# Patient Record
Sex: Male | Born: 1935 | Race: White | Hispanic: No | Marital: Married | Smoking: Former smoker
Health system: Southern US, Community
[De-identification: ages and names within clinical notes are randomized; demographics above are authoritative.]

## PROBLEM LIST (undated history)

## (undated) DIAGNOSIS — I639 Cerebral infarction, unspecified: Secondary | ICD-10-CM

## (undated) DIAGNOSIS — Z7901 Long term (current) use of anticoagulants: Secondary | ICD-10-CM

## (undated) DIAGNOSIS — I4891 Unspecified atrial fibrillation: Secondary | ICD-10-CM

## (undated) DIAGNOSIS — C801 Malignant (primary) neoplasm, unspecified: Secondary | ICD-10-CM

## (undated) DIAGNOSIS — I10 Essential (primary) hypertension: Secondary | ICD-10-CM

## (undated) HISTORY — DX: Long term (current) use of anticoagulants: Z79.01

## (undated) HISTORY — DX: Unspecified atrial fibrillation: I48.91

## (undated) HISTORY — PX: APPENDECTOMY: SHX54

## (undated) HISTORY — PX: CARDIAC CATHETERIZATION: SHX172

## (undated) HISTORY — DX: Cerebral infarction, unspecified: I63.9

## (undated) HISTORY — DX: Essential (primary) hypertension: I10

---

## 2004-03-05 ENCOUNTER — Encounter: Admission: RE | Admit: 2004-03-05 | Discharge: 2004-03-05 | Payer: Self-pay | Admitting: Family Medicine

## 2004-11-10 ENCOUNTER — Encounter: Admission: RE | Admit: 2004-11-10 | Discharge: 2004-11-10 | Payer: Self-pay | Admitting: Family Medicine

## 2005-01-25 ENCOUNTER — Ambulatory Visit: Payer: Self-pay | Admitting: Physical Medicine & Rehabilitation

## 2005-01-25 ENCOUNTER — Inpatient Hospital Stay (HOSPITAL_COMMUNITY): Admission: RE | Admit: 2005-01-25 | Discharge: 2005-01-28 | Payer: Self-pay | Admitting: Orthopaedic Surgery

## 2005-03-21 ENCOUNTER — Encounter: Admission: RE | Admit: 2005-03-21 | Discharge: 2005-03-21 | Payer: Self-pay | Admitting: Family Medicine

## 2005-03-21 ENCOUNTER — Encounter (INDEPENDENT_AMBULATORY_CARE_PROVIDER_SITE_OTHER): Payer: Self-pay | Admitting: *Deleted

## 2005-03-21 ENCOUNTER — Ambulatory Visit (HOSPITAL_COMMUNITY): Admission: EM | Admit: 2005-03-21 | Discharge: 2005-03-22 | Payer: Self-pay | Admitting: General Surgery

## 2006-05-02 ENCOUNTER — Inpatient Hospital Stay (HOSPITAL_COMMUNITY): Admission: EM | Admit: 2006-05-02 | Discharge: 2006-05-09 | Payer: Self-pay | Admitting: Emergency Medicine

## 2006-05-03 ENCOUNTER — Encounter (INDEPENDENT_AMBULATORY_CARE_PROVIDER_SITE_OTHER): Payer: Self-pay | Admitting: Interventional Cardiology

## 2006-05-22 ENCOUNTER — Encounter: Admission: RE | Admit: 2006-05-22 | Discharge: 2006-08-20 | Payer: Self-pay | Admitting: Neurology

## 2007-07-11 ENCOUNTER — Encounter: Admission: RE | Admit: 2007-07-11 | Discharge: 2007-07-11 | Payer: Self-pay | Admitting: Family Medicine

## 2009-10-12 ENCOUNTER — Emergency Department (HOSPITAL_COMMUNITY): Admission: EM | Admit: 2009-10-12 | Discharge: 2009-10-12 | Payer: Self-pay | Admitting: Emergency Medicine

## 2010-03-25 LAB — PROTIME-INR
INR: 2.63 — ABNORMAL HIGH (ref 0.00–1.49)
Prothrombin Time: 28.2 seconds — ABNORMAL HIGH (ref 11.6–15.2)

## 2010-03-25 LAB — COMPREHENSIVE METABOLIC PANEL
ALT: 31 U/L (ref 0–53)
AST: 33 U/L (ref 0–37)
Albumin: 3.8 g/dL (ref 3.5–5.2)
Alkaline Phosphatase: 48 U/L (ref 39–117)
BUN: 10 mg/dL (ref 6–23)
CO2: 30 mEq/L (ref 19–32)
Calcium: 9 mg/dL (ref 8.4–10.5)
Chloride: 103 mEq/L (ref 96–112)
Creatinine, Ser: 0.86 mg/dL (ref 0.4–1.5)
GFR calc Af Amer: >60 mL/min (ref >60)
GFR calc non Af Amer: >60 mL/min (ref >60)
Glucose, Bld: 89 mg/dL (ref 70–99)
Potassium: 3.8 mEq/L (ref 3.5–5.1)
Sodium: 138 mEq/L (ref 135–145)
Total Bilirubin: 0.5 mg/dL (ref 0.3–1.2)
Total Protein: 7.6 g/dL (ref 6.0–8.3)

## 2010-03-25 LAB — POCT I-STAT, CHEM 8
BUN: 12 mg/dL (ref 6–23)
Calcium, Ion: 1.17 mmol/L (ref 1.12–1.32)
Chloride: 101 mEq/L (ref 96–112)
Creatinine, Ser: 0.9 mg/dL (ref 0.4–1.5)
Glucose, Bld: 91 mg/dL (ref 70–99)
HCT: 45 % (ref 39.0–52.0)
Hemoglobin: 15.3 g/dL (ref 13.0–17.0)
Potassium: 3.8 mEq/L (ref 3.5–5.1)
Sodium: 140 mEq/L (ref 135–145)
TCO2: 31 mmol/L (ref 0–100)

## 2010-03-25 LAB — POCT CARDIAC MARKERS
CKMB, poc: 3.1 ng/mL (ref 1.0–8.0)
CKMB, poc: 3.1 ng/mL (ref 1.0–8.0)
CKMB, poc: 4.1 ng/mL (ref 1.0–8.0)
Myoglobin, poc: 88.3 ng/mL (ref 12–200)
Myoglobin, poc: 98.1 ng/mL (ref 12–200)
Myoglobin, poc: 99.6 ng/mL (ref 12–200)
Troponin i, poc: <0.05 ng/mL (ref 0.00–0.09)
Troponin i, poc: <0.05 ng/mL (ref 0.00–0.09)
Troponin i, poc: <0.05 ng/mL (ref 0.00–0.09)

## 2010-03-25 LAB — CBC
HCT: 43.3 % (ref 39.0–52.0)
Hemoglobin: 14.6 g/dL (ref 13.0–17.0)
MCH: 30.5 pg (ref 26.0–34.0)
MCHC: 33.7 g/dL (ref 30.0–36.0)
MCV: 90.4 fL (ref 78.0–100.0)
Platelets: 145 10*3/uL — ABNORMAL LOW (ref 150–400)
RBC: 4.79 MIL/uL (ref 4.22–5.81)
RDW: 13.5 % (ref 11.5–15.5)
WBC: 4.9 10*3/uL (ref 4.0–10.5)

## 2010-05-01 ENCOUNTER — Emergency Department (HOSPITAL_COMMUNITY): Payer: Medicare Other

## 2010-05-01 ENCOUNTER — Emergency Department (HOSPITAL_COMMUNITY)
Admission: EM | Admit: 2010-05-01 | Discharge: 2010-05-01 | Disposition: A | Discharge status: Home / Self Care | Payer: Medicare Other | Attending: Emergency Medicine | Admitting: Emergency Medicine

## 2010-05-01 DIAGNOSIS — IMO0002 Reserved for concepts with insufficient information to code with codable children: Secondary | ICD-10-CM | POA: Insufficient documentation

## 2010-05-01 DIAGNOSIS — Z7982 Long term (current) use of aspirin: Secondary | ICD-10-CM | POA: Insufficient documentation

## 2010-05-01 DIAGNOSIS — H5710 Ocular pain, unspecified eye: Secondary | ICD-10-CM | POA: Insufficient documentation

## 2010-05-01 DIAGNOSIS — Y93H2 Activity, gardening and landscaping: Secondary | ICD-10-CM | POA: Insufficient documentation

## 2010-05-01 DIAGNOSIS — Y9229 Other specified public building as the place of occurrence of the external cause: Secondary | ICD-10-CM | POA: Insufficient documentation

## 2010-05-01 DIAGNOSIS — Z79899 Other long term (current) drug therapy: Secondary | ICD-10-CM | POA: Insufficient documentation

## 2010-05-01 DIAGNOSIS — S0003XA Contusion of scalp, initial encounter: Secondary | ICD-10-CM | POA: Insufficient documentation

## 2010-05-01 DIAGNOSIS — S02109A Fracture of base of skull, unspecified side, initial encounter for closed fracture: Secondary | ICD-10-CM | POA: Insufficient documentation

## 2010-05-01 DIAGNOSIS — I10 Essential (primary) hypertension: Secondary | ICD-10-CM | POA: Insufficient documentation

## 2010-05-01 DIAGNOSIS — R1110 Vomiting, unspecified: Secondary | ICD-10-CM | POA: Insufficient documentation

## 2010-05-01 DIAGNOSIS — S0010XA Contusion of unspecified eyelid and periocular area, initial encounter: Secondary | ICD-10-CM | POA: Insufficient documentation

## 2010-05-01 DIAGNOSIS — W010XXA Fall on same level from slipping, tripping and stumbling without subsequent striking against object, initial encounter: Secondary | ICD-10-CM | POA: Insufficient documentation

## 2010-05-01 DIAGNOSIS — Z7901 Long term (current) use of anticoagulants: Secondary | ICD-10-CM | POA: Insufficient documentation

## 2010-05-01 DIAGNOSIS — R220 Localized swelling, mass and lump, head: Secondary | ICD-10-CM | POA: Insufficient documentation

## 2010-05-01 LAB — CBC
Hemoglobin: 14.9 g/dL (ref 13.0–17.0)
MCH: 30.5 pg (ref 26.0–34.0)
MCHC: 34.4 g/dL (ref 30.0–36.0)
Platelets: 141 10*3/uL — ABNORMAL LOW (ref 150–400)
RDW: 13.7 % (ref 11.5–15.5)

## 2010-05-01 LAB — POCT I-STAT, CHEM 8
BUN: 19 mg/dL (ref 6–23)
Calcium, Ion: 1.16 mmol/L (ref 1.12–1.32)
Chloride: 104 mEq/L (ref 96–112)
Glucose, Bld: 178 mg/dL — ABNORMAL HIGH (ref 70–99)
HCT: 46 % (ref 39.0–52.0)
Hemoglobin: 15.6 g/dL (ref 13.0–17.0)
Potassium: 4 mEq/L (ref 3.5–5.1)
TCO2: 27 mmol/L (ref 0–100)

## 2010-05-01 LAB — DIFFERENTIAL
Basophils Absolute: 0 10*3/uL (ref 0.0–0.1)
Basophils Relative: 0 % (ref 0–1)
Eosinophils Absolute: 0 10*3/uL (ref 0.0–0.7)
Eosinophils Relative: 0 % (ref 0–5)
Monocytes Absolute: 0.3 10*3/uL (ref 0.1–1.0)
Monocytes Relative: 4 % (ref 3–12)
Neutro Abs: 6.7 10*3/uL (ref 1.7–7.7)

## 2010-05-01 LAB — PROTIME-INR
INR: 2.31 — ABNORMAL HIGH (ref 0.00–1.49)
Prothrombin Time: 25.5 seconds — ABNORMAL HIGH (ref 11.6–15.2)

## 2010-05-28 NOTE — Consult Note (Signed)
[name] NO.:  [id number]   MEDICAL RECORD NO.:  [id number]          PATIENT TYPE:  INP   LOCATION:  3708                         FACILITY:  [facility]   PHYSICIAN:  Lyn Records, M.D.   DATE OF BIRTH:  1930-08-14   DATE OF CONSULTATION:  05/03/2006  DATE OF DISCHARGE:                                 CONSULTATION   REASON FOR CONSULTATION:  New-onset atrial fibrillation, elevated  cardiac markers.   CONCLUSION:  1. Paroxysmal atrial fibrillation with spontaneous reversion after      initiation of calcium channel and beta blocker therapy May 02, 2006.      a.     Atrial fibrillation associated with severe dyspnea, chest       pain and elevated cardiac markers with peak troponin of 0.1 and       mildly elevated CK-MBs.  2. Elevated cardiac markers as described above in the setting of      atrial fibrillation.  3. Probable new-onset angina.  The patient gives a 61-month history of      exertional midsternal tightness that is relieved by rest.  4. History of hypertension, degree of control was uncertain.  5. Diverticulosis.  6. Prior history of hip surgery.   RECOMMENDATIONS:  1. Agree with serial markers  2. Echocardiogram to rule out pericardial disease, hypertrophic      cardiomyopathy or severe LVH, other potential structural      abnormalities that could lead to atrial fibrillation.  This test      will also be helpful to exclude regional wall motion abnormality.  3. Given the scenario, ie., antecedent history of exertional angina,      angina with atrial fibrillation and rapid rate, and abnormal      cardiac markers, I believe coronary angiography is most direct way      to evaluate the patient's coronary system and to help guide      therapy.  4. Agree with beta blocker therapy.  5. Agree with antithrombotic therapy until further data is available.      At the least the patient will be discharged home on aspirin and we      may decide  to use Coumadin therapy after we evaluate his coronaries      depending upon findings.  6. Blood pressure control with optimization of beta blocker and      diuretic therapy.  7. Statin therapy for management of hyperlipidemia.   COMMENT:  The patient is 14 and was admitted to the hospital on May 02, 2006 because of the sudden onset of chest discomfort that was  severe.  This occurred while he was at [facility] right after he had  eaten.  He has had several types of chest discomfort over the past 3  months.  This discomfort was a new type discomfort and did not have  relief with antacid therapy.  This therefore caused him to go to the  office of Dr. Manus Gunning where an EKG documented that he was in atrial  fibrillation  with a rapid rate.  He states that while in the process of  getting to Dr. Randel Books office he became extremely short of breath and  was having pain in excess of 10 of 10.  He did not feel better until  sometime last evening after intravenous therapies were started to slow  his heart rate.  He is painfree now and had no recurrence of discomfort  in the chest after the therapies were started.   Other types of discomfort that the patient has had includes a burning  discomfort that usually is relieved by TUMS.  TUMS did not work  yesterday.  These episodes are not usually exertional related.   He has also been recently having a discomfort in chest that he describes  as a tightness and is brought on by heavy exertion.  This has been  occurring only for 3 months.  Two specific episodes have occurred with  moderate to heavy exertion including one episode occurring while  changing a tire.  If he rests the discomfort resolves after several  minutes.  He has never had this discomfort to occur at rest.   MEDICATIONS ON ADMISSION:  1. Aspirin 81 mg per day.  2. Calcium 600 mg twice day.  3. Maxzide 37.5/25 mg daily.  4. Multivitamin one per day.   ALLERGIES:  None  known.   SIGNIFICANT MEDICAL PROBLEMS:  1. Hypertension.  2. Diverticulitis.  3. Indigestion.  4. Degenerative joint disease.  5. No previous surgeries.   FAMILY HISTORY:  No significant history of coronary atherosclerosis in  family history.  The patient's mother died of cerebral aneurysm.  He  does not know any of the history on his father.   REVIEW OF SYSTEMS:  The patient has not had orthopnea, PND, but he has  been having exertional chest tightness.  He sleeps on two pillows.  He  has never fainted, he has never had tachy palpitations, did not have  tachy palpitations with the presenting complaints.   PHYSICAL EXAMINATION:  GENERAL:  The patient in no acute distress.  He  is lying comfortably in bed. There is no tachypnea.  VITAL SIGNS:  Blood pressure is 115/63, heart rate 57.  NECK:  Neck veins not distended.  No carotid bruits.  LUNGS:  Clear to auscultation and percussion.  HEART:  No gallop, rub, click, murmur.  ABDOMEN:  Soft.  Bowel sounds normal.  EXTREMITIES:  No edema.  Femoral pulses 2+, posterior tibial pulses 2+,  radial pulses 2+.  NEUROLOGICAL:  Exam reveals no motor or sensory deficits.   EKG during atrial fibrillation reveals an extremely rapid rate of 160-  170 beats per minute with marked ST abnormality.  EKG post conversion  reveals normal sinus rhythm.  No ischemic ST-T wave changes.   LABORATORY DATA:  Include BUN and creatinine of 15 and 1.15, potassium  3.6.  BNP 44.  Initial troponin I 0.0, second 0.10, third 0.06.  Initial  CK-MB 6.4, second 4.6. Hemoglobin 13.6, platelet count 129. Lipid  profile has not been performed.   DISCUSSION:  The patient now feels great.  He was admitted with atrial  fib with rapid rate.  The atrial fibrillation resolved spontaneously  after initiation of beta blocker and calcium channel blocker therapy. The positive enzymes could be related to increase demand in a patient  with a history of hypertension, possible LVH  and demand/supply mismatch.  However, given the patient's history of exertional angina for 3 months  prior to  this.  I am concerned he may have underlying significant  coronary disease.  I believe the most direct way to evaluate the  patient's coronary anatomy and help establish treatment plan is to  perform coronary angiography.   At this point the patient's echo is structurally normal and we do not  find significant coronary disease.  I do not think that Coumadin therapy  will be indicated.  If he redevelops atrial fibrillation on beta blocker  therapy with good blood pressure control then long-term Coumadin therapy  would be necessary.      Lyn Records, M.D.  Electronically Signed     HWS/MEDQ  D:  05/03/2006  T:  05/03/2006  Job:  [id number]   cc:   Bryan Lemma. Manus Gunning, M.D.

## 2010-05-28 NOTE — Cardiovascular Report (Signed)
NAMEPAXTON, BINNS NO.:  [id number]   MEDICAL RECORD NO.:  [id number]          PATIENT TYPE:  INP   LOCATION:  3708                         FACILITY:  [facility]   PHYSICIAN:  Lyn Records, M.D.   DATE OF BIRTH:  02/29/1930   DATE OF PROCEDURE:  05/04/2006  DATE OF DISCHARGE:                            CARDIAC CATHETERIZATION   INDICATIONS FOR PROCEDURE:  The patient presented with severe chest  discomfort and atrial fibrillation with rapid ventricular response; and  ultimately had mild elevation in cardiac markers.  For the 3 months  preceding this particular episode, he had at least two episodes of  exertional chest tightness that was relieved with rest.  His underlying  risk factors include age, hypertension, and hyperlipidemia.   PROCEDURE PERFORMED:  1. Left heart cath.  2. Selective coronary angiogram.  3. Left ventriculography.   DESCRIPTION:  After informed consent a 6-French sheath was placed in the  right femoral artery using the modified Seldinger technique. A 6-French  A2 multipurpose catheter was then used for hemodynamic recordings, left  ventriculography by hand injection, and selective left-and-right  coronary angiography.  We were unable to cannulate the right coronary  primarily because of tortuosity in the iliac that prevented catheter  torquing.  We tried a no-torque right catheter through the stand-alone  sheath and this would not engage in the right coronary.  We ultimately  exchanged the stand-alone sheath for a 20-cm long sheath to get above  the tortuosity in the iliac. This allowed catheter torquing; and the  right coronary was easily cannulated with a Judkins right 6-French  catheter.  The patient tolerated the procedure without complications; 2  mg of IV Versed was administered for sedation before beginning the  procedure.  No complications occurred.   RESULTS   I. HEMODYNAMIC DATA:  A.  Aortic pressure 116/51 mmHg  B.  Left  ventricular pressure 116/10 mmHg.   II. LEFT VENTRICULOGRAPHY:  Left ventricular cavity size and systolic  function are normal.  Ejection fraction is 65%.  No mitral regurgitation  is noted.   III. CORONARY ANGIOGRAPHY:  A.  LEFT MAIN CORONARY:  The left main is a  large vessel widely patent; no evidence of plaquing and no calcification  noted.  B.  LEFT ANTERIOR DESCENDING CORONARY:  The LAD is ectatic appearing in  the proximal segment.  Just prior to the origin of the first septal  perforator and diagonal, there is an eccentric less than 20% lesion.  The LAD is large, wraps around left ventricular apex.  The large first  diagonal is without evidence of plaquing or obstruction.  C.  CIRCUMFLEX ARTERY:  The circumflex coronary artery is normal.  It  gives origin to a large first obtuse marginal that trifurcates on the  left lateral wall.  The vessel contains moderate tortuosity.  The  continuation of circumflex was large giving origin to a small distal  circumflex marginal, and a large left atrial recurrent arises from the  distal circumflex as well.  The entire circumflex system is widely  patent.  D.  RIGHT CORONARY:  The right coronary artery was initially difficult  to cannulate because of difficulty with torquing.  The mid vessel  contains less than 30% plaquing that is eccentric.  The PDA is large.  Two left ventricular branches arise from the right coronary as above.  The right coronary is essentially normal.   CONCLUSIONS:  1. Widely patent coronaries without any evidence of significant      obstructive disease.  Minimal plaquing is noted.  2. Normal left ventricular function.  3. Mild ectasia in the proximal left anterior descending.   PLAN:  1. The patient's medication regimen should be changed to include beta      blocker therapy to slow rate if he develops recurrent atrial      fibrillation.  2. His blood pressure regimen should contain ACE inhibitor, ARB       therapy, which may have some protective effect against      redevelopment of atrial fibrillation.  3. The patient's risk for systemic embolization is relatively low; and      after this one brief episode of atrial fibrillation, I believe,      aspirin therapy is reasonable.  If he has recurrent atrial      fibrillation, Coumadin should be started.      Lyn Records, M.D.     HWS/MEDQ  D:  05/04/2006  T:  05/04/2006  Job:  [id number]   cc:   Bryan Lemma. Manus Gunning, M.D.

## 2010-05-28 NOTE — H&P (Signed)
[name], [name]                 ACCOUNT NO.:  [id number]   MEDICAL RECORD NO.:  [id number]          PATIENT TYPE:  OIB   LOCATION:  6735                         FACILITY:  [facility]   PHYSICIAN:  Adolph Pollack, M.D.DATE OF BIRTH:  1930-05-15   DATE OF ADMISSION:  03/21/2005  DATE OF DISCHARGE:                                HISTORY & PHYSICAL   CHIEF COMPLAINT:  Abdominal pain.   HISTORY OF PRESENT ILLNESS:  Mr. Bedel is a 77 year old male who awoke  yesterday with some upper abdominal/lower chest wall pain, pressure type,  eventually radiated down to the lower abdomen and became worse. He forced  himself to have a BM but did not get relief. He continued to have the pain  and presented to Dr. Manus Gunning, who evaluated him and noted he had a white  cell count of 22,000. He was sent for a CT scan of an abdomen as an  outpatient and noted to have findings consistent with appendicitis. I was  subsequently asked to see him. He denies nausea, vomiting, fever, or chills.   PAST MEDICAL HISTORY:  1.  Degenerative joint disease.  2.  He states he is taking an antihypertensive but not sure if he is      hypertensive or not.   PREVIOUS OPERATIONS:  1.  Right hip replacement.  2.  Left thumb surgery.   ALLERGIES:  None.   MEDICATIONS:  Triamterene/hydrochlorothiazide, baby aspirin a day,  glucosamine and chondroitin, calcium, vitamins.   SOCIAL HISTORY:  He is a former smoker. Denies alcohol use. Married and with  his wife. Still tries to do some work at times.   REVIEW OF SYSTEMS:  CONSTITUTIONAL:  No weight loss. CARDIOVASCULAR:  Denies  any heart disease or chest pain. PULMONARY:  Denies shortness of breath,  pneumonia, COPD, or asthma. GI:  Denies peptic ulcer disease, hepatitis,  diverticulitis, hematochezia. GU:  He denies any kidney stones. Occasionally  he has to strain to void at times. ENDOCRINE:  Denies diabetes, thyroid  disease, or hypercholesterolemia. NEUROLOGIC:   Denies stroke or seizures.  HEMATOLOGIC:  Denies any bleeding disorders or blood clots. He is not sure  if he has had a transfusion in the past or not.   PHYSICAL EXAMINATION:  GENERAL:  Well-developed, well-nourished male.  Appears to be slightly uncomfortable.  VITAL SIGNS:  He is afebrile, normal blood pressure and heart rate at  present.  EYES:  Extraocular muscles intact, no icterus. He does wear glasses.  NECK:  Supple without masses or obvious thyroid enlargement.  RESPIRATORY:  Breath sounds equal and clear, respirations unlabored.  CARDIOVASCULAR:  Demonstrates a regular rate and rhythm. I do not hear a  murmur.  ABDOMEN:  Soft. There is right lower quadrant tenderness and guarding to  palpation and percussion. No obvious mass. Hypoactive bowel sounds noted.  EXTREMITIES:  He has got good range of motion. No clubbing, cyanosis, or  edema.  SKIN:  No jaundice.   DATA:  Remarkable for white cell count of 22,000. CT scan was reviewed.   IMPRESSION:  Acute appendicitis.  PLAN:  Laparoscopic, possible open appendectomy. I did discuss the  procedure, rationale, and risks with him. The risks include but not limited  to bleeding, infection, would healing problems, anesthesia, accidental  damage to intraabdominal organs. He seemed to understand this and agrees to  proceed.      Adolph Pollack, M.D.  Electronically Signed     TJR/MEDQ  D:  03/21/2005  T:  03/22/2005  Job:  [id number]   cc:   Bryan Lemma. Manus Gunning, M.D.  Fax: [phone]

## 2010-05-28 NOTE — Op Note (Signed)
NAMEEINAR, NOLASCO NO.:  [id number]   MEDICAL RECORD NO.:  [id number]          PATIENT TYPE:  INP   LOCATION:  2854                         FACILITY:  [facility]   PHYSICIAN:  Vanita Panda. Magnus Ivan, M.D.DATE OF BIRTH:  08-01-1930   DATE OF PROCEDURE:  01/25/2005  DATE OF DISCHARGE:                                 OPERATIVE REPORT   PREOPERATIVE DIAGNOSIS:  Right hip severe osteoarthritis/degenerative joint  disease.   POSTOPERATIVE DIAGNOSIS:  Right hip severe osteoarthritis/degenerative joint  disease.   PROCEDURE:  Right total hip arthroplasty.   COMPONENTS:  DePuy Summit tapered hip stem with hydroxyapatite coating size  7 high offset, Pinnacle acetabular cup size 56, polyethylene Pinnacle  Marathon acetabular liner with 10 degree hood, size 36 metal femoral head (-  2).   SURGEON:  Vanita Panda. Magnus Ivan, MD   ANESTHESIA:  General.   ANTIBIOTICS:  1 gram IV Ancef.   BLOOD LOSS:  400 mL.   COMPLICATIONS:  None.   INDICATIONS:  Briefly Mr. Toney is a very pleasant 77 year old with known  severe osteoarthritis involving his right hip.  He had no discernible leg  length discrepancy but with the pain in his hip, it has certainly affected  his activities of daily living.  He has failed conservative treatment with  anti-inflammatories and x-rays showed severe acetabular and femoral head  wear and it was recommended that he undergo total hip replacement.  He did  want to proceed with surgery because he is quite an active individual and  this is certainly hurting him on a daily basis at this point.  The risks,  benefits have been explained him and well understood and he was cleared from  a medical standpoint to proceed with surgery. I did explain the risks of  blood clots, pulmonary embolus, blood loss, nerve injury and even death and  he agreed to proceed with surgery.   PROCEDURE DESCRIPTION:  After informed consent was obtained and appropriate  right leg was marked, Mr. Chiquito was brought to the operating room and  placed supine on the operating table. General anesthesia was obtained. The  knee was turned to a lateral decubitus position with a right hip up and  appropriate padding was placed with an axillary roll as well as padding  underneath his down left leg at the knee and ankle.  His operative leg was  then prepped from above the hip down to the ankle with DuraPrep and then  sterile stockinette and sterile drapes were utilized.  Of note hip  positioners were used in the front and back with appropriate padding as  well.  A lateral incision was made just over the level of the greater  trochanter and __________ anteriorly for an anterior lateral approach to the  hip.  After the skin and soft tissue were divided, the iliotibial band and  tensor fascia lata were identified. These were then divided sharply directly  over the greater trochanter to reveal the vastus lateralis and the vastus  ridge.  The gluteus medius was likewise identified.  The gluteus medius was  then  taken off the vastus ridge of the most distal one third of the gluteus  medius and minimus.  Those were taken and reflected anteriorly as a sleeve  of tissue with separate tendons.  The hip capsule was then encountered and  was divided in a T-type fashion revealing abundant osteophytes and there is  certainly an effusion of the joint. There was certainly difficult to  dislocate his joint secondary to soft tissue contractures, so I made a very  high neck cut using Homan retractors for protection to expose the head.  A  corkscrew was then utilized to remove the head in its entirety.  The  acetabulum was then cleaned of debris and then I was able to make separate  and neck approximately a fingerbreadth proximal to the lesser trochanter.  The acetabulum was then again cleaned of debris and a sharp knife was used  to remove the labrum.  The acetabulum was then reamed in  5-mm increments up  to 55 mm.  I felt that I got concentric reaming and a good rim fit, so I  chose a Pinnacle marathon acetabular liner with porous coating and was able  to hammer this into place.  I used guides to gain my anteversion as well as  to allow for the cup placement to be in slightly less than a 45 degrees  angle.  I trial liner was then placed in the acetabular cup and then I was  able to bring the leg off the table in a flexed and externally rotated  position and put this in a leg bag to allow for exposure of the femur and  canal finder was then used and an initiating reamer to open up the femoral  canal.  A box-cutter was likewise used and then the femoral canal was reamed  using first the size 01 reamer, then 1-2 followed by the 2-3 reamer all the  way up to the size 7-8 reamer. I then lateralized this with a lateralizing  reamer and then started with a #1 broach and broached up to a size 7. Once I  got a good fit with the size 7 broach and this was a solid fit, I used a  calcar planer to plane at the level of the neck cut.  I then used a high  offset neck and a standard 36 size ball and reduced the trial components and  I placed the hip through range of motion and this was found to be stable in  flexion, extension,  internal and external rotation and not tight.  It had  good shuck.  These were the components that I chose and all the trial  components were removed. I then copiously irrigated the hip socket and joint  and wound with pulsatile lavage using 3 liters of pulsatile lavage solution.  The leg was then brought off the table and the final polyethylene acetabular  liner was inserted. I next inserted the real size 7 Summit tapered stem it  was fully porous coated with hydroxyapatite coating.  I pounded this into  place with a hammer and got a good fit.  This stem was slightly proud so I used the next size down ball and a reduced the hip and put it through a  range of  motion and thus I was able to finally select the final components,  a 36 mm -2 size ball.  Once the real cobalt chrome ball was placed. the hip  was reduced again put through  range of motion found to be stable. I then  irrigated the wound again, the hip capsule was closed with interrupted #1  Ethibond suture followed by reapproximating the gluteus medius and minimus  tendons to the vastus ridge and the greater trochanter using drill holes and  #1 Ethibond suture as well. The IT band was then closed with interrupted #1  Vicryl suture followed by 2-0 Vicryl in interrupted form in the subcutaneous  tissue and staples on the skin.  A well-padded sterile dressing was applied.  Prior to final closure, a medium Hemovac drain was placed. The patient was  then rolled into supine position. The leg lengths were felt to be near equal  as well. The patient was awakened, extubated and taken to recovery room in  stable condition. There were no complications and final counts were correct.           ______________________________  Vanita Panda. Magnus Ivan, M.D.     CYB/MEDQ  D:  01/25/2005  T:  01/26/2005  Job:  [id number]

## 2010-05-28 NOTE — H&P (Signed)
[name], [name]                 ACCOUNT NO.:  [id number]   MEDICAL RECORD NO.:  [id number]          PATIENT TYPE:  INP   LOCATION:  3708                         FACILITY:  [facility]   PHYSICIAN:  Michelene Gardener, MD    DATE OF BIRTH:  09/06/1930   DATE OF ADMISSION:  05/02/2006  DATE OF DISCHARGE:                              HISTORY & PHYSICAL   PRIMARY CARE PHYSICIAN:  Dr. Blair Heys.   CHIEF COMPLAINT:  Chest pain.   HISTORY OF PRESENT ILLNESS:  This is a 77 year old Caucasian male with  past medical history of hypertension presented with the above admission  complaint.  This patient has been in his regular state of health until  today when he started developing chest pain.  The chest pain was  nonspecific, described as pressure like pain which sometimes becomes  sharp.  It started in the left side of his chest and was moving across  his chest to the right side.  He had some radiation to his right  shoulder and right part of his forehead.  When it started it was very  severe and it was around 10/10.  It was associated with mild shortness  of breath and sweating.  He denied nausea, there were no palpitations,  and there is no vomiting.  He went to his primary physicians office  where an EKG was done on him and it showed atrial fibrillation with  rapid ventricular response which is new onset so the patient was sent to  the ER for further evaluation.  In the ER, he was given 20 mg IV of  Cardizem and then was started on a Cardizem drip.  Currently his rate is  better controlled and it is around 80s on 5 mcg of Cardizem drip.   PAST MEDICAL HISTORY:  1. Hypertension.  2. History of diverticulosis.   PAST SURGICAL HISTORY:  Denied.   ALLERGIES:  No known drug allergies.   CURRENT MEDICATIONS:  1. Aspirin 81 mg p.o. daily.  2. Calcium 600 mg p.o. twice daily.  3. Maxzide 37.5/25 mg 1 tablet p.o. daily.  4. Multivitamin 1 tablet p.o. daily.   SOCIAL HISTORY:  The patient  denies smoking; he quit smoking more than  50 years ago.  He denies alcohol and he denies recreational drugs.   FAMILY HISTORY:  His mother died of brain aneurysm.  There is no history  of coronary artery disease.   REVIEW OF SYSTEMS:  CONSTITUTIONAL:  Notable for fatigability.  EYES:  No blurred vision.  ENT:  No tinnitus is noted.  RESPIRATORY:  No cough,  no wheezes.  CARDIOVASCULAR:  Positive for chest pain and shortness of  breath.  GI:  No nausea, no vomiting, no diarrhea.  GU:  No dysuria and  no hematuria.  ENDOCRINE:  No polyuria and no nocturia.  HEMATOLOGY:  No  bruises, no bleeding.  ID:  No rash, no lesions.  NEURO:  No numbness or  tingling.  The rest of the symptoms are reviewed and they were negative.   PHYSICAL EXAMINATION:  VITAL SIGNS:  Temperature  is 98.6, blood pressure  is 108/56, pulse 56, respiratory rate 20.  GENERAL APPEARANCE:  This is an elderly Caucasian male who is not in  acute distress.  HEENT:  Conjunctivae showed no pallor and no erythema.  Pupils equal,  round and reactive to light and accommodation.  There is no ptosis.  Hearing is intact.  There is no ear discharge or infection.  There is no  nose discharge, infection or bleeding.  Oral mucosa is dry, no  pharyngeal erythema.  NECK:  Supple, no JVD, no carotid bruits, no lymphadenopathy, no  thyromegaly enlargement, no tenderness.  CARDIOVASCULAR:  S1 and S2 are irregular.  There are no murmurs, no  gallops, and no thrills.  RESPIRATORY:  The patient is breathing 16-18.  There is no use of  accessory muscles, no intercostal retractions, no dullness, no rales, no  rhonchi, and no wheezes.  ABDOMEN:  The abdomen is soft, nondistended, no tenderness, no  hepatosplenomegaly, positive bowel sounds, umbilicus is central.  LOWER EXTREMITIES:  No edema, no rash, and no varicose veins.  SKIN:  No rash and no erythema.  NEURO:  Cranial nerves are intact from II-XII.  There are no motor or  sensory  deficits.   LABORATORY DATA:  Sodium 132, potassium 3.6, chloride 103, BUN 18,  creatinine 1.3.  CK-MB is 4.2 and troponin less than 0.5.  WBC 7.8,  hemoglobin 14.3, hematocrit 41.2, MCV 87.6, platelet count is 175.  EKG  is a-fib with RVR.  No evidence of acute ischemia.  Chest x-ray showed  no acute disease.   IMPRESSION AND ASSESSMENT:  1. Chest pain.  Most likely  from the patient because of the atrial      fibrillation.  I will admit this patient to the telemetry floor to      rule out myocardial infarction.  I will get three sets of troponin      and cardiac enzymes.  I will start him on aspirin and beta-blocker.      I will also get an echocardiogram to assess any possible wall      motion abnormalities.  Will get Cardiology consultation in the      morning for possible stress test before discharge.  2. History of atrial fibrillation with rapid ventricular response.      The patient was started on a Cardizem drip.  Currently his heart      rate is stable.  I will try to go without his Cardizem drip.      Meanwhile, will start him on metoprolol b.i.d.  I will also      anticoagulate this patient; we will start him on heparin IV.  Will      also start him on Coumadin p.o. and dose to be regulated by      pharmacy for dosing.  The target INR will be 2 to 3.  Will get      three sets of troponin and cardiac enzymes to rule out any      underlying ischemic cause for his a-fib.  We will also get an      echocardiogram for further evaluation.  3. Hypertension.  Will continue Maxzide and will continue him on      metoprolol and follow blood pressures.  4. Degenerative joint disease.  Will continue pain medication.   TOTAL ASSESSMENT TIME:  One hour.      Michelene Gardener, MD  Electronically Signed     NAE/MEDQ  D:  05/02/2006  T:  05/03/2006  Job:  [id number]   cc:   Bryan Lemma. Manus Gunning, M.D.

## 2010-05-28 NOTE — Consult Note (Signed)
[name], [name] NO.:  [id number]   MEDICAL RECORD NO.:  [id number]          PATIENT TYPE:  INP   LOCATION:  3708                         FACILITY:  [facility]   PHYSICIAN:  Lyn Records, M.D.   DATE OF BIRTH:  04-Aug-1930   DATE OF CONSULTATION:  DATE OF DISCHARGE:                                 CONSULTATION   Audio too short to transcribe (less than 5 seconds)      Lyn Records, M.D.     HWS/MEDQ  D:  05/03/2006  T:  05/03/2006  Job:  [id number]

## 2010-05-28 NOTE — Discharge Summary (Signed)
[name]                 ACCOUNT NO.:  [id number]   MEDICAL RECORD NO.:  [id number]          PATIENT TYPE:  INP   LOCATION:  3708                         FACILITY:  [facility]   PHYSICIAN:  Kela Millin, M.D.DATE OF BIRTH:  September 14, 1930   DATE OF ADMISSION:  05/02/2006  DATE OF DISCHARGE:                               DISCHARGE SUMMARY   DISCHARGE DIAGNOSES:  1. Atrial fibrillation with rapid ventricular response, spontaneously      converted to normal sinus rhythm.  2. Left posterior cerebral artery embolic infarct, secondary to #1.  3. Hypertension.  4. History of degenerative joint disease.   PROCEDURES AND STUDIES:  1. Cardiac catheterization per Lyn Records, M.D., on May 04, 2006.  Widely patent coronaries without any evidence of significant      obstructive disease.  Minimal plaquing noted.  Normal left      ventricular function.  Mild ectasia in the proximal left anterior      descending.  2. CT scan of the head.  No acute abnormality.  3. MRI of the brain.  Acute ischemic area involving the left posterior      cerebral artery distribution.  Probably small vessel-type disease      changes supratentorially, and also in the occipital regions.  Old      lacune involving the right caudate head and also possibly the left      putamen.  Mild to moderate thickening of the mucosa in the ethmoid      and the frontal sinuses.  MRA shows truncation of the distal left      posterior cerebral artery at the P2-P3 region, probably the      etiology of the patient's ischemic infarct.  No aneurysm seen.   CONSULTATIONS:  Cardiology, [facility], Dr. Verdis Prime.   HISTORY:  The patient is a 77 year old white male with past medical  history significant for hypertension, who presented with complaints of  chest pain.  The chest pain was described as a pressure-like pain which  was sometimes sharp, left precordial initially but also moving across  his chest to the right side at  times.  He had some radiation to his  right shoulder and the right part of his forehead.  He stated that it  was associated with shortness of breath and sweating and that it was  about a 10/10 in intensity at its worst.  The patient denied nausea,  palpitations, and no vomiting.  The patient went to his primary care  physician's office and an EKG there showed atrial fibrillation with a  rapid ventricular response, new-onset, and the patient was sent to the  ER.  In the ER the patient received a 20 mg IV bolus of Cardizem and  then was started on the drip.   PHYSICAL EXAMINATION:  VITAL SIGNS:  His physical exam upon admission as  per Dr. Arthor Captain revealed a temperature of 98.6 with a blood pressure of  108/56, pulse of 56, respiratory rate of 20.   The pertinent findings on exam:  NECK:  No  carotid bruits.  No JVD.  Supple.  No adenopathy and no  thyromegaly.  CARDIOVASCULAR:  Irregular and normal S1, S2.   The rest of his physical exam was reported to be within normal limits.   LABORATORY DATA:  Sodium 132 with a potassium of 3.6, chloride 103, BUN  18, creatinine 1.3.  His point of care markers were negative, white cell  count of 7.8, hemoglobin of 14.3, hematocrit of 41.2, MCV 87.6, platelet  count of 176.  An EKG showed atrial fibrillation with RVR and no  evidence of acute ischemia.  Chest x-ray showed no acute disease.   HOSPITAL COURSE:  Problem 1.  ATRIAL FIBRILLATION WITH RAPID VENTRICULAR RESPONSE:  As  discussed above, upon admission the patient was placed on a Cardizem  drip after he received a bolus.  Serial cardiac enzymes were done and  his troponins were noted to be elevated at 0.06 initially and then 0.1  and third set 0.08.  The patient had a 2 D echo done and it showed an  ejection fraction of 65%, overall left ventricular systolic function  normal, and no left ventricular regional wall motion abnormalities  noted.  While on the Cardizem drip, the patient  spontaneously converted  to normal sinus rhythm.  The patient was placed on oral Cardizem as well  as metoprolol.  The patient was also placed on anticoagulation for  atrial fibrillation upon admission.  Cardiology was consulted for  further recommendations and Dr. Katrinka Blazing saw the patient and a cardiac  catheterization was done on May 04, 2006, and the results are as  stated above:  Normal coronaries.  Following the procedure, the patient  developed numbness in his right leg and arm and also some slurred speech  as well as some clumsiness of his right.  A CT scan of his head was  ordered and it was negative.  An MRI was done, which revealed the  posterior circulation infarct as above.  The patient was switched from  Lovenox therapeutic dose to IV heparin once he developed the symptoms  and neurology was consulted and saw the patient and followed him while  in the hospital. PT/OT were also consulted and followed the patient  during his hospital stay.  Cardiology continued to follow the patient  and recommended discontinuing of the Cardizem and the patient was  maintained on metoprolol, and he remained in sinus rhythm for the rest  of his hospital stay.  He has been on anticoagulation, Coumadin was  added, and the goal range 2-3.  Per cardiology and neurology, okay to  discharge the patient on a bridge of Lovenox until his INR gets  therapeutic.  The patient is to follow up with the Coumadin clinic with  Dr. Verdis Prime as scheduled.   Problem 2.  LEFT POSTERIOR CEREBRAL ARTERY INFARCT:  As discussed above.  The patient had visual field cuts as a result of the stroke and was seen  by occupational therapy for assistance with visual training, and he is  to follow up without outpatient OT as well as physical therapy upon  discharge.  The patient will continue the Coumadin, with the bridge  Lovenox as discussed above until his INR becomes therapeutic, at which time the Lovenox will be  discontinued.  He is to follow up with Dr.  Pearlean Brownie with [facility] in 2-3 months.   Problem 3.  HYPERTENSION:  The patient's blood pressures were optimally  controlled on lisinopril, Toprol and hydrochlorothiazide during his  hospital stay.  Dr. Katrinka Blazing recommended that the patient stay on an ACE  inhibitor for his blood pressure control, which may have some protective  effect against re-development of atrial fibrillation.   DISCHARGE MEDICATIONS:  1. Coumadin 5 mg p.o. daily.  2. Hydrochlorothiazide 12.5 mg p.o. daily.  3. Toprol XL 25 mg p.o. daily.  4. Lisinopril 10 mg p.o. daily.  5. Aspirin 325 mg p.o. daily.  6. Multivitamin one p.o. daily.  7. Patient to discontinue Maxzide as instructed.   FOLLOW-UP CARE:  1. Dr. Michaelle Copas Coumadin clinic on May 12, 2006.  2. Dr. Michaelle Copas office visit on May 15, 2004, at 1:15 p.m.  3. Dr. Pearlean Brownie in 2-3 months.  Call [phone] for appointment.  4. Outpatient physical therapy and occupational therapy.  5. Dr. Manus Gunning in one week, patient to call for appointment.   DISCHARGE CONDITION:  Stable.      Kela Millin, M.D.  Electronically Signed     ACV/MEDQ  D:  05/09/2006  T:  05/09/2006  Job:  [id number]   cc:   Bryan Lemma. Manus Gunning, M.D.  Lyn Records, M.D.  Pramod P. Pearlean Brownie, MD

## 2010-05-28 NOTE — Op Note (Signed)
[name], [name]                 ACCOUNT NO.:  [id number]   MEDICAL RECORD NO.:  [id number]          PATIENT TYPE:  OIB   LOCATION:  6735                         FACILITY:  [facility]   PHYSICIAN:  Adolph Pollack, M.D.DATE OF BIRTH:  1930-06-12   DATE OF PROCEDURE:  03/21/2005  DATE OF DISCHARGE:  03/22/2005                                 OPERATIVE REPORT   PREOPERATIVE DIAGNOSIS:  Acute appendicitis.   POSTOPERATIVE DIAGNOSIS:  Acute appendicitis.   PROCEDURE:  Laparoscopic appendectomy.   SURGEON:  Adolph Pollack, M.D.   ANESTHESIA:  General.   INDICATIONS:  A 66-year male with a 24-hour history of abdominal pain that  has progressively worsened and migrated in the upper to lower abdomen.  CT  scan confirms appendicitis.  He is now brought to the operating room.   TECHNIQUE:  He was brought to the operating room, placed on the operating  table, and a general anesthetic was administered.  A Foley catheter was  placed in his bladder.  The abdominal wall was sterilely prepped and draped.  Dilute Marcaine solution was infiltrated in the subumbilical region.  A  subumbilical incision made through skin, subcutaneous tissue and fascia  until the peritoneal cavity was entered.  A pursestring suture of 0 Vicryl  was placed around the fascial edges.  A 12 mm trocar was placed into the  abdominal cavity and then the pneumoperitoneum was obtained with  insufflating CO2 gas.   Next the laparoscope was introduced.  He was placed in the Trendelenburg  position with the right side tilted slightly up.  A 5 mm trocar was placed  up just to the left of the lower midline and also one was placed in right  upper quadrant.  I then identified the appendix and noted it was acutely  inflamed with some fibrinous material around it and two adhesions between  the small bowel and the appendix, which I was able to separate bluntly.  I  then was able to grasp the appendix and retract it anteriorly.   The  mesoappendix was divided with a Harmonic scalpel down to the base of the  cecum.  Using the Endo-GIA stapler, I then amputated the appendix off the  cecum and placed it into an Endopouch bag.  It was subsequently removed  through the subumbilical port and the subumbilical trocar was replaced.   I inspected the area.  There was no evidence of perforation.  I copiously  irrigated out the right lower quadrant and pelvic regions and evacuated as  much fluid as possible.  I inspected the staple line and it was solid  without evidence of leakage or bleeding.  Following this, then I removed the  left lower quadrant trocar and no bleeding was noted.  The subumbilical  trocar was removed and the fascial defect closed under laparoscopic vision  by tightening up and tying down the pursestring suture.  The remaining CO2  gas was released as much as possible and the 5 mm trocar in the right lower  quadrant was removed.   The skin incisions were closed  with 4-0 Monocryl subcuticular stitches  followed by Steri-Strips and sterile dressings.  He tolerated the procedure  without any apparent complications and was taken to the recovery room in  satisfactory condition.      Adolph Pollack, M.D.  Electronically Signed     TJR/MEDQ  D:  03/21/2005  T:  03/23/2005  Job:  [id number]

## 2010-05-28 NOTE — Discharge Summary (Signed)
NAMEJOSEPHMICHAEL, LISENBEE NO.:  [id number]   MEDICAL RECORD NO.:  [id number]          PATIENT TYPE:  INP   LOCATION:  5016                         FACILITY:  [facility]   PHYSICIAN:  Vanita Panda. Magnus Ivan, M.D.DATE OF BIRTH:  1930-11-21   DATE OF ADMISSION:  01/25/2005  DATE OF DISCHARGE:  01/28/2005                                 DISCHARGE SUMMARY   ADMISSION DIAGNOSIS:  Right hip severe osteoarthritis and degenerative joint  disease.   DISCHARGE DIAGNOSIS:  Right hip severe osteoarthritis and degenerative joint  disease status post total hip replacement.   PROCEDURE:  Right total hip arthroplasty on January 25, 2005.   HOSPITAL COURSE:  Mr. Madole is a 77 year old with known, severe  degenerative joint disease of his right hip.  He had failed conservative  treatment with anti-inflammatories and NSAIDs, he is a [employer] information [employer] manager  and wished to proceed with total hip replacement.  Literature had been given  to him as well as understanding of the risks and benefits of surgery and he  wished to proceed with surgery.  On the day of admission, he was brought to  the operating room and a right total hip was performed without  complications.  For detailed description of the operation, please refer to  the dictated operative note in the patient's medical record.  Postoperatively, he was started on a protocol in the hospital's joint  replacement unit.  He began weight-bearing as tolerated and working with  physical therapy and occupational therapy.  By postoperative day three, he  was comfortable and was cleared for discharge to home from a therapy  standpoint.  He was tolerating oral pain medications as well as oral diet.  His incisions were found to be clean, dry, and intact.  It was felt he could  be discharged safely to home.   DISPOSITION:  To home.   DISCHARGE INSTRUCTIONS:  He will continue on Coumadin and have his INR drawn  bi-weekly and monitored from the  pharmacy.  He will also have home health  physical therapy and occupational therapy to work on his gait training,  balance, coordination, with ambulating as tolerated.  Follow up will be  established in the office in two weeks.           ______________________________  Vanita Panda. Magnus Ivan, M.D.     CYB/MEDQ  D:  03/04/2005  T:  03/04/2005  Job:  [id number]

## 2010-05-28 NOTE — Consult Note (Signed)
NAMERYLYN, RANGANATHAN                 ACCOUNT NO.:  [id number]   MEDICAL RECORD NO.:  [id number]          PATIENT TYPE:  INP   LOCATION:  3708                         FACILITY:  [facility]   PHYSICIAN:  Gustavus Messing. Orlin Hilding, M.D.DATE OF BIRTH:  06-Nov-1930   DATE OF CONSULTATION:  05/04/2006  DATE OF DISCHARGE:                                 CONSULTATION   STROKE CONSULTATION:   CHIEF COMPLAINT:  Right-sided numbness and clumsiness after cardiac  catheterization.   HISTORY OF PRESENT ILLNESS:  Mr. Pask is a 77 year old right-handed  white man who was admitted with chest pain two days ago.  He had a  cardiac catheterization this morning.  He was also noted to have AFib  with rapid ventricular response, was treated in the emergency room with  IV Cardizem and started on a Cardizem drip.  Rate was controlled.  He  then underwent a cardiac catheterization which showed all coronary  arteries normal.  He was treated medically, however he was given Versed  before the procedure and remained quite lethargic throughout the whole  procedure.  He later complained that the right arm was tingling and  weak.  Neurology was called.  It was unclear whether or not this was a  medication effect or an infarct.  The rapid response team came.  He had  a CT of the head ordered and this was negative.  He was to start back on  heparin if there was no bleed on the CT and this was done.  His symptoms  have been gradually resolving.  They did give him some Romazicon with  partial resolution, but according to his family, he is still a little  bit clumsier in the right hand than usual, cannot write his name, cannot  feed himself well, he dropped a fork a couple of times and still  complains of numbness of that right arm.   REVIEW OF SYSTEMS:  Out of a comprehensive 12-system review including  cardiovascular, pulmonary, neurologic, hematologic, endocrine, GI/GU,  musculoskeletal, ENT, reproductive, skin, mucosa,  pain, sleep and  nutrition, he complains of some shortness of breath, some arthritis  pains with a right hip replacement, some dentures and that is about it.   PAST MEDICAL HISTORY:  Significant for:  1. Hypertension.  2. Diverticulosis.  3. Remote appendectomy.  4. Right hip replacement.   PREOPERATIVE MEDICATIONS:  Were aspirin, calcium, Maxzide and a  multivitamin.  Since he has been in the hospital, his medications  include:   1. Metoprolol 25 mg daily.  2. Aspirin 81 mg a day.  3. He is on IV heparin currently.  4. Multivitamin once a day.  5. Triamterene/hydrochlorothiazide 37.5/25 once a day.  6. Potassium 20 mEq, this may have been a one time only.  7. Valium 5 mg on call for the study.  8. Benadryl 25 on call for the study.  9. Diltiazem 60 mg q.6 hours.  10.Morphine and Lovenox which is a p.r.n. order for standing orders.  11.Tylenol.  12.He did get Romazicon as noted.  13.I was given a history that he got  Versed, but it looks as though he      got Diazepam and Benadryl per the study.   ALLERGIES:  No known drug allergies.   SOCIAL HISTORY:  No current smoking, he quit more than 50 years ago, no  alcohol or recreational drugs.  He is married.   FAMILY HISTORY:  Positive for brain aneurysm.   OBJECTIVE ON EXAM:  VITAL SIGNS:  Temperature is 97.7, pulse 48, blood  pressure 129/68, 98% sat on 1.5 liters.  HEENT:  Head is normocephalic, atraumatic.  NECK:  Supple without bruits.  HEART:  Regular rate and rhythm.  LUNGS:  Clear to auscultation.  NEUROLOGICAL EXAM:  He is awake.  He is oriented to age and month, but  he has some difficulty getting that out.  He seems to have some trouble  with language.  He is somewhat illiterate, he went through the fourth  grade without learning how to read well.  He has to spell words out so  it is difficult when I try to get him to read a sentence, he had  difficulty with that.  However, he also had trouble naming pictures.   He  could not think of the word for cactus, but called it a flower, also had  some problems with neologisms or paraphasic errors on naming other  objects.  He also seemed a little bit hesitant to me in describing  things that he could see in a picture.  I would give him 1 point for  mild aphasia.  In terms of cranial nerves, pupils are equal and  reactive, visual fields are full, extraocular movements are intact.  Facial sensation has normal facial motor activity.  Normal hearing is  intact.  Palate is symmetric and tongue is midline.  On motor exam,  there is no drift of upper or lower extremities.  He has 5/5 strength,  however he has decreased rapid fine movement on both sides, poor  handwriting and general clumsiness although I do not see any asymmetry.  His handwriting is poor when he shows it to me.  His family showed me  something he wrote earlier which is even worse.  Deep tendon reflexes  are absent.  He has downgoing toes.  Coordination:  Finger-to-nose and  heel-to-shin are intact.  Sensory definitely shows some decrease to  pinprick on the right upper extremity without extinction.  He scores a 3  on the NIH scale for me, 1 point for sensory loss, 1 point for mild  aphasia and 1 point for mild dysarthria.   CT scan of the head shows no acute abnormalities.   IMPRESSION:  1. Right arm numbness and clumsiness and possible language disturbance      in the setting of new-onset atrial fibrillation and the recent      cardiac catheterization.   RECOMMENDATION:  Under the circumstances, would still consider this a  possible stroke and check an MRI scan of the brain to rule out stroke.  If that is positive, stroke team will follow.      Catherine A. Orlin Hilding, M.D.  Electronically Signed     CAW/MEDQ  D:  05/04/2006  T:  05/04/2006  Job:  [id number]   cc:   Michelene Gardener, MD  Bryan Lemma. Manus Gunning, M.D.

## 2011-01-11 HISTORY — PX: EYE SURGERY: SHX253

## 2012-10-15 ENCOUNTER — Ambulatory Visit (INDEPENDENT_AMBULATORY_CARE_PROVIDER_SITE_OTHER): Payer: Medicare Other | Admitting: Pharmacist

## 2012-10-15 DIAGNOSIS — I4891 Unspecified atrial fibrillation: Secondary | ICD-10-CM

## 2012-10-15 DIAGNOSIS — I6789 Other cerebrovascular disease: Secondary | ICD-10-CM | POA: Insufficient documentation

## 2012-10-15 HISTORY — DX: Unspecified atrial fibrillation: I48.91

## 2012-11-26 ENCOUNTER — Ambulatory Visit (INDEPENDENT_AMBULATORY_CARE_PROVIDER_SITE_OTHER): Payer: Medicare Other | Admitting: *Deleted

## 2012-11-26 DIAGNOSIS — I6789 Other cerebrovascular disease: Secondary | ICD-10-CM

## 2012-11-26 DIAGNOSIS — I4891 Unspecified atrial fibrillation: Secondary | ICD-10-CM

## 2012-12-25 ENCOUNTER — Encounter: Payer: Self-pay | Admitting: Interventional Cardiology

## 2012-12-25 ENCOUNTER — Ambulatory Visit (INDEPENDENT_AMBULATORY_CARE_PROVIDER_SITE_OTHER): Payer: Medicare Other | Admitting: General Practice

## 2012-12-25 ENCOUNTER — Ambulatory Visit (INDEPENDENT_AMBULATORY_CARE_PROVIDER_SITE_OTHER): Payer: Medicare Other | Admitting: Interventional Cardiology

## 2012-12-25 VITALS — BP 134/80 | HR 91 | Ht 68.0 in | Wt 208.8 lb

## 2012-12-25 DIAGNOSIS — Z7901 Long term (current) use of anticoagulants: Secondary | ICD-10-CM | POA: Insufficient documentation

## 2012-12-25 DIAGNOSIS — I4891 Unspecified atrial fibrillation: Secondary | ICD-10-CM

## 2012-12-25 DIAGNOSIS — I6340 Cerebral infarction due to embolism of unspecified cerebral artery: Secondary | ICD-10-CM

## 2012-12-25 DIAGNOSIS — I6789 Other cerebrovascular disease: Secondary | ICD-10-CM

## 2012-12-25 DIAGNOSIS — I639 Cerebral infarction, unspecified: Secondary | ICD-10-CM

## 2012-12-25 HISTORY — DX: Cerebral infarction, unspecified: I63.9

## 2012-12-25 HISTORY — DX: Long term (current) use of anticoagulants: Z79.01

## 2012-12-25 NOTE — Progress Notes (Signed)
Patient ID: [name], male   DOB: 12/03/1930, 79 y.o.   MRN: [id number]    [address] [place], [place]  [address] Phone: [phone] Fax:  [phone]  Date:  12/25/2012   ID:  [name], DOB 06/28/1930, MRN [id number]  PCP:  No primary provider on file.   ASSESSMENT:  1. Atrial fibrillation, with controlled rate 2. Chronic anticoagulation without complications 3. Embolic CVA  PLAN:  1. Current medical regimen 2. Clinical followup in one year 3. Coumadin clinic   SUBJECTIVE: [name] is a 79 y.o. male who voices no cardiac complaints. His mobility is decreasing as he ages. He denies syncope and chest pain. No new neurological complaints. No bleeding is associated with Coumadin therapy to   Wt Readings from Last 3 Encounters:  12/25/12 208 lb 12.8 oz (94.711 kg)     History reviewed. No pertinent past medical history.  Current Outpatient Prescriptions  Medication Sig Dispense Refill  . aspirin 81 MG tablet Take 81 mg by mouth daily.      Marland Kitchen atenolol (TENORMIN) 50 MG tablet Take 50 mg by mouth daily.      . calcium carbonate (OS-CAL) 600 MG TABS tablet Take 600 mg by mouth 2 (two) times daily with a meal.      . Glucosamine HCl (GLUCOSAMINE PO) Take 1,500 mg by mouth.      . hydrochlorothiazide (HYDRODIURIL) 12.5 MG tablet Take 12.5 mg by mouth daily.      Marland Kitchen lisinopril (PRINIVIL,ZESTRIL) 10 MG tablet Take 10 mg by mouth daily.      . Multiple Vitamin (MULTIVITAMIN) tablet Take 1 tablet by mouth daily.      . tamsulosin (FLOMAX) 0.4 MG CAPS capsule Take 0.4 mg by mouth.      . warfarin (COUMADIN) 5 MG tablet Take 5 mg by mouth daily.       No current facility-administered medications for this visit.    Allergies:   No Known Allergies  Social History:  The patient  reports that he has quit smoking. He does not have any smokeless tobacco history on file. He reports that he does not drink alcohol or use illicit drugs.   ROS:  Please see the  history of present illness.   All other systems reviewed and negative.   OBJECTIVE: VS:  BP 134/80  Pulse 91  Ht 5\' 8"  (1.727 m)  Wt 208 lb 12.8 oz (94.711 kg)  BMI 31.76 kg/m2 Well nourished, well developed, in no acute distress, elderly bearded HEENT: normal Neck: JVD flat. Carotid bruit absent  Cardiac:  normal S1, S2; RRR; no murmur Lungs:  clear to auscultation bilaterally, no wheezing, rhonchi or rales Abd: soft, nontender, no hepatomegaly Ext: Edema absent. Pulses 2+ Skin: warm and dry Neuro:  CNs 2-12 intact, no focal abnormalities noted  EKG:  Atrial fibrillation with controlled rate       Signed, Darci Needle III, MD 12/25/2012 10:01 AM  Medical History: Paroxysmal atrial fibrillation, Hypertension, Embolic CVA with coumadin therapy, Diverticulosis, Left distal thumb amputation, BPH.

## 2012-12-25 NOTE — Patient Instructions (Signed)
Your physician recommends that you continue on your current medications as directed. Please refer to the Current Medication list given to you today.  Your physician wants you to follow-up in: 1 year. You will receive a reminder letter in the mail two months in advance. If you don't receive a letter, please call our office to schedule the follow-up appointment.  

## 2013-01-16 ENCOUNTER — Other Ambulatory Visit: Payer: Self-pay

## 2013-01-16 MED ORDER — ATENOLOL 50 MG PO TABS: 50.0000 mg | ORAL_TABLET | Freq: Every day | ORAL | Status: DC

## 2013-02-05 ENCOUNTER — Ambulatory Visit (INDEPENDENT_AMBULATORY_CARE_PROVIDER_SITE_OTHER): Payer: Medicare Other | Admitting: *Deleted

## 2013-02-05 DIAGNOSIS — I6789 Other cerebrovascular disease: Secondary | ICD-10-CM

## 2013-02-05 DIAGNOSIS — Z5181 Encounter for therapeutic drug level monitoring: Secondary | ICD-10-CM

## 2013-02-05 DIAGNOSIS — I4891 Unspecified atrial fibrillation: Secondary | ICD-10-CM

## 2013-02-05 LAB — POCT INR: INR: 2.1

## 2013-03-19 ENCOUNTER — Ambulatory Visit (INDEPENDENT_AMBULATORY_CARE_PROVIDER_SITE_OTHER): Payer: Medicare Other | Admitting: Pharmacist

## 2013-03-19 DIAGNOSIS — Z5181 Encounter for therapeutic drug level monitoring: Secondary | ICD-10-CM

## 2013-03-19 DIAGNOSIS — I6789 Other cerebrovascular disease: Secondary | ICD-10-CM

## 2013-03-19 DIAGNOSIS — I4891 Unspecified atrial fibrillation: Secondary | ICD-10-CM

## 2013-03-19 LAB — POCT INR: INR: 1.7

## 2013-03-28 ENCOUNTER — Other Ambulatory Visit: Payer: Self-pay | Admitting: *Deleted

## 2013-03-28 MED ORDER — WARFARIN SODIUM 5 MG PO TABS: ORAL_TABLET | ORAL | Status: DC

## 2013-04-10 ENCOUNTER — Ambulatory Visit (INDEPENDENT_AMBULATORY_CARE_PROVIDER_SITE_OTHER): Payer: Medicare Other | Admitting: *Deleted

## 2013-04-10 DIAGNOSIS — Z5181 Encounter for therapeutic drug level monitoring: Secondary | ICD-10-CM

## 2013-04-10 DIAGNOSIS — I6789 Other cerebrovascular disease: Secondary | ICD-10-CM

## 2013-04-10 DIAGNOSIS — I4891 Unspecified atrial fibrillation: Secondary | ICD-10-CM

## 2013-04-10 LAB — POCT INR: INR: 1.9

## 2013-04-24 ENCOUNTER — Ambulatory Visit (INDEPENDENT_AMBULATORY_CARE_PROVIDER_SITE_OTHER): Payer: Medicare Other | Admitting: *Deleted

## 2013-04-24 DIAGNOSIS — I4891 Unspecified atrial fibrillation: Secondary | ICD-10-CM

## 2013-04-24 DIAGNOSIS — Z5181 Encounter for therapeutic drug level monitoring: Secondary | ICD-10-CM

## 2013-04-24 DIAGNOSIS — I6789 Other cerebrovascular disease: Secondary | ICD-10-CM

## 2013-04-24 LAB — POCT INR: INR: 2.7

## 2013-05-15 ENCOUNTER — Ambulatory Visit (INDEPENDENT_AMBULATORY_CARE_PROVIDER_SITE_OTHER): Payer: Medicare Other | Admitting: Pharmacist

## 2013-05-15 DIAGNOSIS — Z5181 Encounter for therapeutic drug level monitoring: Secondary | ICD-10-CM

## 2013-05-15 DIAGNOSIS — I6789 Other cerebrovascular disease: Secondary | ICD-10-CM

## 2013-05-15 DIAGNOSIS — I4891 Unspecified atrial fibrillation: Secondary | ICD-10-CM

## 2013-05-15 LAB — POCT INR: INR: 1.7

## 2013-05-30 ENCOUNTER — Ambulatory Visit (INDEPENDENT_AMBULATORY_CARE_PROVIDER_SITE_OTHER): Payer: Medicare Other | Admitting: *Deleted

## 2013-05-30 DIAGNOSIS — I4891 Unspecified atrial fibrillation: Secondary | ICD-10-CM

## 2013-05-30 DIAGNOSIS — Z5181 Encounter for therapeutic drug level monitoring: Secondary | ICD-10-CM

## 2013-05-30 DIAGNOSIS — I6789 Other cerebrovascular disease: Secondary | ICD-10-CM

## 2013-05-30 LAB — POCT INR: INR: 3.2

## 2013-06-13 ENCOUNTER — Ambulatory Visit (INDEPENDENT_AMBULATORY_CARE_PROVIDER_SITE_OTHER): Payer: Medicare Other | Admitting: *Deleted

## 2013-06-13 DIAGNOSIS — I4891 Unspecified atrial fibrillation: Secondary | ICD-10-CM

## 2013-06-13 DIAGNOSIS — Z5181 Encounter for therapeutic drug level monitoring: Secondary | ICD-10-CM

## 2013-06-13 DIAGNOSIS — I6789 Other cerebrovascular disease: Secondary | ICD-10-CM

## 2013-06-13 LAB — POCT INR: INR: 3.4

## 2013-06-27 ENCOUNTER — Ambulatory Visit (INDEPENDENT_AMBULATORY_CARE_PROVIDER_SITE_OTHER): Payer: Medicare Other

## 2013-06-27 DIAGNOSIS — I4891 Unspecified atrial fibrillation: Secondary | ICD-10-CM

## 2013-06-27 DIAGNOSIS — Z5181 Encounter for therapeutic drug level monitoring: Secondary | ICD-10-CM

## 2013-06-27 DIAGNOSIS — I6789 Other cerebrovascular disease: Secondary | ICD-10-CM

## 2013-06-27 LAB — POCT INR: INR: 2.2

## 2013-07-25 ENCOUNTER — Ambulatory Visit (INDEPENDENT_AMBULATORY_CARE_PROVIDER_SITE_OTHER): Payer: Medicare Other | Admitting: *Deleted

## 2013-07-25 DIAGNOSIS — Z5181 Encounter for therapeutic drug level monitoring: Secondary | ICD-10-CM

## 2013-07-25 DIAGNOSIS — I6789 Other cerebrovascular disease: Secondary | ICD-10-CM

## 2013-07-25 DIAGNOSIS — I4891 Unspecified atrial fibrillation: Secondary | ICD-10-CM

## 2013-07-25 LAB — POCT INR: INR: 2.3

## 2013-08-06 ENCOUNTER — Other Ambulatory Visit: Payer: Self-pay | Admitting: Interventional Cardiology

## 2013-08-22 ENCOUNTER — Ambulatory Visit (INDEPENDENT_AMBULATORY_CARE_PROVIDER_SITE_OTHER): Payer: Medicare Other | Admitting: *Deleted

## 2013-08-22 DIAGNOSIS — Z5181 Encounter for therapeutic drug level monitoring: Secondary | ICD-10-CM

## 2013-08-22 DIAGNOSIS — I4891 Unspecified atrial fibrillation: Secondary | ICD-10-CM

## 2013-08-22 DIAGNOSIS — I6789 Other cerebrovascular disease: Secondary | ICD-10-CM

## 2013-08-22 LAB — POCT INR: INR: 2.1

## 2013-09-05 ENCOUNTER — Other Ambulatory Visit: Payer: Self-pay

## 2013-09-05 MED ORDER — HYDROCHLOROTHIAZIDE 12.5 MG PO TABS: 12.5000 mg | ORAL_TABLET | Freq: Every day | ORAL | Status: DC

## 2013-09-26 ENCOUNTER — Ambulatory Visit (INDEPENDENT_AMBULATORY_CARE_PROVIDER_SITE_OTHER): Payer: Medicare Other | Admitting: Pharmacist

## 2013-09-26 DIAGNOSIS — I6789 Other cerebrovascular disease: Secondary | ICD-10-CM

## 2013-09-26 DIAGNOSIS — I4891 Unspecified atrial fibrillation: Secondary | ICD-10-CM

## 2013-09-26 DIAGNOSIS — Z5181 Encounter for therapeutic drug level monitoring: Secondary | ICD-10-CM

## 2013-09-26 LAB — POCT INR: INR: 2.4

## 2013-10-09 ENCOUNTER — Other Ambulatory Visit: Payer: Self-pay | Admitting: Interventional Cardiology

## 2013-11-07 ENCOUNTER — Ambulatory Visit (INDEPENDENT_AMBULATORY_CARE_PROVIDER_SITE_OTHER): Payer: Medicare Other

## 2013-11-07 DIAGNOSIS — I6789 Other cerebrovascular disease: Secondary | ICD-10-CM

## 2013-11-07 DIAGNOSIS — I4891 Unspecified atrial fibrillation: Secondary | ICD-10-CM

## 2013-11-07 DIAGNOSIS — Z5181 Encounter for therapeutic drug level monitoring: Secondary | ICD-10-CM

## 2013-11-07 LAB — POCT INR: INR: 2.3

## 2013-12-16 ENCOUNTER — Other Ambulatory Visit: Payer: Self-pay

## 2013-12-16 MED ORDER — WARFARIN SODIUM 5 MG PO TABS: ORAL_TABLET | ORAL | Status: DC

## 2013-12-18 ENCOUNTER — Ambulatory Visit (INDEPENDENT_AMBULATORY_CARE_PROVIDER_SITE_OTHER): Payer: Medicare Other | Admitting: *Deleted

## 2013-12-18 ENCOUNTER — Ambulatory Visit: Payer: Medicare Other | Admitting: Interventional Cardiology

## 2013-12-18 DIAGNOSIS — I4891 Unspecified atrial fibrillation: Secondary | ICD-10-CM

## 2013-12-18 DIAGNOSIS — I6789 Other cerebrovascular disease: Secondary | ICD-10-CM

## 2013-12-18 DIAGNOSIS — Z5181 Encounter for therapeutic drug level monitoring: Secondary | ICD-10-CM

## 2013-12-18 LAB — POCT INR: INR: 2.2

## 2014-01-06 ENCOUNTER — Other Ambulatory Visit: Payer: Self-pay | Admitting: Interventional Cardiology

## 2014-01-16 ENCOUNTER — Other Ambulatory Visit: Payer: Self-pay | Admitting: *Deleted

## 2014-01-16 MED ORDER — HYDROCHLOROTHIAZIDE 12.5 MG PO TABS: 12.5000 mg | ORAL_TABLET | Freq: Every day | ORAL | Status: DC

## 2014-01-21 ENCOUNTER — Encounter: Payer: Self-pay | Admitting: Interventional Cardiology

## 2014-01-21 ENCOUNTER — Ambulatory Visit (INDEPENDENT_AMBULATORY_CARE_PROVIDER_SITE_OTHER): Payer: Medicare Other | Admitting: Interventional Cardiology

## 2014-01-21 VITALS — BP 148/76 | HR 91 | Ht 70.0 in | Wt 207.0 lb

## 2014-01-21 DIAGNOSIS — Z7901 Long term (current) use of anticoagulants: Secondary | ICD-10-CM

## 2014-01-21 DIAGNOSIS — I639 Cerebral infarction, unspecified: Secondary | ICD-10-CM

## 2014-01-21 DIAGNOSIS — I4891 Unspecified atrial fibrillation: Secondary | ICD-10-CM

## 2014-01-21 DIAGNOSIS — I6340 Cerebral infarction due to embolism of unspecified cerebral artery: Secondary | ICD-10-CM

## 2014-01-21 DIAGNOSIS — I10 Essential (primary) hypertension: Secondary | ICD-10-CM

## 2014-01-21 HISTORY — DX: Essential (primary) hypertension: I10

## 2014-01-21 MED ORDER — HYDROCHLOROTHIAZIDE 12.5 MG PO TABS: 12.5000 mg | ORAL_TABLET | Freq: Every day | ORAL | Status: DC

## 2014-01-21 NOTE — Patient Instructions (Signed)
Your physician recommends that you continue on your current medications as directed. Please refer to the Current Medication list given to you today.  Your physician wants you to follow-up in: 1 year with Dr.Smith You will receive a reminder letter in the mail two months in advance. If you don't receive a letter, please call our office to schedule the follow-up appointment.  

## 2014-01-21 NOTE — Progress Notes (Signed)
Patient ID: BATES COLLINGTON, male   DOB: [birth year], 81 y.o.   MRN: [id number]    [address] [place], [place]  [address] Phone: [phone] Fax:  [phone]  Date:  01/21/2014   ID:  KAYLON HITZ, DOB June 25, 1930, MRN [id number]  PCP:  Simona Huh, MD   ASSESSMENT:  1. Paroxysmal atrial fibrillation 2. Long-term anticoagulation 3. Embolic CVA, cath related 4. Essential hypertension  PLAN:  1. Refill hydrochlorothiazide 2. Notify if any falls or head trauma 3. Continue anticoagulation therapy in the form of aspirin and Coumadin 4. One-year follow-up   SUBJECTIVE: [name] is a 81 y.o. male who is doing well. He denies dyspnea, angina, and edema. No episodes of syncope. He denies palpitations. Overall energy level is controlled.   Wt Readings from Last 3 Encounters:  01/21/14 207 lb (93.895 kg)  12/25/12 208 lb 12.8 oz (94.711 kg)     Past Medical History  Diagnosis Date  . Atrial fibrillation 10/15/2012  . Long term current use of anticoagulant therapy 12/25/2012  . Embolic stroke 35/36/1443  . Essential hypertension 01/21/2014    Current Outpatient Prescriptions  Medication Sig Dispense Refill  . aspirin 81 MG tablet Take 81 mg by mouth daily.    Marland Kitchen atenolol (TENORMIN) 50 MG tablet TAKE ONE TABLET BY MOUTH ONCE DAILY 30 tablet 0  . calcium carbonate (OS-CAL) 600 MG TABS tablet Take 600 mg by mouth 2 (two) times daily with a meal.    . Glucosamine HCl (GLUCOSAMINE PO) Take 1,500 mg by mouth.    . hydrochlorothiazide (HYDRODIURIL) 12.5 MG tablet Take 1 tablet (12.5 mg total) by mouth daily. 30 tablet 0  . lisinopril (PRINIVIL,ZESTRIL) 10 MG tablet Take 10 mg by mouth daily.    . Multiple Vitamin (MULTIVITAMIN) tablet Take 1 tablet by mouth daily.    . tamsulosin (FLOMAX) 0.4 MG CAPS capsule Take 0.4 mg by mouth.    . warfarin (COUMADIN) 5 MG tablet Take as directed by anticoagulation clinic 35 tablet 3   No current facility-administered  medications for this visit.    Allergies:   No Known Allergies  Social History:  The patient  reports that he has quit smoking. He does not have any smokeless tobacco history on file. He reports that he does not drink alcohol or use illicit drugs.   ROS:  Please see the history of present illness.   No blood in the urine or stool. Appetite is been stable. Denies transient neurological symptoms.   All other systems reviewed and negative.   OBJECTIVE: VS:  BP 148/76 mmHg  Pulse 91  Ht 5\' 10"  (1.778 m)  Wt 207 lb (93.895 kg)  BMI 29.70 kg/m2 Well nourished, well developed, in no acute distress, bearded. Gaining weight. HEENT: normal Neck: JVD flat. Carotid bruit absent  Cardiac:  normal S1, S2; irregularly irregular RR; no murmur Lungs:  clear to auscultation bilaterally, no wheezing, rhonchi or rales Abd: soft, nontender, no hepatomegaly Ext: Edema absent . Pulses 2+ Skin: warm and dry Neuro:  CNs 2-12 intact, no focal abnormalities noted  EKG:   Atrial fibrillation with controlled ventricular response       Signed, Illene Labrador III, MD 01/21/2014 8:50 AM

## 2014-01-29 ENCOUNTER — Ambulatory Visit (INDEPENDENT_AMBULATORY_CARE_PROVIDER_SITE_OTHER): Payer: Medicare Other | Admitting: *Deleted

## 2014-01-29 DIAGNOSIS — I6789 Other cerebrovascular disease: Secondary | ICD-10-CM

## 2014-01-29 DIAGNOSIS — Z5181 Encounter for therapeutic drug level monitoring: Secondary | ICD-10-CM

## 2014-01-29 DIAGNOSIS — I4891 Unspecified atrial fibrillation: Secondary | ICD-10-CM

## 2014-01-29 LAB — POCT INR: INR: 2.3

## 2014-02-03 ENCOUNTER — Other Ambulatory Visit: Payer: Self-pay | Admitting: Interventional Cardiology

## 2014-03-12 ENCOUNTER — Ambulatory Visit (INDEPENDENT_AMBULATORY_CARE_PROVIDER_SITE_OTHER): Payer: Medicare Other | Admitting: *Deleted

## 2014-03-12 DIAGNOSIS — I6789 Other cerebrovascular disease: Secondary | ICD-10-CM

## 2014-03-12 DIAGNOSIS — Z5181 Encounter for therapeutic drug level monitoring: Secondary | ICD-10-CM

## 2014-03-12 DIAGNOSIS — I4891 Unspecified atrial fibrillation: Secondary | ICD-10-CM

## 2014-03-12 LAB — POCT INR: INR: 1.8

## 2014-04-02 ENCOUNTER — Ambulatory Visit (INDEPENDENT_AMBULATORY_CARE_PROVIDER_SITE_OTHER): Payer: Medicare Other | Admitting: *Deleted

## 2014-04-02 DIAGNOSIS — Z5181 Encounter for therapeutic drug level monitoring: Secondary | ICD-10-CM

## 2014-04-02 DIAGNOSIS — I4891 Unspecified atrial fibrillation: Secondary | ICD-10-CM

## 2014-04-02 DIAGNOSIS — I6789 Other cerebrovascular disease: Secondary | ICD-10-CM | POA: Diagnosis not present

## 2014-04-02 LAB — POCT INR: INR: 1.8

## 2014-04-16 ENCOUNTER — Ambulatory Visit (INDEPENDENT_AMBULATORY_CARE_PROVIDER_SITE_OTHER): Payer: Medicare Other | Admitting: Surgery

## 2014-04-16 DIAGNOSIS — I4891 Unspecified atrial fibrillation: Secondary | ICD-10-CM | POA: Diagnosis not present

## 2014-04-16 DIAGNOSIS — I6789 Other cerebrovascular disease: Secondary | ICD-10-CM

## 2014-04-16 DIAGNOSIS — Z5181 Encounter for therapeutic drug level monitoring: Secondary | ICD-10-CM

## 2014-04-16 LAB — POCT INR: INR: 2

## 2014-04-21 ENCOUNTER — Other Ambulatory Visit: Payer: Self-pay | Admitting: Interventional Cardiology

## 2014-05-07 ENCOUNTER — Ambulatory Visit (INDEPENDENT_AMBULATORY_CARE_PROVIDER_SITE_OTHER): Payer: Medicare Other | Admitting: *Deleted

## 2014-05-07 DIAGNOSIS — Z5181 Encounter for therapeutic drug level monitoring: Secondary | ICD-10-CM | POA: Diagnosis not present

## 2014-05-07 DIAGNOSIS — I4891 Unspecified atrial fibrillation: Secondary | ICD-10-CM

## 2014-05-07 DIAGNOSIS — I6789 Other cerebrovascular disease: Secondary | ICD-10-CM | POA: Diagnosis not present

## 2014-05-07 LAB — POCT INR: INR: 2.3

## 2014-06-04 ENCOUNTER — Ambulatory Visit (INDEPENDENT_AMBULATORY_CARE_PROVIDER_SITE_OTHER): Payer: Medicare Other

## 2014-06-04 DIAGNOSIS — I4891 Unspecified atrial fibrillation: Secondary | ICD-10-CM | POA: Diagnosis not present

## 2014-06-04 DIAGNOSIS — I6789 Other cerebrovascular disease: Secondary | ICD-10-CM | POA: Diagnosis not present

## 2014-06-04 DIAGNOSIS — Z5181 Encounter for therapeutic drug level monitoring: Secondary | ICD-10-CM | POA: Diagnosis not present

## 2014-06-04 LAB — POCT INR: INR: 2

## 2014-07-02 ENCOUNTER — Ambulatory Visit (INDEPENDENT_AMBULATORY_CARE_PROVIDER_SITE_OTHER): Payer: Medicare Other | Admitting: *Deleted

## 2014-07-02 DIAGNOSIS — Z5181 Encounter for therapeutic drug level monitoring: Secondary | ICD-10-CM

## 2014-07-02 DIAGNOSIS — I6789 Other cerebrovascular disease: Secondary | ICD-10-CM | POA: Diagnosis not present

## 2014-07-02 DIAGNOSIS — I4891 Unspecified atrial fibrillation: Secondary | ICD-10-CM

## 2014-07-02 LAB — POCT INR: INR: 2.5

## 2014-07-07 ENCOUNTER — Other Ambulatory Visit: Payer: Self-pay

## 2014-08-13 ENCOUNTER — Ambulatory Visit (INDEPENDENT_AMBULATORY_CARE_PROVIDER_SITE_OTHER): Payer: Medicare Other | Admitting: *Deleted

## 2014-08-13 DIAGNOSIS — I4891 Unspecified atrial fibrillation: Secondary | ICD-10-CM

## 2014-08-13 DIAGNOSIS — Z5181 Encounter for therapeutic drug level monitoring: Secondary | ICD-10-CM | POA: Diagnosis not present

## 2014-08-13 DIAGNOSIS — I6789 Other cerebrovascular disease: Secondary | ICD-10-CM | POA: Diagnosis not present

## 2014-08-13 LAB — POCT INR: INR: 2.2

## 2014-08-19 ENCOUNTER — Other Ambulatory Visit: Payer: Self-pay | Admitting: Interventional Cardiology

## 2014-09-09 ENCOUNTER — Encounter: Payer: Self-pay | Admitting: Interventional Cardiology

## 2014-09-24 ENCOUNTER — Ambulatory Visit (INDEPENDENT_AMBULATORY_CARE_PROVIDER_SITE_OTHER): Payer: Medicare Other | Admitting: *Deleted

## 2014-09-24 DIAGNOSIS — I6789 Other cerebrovascular disease: Secondary | ICD-10-CM | POA: Diagnosis not present

## 2014-09-24 DIAGNOSIS — Z5181 Encounter for therapeutic drug level monitoring: Secondary | ICD-10-CM | POA: Diagnosis not present

## 2014-09-24 DIAGNOSIS — I4891 Unspecified atrial fibrillation: Secondary | ICD-10-CM

## 2014-09-24 LAB — POCT INR: INR: 2.7

## 2014-11-03 ENCOUNTER — Ambulatory Visit (INDEPENDENT_AMBULATORY_CARE_PROVIDER_SITE_OTHER): Payer: Medicare Other | Admitting: *Deleted

## 2014-11-03 DIAGNOSIS — I4891 Unspecified atrial fibrillation: Secondary | ICD-10-CM

## 2014-11-03 DIAGNOSIS — Z5181 Encounter for therapeutic drug level monitoring: Secondary | ICD-10-CM

## 2014-11-03 DIAGNOSIS — I6789 Other cerebrovascular disease: Secondary | ICD-10-CM | POA: Diagnosis not present

## 2014-11-03 LAB — POCT INR: INR: 2.9

## 2014-12-15 ENCOUNTER — Ambulatory Visit (INDEPENDENT_AMBULATORY_CARE_PROVIDER_SITE_OTHER): Payer: Medicare Other | Admitting: *Deleted

## 2014-12-15 DIAGNOSIS — I4891 Unspecified atrial fibrillation: Secondary | ICD-10-CM | POA: Diagnosis not present

## 2014-12-15 DIAGNOSIS — I6789 Other cerebrovascular disease: Secondary | ICD-10-CM | POA: Diagnosis not present

## 2014-12-15 DIAGNOSIS — Z5181 Encounter for therapeutic drug level monitoring: Secondary | ICD-10-CM

## 2014-12-15 LAB — POCT INR: INR: 2.7

## 2014-12-16 ENCOUNTER — Other Ambulatory Visit: Payer: Self-pay | Admitting: Interventional Cardiology

## 2015-01-26 ENCOUNTER — Ambulatory Visit (INDEPENDENT_AMBULATORY_CARE_PROVIDER_SITE_OTHER): Payer: Medicare Other | Admitting: Pharmacist

## 2015-01-26 ENCOUNTER — Other Ambulatory Visit: Payer: Self-pay | Admitting: Interventional Cardiology

## 2015-01-26 DIAGNOSIS — Z5181 Encounter for therapeutic drug level monitoring: Secondary | ICD-10-CM | POA: Diagnosis not present

## 2015-01-26 DIAGNOSIS — I4891 Unspecified atrial fibrillation: Secondary | ICD-10-CM

## 2015-01-26 DIAGNOSIS — I6789 Other cerebrovascular disease: Secondary | ICD-10-CM

## 2015-01-26 LAB — POCT INR: INR: 2.6

## 2015-02-10 ENCOUNTER — Other Ambulatory Visit: Payer: Self-pay | Admitting: Interventional Cardiology

## 2015-03-03 ENCOUNTER — Encounter: Payer: Self-pay | Admitting: Interventional Cardiology

## 2015-03-03 ENCOUNTER — Ambulatory Visit (INDEPENDENT_AMBULATORY_CARE_PROVIDER_SITE_OTHER): Payer: Medicare Other | Admitting: *Deleted

## 2015-03-03 ENCOUNTER — Ambulatory Visit (INDEPENDENT_AMBULATORY_CARE_PROVIDER_SITE_OTHER): Payer: Medicare Other | Admitting: Interventional Cardiology

## 2015-03-03 VITALS — BP 132/76 | HR 79 | Ht 70.0 in | Wt 212.4 lb

## 2015-03-03 DIAGNOSIS — I6340 Cerebral infarction due to embolism of unspecified cerebral artery: Secondary | ICD-10-CM

## 2015-03-03 DIAGNOSIS — I6789 Other cerebrovascular disease: Secondary | ICD-10-CM

## 2015-03-03 DIAGNOSIS — I482 Chronic atrial fibrillation, unspecified: Secondary | ICD-10-CM

## 2015-03-03 DIAGNOSIS — Z7901 Long term (current) use of anticoagulants: Secondary | ICD-10-CM | POA: Diagnosis not present

## 2015-03-03 DIAGNOSIS — I4891 Unspecified atrial fibrillation: Secondary | ICD-10-CM | POA: Diagnosis not present

## 2015-03-03 DIAGNOSIS — I63422 Cerebral infarction due to embolism of left anterior cerebral artery: Secondary | ICD-10-CM | POA: Diagnosis not present

## 2015-03-03 DIAGNOSIS — Z5181 Encounter for therapeutic drug level monitoring: Secondary | ICD-10-CM

## 2015-03-03 DIAGNOSIS — I10 Essential (primary) hypertension: Secondary | ICD-10-CM | POA: Diagnosis not present

## 2015-03-03 LAB — POCT INR: INR: 2.5

## 2015-03-03 NOTE — Progress Notes (Signed)
Cardiology Office Note   Date:  03/03/2015   ID:  Ibraham, Nedd 04-Oct-1930, MRN [id number]  PCP:  Simona Huh, MD  Cardiologist:  Sinclair Grooms, MD   No chief complaint on file.     History of Present Illness: [name] is a 82 y.o. male who presents for  Follow-up with chronic atrial fibrillation, long-term anticoagulation therapy, and history of embolic CVA. He also has essential hypertension.   He is doing well. He denies angina. He has had some neck pain. He has not had syncope, palpitations, orthopnea, PND. He's had some chest discomfort but he has not used nitroglycerin for it. Last episode was greater than 2 months ago.    Past Medical History  Diagnosis Date  . Atrial fibrillation ([place]) 10/15/2012  . Long term current use of anticoagulant therapy 12/25/2012  . Embolic stroke ([place]) 123XX123  . Essential hypertension 01/21/2014    No past surgical history on file.   Current Outpatient Prescriptions  Medication Sig Dispense Refill  . aspirin 81 MG tablet Take 81 mg by mouth daily.    Marland Kitchen atenolol (TENORMIN) 50 MG tablet TAKE ONE TABLET BY MOUTH ONCE DAILY 30 tablet 1  . calcium carbonate (OS-CAL) 600 MG TABS tablet Take 600 mg by mouth daily.     . Glucosamine HCl (GLUCOSAMINE PO) Take 1,500 mg by mouth daily.     . hydrochlorothiazide (HYDRODIURIL) 12.5 MG tablet TAKE ONE CAPSULE BY MOUTH ONCE DAILY 90 tablet 0  . lisinopril (PRINIVIL,ZESTRIL) 10 MG tablet Take 10 mg by mouth daily.    . Multiple Vitamin (MULTIVITAMIN) tablet Take 1 tablet by mouth daily.    . tamsulosin (FLOMAX) 0.4 MG CAPS capsule Take 0.4 mg by mouth daily.     Marland Kitchen warfarin (COUMADIN) 5 MG tablet TAKE AS DIRECTED BY ANTICOAGULATION CLINIC 35 tablet 3   No current facility-administered medications for this visit.    Allergies:   Review of patient's allergies indicates no known allergies.    Social History:  The patient  reports that he has quit smoking. He has never used  smokeless tobacco. He reports that he does not drink alcohol or use illicit drugs.   Family History:  The patient's family history includes Other in his father and mother.    ROS:  Please see the history of present illness.   Otherwise, review of systems are positive for  Lower extremity swelling, easy bruising, left neck pain..   All other systems are reviewed and negative.    PHYSICAL EXAM: VS:  BP 132/76 mmHg  Pulse 79  Ht 5\' 10"  (1.778 m)  Wt 212 lb 6.4 oz (96.344 kg)  BMI 30.48 kg/m2 , BMI Body mass index is 30.48 kg/(m^2). GEN: Well nourished, well developed, in no acute distress HEENT: normal Neck: no JVD, carotid bruits, or masses Cardiac: RRR.  There is no murmur, rub, or gallop. There is no edema. Respiratory:  clear to auscultation bilaterally, normal work of breathing. GI: soft, nontender, nondistended, + BS MS: no deformity or atrophy Skin: warm and dry, no rash Neuro:  Strength and sensation are intact Psych: euthymic mood, full affect   EKG:  EKG is ordered today. The ekg reveals  Atrial fibrillation with controlled rate at 79 bpm. Otherwise unremarkable.   Recent Labs: No results found for requested labs within last 365 days.    Lipid Panel No results found for: CHOL, TRIG, HDL, CHOLHDL, VLDL, LDLCALC, LDLDIRECT    Wt Readings  from Last 3 Encounters:  03/03/15 212 lb 6.4 oz (96.344 kg)  01/21/14 207 lb (93.895 kg)  12/25/12 208 lb 12.8 oz (94.711 kg)      Other studies Reviewed: Additional studies/ records that were reviewed today include: None. The findings include none.    ASSESSMENT AND PLAN:  1. Chronic atrial fibrillation (HCC)  trolled rate  2. Long term current use of anticoagulant therapy  no bleeding  3. 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 Essential hypertension  controlled    Current medicines are reviewed at length with the patient today.  The patient has  the following concerns regarding medicines: .  The following changes/actions have been instituted:     notification if any bleeding or head trauma   Active lifestyle  Labs/ tests ordered today include:  No orders of the defined types were placed in this encounter.     Disposition:   FU with HS in 1 year  Signed, Sinclair Grooms, MD  03/03/2015 9:33 AM    [address], [place], [place]  [address] Phone: [phone]; Fax: [phone]

## 2015-03-03 NOTE — Patient Instructions (Signed)
Medication Instructions:  Your physician recommends that you continue on your current medications as directed. Please refer to the Current Medication list given to you today.   Labwork: None ordered  Testing/Procedures: None ordered  Follow-Up: Your physician wants you to follow-up in: 1 year You will receive a reminder letter in the mail two months in advance. If you don't receive a letter, please call our office to schedule the follow-up appointment.   Any Other Special Instructions Will Be Listed Below (If Applicable).     If you need a refill on your cardiac medications before your next appointment, please call your pharmacy.   

## 2015-03-31 ENCOUNTER — Other Ambulatory Visit: Payer: Self-pay | Admitting: Interventional Cardiology

## 2015-04-14 ENCOUNTER — Ambulatory Visit (INDEPENDENT_AMBULATORY_CARE_PROVIDER_SITE_OTHER): Payer: Medicare Other | Admitting: *Deleted

## 2015-04-14 DIAGNOSIS — Z5181 Encounter for therapeutic drug level monitoring: Secondary | ICD-10-CM

## 2015-04-14 DIAGNOSIS — I482 Chronic atrial fibrillation, unspecified: Secondary | ICD-10-CM

## 2015-04-14 DIAGNOSIS — I6789 Other cerebrovascular disease: Secondary | ICD-10-CM

## 2015-04-14 DIAGNOSIS — I4891 Unspecified atrial fibrillation: Secondary | ICD-10-CM | POA: Diagnosis not present

## 2015-04-14 LAB — POCT INR: INR: 2.4

## 2015-04-21 ENCOUNTER — Other Ambulatory Visit: Payer: Self-pay | Admitting: Interventional Cardiology

## 2015-05-11 ENCOUNTER — Other Ambulatory Visit: Payer: Self-pay | Admitting: Interventional Cardiology

## 2015-05-26 ENCOUNTER — Ambulatory Visit (INDEPENDENT_AMBULATORY_CARE_PROVIDER_SITE_OTHER): Payer: Medicare Other | Admitting: *Deleted

## 2015-05-26 DIAGNOSIS — I482 Chronic atrial fibrillation, unspecified: Secondary | ICD-10-CM

## 2015-05-26 DIAGNOSIS — I4891 Unspecified atrial fibrillation: Secondary | ICD-10-CM | POA: Diagnosis not present

## 2015-05-26 DIAGNOSIS — Z5181 Encounter for therapeutic drug level monitoring: Secondary | ICD-10-CM

## 2015-05-26 DIAGNOSIS — I6789 Other cerebrovascular disease: Secondary | ICD-10-CM

## 2015-05-26 LAB — POCT INR: INR: 2.5

## 2015-07-07 ENCOUNTER — Ambulatory Visit (INDEPENDENT_AMBULATORY_CARE_PROVIDER_SITE_OTHER): Payer: Medicare Other

## 2015-07-07 DIAGNOSIS — I6789 Other cerebrovascular disease: Secondary | ICD-10-CM | POA: Diagnosis not present

## 2015-07-07 DIAGNOSIS — Z5181 Encounter for therapeutic drug level monitoring: Secondary | ICD-10-CM

## 2015-07-07 DIAGNOSIS — I482 Chronic atrial fibrillation, unspecified: Secondary | ICD-10-CM

## 2015-07-07 DIAGNOSIS — I4891 Unspecified atrial fibrillation: Secondary | ICD-10-CM | POA: Diagnosis not present

## 2015-07-07 LAB — POCT INR: INR: 2.6

## 2015-08-18 ENCOUNTER — Ambulatory Visit (INDEPENDENT_AMBULATORY_CARE_PROVIDER_SITE_OTHER): Payer: Medicare Other | Admitting: *Deleted

## 2015-08-18 DIAGNOSIS — I6789 Other cerebrovascular disease: Secondary | ICD-10-CM

## 2015-08-18 DIAGNOSIS — Z5181 Encounter for therapeutic drug level monitoring: Secondary | ICD-10-CM

## 2015-08-18 DIAGNOSIS — I4891 Unspecified atrial fibrillation: Secondary | ICD-10-CM

## 2015-08-18 LAB — POCT INR: INR: 2.7

## 2015-09-03 ENCOUNTER — Telehealth: Payer: Self-pay | Admitting: *Deleted

## 2015-09-04 MED ORDER — METOPROLOL SUCCINATE ER 50 MG PO TB24: 50.0000 mg | ORAL_TABLET | Freq: Every day | ORAL | 3 refills | Status: DC

## 2015-09-04 NOTE — Telephone Encounter (Signed)
Returned pt call. Pt rqst I speak with his wife Pt wife sts that his Atenolol is in back order with the pharmacy and not available. Adv pt that per Sandria Senter we can switch him to Metoprolol succinate 50mg  daily the Rx will be sent tio the pt pharmacy. Pt wife verbalized understanding.

## 2015-09-07 ENCOUNTER — Other Ambulatory Visit: Payer: Self-pay | Admitting: Interventional Cardiology

## 2015-09-11 ENCOUNTER — Encounter: Payer: Self-pay | Admitting: Interventional Cardiology

## 2015-09-29 ENCOUNTER — Ambulatory Visit (INDEPENDENT_AMBULATORY_CARE_PROVIDER_SITE_OTHER): Payer: Medicare Other | Admitting: *Deleted

## 2015-09-29 DIAGNOSIS — I6789 Other cerebrovascular disease: Secondary | ICD-10-CM | POA: Diagnosis not present

## 2015-09-29 DIAGNOSIS — Z5181 Encounter for therapeutic drug level monitoring: Secondary | ICD-10-CM

## 2015-09-29 DIAGNOSIS — I4891 Unspecified atrial fibrillation: Secondary | ICD-10-CM

## 2015-09-29 LAB — POCT INR: INR: 2.5

## 2015-10-19 ENCOUNTER — Other Ambulatory Visit: Payer: Self-pay

## 2015-10-19 ENCOUNTER — Ambulatory Visit (HOSPITAL_BASED_OUTPATIENT_CLINIC_OR_DEPARTMENT_OTHER)
Admission: RE | Admit: 2015-10-19 | Discharge: 2015-10-19 | Disposition: A | Discharge status: Home / Self Care | Payer: Medicare Other | Source: Ambulatory Visit | Attending: Family Medicine | Admitting: Family Medicine

## 2015-10-19 ENCOUNTER — Ambulatory Visit (INDEPENDENT_AMBULATORY_CARE_PROVIDER_SITE_OTHER): Payer: Medicare Other | Admitting: Family Medicine

## 2015-10-19 ENCOUNTER — Encounter: Payer: Self-pay | Admitting: Family Medicine

## 2015-10-19 VITALS — BP 130/66 | HR 94 | Temp 97.7°F | Resp 20 | Ht 68.0 in | Wt 204.2 lb

## 2015-10-19 DIAGNOSIS — I872 Venous insufficiency (chronic) (peripheral): Secondary | ICD-10-CM

## 2015-10-19 DIAGNOSIS — M7122 Synovial cyst of popliteal space [Baker], left knee: Secondary | ICD-10-CM | POA: Diagnosis not present

## 2015-10-19 DIAGNOSIS — M7121 Synovial cyst of popliteal space [Baker], right knee: Secondary | ICD-10-CM | POA: Diagnosis not present

## 2015-10-19 DIAGNOSIS — I4891 Unspecified atrial fibrillation: Secondary | ICD-10-CM | POA: Diagnosis not present

## 2015-10-19 DIAGNOSIS — M79605 Pain in left leg: Secondary | ICD-10-CM

## 2015-10-19 MED ORDER — DOXYCYCLINE HYCLATE 100 MG PO CAPS: 100.0000 mg | ORAL_CAPSULE | Freq: Two times a day (BID) | ORAL | 0 refills | Status: DC

## 2015-10-19 NOTE — Progress Notes (Signed)
Patient ID: [name], male    DOB: Jul 29, 1930, 82 y.o.   MRN: [id number]  PCP: Simona Huh, MD  Chief Complaint  Patient presents with  . redness on lower legs    x 1 week    Subjective:   HPI 82 year old male, presents for evaluation of leg swelling and bilateral ley erythema times 1 week. Patient medical problems include atrial fibrillation-rate controlled with metoprolol, and anticoagulated with Coumadin. Coumadin level are monitored every 6 weeks and have remained at goal (2.0-3.0) per patient. His next coumadin level is scheduled to drawn 11/10/15.   Patient reports 1 week ago legs started turning really red. His wife brought to his attention that his legs were progressively darkening.  He reports 3-4 days ago, his left leg began to increase in swelling and he developed left lower calf pain.  With leg elevation, the swelling hasn't subsided nor has the leg pain resolved. He denies chest pain., shortness of breath, or headache.  Social History   Social History  . Marital status: Married    Spouse name: N/A  . Number of children: N/A  . Years of education: N/A   Occupational History  . Not on file.   Social History Main Topics  . Smoking status: Former Research scientist (life sciences)  . Smokeless tobacco: Never Used  . Alcohol use No  . Drug use: No  . Sexual activity: Not on file   Other Topics Concern  . Not on file   Social History Narrative  . No narrative on file   Family History  Problem Relation Age of Onset  . Other Mother     Brain hemmorrhage  . Other Father     unknown    Review of Systems See HPI   Patient Active Problem List   Diagnosis Date Noted  . Essential hypertension 01/21/2014  . Encounter for therapeutic drug monitoring 02/05/2013  . Embolic stroke ([place]) 123XX123  . Long term current use of anticoagulant therapy 12/25/2012  . Atrial fibrillation ([place]) 10/15/2012  . Acute, but ill-defined, cerebrovascular disease 10/15/2012     Prior to  Admission medications   Medication Sig Start Date End Date Taking? Authorizing Provider  aspirin 81 MG tablet Take 81 mg by mouth daily.   Yes Historical Provider, MD  calcium carbonate (OS-CAL) 600 MG TABS tablet Take 600 mg by mouth daily.    Yes Historical Provider, MD  finasteride (PROSCAR) 5 MG tablet Take 5 mg by mouth daily.   Yes Historical Provider, MD  Glucosamine HCl (GLUCOSAMINE PO) Take 1,500 mg by mouth daily.    Yes Historical Provider, MD  hydrochlorothiazide (HYDRODIURIL) 12.5 MG tablet TAKE ONE TABLET BY MOUTH ONCE DAILY 05/12/15  Yes Belva Crome, MD  lisinopril (PRINIVIL,ZESTRIL) 10 MG tablet Take 10 mg by mouth daily.   Yes Historical Provider, MD  Multiple Vitamin (MULTIVITAMIN) tablet Take 1 tablet by mouth daily.   Yes Historical Provider, MD  Multiple Vitamins-Minerals (PRESERVISION AREDS 2) CAPS Take 1 capsule by mouth 2 (two) times daily.   Yes Historical Provider, MD  tamsulosin (FLOMAX) 0.4 MG CAPS capsule Take 0.4 mg by mouth daily.    Yes Historical Provider, MD  warfarin (COUMADIN) 5 MG tablet TAKE AS DIRECTED BY  ANTICOAGULATION  CLINIC 09/07/15  Yes Belva Crome, MD  metoprolol succinate (TOPROL-XL) 50 MG 24 hr tablet Take 1 tablet (50 mg total) by mouth daily. Take with or immediately following a meal. Patient not taking: Reported on 10/19/2015  09/04/15 12/03/15  Belva Crome, MD  No Known Allergies    Objective:  Physical Exam  Constitutional: He is oriented to person, place, and time. He appears well-developed and well-nourished.  HENT:  Head: Normocephalic and atraumatic.  Right Ear: External ear normal.  Left Ear: External ear normal.  Nose: Nose normal.  Eyes: Conjunctivae and EOM are normal. Pupils are equal, round, and reactive to light.  Neck: Normal range of motion. Neck supple.  Cardiovascular: Intact distal pulses.   12 Lead EKG-Atrial Fibrillation with rate variable 96-103 bpm. Negative for acute ischemia. Radial pulse +2 Dorsalis pedal pulses  +1  Pulmonary/Chest: Effort normal and breath sounds normal.  Musculoskeletal: He exhibits edema.  Bilateral leg edema  Right leg +2 non-pitting edema with erythema beginning immediately distal of tibial tuberosity cover the complete diameter of lower leg, and descending superiorly immediately above the ankle. Left leg +3 non-pitting edema with erythema beginning immediately distal of tibial tuberosity cover the complete diameter of lower leg, and descending superiorly immediately above the ankle. Unilateral left ankle non-pitting edema.  Neurological: He is alert and oriented to person, place, and time.  Skin: Skin is warm. Rash noted. There is erythema.  Psychiatric: He has a normal mood and affect. His behavior is normal. Judgment and thought content normal.     Vitals:   10/19/15 1743  BP: 130/66  Pulse: 94  Resp: 20  Temp: 97.7 F (36.5 C)  US Venous Img Lower Bilateral  Result Date: 10/19/2015 CLINICAL DATA:  Subacute onset of bilateral lower leg discoloration. Patient on Coumadin. Initial encounter. EXAM: BILATERAL LOWER EXTREMITY VENOUS DOPPLER ULTRASOUND TECHNIQUE: Gray-scale sonography with graded compression, as well as color Doppler and duplex ultrasound were performed to evaluate the lower extremity deep venous systems from the level of the common femoral vein and including the common femoral, femoral, profunda femoral, popliteal and calf veins including the posterior tibial, peroneal and gastrocnemius veins when visible. The superficial great saphenous vein was also interrogated. Spectral Doppler was utilized to evaluate flow at rest and with distal augmentation maneuvers in the common femoral, femoral and popliteal veins. COMPARISON:  None. FINDINGS: RIGHT LOWER EXTREMITY Common Femoral Vein: No evidence of thrombus. Normal compressibility, respiratory phasicity and response to augmentation. Saphenofemoral Junction: No evidence of thrombus. Normal compressibility and flow on  color Doppler imaging. Profunda Femoral Vein: No evidence of thrombus. Normal compressibility and flow on color Doppler imaging. Femoral Vein: No evidence of thrombus. Normal compressibility, respiratory phasicity and response to augmentation. Popliteal Vein: No evidence of thrombus. Normal compressibility, respiratory phasicity and response to augmentation. Calf Veins: No evidence of thrombus. Normal compressibility and flow on color Doppler imaging. Superficial Great Saphenous Vein: No evidence of thrombus. Normal compressibility and flow on color Doppler imaging. Venous Reflux:  None. Other Findings: A Baker's cyst is noted at the right popliteal fossa, measuring 3.0 x 1.3 x 1.7 cm. LEFT LOWER EXTREMITY Common Femoral Vein: No evidence of thrombus. Normal compressibility, respiratory phasicity and response to augmentation. Saphenofemoral Junction: No evidence of thrombus. Normal compressibility and flow on color Doppler imaging. Profunda Femoral Vein: No evidence of thrombus. Normal compressibility and flow on color Doppler imaging. Femoral Vein: No evidence of thrombus. Normal compressibility, respiratory phasicity and response to augmentation. Popliteal Vein: No evidence of thrombus. Normal compressibility, respiratory phasicity and response to augmentation. Calf Veins: No evidence of thrombus. Normal compressibility and flow on color Doppler imaging. Superficial Great Saphenous Vein: No evidence of thrombus. Normal compressibility and flow on color  Doppler imaging. Venous Reflux:  None. Other Findings: A Baker's cyst is noted at the left popliteal fossa, measuring 2.2 x 0.4 x 1.8 cm. IMPRESSION: 1. No evidence for deep venous thrombosis. 2. Small bilateral Baker's cysts noted. Electronically Signed   By: Garald Balding M.D.   On: 10/19/2015 20:29   Assessment & Plan:  Precepted patient with Dr. Merri Ray  1. Atrial fibrillation, unspecified type ([place]) - EKG 12-Lead 2. Pain of left lower  extremity - US Venous Img Lower Bilateral 3. Venous stasis dermatitis of both lower extremities  82 year old, male presents with a one week history of increasing erythema and edema of his bilateral lower legs. He developed left leg localized calf pain 3 days ago. He take Coumadin chronically for anti-coagulation due to Atrial Fibrillation. He is complaint with therapy and last INR 2.5 was at goal 09/29/15. EKG reveal active Atrial Fibrillation with rate between 96-103. Although, the patient has remained  therapeutic on Coumadin and would be less likely to develop a DVT, given the unilateral left leg calf pain and the left leg is visibly more edematous compared to right, I have a lower threshold and opted to obtain venous ultrasound bilateral lower extremities to rule out presence of DVT. Patient has visible venous stasis dermatitis. Will  cover patient with doxycycline to prevent development of cellulitis.  The venous ultrasound bilateral lower extremities was negative for DVT and revealed a Baker's cyst which is likely the source of left leg calf pain.   Plan: Patient advise to return for care to have both lower legs placed in unna boots for treatment of venous stasis. Start Doxycycline 100 mg twice daily for 10 days.  Carroll Sage. Kenton Kingfisher, MSN, FNP-C [facility]

## 2015-10-19 NOTE — Patient Instructions (Addendum)
Obtain venous doppler to rule out DVT at [facility].  If ultrasound is negative for DVT, we will place legs in unna boots to relieve venous status.  Return for wound care.   Start doxycyline 100 mg for 10 days to prevent cellulitis of lower legs.  IF you received an x-ray today, you will receive an invoice from [facility]. Please contact [facility] at [phone] with questions or concerns regarding your invoice.   IF you received labwork today, you will receive an invoice from [employer]. Please contact [facility] at [phone] with questions or concerns regarding your invoice.   Our billing staff will not be able to assist you with questions regarding bills from these companies.  You will be contacted with the lab results as soon as they are available. The fastest way to get your results is to activate your My Chart account. Instructions are located on the last page of this paperwork. If you have not heard from [place] regarding the results in 2 weeks, please contact this office.     YOU ARE SCHEDULED FOR A BILATERAL VENOUS DOPPLER STUDY. IT IS SCHEDULED TONIGHT AT [facility]. ADDRESS IS [address] IF YOU ARRIVE BEFORE 7 PM YOU CAN GO TO THE MAIN ENTRANCE AND FOLLOW SIGNS FOR IMAGING. IF AFTER 7 PM YOU WILL GO THROUGH THE EMERGENCY ROOM.

## 2015-10-20 ENCOUNTER — Ambulatory Visit (INDEPENDENT_AMBULATORY_CARE_PROVIDER_SITE_OTHER): Payer: Medicare Other | Admitting: Physician Assistant

## 2015-10-20 DIAGNOSIS — M7989 Other specified soft tissue disorders: Secondary | ICD-10-CM

## 2015-10-20 NOTE — Progress Notes (Signed)
[name]  MRN: [id number] DOB: Dec 20, 1930  PCP: Simona Huh, MD  Subjective:  Pt is an 82 year old male, history of atrial fibrillation, HTN and CVD, who presents to clinic for wound care. He is accompanied by his wife.   He was here yesterday for several days of  bilateral leg swelling and unilateral calf pain. Venous doppler ultrasound yesterday ruled out DVT, positive for Baker's cyst. EKG revealed atrial fibrillation, negative for acute ischemia.  Doxycycline 100 mg was initiated to prevent cellulitis.  He was asked to return today for unna boot placement b/l legs. Feeling well today, no complaints other than leg swelling.   PCP is Dr. Sara Chu.  His next coumadin level lab draw is scheduled for 11/10/15.  Review of Systems  Constitutional: Negative for chills, fatigue and fever.  Respiratory: Negative for cough, chest tightness and shortness of breath.   Cardiovascular: Positive for leg swelling. Negative for chest pain and palpitations.  Skin: Positive for color change and rash. Negative for pallor and wound.    Patient Active Problem List   Diagnosis Date Noted  . Essential hypertension 01/21/2014  . Encounter for therapeutic drug monitoring 02/05/2013  . Embolic stroke ([place]) 123XX123  . Long term current use of anticoagulant therapy 12/25/2012  . Atrial fibrillation ([place]) 10/15/2012  . Acute, but ill-defined, cerebrovascular disease 10/15/2012    Current Outpatient Prescriptions on File Prior to Visit  Medication Sig Dispense Refill  . aspirin 81 MG tablet Take 81 mg by mouth daily.    . calcium carbonate (OS-CAL) 600 MG TABS tablet Take 600 mg by mouth daily.     Marland Kitchen doxycycline (VIBRAMYCIN) 100 MG capsule Take 1 capsule (100 mg total) by mouth 2 (two) times daily. 20 capsule 0  . finasteride (PROSCAR) 5 MG tablet Take 5 mg by mouth daily.    . Glucosamine HCl (GLUCOSAMINE PO) Take 1,500 mg by mouth daily.     . hydrochlorothiazide (HYDRODIURIL) 12.5  MG tablet TAKE ONE TABLET BY MOUTH ONCE DAILY 90 tablet 2  . lisinopril (PRINIVIL,ZESTRIL) 10 MG tablet Take 10 mg by mouth daily.    . metoprolol succinate (TOPROL-XL) 50 MG 24 hr tablet Take 1 tablet (50 mg total) by mouth daily. Take with or immediately following a meal. 30 tablet 3  . Multiple Vitamin (MULTIVITAMIN) tablet Take 1 tablet by mouth daily.    . Multiple Vitamins-Minerals (PRESERVISION AREDS 2) CAPS Take 1 capsule by mouth 2 (two) times daily.    . tamsulosin (FLOMAX) 0.4 MG CAPS capsule Take 0.4 mg by mouth daily.     Marland Kitchen warfarin (COUMADIN) 5 MG tablet TAKE AS DIRECTED BY  ANTICOAGULATION  CLINIC 40 tablet 3   No current facility-administered medications on file prior to visit.     No Known Allergies  Objective:  There were no vitals taken for this visit.  Physical Exam  Constitutional: He is oriented to person, place, and time and well-developed, well-nourished, and in no distress. No distress.  Cardiovascular: Normal rate, regular rhythm and normal heart sounds.   Neurological: He is alert and oriented to person, place, and time. GCS score is 15.  Skin: Skin is warm, dry and intact. Petechiae noted. No abrasion and no lesion noted. There is erythema.  Cap refill <3 sec b/l toes. No ulcerations or draining appreciated. Minor desquamation of skin noted anterior left tibia. Petechia present b/l legs from tibial tuberosity to ankle. 2+ non-pitting edema from b/l lateral malleolus to lower 1/3  tibia.   Psychiatric: Mood, memory, affect and judgment normal.  Vitals reviewed.   Assessment and Plan :  1. Leg swelling - Applied unna boot to b/l legs, wrapped with ace wrap. Patient advised to keep dressings in place and dry. Elevate legs daily.  - Return to clinic in one week for wound care and dressing change. He understands and agrees. Patient will try to make an appointment to follow-up with his PCP, Dr. Dalbert Batman, instead of returning to [name]. Encouraged patient to make  appointment with PCP for follow-up no matter what.    Mercer Pod, PA-C  [facility] 10/20/2015 10:31 AM

## 2015-10-20 NOTE — Patient Instructions (Addendum)
Please keep your dressings dry, keep them wrapped in a plastic trash bag while you are bathing. Elevate your legs daily. Return to clinic in one week for wound care and dressing change. Please call or come back if your symptoms get worse.

## 2015-10-21 ENCOUNTER — Encounter (HOSPITAL_COMMUNITY): Payer: Self-pay | Admitting: Emergency Medicine

## 2015-10-21 ENCOUNTER — Emergency Department (HOSPITAL_COMMUNITY): Payer: Medicare Other

## 2015-10-21 ENCOUNTER — Observation Stay (HOSPITAL_COMMUNITY)
Admission: EM | Admit: 2015-10-21 | Discharge: 2015-10-22 | Disposition: A | Discharge status: Home / Self Care | Payer: Medicare Other | Attending: Internal Medicine | Admitting: Internal Medicine

## 2015-10-21 DIAGNOSIS — Z7982 Long term (current) use of aspirin: Secondary | ICD-10-CM | POA: Diagnosis not present

## 2015-10-21 DIAGNOSIS — I872 Venous insufficiency (chronic) (peripheral): Secondary | ICD-10-CM | POA: Diagnosis not present

## 2015-10-21 DIAGNOSIS — I480 Paroxysmal atrial fibrillation: Secondary | ICD-10-CM | POA: Diagnosis not present

## 2015-10-21 DIAGNOSIS — D696 Thrombocytopenia, unspecified: Secondary | ICD-10-CM | POA: Diagnosis not present

## 2015-10-21 DIAGNOSIS — I10 Essential (primary) hypertension: Secondary | ICD-10-CM | POA: Insufficient documentation

## 2015-10-21 DIAGNOSIS — Z87891 Personal history of nicotine dependence: Secondary | ICD-10-CM | POA: Insufficient documentation

## 2015-10-21 DIAGNOSIS — W19XXXA Unspecified fall, initial encounter: Secondary | ICD-10-CM | POA: Diagnosis not present

## 2015-10-21 DIAGNOSIS — R55 Syncope and collapse: Secondary | ICD-10-CM

## 2015-10-21 DIAGNOSIS — S065X9A Traumatic subdural hemorrhage with loss of consciousness of unspecified duration, initial encounter: Principal | ICD-10-CM | POA: Insufficient documentation

## 2015-10-21 DIAGNOSIS — R531 Weakness: Secondary | ICD-10-CM | POA: Insufficient documentation

## 2015-10-21 DIAGNOSIS — Z7901 Long term (current) use of anticoagulants: Secondary | ICD-10-CM | POA: Diagnosis not present

## 2015-10-21 DIAGNOSIS — Z8673 Personal history of transient ischemic attack (TIA), and cerebral infarction without residual deficits: Secondary | ICD-10-CM | POA: Insufficient documentation

## 2015-10-21 DIAGNOSIS — S065XAA Traumatic subdural hemorrhage with loss of consciousness status unknown, initial encounter: Secondary | ICD-10-CM

## 2015-10-21 DIAGNOSIS — I878 Other specified disorders of veins: Secondary | ICD-10-CM | POA: Diagnosis not present

## 2015-10-21 DIAGNOSIS — I4891 Unspecified atrial fibrillation: Secondary | ICD-10-CM | POA: Diagnosis present

## 2015-10-21 DIAGNOSIS — Z79899 Other long term (current) drug therapy: Secondary | ICD-10-CM | POA: Insufficient documentation

## 2015-10-21 DIAGNOSIS — S0990XA Unspecified injury of head, initial encounter: Secondary | ICD-10-CM

## 2015-10-21 DIAGNOSIS — I6200 Nontraumatic subdural hemorrhage, unspecified: Secondary | ICD-10-CM | POA: Diagnosis present

## 2015-10-21 LAB — CBC
HCT: 37.9 % — ABNORMAL LOW (ref 39.0–52.0)
Hemoglobin: 12.7 g/dL — ABNORMAL LOW (ref 13.0–17.0)
MCH: 29 pg (ref 26.0–34.0)
MCHC: 33.5 g/dL (ref 30.0–36.0)
MCV: 86.5 fL (ref 78.0–100.0)
PLATELETS: 109 10*3/uL — AB (ref 150–400)
RBC: 4.38 MIL/uL (ref 4.22–5.81)
RDW: 13.1 % (ref 11.5–15.5)
WBC: 5.9 10*3/uL (ref 4.0–10.5)

## 2015-10-21 LAB — BASIC METABOLIC PANEL
ANION GAP: 7 (ref 5–15)
BUN: 12 mg/dL (ref 6–20)
CALCIUM: 8.9 mg/dL (ref 8.9–10.3)
CO2: 28 mmol/L (ref 22–32)
Chloride: 100 mmol/L — ABNORMAL LOW (ref 101–111)
Creatinine, Ser: 1.02 mg/dL (ref 0.61–1.24)
Glucose, Bld: 134 mg/dL — ABNORMAL HIGH (ref 65–99)
POTASSIUM: 3.9 mmol/L (ref 3.5–5.1)
SODIUM: 135 mmol/L (ref 135–145)

## 2015-10-21 LAB — PROTIME-INR
INR: 2.14
PROTHROMBIN TIME: 24.3 s — AB (ref 11.4–15.2)

## 2015-10-21 MED ORDER — TETANUS-DIPHTH-ACELL PERTUSSIS 5-2.5-18.5 LF-MCG/0.5 IM SUSP
0.5000 mL | Freq: Once | INTRAMUSCULAR | Status: AC
  Administered 2015-10-22: 0.5 mL via INTRAMUSCULAR
  Filled 2015-10-21: qty 0.5

## 2015-10-21 NOTE — ED Triage Notes (Signed)
Patient had syncopal episode and [place]. Abrasion to left forehead and swelling. Denies pain. Doesn't remember anything before waking up in parking lot. Patient alert and oriented x 4. 136/78, 72 AFIB, 95% room air, CBG 120.

## 2015-10-21 NOTE — ED Provider Notes (Signed)
[facility] DEPT Provider Note   CSN: [id number] Arrival date & time: 10/21/15  2045     History   Chief Complaint Chief Complaint  Patient presents with  . Fall    HPI [name] is a 82 y.o. male.  HPI  Pt presenting after fall.  Per report of family he misstepped in the parking lot of church and then fell.  He hit his head and then had some LOC.  Very unclear history as to whether this was a syncopal episode or LOC after fall.  No chest pain, no palpitations, no severe headache prior to fall.  Currently has no complaints.  He has a hematoma/abrasion in left sided of forehead.  No vomiting, no seizure activity,  GCS 15.  No other areas of pain.  He was brought in by EMS.  No other treatment prior to arrival.  There are no other associated systemic symptoms, there are no other alleviating or modifying factors.   Past Medical History:  Diagnosis Date  . Atrial fibrillation ([place]) 10/15/2012  . Embolic stroke ([place]) 123XX123  . Essential hypertension 01/21/2014  . Long term current use of anticoagulant therapy 12/25/2012    Patient Active Problem List   Diagnosis Date Noted  . Subdural hematoma ([place]) 10/22/2015  . Thrombocytopenia ([place]) 10/22/2015  . Chronic venous stasis dermatitis 10/22/2015  . Fall   . Head injury   . Essential hypertension 01/21/2014  . Embolic stroke ([place]) 123XX123  . Long term current use of anticoagulant therapy 12/25/2012  . Atrial fibrillation ([place]) 10/15/2012  . Acute, but ill-defined, cerebrovascular disease 10/15/2012    History reviewed. No pertinent surgical history.  OB History    No data available       Home Medications    Prior to Admission medications   Medication Sig Start Date End Date Taking? Authorizing Provider  calcium carbonate (OS-CAL) 600 MG TABS tablet Take 600 mg by mouth daily.    Yes Historical Provider, MD  doxycycline (VIBRAMYCIN) 100 MG capsule Take 1 capsule (100 mg total) by mouth 2 (two) times daily.  10/19/15  Yes Sedalia Muta, FNP  finasteride (PROSCAR) 5 MG tablet Take 5 mg by mouth daily.   Yes Historical Provider, MD  Glucosamine HCl (GLUCOSAMINE PO) Take 1,500 mg by mouth daily.    Yes Historical Provider, MD  hydrochlorothiazide (HYDRODIURIL) 12.5 MG tablet TAKE ONE TABLET BY MOUTH ONCE DAILY 05/12/15  Yes Belva Crome, MD  lisinopril (PRINIVIL,ZESTRIL) 10 MG tablet Take 10 mg by mouth daily.   Yes Historical Provider, MD  metoprolol succinate (TOPROL-XL) 50 MG 24 hr tablet Take 1 tablet (50 mg total) by mouth daily. Take with or immediately following a meal. 09/04/15 12/03/15 Yes Belva Crome, MD  Multiple Vitamin (MULTIVITAMIN) tablet Take 1 tablet by mouth daily.   Yes Historical Provider, MD  Multiple Vitamins-Minerals (PRESERVISION AREDS 2) CAPS Take 1 capsule by mouth 2 (two) times daily.   Yes Historical Provider, MD  tamsulosin (FLOMAX) 0.4 MG CAPS capsule Take 0.4 mg by mouth daily.    Yes Historical Provider, MD  ondansetron (ZOFRAN) 4 MG tablet Take 1 tablet (4 mg total) by mouth every 6 (six) hours as needed for nausea. 10/22/15   Geradine Girt, DO    Family History Family History  Problem Relation Age of Onset  . Other Mother     Brain hemmorrhage  . Other Father     unknown    Social History Social History  Substance Use Topics  . Smoking status: Former Research scientist (life sciences)  . Smokeless tobacco: Never Used  . Alcohol use No     Allergies   Review of patient's allergies indicates no known allergies.   Review of Systems Review of Systems  ROS reviewed and all otherwise negative except for mentioned in HPI   Physical Exam Updated Vital Signs BP 122/63 (BP Location: Left Arm)   Pulse 100   Temp 98.5 F (36.9 C) (Oral)   Resp 18   Ht 5\' 10"  (1.778 m)   Wt 91.6 kg   SpO2 93%   BMI 28.98 kg/m  Vitals reviewed Physical Exam  Physical Examination: General appearance - alert, well appearing, and in no distress Mental status - alert, oriented to  person, place, and time Head- hematoma with overlying abrasion on left forehead Eyes - pupils equal and reactive, extraocular eye movements intact Mouth - mucous membranes moist, pharynx normal without lesions Neck - no midline tenderness to palpation, c-collar in place Chest - clear to auscultation, no wheezes, rales or rhonchi, symmetric air entry Heart - normal rate, regular rhythm, normal S1, S2, no murmurs, rubs, clicks or gallops Abdomen - soft, nontender, nondistended, no masses or organomegaly Back exam - no midline tenderness to palpation Neurological - alert, oriented x 3, GCS 15, strength 5/5 in extremities x 4, sensation intact Musculoskeletal - no joint tenderness, deformity or swelling Extremities - peripheral pulses normal, no pedal edema, no clubbing or cyanosis Skin - normal coloration and turgor, no rashes, abrasion over left forehead   ED Treatments / Results  Labs (all labs ordered are listed, but only abnormal results are displayed) Labs Reviewed  CBC - Abnormal; Notable for the following:       Result Value   Hemoglobin 12.7 (*)    HCT 37.9 (*)    Platelets 109 (*)    All other components within normal limits  BASIC METABOLIC PANEL - Abnormal; Notable for the following:    Chloride 100 (*)    Glucose, Bld 134 (*)    All other components within normal limits  PROTIME-INR - Abnormal; Notable for the following:    Prothrombin Time 24.3 (*)    All other components within normal limits  CBC WITH DIFFERENTIAL/PLATELET - Abnormal; Notable for the following:    HCT 38.9 (*)    Platelets 88 (*)    All other components within normal limits  PROTIME-INR - Abnormal; Notable for the following:    Prothrombin Time 23.7 (*)    All other components within normal limits  URINALYSIS, ROUTINE W REFLEX MICROSCOPIC (NOT AT [facility]) - Abnormal; Notable for the following:    Hgb urine dipstick TRACE (*)    All other components within normal limits  BASIC METABOLIC PANEL -  Abnormal; Notable for the following:    Calcium 8.7 (*)    All other components within normal limits  VITAMIN B12 - Abnormal; Notable for the following:    Vitamin B-12 69 (*)    All other components within normal limits  FOLATE RBC - Abnormal; Notable for the following:    Hematocrit 37.1 (*)    All other components within normal limits  HEPATIC FUNCTION PANEL - Abnormal; Notable for the following:    Albumin 3.1 (*)    All other components within normal limits  URINE MICROSCOPIC-ADD ON - Abnormal; Notable for the following:    Squamous Epithelial / LPF 0-5 (*)    Bacteria, UA RARE (*)    All  other components within normal limits  GLUCOSE, CAPILLARY  I-STAT TROPOININ, ED    EKG  EKG Interpretation  Date/Time:  Wednesday October 21 2015 22:05:49 EDT Ventricular Rate:  83 PR Interval:    QRS Duration: 92 QT Interval:  367 QTC Calculation: 432 R Axis:   79 Text Interpretation:  Atrial fibrillation Ventricular premature complex Baseline wander in lead(s) V2 afib is new compared to previous tracing Confirmed by Canary Brim  MD, MARTHA [phone]) on 10/21/2015 10:35:16 PM       Radiology No results found.  Procedures Procedures (including critical care time)  Medications Ordered in ED Medications  Tdap (BOOSTRIX) injection 0.5 mL (0.5 mLs Intramuscular Given 10/22/15 0003)     Initial Impression / Assessment and Plan / ED Course  I have reviewed the triage vital signs and the nursing notes.  Pertinent labs & imaging results that were available during my care of the patient were reviewed by me and considered in my medical decision making (see chart for details).  Clinical Course  12:04 AM d/w neurosurgery on call Dr. Ronnald Ramp, he will consult on patient tomorrow.  No acute intervention at this time.  Will admit to hospitalist service.   12:14 AM d/w Dr. Myna Hidalgo, triad, patient to be admitted to telemetry bed, observation.  Pt is agreeable with plan for admission and updated about  findings.     Final Clinical Impressions(s) / ED Diagnoses   Final diagnoses:  Subdural hematoma ([place])  Fall, initial encounter  Injury of head, initial encounter  Syncope, unspecified syncope type    New Prescriptions Discharge Medication List as of 10/22/2015  3:10 PM    START taking these medications   Details  ondansetron (ZOFRAN) 4 MG tablet Take 1 tablet (4 mg total) by mouth every 6 (six) hours as needed for nausea., Starting Thu 10/22/2015, Print         Alfonzo Beers, MD 10/24/15 [phone]

## 2015-10-22 ENCOUNTER — Encounter (HOSPITAL_COMMUNITY): Payer: Self-pay | Admitting: Family Medicine

## 2015-10-22 ENCOUNTER — Observation Stay (HOSPITAL_COMMUNITY): Payer: Medicare Other

## 2015-10-22 DIAGNOSIS — I6200 Nontraumatic subdural hemorrhage, unspecified: Secondary | ICD-10-CM | POA: Diagnosis not present

## 2015-10-22 DIAGNOSIS — Y92009 Unspecified place in unspecified non-institutional (private) residence as the place of occurrence of the external cause: Secondary | ICD-10-CM | POA: Insufficient documentation

## 2015-10-22 DIAGNOSIS — I10 Essential (primary) hypertension: Secondary | ICD-10-CM

## 2015-10-22 DIAGNOSIS — W19XXXA Unspecified fall, initial encounter: Secondary | ICD-10-CM | POA: Insufficient documentation

## 2015-10-22 DIAGNOSIS — I4891 Unspecified atrial fibrillation: Secondary | ICD-10-CM

## 2015-10-22 DIAGNOSIS — I872 Venous insufficiency (chronic) (peripheral): Secondary | ICD-10-CM | POA: Diagnosis not present

## 2015-10-22 DIAGNOSIS — Z7901 Long term (current) use of anticoagulants: Secondary | ICD-10-CM

## 2015-10-22 DIAGNOSIS — D696 Thrombocytopenia, unspecified: Secondary | ICD-10-CM | POA: Diagnosis present

## 2015-10-22 DIAGNOSIS — S0990XA Unspecified injury of head, initial encounter: Secondary | ICD-10-CM

## 2015-10-22 DIAGNOSIS — S065X9A Traumatic subdural hemorrhage with loss of consciousness of unspecified duration, initial encounter: Secondary | ICD-10-CM | POA: Diagnosis present

## 2015-10-22 DIAGNOSIS — S065XAA Traumatic subdural hemorrhage with loss of consciousness status unknown, initial encounter: Secondary | ICD-10-CM | POA: Diagnosis present

## 2015-10-22 DIAGNOSIS — R55 Syncope and collapse: Secondary | ICD-10-CM | POA: Diagnosis present

## 2015-10-22 LAB — CBC WITH DIFFERENTIAL/PLATELET
BASOS PCT: 0 %
Basophils Absolute: 0 10*3/uL (ref 0.0–0.1)
Eosinophils Absolute: 0.2 10*3/uL (ref 0.0–0.7)
Eosinophils Relative: 2 %
HEMATOCRIT: 38.9 % — AB (ref 39.0–52.0)
HEMOGLOBIN: 13.1 g/dL (ref 13.0–17.0)
LYMPHS ABS: 0.8 10*3/uL (ref 0.7–4.0)
LYMPHS PCT: 11 %
MCH: 29.2 pg (ref 26.0–34.0)
MCHC: 33.7 g/dL (ref 30.0–36.0)
MCV: 86.6 fL (ref 78.0–100.0)
MONOS PCT: 8 %
Monocytes Absolute: 0.6 10*3/uL (ref 0.1–1.0)
NEUTROS ABS: 5.8 10*3/uL (ref 1.7–7.7)
NEUTROS PCT: 78 %
Platelets: 88 10*3/uL — ABNORMAL LOW (ref 150–400)
RBC: 4.49 MIL/uL (ref 4.22–5.81)
RDW: 13.3 % (ref 11.5–15.5)
WBC: 7.4 10*3/uL (ref 4.0–10.5)

## 2015-10-22 LAB — BASIC METABOLIC PANEL
ANION GAP: 7 (ref 5–15)
BUN: 11 mg/dL (ref 6–20)
CO2: 28 mmol/L (ref 22–32)
Calcium: 8.7 mg/dL — ABNORMAL LOW (ref 8.9–10.3)
Chloride: 103 mmol/L (ref 101–111)
Creatinine, Ser: 0.95 mg/dL (ref 0.61–1.24)
GFR calc Af Amer: >60 mL/min (ref >60)
Glucose, Bld: 99 mg/dL (ref 65–99)
POTASSIUM: 4.3 mmol/L (ref 3.5–5.1)
SODIUM: 138 mmol/L (ref 135–145)

## 2015-10-22 LAB — URINE MICROSCOPIC-ADD ON: WBC UA: NONE SEEN WBC/hpf (ref 0–5)

## 2015-10-22 LAB — HEPATIC FUNCTION PANEL
ALBUMIN: 3.1 g/dL — AB (ref 3.5–5.0)
ALT: 35 U/L (ref 17–63)
AST: 36 U/L (ref 15–41)
Alkaline Phosphatase: 46 U/L (ref 38–126)
Bilirubin, Direct: 0.2 mg/dL (ref 0.1–0.5)
Indirect Bilirubin: 0.3 mg/dL (ref 0.3–0.9)
Total Bilirubin: 0.5 mg/dL (ref 0.3–1.2)
Total Protein: 7.2 g/dL (ref 6.5–8.1)

## 2015-10-22 LAB — URINALYSIS, ROUTINE W REFLEX MICROSCOPIC
Bilirubin Urine: NEGATIVE
GLUCOSE, UA: NEGATIVE mg/dL
Ketones, ur: NEGATIVE mg/dL
LEUKOCYTES UA: NEGATIVE
Nitrite: NEGATIVE
PROTEIN: NEGATIVE mg/dL
Specific Gravity, Urine: 1.01 (ref 1.005–1.030)
pH: 6 (ref 5.0–8.0)

## 2015-10-22 LAB — GLUCOSE, CAPILLARY: GLUCOSE-CAPILLARY: 94 mg/dL (ref 65–99)

## 2015-10-22 LAB — PROTIME-INR
INR: 2.08
Prothrombin Time: 23.7 seconds — ABNORMAL HIGH (ref 11.4–15.2)

## 2015-10-22 LAB — I-STAT TROPONIN, ED: Troponin i, poc: 0.01 ng/mL (ref 0.00–0.08)

## 2015-10-22 LAB — VITAMIN B12: VITAMIN B 12: 69 pg/mL — AB (ref 180–914)

## 2015-10-22 MED ORDER — SODIUM CHLORIDE 0.9 % IV SOLN
INTRAVENOUS | Status: DC
  Administered 2015-10-22: 02:00:00 via INTRAVENOUS

## 2015-10-22 MED ORDER — DOXYCYCLINE HYCLATE 100 MG PO TABS
100.0000 mg | ORAL_TABLET | Freq: Two times a day (BID) | ORAL | Status: DC
  Administered 2015-10-22: 100 mg via ORAL
  Filled 2015-10-22: qty 1

## 2015-10-22 MED ORDER — ONDANSETRON HCL 4 MG PO TABS: 4.0000 mg | ORAL_TABLET | Freq: Four times a day (QID) | ORAL | 0 refills | Status: DC | PRN

## 2015-10-22 MED ORDER — POLYETHYLENE GLYCOL 3350 17 G PO PACK: 17.0000 g | PACK | Freq: Every day | ORAL | Status: DC | PRN

## 2015-10-22 MED ORDER — PROSIGHT PO TABS
1.0000 | ORAL_TABLET | Freq: Every day | ORAL | Status: DC
  Administered 2015-10-22: 1 via ORAL
  Filled 2015-10-22: qty 1

## 2015-10-22 MED ORDER — FINASTERIDE 5 MG PO TABS
5.0000 mg | ORAL_TABLET | Freq: Every day | ORAL | Status: DC
  Administered 2015-10-22: 5 mg via ORAL
  Filled 2015-10-22: qty 1

## 2015-10-22 MED ORDER — TAMSULOSIN HCL 0.4 MG PO CAPS
0.4000 mg | ORAL_CAPSULE | Freq: Every day | ORAL | Status: DC
  Administered 2015-10-22: 0.4 mg via ORAL
  Filled 2015-10-22: qty 1

## 2015-10-22 MED ORDER — ACETAMINOPHEN 650 MG RE SUPP: 650.0000 mg | Freq: Four times a day (QID) | RECTAL | Status: DC | PRN

## 2015-10-22 MED ORDER — ACETAMINOPHEN 325 MG PO TABS: 650.0000 mg | ORAL_TABLET | Freq: Four times a day (QID) | ORAL | Status: DC | PRN

## 2015-10-22 MED ORDER — HYDROCODONE-ACETAMINOPHEN 5-325 MG PO TABS: 1.0000 | ORAL_TABLET | ORAL | Status: DC | PRN

## 2015-10-22 MED ORDER — BISACODYL 5 MG PO TBEC: 5.0000 mg | DELAYED_RELEASE_TABLET | Freq: Every day | ORAL | Status: DC | PRN

## 2015-10-22 MED ORDER — HYDROMORPHONE HCL 1 MG/ML IJ SOLN: 0.5000 mg | INTRAMUSCULAR | Status: DC | PRN

## 2015-10-22 MED ORDER — SODIUM CHLORIDE 0.9% FLUSH: 3.0000 mL | Freq: Two times a day (BID) | INTRAVENOUS | Status: DC

## 2015-10-22 MED ORDER — DOCUSATE SODIUM 100 MG PO CAPS
100.0000 mg | ORAL_CAPSULE | Freq: Two times a day (BID) | ORAL | Status: DC
  Administered 2015-10-22: 100 mg via ORAL
  Filled 2015-10-22 (×2): qty 1

## 2015-10-22 MED ORDER — HYDROCHLOROTHIAZIDE 25 MG PO TABS
12.5000 mg | ORAL_TABLET | Freq: Every day | ORAL | Status: DC
  Administered 2015-10-22: 12.5 mg via ORAL
  Filled 2015-10-22: qty 1

## 2015-10-22 MED ORDER — CALCIUM CARBONATE 1250 (500 CA) MG PO TABS
1250.0000 mg | ORAL_TABLET | Freq: Every day | ORAL | Status: DC
  Administered 2015-10-22: 1250 mg via ORAL
  Filled 2015-10-22: qty 1

## 2015-10-22 MED ORDER — LISINOPRIL 10 MG PO TABS
10.0000 mg | ORAL_TABLET | Freq: Every day | ORAL | Status: DC
  Administered 2015-10-22: 10 mg via ORAL
  Filled 2015-10-22: qty 1

## 2015-10-22 MED ORDER — METOPROLOL SUCCINATE ER 25 MG PO TB24
50.0000 mg | ORAL_TABLET | Freq: Every day | ORAL | Status: DC
  Administered 2015-10-22: 50 mg via ORAL
  Filled 2015-10-22: qty 2

## 2015-10-22 MED ORDER — ONDANSETRON HCL 4 MG PO TABS: 4.0000 mg | ORAL_TABLET | Freq: Four times a day (QID) | ORAL | Status: DC | PRN

## 2015-10-22 MED ORDER — ONDANSETRON HCL 4 MG/2ML IJ SOLN: 4.0000 mg | Freq: Four times a day (QID) | INTRAMUSCULAR | Status: DC | PRN

## 2015-10-22 NOTE — Progress Notes (Signed)
   10/22/15 0121  Vitals  Temp 98.2 F (36.8 C)  Temp Source Oral  BP (!) 143/72  BP Location Left Arm  BP Method Automatic  Patient Position (if appropriate) Lying  Pulse Rate 87  Pulse Rate Source Dinamap  Resp 20  Oxygen Therapy  SpO2 96 %  O2 Device Room Air  Height and Weight  Height 5\' 10"  (1.778 m)  Weight 91.6 kg (202 lb)  Type of Scale Used Standing  Type of Weight Actual  BSA (Calculated - sq m) 2.13 sq meters  BMI (Calculated) 29  Weight in (lb) to have BMI = 25 173.9  Admitted pt to rm 3E16 from ED, pt alert and oriented, denied pain at this time, oriented to room, call bell placed within reach, placed on cardiac monitor, CCMD made aware.

## 2015-10-22 NOTE — ED Notes (Signed)
Pt denies any pain or discomfort at this time.  Pt remains alert and oriented x's 3.  Skin warm and dry color appropriate

## 2015-10-22 NOTE — Care Management Note (Signed)
Case Management Note  Patient Details  Name: [name] MRN: [id number] Date of Birth: 08-Nov-1930  Subjective/Objective:      Admitted to Observation for Subdural Hematoma              Action/Plan: Patient lives at home with spouse. Physical Therapist recommends HHPT at discharge for strength building and balance training. HHC choice offered, pt/ spouse chose [facility]. [name] with [facility] called for arrangements. He has a cane and walker at home, lots of encouragements given to him to use his DME at discharge.  Expected Discharge Date:     10/22/2015             Expected Discharge Plan:  [place]   Discharge planning Services  CM Consult    Choice offered to:  Patient, Spouse, Adult Children  HH Arranged:    [place] Agency:  [place]  Status of Service:  In process, will continue to follow  [name] [phone] 10/22/2015, 2:29 PM

## 2015-10-22 NOTE — Evaluation (Signed)
Physical Therapy Evaluation Patient Details Name: [name] MRN: [id number] DOB: [birth year] Today's Date: 10/22/2015   History of Present Illness  82 y.o. male with medical history significant for hypertension, chronic thrombocytopenia, and atrial fibrillation on warfarin who presents to the emergency department after a mechanical fall with head strike and loss of consciousness. Patient reports that he was in his usual state of fairly good health throughout the day today and was loading items into his car after an event at his church. While out in the parking lot, patient describes attempting to step to his right side, but his foot got caught and he fell, striking his forehead on the cement.    Clinical Impression  Pt admitted with above diagnosis. Patient is a high fall risk with at least 4 falls recalled by family over the past year. Son reports his gait has declined in recent months with more "shuffling" of his feet. Has borrowed a RW, however has not ever had instruction in proper use or Physical Therapy to address balance/gait deficits. Patient very motivated and able to follow instructions for altering gait pattern. Pt currently with functional limitations due to the deficits listed below (see PT Problem List).  Gait also limited by fatigue and elevated HR (up to 138 bpm) with limited ambulation. Pt will benefit from skilled PT to increase their independence and safety with mobility     Follow Up Recommendations Home health PT;Supervision/Assistance - 24 hour    Equipment Recommendations  None recommended by PT    Recommendations for Other Services       Precautions / Restrictions Precautions Precautions: Fall Precaution Comments: wife and son recalled 4 different falls in the past 12 months      Mobility  Bed Mobility               General bed mobility comments: up in recliner  Transfers Overall transfer level: Needs assistance Equipment used: Rolling walker (2  wheeled) Transfers: Sit to/from Stand Sit to Stand: Min guard         General transfer comment: vc for safe use of RW; guarding assist for safety due to decr balance  Ambulation/Gait Ambulation/Gait assistance: Min assist;Min guard Ambulation Distance (Feet): 100 Feet Assistive device: Rolling walker (2 wheeled) Gait Pattern/deviations: Step-through pattern;Decreased stride length;Shuffle;Trunk flexed Gait velocity: slow Gait velocity interpretation: Below normal speed for age/gender General Gait Details: foot flat and shuffling; able to improve foot clearance and heelstrike with vc; max cues for safe use of RW with turns and in tight spaces  Stairs            Wheelchair Mobility    Modified Rankin (Stroke Patients Only)       Balance Overall balance assessment: Needs assistance;History of Falls         Standing balance support: During functional activity;No upper extremity supported Standing balance-Leahy Scale: Poor Standing balance comment: appears weak and unsteady with static stance requiring min guard assist                             Pertinent Vitals/Pain Pain Assessment: No/denies pain    Home Living Family/patient expects to be discharged to:: Private residence Living Arrangements: Spouse/significant other Available Help at Discharge: Family;Available 24 hours/day (son nearby and involved) Type of Home: House Home Access: Stairs to enter Entrance Stairs-Rails: Right Entrance Stairs-Number of Steps: 2 Home Layout: One level Home Equipment: Walker - 2 wheels;Cane - single point;Hand held  shower head;Electric scooter Additional Comments: RW is borrowed; never educated on the use per son    Prior Function Level of Independence: Needs assistance   Gait / Transfers Assistance Needed: "been shuffling more lately" "doesnt always use the RW but he should" uses electric scooter vs golf cart on property           Hand Dominance         Extremity/Trunk Assessment   Upper Extremity Assessment: Generalized weakness           Lower Extremity Assessment: Generalized weakness;RLE deficits/detail;LLE deficits/detail RLE Deficits / Details: had unna boot wraps applied Wed for the first time. Is not accustomed to the "feel" and decr sensation of foot contact with the floor or ankle ROM being limited LLE Deficits / Details: had unna boot wraps applied Wed for the first time. Is not accustomed to the "feel" and decr sensation of foot contact with the floor or ankle ROM being limited  Cervical / Trunk Assessment: Kyphotic  Communication   Communication: HOH  Cognition Arousal/Alertness: Awake/alert Behavior During Therapy: WFL for tasks assessed/performed (masked-face appearance ) Overall Cognitive Status: History of cognitive impairments - at baseline (educated family on possible cognitive changes after TBI)       Memory: Decreased short-term memory              General Comments General comments (skin integrity, edema, etc.): Wife and son present. Educated throughout the session on use of DME, need for close guarding (another fall could be life-threatening), and possible side effects of mild TBI    Exercises     Assessment/Plan    PT Assessment All further PT needs can be met in the next venue of care  PT Problem List Decreased strength;Decreased range of motion;Decreased activity tolerance;Decreased balance;Decreased mobility;Decreased cognition;Decreased knowledge of use of DME;Decreased safety awareness;Cardiopulmonary status limiting activity;Impaired sensation          PT Treatment Interventions      PT Goals (Current goals can be found in the Care Plan section)  Acute Rehab PT Goals Patient Stated Goal: be able to walk without RW PT Goal Formulation: All assessment and education complete, DC therapy    Frequency     Barriers to discharge        Co-evaluation               End of Session  Equipment Utilized During Treatment: Gait belt Activity Tolerance: Patient limited by fatigue Patient left: in chair;with call bell/phone within reach;with family/visitor present Nurse Communication: Mobility status ([place] for d/c with HHPT; 24 hr care)         Time: 1340-1410 PT Time Calculation (min) (ACUTE ONLY): 30 min   Charges:   PT Evaluation $PT Eval Moderate Complexity: 1 Procedure PT Treatments $Gait Training: 8-22 mins   PT G Codes:        Kanden Carey 11-19-2015, 2:32 PM Pager [phone]

## 2015-10-22 NOTE — H&P (Signed)
History and Physical    [name] [id number] DOB: 11/19/1930 DOA: 10/21/2015  PCP: Simona Huh, MD   Patient coming from: Home  Chief Complaint: Fall with head strike and LOC   HPI: [name] is a 82 y.o. male with medical history significant for hypertension, chronic thrombocytopenia, and atrial fibrillation on warfarin who presents to the emergency department after a mechanical fall with head strike and loss of consciousness. Patient reports that he was in his usual state of fairly good health throughout the day today and was loading items into his car after an event at his church. While out in the parking lot, patient describes attempting to step to his right side, but his foot got caught and he fell, striking his forehead on the cement. Patient lost consciousness and woke to find other church members around him in the parking lot. Patient denies any preceding chest pain or palpitations, denies headache, change in vision or hearing, loss of coordination, or focal numbness or weakness. Patient denies nausea or vomiting.  ED Course: Upon arrival to the ED, patient is found to be afebrile, saturating well on room air, and with vital signs stable. EKG features atrial fibrillation with a PVC. Chemistry panel is unremarkable and CBC is notable for a mild normocytic anemia and a chronic thrombocytopenia. INR is in the therapeutic range at 2.14. Noncontrast head CT features a small acute left frontal subdural hematoma with maximal thickness of 7 mm. There is no skull fracture or midline shift on the head CT and there is no acute pathology on CT of cervical spine. Neurosurgery was consulted by the ED physician and advised a medical admission and recommended holding the patient's Coumadin. Tdap was updated in the ED, patient remained hemodynamically stable, and no focal neurologic deficits were elicited. He will be observed on the telemetry unit for ongoing evaluation and management of small  acute traumatic subdural hematoma in a patient on Coumadin.  Review of Systems:  All other systems reviewed and apart from HPI, are negative.  Past Medical History:  Diagnosis Date  . Atrial fibrillation ([place]) 10/15/2012  . Embolic stroke ([place]) 123XX123  . Essential hypertension 01/21/2014  . Long term current use of anticoagulant therapy 12/25/2012    History reviewed. No pertinent surgical history.   reports that he has quit smoking. He has never used smokeless tobacco. He reports that he does not drink alcohol or use drugs.  No Known Allergies  Family History  Problem Relation Age of Onset  . Other Mother     Brain hemmorrhage  . Other Father     unknown     Prior to Admission medications   Medication Sig Start Date End Date Taking? Authorizing Provider  aspirin 81 MG tablet Take 81 mg by mouth daily.   Yes Historical Provider, MD  calcium carbonate (OS-CAL) 600 MG TABS tablet Take 600 mg by mouth daily.    Yes Historical Provider, MD  doxycycline (VIBRAMYCIN) 100 MG capsule Take 1 capsule (100 mg total) by mouth 2 (two) times daily. 10/19/15  Yes Sedalia Muta, FNP  finasteride (PROSCAR) 5 MG tablet Take 5 mg by mouth daily.   Yes Historical Provider, MD  Glucosamine HCl (GLUCOSAMINE PO) Take 1,500 mg by mouth daily.    Yes Historical Provider, MD  hydrochlorothiazide (HYDRODIURIL) 12.5 MG tablet TAKE ONE TABLET BY MOUTH ONCE DAILY 05/12/15  Yes Belva Crome, MD  lisinopril (PRINIVIL,ZESTRIL) 10 MG tablet Take 10 mg by mouth daily.  Yes Historical Provider, MD  metoprolol succinate (TOPROL-XL) 50 MG 24 hr tablet Take 1 tablet (50 mg total) by mouth daily. Take with or immediately following a meal. 09/04/15 12/03/15 Yes Belva Crome, MD  Multiple Vitamin (MULTIVITAMIN) tablet Take 1 tablet by mouth daily.   Yes Historical Provider, MD  Multiple Vitamins-Minerals (PRESERVISION AREDS 2) CAPS Take 1 capsule by mouth 2 (two) times daily.   Yes Historical Provider, MD    tamsulosin (FLOMAX) 0.4 MG CAPS capsule Take 0.4 mg by mouth daily.    Yes Historical Provider, MD  warfarin (COUMADIN) 5 MG tablet Take 5-7.5 mg by mouth daily. Take 5mg  every day except on Wed and Saturday take 7.5mg    Yes Historical Provider, MD  warfarin (COUMADIN) 5 MG tablet TAKE AS DIRECTED BY  ANTICOAGULATION  CLINIC Patient not taking: Reported on 10/21/2015 09/07/15   Belva Crome, MD    Physical Exam: Vitals:   10/21/15 2103 10/21/15 2115 10/21/15 2336 10/21/15 2345  BP: 146/80 132/73 146/78 143/88  Pulse: 102 82 76 90  Resp: 18 23 20 22   Temp: 98.1 F (36.7 C)     TempSrc: Oral     SpO2: 95% 93% 96% 94%  Weight:          Constitutional: NAD, calm, comfortable, swelling and abrasion over left eye Eyes: PERTLA, lids and conjunctivae normal ENMT: Mucous membranes are dry. Posterior pharynx clear of any exudate or lesions.   Neck: normal, supple, no masses, no thyromegaly Respiratory: clear to auscultation bilaterally, no wheezing, no crackles. Normal respiratory effort.   Cardiovascular: Rate ~80 and irregular. 1+ pretibial edema b/l. No significant JVD. Abdomen: No distension, no tenderness, no masses palpated. Bowel sounds normal.  Musculoskeletal: no clubbing / cyanosis. No joint deformity upper and lower extremities. Normal muscle tone.  Skin: Superficial abrasion at left forehead. Warm, dry, well-perfused. Neurologic: CN 2-12 grossly intact. Sensation intact, DTR normal. Strength 5/5 in all 4 limbs.  Psychiatric: Normal judgment and insight. Alert and oriented x 3. Normal mood and affect.     Labs on Admission: I have personally reviewed following labs and imaging studies  CBC:  Recent Labs Lab 10/21/15 2202  WBC 5.9  HGB 12.7*  HCT 37.9*  MCV 86.5  PLT 0000000*   Basic Metabolic Panel:  Recent Labs Lab 10/21/15 2202  NA 135  K 3.9  CL 100*  CO2 28  GLUCOSE 134*  BUN 12  CREATININE 1.02  CALCIUM 8.9   GFR: Estimated Creatinine Clearance (by  C-G formula based on SCr of 1.02 mg/dL) Male: 49.7 mL/min Male: 60.5 mL/min Liver Function Tests: No results for input(s): AST, ALT, ALKPHOS, BILITOT, PROT, ALBUMIN in the last 168 hours. No results for input(s): LIPASE, AMYLASE in the last 168 hours. No results for input(s): AMMONIA in the last 168 hours. Coagulation Profile:  Recent Labs Lab 10/21/15 2202  INR 2.14   Cardiac Enzymes: No results for input(s): CKTOTAL, CKMB, CKMBINDEX, TROPONINI in the last 168 hours. BNP (last 3 results) No results for input(s): PROBNP in the last 8760 hours. HbA1C: No results for input(s): HGBA1C in the last 72 hours. CBG: No results for input(s): GLUCAP in the last 168 hours. Lipid Profile: No results for input(s): CHOL, HDL, LDLCALC, TRIG, CHOLHDL, LDLDIRECT in the last 72 hours. Thyroid Function Tests: No results for input(s): TSH, T4TOTAL, FREET4, T3FREE, THYROIDAB in the last 72 hours. Anemia Panel: No results for input(s): VITAMINB12, FOLATE, FERRITIN, TIBC, IRON, RETICCTPCT in the last 72 hours.  Urine analysis: No results found for: COLORURINE, APPEARANCEUR, LABSPEC, PHURINE, GLUCOSEU, HGBUR, BILIRUBINUR, KETONESUR, PROTEINUR, UROBILINOGEN, NITRITE, LEUKOCYTESUR Sepsis Labs: @LABRCNTIP (procalcitonin:4,lacticidven:4) )No results found for this or any previous visit (from the past 240 hour(s)).   Radiological Exams on Admission: Ct Head Wo Contrast  Result Date: 10/21/2015 CLINICAL DATA:  Syncopal episode, fall in church parking lot. Abrasions. EXAM: CT HEAD WITHOUT CONTRAST CT CERVICAL SPINE WITHOUT CONTRAST TECHNIQUE: Multidetector CT imaging of the head and cervical spine was performed following the standard protocol without intravenous contrast. Multiplanar CT image reconstructions of the cervical spine were also generated. COMPARISON:  CT 05/01/2010 FINDINGS: CT HEAD FINDINGS Brain: Acute left frontal subdural hematoma measures 7 mm in thickness. No associated mass effect. There is  no significant midline shift. No subarachnoid or intraventricular hemorrhage. Stable atrophy. Focal encephalomalacia left occipital lobe again seen. Cavum septum pellucidum again seen. Remote lacunar infarcts in the right caudate unchanged. Vascular: Atherosclerosis of skullbase vasculature without hyperdense vessel. Skull: No calvarial fracture. Sinuses/Orbits: Remote right face fractures. Minimal mucosal thickening of the left maxillary sinus with unchanged mucous retention cyst. Mastoid air cells are clear. Other: Small left scalp hematoma. CT CERVICAL SPINE FINDINGS Alignment: Mild anterolisthesis of C3 on C4, mildly increased from prior. No acute traumatic subluxation. Skull base and vertebrae: No acute fracture. No primary bone lesion or focal pathologic process. Soft tissues and spinal canal: No prevertebral fluid or swelling. No visible canal hematoma. Disc levels: Diffuse disc space narrowing, advanced at C4-C5 through C6-C7. Associated endplate spurs. Multilevel facet arthropathy. Upper chest: Emphysema. Other: Carotid atherosclerosis. IMPRESSION: 1. Small acute left frontal subdural hematoma, 7 mm maximal thickness. No skull fracture. No midline shift. Stable atrophy and chronic ischemic change. 2. Multilevel degenerative change in the cervical spine, mildly progressed from 2012. No acute fracture or subluxation. Critical Value/emergent results were called by telephone at the time of interpretation on 10/21/2015 at 11:17 pm to Dr. Alfonzo Beers , who verbally acknowledged these results. Electronically Signed   By: Jeb Levering M.D.   On: 10/21/2015 23:18   Ct Cervical Spine Wo Contrast  Result Date: 10/21/2015 CLINICAL DATA:  Syncopal episode, fall in church parking lot. Abrasions. EXAM: CT HEAD WITHOUT CONTRAST CT CERVICAL SPINE WITHOUT CONTRAST TECHNIQUE: Multidetector CT imaging of the head and cervical spine was performed following the standard protocol without intravenous contrast.  Multiplanar CT image reconstructions of the cervical spine were also generated. COMPARISON:  CT 05/01/2010 FINDINGS: CT HEAD FINDINGS Brain: Acute left frontal subdural hematoma measures 7 mm in thickness. No associated mass effect. There is no significant midline shift. No subarachnoid or intraventricular hemorrhage. Stable atrophy. Focal encephalomalacia left occipital lobe again seen. Cavum septum pellucidum again seen. Remote lacunar infarcts in the right caudate unchanged. Vascular: Atherosclerosis of skullbase vasculature without hyperdense vessel. Skull: No calvarial fracture. Sinuses/Orbits: Remote right face fractures. Minimal mucosal thickening of the left maxillary sinus with unchanged mucous retention cyst. Mastoid air cells are clear. Other: Small left scalp hematoma. CT CERVICAL SPINE FINDINGS Alignment: Mild anterolisthesis of C3 on C4, mildly increased from prior. No acute traumatic subluxation. Skull base and vertebrae: No acute fracture. No primary bone lesion or focal pathologic process. Soft tissues and spinal canal: No prevertebral fluid or swelling. No visible canal hematoma. Disc levels: Diffuse disc space narrowing, advanced at C4-C5 through C6-C7. Associated endplate spurs. Multilevel facet arthropathy. Upper chest: Emphysema. Other: Carotid atherosclerosis. IMPRESSION: 1. Small acute left frontal subdural hematoma, 7 mm maximal thickness. No skull fracture. No midline shift. Stable atrophy  and chronic ischemic change. 2. Multilevel degenerative change in the cervical spine, mildly progressed from 2012. No acute fracture or subluxation. Critical Value/emergent results were called by telephone at the time of interpretation on 10/21/2015 at 11:17 pm to Dr. Alfonzo Beers , who verbally acknowledged these results. Electronically Signed   By: Jeb Levering M.D.   On: 10/21/2015 23:18    EKG: Independently reviewed. Atrial fibrillation, VPC  Assessment/Plan  1. Subdural hematoma  -  Acute left frontal SDH with maximal thickness 7 mm, no midline shift - Neurosurgery is consulting and much appreciated, advised holding warfarin  - No focal neurologic deficits elicited on admission and no N/V  - Hold coumadin, frequent neuro checks  - May need repeat CT head, will follow-up on NSG recommendations    2. Mechanical fall  - Pt reports catching his foot while trying to load items into his car, causing him to fall  - Pt remembers tripping, despite a nursing note that reports this as a syncopal episode   3. Atrial fibrillation  - CHADS-VASc is at least 58 (age x2, CVA x2, HTN)  - He is anticoagulated with warfarin and INR is 2.14 on admission; warfarin held on admission in setting of acute SDH - Rate is well-controlled on metoprolol, will continue  - Monitoring on telemetry    4. Hypertension  - At goal currently  - Continue Toprol, lisinopril, and HCTZ    5. Thrombocytopenia  - Platelets 109,000 on admission - Uncertain etiology; has been chronically low - Check B12, folate, and LFT's   6. Chronic venous stasis - Managed with compression wraps - Currently on doxycycline since 10/9 for a suspected secondary cellulitis, will continue  - No systemic infectious features    DVT prophylaxis: On warfarin, held at admission Code Status: Full  Family Communication: Wife and son updated at bedside Disposition Plan: Observe on telemetry Consults called: Neurosurgery Admission status: Observation    Vianne Bulls, MD [facility] Pager [phone]  If 7PM-7AM, please contact night-coverage [email or web address] Password [facility]  10/22/2015, 12:34 AM

## 2015-10-22 NOTE — Discharge Summary (Addendum)
Physician Discharge Summary  [name] [id number] DOB: [birth year] DOA: 10/21/2015  PCP: Danny Huh, MD  Admit date: 10/21/2015 Discharge date: 10/22/2015   Recommendations for Outpatient Follow-Up:   1. Can resume ASA in 5 days-- do not resume coumadin until seen by Dr. Ronnald Mcdonald in 2 weeks 2. Cbc 1 week re plts-- outpatient replacement of vitamin B12-- low here (69)   Discharge Diagnosis:   Principal Problem:   Subdural hematoma (HCC) Active Problems:   Atrial fibrillation (HCC)   Long term current use of anticoagulant therapy   Essential hypertension   Thrombocytopenia (HCC)   Chronic venous stasis dermatitis   Discharge disposition:  Home.  Discharge Condition: Improved.  Diet recommendation: Low sodium, heart healthy.  Wound care: None.   History of Present Illness:   [name] is a 82 y.o. male with medical history significant for hypertension, chronic thrombocytopenia, and atrial fibrillation on warfarin who presents to the emergency department after a mechanical fall with head strike and loss of consciousness. Patient reports that he was in his usual state of fairly good health throughout the day today and was loading items into his car after an event at his church. While out in the parking lot, patient describes attempting to step to his right side, but his foot got caught and he fell, striking his forehead on the cement. Patient lost consciousness and woke to find other church members around him in the parking lot. Patient denies any preceding chest pain or palpitations, denies headache, change in vision or hearing, loss of coordination, or focal numbness or weakness. Patient denies nausea or vomiting.   Hospital Course by Problem:    Subdural hematoma  - Acute left frontal SDH with maximal thickness 7 mm, no midline shift- replete CT stable - Neurosurgery is consulting and much appreciated- resume ASA 5 days and hold coumadin at least 2 weeks -  No focal neurologic deficits elicited on admission and no N/V   Low B12 -defer IM injections to PCP  Mechanical fall  Await PT Eval    Atrial fibrillation  - CHADS-VASc is at least 50 (age x2, CVA x2, HTN)  -see above for anti-coagulation    Hypertension  - At goal currently  - Continue Toprol, lisinopril, and HCTZ    Thrombocytopenia  - Platelets 109,000 on admission - Uncertain etiology; has been chronically low - Cbc 1 week with PCP  Chronic venous stasis - Managed with compression wraps - Currently on doxycycline since 10/9 for a suspected secondary cellulitis, will continue  - No systemic infectious features   Few beats of v tac- asymptomatic -on BB -K replaced > 4 -patient wants no further work up -outpatient follow up   Medical Consultants:    NS   Discharge Exam:   Vitals:   10/22/15 0806 10/22/15 1151  BP: 129/75 122/63  Pulse: 94 100  Resp: 18 18  Temp: 98.9 F (37.2 C) 98.5 F (36.9 C)   Vitals:   10/22/15 0121 10/22/15 0532 10/22/15 0806 10/22/15 1151  BP: (!) 143/72 134/78 129/75 122/63  Pulse: 87 77 94 100  Resp: 20 18 18 18   Temp: 98.2 F (36.8 C) 98.4 F (36.9 C) 98.9 F (37.2 C) 98.5 F (36.9 C)  TempSrc: Oral Oral Oral Oral  SpO2: 96% 97% 95% 93%  Weight: 91.6 kg (202 lb)     Height: 5\' 10"  (1.778 m)       Gen:  NAD    The results of  significant diagnostics from this hospitalization (including imaging, microbiology, ancillary and laboratory) are listed below for reference.     Procedures and Diagnostic Studies:   Ct Head Wo Contrast  Result Date: 10/22/2015 CLINICAL DATA:  Subdural hematoma EXAM: CT HEAD WITHOUT CONTRAST TECHNIQUE: Contiguous axial images were obtained from the base of the skull through the vertex without intravenous contrast. COMPARISON:  10/21/2015 FINDINGS: Brain: Left frontal subdural hematoma unchanged measuring 6 mm in thickness. No other areas of intracranial hemorrhage. No shift of the midline  structures. Generalized atrophy. Chronic infarct left medial occipital lobe unchanged. No acute infarct or mass Vascular: No hyperdense vessel or unexpected calcification. Skull: Negative Sinuses/Orbits: Mild mucosal edema paranasal sinuses. Normal orbit bilaterally. Other: None IMPRESSION: 6 mm left frontal subdural hematoma unchanged. No shift of the midline structures. Electronically Signed   By: [name] M.D.   On: 10/22/2015 11:05   Ct Head Wo Contrast  Result Date: 10/21/2015 CLINICAL DATA:  Syncopal episode, fall in church parking lot. Abrasions. EXAM: CT HEAD WITHOUT CONTRAST CT CERVICAL SPINE WITHOUT CONTRAST TECHNIQUE: Multidetector CT imaging of the head and cervical spine was performed following the standard protocol without intravenous contrast. Multiplanar CT image reconstructions of the cervical spine were also generated. COMPARISON:  CT 05/01/2010 FINDINGS: CT HEAD FINDINGS Brain: Acute left frontal subdural hematoma measures 7 mm in thickness. No associated mass effect. There is no significant midline shift. No subarachnoid or intraventricular hemorrhage. Stable atrophy. Focal encephalomalacia left occipital lobe again seen. Cavum septum pellucidum again seen. Remote lacunar infarcts in the right caudate unchanged. Vascular: Atherosclerosis of skullbase vasculature without hyperdense vessel. Skull: No calvarial fracture. Sinuses/Orbits: Remote right face fractures. Minimal mucosal thickening of the left maxillary sinus with unchanged mucous retention cyst. Mastoid air cells are clear. Other: Small left scalp hematoma. CT CERVICAL SPINE FINDINGS Alignment: Mild anterolisthesis of C3 on C4, mildly increased from prior. No acute traumatic subluxation. Skull base and vertebrae: No acute fracture. No primary bone lesion or focal pathologic process. Soft tissues and spinal canal: No prevertebral fluid or swelling. No visible canal hematoma. Disc levels: Diffuse disc space narrowing, advanced at  C4-C5 through C6-C7. Associated endplate spurs. Multilevel facet arthropathy. Upper chest: Emphysema. Other: Carotid atherosclerosis. IMPRESSION: 1. Small acute left frontal subdural hematoma, 7 mm maximal thickness. No skull fracture. No midline shift. Stable atrophy and chronic ischemic change. 2. Multilevel degenerative change in the cervical spine, mildly progressed from 2012. No acute fracture or subluxation. Critical Value/emergent results were called by telephone at the time of interpretation on 10/21/2015 at 11:17 pm to Dr. Alfonzo Beers , who verbally acknowledged these results. Electronically Signed   By: Jeb Levering M.D.   On: 10/21/2015 23:18   Ct Cervical Spine Wo Contrast  Result Date: 10/21/2015 CLINICAL DATA:  Syncopal episode, fall in church parking lot. Abrasions. EXAM: CT HEAD WITHOUT CONTRAST CT CERVICAL SPINE WITHOUT CONTRAST TECHNIQUE: Multidetector CT imaging of the head and cervical spine was performed following the standard protocol without intravenous contrast. Multiplanar CT image reconstructions of the cervical spine were also generated. COMPARISON:  CT 05/01/2010 FINDINGS: CT HEAD FINDINGS Brain: Acute left frontal subdural hematoma measures 7 mm in thickness. No associated mass effect. There is no significant midline shift. No subarachnoid or intraventricular hemorrhage. Stable atrophy. Focal encephalomalacia left occipital lobe again seen. Cavum septum pellucidum again seen. Remote lacunar infarcts in the right caudate unchanged. Vascular: Atherosclerosis of skullbase vasculature without hyperdense vessel. Skull: No calvarial fracture. Sinuses/Orbits: Remote right face fractures. Minimal mucosal  thickening of the left maxillary sinus with unchanged mucous retention cyst. Mastoid air cells are clear. Other: Small left scalp hematoma. CT CERVICAL SPINE FINDINGS Alignment: Mild anterolisthesis of C3 on C4, mildly increased from prior. No acute traumatic subluxation. Skull base  and vertebrae: No acute fracture. No primary bone lesion or focal pathologic process. Soft tissues and spinal canal: No prevertebral fluid or swelling. No visible canal hematoma. Disc levels: Diffuse disc space narrowing, advanced at C4-C5 through C6-C7. Associated endplate spurs. Multilevel facet arthropathy. Upper chest: Emphysema. Other: Carotid atherosclerosis. IMPRESSION: 1. Small acute left frontal subdural hematoma, 7 mm maximal thickness. No skull fracture. No midline shift. Stable atrophy and chronic ischemic change. 2. Multilevel degenerative change in the cervical spine, mildly progressed from 2012. No acute fracture or subluxation. Critical Value/emergent results were called by telephone at the time of interpretation on 10/21/2015 at 11:17 pm to Dr. Alfonzo Beers , who verbally acknowledged these results. Electronically Signed   By: Jeb Levering M.D.   On: 10/21/2015 23:18     Labs:   Basic Metabolic Panel:  Recent Labs Lab 10/21/15 2202 10/22/15 0635  NA 135 138  K 3.9 4.3  CL 100* 103  CO2 28 28  GLUCOSE 134* 99  BUN 12 11  CREATININE 1.02 0.95  CALCIUM 8.9 8.7*   GFR Estimated Creatinine Clearance (by C-G formula based on SCr of 0.95 mg/dL) Male: 55 mL/min Male: 67 mL/min Liver Function Tests:  Recent Labs Lab 10/22/15 0635  AST 36  ALT 35  ALKPHOS 46  BILITOT 0.5  PROT 7.2  ALBUMIN 3.1*   No results for input(s): LIPASE, AMYLASE in the last 168 hours. No results for input(s): AMMONIA in the last 168 hours. Coagulation profile  Recent Labs Lab 10/21/15 2202 10/22/15 0635  INR 2.14 2.08    CBC:  Recent Labs Lab 10/21/15 2202 10/22/15 0635  WBC 5.9 7.4  NEUTROABS  --  5.8  HGB 12.7* 13.1  HCT 37.9* 38.9*  MCV 86.5 86.6  PLT 109* 88*   Cardiac Enzymes: No results for input(s): CKTOTAL, CKMB, CKMBINDEX, TROPONINI in the last 168 hours. BNP: Invalid input(s): POCBNP CBG:  Recent Labs Lab 10/22/15 0802  GLUCAP 94   D-Dimer No  results for input(s): DDIMER in the last 72 hours. Hgb A1c No results for input(s): HGBA1C in the last 72 hours. Lipid Profile No results for input(s): CHOL, HDL, LDLCALC, TRIG, CHOLHDL, LDLDIRECT in the last 72 hours. Thyroid function studies No results for input(s): TSH, T4TOTAL, T3FREE, THYROIDAB in the last 72 hours.  Invalid input(s): FREET3 Anemia work up  Recent Labs  10/22/15 [place] 69*   Microbiology No results found for this or any previous visit (from the past 240 hour(s)).   Discharge Instructions:   Discharge Instructions    Diet - low sodium heart healthy    Complete by:  As directed    Discharge instructions    Complete by:  As directed    Can resume ASA in 5 days-- DO NOT RESUME coumadin/warfarin until seen by Dr. Ronnald Mcdonald in 2 weeks   Increase activity slowly    Complete by:  As directed        Medication List    STOP taking these medications   aspirin 81 MG tablet   warfarin 5 MG tablet Commonly known as:  COUMADIN     TAKE these medications   calcium carbonate 600 MG Tabs tablet Commonly known as:  OS-CAL Take 600 mg by  mouth daily.   doxycycline 100 MG capsule Commonly known as:  VIBRAMYCIN Take 1 capsule (100 mg total) by mouth 2 (two) times daily.   finasteride 5 MG tablet Commonly known as:  PROSCAR Take 5 mg by mouth daily.   GLUCOSAMINE PO Take 1,500 mg by mouth daily.   hydrochlorothiazide 12.5 MG tablet Commonly known as:  HYDRODIURIL TAKE ONE TABLET BY MOUTH ONCE DAILY   lisinopril 10 MG tablet Commonly known as:  PRINIVIL,ZESTRIL Take 10 mg by mouth daily.   metoprolol succinate 50 MG 24 hr tablet Commonly known as:  TOPROL-XL Take 1 tablet (50 mg total) by mouth daily. Take with or immediately following a meal.   multivitamin tablet Take 1 tablet by mouth daily.   ondansetron 4 MG tablet Commonly known as:  ZOFRAN Take 1 tablet (4 mg total) by mouth every 6 (six) hours as needed for nausea.     PRESERVISION AREDS 2 Caps Take 1 capsule by mouth 2 (two) times daily.   tamsulosin 0.4 MG Caps capsule Commonly known as:  FLOMAX Take 0.4 mg by mouth daily.      Follow-up Information    Danny Huh, MD In 1 week.   Specialty:  Family Medicine Why:  Office will call patient  Contact information: [address] [place] [address] [phone]        Eustace Moore, MD In 2 weeks.   Specialty:  Neurosurgery Why:  Office will xcall patient Contact information: [address] [place] [address] [phone]            Time coordinating discharge: 35 min  Signed:  Mercia Dowe Alison Stalling   [facility] 10/22/2015, 1:05 PM

## 2015-10-22 NOTE — Progress Notes (Signed)
Pt had 7 Beats of VT, stated he is asymptomatic, NAD noted, sitting up in chair talking w/ family, Dr. Eliseo Squires notified, no new orders given at this time.

## 2015-10-22 NOTE — Progress Notes (Signed)
Discharge paperwork given to wife, son and pt. Wife and patient understood instructions and all questions answered. Pt d/c by w/c by Janett Billow, NT.

## 2015-10-22 NOTE — Consult Note (Signed)
Reason for Consult:SDH Referring Physician: EDP  Pinchas AARIAN FLEWELLEN is an 82 y.o. male.   HPI:  This patient suffered a fall yesterday. He is on Coumadin for atrial fibrillation. INR was 2.1. Presented to the emergency department with abrasion to left forehead. He denies headache at this time. Denies numbness tingling or weakness. Denies visual changes. No nausea and vomiting.  Past Medical History:  Diagnosis Date  . Atrial fibrillation ([place]) 10/15/2012  . Embolic stroke ([place]) [id number]  . Essential hypertension 01/21/2014  . Long term current use of anticoagulant therapy 12/25/2012    History reviewed. No pertinent surgical history.  No Known Allergies  Social History  Substance Use Topics  . Smoking status: Former Research scientist (life sciences)  . Smokeless tobacco: Never Used  . Alcohol use No    Family History  Problem Relation Age of Onset  . Other Mother     Brain hemmorrhage  . Other Father     unknown     Review of Systems  Positive ROS: Negative  All other systems have been reviewed and were otherwise negative with the exception of those mentioned in the HPI and as above.  Objective: Vital signs in last 24 hours: Temp:  [98.1 F (36.7 C)-98.4 F (36.9 C)] 98.4 F (36.9 C) (10/12 0532) Pulse Rate:  [76-102] 77 (10/12 0532) Resp:  [18-23] 18 (10/12 0532) BP: (132-146)/(72-88) 134/78 (10/12 0532) SpO2:  [93 %-97 %] 97 % (10/12 0532) Weight:  [91.6 kg (202 lb)-92.5 kg (204 lb)] 91.6 kg (202 lb) (10/12 0121)  General Appearance: Alert, cooperative, no distress, appears stated age Head: Abrasion to left frontal region Eyes: PERRL     Throat: benign Neck: Supple Lungs: respirations unlabored Abdomen: Soft  NEUROLOGIC:   Mental status: A&O x4, no aphasia, good attention span, Memory and fund of knowledge appear to be appropriate Motor Exam - grossly normal, normal tone and bulk, no pronator drift Sensory Exam - grossly normal Coordination - grossly normal Gait -not tested Balance  - not tested Cranial Nerves: I: smell Not tested  II: visual acuity  OS: na    OD: na  II: visual fields Full to confrontation  II: pupils Equal, round, reactive to light  III,VII: ptosis None  III,IV,VI: extraocular muscles  Full ROM  V: mastication Normal  V: facial light touch sensation  Normal  V,VII: corneal reflex  Present  VII: facial muscle function - upper  Normal  VII: facial muscle function - lower Normal  VIII: hearing Not tested  IX: soft palate elevation  Normal  IX,X: gag reflex Present  XI: trapezius strength  5/5  XI: sternocleidomastoid strength 5/5  XI: neck flexion strength  5/5  XII: tongue strength  Normal    Data Review Lab Results  Component Value Date   WBC 7.4 10/22/2015   HGB 13.1 10/22/2015   HCT 38.9 (L) 10/22/2015   MCV 86.6 10/22/2015   PLT 88 (L) 10/22/2015   Lab Results  Component Value Date   NA 138 10/22/2015   K 4.3 10/22/2015   CL 103 10/22/2015   CO2 28 10/22/2015   BUN 11 10/22/2015   CREATININE 0.95 10/22/2015   GLUCOSE 99 10/22/2015   Lab Results  Component Value Date   INR 2.08 10/22/2015    Radiology: Ct Head Wo Contrast  Result Date: 10/21/2015 CLINICAL DATA:  Syncopal episode, [place] in [place]. Abrasions. EXAM: CT HEAD WITHOUT CONTRAST CT CERVICAL SPINE WITHOUT CONTRAST TECHNIQUE: Multidetector CT imaging of the head and  cervical spine was performed following the standard protocol without intravenous contrast. Multiplanar CT image reconstructions of the cervical spine were also generated. COMPARISON:  CT 05/01/2010 FINDINGS: CT HEAD FINDINGS Brain: Acute left frontal subdural hematoma measures 7 mm in thickness. No associated mass effect. There is no significant midline shift. No subarachnoid or intraventricular hemorrhage. Stable atrophy. Focal encephalomalacia left occipital lobe again seen. Cavum septum pellucidum again seen. Remote lacunar infarcts in the right caudate unchanged. Vascular: Atherosclerosis of  skullbase vasculature without hyperdense vessel. Skull: No calvarial fracture. Sinuses/Orbits: Remote right face fractures. Minimal mucosal thickening of the left maxillary sinus with unchanged mucous retention cyst. Mastoid air cells are clear. Other: Small left scalp hematoma. CT CERVICAL SPINE FINDINGS Alignment: Mild anterolisthesis of C3 on C4, mildly increased from prior. No acute traumatic subluxation. Skull base and vertebrae: No acute fracture. No primary bone lesion or focal pathologic process. Soft tissues and spinal canal: No prevertebral fluid or swelling. No visible canal hematoma. Disc levels: Diffuse disc space narrowing, advanced at C4-C5 through C6-C7. Associated endplate spurs. Multilevel facet arthropathy. Upper chest: Emphysema. Other: Carotid atherosclerosis. IMPRESSION: 1. Small acute left frontal subdural hematoma, 7 mm maximal thickness. No skull fracture. No midline shift. Stable atrophy and chronic ischemic change. 2. Multilevel degenerative change in the cervical spine, mildly progressed from 2012. No acute fracture or subluxation. Critical Value/emergent results were called by telephone at the time of interpretation on 10/21/2015 at 11:17 pm to Dr. Alfonzo Beers , who verbally acknowledged these results. Electronically Signed   By: Jeb Levering M.D.   On: 10/21/2015 23:18   Ct Cervical Spine Wo Contrast  Result Date: 10/21/2015 CLINICAL DATA:  Syncopal episode, fall in [place] parking lot. Abrasions. EXAM: CT HEAD WITHOUT CONTRAST CT CERVICAL SPINE WITHOUT CONTRAST TECHNIQUE: Multidetector CT imaging of the head and cervical spine was performed following the standard protocol without intravenous contrast. Multiplanar CT image reconstructions of the cervical spine were also generated. COMPARISON:  CT 05/01/2010 FINDINGS: CT HEAD FINDINGS Brain: Acute left frontal subdural hematoma measures 7 mm in thickness. No associated mass effect. There is no significant midline shift. No  subarachnoid or intraventricular hemorrhage. Stable atrophy. Focal encephalomalacia left occipital lobe again seen. Cavum septum pellucidum again seen. Remote lacunar infarcts in the right caudate unchanged. Vascular: Atherosclerosis of skullbase vasculature without hyperdense vessel. Skull: No calvarial fracture. Sinuses/Orbits: Remote right face fractures. Minimal mucosal thickening of the left maxillary sinus with unchanged mucous retention cyst. Mastoid air cells are clear. Other: Small left scalp hematoma. CT CERVICAL SPINE FINDINGS Alignment: Mild anterolisthesis of C3 on C4, mildly increased from prior. No acute traumatic subluxation. Skull base and vertebrae: No acute fracture. No primary bone lesion or focal pathologic process. Soft tissues and spinal canal: No prevertebral fluid or swelling. No visible canal hematoma. Disc levels: Diffuse disc space narrowing, advanced at C4-C5 through C6-C7. Associated endplate spurs. Multilevel facet arthropathy. Upper chest: Emphysema. Other: Carotid atherosclerosis. IMPRESSION: 1. Small acute left frontal subdural hematoma, 7 mm maximal thickness. No skull fracture. No midline shift. Stable atrophy and chronic ischemic change. 2. Multilevel degenerative change in the cervical spine, mildly progressed from 2012. No acute fracture or subluxation. Critical Value/emergent results were called by telephone at the time of interpretation on 10/21/2015 at 11:17 pm to Dr. Alfonzo Beers , who verbally acknowledged these results. Electronically Signed   By: Jeb Levering M.D.   On: 10/21/2015 23:18     Assessment/Plan: 82 year old gentleman on Coumadin for atrial fibrillation who suffered a left  subdural hematoma. Repeat CT scan of head today. Looks good clinically. Dioselina Brumbaugh S 10/22/2015 8:01 AM

## 2015-10-22 NOTE — Progress Notes (Signed)
Pt ambulating w/ PT w/ RW, HR is elevated up to 130's sustaining, no symptoms voiced, Dr. Eliseo Squires notified, no new orders given at this time, will continue to monitor.

## 2015-10-23 LAB — FOLATE RBC
FOLATE, RBC: 1423 ng/mL (ref >498)
Folate, Hemolysate: 528.1 ng/mL
HEMATOCRIT: 37.1 % — AB (ref 37.5–51.0)

## 2015-10-26 NOTE — Progress Notes (Signed)
Physical Therapy Addendum to Evaluation for G-codes    2015/10/25 1417  PT G-Codes **NOT FOR INPATIENT CLASS**  Functional Assessment Tool Used clinical judgement  Functional Limitation Mobility: Walking and moving around  Mobility: Walking and Moving Around Current Status [id number]) CI  Mobility: Walking and Moving Around Goal Status [phone]) CI  Mobility: Walking and Moving Around Discharge Status [phone]) CI    10/26/2015 Barry Brunner, PT Pager: [phone]

## 2015-11-10 ENCOUNTER — Ambulatory Visit (INDEPENDENT_AMBULATORY_CARE_PROVIDER_SITE_OTHER): Payer: Medicare Other | Admitting: *Deleted

## 2015-11-10 DIAGNOSIS — I6789 Other cerebrovascular disease: Secondary | ICD-10-CM

## 2015-11-10 DIAGNOSIS — I4891 Unspecified atrial fibrillation: Secondary | ICD-10-CM

## 2015-11-10 LAB — POCT INR: INR: 1.1

## 2015-11-13 ENCOUNTER — Ambulatory Visit
Admission: RE | Admit: 2015-11-13 | Discharge: 2015-11-13 | Disposition: A | Discharge status: Home / Self Care | Payer: Medicare Other | Source: Ambulatory Visit | Attending: Neurological Surgery | Admitting: Neurological Surgery

## 2015-11-13 ENCOUNTER — Other Ambulatory Visit: Payer: Self-pay | Admitting: Neurological Surgery

## 2015-11-13 DIAGNOSIS — S065X9A Traumatic subdural hemorrhage with loss of consciousness of unspecified duration, initial encounter: Secondary | ICD-10-CM

## 2015-11-13 DIAGNOSIS — S065XAA Traumatic subdural hemorrhage with loss of consciousness status unknown, initial encounter: Secondary | ICD-10-CM

## 2015-11-16 ENCOUNTER — Telehealth: Payer: Self-pay | Admitting: *Deleted

## 2015-11-16 ENCOUNTER — Other Ambulatory Visit: Payer: Self-pay | Admitting: Neurological Surgery

## 2015-11-16 DIAGNOSIS — S065XAA Traumatic subdural hemorrhage with loss of consciousness status unknown, initial encounter: Secondary | ICD-10-CM

## 2015-11-16 DIAGNOSIS — S065X9A Traumatic subdural hemorrhage with loss of consciousness of unspecified duration, initial encounter: Secondary | ICD-10-CM

## 2015-11-16 NOTE — Telephone Encounter (Signed)
Pt's wife called to inform us that Dr. Ronnald Ramp office just called them to inform them that he can restart his Coumadin today (pt had [place] & was hospitlalized & has been off Coumadin).  Advised wife to restart Coumadin at previous dose & confirmed dose with wife.  Made appt 1 week from starting.

## 2015-11-24 ENCOUNTER — Ambulatory Visit (INDEPENDENT_AMBULATORY_CARE_PROVIDER_SITE_OTHER): Payer: Medicare Other | Admitting: *Deleted

## 2015-11-24 DIAGNOSIS — I6789 Other cerebrovascular disease: Secondary | ICD-10-CM | POA: Diagnosis not present

## 2015-11-24 DIAGNOSIS — I4891 Unspecified atrial fibrillation: Secondary | ICD-10-CM | POA: Diagnosis not present

## 2015-11-24 LAB — POCT INR: INR: 1.7

## 2015-12-08 ENCOUNTER — Ambulatory Visit (INDEPENDENT_AMBULATORY_CARE_PROVIDER_SITE_OTHER): Payer: Medicare Other | Admitting: *Deleted

## 2015-12-08 DIAGNOSIS — I4891 Unspecified atrial fibrillation: Secondary | ICD-10-CM | POA: Diagnosis not present

## 2015-12-08 DIAGNOSIS — I6789 Other cerebrovascular disease: Secondary | ICD-10-CM

## 2015-12-08 LAB — POCT INR: INR: 2.1

## 2015-12-28 ENCOUNTER — Ambulatory Visit (INDEPENDENT_AMBULATORY_CARE_PROVIDER_SITE_OTHER): Payer: Medicare Other

## 2015-12-28 DIAGNOSIS — I4891 Unspecified atrial fibrillation: Secondary | ICD-10-CM

## 2015-12-28 DIAGNOSIS — I6789 Other cerebrovascular disease: Secondary | ICD-10-CM | POA: Diagnosis not present

## 2015-12-28 LAB — POCT INR: INR: 2.2

## 2015-12-29 ENCOUNTER — Other Ambulatory Visit: Payer: Self-pay | Admitting: Interventional Cardiology

## 2016-01-25 ENCOUNTER — Ambulatory Visit (INDEPENDENT_AMBULATORY_CARE_PROVIDER_SITE_OTHER): Payer: Medicare Other

## 2016-01-25 DIAGNOSIS — I6789 Other cerebrovascular disease: Secondary | ICD-10-CM | POA: Diagnosis not present

## 2016-01-25 DIAGNOSIS — I4891 Unspecified atrial fibrillation: Secondary | ICD-10-CM | POA: Diagnosis not present

## 2016-01-25 LAB — POCT INR: INR: 2.3

## 2016-02-02 ENCOUNTER — Other Ambulatory Visit: Payer: Self-pay | Admitting: Interventional Cardiology

## 2016-02-15 ENCOUNTER — Other Ambulatory Visit: Payer: Self-pay | Admitting: Interventional Cardiology

## 2016-03-04 ENCOUNTER — Ambulatory Visit: Payer: Medicare Other | Admitting: Interventional Cardiology

## 2016-03-07 ENCOUNTER — Ambulatory Visit (INDEPENDENT_AMBULATORY_CARE_PROVIDER_SITE_OTHER): Payer: Medicare Other | Admitting: *Deleted

## 2016-03-07 DIAGNOSIS — I4891 Unspecified atrial fibrillation: Secondary | ICD-10-CM | POA: Diagnosis not present

## 2016-03-07 DIAGNOSIS — I6789 Other cerebrovascular disease: Secondary | ICD-10-CM | POA: Diagnosis not present

## 2016-03-07 LAB — POCT INR: INR: 2.8

## 2016-03-29 ENCOUNTER — Other Ambulatory Visit: Payer: Self-pay | Admitting: Interventional Cardiology

## 2016-04-18 ENCOUNTER — Ambulatory Visit (INDEPENDENT_AMBULATORY_CARE_PROVIDER_SITE_OTHER): Payer: Medicare Other | Admitting: *Deleted

## 2016-04-18 DIAGNOSIS — I6789 Other cerebrovascular disease: Secondary | ICD-10-CM

## 2016-04-18 DIAGNOSIS — I4891 Unspecified atrial fibrillation: Secondary | ICD-10-CM | POA: Diagnosis not present

## 2016-04-18 LAB — POCT INR: INR: 2.3

## 2016-05-01 NOTE — Progress Notes (Signed)
Cardiology Office Note    Date:  05/02/2016   ID:  Donovan, Persley 1930-06-05, MRN [id number]  PCP:  Simona Huh, MD  Cardiologist: Sinclair Grooms, MD   Chief Complaint  Patient presents with  . Atrial Fibrillation    History of Present Illness:  [name] is a 83 y.o. male who presents for  Follow-up with chronic atrial fibrillation, long-term anticoagulation therapy, and history of embolic CVA. He also has essential hypertension.  Mr. Delfavero is doing well. He denies orthopnea, PND, chest pain, palpitations, and edema. No episodes of syncope. He did have a fall in the parking lot of his church related to a misstep. He had subdural hematoma and had to stay off of Coumadin for 2 weeks but has resumed therapy.  Past Medical History:  Diagnosis Date  . Atrial fibrillation ([place]) 10/15/2012  . Embolic stroke ([place]) 94/49/6759  . Essential hypertension 01/21/2014  . Long term current use of anticoagulant therapy 12/25/2012    No past surgical history on file.  Current Medications: Outpatient Medications Prior to Visit  Medication Sig Dispense Refill  . calcium carbonate (OS-CAL) 600 MG TABS tablet Take 600 mg by mouth daily.     Marland Kitchen doxycycline (VIBRAMYCIN) 100 MG capsule Take 1 capsule (100 mg total) by mouth 2 (two) times daily. 20 capsule 0  . finasteride (PROSCAR) 5 MG tablet Take 5 mg by mouth daily.    . Glucosamine HCl (GLUCOSAMINE PO) Take 1,500 mg by mouth daily.     . hydrochlorothiazide (HYDRODIURIL) 12.5 MG tablet TAKE ONE TABLET BY MOUTH ONCE DAILY 90 tablet 0  . lisinopril (PRINIVIL,ZESTRIL) 10 MG tablet Take 10 mg by mouth daily.    . metoprolol succinate (TOPROL-XL) 50 MG 24 hr tablet TAKE ONE TABLET BY MOUTH ONCE DAILY 90 tablet 0  . Multiple Vitamin (MULTIVITAMIN) tablet Take 1 tablet by mouth daily.    . Multiple Vitamins-Minerals (PRESERVISION AREDS 2) CAPS Take 1 capsule by mouth 2 (two) times daily.    . ondansetron (ZOFRAN) 4 MG tablet Take 1  tablet (4 mg total) by mouth every 6 (six) hours as needed for nausea. 20 tablet 0  . tamsulosin (FLOMAX) 0.4 MG CAPS capsule Take 0.4 mg by mouth daily.     Marland Kitchen warfarin (COUMADIN) 5 MG tablet TAKE AS DIRECTED BY  ANTICOAGULATION  CLINIC 40 tablet 3   No facility-administered medications prior to visit.      Allergies:   Patient has no known allergies.   Social History   Social History  . Marital status: Married    Spouse name: N/A  . Number of children: N/A  . Years of education: N/A   Social History Main Topics  . Smoking status: Former Research scientist (life sciences)  . Smokeless tobacco: Never Used  . Alcohol use No  . Drug use: No  . Sexual activity: Not Asked   Other Topics Concern  . None   Social History Narrative  . None     Family History:  The patient's family history includes Other in his father and mother.   ROS:   Please see the history of present illness.    Drank since his leg on the right side somewhat related to residual from prior stroke and hip surgery.  All other systems reviewed and are negative.   PHYSICAL EXAM:   VS:  BP 134/72 (BP Location: Left Arm)   Pulse (!) 58   Ht 6' (1.829 m)   Wt 216  lb 9.6 oz (98.2 kg)   BMI 29.38 kg/m    GEN: Well nourished, well developed, in no acute distress  HEENT: normal  Neck: no JVD, carotid bruits, or masses Cardiac: IIRR; no murmurs, rubs, or gallops,no edema  Respiratory:  clear to auscultation bilaterally, normal work of breathing GI: soft, nontender, nondistended, + BS MS: no deformity or atrophy  Skin: warm and dry, no rash Neuro:  Alert and Oriented x 3, Strength and sensation are intact Psych: euthymic mood, full affect  Wt Readings from Last 3 Encounters:  05/02/16 216 lb 9.6 oz (98.2 kg)  10/22/15 202 lb (91.6 kg)  10/19/15 204 lb 3.2 oz (92.6 kg)      Studies/Labs Reviewed:   EKG:  EKG  none  Recent Labs: 10/22/2015: ALT 35; BUN 11; Creatinine, Ser 0.95; Hemoglobin 13.1; Platelets 88; Potassium 4.3;  Sodium 138   Lipid Panel No results found for: CHOL, TRIG, HDL, CHOLHDL, VLDL, LDLCALC, LDLDIRECT  Additional studies/ records that were reviewed today include:  None    ASSESSMENT:    1. Chronic atrial fibrillation ([place])   2. Essential hypertension   3. Long term current use of anticoagulant therapy   4. 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 Stable on current metoprolol dose. No change in therapy needed. 2. Excellent blood pressure control. 2 g sodium diet. 3. Coumadin therapy followed as OP by family physician. 4. No new neurological complaints.  One-year clinical follow-up. No change in therapy.    Medication Adjustments/Labs and Tests Ordered: Current medicines are reviewed at length with the patient today.  Concerns regarding medicines are outlined above.  Medication changes, Labs and Tests ordered today are listed in the Patient Instructions below. There are no Patient Instructions on file for this visit.   Signed, Sinclair Grooms, MD  05/02/2016 10:10 AM    [facility] [address], [place], [place]  [address] Phone: [phone]; Fax: [phone]

## 2016-05-02 ENCOUNTER — Encounter: Payer: Self-pay | Admitting: Interventional Cardiology

## 2016-05-02 ENCOUNTER — Ambulatory Visit (INDEPENDENT_AMBULATORY_CARE_PROVIDER_SITE_OTHER): Payer: Medicare Other | Admitting: Interventional Cardiology

## 2016-05-02 VITALS — BP 134/72 | HR 58 | Ht 72.0 in | Wt 216.6 lb

## 2016-05-02 DIAGNOSIS — I482 Chronic atrial fibrillation, unspecified: Secondary | ICD-10-CM

## 2016-05-02 DIAGNOSIS — I10 Essential (primary) hypertension: Secondary | ICD-10-CM | POA: Diagnosis not present

## 2016-05-02 DIAGNOSIS — I63133 Cerebral infarction due to embolism of bilateral carotid arteries: Secondary | ICD-10-CM | POA: Diagnosis not present

## 2016-05-02 DIAGNOSIS — Z7901 Long term (current) use of anticoagulants: Secondary | ICD-10-CM | POA: Diagnosis not present

## 2016-05-02 NOTE — Patient Instructions (Signed)

## 2016-05-03 ENCOUNTER — Other Ambulatory Visit: Payer: Self-pay | Admitting: Interventional Cardiology

## 2016-05-30 ENCOUNTER — Ambulatory Visit (INDEPENDENT_AMBULATORY_CARE_PROVIDER_SITE_OTHER): Payer: Medicare Other

## 2016-05-30 DIAGNOSIS — I4891 Unspecified atrial fibrillation: Secondary | ICD-10-CM | POA: Diagnosis not present

## 2016-05-30 DIAGNOSIS — I6789 Other cerebrovascular disease: Secondary | ICD-10-CM | POA: Diagnosis not present

## 2016-05-30 LAB — POCT INR: INR: 2.2

## 2016-06-18 ENCOUNTER — Other Ambulatory Visit: Payer: Self-pay | Admitting: Interventional Cardiology

## 2016-06-20 ENCOUNTER — Other Ambulatory Visit: Payer: Self-pay | Admitting: *Deleted

## 2016-07-11 ENCOUNTER — Ambulatory Visit (INDEPENDENT_AMBULATORY_CARE_PROVIDER_SITE_OTHER): Payer: Medicare Other | Admitting: *Deleted

## 2016-07-11 DIAGNOSIS — I4891 Unspecified atrial fibrillation: Secondary | ICD-10-CM | POA: Diagnosis not present

## 2016-07-11 DIAGNOSIS — I6789 Other cerebrovascular disease: Secondary | ICD-10-CM | POA: Diagnosis not present

## 2016-07-11 LAB — POCT INR: INR: 2.4

## 2016-07-12 ENCOUNTER — Other Ambulatory Visit (HOSPITAL_COMMUNITY): Payer: Self-pay | Admitting: Family Medicine

## 2016-07-12 DIAGNOSIS — R1011 Right upper quadrant pain: Secondary | ICD-10-CM

## 2016-07-14 ENCOUNTER — Ambulatory Visit (HOSPITAL_COMMUNITY)
Admission: RE | Admit: 2016-07-14 | Discharge: 2016-07-14 | Disposition: A | Discharge status: Home / Self Care | Payer: Medicare Other | Source: Ambulatory Visit | Attending: Family Medicine | Admitting: Family Medicine

## 2016-07-14 DIAGNOSIS — R1011 Right upper quadrant pain: Secondary | ICD-10-CM | POA: Insufficient documentation

## 2016-08-22 ENCOUNTER — Ambulatory Visit (INDEPENDENT_AMBULATORY_CARE_PROVIDER_SITE_OTHER): Payer: Medicare Other

## 2016-08-22 DIAGNOSIS — I6789 Other cerebrovascular disease: Secondary | ICD-10-CM | POA: Diagnosis not present

## 2016-08-22 DIAGNOSIS — I4891 Unspecified atrial fibrillation: Secondary | ICD-10-CM | POA: Diagnosis not present

## 2016-08-22 LAB — POCT INR: INR: 3.1

## 2016-09-27 ENCOUNTER — Ambulatory Visit (INDEPENDENT_AMBULATORY_CARE_PROVIDER_SITE_OTHER): Payer: Medicare Other

## 2016-09-27 DIAGNOSIS — I6789 Other cerebrovascular disease: Secondary | ICD-10-CM | POA: Diagnosis not present

## 2016-09-27 DIAGNOSIS — Z5181 Encounter for therapeutic drug level monitoring: Secondary | ICD-10-CM | POA: Diagnosis not present

## 2016-09-27 DIAGNOSIS — I4891 Unspecified atrial fibrillation: Secondary | ICD-10-CM | POA: Diagnosis not present

## 2016-09-27 LAB — POCT INR: INR: 3.4

## 2016-10-18 ENCOUNTER — Ambulatory Visit (INDEPENDENT_AMBULATORY_CARE_PROVIDER_SITE_OTHER): Payer: Medicare Other | Admitting: *Deleted

## 2016-10-18 DIAGNOSIS — I6789 Other cerebrovascular disease: Secondary | ICD-10-CM | POA: Diagnosis not present

## 2016-10-18 DIAGNOSIS — I4891 Unspecified atrial fibrillation: Secondary | ICD-10-CM | POA: Diagnosis not present

## 2016-10-18 DIAGNOSIS — Z5181 Encounter for therapeutic drug level monitoring: Secondary | ICD-10-CM

## 2016-10-18 LAB — POCT INR: INR: 3.3

## 2016-11-09 ENCOUNTER — Ambulatory Visit (INDEPENDENT_AMBULATORY_CARE_PROVIDER_SITE_OTHER): Payer: Medicare Other | Admitting: *Deleted

## 2016-11-09 DIAGNOSIS — I6789 Other cerebrovascular disease: Secondary | ICD-10-CM

## 2016-11-09 DIAGNOSIS — I4891 Unspecified atrial fibrillation: Secondary | ICD-10-CM | POA: Diagnosis not present

## 2016-11-09 LAB — POCT INR: INR: 2.5

## 2016-11-30 ENCOUNTER — Ambulatory Visit (INDEPENDENT_AMBULATORY_CARE_PROVIDER_SITE_OTHER): Payer: Medicare Other | Admitting: *Deleted

## 2016-11-30 DIAGNOSIS — I4891 Unspecified atrial fibrillation: Secondary | ICD-10-CM

## 2016-11-30 DIAGNOSIS — Z5181 Encounter for therapeutic drug level monitoring: Secondary | ICD-10-CM

## 2016-11-30 DIAGNOSIS — I6789 Other cerebrovascular disease: Secondary | ICD-10-CM | POA: Diagnosis not present

## 2016-11-30 NOTE — Patient Instructions (Signed)
Continue  taking 1 tablet daily except 1.5 tablets on Wednesdays.  Recheck in 4 weeks.

## 2016-12-22 ENCOUNTER — Encounter: Payer: Medicare Other | Attending: Surgery | Admitting: Surgery

## 2016-12-22 DIAGNOSIS — L97221 Non-pressure chronic ulcer of left calf limited to breakdown of skin: Secondary | ICD-10-CM | POA: Insufficient documentation

## 2016-12-22 DIAGNOSIS — Z87891 Personal history of nicotine dependence: Secondary | ICD-10-CM | POA: Insufficient documentation

## 2016-12-22 DIAGNOSIS — S81812A Laceration without foreign body, left lower leg, initial encounter: Secondary | ICD-10-CM | POA: Insufficient documentation

## 2016-12-22 DIAGNOSIS — I10 Essential (primary) hypertension: Secondary | ICD-10-CM | POA: Diagnosis not present

## 2016-12-22 DIAGNOSIS — Z7982 Long term (current) use of aspirin: Secondary | ICD-10-CM | POA: Diagnosis not present

## 2016-12-22 DIAGNOSIS — Z79899 Other long term (current) drug therapy: Secondary | ICD-10-CM | POA: Diagnosis not present

## 2016-12-22 DIAGNOSIS — I480 Paroxysmal atrial fibrillation: Secondary | ICD-10-CM | POA: Insufficient documentation

## 2016-12-22 DIAGNOSIS — Z7901 Long term (current) use of anticoagulants: Secondary | ICD-10-CM | POA: Diagnosis not present

## 2016-12-22 DIAGNOSIS — X58XXXA Exposure to other specified factors, initial encounter: Secondary | ICD-10-CM | POA: Diagnosis not present

## 2016-12-22 DIAGNOSIS — L97222 Non-pressure chronic ulcer of left calf with fat layer exposed: Secondary | ICD-10-CM | POA: Insufficient documentation

## 2016-12-22 DIAGNOSIS — I890 Lymphedema, not elsewhere classified: Secondary | ICD-10-CM | POA: Insufficient documentation

## 2016-12-24 NOTE — Progress Notes (Addendum)
[name] ([id number]) Visit Report for 12/22/2016 Chief Complaint Document Details Patient Name: [name]. Date of Service: 12/22/2016 12:30 PM Medical Record Number: [id number] Patient Account Number: [id number] Date of Birth/Sex: [birth year] (83 y.o. Male) Treating RN: Roger Shelter Primary Care Provider: Gaynelle Arabian Other Clinician: Referring Provider: Gaynelle Arabian Treating Provider/Extender: Frann Rider in Treatment: 0 Information Obtained from: Patient Chief Complaint Patient seen for complaints of Non-Healing Wound to the left posterior calf and also the left anterior shin which has been there for about 5 weeks. He also has bilateral lower extremity swelling for many years Electronic Signature(s) Signed: 12/22/2016 2:09:43 PM By: Christin Fudge MD, FACS Entered By: Christin Fudge on 12/22/2016 14:09:42 [name] ([id number]) -------------------------------------------------------------------------------- Debridement Details Patient Name: [name] Date of Service: 12/22/2016 12:30 PM Medical Record Number: [id number] Patient Account Number: [id number] Date of Birth/Sex: 01/31/1930 (83 y.o. Male) Treating RN: Roger Shelter Primary Care Provider: Gaynelle Arabian Other Clinician: Referring Provider: Gaynelle Arabian Treating Provider/Extender: Frann Rider in Treatment: 0 Debridement Performed for Wound #1 Left,Midline,Posterior Lower Leg Assessment: Performed By: Physician Christin Fudge, MD Debridement: Debridement Severity of Tissue Pre Fat layer exposed Debridement: Pre-procedure Verification/Time Yes - 01:35 Out Taken: Start Time: 01:35 Pain Control: Other : lidocaine 4% Level: Skin/Subcutaneous Tissue Total Area Debrided (L x W): 1 (cm) x 1.5 (cm) = 1.5 (cm) Tissue and other material Viable, Eschar, Fibrin/Slough, Skin, Subcutaneous debrided: Instrument: Curette Bleeding: Minimum Hemostasis Achieved:  Pressure End Time: 01:36 Procedural Pain: 0 Post Procedural Pain: 0 Response to Treatment: Procedure was tolerated well Post Debridement Measurements of Total Wound Length: (cm) 1 Width: (cm) 1.5 Depth: (cm) 0.1 Volume: (cm) 0.118 Character of Wound/Ulcer Post Debridement: Stable Severity of Tissue Post Debridement: Fat layer exposed Post Procedure Diagnosis Same as Pre-procedure Electronic Signature(s) Signed: 12/22/2016 2:09:07 PM By: Christin Fudge MD, FACS Signed: 12/22/2016 5:14:07 PM By: Roger Shelter Entered By: Christin Fudge on 12/22/2016 14:09:07 [name] ([id number]) -------------------------------------------------------------------------------- HPI Details Patient Name: [name] Date of Service: 12/22/2016 12:30 PM Medical Record Number: [id number] Patient Account Number: [id number] Date of Birth/Sex: 03-03-1930 (83 y.o. Male) Treating RN: Roger Shelter Primary Care Provider: Gaynelle Arabian Other Clinician: Referring Provider: Gaynelle Arabian Treating Provider/Extender: Frann Rider in Treatment: 0 History of Present Illness Location: left lower extremity swelling with ulceration on the posterior calf and anterior shin Quality: Patient reports experiencing an aching pain to affected area(s). Severity: Patient states wound are getting worse. Duration: Patient has had the wound for < 6 weeks prior to presenting for treatment Timing: Pain in wound is Intermittent (comes and goes Context: The wound appeared gradually over time Modifying Factors: Other treatment(s) tried include:and seen his PCP who thought there was cellulitis and infection and put him on doxycycline for 2 weeks Associated Signs and Symptoms: Patient reports having increase swelling. HPI Description: 83 year old gentleman has been referred to [place] by his PCP Dr. Gaynelle Arabian, for a ulcer on the left posterior lower leg which has been there for about 6 weeks. The patient was  thought to have an infection and was put on doxycycline for 14 days and referred to the wound center. He has a past medical history of paroxysmal atrial fibrillation, hypertension, diverticulosis, status post right hip replacement, appendectomy. He has not been a smoker since 72. the anterior shin wound has been more recent about 2 weeks and this was caused probably due to a blunt abrasion. Electronic Signature(s) Signed: 12/22/2016 2:11:48 PM By: Christin Fudge MD,  FACS Previous Signature: 12/22/2016 1:50:39 PM Version By: Christin Fudge MD, FACS Previous Signature: 12/22/2016 12:56:43 PM Version By: Christin Fudge MD, FACS Entered By: Christin Fudge on 12/22/2016 14:11:47 [name] ([id number]) -------------------------------------------------------------------------------- Physical Exam Details Patient Name: [name] Date of Service: 12/22/2016 12:30 PM Medical Record Number: [id number] Patient Account Number: [id number] Date of Birth/Sex: 10/26/1930 (83 y.o. Male) Treating RN: Roger Shelter Primary Care Provider: Gaynelle Arabian Other Clinician: Referring Provider: Gaynelle Arabian Treating Provider/Extender: Frann Rider in Treatment: 0 Constitutional . Pulse regular. Respirations normal and unlabored. Afebrile. . Eyes Nonicteric. Reactive to light. Ears, Nose, Mouth, and Throat Lips, teeth, and gums WNL.Marland Kitchen Moist mucosa without lesions. Neck supple and nontender. No palpable supraclavicular or cervical adenopathy. Normal sized without goiter. Respiratory WNL. No retractions.. Cardiovascular Pedal Pulses WNL. ABI on the left was 1.15. he has stage II lymphedema bilaterally. Gastrointestinal (GI) Abdomen without masses or tenderness.. No liver or spleen enlargement or tenderness.. Lymphatic No adneopathy. No adenopathy. No adenopathy. Musculoskeletal Adexa without tenderness or enlargement.. Digits and nails w/o clubbing, cyanosis, infection, petechiae,  ischemia, or inflammatory conditions.. Integumentary (Hair, Skin) No suspicious lesions. No crepitus or fluctuance. No peri-wound warmth or erythema. No masses.Marland Kitchen Psychiatric Judgement and insight Intact.. No evidence of depression, anxiety, or agitation.. Notes the patient has bilateral stage II lymphedema and the left posterior calf he has got a necrotic wound which need sharp debridement with a #3 curet and minimal bleeding controlled with pressure the anterior shin wound is more superficial and is just breakdown of skin and this was washed out with moist saline gauze Electronic Signature(s) Signed: 12/22/2016 2:12:51 PM By: Christin Fudge MD, FACS Entered By: Christin Fudge on 12/22/2016 14:12:50 [name] ([id number]) -------------------------------------------------------------------------------- Physician Orders Details Patient Name: [name] Date of Service: 12/22/2016 12:30 PM Medical Record Number: [id number] Patient Account Number: [id number] Date of Birth/Sex: [birth year] (83 y.o. Male) Treating RN: Roger Shelter Primary Care Provider: Gaynelle Arabian Other Clinician: Referring Provider: Gaynelle Arabian Treating Provider/Extender: Frann Rider in Treatment: 0 Verbal / Phone Orders: No Diagnosis Coding ICD-10 Coding Code Description I89.0 Lymphedema, not elsewhere classified S81.812A Laceration without foreign body, left lower leg, initial encounter L97.222 Non-pressure chronic ulcer of left calf with fat layer exposed L97.221 Non-pressure chronic ulcer of left calf limited to breakdown of skin Wound Cleansing Wound #1 Left,Midline,Posterior Lower Leg o Clean wound with Normal Saline. Wound #2 Left,Medial,Anterior Lower Leg o Clean wound with Normal Saline. Anesthetic (add to Medication List) Wound #1 Left,Midline,Posterior Lower Leg o Topical Lidocaine 4% cream applied to wound bed prior to debridement (In Clinic Only). Wound #2  Left,Medial,Anterior Lower Leg o Topical Lidocaine 4% cream applied to wound bed prior to debridement (In Clinic Only). Primary Wound Dressing Wound #1 Left,Midline,Posterior Lower Leg o Santyl Ointment Wound #2 Left,Medial,Anterior Lower Leg o Prisma Ag - apply saline Secondary Dressing Wound #1 Left,Midline,Posterior Lower Leg o Boardered Foam Dressing Wound #2 Left,Medial,Anterior Lower Leg o Boardered Foam Dressing Dressing Change Frequency Wound #1 Left,Midline,Posterior Lower Leg o Change dressing every other day. Wound #2 Left,Medial,Anterior Lower Leg o Change dressing every other day. [name], [name] ([id number]) Follow-up Appointments o Return Appointment in 1 week. Edema Control Wound #1 Left,Midline,Posterior Lower Leg o Patient to wear own compression stockings Wound #2 Left,Medial,Anterior Lower Leg o Patient to wear own compression stockings Patient Medications Allergies: No Known Drug Allergies Notifications Medication Indication Start End Santyl 12/22/2016 DOSE topical 250 unit/gram ointment - ointment topical as directed Electronic Signature(s) Signed:  12/22/2016 4:37:34 PM By: Roger Shelter Signed: 12/23/2016 12:55:29 PM By: Christin Fudge MD, FACS Previous Signature: 12/22/2016 2:13:49 PM Version By: Christin Fudge MD, FACS Entered By: Roger Shelter on 12/22/2016 16:37:33 [name] ([id number]) -------------------------------------------------------------------------------- Problem List Details Patient Name: RODNEY, YERA. Date of Service: 12/22/2016 12:30 PM Medical Record Number: [id number] Patient Account Number: [id number] Date of Birth/Sex: 02-23-1930 (83 y.o. Male) Treating RN: Roger Shelter Primary Care Provider: Gaynelle Arabian Other Clinician: Referring Provider: Gaynelle Arabian Treating Provider/Extender: Frann Rider in Treatment: 0 Active Problems ICD-10 Encounter Code Description Active  Date Diagnosis I89.0 Lymphedema, not elsewhere classified 12/22/2016 Yes S81.812A Laceration without foreign body, left lower leg, initial encounter 12/22/2016 Yes L97.222 Non-pressure chronic ulcer of left calf with fat layer exposed 12/22/2016 Yes L97.221 Non-pressure chronic ulcer of left calf limited to breakdown of skin 12/22/2016 Yes Inactive Problems Resolved Problems Electronic Signature(s) Signed: 12/22/2016 2:08:50 PM By: Christin Fudge MD, FACS Entered By: Christin Fudge on 12/22/2016 14:08:50 [name] ([id number]) -------------------------------------------------------------------------------- Progress Note Details Patient Name: [name] Date of Service: 12/22/2016 12:30 PM Medical Record Number: [id number] Patient Account Number: [id number] Date of Birth/Sex: [birth year] (83 y.o. Male) Treating RN: Roger Shelter Primary Care Provider: Gaynelle Arabian Other Clinician: Referring Provider: Gaynelle Arabian Treating Provider/Extender: Frann Rider in Treatment: 0 Subjective Chief Complaint Information obtained from Patient Patient seen for complaints of Non-Healing Wound to the left posterior calf and also the left anterior shin which has been there for about 5 weeks. He also has bilateral lower extremity swelling for many years History of Present Illness (HPI) The following HPI elements were documented for the patient's wound: Location: left lower extremity swelling with ulceration on the posterior calf and anterior shin Quality: Patient reports experiencing an aching pain to affected area(s). Severity: Patient states wound are getting worse. Duration: Patient has had the wound for < 6 weeks prior to presenting for treatment Timing: Pain in wound is Intermittent (comes and goes Context: The wound appeared gradually over time Modifying Factors: Other treatment(s) tried include:and seen his PCP who thought there was cellulitis and infection and put him on  doxycycline for 2 weeks Associated Signs and Symptoms: Patient reports having increase swelling. 83 year old gentleman has been referred to [place] by his PCP Dr. Gaynelle Arabian, for a ulcer on the left posterior lower leg which has been there for about 6 weeks. The patient was thought to have an infection and was put on doxycycline for 14 days and referred to the wound center. He has a past medical history of paroxysmal atrial fibrillation, hypertension, diverticulosis, status post right hip replacement, appendectomy. He has not been a smoker since 89. the anterior shin wound has been more recent about 2 weeks and this was caused probably due to a blunt abrasion. Wound History Patient presents with 2 open wounds that have been present for approximately 4 weeks. Patient has been treating wounds in the following manner: neosporin. Laboratory tests have not been performed in the last month. Patient reportedly has not tested positive for an antibiotic resistant organism. Patient reportedly has not tested positive for osteomyelitis. Patient reportedly has not had testing performed to evaluate circulation in the legs. Patient History Information obtained from Patient. Allergies No Known Drug Allergies Family History Diabetes - Child, Hypertension - Child, No family history of Cancer, Heart Disease, Kidney Disease, Lung Disease, Seizures, Stroke, Thyroid Problems, Tuberculosis. Social History Former smoker - 24 years +, Marital Status - Married, Alcohol Use - Never, Drug Use -  No History, Caffeine Use - Rarely. Medical History [name], [name] ([id number]) Eyes Denies history of Cataracts, Glaucoma, Optic Neuritis Ear/Nose/Mouth/Throat Denies history of Chronic sinus problems/congestion, Middle ear problems Hematologic/Lymphatic Denies history of Anemia, Hemophilia, Human Immunodeficiency Virus, Lymphedema, Sickle Cell Disease Respiratory Denies history of Aspiration, Asthma, Chronic Obstructive  Pulmonary Disease (COPD), Pneumothorax, Sleep Apnea, Tuberculosis Cardiovascular Patient has history of Arrhythmia - A- Fib Denies history of Angina, Congestive Heart Failure, Coronary Artery Disease, Deep Vein Thrombosis, Hypertension, Hypotension, Myocardial Infarction, Peripheral Arterial Disease, Peripheral Venous Disease, Phlebitis, Vasculitis Gastrointestinal Denies history of Cirrhosis , Colitis, Crohn s, Hepatitis A, Hepatitis B, Hepatitis C Endocrine Denies history of Type I Diabetes, Type II Diabetes Genitourinary Denies history of End Stage Renal Disease Immunological Denies history of Lupus Erythematosus, Raynaud s Integumentary (Skin) Denies history of History of Burn, History of pressure wounds Musculoskeletal Denies history of Gout, Rheumatoid Arthritis, Osteoarthritis, Osteomyelitis Neurologic Denies history of Dementia, Neuropathy, Quadriplegia, Paraplegia, Seizure Disorder Psychiatric Denies history of Anorexia/bulimia, Confinement Anxiety Review of Systems (ROS) Constitutional Symptoms (General Health) Denies complaints or symptoms of Fatigue, Fever, Chills, Marked Weight Change. Eyes Complains or has symptoms of Vision Changes - glasses. Denies complaints or symptoms of Dry Eyes. Ear/Nose/Mouth/Throat Denies complaints or symptoms of Difficult clearing ears, Sinusitis. Hematologic/Lymphatic Complains or has symptoms of Bleeding / Clotting Disorders. Denies complaints or symptoms of Human Immunodeficiency Virus. Respiratory Denies complaints or symptoms of Chronic or frequent coughs, Shortness of Breath. Cardiovascular Complains or has symptoms of LE edema. Denies complaints or symptoms of Chest pain. Gastrointestinal Denies complaints or symptoms of Frequent diarrhea, Nausea, Vomiting. Endocrine Denies complaints or symptoms of Hepatitis, Thyroid disease, Polydypsia (Excessive Thirst). Genitourinary Denies complaints or symptoms of Kidney failure/  Dialysis, Incontinence/dribbling. Immunological Denies complaints or symptoms of Hives, Itching. Integumentary (Skin) Complains or has symptoms of Wounds, Bleeding or bruising tendency, Breakdown, Swelling. Musculoskeletal Denies complaints or symptoms of Muscle Pain, Muscle Weakness. Neurologic Complains or has symptoms of Numbness/parasthesias. [name], [name] ([id number]) Denies complaints or symptoms of Focal/Weakness. Oncologic The patient has no complaints or symptoms. Psychiatric Denies complaints or symptoms of Anxiety, Claustrophobia. Medications warfarin 5 mg tablet oral 1 1 tablet oral daily except for Wednesday and Saturday - take one and one half tablets lisinopril 10 mg tablet oral 1 1 tablet oral daily glucosamine HCl 1,500 mg tablet oral 1 1 tablet oral daily finasteride 5 mg tablet oral 1 1 tablet oral daily tamsulosin 0.4 mg capsule oral 2 2 capsule,extended release 24hr oral daily metoprolol succinate ER 50 mg tablet,extended release 24 hr oral 1 1 tablet extended release 24 hr oral daily multivitamin tablet oral 1 1 tablet oral daily aspirin 81 mg tablet,delayed release oral 1 1 tablet,delayed release (DR/EC) oral daily doxycycline hyclate 100 mg tablet oral 1 1 tablet oral two times daily for 14 days hydrochlorothiazide 12.5 mg capsule oral 1 1 capsule oral daily triamcinolone acetonide 0.1 % topical cream topical cream topical apply to affected area one or two times daily Santyl 250 unit/gram topical ointment topical ointment topical as directed Objective Constitutional Pulse regular. Respirations normal and unlabored. Afebrile. Vitals Time Taken: 12:52 PM, Height: 72 in, Source: Stated, Weight: 217 lbs, Source: Measured, BMI: 29.4, Temperature: 98.0 F, Pulse: 80 bpm, Respiratory Rate: 18 breaths/min, Blood Pressure: 143/65 mmHg. Eyes Nonicteric. Reactive to light. Ears, Nose, Mouth, and Throat Lips, teeth, and gums WNL.Marland Kitchen Moist mucosa without  lesions. Neck supple and nontender. No palpable supraclavicular or cervical adenopathy. Normal sized without goiter. Respiratory WNL. No retractions.Marland Kitchen  Cardiovascular Pedal Pulses WNL. ABI on the left was 1.15. he has stage II lymphedema bilaterally. Gastrointestinal (GI) Abdomen without masses or tenderness.. No liver or spleen enlargement or tenderness.. Lymphatic No adneopathy. No adenopathy. No adenopathy. [name], [name] ([id number]) Musculoskeletal Adexa without tenderness or enlargement.. Digits and nails w/o clubbing, cyanosis, infection, petechiae, ischemia, or inflammatory conditions.Marland Kitchen Psychiatric Judgement and insight Intact.. No evidence of depression, anxiety, or agitation.. General Notes: the patient has bilateral stage II lymphedema and the left posterior calf he has got a necrotic wound which need sharp debridement with a #3 curet and minimal bleeding controlled with pressure the anterior shin wound is more superficial and is just breakdown of skin and this was washed out with moist saline gauze Integumentary (Hair, Skin) No suspicious lesions. No crepitus or fluctuance. No peri-wound warmth or erythema. No masses.. Wound #1 status is Open. Original cause of wound was Gradually Appeared. The wound is located on the Left,Midline,Posterior Lower Leg. The wound measures 1cm length x 1.5cm width x 0.1cm depth; 1.178cm^2 area and 0.118cm^3 volume. There is Fat Layer (Subcutaneous Tissue) Exposed exposed. There is no tunneling or undermining noted. There is a medium amount of serous drainage noted. The wound margin is flat and intact. There is no granulation within the wound bed. There is a large (67-100%) amount of necrotic tissue within the wound bed including Eschar. The periwound skin appearance exhibited: Induration, Erythema. The periwound skin appearance did not exhibit: Callus, Crepitus, Excoriation, Rash, Scarring, Dry/Scaly, Maceration, Atrophie Blanche, Cyanosis,  Ecchymosis, Hemosiderin Staining, Mottled, Pallor, Rubor. The surrounding wound skin color is noted with erythema which is circumferential. Periwound temperature was noted as No Abnormality. The periwound has tenderness on palpation. Wound #2 status is Open. Original cause of wound was Gradually Appeared. The wound is located on the Left,Medial,Anterior Lower Leg. The wound measures 1.5cm length x 2.2cm width x 0.1cm depth; 2.592cm^2 area and 0.259cm^3 volume. There is no tunneling or undermining noted. There is a small amount of serosanguineous drainage noted. The wound margin is flat and intact. There is no granulation within the wound bed. There is a large (67-100%) amount of necrotic tissue within the wound bed including Eschar. The periwound skin appearance exhibited: Excoriation, Dry/Scaly, Erythema. The periwound skin appearance did not exhibit: Callus, Crepitus, Induration, Rash, Scarring, Maceration, Atrophie Blanche, Cyanosis, Ecchymosis, Hemosiderin Staining, Mottled, Pallor, Rubor. The surrounding wound skin color is noted with erythema which is circumferential. Assessment Active Problems ICD-10 I89.0 - Lymphedema, not elsewhere classified S81.812A - Laceration without foreign body, left lower leg, initial encounter I09.735 - Non-pressure chronic ulcer of left calf with fat layer exposed L97.221 - Non-pressure chronic ulcer of left calf limited to breakdown of skin 83 year old gentleman with bilateral lower extremity edema had probably a laceration to his left lower extremity, which has been very slow to heal over time. After review have recommended: 1. Santyl ointment locally to be applied daily to the posterior calf wound and covered with a bordered foam 2. Prisma AG to the anterior shin wound covered with a bordered foam to be changed daily 3. Elevation and exercise discussed with him in great detail [name], [name]. ([id number]) 4. 20-30 mm compression stockings to be applied  daily except for bedtime 5. Adequate protein, vitamin A, vitamin C and zinc 6. 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 There was a  Skin/Subcutaneous Tissue Debridement ([id number]) debridement with total area of 1.5 sq cm performed by Christin Fudge, MD. with the following instrument(s): Curette to remove Viable tissue/material including Fibrin/Slough, Eschar, Skin, and Subcutaneous after achieving pain control using Other (lidocaine 4%). A time out was conducted at 01:35, prior to the start of the procedure. A Minimum amount of bleeding was controlled with Pressure. The procedure was tolerated well with a pain level of 0 throughout and a pain level of 0 following the procedure. Post Debridement Measurements: 1cm length x 1.5cm width x 0.1cm depth; 0.118cm^3 volume. Character of Wound/Ulcer Post Debridement is stable. Severity of Tissue Post Debridement is: Fat layer exposed. Post procedure Diagnosis Wound #1: Same as Pre-Procedure Plan Wound Cleansing: Wound #1 Left,Midline,Posterior Lower Leg: Clean wound with Normal Saline. Wound #2 Left,Medial,Anterior Lower Leg: Clean wound with Normal Saline. Anesthetic (add to Medication List): Wound #1 Left,Midline,Posterior Lower Leg: Topical Lidocaine 4% cream applied to wound bed prior to debridement (In Clinic Only). Wound #2 Left,Medial,Anterior Lower Leg: Topical Lidocaine 4% cream applied to wound bed prior to debridement (In Clinic Only). Primary Wound Dressing: Wound #1 Left,Midline,Posterior Lower Leg: Santyl Ointment Wound #2 Left,Medial,Anterior Lower Leg: Prisma Ag - apply saline Secondary Dressing: Wound #1 Left,Midline,Posterior Lower Leg: Boardered Foam Dressing Wound #2 Left,Medial,Anterior Lower Leg: Boardered Foam Dressing Dressing Change  Frequency: Wound #1 Left,Midline,Posterior Lower Leg: Change dressing every other day. Wound #2 Left,Medial,Anterior Lower Leg: Change dressing every other day. Follow-up Appointments: Return Appointment in 1 week. Edema Control: Wound #1 Left,Midline,Posterior Lower Leg: Patient to wear own compression stockings Wound #2 Left,Medial,Anterior Lower Leg: Patient to wear own compression stockings The following medication(s) was prescribed: Santyl topical 250 unit/gram ointment ointment topical as directed starting [name], [name] ([id number]) 12/22/2016 83 year old gentleman with bilateral lower extremity edema had probably a laceration to his left lower extremity, which has been very slow to heal over time. After review have recommended: 1. Santyl ointment locally to be applied daily to the posterior calf wound and covered with a bordered foam 2. Prisma AG to the anterior shin wound covered with a bordered foam to be changed daily 3. Elevation and exercise discussed with him in great detail 4. 20-30 mm compression stockings to be applied daily except for bedtime 5. Adequate protein, vitamin A, vitamin C and zinc 6. Regular visits the wound center Electronic Signature(s) Signed: 12/23/2016 12:52:50 PM By: Christin Fudge MD, FACS Previous Signature: 12/22/2016 2:15:41 PM Version By: Christin Fudge MD, FACS Entered By: Christin Fudge on 12/23/2016 12:52:50 [name] ([id number]) -------------------------------------------------------------------------------- ROS/PFSH Details Patient Name: [name] Date of Service: 12/22/2016 12:30 PM Medical Record Number: [id number] Patient Account Number: [id number] Date of Birth/Sex: [birth year] (83 y.o. Male) Treating RN: Roger Shelter Primary Care Provider: Gaynelle Arabian Other Clinician: Referring Provider: Gaynelle Arabian Treating Provider/Extender: Frann Rider in Treatment: 0 Information Obtained From Patient Wound  History Do you currently have one or more open woundso Yes How many open wounds do you currently haveo 2 Approximately how long have you had your woundso 4 weeks How have you been treating your wound(s) until nowo neosporin Has your wound(s) ever healed and then re-openedo No Have you had any lab work done in the past montho No Have you tested positive for an antibiotic resistant organism (MRSA, VRE)o No Have you tested positive for osteomyelitis (bone infection)o No Have you had any tests for circulation on your legso No Constitutional Symptoms (General Health) Complaints and Symptoms: Negative for: Fatigue; Fever; Chills; Marked Weight  Change Eyes Complaints and Symptoms: Positive for: Vision Changes - glasses Negative for: Dry Eyes Medical History: Negative for: Cataracts; Glaucoma; Optic Neuritis Ear/Nose/Mouth/Throat Complaints and Symptoms: Negative for: Difficult clearing ears; Sinusitis Medical History: Negative for: Chronic sinus problems/congestion; Middle ear problems Hematologic/Lymphatic Complaints and Symptoms: Positive for: Bleeding / Clotting Disorders Negative for: Human Immunodeficiency Virus Medical History: Negative for: Anemia; Hemophilia; Human Immunodeficiency Virus; Lymphedema; Sickle Cell Disease Respiratory Complaints and Symptoms: Negative for: Chronic or frequent coughs; Shortness of Breath Medical History: KAYLEM, GIDNEY ([id number]) Negative for: Aspiration; Asthma; Chronic Obstructive Pulmonary Disease (COPD); Pneumothorax; Sleep Apnea; Tuberculosis Cardiovascular Complaints and Symptoms: Positive for: LE edema Negative for: Chest pain Medical History: Positive for: Arrhythmia - A- Fib Negative for: Angina; Congestive Heart Failure; Coronary Artery Disease; Deep Vein Thrombosis; Hypertension; Hypotension; Myocardial Infarction; Peripheral Arterial Disease; Peripheral Venous Disease; Phlebitis; Vasculitis Gastrointestinal Complaints and  Symptoms: Negative for: Frequent diarrhea; Nausea; Vomiting Medical History: Negative for: Cirrhosis ; Colitis; Crohnos; Hepatitis A; Hepatitis B; Hepatitis C Endocrine Complaints and Symptoms: Negative for: Hepatitis; Thyroid disease; Polydypsia (Excessive Thirst) Medical History: Negative for: Type I Diabetes; Type II Diabetes Genitourinary Complaints and Symptoms: Negative for: Kidney failure/ Dialysis; Incontinence/dribbling Medical History: Negative for: End Stage Renal Disease Immunological Complaints and Symptoms: Negative for: Hives; Itching Medical History: Negative for: Lupus Erythematosus; Raynaudos Integumentary (Skin) Complaints and Symptoms: Positive for: Wounds; Bleeding or bruising tendency; Breakdown; Swelling Medical History: Negative for: History of Burn; History of pressure wounds Musculoskeletal Complaints and Symptoms: Negative for: Muscle Pain; Muscle Weakness Medical History: [name], [name] ([id number]) Negative for: Gout; Rheumatoid Arthritis; Osteoarthritis; Osteomyelitis Neurologic Complaints and Symptoms: Positive for: Numbness/parasthesias Negative for: Focal/Weakness Medical History: Negative for: Dementia; Neuropathy; Quadriplegia; Paraplegia; Seizure Disorder Psychiatric Complaints and Symptoms: Negative for: Anxiety; Claustrophobia Medical History: Negative for: Anorexia/bulimia; Confinement Anxiety Oncologic Complaints and Symptoms: No Complaints or Symptoms Immunizations Pneumococcal Vaccine: Received Pneumococcal Vaccination: No Implantable Devices Family and Social History Cancer: No; Diabetes: Yes - Child; Heart Disease: No; Hypertension: Yes - Child; Kidney Disease: No; Lung Disease: No; Seizures: No; Stroke: No; Thyroid Problems: No; Tuberculosis: No; Former smoker - 23 years +; Marital Status - Married; Alcohol Use: Never; Drug Use: No History; Caffeine Use: Rarely; Financial Concerns: No; Food, Clothing or Shelter  Needs: No; Support System Lacking: No; Transportation Concerns: No; Advanced Directives: No; Patient does not want information on Advanced Directives; Do not resuscitate: No; Living Will: No; Medical Power of Attorney: No Physician Affirmation I have reviewed and agree with the above information. Electronic Signature(s) Signed: 12/22/2016 3:15:43 PM By: Christin Fudge MD, FACS Signed: 12/22/2016 5:14:07 PM By: Roger Shelter Entered By: Christin Fudge on 12/22/2016 13:37:32 [name] ([id number]) -------------------------------------------------------------------------------- SuperBill Details Patient Name: [name] Date of Service: 12/22/2016 Medical Record Number: [id number] Patient Account Number: [id number] Date of Birth/Sex: [birth year] (83 y.o. Male) Treating RN: Roger Shelter Primary Care Provider: Gaynelle Arabian Other Clinician: Referring Provider: Gaynelle Arabian Treating Provider/Extender: Frann Rider in Treatment: 0 Diagnosis Coding ICD-10 Codes Code Description I89.0 Lymphedema, not elsewhere classified S81.812A Laceration without foreign body, left lower leg, initial encounter L97.222 Non-pressure chronic ulcer of left calf with fat layer exposed L97.221 Non-pressure chronic ulcer of left calf limited to breakdown of skin Facility Procedures CPT4 Code: 62952841 Description: 11042 - DEB SUBQ TISSUE 20 SQ CM/< ICD-10 Diagnosis Description I89.0 Lymphedema, not elsewhere classified S81.812A Laceration without foreign body, left lower leg, initial enc L97.222 Non-pressure chronic ulcer of left calf with fat layer  expos L97.221 Non-pressure chronic  ulcer of left calf limited to breakdown Modifier: ounter ed of skin Quantity: 1 Physician Procedures CPT4 Code: 4982641 Description: 58309 - WC PHYS LEVEL 4 - NEW PT ICD-10 Diagnosis Description I89.0 Lymphedema, not elsewhere classified S81.812A Laceration without foreign body, left lower leg, initial enc  L97.222 Non-pressure chronic ulcer of left calf with fat layer  expos L97.221 Non-pressure chronic ulcer of left calf limited to breakdown Modifier: 25 ounter ed of skin Quantity: 1 CPT4 Code: [id number] Description: 81103 - WC PHYS SUBQ TISS 20 SQ CM ICD-10 Diagnosis Description I89.0 Lymphedema, not elsewhere classified S81.812A Laceration without foreign body, left lower leg, initial enc L97.222 Non-pressure chronic ulcer of left calf with fat layer  expos L97.221 Non-pressure chronic ulcer of left calf limited to breakdown Modifier: ounter ed of skin Quantity: 1 Electronic Signature(s) Signed: 12/22/2016 4:38:22 PM By: Roger Shelter Signed: 12/23/2016 12:55:29 PM By: Christin Fudge MD, FACS Previous Signature: 12/22/2016 2:16:04 PM Version By: Christin Fudge MD, FACS Entered By: Roger Shelter on 12/22/2016 16:38:22

## 2016-12-24 NOTE — Progress Notes (Signed)
Danny, Mcdonald ([id number]) Visit Report for 12/22/2016 Allergy List Details Patient Name: [name], [name]. Date of Service: 12/22/2016 12:30 PM Medical Record Number: [id number] Patient Account Number: [id number] Date of Birth/Sex: [birth year] (83 y.o. Male) Treating RN: Roger Shelter Primary Care Merland Holness: Gaynelle Arabian Other Clinician: Referring Jendaya Gossett: Gaynelle Arabian Treating Ladeidra Borys/Extender: Frann Rider in Treatment: 0 Allergies Active Allergies No Known Drug Allergies Allergy Notes Electronic Signature(s) Signed: 12/22/2016 5:14:07 PM By: Roger Shelter Entered By: Roger Shelter on 12/22/2016 13:04:24 [name] ([id number]) -------------------------------------------------------------------------------- Arrival Information Details Patient Name: [name] Date of Service: 12/22/2016 12:30 PM Medical Record Number: [id number] Patient Account Number: [id number] Date of Birth/Sex: 1930/07/16 (83 y.o. Male) Treating RN: Roger Shelter Primary Care Finlee Concepcion: Gaynelle Arabian Other Clinician: Referring Mckaylie Vasey: Gaynelle Arabian Treating Nakia Koble/Extender: Frann Rider in Treatment: 0 Visit Information Patient Arrived: Ambulatory Arrival Time: 12:48 Accompanied By: son Transfer Assistance: None Patient Identification Verified: Yes Secondary Verification Process Completed: Yes Patient Requires Transmission-Based No Precautions: Patient Has Alerts: No Electronic Signature(s) Signed: 12/22/2016 4:41:28 PM By: Roger Shelter Entered By: Roger Shelter on 12/22/2016 16:41:28 [name] ([id number]) -------------------------------------------------------------------------------- Clinic Level of Care Assessment Details Patient Name: [name] Date of Service: 12/22/2016 12:30 PM Medical Record Number: [id number] Patient Account Number: [id number] Date of Birth/Sex: 25-Dec-1930 (83 y.o. Male) Treating RN: Roger Shelter Primary Care Kandice Schmelter: Gaynelle Arabian Other Clinician: Referring Cynitha Berte: Gaynelle Arabian Treating Kimarie Coor/Extender: Frann Rider in Treatment: 0 Clinic Level of Care Assessment Items TOOL 1 Quantity Score X - Use when EandM and Procedure is performed on INITIAL visit 1 0 ASSESSMENTS - Nursing Assessment / Reassessment X - General Physical Exam (combine w/ comprehensive assessment (listed just below) when 1 20 performed on new pt. evals) X- 1 25 Comprehensive Assessment (HX, ROS, Risk Assessments, Wounds Hx, etc.) ASSESSMENTS - Wound and Skin Assessment / Reassessment []  - Dermatologic / Skin Assessment (not related to wound area) 0 ASSESSMENTS - Ostomy and/or Continence Assessment and Care []  - Incontinence Assessment and Management 0 []  - 0 Ostomy Care Assessment and Management (repouching, etc.) PROCESS - Coordination of Care []  - Simple Patient / Family Education for ongoing care 0 X- 1 20 Complex (extensive) Patient / Family Education for ongoing care X- 1 10 Staff obtains Programmer, systems, Records, Test Results / Process Orders []  - 0 Staff telephones HHA, Nursing Homes / Clarify orders / etc []  - 0 Routine Transfer to another Facility (non-emergent condition) []  - 0 Routine Hospital Admission (non-emergent condition) []  - 0 New Admissions / Biomedical engineer / Ordering NPWT, Apligraf, etc. []  - 0 Emergency Hospital Admission (emergent condition) PROCESS - Special Needs []  - Pediatric / Minor Patient Management 0 []  - 0 Isolation Patient Management []  - 0 Hearing / Language / Visual special needs []  - 0 Assessment of Community assistance (transportation, D/C planning, etc.) []  - 0 Additional assistance / Altered mentation []  - 0 Support Surface(s) Assessment (bed, cushion, seat, etc.) SAMI, FROH. ([id number]) INTERVENTIONS - Miscellaneous []  - External ear exam 0 []  - 0 Patient Transfer (multiple staff / Civil Service fast streamer / Similar devices) []  -  0 Simple Staple / Suture removal (25 or less) []  - 0 Complex Staple / Suture removal (26 or more) []  - 0 Hypo/Hyperglycemic Management (do not check if billed separately) []  - 0 Ankle / Brachial Index (ABI) - do not check if billed separately Has the patient been seen at the hospital within the last three years: Yes Total Score: 75 Level  Of Care: New/Established - Level 2 Electronic Signature(s) Signed: 12/22/2016 5:14:07 PM By: Roger Shelter Entered By: Roger Shelter on 12/22/2016 16:38:10 [name] ([id number]) -------------------------------------------------------------------------------- Encounter Discharge Information Details Patient Name: [name] Date of Service: 12/22/2016 12:30 PM Medical Record Number: [id number] Patient Account Number: [id number] Date of Birth/Sex: [birth year] (83 y.o. Male) Treating RN: Roger Shelter Primary Care Kaleeyah Cuffie: Gaynelle Arabian Other Clinician: Referring Doron Shake: Gaynelle Arabian Treating Ashtin Melichar/Extender: Frann Rider in Treatment: 0 Encounter Discharge Information Items Discharge Pain Level: 0 Discharge Condition: Stable Ambulatory Status: Ambulatory Discharge Destination: Home Transportation: Private Auto Accompanied By: son Schedule Follow-up Appointment: No Medication Reconciliation completed and No provided to Patient/Care Alixander Rallis: Patient Clinical Summary of Care: Declined Electronic Signature(s) Signed: 12/22/2016 4:47:50 PM By: Roger Shelter Entered By: Roger Shelter on 12/22/2016 16:47:50 [name] ([id number]) -------------------------------------------------------------------------------- Lower Extremity Assessment Details Patient Name: [name] Date of Service: 12/22/2016 12:30 PM Medical Record Number: [id number] Patient Account Number: [id number] Date of Birth/Sex: 02/05/1930 (83 y.o. Male) Treating RN: Roger Shelter Primary Care Rama Sorci: Gaynelle Arabian Other  Clinician: Referring Sheliah Fiorillo: Gaynelle Arabian Treating Flonnie Wierman/Extender: Frann Rider in Treatment: 0 Edema Assessment Assessed: [Left: No] [Right: No] Edema: [Left: Ye] [Right: s] Calf Left: Right: Point of Measurement: 34.5 cm From Medial Instep 37 cm cm Ankle Left: Right: Point of Measurement: 13 cm From Medial Instep 26.2 cm cm Vascular Assessment Claudication: Claudication Assessment [Left:None] Pulses: Dorsalis Pedis Palpable: [Left:No] Doppler Audible: [Left:Yes] Posterior Tibial Extremity colors, hair growth, and conditions: Extremity Color: [Left:Red] Hair Growth on Extremity: [Left:Yes] Temperature of Extremity: [Left:Cool] Capillary Refill: [Left:> 3 seconds] Blood Pressure: Brachial: [Left:130] Dorsalis Pedis: 148 [Left:Dorsalis Pedis:] Ankle: Posterior Tibial: 150 [Left:Posterior Tibial: 1.15] Toe Nail Assessment Left: Right: Thick: Yes Discolored: Yes Deformed: Yes Improper Length and Hygiene: Yes Electronic Signature(s) Signed: 12/22/2016 5:14:07 PM By: Roger Shelter Entered By: Roger Shelter on 12/22/2016 13:31:38 [name] ([id number] Mcdonald ([id number]) -------------------------------------------------------------------------------- Multi Wound Chart Details Patient Name: [name] Date of Service: 12/22/2016 12:30 PM Medical Record Number: [id number] Patient Account Number: [id number] Date of Birth/Sex: 05/05/1930 (83 y.o. Male) Treating RN: Roger Shelter Primary Care Dalan Cowger: Gaynelle Arabian Other Clinician: Referring Susen Haskew: Gaynelle Arabian Treating May Ozment/Extender: Frann Rider in Treatment: 0 Vital Signs Height(in): 72 Pulse(bpm): 80 Weight(lbs): 217 Blood Pressure(mmHg): 143/65 Body Mass Index(BMI): 29 Temperature(F): 98.0 Respiratory Rate 18 (breaths/min): Photos: [1:No Photos] [2:No Photos] [N/A:N/A] Wound Location: [1:Left Lower Leg - Midline, Anterior] [2:Left Lower Leg -  Medial, Anterior] [N/A:N/A] Wounding Event: [1:Gradually Appeared] [2:Gradually Appeared] [N/A:N/A] Primary Etiology: [1:Venous Leg Ulcer] [2:Venous Leg Ulcer] [N/A:N/A] Date Acquired: [1:12/01/2016] [2:12/08/2016] [N/A:N/A] Weeks of Treatment: [1:0] [2:0] [N/A:N/A] Wound Status: [1:Open] [2:Open] [N/A:N/A] Measurements L x W x D [1:1x1.5x0.1] [2:1.5x2.2x0.1] [N/A:N/A] (cm) Area (cm) : [1:1.178] [2:2.592] [N/A:N/A] Volume (cm) : [1:0.118] [2:0.259] [N/A:N/A] Classification: [1:Partial Thickness] [2:Partial Thickness] [N/A:N/A] Exudate Amount: [1:Medium] [2:Small] [N/A:N/A] Exudate Type: [1:Serous] [2:Serosanguineous] [N/A:N/A] Exudate Color: [1:amber] [2:red, brown] [N/A:N/A] Wound Margin: [1:Flat and Intact] [2:Flat and Intact] [N/A:N/A] Granulation Amount: [1:None Present (0%)] [2:None Present (0%)] [N/A:N/A] Necrotic Amount: [1:Large (67-100%)] [2:Large (67-100%)] [N/A:N/A] Necrotic Tissue: [1:Eschar] [2:Eschar] [N/A:N/A] Exposed Structures: [1:Fat Layer (Subcutaneous Tissue) Exposed: Yes Fascia: No Tendon: No Muscle: No Joint: No Bone: No] [2:Fascia: No Fat Layer (Subcutaneous Tissue) Exposed: No Tendon: No Muscle: No Joint: No Bone: No] [N/A:N/A] Epithelialization: [1:Large (67-100%)] [2:Large (67-100%)] [N/A:N/A] Debridement: [1:Debridement ([id number])] [2:N/A] [N/A:N/A] Pre-procedure [1:01:35] [2:N/A] [N/A:N/A] Verification/Time Out Taken: Pain Control: [1:Other] [2:N/A] [N/A:N/A] Tissue Debrided: [1:Necrotic/Eschar, Fibrin/Slough, Skin, Subcutaneous] [2:N/A] [  N/A:N/A] Level: [1:Skin/Subcutaneous Tissue] [2:N/A] [N/A:N/A] Debridement Area (sq cm): 1.5 [2:N/A] [N/A:N/A] Instrument: Curette N/A N/A Bleeding: Minimum N/A N/A Hemostasis Achieved: Pressure N/A N/A Procedural Pain: 0 N/A N/A Post Procedural Pain: 0 N/A N/A Debridement Treatment Procedure was tolerated well N/A N/A Response: Post Debridement 1x1.5x0.1 N/A N/A Measurements L x W x D (cm) Post Debridement  Volume: 0.118 N/A N/A (cm) Periwound Skin Texture: Induration: Yes Excoriation: Yes N/A Excoriation: No Induration: No Callus: No Callus: No Crepitus: No Crepitus: No Rash: No Rash: No Scarring: No Scarring: No Periwound Skin Moisture: Maceration: No Dry/Scaly: Yes N/A Dry/Scaly: No Maceration: No Periwound Skin Color: Erythema: Yes Erythema: Yes N/A Atrophie Blanche: No Atrophie Blanche: No Cyanosis: No Cyanosis: No Ecchymosis: No Ecchymosis: No Hemosiderin Staining: No Hemosiderin Staining: No Mottled: No Mottled: No Pallor: No Pallor: No Rubor: No Rubor: No Erythema Location: Circumferential Circumferential N/A Temperature: No Abnormality N/A N/A Tenderness on Palpation: Yes No N/A Wound Preparation: Ulcer Cleansing: Ulcer Cleansing: N/A Rinsed/Irrigated with Saline Rinsed/Irrigated with Saline Topical Anesthetic Applied: Topical Anesthetic Applied: Other: lidodcaine 4% Other: lidocaine 4% Procedures Performed: Debridement N/A N/A Treatment Notes Electronic Signature(s) Signed: 12/22/2016 2:08:55 PM By: Christin Fudge MD, FACS Entered By: Christin Fudge on 12/22/2016 14:08:55 [name] ([id number]) -------------------------------------------------------------------------------- [place] Details Patient Name: [name] Date of Service: 12/22/2016 12:30 PM Medical Record Number: [id number] Patient Account Number: [id number] Date of Birth/Sex: 04/12/1930 (83 y.o. Male) Treating RN: Roger Shelter Primary Care Kenedee Molesky: Gaynelle Arabian Other Clinician: Referring Hero Mccathern: Gaynelle Arabian Treating Marri Mcneff/Extender: Frann Rider in Treatment: 0 Active Inactive ` Orientation to the Wound Care Program Nursing Diagnoses: Knowledge deficit related to the wound healing center program Goals: Patient/caregiver will verbalize understanding of the [facility] Program Date Initiated: 12/22/2016 Target  Resolution Date: 01/12/2017 Goal Status: Active Interventions: Provide education on orientation to the wound center Notes: ` Wound/Skin Impairment Nursing Diagnoses: Impaired tissue integrity Knowledge deficit related to ulceration/compromised skin integrity Goals: Patient/caregiver will verbalize understanding of skin care regimen Date Initiated: 12/22/2016 Target Resolution Date: 01/12/2017 Goal Status: Active Ulcer/skin breakdown will have a volume reduction of 30% by week 4 Date Initiated: 12/22/2016 Target Resolution Date: 01/12/2017 Goal Status: Active Interventions: Assess patient/caregiver ability to obtain necessary supplies Assess ulceration(s) every visit Treatment Activities: Skin care regimen initiated : 12/22/2016 Notes: Electronic Signature(s) Signed: 12/22/2016 5:14:07 PM By: Darin Engels ([id number]) Entered By: Roger Shelter on 12/22/2016 14:00:17 [name] ([id number]) -------------------------------------------------------------------------------- Pain Assessment Details Patient Name: [name] Date of Service: 12/22/2016 12:30 PM Medical Record Number: [id number] Patient Account Number: [id number] Date of Birth/Sex: 1930-02-04 (83 y.o. Male) Treating RN: Roger Shelter Primary Care Rimsha Trembley: Gaynelle Arabian Other Clinician: Referring Retaj Hilbun: Gaynelle Arabian Treating Jailene Cupit/Extender: Frann Rider in Treatment: 0 Active Problems Location of Pain Severity and Description of Pain Patient Has Paino No Site Locations Rate the pain. Current Pain Level: 2 Character of Pain Describe the Pain: Aching Pain Management and Medication Current Pain Management: Electronic Signature(s) Signed: 12/22/2016 4:41:33 PM By: Roger Shelter Entered By: Roger Shelter on 12/22/2016 16:41:33 [name] ([id number]) -------------------------------------------------------------------------------- Patient/Caregiver  Education Details Patient Name: [name] Date of Service: 12/22/2016 12:30 PM Medical Record Number: [id number] Patient Account Number: [id number] Date of Birth/Gender: 07-07-1930 (83 y.o. Male) Treating RN: Roger Shelter Primary Care Physician: Gaynelle Arabian Other Clinician: Referring Physician: Gaynelle Arabian Treating Physician/Extender: Frann Rider in Treatment: 0 Education Assessment Education Provided To: Patient Education Topics Provided Wound Debridement: Handouts: Wound  Debridement Methods: Explain/Verbal Responses: State content correctly Wound/Skin Impairment: Handouts: Caring for Your Ulcer Methods: Explain/Verbal Responses: State content correctly Electronic Signature(s) Signed: 12/22/2016 5:14:07 PM By: Roger Shelter Entered By: Roger Shelter on 12/22/2016 16:48:21 Valent, Jeoffrey Massed ([id number]) -------------------------------------------------------------------------------- Wound Assessment Details Patient Name: [name]. Date of Service: 12/22/2016 12:30 PM Medical Record Number: [id number] Patient Account Number: [id number] Date of Birth/Sex: Jul 18, 1930 (83 y.o. Male) Treating RN: Roger Shelter Primary Care Chinwe Lope: Gaynelle Arabian Other Clinician: Referring Kiven Vangilder: Gaynelle Arabian Treating Fairy Ashlock/Extender: Frann Rider in Treatment: 0 Wound Status Wound Number: 1 Primary Etiology: Venous Leg Ulcer Wound Location: Left Lower Leg - Midline, Posterior Wound Status: Open Wounding Event: Gradually Appeared Comorbid History: Arrhythmia Date Acquired: 12/01/2016 Weeks Of Treatment: 0 Clustered Wound: No Photos Wound Measurements Length: (cm) 1 Width: (cm) 1.5 Depth: (cm) 0.1 Area: (cm) 1.178 Volume: (cm) 0.118 % Reduction in Area: 0% % Reduction in Volume: 0% Epithelialization: Large (67-100%) Tunneling: No Undermining: No Wound Description Classification: Partial Thickness Foul O Wound Margin: Flat  and Intact Slough Exudate Amount: Medium Exudate Type: Serous Exudate Color: amber dor After Cleansing: No /Fibrino Yes Wound Bed Granulation Amount: None Present (0%) Exposed Structure Necrotic Amount: Large (67-100%) Fascia Exposed: No Necrotic Quality: Eschar Fat Layer (Subcutaneous Tissue) Exposed: Yes Tendon Exposed: No Muscle Exposed: No Joint Exposed: No Bone Exposed: No Periwound Skin Texture Texture Color SEVERUS, BRODZINSKI. ([id number]) No Abnormalities Noted: No No Abnormalities Noted: No Callus: No Atrophie Blanche: No Crepitus: No Cyanosis: No Excoriation: No Ecchymosis: No Induration: Yes Erythema: Yes Rash: No Erythema Location: Circumferential Scarring: No Hemosiderin Staining: No Mottled: No Moisture Pallor: No No Abnormalities Noted: No Rubor: No Dry / Scaly: No Maceration: No Temperature / Pain Temperature: No Abnormality Tenderness on Palpation: Yes Wound Preparation Ulcer Cleansing: Rinsed/Irrigated with Saline Topical Anesthetic Applied: Other: lidodcaine 4%, Treatment Notes Wound #1 (Left, Midline, Posterior Lower Leg) 1. Cleansed with: Clean wound with Normal Saline 2. Anesthetic Topical Lidocaine 4% cream to wound bed prior to debridement 4. Dressing Applied: Santyl Ointment 5. Secondary Dressing Applied Bordered Foam Dressing Electronic Signature(s) Signed: 12/22/2016 4:43:20 PM By: Roger Shelter Entered By: Roger Shelter on 12/22/2016 16:43:20 Kassab, Jeoffrey Massed ([id number]) -------------------------------------------------------------------------------- Wound Assessment Details Patient Name: [name] Date of Service: 12/22/2016 12:30 PM Medical Record Number: [id number] Patient Account Number: [id number] Date of Birth/Sex: May 29, 1930 (83 y.o. Male) Treating RN: Roger Shelter Primary Care Thorin Starner: Gaynelle Arabian Other Clinician: Referring Satonya Lux: Gaynelle Arabian Treating Netasha Wehrli/Extender: Frann Rider in  Treatment: 0 Wound Status Wound Number: 2 Primary Etiology: Venous Leg Ulcer Wound Location: Left Lower Leg - Medial, Anterior Wound Status: Open Wounding Event: Gradually Appeared Comorbid History: Arrhythmia Date Acquired: 12/08/2016 Weeks Of Treatment: 0 Clustered Wound: No Photos Wound Measurements Length: (cm) 1.5 Width: (cm) 2.2 Depth: (cm) 0.1 Area: (cm) 2.592 Volume: (cm) 0.259 % Reduction in Area: 0% % Reduction in Volume: 0% Epithelialization: Large (67-100%) Tunneling: No Undermining: No Wound Description Classification: Partial Thickness Foul Od Wound Margin: Flat and Intact Slough/ Exudate Amount: Small Exudate Type: Serosanguineous Exudate Color: red, brown or After Cleansing: No Fibrino Yes Wound Bed Granulation Amount: None Present (0%) Exposed Structure Necrotic Amount: Large (67-100%) Fascia Exposed: No Necrotic Quality: Eschar Fat Layer (Subcutaneous Tissue) Exposed: No Tendon Exposed: No Muscle Exposed: No Joint Exposed: No Bone Exposed: No Periwound Skin Texture Texture Color Peter, Graylon J. ([id number]) No Abnormalities Noted: No No Abnormalities Noted: No Callus: No Atrophie Blanche: No Crepitus: No Cyanosis: No Excoriation: Yes Ecchymosis: No  Induration: No Erythema: Yes Rash: No Erythema Location: Circumferential Scarring: No Hemosiderin Staining: No Mottled: No Moisture Pallor: No No Abnormalities Noted: No Rubor: No Dry / Scaly: Yes Maceration: No Wound Preparation Ulcer Cleansing: Rinsed/Irrigated with Saline Topical Anesthetic Applied: Other: lidocaine 4%, Treatment Notes Wound #2 (Left, Medial, Anterior Lower Leg) 1. Cleansed with: Clean wound with Normal Saline 2. Anesthetic Topical Lidocaine 4% cream to wound bed prior to debridement 4. Dressing Applied: Prisma Ag 5. Secondary Dressing Applied Bordered Foam Dressing Electronic Signature(s) Signed: 12/22/2016 4:43:43 PM By: Roger Shelter Entered By:  Roger Shelter on 12/22/2016 16:43:43 Mesenbrink, Jeoffrey Massed ([id number]) -------------------------------------------------------------------------------- Vitals Details Patient Name: [name] Date of Service: 12/22/2016 12:30 PM Medical Record Number: [id number] Patient Account Number: [id number] Date of Birth/Sex: 1930-06-17 (83 y.o. Male) Treating RN: Roger Shelter Primary Care Corianne Buccellato: Gaynelle Arabian Other Clinician: Referring Stephane Junkins: Gaynelle Arabian Treating Dejai Schubach/Extender: Frann Rider in Treatment: 0 Vital Signs Time Taken: 12:52 Temperature (F): 98.0 Height (in): 72 Pulse (bpm): 80 Source: Stated Respiratory Rate (breaths/min): 18 Weight (lbs): 217 Blood Pressure (mmHg): 143/65 Source: Measured Reference Range: 80 - 120 mg / dl Body Mass Index (BMI): 29.4 Electronic Signature(s) Signed: 12/22/2016 5:14:07 PM By: Roger Shelter Entered By: Roger Shelter on 12/22/2016 12:53:06

## 2016-12-24 NOTE — Progress Notes (Signed)
[name] ([id number]) Visit Report for 12/22/2016 Abuse/Suicide Risk Screen Details Patient Name: [name], [name]. Date of Service: 12/22/2016 12:30 PM Medical Record Number: [id number] Patient Account Number: [id number] Date of Birth/Sex: 11-30-1930 (83 y.o. Male) Treating RN: Roger Shelter Primary Care Machele Deihl: Gaynelle Arabian Other Clinician: Referring Nivan Melendrez: Gaynelle Arabian Treating Delores Thelen/Extender: Frann Rider in Treatment: 0 Abuse/Suicide Risk Screen Items Answer ABUSE/SUICIDE RISK SCREEN: Has anyone close to you tried to hurt or harm you recentlyo No Do you feel uncomfortable with anyone in your familyo No Has anyone forced you do things that you didnot want to doo No Do you have any thoughts of harming yourselfo No Patient displays signs or symptoms of abuse and/or neglect. No Electronic Signature(s) Signed: 12/22/2016 5:14:07 PM By: Roger Shelter Entered By: Roger Shelter on 12/22/2016 13:14:01 [name] ([id number]) -------------------------------------------------------------------------------- Activities of Daily Living Details Patient Name: [name]. Date of Service: 12/22/2016 12:30 PM Medical Record Number: [id number] Patient Account Number: [id number] Date of Birth/Sex: [birth year] (83 y.o. Male) Treating RN: Roger Shelter Primary Care Shruti Arrey: Gaynelle Arabian Other Clinician: Referring Olesya Wike: Gaynelle Arabian Treating Cruz Bong/Extender: Frann Rider in Treatment: 0 Activities of Daily Living Items Answer Activities of Daily Living (Please select one for each item) Drive Automobile Completely Able Take Medications Completely Able Use Telephone Completely Able Care for Appearance Completely Able Use Toilet Completely Able Bath / Shower Completely Able Dress Self Completely Able Feed Self Completely Able Walk Completely Able Get In / Out Bed Completely Able Housework Completely Able Prepare Meals Completely  Able Handle Money Completely Able Shop for Self Completely Able Electronic Signature(s) Signed: 12/22/2016 5:14:07 PM By: Roger Shelter Entered By: Roger Shelter on 12/22/2016 13:14:33 [name] ([id number]) -------------------------------------------------------------------------------- Education Assessment Details Patient Name: [name] Date of Service: 12/22/2016 12:30 PM Medical Record Number: [id number] Patient Account Number: [id number] Date of Birth/Sex: 05-11-1930 (83 y.o. Male) Treating RN: Roger Shelter Primary Care Isamu Trammel: Gaynelle Arabian Other Clinician: Referring Atianna Haidar: Gaynelle Arabian Treating Dae Highley/Extender: Frann Rider in Treatment: 0 Primary Learner Assessed: Patient Learning Preferences/Education Level/Primary Language Learning Preference: Explanation Highest Education Level: Grade School Preferred Language: English Cognitive Barrier Assessment/Beliefs Language Barrier: No Translator Needed: No Memory Deficit: No Emotional Barrier: No Cultural/Religious Beliefs Affecting Medical Care: No Physical Barrier Assessment Impaired Vision: Yes Glasses Impaired Hearing: No Decreased Hand dexterity: No Knowledge/Comprehension Assessment Knowledge Level: High Comprehension Level: High Ability to understand written High instructions: Ability to understand verbal High instructions: Motivation Assessment Anxiety Level: Calm Cooperation: Cooperative Education Importance: Acknowledges Need Interest in Health Problems: Asks Questions Perception: Coherent Willingness to Engage in Self- High Management Activities: Readiness to Engage in Self- High Management Activities: Electronic Signature(s) Signed: 12/22/2016 5:14:07 PM By: Roger Shelter Entered By: Roger Shelter on 12/22/2016 13:15:19 [name] ([id number]) -------------------------------------------------------------------------------- Fall Risk Assessment  Details Patient Name: [name] Date of Service: 12/22/2016 12:30 PM Medical Record Number: [id number] Patient Account Number: [id number] Date of Birth/Sex: 12-Jun-1930 (83 y.o. Male) Treating RN: Roger Shelter Primary Care Bellatrix [place]: Gaynelle Arabian Other Clinician: Referring Shaquille Janes: Gaynelle Arabian Treating Faizan Geraci/Extender: Frann Rider in Treatment: 0 Fall Risk Assessment Items Have you had 2 or more falls in the last 12 monthso 0 No Have you had any fall that resulted in injury in the last 12 monthso 0 No FALL RISK ASSESSMENT: History of falling - immediate or within 3 months 0 No Secondary diagnosis 0 No Ambulatory aid None/bed rest/wheelchair/nurse 0 No Crutches/cane/walker 0 No Furniture 0 No IV Access/Saline Lock  0 No Gait/Training Normal/bed rest/immobile 0 No Weak 0 No Impaired 0 No Mental Status Oriented to own ability 0 No Electronic Signature(s) Signed: 12/22/2016 5:14:07 PM By: Roger Shelter Entered By: Roger Shelter on 12/22/2016 13:16:24 [name] ([id number]) -------------------------------------------------------------------------------- Foot Assessment Details Patient Name: [name] Date of Service: 12/22/2016 12:30 PM Medical Record Number: [id number] Patient Account Number: [id number] Date of Birth/Sex: 08/06/1930 (83 y.o. Male) Treating RN: Roger Shelter Primary Care [place] Dost: Gaynelle Arabian Other Clinician: Referring Armany Mano: Gaynelle Arabian Treating Patricia Perales/Extender: Frann Rider in Treatment: 0 Foot Assessment Items Site Locations + = Sensation present, - = Sensation absent, C = Callus, U = Ulcer R = Redness, W = Warmth, M = Maceration, PU = Pre-ulcerative lesion F = Fissure, S = Swelling, D = Dryness Assessment Right: Left: Other Deformity: No No Prior Foot Ulcer: No No Prior Amputation: No No Charcot Joint: No No Ambulatory Status: Ambulatory Without Help Gait: Steady Electronic  Signature(s) Signed: 12/22/2016 5:14:07 PM By: Roger Shelter Entered By: Roger Shelter on 12/22/2016 13:18:06 [name] ([id number]) -------------------------------------------------------------------------------- Nutrition Risk Assessment Details Patient Name: [name] Date of Service: 12/22/2016 12:30 PM Medical Record Number: [id number] Patient Account Number: [id number] Date of Birth/Sex: Jul 12, 1930 (83 y.o. Male) Treating RN: Roger Shelter Primary Care Juanna Pudlo: Gaynelle Arabian Other Clinician: Referring Jeanmarie Mccowen: Gaynelle Arabian Treating Cendy Oconnor/Extender: Frann Rider in Treatment: 0 Height (in): 72 Weight (lbs): 217 Body Mass Index (BMI): 29.4 Nutrition Risk Assessment Items NUTRITION RISK SCREEN: I have an illness or condition that made me change the kind and/or amount of 0 No food I eat I eat fewer than two meals per day 0 No I eat few fruits and vegetables, or milk products 0 No I have three or more drinks of beer, liquor or wine almost every day 0 No I have tooth or mouth problems that make it hard for me to eat 0 No I don't always have enough money to buy the food I need 0 No I eat alone most of the time 0 No I take three or more different prescribed or over-the-counter drugs a day 0 No Without wanting to, I have lost or gained 10 pounds in the last six months 0 No I am not always physically able to shop, cook and/or feed myself 0 No Nutrition Protocols Good Risk Protocol 0 No interventions needed Moderate Risk Protocol Electronic Signature(s) Signed: 12/22/2016 5:14:07 PM By: Roger Shelter Entered By: Roger Shelter on 12/22/2016 13:16:38

## 2016-12-29 ENCOUNTER — Ambulatory Visit (INDEPENDENT_AMBULATORY_CARE_PROVIDER_SITE_OTHER): Payer: Medicare Other | Admitting: *Deleted

## 2016-12-29 ENCOUNTER — Encounter: Payer: Medicare Other | Admitting: Nurse Practitioner

## 2016-12-29 DIAGNOSIS — Z5181 Encounter for therapeutic drug level monitoring: Secondary | ICD-10-CM

## 2016-12-29 DIAGNOSIS — I4891 Unspecified atrial fibrillation: Secondary | ICD-10-CM

## 2016-12-29 DIAGNOSIS — I6789 Other cerebrovascular disease: Secondary | ICD-10-CM | POA: Diagnosis not present

## 2016-12-29 DIAGNOSIS — I890 Lymphedema, not elsewhere classified: Secondary | ICD-10-CM | POA: Diagnosis not present

## 2016-12-29 LAB — POCT INR: INR: 1.8

## 2016-12-29 NOTE — Patient Instructions (Signed)
Description   Today take 1.5 tablets, then Continue  taking 1 tablet daily except 1.5 tablets on Wednesdays.  Recheck in 3 weeks. Call us with any medication changes [phone]

## 2016-12-31 NOTE — Progress Notes (Signed)
Danny, Mcdonald ([id number]) Visit Report for 12/29/2016 Chief Complaint Document Details Patient Name: [name], [name]. Date of Service: 12/29/2016 2:45 PM Medical Record Number: [id number] Patient Account Number: [id number] Date of Birth/Sex: November 07, 1930 (83 y.o. Male) Treating RN: Roger Shelter Primary Care Provider: Gaynelle Arabian Other Clinician: Referring Provider: Gaynelle Arabian Treating Provider/Extender: Cathie Olden in Treatment: 1 Information Obtained from: Patient Chief Complaint He is seen for follow up evaluation of lle ulcers Electronic Signature(s) Signed: 12/29/2016 4:55:16 PM By: Lawanda Cousins Entered By: Lawanda Cousins on 12/29/2016 16:27:22 [name] ([id number]) -------------------------------------------------------------------------------- Debridement Details Patient Name: [name] Date of Service: 12/29/2016 2:45 PM Medical Record Number: [id number] Patient Account Number: [id number] Date of Birth/Sex: 23-Oct-1930 (83 y.o. Male) Treating RN: Roger Shelter Primary Care Provider: Gaynelle Arabian Other Clinician: Referring Provider: Gaynelle Arabian Treating Provider/Extender: Cathie Olden in Treatment: 1 Debridement Performed for Wound #1 Left,Midline,Posterior Lower Leg Assessment: Performed By: Physician Lawanda Cousins, NP Debridement: Debridement Severity of Tissue Pre Fat layer exposed Debridement: Pre-procedure Verification/Time Yes - 03:10 Out Taken: Start Time: 03:10 Pain Control: Other : lidocaine 4% Level: Skin/Subcutaneous Tissue Total Area Debrided (L x W): 1.2 (cm) x 2 (cm) = 2.4 (cm) Tissue and other material Viable, Non-Viable, Eschar, Fibrin/Slough, Skin, Subcutaneous debrided: Instrument: Curette Bleeding: Moderate Hemostasis Achieved: Pressure End Time: 03:12 Procedural Pain: 0 Post Procedural Pain: 0 Response to Treatment: Procedure was tolerated well Post Debridement Measurements of Total  Wound Length: (cm) 1.2 Width: (cm) 2 Depth: (cm) 0.3 Volume: (cm) 0.565 Character of Wound/Ulcer Post Debridement: Stable Severity of Tissue Post Debridement: Fat layer exposed Post Procedure Diagnosis Same as Pre-procedure Electronic Signature(s) Signed: 12/29/2016 4:37:13 PM By: Roger Shelter Signed: 12/29/2016 4:55:16 PM By: Lawanda Cousins Entered By: Lawanda Cousins on 12/29/2016 16:26:57 [name] ([id number]) -------------------------------------------------------------------------------- HPI Details Patient Name: [name] Date of Service: 12/29/2016 2:45 PM Medical Record Number: [id number] Patient Account Number: [id number] Date of Birth/Sex: 1930/04/22 (83 y.o. Male) Treating RN: Roger Shelter Primary Care Provider: Gaynelle Arabian Other Clinician: Referring Provider: Gaynelle Arabian Treating Provider/Extender: Cathie Olden in Treatment: 1 History of Present Illness Location: left lower extremity swelling with ulceration on the posterior calf and anterior shin Quality: Patient reports experiencing an aching pain to affected area(s). Severity: Patient states wound are getting worse. Duration: Patient has had the wound for < 6 weeks prior to presenting for treatment Timing: Pain in wound is Intermittent (comes and goes Context: The wound appeared gradually over time Modifying Factors: Other treatment(s) tried include:and seen his PCP who thought there was cellulitis and infection and put him on doxycycline for 2 weeks Associated Signs and Symptoms: Patient reports having increase swelling. HPI Description: 83 year old gentleman has been referred to [place] by his PCP Dr. Gaynelle Arabian, for a ulcer on the left posterior lower leg which has been there for about 6 weeks. The patient was thought to have an infection and was put on doxycycline for 14 days and referred to the wound center. He has a past medical history of paroxysmal atrial  fibrillation, hypertension, diverticulosis, status post right hip replacement, appendectomy. He has not been a smoker since 43. the anterior shin wound has been more recent about 2 weeks and this was caused probably due to a blunt abrasion. 12-29-16 He is here in follow up evaluation of lle ulcers. He was unable to afford santyl, purchased medihoney per recommendations; drainage is variable. He voices no complaints or concerns. will continue with medihoney Electronic Signature(s) Signed: 12/29/2016 4:55:16  PM By: Lawanda Cousins Entered By: Lawanda Cousins on 12/29/2016 16:28:47 [name] ([id number]) -------------------------------------------------------------------------------- Physician Orders Details Patient Name: [name] Date of Service: 12/29/2016 2:45 PM Medical Record Number: [id number] Patient Account Number: [id number] Date of Birth/Sex: April 02, 1930 (82 y.o. Male) Treating RN: Roger Shelter Primary Care Provider: Gaynelle Arabian Other Clinician: Referring Provider: Gaynelle Arabian Treating Provider/Extender: Cathie Olden in Treatment: 1 Verbal / Phone Orders: No Diagnosis Coding Wound Cleansing Wound #1 Left,Midline,Posterior Lower Leg o Clean wound with Normal Saline. Wound #2 Left,Medial,Anterior Lower Leg o Clean wound with Normal Saline. Anesthetic (add to Medication List) Wound #1 Left,Midline,Posterior Lower Leg o Topical Lidocaine 4% cream applied to wound bed prior to debridement (In Clinic Only). Wound #2 Left,Medial,Anterior Lower Leg o Topical Lidocaine 4% cream applied to wound bed prior to debridement (In Clinic Only). Primary Wound Dressing Wound #1 Left,Midline,Posterior Lower Leg o Santyl Ointment Wound #2 Left,Medial,Anterior Lower Leg o Prisma Ag - apply saline Secondary Dressing Wound #1 Left,Midline,Posterior Lower Leg o Boardered Foam Dressing Wound #2 Left,Medial,Anterior Lower Leg o Boardered Foam  Dressing Dressing Change Frequency Wound #1 Left,Midline,Posterior Lower Leg o Change dressing every other day. Wound #2 Left,Medial,Anterior Lower Leg o Change dressing every other day. Follow-up Appointments o Return Appointment in 1 week. Edema Control Wound #1 Left,Midline,Posterior Lower Leg o Patient to wear own compression stockings Wound #2 Left,Medial,Anterior Lower Leg MENDELL, BONTEMPO ([id number]) o Patient to wear own compression stockings Electronic Signature(s) Signed: 12/29/2016 4:55:16 PM By: Lawanda Cousins Entered By: Lawanda Cousins on 12/29/2016 16:29:17 [name] ([id number]) -------------------------------------------------------------------------------- Problem List Details Patient Name: [name] Date of Service: 12/29/2016 2:45 PM Medical Record Number: [id number] Patient Account Number: [id number] Date of Birth/Sex: 1930-04-13 (83 y.o. Male) Treating RN: Roger Shelter Primary Care Provider: Gaynelle Arabian Other Clinician: Referring Provider: Gaynelle Arabian Treating Provider/Extender: Cathie Olden in Treatment: 1 Active Problems ICD-10 Encounter Code Description Active Date Diagnosis I89.0 Lymphedema, not elsewhere classified 12/22/2016 Yes S81.812A Laceration without foreign body, left lower leg, initial encounter 12/22/2016 Yes L97.222 Non-pressure chronic ulcer of left calf with fat layer exposed 12/22/2016 Yes L97.221 Non-pressure chronic ulcer of left calf limited to breakdown of skin 12/22/2016 Yes Inactive Problems Resolved Problems Electronic Signature(s) Signed: 12/29/2016 4:55:16 PM By: Lawanda Cousins Entered By: Lawanda Cousins on 12/29/2016 16:26:20 [name] ([id number]) -------------------------------------------------------------------------------- Progress Note Details Patient Name: [name] Date of Service: 12/29/2016 2:45 PM Medical Record Number: [id number] Patient Account Number:  [id number] Date of Birth/Sex: [birth year] (83 y.o. Male) Treating RN: Roger Shelter Primary Care Provider: Gaynelle Arabian Other Clinician: Referring Provider: Gaynelle Arabian Treating Provider/Extender: Cathie Olden in Treatment: 1 Subjective Chief Complaint Information obtained from Patient He is seen for follow up evaluation of lle ulcers History of Present Illness (HPI) The following HPI elements were documented for the patient's wound: Location: left lower extremity swelling with ulceration on the posterior calf and anterior shin Quality: Patient reports experiencing an aching pain to affected area(s). Severity: Patient states wound are getting worse. Duration: Patient has had the wound for < 6 weeks prior to presenting for treatment Timing: Pain in wound is Intermittent (comes and goes Context: The wound appeared gradually over time Modifying Factors: Other treatment(s) tried include:and seen his PCP who thought there was cellulitis and infection and put him on doxycycline for 2 weeks Associated Signs and Symptoms: Patient reports having increase swelling. 83 year old gentleman has been referred to [place] by his PCP Dr. Gaynelle Arabian, for a ulcer on  the left posterior lower leg which has been there for about 6 weeks. The patient was thought to have an infection and was put on doxycycline for 14 days and referred to the wound center. He has a past medical history of paroxysmal atrial fibrillation, hypertension, diverticulosis, status post right hip replacement, appendectomy. He has not been a smoker since 5. the anterior shin wound has been more recent about 2 weeks and this was caused probably due to a blunt abrasion. 12-29-16 He is here in follow up evaluation of lle ulcers. He was unable to afford santyl, purchased medihoney per recommendations; drainage is variable. He voices no complaints or concerns. will continue with medihoney Patient History Information obtained  from Patient. Family History Diabetes - Child, Hypertension - Child, No family history of Cancer, Heart Disease, Kidney Disease, Lung Disease, Seizures, Stroke, Thyroid Problems, Tuberculosis. Social History Former smoker - 54 years +, Marital Status - Married, Alcohol Use - Never, Drug Use - No History, Caffeine Use - Rarely. URBAN, [employer] ([id number]) Objective Constitutional Vitals Time Taken: 2:45 AM, Height: 72 in, Weight: 217 lbs, BMI: 29.4, Temperature: 97.7 F, Pulse: 82 bpm, Respiratory Rate: 18 breaths/min, Blood Pressure: 135/76 mmHg. Integumentary (Hair, Skin) Wound #1 status is Open. Original cause of wound was Gradually Appeared. The wound is located on the Left,Midline,Posterior Lower Leg. The wound measures 1.2cm length x 2cm width x 0.3cm depth; 1.885cm^2 area and 0.565cm^3 volume. There is Fat Layer (Subcutaneous Tissue) Exposed exposed. There is a medium amount of serous drainage noted. The wound margin is flat and intact. There is no granulation within the wound bed. There is a large (67-100%) amount of necrotic tissue within the wound bed including Eschar. The periwound skin appearance exhibited: Induration, Erythema. The periwound skin appearance did not exhibit: Callus, Crepitus, Excoriation, Rash, Scarring, Dry/Scaly, Maceration, Atrophie Blanche, Cyanosis, Ecchymosis, Hemosiderin Staining, Mottled, Pallor, Rubor. The surrounding wound skin color is noted with erythema which is circumferential. Periwound temperature was noted as No Abnormality. The periwound has tenderness on palpation. Wound #2 status is Open. Original cause of wound was Gradually Appeared. The wound is located on the Left,Medial,Anterior Lower Leg. The wound measures 1.1cm length x 1.9cm width x 0.1cm depth; 1.641cm^2 area and 0.164cm^3 volume. There is a small amount of serosanguineous drainage noted. The wound margin is flat and intact. There is no granulation within the wound bed. There is a  large (67-100%) amount of necrotic tissue within the wound bed including Eschar. The periwound skin appearance exhibited: Excoriation, Dry/Scaly, Erythema. The periwound skin appearance did not exhibit: Callus, Crepitus, Induration, Rash, Scarring, Maceration, Atrophie Blanche, Cyanosis, Ecchymosis, Hemosiderin Staining, Mottled, Pallor, Rubor. The surrounding wound skin color is noted with erythema which is circumferential. Wound #3 status is Open. Original cause of wound was Gradually Appeared. The wound is located on the Distal,Medial Lower Leg. The wound measures 0.4cm length x 0.6cm width x 0.1cm depth; 0.188cm^2 area and 0.019cm^3 volume. There is Fat Layer (Subcutaneous Tissue) Exposed exposed. There is no tunneling or undermining noted. There is a small amount of serous drainage noted. The wound margin is flat and intact. There is large (67-100%) red granulation within the wound bed. There is a small (1-33%) amount of necrotic tissue within the wound bed including Adherent Slough. The periwound skin appearance exhibited: Excoriation. The periwound skin appearance did not exhibit: Callus, Crepitus, Induration, Rash, Scarring, Dry/Scaly, Maceration, Atrophie Blanche, Cyanosis, Ecchymosis, Hemosiderin Staining, Mottled, Pallor, Rubor, Erythema. 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 There was a Skin/Subcutaneous Tissue Debridement ([id number]) debridement with total area of 2.4 sq cm performed by Lawanda Cousins,  NP. with the following instrument(s): Curette to remove Viable and Non-Viable tissue/material including Fibrin/Slough, Eschar, Skin, and Subcutaneous after achieving pain control using Other (lidocaine 4%). A time out was conducted at 03:10, prior to the start of the procedure. A Moderate amount of bleeding was controlled with Pressure. The procedure was tolerated well with a pain level of 0 throughout and a pain level of 0 following the procedure. Post Debridement Measurements: 1.2cm length x 2cm width x 0.3cm depth; 0.565cm^3 volume. Character of Wound/Ulcer Post Debridement is stable. Severity of Tissue Post Debridement is: Fat layer exposed. Post procedure Diagnosis Wound #1: Same as Pre-Procedure Plan Wound Cleansing: Wound #1 Left,Midline,Posterior Lower Leg: Clean wound with Normal Saline. Wound #2 Left,Medial,Anterior Lower Leg: Clean wound with Normal Saline. Anesthetic (add to Medication List): Wound #1 Left,Midline,Posterior Lower Leg: Topical Lidocaine 4% cream applied to wound bed prior to debridement (In Clinic Only). Wound #2 Left,Medial,Anterior Lower Leg: Topical Lidocaine 4% cream applied to wound bed prior to debridement (In Clinic Only). Primary Wound Dressing: Wound #1 Left,Midline,Posterior Lower Leg: Santyl Ointment Wound #2 Left,Medial,Anterior Lower Leg: Prisma Ag - apply saline Secondary Dressing: Wound #1 Left,Midline,Posterior Lower Leg: Boardered Foam Dressing Wound #2 Left,Medial,Anterior Lower Leg: Boardered Foam Dressing Dressing Change Frequency: Wound #1 Left,Midline,Posterior Lower Leg: Change dressing every other day. Wound #2 Left,Medial,Anterior Lower Leg: Change dressing every other day. Follow-up Appointments: Return Appointment in 1 week. Edema Control: Wound #1 Left,Midline,Posterior Lower Leg: Patient to wear own compression stockings Wound #2 Left,Medial,Anterior Lower Leg: Patient to wear own compression stockings 1. continue with  medihoney to posterior ulcer, prisma to anterior ulcer 2. follow up next week AIMEE, TIMMONS ([id number]) Electronic Signature(s) Signed: 12/29/2016 4:55:16 PM By: Lawanda Cousins Entered By: Lawanda Cousins on 12/29/2016 16:54:58 [name] ([id number]) -------------------------------------------------------------------------------- ROS/PFSH Details Patient Name: [name] Date of Service: 12/29/2016 2:45 PM Medical Record Number: [id number] Patient Account Number: [id number] Date of Birth/Sex: 1930/01/24 (83 y.o. Male) Treating RN: Roger Shelter Primary Care Provider: Gaynelle Arabian Other Clinician: Referring Provider: Gaynelle Arabian Treating Provider/Extender: Cathie Olden in Treatment: 1 Information Obtained From Patient Wound History Do you currently have one or more open woundso Yes How many open wounds do you currently haveo 2 Approximately how long have you had your woundso 4 weeks How have you been treating your wound(s) until nowo neosporin Has your wound(s) ever healed and then re-openedo No Have you had any lab work done in the past montho No Have you tested positive for an antibiotic resistant organism (MRSA, VRE)o No Have you tested positive for osteomyelitis (bone infection)o No Have you had any tests for circulation on your legso No Eyes Medical History: Negative for: Cataracts; Glaucoma; Optic Neuritis Ear/Nose/Mouth/Throat Medical History: Negative for: Chronic sinus problems/congestion; Middle ear problems Hematologic/Lymphatic Medical History: Negative for: Anemia; Hemophilia; Human Immunodeficiency Virus; Lymphedema; Sickle Cell Disease Respiratory Medical History: Negative for: Aspiration; Asthma; Chronic Obstructive Pulmonary Disease (COPD); Pneumothorax; Sleep Apnea; Tuberculosis Cardiovascular Medical History: Positive for: Arrhythmia -  A- Fib Negative for: Angina; Congestive Heart Failure; Coronary Artery Disease; Deep Vein  Thrombosis; Hypertension; Hypotension; Myocardial Infarction; Peripheral Arterial Disease; Peripheral Venous Disease; Phlebitis; Vasculitis Gastrointestinal Medical History: Negative for: Cirrhosis ; Colitis; Crohnos; Hepatitis A; Hepatitis B; Hepatitis C Endocrine WYLEE, DORANTES ([id number]) Medical History: Negative for: Type I Diabetes; Type II Diabetes Genitourinary Medical History: Negative for: End Stage Renal Disease Immunological Medical History: Negative for: Lupus Erythematosus; Raynaudos Integumentary (Skin) Medical History: Negative for: History of Burn; History of pressure wounds Musculoskeletal Medical History: Negative for: Gout; Rheumatoid Arthritis; Osteoarthritis; Osteomyelitis Neurologic Medical History: Negative for: Dementia; Neuropathy; Quadriplegia; Paraplegia; Seizure Disorder Psychiatric Medical History: Negative for: Anorexia/bulimia; Confinement Anxiety Immunizations Pneumococcal Vaccine: Received Pneumococcal Vaccination: No Implantable Devices Family and Social History Cancer: No; Diabetes: Yes - Child; Heart Disease: No; Hypertension: Yes - Child; Kidney Disease: No; Lung Disease: No; Seizures: No; Stroke: No; Thyroid Problems: No; Tuberculosis: No; Former smoker - 29 years +; Marital Status - Married; Alcohol Use: Never; Drug Use: No History; Caffeine Use: Rarely; Financial Concerns: No; Food, Clothing or Shelter Needs: No; Support System Lacking: No; Transportation Concerns: No; Advanced Directives: No; Patient does not want information on Advanced Directives; Do not resuscitate: No; Living Will: No; Medical Power of Attorney: No Physician Affirmation I have reviewed and agree with the above information. Electronic Signature(s) Signed: 12/29/2016 4:37:13 PM By: Roger Shelter Signed: 12/29/2016 4:55:16 PM By: Lawanda Cousins Entered By: Lawanda Cousins on 12/29/2016 16:29:01 [name]  ([id number]) -------------------------------------------------------------------------------- SuperBill Details Patient Name: [name] Date of Service: 12/29/2016 Medical Record Number: [id number] Patient Account Number: [id number] Date of Birth/Sex: 1930-10-22 (83 y.o. Male) Treating RN: Roger Shelter Primary Care Provider: Gaynelle Arabian Other Clinician: Referring Provider: Gaynelle Arabian Treating Provider/Extender: Cathie Olden in Treatment: 1 Diagnosis Coding ICD-10 Codes Code Description I89.0 Lymphedema, not elsewhere classified S81.812A Laceration without foreign body, left lower leg, initial encounter L97.222 Non-pressure chronic ulcer of left calf with fat layer exposed L97.221 Non-pressure chronic ulcer of left calf limited to breakdown of skin Facility Procedures CPT4 Code: [id number] Description: 11042 - DEB SUBQ TISSUE 20 SQ CM/< ICD-10 Diagnosis Description L97.222 Non-pressure chronic ulcer of left calf with fat layer expo Modifier: sed Quantity: 1 Physician Procedures CPT4 Code: [id number] Description: 53664 - WC PHYS SUBQ TISS 20 SQ CM ICD-10 Diagnosis Description L97.222 Non-pressure chronic ulcer of left calf with fat layer expo Modifier: sed Quantity: 1 Electronic Signature(s) Signed: 12/29/2016 4:55:16 PM By: Lawanda Cousins Entered By: Lawanda Cousins on 12/29/2016 16:33:01

## 2017-01-04 NOTE — Progress Notes (Signed)
ULAS, ZUERCHER ([id number]) Visit Report for 12/29/2016 Arrival Information Details Patient Name: [name], [name]. Date of Service: 12/29/2016 2:45 PM Medical Record Number: [id number] Patient Account Number: [id number] Date of Birth/Sex: 1930/06/02 (83 y.o. Male) Treating RN: Roger Shelter Primary Care Jamiere Gulas: Gaynelle Arabian Other Clinician: Referring Rushie Brazel: Gaynelle Arabian Treating Anna Livers/Extender: Cathie Olden in Treatment: 1 Visit Information History Since Last Visit All ordered tests and consults were completed: No Patient Arrived: Ambulatory Added or deleted any medications: No Arrival Time: 14:42 Any new allergies or adverse reactions: No Accompanied By: WIFE Had a fall or experienced change in No Transfer Assistance: None activities of daily living that may affect Patient Requires Transmission-Based No risk of falls: Precautions: Signs or symptoms of abuse/neglect since last visito No Patient Has Alerts: No Pain Present Now: Yes Electronic Signature(s) Signed: 12/29/2016 4:37:13 PM By: Roger Shelter Entered By: Roger Shelter on 12/29/2016 14:43:47 [name] ([id number]) -------------------------------------------------------------------------------- Encounter Discharge Information Details Patient Name: [name] Date of Service: 12/29/2016 2:45 PM Medical Record Number: [id number] Patient Account Number: [id number] Date of Birth/Sex: 1931/01/03 (83 y.o. Male) Treating RN: Roger Shelter Primary Care Anguel Delapena: Gaynelle Arabian Other Clinician: Referring Jalesia Loudenslager: Gaynelle Arabian Treating Damaris Geers/Extender: Cathie Olden in Treatment: 1 Encounter Discharge Information Items Schedule Follow-up Appointment: No Medication Reconciliation completed and No provided to Patient/Care Layal Javid: Provided on Clinical Summary of Care: 12/29/2016 Form Type Recipient Paper Patient AN Electronic Signature(s) Signed: 01/04/2017 10:27:05  AM By: Ruthine Dose Entered By: Ruthine Dose on 12/29/2016 15:33:36 [name] ([id number]) -------------------------------------------------------------------------------- Lower Extremity Assessment Details Patient Name: [name] Date of Service: 12/29/2016 2:45 PM Medical Record Number: [id number] Patient Account Number: [id number] Date of Birth/Sex: 04-26-1930 (83 y.o. Male) Treating RN: Roger Shelter Primary Care Ilda Laskin: Gaynelle Arabian Other Clinician: Referring Estelle Greenleaf: Gaynelle Arabian Treating Xavien Dauphinais/Extender: Cathie Olden in Treatment: 1 Edema Assessment Assessed: [Left: No] [Right: No] Edema: [Left: Ye] [Right: s] Calf Left: Right: Point of Measurement: 34.5 cm From Medial Instep 36.6 cm cm Ankle Left: Right: Point of Measurement: 13 cm From Medial Instep 25.6 cm cm Vascular Assessment Claudication: Claudication Assessment [Left:None] Pulses: Dorsalis Pedis Palpable: [Left:Yes] Posterior Tibial Extremity colors, hair growth, and conditions: Extremity Color: [Left:Hyperpigmented] Hair Growth on Extremity: [Left:Yes] Temperature of Extremity: [Left:Warm] Capillary Refill: [Left:< 3 seconds] Toe Nail Assessment Left: Right: Thick: Yes Discolored: Yes Deformed: Yes Improper Length and Hygiene: Yes Electronic Signature(s) Signed: 12/29/2016 4:37:13 PM By: Roger Shelter Entered By: Roger Shelter on 12/29/2016 15:00:42 [name] ([id number]) -------------------------------------------------------------------------------- Multi Wound Chart Details Patient Name: [name] Date of Service: 12/29/2016 2:45 PM Medical Record Number: [id number] Patient Account Number: [id number] Date of Birth/Sex: August 03, 1930 (83 y.o. Male) Treating RN: Roger Shelter Primary Care Braedon Sjogren: Gaynelle Arabian Other Clinician: Referring Rodnesha Elie: Gaynelle Arabian Treating Leven Hoel/Extender: Cathie Olden in Treatment: 1 Vital  Signs Height(in): 72 Pulse(bpm): 82 Weight(lbs): 217 Blood Pressure(mmHg): 135/76 Body Mass Index(BMI): 29 Temperature(F): 97.7 Respiratory Rate 18 (breaths/min): Photos: [1:No Photos] [2:No Photos] [3:No Photos] Wound Location: [1:Left, Midline, Posterior Lower Leg] [2:Left Lower Leg - Medial, Anterior] [3:Lower Leg - Medial, Distal] Wounding Event: [1:Gradually Appeared] [2:Gradually Appeared] [3:Gradually Appeared] Primary Etiology: [1:Venous Leg Ulcer] [2:Venous Leg Ulcer] [3:Pressure Ulcer] Comorbid History: [1:N/A] [2:Arrhythmia] [3:Arrhythmia] Date Acquired: [1:12/01/2016] [2:12/08/2016] [3:12/27/2016] Weeks of Treatment: [1:1] [2:1] [3:0] Wound Status: [1:Open] [2:Open] [3:Open] Measurements L x W x D [1:1.2x2x0.3] [2:1.1x1.9x0.1] [3:0.4x0.6x0.1] (cm) Area (cm) : [1:1.885] [2:1.641] [3:0.188] Volume (cm) : [1:0.565] [2:0.164] [3:0.019] % Reduction in Area: [1:-60.00%] [2:36.70%] [3:N/A] %  Reduction in Volume: [1:-378.80%] [2:36.70%] [3:N/A] Classification: [1:Partial Thickness] [2:Partial Thickness] [3:Category/Stage II] Exudate Amount: [1:N/A] [2:Small] [3:Small] Exudate Type: [1:N/A] [2:Serosanguineous] [3:Serous] Exudate Color: [1:N/A] [2:red, brown] [3:amber] Wound Margin: [1:N/A] [2:Flat and Intact] [3:Flat and Intact] Granulation Amount: [1:N/A] [2:None Present (0%)] [3:Large (67-100%)] Granulation Quality: [1:N/A] [2:N/A] [3:Red] Necrotic Amount: [1:N/A] [2:Large (67-100%)] [3:Small (1-33%)] Necrotic Tissue: [1:N/A] [2:Eschar] [3:Adherent Slough] Epithelialization: [1:N/A] [2:Large (67-100%)] [3:None] Debridement: [1:Debridement ([id number])] [2:N/A] [3:N/A] Pre-procedure [1:03:10] [2:N/A] [3:N/A] Verification/Time Out Taken: Pain Control: [1:Other] [2:N/A] [3:N/A] Tissue Debrided: [1:Necrotic/Eschar, Fibrin/Slough, Skin, Subcutaneous] [2:N/A] [3:N/A] Level: [1:Skin/Subcutaneous Tissue] [2:N/A] [3:N/A] Debridement Area (sq cm): [1:2.4] [2:N/A]  [3:N/A] Instrument: [1:Curette] [2:N/A] [3:N/A] Bleeding: [1:Moderate] [2:N/A] [3:N/A] Hemostasis Achieved: [1:Pressure] [2:N/A] [3:N/A] Procedural Pain: 0 N/A N/A Post Procedural Pain: 0 N/A N/A Debridement Treatment Procedure was tolerated well N/A N/A Response: Post Debridement 1.2x2x0.3 N/A N/A Measurements L x W x D (cm) Post Debridement Volume: 0.565 N/A N/A (cm) Periwound Skin Texture: No Abnormalities Noted Excoriation: Yes Excoriation: Yes Induration: No Induration: No Callus: No Callus: No Crepitus: No Crepitus: No Rash: No Rash: No Scarring: No Scarring: No Periwound Skin Moisture: No Abnormalities Noted Dry/Scaly: Yes Maceration: No Maceration: No Dry/Scaly: No Periwound Skin Color: No Abnormalities Noted Erythema: Yes Atrophie Blanche: No Atrophie Blanche: No Cyanosis: No Cyanosis: No Ecchymosis: No Ecchymosis: No Erythema: No Hemosiderin Staining: No Hemosiderin Staining: No Mottled: No Mottled: No Pallor: No Pallor: No Rubor: No Rubor: No Erythema Location: N/A Circumferential N/A Tenderness on Palpation: No No No Wound Preparation: N/A Ulcer Cleansing: Ulcer Cleansing: Rinsed/Irrigated with Saline Rinsed/Irrigated with Saline Topical Anesthetic Applied: Topical Anesthetic Applied: Other: lidocaine 4% Other: lidocaine 4% Procedures Performed: Debridement N/A N/A Treatment Notes Electronic Signature(s) Signed: 12/29/2016 4:55:16 PM By: Lawanda Cousins Entered By: Lawanda Cousins on 12/29/2016 16:26:29 [name] ([id number]) -------------------------------------------------------------------------------- [place] Details Patient Name: [name] Date of Service: 12/29/2016 2:45 PM Medical Record Number: [id number] Patient Account Number: [id number] Date of Birth/Sex: 16-Mar-1930 (83 y.o. Male) Treating RN: Roger Shelter Primary Care Wayland Baik: Gaynelle Arabian Other Clinician: Referring Layani Foronda: Gaynelle Arabian Treating Jadis Pitter/Extender: Cathie Olden in Treatment: 1 Active Inactive ` Orientation to the Wound Care Program Nursing Diagnoses: Knowledge deficit related to the wound healing center program Goals: Patient/caregiver will verbalize understanding of the [place] Program Date Initiated: 12/22/2016 Target Resolution Date: 01/12/2017 Goal Status: Active Interventions: Provide education on orientation to the wound center Notes: ` Wound/Skin Impairment Nursing Diagnoses: Impaired tissue integrity Knowledge deficit related to ulceration/compromised skin integrity Goals: Patient/caregiver will verbalize understanding of skin care regimen Date Initiated: 12/22/2016 Target Resolution Date: 01/12/2017 Goal Status: Active Ulcer/skin breakdown will have a volume reduction of 30% by week 4 Date Initiated: 12/22/2016 Target Resolution Date: 01/12/2017 Goal Status: Active Interventions: Assess patient/caregiver ability to obtain necessary supplies Assess ulceration(s) every visit Treatment Activities: Skin care regimen initiated : 12/22/2016 Notes: Electronic Signature(s) Signed: 12/29/2016 4:37:13 PM By: Darin Engels ([id number]) Entered By: Roger Shelter on 12/29/2016 15:04:10 [name] ([id number]) -------------------------------------------------------------------------------- Pain Assessment Details Patient Name: [name] Date of Service: 12/29/2016 2:45 PM Medical Record Number: [id number] Patient Account Number: [id number] Date of Birth/Sex: November 26, 1930 (83 y.o. Male) Treating RN: Roger Shelter Primary Care Brannon Decaire: Gaynelle Arabian Other Clinician: Referring Vivien Barretto: Gaynelle Arabian Treating Jadelynn Boylan/Extender: Cathie Olden in Treatment: 1 Active Problems Location of Pain Severity and Description of Pain Patient Has Paino Yes Site Locations Duration of the Pain. Constant / Intermittento Intermittent Rate  the pain. Current Pain Level: 4  Character of Pain Describe the Pain: Aching Pain Management and Medication Current Pain Management: Electronic Signature(s) Signed: 12/29/2016 4:37:13 PM By: Roger Shelter Entered By: Roger Shelter on 12/29/2016 14:44:24 [name] ([id number]) -------------------------------------------------------------------------------- Wound Assessment Details Patient Name: [name]. Date of Service: 12/29/2016 2:45 PM Medical Record Number: [id number] Patient Account Number: [id number] Date of Birth/Sex: 01-12-1930 (83 y.o. Male) Treating RN: Roger Shelter Primary Care Suzan Manon: Gaynelle Arabian Other Clinician: Referring Jaeleen Inzunza: Gaynelle Arabian Treating Pruitt Taboada/Extender: Cathie Olden in Treatment: 1 Wound Status Wound Number: 1 Primary Etiology: Venous Leg Ulcer Wound Location: Left Lower Leg - Midline, Posterior Wound Status: Open Wounding Event: Gradually Appeared Comorbid History: Arrhythmia Date Acquired: 12/01/2016 Weeks Of Treatment: 1 Clustered Wound: No Photos Wound Measurements Length: (cm) 1.2 Width: (cm) 2 Depth: (cm) 0.3 Area: (cm) 1.885 Volume: (cm) 0.565 % Reduction in Area: -60% % Reduction in Volume: -378.8% Epithelialization: Large (67-100%) Wound Description Classification: Partial Thickness Foul O Wound Margin: Flat and Intact Slough Exudate Amount: Medium Exudate Type: Serous Exudate Color: amber dor After Cleansing: No /Fibrino Yes Wound Bed Granulation Amount: None Present (0%) Exposed Structure Necrotic Amount: Large (67-100%) Fascia Exposed: No Necrotic Quality: Eschar Fat Layer (Subcutaneous Tissue) Exposed: Yes Tendon Exposed: No Muscle Exposed: No Joint Exposed: No Bone Exposed: No Periwound Skin Texture Texture Color KILAN, BANFILL. ([id number]) No Abnormalities Noted: No No Abnormalities Noted: No Callus: No Atrophie Blanche: No Crepitus: No Cyanosis: No Excoriation:  No Ecchymosis: No Induration: Yes Erythema: Yes Rash: No Erythema Location: Circumferential Scarring: No Hemosiderin Staining: No Mottled: No Moisture Pallor: No No Abnormalities Noted: No Rubor: No Dry / Scaly: No Maceration: No Temperature / Pain Temperature: No Abnormality Tenderness on Palpation: Yes Wound Preparation Ulcer Cleansing: Rinsed/Irrigated with Saline Topical Anesthetic Applied: Other: lidodcaine 4%, Electronic Signature(s) Signed: 12/29/2016 4:32:36 PM By: Roger Shelter Entered By: Roger Shelter on 12/29/2016 16:32:36 [name] ([id number]) -------------------------------------------------------------------------------- Wound Assessment Details Patient Name: [name] Date of Service: 12/29/2016 2:45 PM Medical Record Number: [id number] Patient Account Number: [id number] Date of Birth/Sex: 12-23-1930 (83 y.o. Male) Treating RN: Roger Shelter Primary Care Idania Desouza: Gaynelle Arabian Other Clinician: Referring Phylisha Dix: Gaynelle Arabian Treating Ameir Faria/Extender: Cathie Olden in Treatment: 1 Wound Status Wound Number: 2 Primary Etiology: Venous Leg Ulcer Wound Location: Left Lower Leg - Medial, Anterior Wound Status: Open Wounding Event: Gradually Appeared Comorbid History: Arrhythmia Date Acquired: 12/08/2016 Weeks Of Treatment: 1 Clustered Wound: No Photos Wound Measurements Length: (cm) 1.1 Width: (cm) 1.9 Depth: (cm) 0.1 Area: (cm) 1.641 Volume: (cm) 0.164 % Reduction in Area: 36.7% % Reduction in Volume: 36.7% Epithelialization: Large (67-100%) Wound Description Classification: Partial Thickness Foul Odo Wound Margin: Flat and Intact Slough/F Exudate Amount: Small Exudate Type: Serosanguineous Exudate Color: red, brown r After Cleansing: No ibrino Yes Wound Bed Granulation Amount: None Present (0%) Exposed Structure Necrotic Amount: Large (67-100%) Fascia Exposed: No Necrotic Quality: Eschar Fat Layer  (Subcutaneous Tissue) Exposed: No Tendon Exposed: No Muscle Exposed: No Joint Exposed: No Bone Exposed: No Periwound Skin Texture Texture Color MAICOL, BOWLAND. ([id number]) No Abnormalities Noted: No No Abnormalities Noted: No Callus: No Atrophie Blanche: No Crepitus: No Cyanosis: No Excoriation: Yes Ecchymosis: No Induration: No Erythema: Yes Rash: No Erythema Location: Circumferential Scarring: No Hemosiderin Staining: No Mottled: No Moisture Pallor: No No Abnormalities Noted: No Rubor: No Dry / Scaly: Yes Maceration: No Wound Preparation Ulcer Cleansing: Rinsed/Irrigated with Saline Topical Anesthetic Applied: Other: lidocaine 4%, Electronic Signature(s) Signed: 12/29/2016 4:35:19 PM By: Roger Shelter Previous Signature:  12/29/2016 4:33:33 PM Version By: Roger Shelter Entered By: Roger Shelter on 12/29/2016 16:35:18 [name] ([id number]) -------------------------------------------------------------------------------- Wound Assessment Details Patient Name: [name] Date of Service: 12/29/2016 2:45 PM Medical Record Number: [id number] Patient Account Number: [id number] Date of Birth/Sex: 10/11/1930 (83 y.o. Male) Treating RN: Roger Shelter Primary Care Aynslee Mulhall: Gaynelle Arabian Other Clinician: Referring Mayci Haning: Gaynelle Arabian Treating Tereza Gilham/Extender: Cathie Olden in Treatment: 1 Wound Status Wound Number: 3 Primary Etiology: Pressure Ulcer Wound Location: Lower Leg - Medial, Distal Wound Status: Open Wounding Event: Gradually Appeared Comorbid History: Arrhythmia Date Acquired: 12/27/2016 Weeks Of Treatment: 0 Clustered Wound: No Photos Wound Measurements Length: (cm) 0.4 % Reducti Width: (cm) 0.6 % Reducti Depth: (cm) 0.1 Epithelia Area: (cm) 0.188 Tunnelin Volume: (cm) 0.019 Undermin on in Area: 0% on in Volume: 0% lization: None g: No ing: No Wound Description Classification: Category/Stage II Foul  Odor Wound Margin: Flat and Intact Slough/Fi Exudate Amount: Small Exudate Type: Serous Exudate Color: amber After Cleansing: No brino No Wound Bed Granulation Amount: Large (67-100%) Exposed Structure Granulation Quality: Red Fat Layer (Subcutaneous Tissue) Exposed: Yes Necrotic Amount: Small (1-33%) Necrotic Quality: Adherent Slough Periwound Skin Texture Texture Color No Abnormalities Noted: No No Abnormalities Noted: No Callus: No Atrophie Blanche: No Crepitus: No Cyanosis: No VERDIS, KOVAL ([id number]) Excoriation: Yes Ecchymosis: No Induration: No Erythema: No Rash: No Hemosiderin Staining: No Scarring: No Mottled: No Pallor: No Moisture Rubor: No No Abnormalities Noted: No Dry / Scaly: No Maceration: No Wound Preparation Ulcer Cleansing: Rinsed/Irrigated with Saline Topical Anesthetic Applied: Other: lidocaine 4%, Electronic Signature(s) Signed: 12/29/2016 4:35:03 PM By: Roger Shelter Entered By: Roger Shelter on 12/29/2016 16:35:03 [name] ([id number]) -------------------------------------------------------------------------------- Vitals Details Patient Name: [name] Date of Service: 12/29/2016 2:45 PM Medical Record Number: [id number] Patient Account Number: [id number] Date of Birth/Sex: [birth year] (83 y.o. Male) Treating RN: Roger Shelter Primary Care Ahonesty Woodfin: Gaynelle Arabian Other Clinician: Referring Delmon Andrada: Gaynelle Arabian Treating Naquita Nappier/Extender: Cathie Olden in Treatment: 1 Vital Signs Time Taken: 02:45 Temperature (F): 97.7 Height (in): 72 Pulse (bpm): 82 Weight (lbs): 217 Respiratory Rate (breaths/min): 18 Body Mass Index (BMI): 29.4 Blood Pressure (mmHg): 135/76 Reference Range: 80 - 120 mg / dl Electronic Signature(s) Signed: 12/29/2016 4:37:13 PM By: Roger Shelter Entered By: Roger Shelter on 12/29/2016 14:46:02

## 2017-01-06 ENCOUNTER — Encounter: Payer: Medicare Other | Admitting: Surgery

## 2017-01-06 DIAGNOSIS — I890 Lymphedema, not elsewhere classified: Secondary | ICD-10-CM | POA: Diagnosis not present

## 2017-01-06 NOTE — Progress Notes (Addendum)
TRAE, BOVENZI ([id number]) Visit Report for 01/06/2017 Chief Complaint Document Details Patient Name: [name]. Date of Service: 01/06/2017 10:15 AM Medical Record Number: [id number] Patient Account Number: [id number] Date of Birth/Sex: 05-01-1930 (83 y.o. Male) Treating RN: Roger Shelter Primary Care Provider: Gaynelle Arabian Other Clinician: Referring Provider: Gaynelle Arabian Treating Provider/Extender: Frann Rider in Treatment: 2 Information Obtained from: Patient Chief Complaint He is seen for follow up evaluation of lle ulcers Electronic Signature(s) Signed: 01/06/2017 10:47:22 AM By: Christin Fudge MD, FACS Entered By: Christin Fudge on 01/06/2017 10:47:22 [name] ([id number]) -------------------------------------------------------------------------------- Debridement Details Patient Name: [name] Date of Service: 01/06/2017 10:15 AM Medical Record Number: [id number] Patient Account Number: [id number] Date of Birth/Sex: 10/14/1930 (83 y.o. Male) Treating RN: Roger Shelter Primary Care Provider: Gaynelle Arabian Other Clinician: Referring Provider: Gaynelle Arabian Treating Provider/Extender: Frann Rider in Treatment: 2 Debridement Performed for Wound #1 Left,Midline,Posterior Lower Leg Assessment: Performed By: Physician Christin Fudge, MD Debridement: Debridement Severity of Tissue Pre Fat layer exposed Debridement: Pre-procedure Verification/Time Yes - 10:22 Out Taken: Start Time: 10:22 Pain Control: Other : lidocaine 4% Level: Skin/Subcutaneous Tissue Total Area Debrided (L x W): 1.3 (cm) x 2 (cm) = 2.6 (cm) Tissue and other material Viable, Non-Viable, Fibrin/Slough, Skin, Subcutaneous debrided: Instrument: Curette Bleeding: Moderate Hemostasis Achieved: Pressure End Time: 10:23 Procedural Pain: 0 Post Procedural Pain: 0 Response to Treatment: Procedure was tolerated well Post Debridement Measurements of Total  Wound Length: (cm) 1.3 Width: (cm) 2 Depth: (cm) 0.2 Volume: (cm) 0.408 Character of Wound/Ulcer Post Debridement: Stable Severity of Tissue Post Debridement: Fat layer exposed Post Procedure Diagnosis Same as Pre-procedure Electronic Signature(s) Signed: 01/06/2017 1:42:42 PM By: Christin Fudge MD, FACS Signed: 01/06/2017 2:36:38 PM By: Roger Shelter Previous Signature: 01/06/2017 10:47:14 AM Version By: Christin Fudge MD, FACS Entered By: Roger Shelter on 01/06/2017 10:50:55 [name] ([id number]) -------------------------------------------------------------------------------- HPI Details Patient Name: [name] Date of Service: 01/06/2017 10:15 AM Medical Record Number: [id number] Patient Account Number: [id number] Date of Birth/Sex: 10-May-1930 (83 y.o. Male) Treating RN: Roger Shelter Primary Care Provider: Gaynelle Arabian Other Clinician: Referring Provider: Gaynelle Arabian Treating Provider/Extender: Frann Rider in Treatment: 2 History of Present Illness Location: left lower extremity swelling with ulceration on the posterior calf and anterior shin Quality: Patient reports experiencing an aching pain to affected area(s). Severity: Patient states wound are getting worse. Duration: Patient has had the wound for < 6 weeks prior to presenting for treatment Timing: Pain in wound is Intermittent (comes and goes Context: The wound appeared gradually over time Modifying Factors: Other treatment(s) tried include:and seen his PCP who thought there was cellulitis and infection and put him on doxycycline for 2 weeks Associated Signs and Symptoms: Patient reports having increase swelling. HPI Description: 83 year old gentleman has been referred to [place] by his PCP Dr. Gaynelle Arabian, for a ulcer on the left posterior lower leg which has been there for about 6 weeks. The patient was thought to have an infection and was put on doxycycline for 14 days and referred  to the wound center. He has a past medical history of paroxysmal atrial fibrillation, hypertension, diverticulosis, status post right hip replacement, appendectomy. He has not been a smoker since 5. the anterior shin wound has been more recent about 2 weeks and this was caused probably due to a blunt abrasion. 12-29-16 He is here in follow up evaluation of lle ulcers. He was unable to afford santyl, purchased medihoney per recommendations; drainage is variable. He  voices no complaints or concerns. will continue with medihoney. 01/06/2017 -- the patient did not get Santyl ointment because they went to [facility] and they would not accept his coupons. I have recommended [facility] and have given him a prescription today. Electronic Signature(s) Signed: 01/06/2017 10:47:56 AM By: Christin Fudge MD, FACS Entered By: Christin Fudge on 01/06/2017 10:47:55 [name] ([id number]) -------------------------------------------------------------------------------- Physical Exam Details Patient Name: [name] Date of Service: 01/06/2017 10:15 AM Medical Record Number: [id number] Patient Account Number: [id number] Date of Birth/Sex: [birth year] (83 y.o. Male) Treating RN: Roger Shelter Primary Care Provider: Gaynelle Arabian Other Clinician: Referring Provider: Gaynelle Arabian Treating Provider/Extender: Frann Rider in Treatment: 2 Constitutional . Pulse regular. Respirations normal and unlabored. Afebrile. . Eyes Nonicteric. Reactive to light. Ears, Nose, Mouth, and Throat Lips, teeth, and gums WNL.Marland Kitchen Moist mucosa without lesions. Neck supple and nontender. No palpable supraclavicular or cervical adenopathy. Normal sized without goiter. Respiratory WNL. No retractions.. Cardiovascular Pedal Pulses WNL. No clubbing, cyanosis or edema. Lymphatic No adneopathy. No adenopathy. No adenopathy. Musculoskeletal Adexa without tenderness or enlargement.. Digits and  nails w/o clubbing, cyanosis, infection, petechiae, ischemia, or inflammatory conditions.. Integumentary (Hair, Skin) No suspicious lesions. No crepitus or fluctuance. No peri-wound warmth or erythema. No masses.Marland Kitchen Psychiatric Judgement and insight Intact.. No evidence of depression, anxiety, or agitation.. Notes the lymphedema is better controlled and the necrotic wound on the left posterior calf needed sharp debridement with #3 curet and bleeding was controlled with pressure. The anterior shin wound is looking good and some of it is already healed Electronic Signature(s) Signed: 01/06/2017 10:48:49 AM By: Christin Fudge MD, FACS Entered By: Christin Fudge on 01/06/2017 10:48:48 [name] ([id number]) -------------------------------------------------------------------------------- Physician Orders Details Patient Name: [name] Date of Service: 01/06/2017 10:15 AM Medical Record Number: [id number] Patient Account Number: [id number] Date of Birth/Sex: 07-Oct-1930 (83 y.o. Male) Treating RN: Roger Shelter Primary Care Provider: Gaynelle Arabian Other Clinician: Referring Provider: Gaynelle Arabian Treating Provider/Extender: Frann Rider in Treatment: 2 Verbal / Phone Orders: No Diagnosis Coding ICD-10 Coding Code Description I89.0 Lymphedema, not elsewhere classified S81.812A Laceration without foreign body, left lower leg, initial encounter L97.222 Non-pressure chronic ulcer of left calf with fat layer exposed L97.221 Non-pressure chronic ulcer of left calf limited to breakdown of skin Wound Cleansing Wound #1 Left,Midline,Posterior Lower Leg o Clean wound with Normal Saline. Wound #2 Left,Medial,Anterior Lower Leg o Clean wound with Normal Saline. o Clean wound with Normal Saline. Wound #3 Distal,Medial Lower Leg o Clean wound with Normal Saline. Anesthetic (add to Medication List) Wound #1 Left,Midline,Posterior Lower Leg o Topical Lidocaine 4%  cream applied to wound bed prior to debridement (In Clinic Only). Wound #2 Left,Medial,Anterior Lower Leg o Topical Lidocaine 4% cream applied to wound bed prior to debridement (In Clinic Only). Primary Wound Dressing Wound #1 Left,Midline,Posterior Lower Leg o Santyl Ointment Wound #2 Left,Medial,Anterior Lower Leg o Prisma Ag - apply saline Wound #3 Distal,Medial Lower Leg o Prisma Ag - apply saline Secondary Dressing Wound #1 Left,Midline,Posterior Lower Leg o Boardered Foam Dressing Wound #2 Left,Medial,Anterior Lower Leg o Boardered Foam Dressing [name], [name] ([id number]) Dressing Change Frequency Wound #1 Left,Midline,Posterior Lower Leg o Change dressing every day. Wound #2 Left,Medial,Anterior Lower Leg o Change dressing every other day. Follow-up Appointments o Return Appointment in 1 week. Edema Control Wound #1 Left,Midline,Posterior Lower Leg o Patient to wear own compression stockings Wound #2 Left,Medial,Anterior Lower Leg o Patient to wear own compression  stockings Electronic Signature(s) Signed: 01/06/2017 1:42:42 PM By: Christin Fudge MD, FACS Signed: 01/06/2017 2:36:38 PM By: Roger Shelter Entered By: Roger Shelter on 01/06/2017 10:51:59 [name] ([id number]) -------------------------------------------------------------------------------- Problem List Details Patient Name: TALOR, CHEEMA. Date of Service: 01/06/2017 10:15 AM Medical Record Number: [id number] Patient Account Number: [id number] Date of Birth/Sex: [birth year] (83 y.o. Male) Treating RN: Roger Shelter Primary Care Provider: Gaynelle Arabian Other Clinician: Referring Provider: Gaynelle Arabian Treating Provider/Extender: Frann Rider in Treatment: 2 Active Problems ICD-10 Encounter Code Description Active Date Diagnosis I89.0 Lymphedema, not elsewhere classified 12/22/2016 Yes S81.812A Laceration without foreign body, left lower leg, initial  encounter 12/22/2016 Yes L97.222 Non-pressure chronic ulcer of left calf with fat layer exposed 12/22/2016 Yes L97.221 Non-pressure chronic ulcer of left calf limited to breakdown of skin 12/22/2016 Yes Inactive Problems Resolved Problems Electronic Signature(s) Signed: 01/06/2017 10:45:33 AM By: Christin Fudge MD, FACS Entered By: Christin Fudge on 01/06/2017 10:45:32 [name] ([id number]) -------------------------------------------------------------------------------- Progress Note Details Patient Name: [name] Date of Service: 01/06/2017 10:15 AM Medical Record Number: [id number] Patient Account Number: [id number] Date of Birth/Sex: 28-Jun-1930 (82 y.o. Male) Treating RN: Roger Shelter Primary Care Provider: Gaynelle Arabian Other Clinician: Referring Provider: Gaynelle Arabian Treating Provider/Extender: Frann Rider in Treatment: 2 Subjective Chief Complaint Information obtained from Patient He is seen for follow up evaluation of lle ulcers History of Present Illness (HPI) The following HPI elements were documented for the patient's wound: Location: left lower extremity swelling with ulceration on the posterior calf and anterior shin Quality: Patient reports experiencing an aching pain to affected area(s). Severity: Patient states wound are getting worse. Duration: Patient has had the wound for < 6 weeks prior to presenting for treatment Timing: Pain in wound is Intermittent (comes and goes Context: The wound appeared gradually over time Modifying Factors: Other treatment(s) tried include:and seen his PCP who thought there was cellulitis and infection and put him on doxycycline for 2 weeks Associated Signs and Symptoms: Patient reports having increase swelling. 83 year old gentleman has been referred to [place] by his PCP Dr. Gaynelle Arabian, for a ulcer on the left posterior lower leg which has been there for about 6 weeks. The patient was thought to have an  infection and was put on doxycycline for 14 days and referred to the wound center. He has a past medical history of paroxysmal atrial fibrillation, hypertension, diverticulosis, status post right hip replacement, appendectomy. He has not been a smoker since 25. the anterior shin wound has been more recent about 2 weeks and this was caused probably due to a blunt abrasion. 12-29-16 He is here in follow up evaluation of lle ulcers. He was unable to afford santyl, purchased medihoney per recommendations; drainage is variable. He voices no complaints or concerns. will continue with medihoney. 01/06/2017 -- the patient did not get Santyl ointment because they went to [facility] and they would not accept his coupons. I have recommended [facility] and have given him a prescription today. Patient History Information obtained from Patient. Family History Diabetes - Child, Hypertension - Child, No family history of Cancer, Heart Disease, Kidney Disease, Lung Disease, Seizures, Stroke, Thyroid Problems, Tuberculosis. Social History Former smoker - 57 years +, Marital Status - Married, Alcohol Use - Never, Drug Use - No History, Caffeine Use - Rarely. [name], [name] ([id number]) Objective Constitutional Pulse regular. Respirations normal and unlabored. Afebrile. Vitals Time Taken: 10:09 AM, Height: 72 in, Weight: 217 lbs, BMI: 29.4, Temperature:  97.9 F, Pulse: 84 bpm, Blood Pressure: 160/74 mmHg. Eyes Nonicteric. Reactive to light. Ears, Nose, Mouth, and Throat Lips, teeth, and gums WNL.Marland Kitchen Moist mucosa without lesions. Neck supple and nontender. No palpable supraclavicular or cervical adenopathy. Normal sized without goiter. Respiratory WNL. No retractions.. Cardiovascular Pedal Pulses WNL. No clubbing, cyanosis or edema. Lymphatic No adneopathy. No adenopathy. No adenopathy. Musculoskeletal Adexa without tenderness or enlargement.. Digits and nails w/o clubbing,  cyanosis, infection, petechiae, ischemia, or inflammatory conditions.Marland Kitchen Psychiatric Judgement and insight Intact.. No evidence of depression, anxiety, or agitation.. General Notes: the lymphedema is better controlled and the necrotic wound on the left posterior calf needed sharp debridement with #3 curet and bleeding was controlled with pressure. The anterior shin wound is looking good and some of it is already healed Integumentary (Hair, Skin) No suspicious lesions. No crepitus or fluctuance. No peri-wound warmth or erythema. No masses.. Wound #1 status is Open. Original cause of wound was Gradually Appeared. The wound is located on the Left,Midline,Posterior Lower Leg. The wound measures 1.3cm length x 2cm width x 0.2cm depth; 2.042cm^2 area and 0.408cm^3 volume. There is Fat Layer (Subcutaneous Tissue) Exposed exposed. There is no tunneling or undermining noted. There is a large amount of serous drainage noted. The wound margin is flat and intact. There is no granulation within the wound bed. There is a large (67-100%) amount of necrotic tissue within the wound bed including Adherent Slough. The periwound skin appearance exhibited: Induration, Erythema. The periwound skin appearance did not exhibit: Callus, Crepitus, Excoriation, Rash, Scarring, Dry/Scaly, Maceration, Atrophie Blanche, Cyanosis, Ecchymosis, Hemosiderin Staining, Mottled, Pallor, Rubor. The surrounding wound skin color is noted with erythema which is circumferential. Periwound temperature was noted as No Abnormality. The periwound has tenderness on palpation. Wound #2 status is Open. Original cause of wound was Gradually Appeared. The wound is located on the Left,Medial,Anterior Lower Leg. The wound measures 0.2cm length x 0.2cm width x 0.1cm depth; 0.031cm^2 area and 0.003cm^3 volume. There is no tunneling or undermining noted. There is a none present amount of drainage noted. The wound margin is flat and intact. There is no  granulation within the wound bed. There is a large (67-100%) amount of necrotic [name], [name]. ([id number]) tissue within the wound bed including Eschar. The periwound skin appearance exhibited: Dry/Scaly, Erythema. The periwound skin appearance did not exhibit: Callus, Crepitus, Excoriation, Induration, Rash, Scarring, Maceration, Atrophie Blanche, Cyanosis, Ecchymosis, Hemosiderin Staining, Mottled, Pallor, Rubor. The surrounding wound skin color is noted with erythema which is circumferential. Wound #3 status is Open. Original cause of wound was Gradually Appeared. The wound is located on the Distal,Medial Lower Leg. The wound measures 0.1cm length x 0.1cm width x 0.1cm depth; 0.008cm^2 area and 0.001cm^3 volume. There is no tunneling or undermining noted. There is a none present amount of drainage noted. The wound margin is flat and intact. There is no granulation within the wound bed. There is a large (67-100%) amount of necrotic tissue within the wound bed including Eschar. The periwound skin appearance did not exhibit: Callus, Crepitus, Excoriation, Induration, Rash, Scarring, Dry/Scaly, Maceration, Atrophie Blanche, Cyanosis, Ecchymosis, Hemosiderin Staining, Mottled, Pallor, Rubor, Erythema. Periwound temperature was noted as No Abnormality. Assessment Active Problems ICD-10 I89.0 - Lymphedema, not elsewhere classified S81.812A - Laceration without foreign body, left lower leg, initial encounter L97.222 - Non-pressure chronic ulcer of left calf with fat layer exposed L97.221 - Non-pressure chronic ulcer of left calf limited to breakdown of skin Procedures Wound #1 Pre-procedure diagnosis of Wound #1 is a  Venous Leg Ulcer located on the Left,Midline,Posterior Lower Leg .Severity of Tissue Pre Debridement is: Fat layer exposed. There was a Skin/Subcutaneous Tissue Debridement ([id number]) debridement with total area of 2.6 sq cm performed by Christin Fudge, MD. with the following  instrument(s): Curette to remove Viable and Non-Viable tissue/material including Fibrin/Slough, Skin, and Subcutaneous after achieving pain control using Other (lidocaine 4%). A time out was conducted at 10:22, prior to the start of the procedure. A Moderate amount of bleeding was controlled with Pressure. The procedure was tolerated well with a pain level of 0 throughout and a pain level of 0 following the procedure. Post Debridement Measurements: 1.3cm length x 2cm width x 0.2cm depth; 0.408cm^3 volume. Character of Wound/Ulcer Post Debridement is stable. Severity of Tissue Post Debridement is: Fat layer exposed. Post procedure Diagnosis Wound #1: Same as Pre-Procedure Plan Wound Cleansing: Wound #1 Left,Midline,Posterior Lower Leg: Clean wound with Normal Saline. Wound #2 Left,Medial,Anterior Lower Leg: Clean wound with Normal Saline. Clean wound with Normal Saline. Wound #3 Distal,Medial Lower Leg: [name], [name] ([id number]) Clean wound with Normal Saline. Anesthetic (add to Medication List): Wound #1 Left,Midline,Posterior Lower Leg: Topical Lidocaine 4% cream applied to wound bed prior to debridement (In Clinic Only). Wound #2 Left,Medial,Anterior Lower Leg: Topical Lidocaine 4% cream applied to wound bed prior to debridement (In Clinic Only). Primary Wound Dressing: Wound #1 Left,Midline,Posterior Lower Leg: Santyl Ointment Wound #2 Left,Medial,Anterior Lower Leg: Prisma Ag - apply saline Wound #3 Distal,Medial Lower Leg: Prisma Ag - apply saline Secondary Dressing: Wound #1 Left,Midline,Posterior Lower Leg: Boardered Foam Dressing Wound #2 Left,Medial,Anterior Lower Leg: Boardered Foam Dressing Dressing Change Frequency: Wound #1 Left,Midline,Posterior Lower Leg: Change dressing every day. Wound #2 Left,Medial,Anterior Lower Leg: Change dressing every other day. Follow-up Appointments: Return Appointment in 1 week. Edema Control: Wound #1 Left,Midline,Posterior  Lower Leg: Patient to wear own compression stockings Wound #2 Left,Medial,Anterior Lower Leg: Patient to wear own compression stockings the patient has been instructed to take a written prescription and [facility] with Santyl [facility], so that he can get his medication if it is better affordable. After Sharp debridement and review today, I have recommended: 1. Santyl ointment locally to be applied daily to the posterior calf wound and covered with a bordered foam 2. Prisma AG to the anterior shin wound covered with a bordered foam to be changed daily 3. Elevation and exercise discussed with him in great detail 4. 20-30 mm compression stockings to be applied daily except for bedtime 5. Adequate protein, vitamin A, vitamin C and zinc 6. Regular visits the wound center Electronic Signature(s) Signed: 01/06/2017 1:45:13 PM By: Christin Fudge MD, FACS Previous Signature: 01/06/2017 10:50:19 AM Version By: Christin Fudge MD, FACS Entered By: Christin Fudge on 01/06/2017 13:45:12 [name] ([id number]) -------------------------------------------------------------------------------- ROS/PFSH Details Patient Name: [name] Date of Service: 01/06/2017 10:15 AM Medical Record Number: [id number] Patient Account Number: [id number] Date of Birth/Sex: [birth year] (83 y.o. Male) Treating RN: Roger Shelter Primary Care Provider: Gaynelle Arabian Other Clinician: Referring Provider: Gaynelle Arabian Treating Provider/Extender: Frann Rider in Treatment: 2 Information Obtained From Patient Wound History Do you currently have one or more open woundso Yes How many open wounds do you currently haveo 2 Approximately how long have you had your woundso 4 weeks How have you been treating your wound(s) until nowo neosporin Has your wound(s) ever healed and then re-openedo No Have you had any lab work done in the past montho No Have you tested positive  for an antibiotic resistant  organism (MRSA, VRE)o No Have you tested positive for osteomyelitis (bone infection)o No Have you had any tests for circulation on your legso No Eyes Medical History: Negative for: Cataracts; Glaucoma; Optic Neuritis Ear/Nose/Mouth/Throat Medical History: Negative for: Chronic sinus problems/congestion; Middle ear problems Hematologic/Lymphatic Medical History: Negative for: Anemia; Hemophilia; Human Immunodeficiency Virus; Lymphedema; Sickle Cell Disease Respiratory Medical History: Negative for: Aspiration; Asthma; Chronic Obstructive Pulmonary Disease (COPD); Pneumothorax; Sleep Apnea; Tuberculosis Cardiovascular Medical History: Positive for: Arrhythmia - A- Fib Negative for: Angina; Congestive Heart Failure; Coronary Artery Disease; Deep Vein Thrombosis; Hypertension; Hypotension; Myocardial Infarction; Peripheral Arterial Disease; Peripheral Venous Disease; Phlebitis; Vasculitis Gastrointestinal Medical History: Negative for: Cirrhosis ; Colitis; Crohnos; Hepatitis A; Hepatitis B; Hepatitis C Endocrine [name], [name] ([id number]) Medical History: Negative for: Type I Diabetes; Type II Diabetes Genitourinary Medical History: Negative for: End Stage Renal Disease Immunological Medical History: Negative for: Lupus Erythematosus; Raynaudos Integumentary (Skin) Medical History: Negative for: History of Burn; History of pressure wounds Musculoskeletal Medical History: Negative for: Gout; Rheumatoid Arthritis; Osteoarthritis; Osteomyelitis Neurologic Medical History: Negative for: Dementia; Neuropathy; Quadriplegia; Paraplegia; Seizure Disorder Psychiatric Medical History: Negative for: Anorexia/bulimia; Confinement Anxiety Immunizations Pneumococcal Vaccine: Received Pneumococcal Vaccination: No Implantable Devices Family and Social History Cancer: No; Diabetes: Yes - Child; Heart Disease: No; Hypertension: Yes - Child; Kidney Disease: No; Lung Disease:  No; Seizures: No; Stroke: No; Thyroid Problems: No; Tuberculosis: No; Former smoker - 19 years +; Marital Status - Married; Alcohol Use: Never; Drug Use: No History; Caffeine Use: Rarely; Financial Concerns: No; Food, Clothing or Shelter Needs: No; Support System Lacking: No; Transportation Concerns: No; Advanced Directives: No; Patient does not want information on Advanced Directives; Do not resuscitate: No; Living Will: No; Medical Power of Attorney: No Physician Affirmation I have reviewed and agree with the above information. Electronic Signature(s) Signed: 01/06/2017 1:42:42 PM By: Christin Fudge MD, FACS Signed: 01/06/2017 2:36:38 PM By: Roger Shelter Entered By: Christin Fudge on 01/06/2017 10:48:05 [name] ([id number]) -------------------------------------------------------------------------------- SuperBill Details Patient Name: [name] Date of Service: 01/06/2017 Medical Record Number: [id number] Patient Account Number: [id number] Date of Birth/Sex: 1930/12/23 (83 y.o. Male) Treating RN: Roger Shelter Primary Care Provider: Gaynelle Arabian Other Clinician: Referring Provider: Gaynelle Arabian Treating Provider/Extender: Frann Rider in Treatment: 2 Diagnosis Coding ICD-10 Codes Code Description I89.0 Lymphedema, not elsewhere classified S81.812A Laceration without foreign body, left lower leg, initial encounter L97.222 Non-pressure chronic ulcer of left calf with fat layer exposed L97.221 Non-pressure chronic ulcer of left calf limited to breakdown of skin Facility Procedures CPT4 Code: 41638453 Description: 11042 - DEB SUBQ TISSUE 20 SQ CM/< ICD-10 Diagnosis Description I89.0 Lymphedema, not elsewhere classified S81.812A Laceration without foreign body, left lower leg, initial enc L97.222 Non-pressure chronic ulcer of left calf with fat layer  expos L97.221 Non-pressure chronic ulcer of left calf limited to breakdown Modifier: ounter ed of  skin Quantity: 1 Physician Procedures CPT4 Code: 6468032 Description: 12248 - WC PHYS SUBQ TISS 20 SQ CM ICD-10 Diagnosis Description I89.0 Lymphedema, not elsewhere classified S81.812A Laceration without foreign body, left lower leg, initial enc L97.222 Non-pressure chronic ulcer of left calf with fat layer  expos L97.221 Non-pressure chronic ulcer of left calf limited to breakdown Modifier: ounter ed of skin Quantity: 1 Electronic Signature(s) Signed: 01/06/2017 10:50:34 AM By: Christin Fudge MD, FACS Entered By: Christin Fudge on 01/06/2017 10:50:34

## 2017-01-07 NOTE — Progress Notes (Signed)
NIKE, SOUTHWELL ([id number]) Visit Report for 01/06/2017 Arrival Information Details Patient Name: [name], [name]. Date of Service: 01/06/2017 10:15 AM Medical Record Number: [id number] Patient Account Number: [id number] Date of Birth/Sex: Sep 27, 1930 (83 y.o. Male) Treating RN: Roger Shelter Primary Care Dona Walby: Gaynelle Arabian Other Clinician: Referring Cardell Rachel: Gaynelle Arabian Treating Noreta Kue/Extender: Frann Rider in Treatment: 2 Visit Information History Since Last Visit All ordered tests and consults were completed: No Patient Arrived: Ambulatory Added or deleted any medications: No Arrival Time: 10:08 Any new allergies or adverse reactions: No Accompanied By: wife Had a fall or experienced change in No Transfer Assistance: None activities of daily living that may affect Patient Requires Transmission-Based No risk of falls: Precautions: Signs or symptoms of abuse/neglect since last visito No Patient Has Alerts: No Hospitalized since last visit: No Pain Present Now: Yes Electronic Signature(s) Signed: 01/06/2017 2:36:38 PM By: Roger Shelter Entered By: Roger Shelter on 01/06/2017 10:08:56 [name] ([id number]) -------------------------------------------------------------------------------- Encounter Discharge Information Details Patient Name: [name] Date of Service: 01/06/2017 10:15 AM Medical Record Number: [id number] Patient Account Number: [id number] Date of Birth/Sex: 1930/06/15 (83 y.o. Male) Treating RN: Roger Shelter Primary Care Ivy Puryear: Gaynelle Arabian Other Clinician: Referring Luisfernando Brightwell: Gaynelle Arabian Treating Austine Kelsay/Extender: Frann Rider in Treatment: 2 Encounter Discharge Information Items Discharge Pain Level: 0 Discharge Condition: Stable Ambulatory Status: Ambulatory Discharge Destination: Home Transportation: Private Auto Accompanied By: wife Schedule Follow-up Appointment: Yes Medication  Reconciliation completed and No provided to Patient/Care Logyn Dedominicis: Patient Clinical Summary of Care: Declined Electronic Signature(s) Signed: 01/06/2017 2:36:38 PM By: Roger Shelter Entered By: Roger Shelter on 01/06/2017 11:38:57 [name] ([id number]) -------------------------------------------------------------------------------- Lower Extremity Assessment Details Patient Name: [name] Date of Service: 01/06/2017 10:15 AM Medical Record Number: [id number] Patient Account Number: [id number] Date of Birth/Sex: [birth year] (83 y.o. Male) Treating RN: Roger Shelter Primary Care Herold Salguero: Gaynelle Arabian Other Clinician: Referring Retia Cordle: Gaynelle Arabian Treating Tynesha Free/Extender: Frann Rider in Treatment: 2 Edema Assessment Assessed: [Left: No] [Right: No] Edema: [Left: Ye] [Right: s] Calf Left: Right: Point of Measurement: 34.5 cm From Medial Instep 37 cm cm Ankle Left: Right: Point of Measurement: 13 cm From Medial Instep 25.5 cm cm Vascular Assessment Claudication: Claudication Assessment [Left:None] Pulses: Dorsalis Pedis Palpable: [Left:Yes] Posterior Tibial Extremity colors, hair growth, and conditions: Extremity Color: [Left:Hyperpigmented] Hair Growth on Extremity: [Left:No] Temperature of Extremity: [Left:Cool] Capillary Refill: [Left:> 3 seconds] Toe Nail Assessment Left: Right: Thick: Yes Discolored: Yes Deformed: Yes Improper Length and Hygiene: Yes Electronic Signature(s) Signed: 01/06/2017 2:36:38 PM By: Roger Shelter Entered By: Roger Shelter on 01/06/2017 10:22:03 [name] ([id number]) -------------------------------------------------------------------------------- Multi Wound Chart Details Patient Name: [name] Date of Service: 01/06/2017 10:15 AM Medical Record Number: [id number] Patient Account Number: [id number] Date of Birth/Sex: 1930-09-12 (83 y.o. Male) Treating RN: Roger Shelter Primary Care Gerasimos Plotts: Gaynelle Arabian Other Clinician: Referring Krithik Mapel: Gaynelle Arabian Treating Latesia Norrington/Extender: Frann Rider in Treatment: 2 Vital Signs Height(in): 72 Pulse(bpm): 84 Weight(lbs): 217 Blood Pressure(mmHg): 160/74 Body Mass Index(BMI): 29 Temperature(F): 97.9 Respiratory Rate (breaths/min): Photos: [1:No Photos] [2:No Photos] [3:No Photos] Wound Location: [1:Left Lower Leg - Midline, Posterior] [2:Left Lower Leg - Medial, Anterior] [3:Lower Leg - Medial, Distal] Wounding Event: [1:Gradually Appeared] [2:Gradually Appeared] [3:Gradually Appeared] Primary Etiology: [1:Venous Leg Ulcer] [2:Venous Leg Ulcer] [3:Pressure Ulcer] Comorbid History: [1:Arrhythmia] [2:Arrhythmia] [3:Arrhythmia] Date Acquired: [1:12/01/2016] [2:12/08/2016] [3:12/27/2016] Weeks of Treatment: [1:2] [2:2] [3:1] Wound Status: [1:Open] [2:Open] [3:Open] Measurements L x W x D [1:1.3x2x0.2] [2:0.2x0.2x0.1] [3:0.1x0.1x0.1] (cm) Area (cm) : [  1:2.042] [2:0.031] [3:0.008] Volume (cm) : [1:0.408] [2:0.003] [3:0.001] % Reduction in Area: [1:-73.30%] [2:98.80%] [3:95.70%] % Reduction in Volume: [1:-245.80%] [2:98.80%] [3:94.70%] Classification: [1:Partial Thickness] [2:Partial Thickness] [3:Category/Stage II] Exudate Amount: [1:Large] [2:None Present] [3:None Present] Exudate Type: [1:Serous] [2:N/A] [3:N/A] Exudate Color: [1:amber] [2:N/A] [3:N/A] Wound Margin: [1:Flat and Intact] [2:Flat and Intact] [3:Flat and Intact] Granulation Amount: [1:None Present (0%)] [2:None Present (0%)] [3:None Present (0%)] Necrotic Amount: [1:Large (67-100%)] [2:Large (67-100%)] [3:Large (67-100%)] Necrotic Tissue: [1:Adherent Slough] [2:Eschar] [3:Eschar] Exposed Structures: [1:Fat Layer (Subcutaneous Tissue) Exposed: Yes Fascia: No Tendon: No Muscle: No Joint: No Bone: No] [2:Fascia: No Fat Layer (Subcutaneous Tissue) Exposed: No Tendon: No Muscle: No Joint: No Bone: No] [3:Fascia: No Fat  Layer (Subcutaneous Tissue)  Exposed: No Tendon: No Muscle: No Joint: No Bone: No] Epithelialization: [1:Small (1-33%)] [2:Large (67-100%)] [3:Large (67-100%)] Periwound Skin Texture: [1:Induration: Yes Excoriation: No Callus: No Crepitus: No Rash: No Scarring: No] [2:Excoriation: No Induration: No Callus: No Crepitus: No Rash: No Scarring: No] [3:Excoriation: No Induration: No Callus: No Crepitus: No Rash: No Scarring: No] Periwound Skin Moisture: Maceration: No Dry/Scaly: Yes Maceration: No Dry/Scaly: No Maceration: No Dry/Scaly: No Periwound Skin Color: Erythema: Yes Erythema: Yes Atrophie Blanche: No Atrophie Blanche: No Atrophie Blanche: No Cyanosis: No Cyanosis: No Cyanosis: No Ecchymosis: No Ecchymosis: No Ecchymosis: No Erythema: No Hemosiderin Staining: No Hemosiderin Staining: No Hemosiderin Staining: No Mottled: No Mottled: No Mottled: No Pallor: No Pallor: No Pallor: No Rubor: No Rubor: No Rubor: No Erythema Location: Circumferential Circumferential N/A Temperature: No Abnormality N/A No Abnormality Tenderness on Palpation: Yes No No Wound Preparation: Ulcer Cleansing: Ulcer Cleansing: Ulcer Cleansing: Rinsed/Irrigated with Saline Rinsed/Irrigated with Saline Rinsed/Irrigated with Saline Topical Anesthetic Applied: Topical Anesthetic Applied: Topical Anesthetic Applied: Other: lidodcaine 4% Other: lidocaine 4% Other: lidocaine 4% Treatment Notes Electronic Signature(s) Signed: 01/06/2017 10:45:38 AM By: Christin Fudge MD, FACS Entered By: Christin Fudge on 01/06/2017 10:45:38 [name] ([id number]) -------------------------------------------------------------------------------- Pain Assessment Details Patient Name: [name] Date of Service: 01/06/2017 10:15 AM Medical Record Number: [id number] Patient Account Number: [id number] Date of Birth/Sex: 20-Dec-1930 (83 y.o. Male) Treating RN: Roger Shelter Primary Care Nirvi Boehler: Gaynelle Arabian Other Clinician: Referring Christia Coaxum: Gaynelle Arabian Treating Lashawne Dura/Extender: Frann Rider in Treatment: 2 Active Problems Location of Pain Severity and Description of Pain Patient Has Paino Yes Site Locations Pain Location: Pain in Ulcers Duration of the Pain. Constant / Intermittento Intermittent Rate the pain. Current Pain Level: 2 Pain Management and Medication Current Pain Management: Electronic Signature(s) Signed: 01/06/2017 2:36:38 PM By: Roger Shelter Entered By: Roger Shelter on 01/06/2017 10:09:19 [name] ([id number]) -------------------------------------------------------------------------------- Patient/Caregiver Education Details Patient Name: [name] Date of Service: 01/06/2017 10:15 AM Medical Record Number: [id number] Patient Account Number: [id number] Date of Birth/Gender: 1930-03-27 (83 y.o. Male) Treating RN: Roger Shelter Primary Care Physician: Gaynelle Arabian Other Clinician: Referring Physician: Gaynelle Arabian Treating Physician/Extender: Frann Rider in Treatment: 2 Education Assessment Education Provided To: Patient and Caregiver Education Topics Provided Wound Debridement: Handouts: Wound Debridement Methods: Explain/Verbal Responses: State content correctly Wound/Skin Impairment: Handouts: Caring for Your Ulcer Methods: Explain/Verbal Responses: State content correctly Electronic Signature(s) Signed: 01/06/2017 2:36:38 PM By: Roger Shelter Entered By: Roger Shelter on 01/06/2017 11:39:15 [name] ([id number]) -------------------------------------------------------------------------------- Wound Assessment Details Patient Name: [name] Date of Service: 01/06/2017 10:15 AM Medical Record Number: [id number] Patient Account Number: [id number] Date of Birth/Sex: [birth year] (83 y.o. Male) Treating RN: Roger Shelter Primary Care Amairani Shuey: Gaynelle Arabian Other  Clinician: Referring Giovanie Lefebre: Gaynelle Arabian  Treating Mikiya Nebergall/Extender: Frann Rider in Treatment: 2 Wound Status Wound Number: 1 Primary Etiology: Venous Leg Ulcer Wound Location: Left Lower Leg - Midline, Posterior Wound Status: Open Wounding Event: Gradually Appeared Comorbid History: Arrhythmia Date Acquired: 12/01/2016 Weeks Of Treatment: 2 Clustered Wound: No Photos Photo Uploaded By: Roger Shelter on 01/06/2017 15:30:34 Wound Measurements Length: (cm) 1.3 Width: (cm) 2 Depth: (cm) 0.2 Area: (cm) 2.042 Volume: (cm) 0.408 % Reduction in Area: -73.3% % Reduction in Volume: -245.8% Epithelialization: Small (1-33%) Tunneling: No Undermining: No Wound Description Classification: Partial Thickness Wound Margin: Flat and Intact Exudate Amount: Large Exudate Type: Serous Exudate Color: amber Foul Odor After Cleansing: No Slough/Fibrino Yes Wound Bed Granulation Amount: None Present (0%) Exposed Structure Necrotic Amount: Large (67-100%) Fascia Exposed: No Necrotic Quality: Adherent Slough Fat Layer (Subcutaneous Tissue) Exposed: Yes Tendon Exposed: No Muscle Exposed: No Joint Exposed: No Bone Exposed: No Periwound Skin Texture Davies, Dequarius J. ([id number]) Texture Color No Abnormalities Noted: No No Abnormalities Noted: No Callus: No Atrophie Blanche: No Crepitus: No Cyanosis: No Excoriation: No Ecchymosis: No Induration: Yes Erythema: Yes Rash: No Erythema Location: Circumferential Scarring: No Hemosiderin Staining: No Mottled: No Moisture Pallor: No No Abnormalities Noted: No Rubor: No Dry / Scaly: No Maceration: No Temperature / Pain Temperature: No Abnormality Tenderness on Palpation: Yes Wound Preparation Ulcer Cleansing: Rinsed/Irrigated with Saline Topical Anesthetic Applied: Other: lidodcaine 4%, Treatment Notes Wound #1 (Left, Midline, Posterior Lower Leg) 1. Cleansed with: Clean wound with Normal Saline 2.  Anesthetic Topical Lidocaine 4% cream to wound bed prior to debridement 4. Dressing Applied: Santyl Ointment 5. Secondary Dressing Applied Bordered Foam Dressing Electronic Signature(s) Signed: 01/06/2017 2:36:38 PM By: Roger Shelter Entered By: Roger Shelter on 01/06/2017 10:18:04 [name] ([id number]) -------------------------------------------------------------------------------- Wound Assessment Details Patient Name: [name] Date of Service: 01/06/2017 10:15 AM Medical Record Number: [id number] Patient Account Number: [id number] Date of Birth/Sex: [birth year] (83 y.o. Male) Treating RN: Roger Shelter Primary Care Javaun Dimperio: Gaynelle Arabian Other Clinician: Referring Daveon Arpino: Gaynelle Arabian Treating Rodgers Likes/Extender: Frann Rider in Treatment: 2 Wound Status Wound Number: 2 Primary Etiology: Venous Leg Ulcer Wound Location: Left Lower Leg - Medial, Anterior Wound Status: Open Wounding Event: Gradually Appeared Comorbid History: Arrhythmia Date Acquired: 12/08/2016 Weeks Of Treatment: 2 Clustered Wound: No Photos Photo Uploaded By: Roger Shelter on 01/06/2017 15:30:34 Wound Measurements Length: (cm) 0.2 Width: (cm) 0.2 Depth: (cm) 0.1 Area: (cm) 0.031 Volume: (cm) 0.003 % Reduction in Area: 98.8% % Reduction in Volume: 98.8% Epithelialization: Large (67-100%) Tunneling: No Undermining: No Wound Description Classification: Partial Thickness Wound Margin: Flat and Intact Exudate Amount: None Present Foul Odor After Cleansing: No Slough/Fibrino No Wound Bed Granulation Amount: None Present (0%) Exposed Structure Necrotic Amount: Large (67-100%) Fascia Exposed: No Necrotic Quality: Eschar Fat Layer (Subcutaneous Tissue) Exposed: No Tendon Exposed: No Muscle Exposed: No Joint Exposed: No Bone Exposed: No Periwound Skin Texture Texture Color No Abnormalities Noted: No No Abnormalities Noted: No ZIAD, MAYE.  ([id number]) Callus: No Atrophie Blanche: No Crepitus: No Cyanosis: No Excoriation: No Ecchymosis: No Induration: No Erythema: Yes Rash: No Erythema Location: Circumferential Scarring: No Hemosiderin Staining: No Mottled: No Moisture Pallor: No No Abnormalities Noted: No Rubor: No Dry / Scaly: Yes Maceration: No Wound Preparation Ulcer Cleansing: Rinsed/Irrigated with Saline Topical Anesthetic Applied: Other: lidocaine 4%, Treatment Notes Wound #2 (Left, Medial, Anterior Lower Leg) 1. Cleansed with: Clean wound with Normal Saline 2. Anesthetic Topical Lidocaine 4% cream to wound bed prior to debridement 4. Dressing Applied: Prisma Ag  5. Secondary Dressing Applied Bordered Foam Dressing Electronic Signature(s) Signed: 01/06/2017 2:36:38 PM By: Roger Shelter Entered By: Roger Shelter on 01/06/2017 10:19:14 [name] ([id number]) -------------------------------------------------------------------------------- Wound Assessment Details Patient Name: [name] Date of Service: 01/06/2017 10:15 AM Medical Record Number: [id number] Patient Account Number: [id number] Date of Birth/Sex: Jun 25, 1930 (83 y.o. Male) Treating RN: Roger Shelter Primary Care Sallye Lunz: Gaynelle Arabian Other Clinician: Referring Delois Tolbert: Gaynelle Arabian Treating Avyon Herendeen/Extender: Frann Rider in Treatment: 2 Wound Status Wound Number: 3 Primary Etiology: Pressure Ulcer Wound Location: Lower Leg - Medial, Distal Wound Status: Open Wounding Event: Gradually Appeared Comorbid History: Arrhythmia Date Acquired: 12/27/2016 Weeks Of Treatment: 1 Clustered Wound: No Photos Photo Uploaded By: Roger Shelter on 01/06/2017 15:30:58 Wound Measurements Length: (cm) 0.1 Width: (cm) 0.1 Depth: (cm) 0.1 Area: (cm) 0.008 Volume: (cm) 0.001 % Reduction in Area: 95.7% % Reduction in Volume: 94.7% Epithelialization: Large (67-100%) Tunneling: No Undermining: No Wound  Description Classification: Category/Stage II Wound Margin: Flat and Intact Exudate Amount: None Present Foul Odor After Cleansing: No Slough/Fibrino No Wound Bed Granulation Amount: None Present (0%) Exposed Structure Necrotic Amount: Large (67-100%) Fascia Exposed: No Necrotic Quality: Eschar Fat Layer (Subcutaneous Tissue) Exposed: No Tendon Exposed: No Muscle Exposed: No Joint Exposed: No Bone Exposed: No Periwound Skin Texture Texture Color No Abnormalities Noted: No No Abnormalities Noted: No THEORDORE, CISNERO. ([id number]) Callus: No Atrophie Blanche: No Crepitus: No Cyanosis: No Excoriation: No Ecchymosis: No Induration: No Erythema: No Rash: No Hemosiderin Staining: No Scarring: No Mottled: No Pallor: No Moisture Rubor: No No Abnormalities Noted: No Dry / Scaly: No Temperature / Pain Maceration: No Temperature: No Abnormality Wound Preparation Ulcer Cleansing: Rinsed/Irrigated with Saline Topical Anesthetic Applied: Other: lidocaine 4%, Treatment Notes Wound #3 (Distal, Medial Lower Leg) 1. Cleansed with: Clean wound with Normal Saline 2. Anesthetic Topical Lidocaine 4% cream to wound bed prior to debridement 4. Dressing Applied: Prisma Ag 5. Secondary Dressing Applied Bordered Foam Dressing Electronic Signature(s) Signed: 01/06/2017 2:36:38 PM By: Roger Shelter Entered By: Roger Shelter on 01/06/2017 10:20:12 [name] ([id number]) -------------------------------------------------------------------------------- [place] Details Patient Name: [name] Date of Service: 01/06/2017 10:15 AM Medical Record Number: [id number] Patient Account Number: [id number] Date of Birth/Sex: 10-14-1930 (83 y.o. Male) Treating RN: Roger Shelter Primary Care Idolina Mantell: Gaynelle Arabian Other Clinician: Referring Jaira Canady: Gaynelle Arabian Treating Javionna Leder/Extender: Frann Rider in Treatment: 2 Vital Signs Time Taken: 10:09 Temperature  (F): 97.9 Height (in): 72 Pulse (bpm): 84 Weight (lbs): 217 Blood Pressure (mmHg): 160/74 Body Mass Index (BMI): 29.4 Reference Range: 80 - 120 mg / dl Electronic Signature(s) Signed: 01/06/2017 2:36:38 PM By: Roger Shelter Entered By: Roger Shelter on 01/06/2017 10:10:05

## 2017-01-13 ENCOUNTER — Encounter: Payer: Medicare Other | Attending: Physician Assistant | Admitting: Physician Assistant

## 2017-01-13 DIAGNOSIS — Z87891 Personal history of nicotine dependence: Secondary | ICD-10-CM | POA: Diagnosis not present

## 2017-01-13 DIAGNOSIS — L97221 Non-pressure chronic ulcer of left calf limited to breakdown of skin: Secondary | ICD-10-CM | POA: Insufficient documentation

## 2017-01-13 DIAGNOSIS — S81812A Laceration without foreign body, left lower leg, initial encounter: Secondary | ICD-10-CM | POA: Diagnosis not present

## 2017-01-13 DIAGNOSIS — I480 Paroxysmal atrial fibrillation: Secondary | ICD-10-CM | POA: Insufficient documentation

## 2017-01-13 DIAGNOSIS — L97222 Non-pressure chronic ulcer of left calf with fat layer exposed: Secondary | ICD-10-CM | POA: Diagnosis not present

## 2017-01-13 DIAGNOSIS — X58XXXA Exposure to other specified factors, initial encounter: Secondary | ICD-10-CM | POA: Insufficient documentation

## 2017-01-13 DIAGNOSIS — Z96641 Presence of right artificial hip joint: Secondary | ICD-10-CM | POA: Diagnosis not present

## 2017-01-13 DIAGNOSIS — I10 Essential (primary) hypertension: Secondary | ICD-10-CM | POA: Insufficient documentation

## 2017-01-13 DIAGNOSIS — Z9049 Acquired absence of other specified parts of digestive tract: Secondary | ICD-10-CM | POA: Insufficient documentation

## 2017-01-13 DIAGNOSIS — I890 Lymphedema, not elsewhere classified: Secondary | ICD-10-CM | POA: Diagnosis not present

## 2017-01-14 NOTE — Progress Notes (Signed)
DETRIC, SCALISI ([id number]) Visit Report for 01/13/2017 Arrival Information Details Patient Name: [name], [name]. Date of Service: 01/13/2017 11:00 AM Medical Record Number: [id number] Patient Account Number: [id number] Date of Birth/Sex: 03-27-1930 (84 y.o. Male) Treating RN: Cornell Barman Primary Care Clancy Mullarkey: Gaynelle Arabian Other Clinician: Referring Franko Hilliker: Gaynelle Arabian Treating Alanny Rivers/Extender: Melburn Hake, HOYT Weeks in Treatment: 3 Visit Information History Since Last Visit Added or deleted any medications: No Patient Arrived: Ambulatory Any new allergies or adverse reactions: No Arrival Time: 10:47 Had a fall or experienced change in No Accompanied By: wife, Rod Holler activities of daily living that may affect Ann risk of falls: Transfer Assistance: None Signs or symptoms of abuse/neglect since last visito No Patient Identification Verified: Yes Hospitalized since last visit: No Secondary Verification Process Completed: Yes Has Dressing in Place as Prescribed: Yes Patient Requires Transmission-Based No Pain Present Now: No Precautions: Patient Has Alerts: No Electronic Signature(s) Signed: 01/13/2017 5:00:55 PM By: Gretta Cool, BSN, RN, CWS, Kim RN, BSN Entered By: Gretta Cool, BSN, RN, CWS, Kim on 01/13/2017 10:50:47 [name] ([id number]) -------------------------------------------------------------------------------- Encounter Discharge Information Details Patient Name: [name], [name]. Date of Service: 01/13/2017 11:00 AM Medical Record Number: [id number] Patient Account Number: [id number] Date of Birth/Sex: 09-05-1930 (84 y.o. Male) Treating RN: Roger Shelter Primary Care Sahan Pen: Gaynelle Arabian Other Clinician: Referring Rudell Marlowe: Gaynelle Arabian Treating Xan Ingraham/Extender: Melburn Hake, HOYT Weeks in Treatment: 3 Encounter Discharge Information Items Discharge Pain Level: 0 Discharge Condition: Stable Ambulatory Status: Ambulatory Discharge Destination:  Home Transportation: Private Auto Accompanied By: wife Schedule Follow-up Appointment: Yes Medication Reconciliation completed and Yes provided to Patient/Care Aleah Ahlgrim: Patient Clinical Summary of Care: Declined Electronic Signature(s) Signed: 01/13/2017 1:14:48 PM By: Gretta Cool, BSN, RN, CWS, Kim RN, BSN Entered By: Gretta Cool, BSN, RN, CWS, Kim on 01/13/2017 13:14:47 [name] ([id number]) -------------------------------------------------------------------------------- Lower Extremity Assessment Details Patient Name: [name], [name]. Date of Service: 01/13/2017 11:00 AM Medical Record Number: [id number] Patient Account Number: [id number] Date of Birth/Sex: 1930-10-29 (84 y.o. Male) Treating RN: Cornell Barman Primary Care Arianny Pun: Gaynelle Arabian Other Clinician: Referring Loni Abdon: Gaynelle Arabian Treating Marlenne Ridge/Extender: Melburn Hake, HOYT Weeks in Treatment: 3 Edema Assessment Assessed: [Left: No] [Right: No] [Left: Edema] [Right: :] Calf Left: Right: Point of Measurement: 34.5 cm From Medial Instep 37.8 cm cm Ankle Left: Right: Point of Measurement: 13 cm From Medial Instep 24 cm cm Vascular Assessment Pulses: Dorsalis Pedis Palpable: [Left:Yes] Posterior Tibial Extremity colors, hair growth, and conditions: Extremity Color: [Left:Hyperpigmented] Hair Growth on Extremity: [Left:No] Temperature of Extremity: [Left:Warm] Capillary Refill: [Left:> 3 seconds] Toe Nail Assessment Left: Right: Thick: Yes Discolored: Yes Deformed: Yes Improper Length and Hygiene: Yes Electronic Signature(s) Signed: 01/13/2017 5:00:55 PM By: Gretta Cool, BSN, RN, CWS, Kim RN, BSN Entered By: Gretta Cool, BSN, RN, CWS, Kim on 01/13/2017 11:01:59 [name] ([id number]) -------------------------------------------------------------------------------- Multi Wound Chart Details Patient Name: [name] Date of Service: 01/13/2017 11:00 AM Medical Record Number: [id number] Patient Account Number:  [id number] Date of Birth/Sex: 1930-11-11 (84 y.o. Male) Treating RN: Cornell Barman Primary Care Saif Peter: Gaynelle Arabian Other Clinician: Referring Gavan Nordby: Gaynelle Arabian Treating [place]/Extender: Melburn Hake, HOYT Weeks in Treatment: 3 Vital Signs Height(in): 72 Pulse(bpm): 87 Weight(lbs): 217 Blood Pressure(mmHg): 131/72 Body Mass Index(BMI): 29 Temperature(F): 97.7 Respiratory Rate 16 (breaths/min): Photos: Wound Location: Left, Midline, Posterior Lower Left, Medial, Anterior Lower Distal, Medial Lower Leg Leg Leg Wounding Event: Gradually Appeared Gradually Appeared Gradually Appeared Primary Etiology: Venous Leg Ulcer Venous Leg Ulcer Pressure Ulcer Date Acquired: 12/01/2016 12/08/2016 12/27/2016 Weeks of Treatment: 3  3 2 Wound Status: Open Healed - Epithelialized Healed - Epithelialized Measurements L x W x D 1.8x2.1x0.2 0x0x0 0x0x0 (cm) Area (cm) : 2.969 0 0 Volume (cm) : 0.594 0 0 % Reduction in Area: -152.00% 100.00% 100.00% % Reduction in Volume: -403.40% 100.00% 100.00% Classification: Partial Thickness Partial Thickness Category/Stage II Debridement: Debridement ([id number]) N/A N/A Pre-procedure 11:12 N/A N/A Verification/Time Out Taken: Pain Control: Lidocaine 4% Topical Solution N/A N/A Tissue Debrided: Fibrin/Slough, Subcutaneous N/A N/A Level: Skin/Subcutaneous Tissue N/A N/A Debridement Area (sq cm): 3.78 N/A N/A Instrument: Curette N/A N/A Bleeding: Minimum N/A N/A Hemostasis Achieved: Pressure N/A N/A Procedural Pain: 2 N/A N/A Post Procedural Pain: 0 N/A N/A Debridement Treatment Procedure was tolerated well N/A N/A Response: Post Debridement 1.8x2.1x0.4 N/A N/A Measurements L x W x D (cm) [name], [name] ([id number]) Post Debridement Volume: 1.188 N/A N/A (cm) Periwound Skin Texture: No Abnormalities Noted No Abnormalities Noted No Abnormalities Noted Periwound Skin Moisture: No Abnormalities Noted No Abnormalities Noted No  Abnormalities Noted Periwound Skin Color: No Abnormalities Noted No Abnormalities Noted No Abnormalities Noted Tenderness on Palpation: No No No Procedures Performed: Debridement N/A N/A Treatment Notes Electronic Signature(s) Signed: 01/13/2017 1:13:55 PM By: Gretta Cool, BSN, RN, CWS, Kim RN, BSN Entered By: Gretta Cool, BSN, RN, CWS, Kim on 01/13/2017 13:13:54 [name] ([id number]) -------------------------------------------------------------------------------- [place] Details Patient Name: [name], [name]. Date of Service: 01/13/2017 11:00 AM Medical Record Number: [id number] Patient Account Number: [id number] Date of Birth/Sex: Nov 23, 1930 (84 y.o. Male) Treating RN: Cornell Barman Primary Care Jaydynn Wolford: Gaynelle Arabian Other Clinician: Referring Shalona Harbour: Gaynelle Arabian Treating Pawel Soules/Extender: Melburn Hake, HOYT Weeks in Treatment: 3 Active Inactive ` Orientation to the Wound Care Program Nursing Diagnoses: Knowledge deficit related to the wound healing center program Goals: Patient/caregiver will verbalize understanding of the [place] Program Date Initiated: 12/22/2016 Target Resolution Date: 01/12/2017 Goal Status: Active Interventions: Provide education on orientation to the wound center Notes: ` Wound/Skin Impairment Nursing Diagnoses: Impaired tissue integrity Knowledge deficit related to ulceration/compromised skin integrity Goals: Patient/caregiver will verbalize understanding of skin care regimen Date Initiated: 12/22/2016 Target Resolution Date: 01/12/2017 Goal Status: Active Ulcer/skin breakdown will have a volume reduction of 30% by week 4 Date Initiated: 12/22/2016 Target Resolution Date: 01/12/2017 Goal Status: Active Interventions: Assess patient/caregiver ability to obtain necessary supplies Assess ulceration(s) every visit Treatment Activities: Skin care regimen initiated : 12/22/2016 Notes: Electronic Signature(s) Signed:  01/13/2017 5:00:55 PM By: Gretta Cool, BSN, RN, CWS, Kim RN, BSN [place], Jeoffrey Massed ([id number]) Entered By: Gretta Cool, BSN, RN, CWS, Kim on 01/13/2017 11:03:45 [name] ([id number]) -------------------------------------------------------------------------------- Pain Assessment Details Patient Name: [name], [name]. Date of Service: 01/13/2017 11:00 AM Medical Record Number: [id number] Patient Account Number: [id number] Date of Birth/Sex: [birth year] (84 y.o. Male) Treating RN: Cornell Barman Primary Care Anaysha Andre: Gaynelle Arabian Other Clinician: Referring Ja Pistole: Gaynelle Arabian Treating Ariah Mower/Extender: Melburn Hake, HOYT Weeks in Treatment: 3 Active Problems Location of Pain Severity and Description of Pain Patient Has Paino No Site Locations With Dressing Change: No Character of Pain Describe the Pain: Tender Pain Management and Medication Current Pain Management: Goals for Pain Management Topical or injectable lidocaine is offered to patient for acute pain when surgical debridement is performed. If needed, Patient is instructed to use over the counter pain medication for the following 24-48 hours after debridement. Wound care MDs do not prescribed pain medications. Patient has chronic pain or uncontrolled pain. Patient has been instructed to make an appointment with their Primary Care Physician for pain management.  Electronic Signature(s) Signed: 01/13/2017 5:00:55 PM By: Gretta Cool, BSN, RN, CWS, Kim RN, BSN Entered By: Gretta Cool, BSN, RN, CWS, Kim on 01/13/2017 10:51:05 [name] ([id number]) -------------------------------------------------------------------------------- Patient/Caregiver Education Details Patient Name: [name], [name] Date of Service: 01/13/2017 11:00 AM Medical Record Number: [id number] Patient Account Number: [id number] Date of Birth/Gender: 12-Oct-1930 (84 y.o. Male) Treating RN: Cornell Barman Primary Care Physician: Gaynelle Arabian Other Clinician: Referring Physician:  Gaynelle Arabian Treating Physician/Extender: Sharalyn Ink in Treatment: 3 Education Assessment Education Provided To: Patient Education Topics Provided Wound/Skin Impairment: Handouts: Caring for Your Ulcer, Other: wound care as prescribed Methods: Demonstration Responses: State content correctly Electronic Signature(s) Signed: 01/13/2017 5:00:55 PM By: Gretta Cool, BSN, RN, CWS, Kim RN, BSN Entered By: Gretta Cool, BSN, RN, CWS, Kim on 01/13/2017 13:15:15 [name] ([id number]) -------------------------------------------------------------------------------- Wound Assessment Details Patient Name: [name], [name]. Date of Service: 01/13/2017 11:00 AM Medical Record Number: [id number] Patient Account Number: [id number] Date of Birth/Sex: 11/05/1930 (84 y.o. Male) Treating RN: Cornell Barman Primary Care Ritesh Opara: Gaynelle Arabian Other Clinician: Referring Gerell Fortson: Gaynelle Arabian Treating Jair Lindblad/Extender: Melburn Hake, HOYT Weeks in Treatment: 3 Wound Status Wound Number: 1 Primary Etiology: Venous Leg Ulcer Wound Location: Left, Midline, Posterior Lower Leg Wound Status: Open Wounding Event: Gradually Appeared Date Acquired: 12/01/2016 Weeks Of Treatment: 3 Clustered Wound: No Photos Photo Uploaded By: Gretta Cool, BSN, RN, CWS, Kim on 01/13/2017 11:16:27 Wound Measurements Length: (cm) 1.8 Width: (cm) 2.1 Depth: (cm) 0.2 Area: (cm) 2.969 Volume: (cm) 0.594 % Reduction in Area: -152% % Reduction in Volume: -403.4% Wound Description Classification: Partial Thickness Periwound Skin Texture Texture Color No Abnormalities Noted: No No Abnormalities Noted: No Moisture No Abnormalities Noted: No Treatment Notes Wound #1 (Left, Midline, Posterior Lower Leg) 1. Cleansed with: Clean wound with Normal Saline 2. Anesthetic Topical Lidocaine 4% cream to wound bed prior to debridement 4. Dressing Applied: Santyl Ointment 5. Secondary Dressing Applied Bordered Foam  Dressing EL, PILE ([id number]) Electronic Signature(s) Signed: 01/13/2017 5:00:55 PM By: Gretta Cool, BSN, RN, CWS, Kim RN, BSN Entered By: Gretta Cool, BSN, RN, CWS, Kim on 01/13/2017 10:59:05 [name] ([id number]) -------------------------------------------------------------------------------- Wound Assessment Details Patient Name: [name], [name]. Date of Service: 01/13/2017 11:00 AM Medical Record Number: [id number] Patient Account Number: [id number] Date of Birth/Sex: 05/16/1930 (84 y.o. Male) Treating RN: Cornell Barman Primary Care Giovannie Scerbo: Gaynelle Arabian Other Clinician: Referring Promise Weldin: Gaynelle Arabian Treating Earleen Aoun/Extender: Melburn Hake, HOYT Weeks in Treatment: 3 Wound Status Wound Number: 2 Primary Etiology: Venous Leg Ulcer Wound Location: Left, Medial, Anterior Lower Leg Wound Status: Healed - Epithelialized Wounding Event: Gradually Appeared Date Acquired: 12/08/2016 Weeks Of Treatment: 3 Clustered Wound: No Photos Photo Uploaded By: Gretta Cool, BSN, RN, CWS, Kim on 01/13/2017 11:16:27 Wound Measurements Length: (cm) 0 % Re Width: (cm) 0 % Re Depth: (cm) 0 Area: (cm) 0 Volume: (cm) 0 duction in Area: 100% duction in Volume: 100% Wound Description Classification: Partial Thickness Periwound Skin Texture Texture Color No Abnormalities Noted: No No Abnormalities Noted: No Moisture No Abnormalities Noted: No Electronic Signature(s) Signed: 01/13/2017 5:00:55 PM By: Gretta Cool, BSN, RN, CWS, Kim RN, BSN Entered By: Gretta Cool, BSN, RN, CWS, Kim on 01/13/2017 10:59:06 [name] ([id number]) -------------------------------------------------------------------------------- Wound Assessment Details Patient Name: [name] Date of Service: 01/13/2017 11:00 AM Medical Record Number: [id number] Patient Account Number: [id number] Date of Birth/Sex: Dec 18, 1930 (84 y.o. Male) Treating RN: Cornell Barman Primary Care Zala Degrasse: Gaynelle Arabian Other Clinician: Referring  Renne Platts: Gaynelle Arabian Treating Lessly Stigler/Extender: STONE III, HOYT Weeks in Treatment: 3 Wound  Status Wound Number: 3 Primary Etiology: Pressure Ulcer Wound Location: Distal, Medial Lower Leg Wound Status: Healed - Epithelialized Wounding Event: Gradually Appeared Date Acquired: 12/27/2016 Weeks Of Treatment: 2 Clustered Wound: No Photos Photo Uploaded By: Gretta Cool, BSN, RN, CWS, Kim on 01/13/2017 11:16:54 Wound Measurements Length: (cm) 0 Width: (cm) 0 Depth: (cm) 0 Area: (cm) 0 Volume: (cm) 0 % Reduction in Area: 100% % Reduction in Volume: 100% Wound Description Classification: Category/Stage II Periwound Skin Texture Texture Color No Abnormalities Noted: No No Abnormalities Noted: No Moisture No Abnormalities Noted: No Electronic Signature(s) Signed: 01/13/2017 5:00:55 PM By: Gretta Cool, BSN, RN, CWS, Kim RN, BSN Entered By: Gretta Cool, BSN, RN, CWS, Kim on 01/13/2017 10:59:06 [name] ([id number]) -------------------------------------------------------------------------------- [place] Details Patient Name: [name] Date of Service: 01/13/2017 11:00 AM Medical Record Number: [id number] Patient Account Number: [id number] Date of Birth/Sex: [birth year] (84 y.o. Male) Treating RN: Cornell Barman Primary Care Selwyn Reason: Gaynelle Arabian Other Clinician: Referring Missi Mcmackin: Gaynelle Arabian Treating Samanthia Howland/Extender: Melburn Hake, HOYT Weeks in Treatment: 3 Vital Signs Time Taken: 10:51 Temperature (F): 97.7 Height (in): 72 Pulse (bpm): 87 Weight (lbs): 217 Respiratory Rate (breaths/min): 16 Body Mass Index (BMI): 29.4 Blood Pressure (mmHg): 131/72 Reference Range: 80 - 120 mg / dl Electronic Signature(s) Signed: 01/13/2017 5:00:55 PM By: Gretta Cool, BSN, RN, CWS, Kim RN, BSN Entered By: Gretta Cool, BSN, RN, CWS, Kim on 01/13/2017 10:54:06

## 2017-01-14 NOTE — Progress Notes (Signed)
[name], [name] ([id number]) Visit Report for 01/13/2017 Chief Complaint Document Details Patient Name: NAYAN, PROCH. Date of Service: 01/13/2017 11:00 AM Medical Record Number: [id number] Patient Account Number: [id number] Date of Birth/Sex: 1931-01-02 (84 y.o. Male) Treating RN: Roger Shelter Primary Care Provider: Gaynelle Arabian Other Clinician: Referring Provider: Gaynelle Arabian Treating Provider/Extender: Melburn Hake, Sheyenne Konz Weeks in Treatment: 3 Information Obtained from: Patient Chief Complaint He is seen for follow up evaluation of lle ulcers Electronic Signature(s) Signed: 01/13/2017 7:56:36 PM By: Worthy Keeler PA-C Entered By: Worthy Keeler on 01/13/2017 11:06:54 [name] ([id number]) -------------------------------------------------------------------------------- Debridement Details Patient Name: [name] Date of Service: 01/13/2017 11:00 AM Medical Record Number: [id number] Patient Account Number: [id number] Date of Birth/Sex: August 08, 1930 (84 y.o. Male) Treating RN: Cornell Barman Primary Care Provider: Gaynelle Arabian Other Clinician: Referring Provider: Gaynelle Arabian Treating Provider/Extender: Melburn Hake, Nickolaus Bordelon Weeks in Treatment: 3 Debridement Performed for Wound #1 Left,Midline,Posterior Lower Leg Assessment: Performed By: Physician STONE III, Jalacia Mattila E., PA-C Debridement: Debridement Severity of Tissue Pre Fat layer exposed Debridement: Pre-procedure Verification/Time Yes - 11:12 Out Taken: Start Time: 11:12 Pain Control: Lidocaine 4% Topical Solution Level: Skin/Subcutaneous Tissue Total Area Debrided (L x W): 1.8 (cm) x 2.1 (cm) = 3.78 (cm) Tissue and other material Viable, Non-Viable, Fibrin/Slough, Subcutaneous debrided: Instrument: Curette Bleeding: Minimum Hemostasis Achieved: Pressure End Time: 11:15 Procedural Pain: 2 Post Procedural Pain: 0 Response to Treatment: Procedure was tolerated well Post Debridement Measurements of Total  Wound Length: (cm) 1.8 Width: (cm) 2.1 Depth: (cm) 0.4 Volume: (cm) 1.188 Character of Wound/Ulcer Post Debridement: Requires Further Debridement Severity of Tissue Post Debridement: Fat layer exposed Post Procedure Diagnosis Same as Pre-procedure Electronic Signature(s) Signed: 01/13/2017 5:00:55 PM By: Gretta Cool, BSN, RN, CWS, Kim RN, BSN Signed: 01/13/2017 7:56:36 PM By: Worthy Keeler PA-C Entered By: Gretta Cool, BSN, RN, CWS, Kim on 01/13/2017 11:15:37 [name] ([id number]) -------------------------------------------------------------------------------- HPI Details Patient Name: [name] Date of Service: 01/13/2017 11:00 AM Medical Record Number: [id number] Patient Account Number: [id number] Date of Birth/Sex: [birth year] (84 y.o. Male) Treating RN: Cornell Barman Primary Care Provider: Gaynelle Arabian Other Clinician: Referring Provider: Gaynelle Arabian Treating Provider/Extender: Melburn Hake, Avilyn Virtue Weeks in Treatment: 3 History of Present Illness Location: left lower extremity swelling with ulceration on the posterior calf and anterior shin Modifying Factors: Other treatment(s) tried include:and seen his PCP who thought there was cellulitis and infection and put him on doxycycline for 2 weeks HPI Description: 84 year old gentleman has been referred to [place] by his PCP Dr. Gaynelle Arabian, for a ulcer on the left posterior lower leg which has been there for about 6 weeks. The patient was thought to have an infection and was put on doxycycline for 14 days and referred to the wound center. He has a past medical history of paroxysmal atrial fibrillation, hypertension, diverticulosis, status post right hip replacement, appendectomy. He has not been a smoker since 7. the anterior shin wound has been more recent about 2 weeks and this was caused probably due to a blunt abrasion. 01/14/16 on evaluation today patient continues to show signs of slough covering the wound bed. He does have the  Santyl and this seems to be aiding in debridement as a lot of this has loosened up. With that being said there is still a quite significant amount of necrotic debris noted in the wound bed. He does have some discomfort but states that this is mainly with cleansing of the wound he has just a dull ache otherwise.  Electronic Signature(s) Signed: 01/13/2017 7:56:36 PM By: Worthy Keeler PA-C Entered By: Worthy Keeler on 01/13/2017 11:46:26 [name] ([id number]) -------------------------------------------------------------------------------- Physical Exam Details Patient Name: [name] Date of Service: 01/13/2017 11:00 AM Medical Record Number: [id number] Patient Account Number: [id number] Date of Birth/Sex: 09/16/1930 (84 y.o. Male) Treating RN: Cornell Barman Primary Care Provider: Gaynelle Arabian Other Clinician: Referring Provider: Gaynelle Arabian Treating Provider/Extender: STONE III, Jandi Swiger Weeks in Treatment: 3 Constitutional Well-nourished and well-hydrated in no acute distress. Respiratory normal breathing without difficulty. Psychiatric this patient is able to make decisions and demonstrates good insight into disease process. Alert and Oriented x 3. pleasant and cooperative. Notes At this point patient does have necrotic slough covering the wound bed currently. This was sharply debride it today with good result I did remove a significant amount of the necrotic debris although there was still a lot left that the Santyl will work on. I explained to patient however the Santyl will be able to work better now that I have removed what's on the surface. He was appreciative. Electronic Signature(s) Signed: 01/13/2017 7:56:36 PM By: Worthy Keeler PA-C Entered By: Worthy Keeler on 01/13/2017 11:47:15 [name] ([id number]) -------------------------------------------------------------------------------- Physician Orders Details Patient Name: [name] Date of Service:  01/13/2017 11:00 AM Medical Record Number: [id number] Patient Account Number: [id number] Date of Birth/Sex: July 27, 1930 (84 y.o. Male) Treating RN: Cornell Barman Primary Care Provider: Gaynelle Arabian Other Clinician: Referring Provider: Gaynelle Arabian Treating Provider/Extender: Melburn Hake, Ruvi Fullenwider Weeks in Treatment: 3 Verbal / Phone Orders: No Diagnosis Coding ICD-10 Coding Code Description I89.0 Lymphedema, not elsewhere classified S81.812A Laceration without foreign body, left lower leg, initial encounter L97.222 Non-pressure chronic ulcer of left calf with fat layer exposed L97.221 Non-pressure chronic ulcer of left calf limited to breakdown of skin Wound Cleansing Wound #1 Left,Midline,Posterior Lower Leg o Clean wound with Normal Saline. Anesthetic (add to Medication List) Wound #1 Left,Midline,Posterior Lower Leg o Topical Lidocaine 4% cream applied to wound bed prior to debridement (In Clinic Only). Primary Wound Dressing Wound #1 Left,Midline,Posterior Lower Leg o Santyl Ointment Secondary Dressing Wound #1 Left,Midline,Posterior Lower Leg o Boardered Foam Dressing Dressing Change Frequency Wound #1 Left,Midline,Posterior Lower Leg o Change dressing every day. Follow-up Appointments o Return Appointment in 1 week. Edema Control Wound #1 Left,Midline,Posterior Lower Leg o Patient to wear own compression stockings Electronic Signature(s) Signed: 01/13/2017 1:14:13 PM By: Gretta Cool, BSN, RN, CWS, Kim RN, BSN Signed: 01/13/2017 7:56:36 PM By: Worthy Keeler PA-C Entered By: Gretta Cool, BSN, RN, CWS, Kim on 01/13/2017 13:14:13 [name] ([id number]) -------------------------------------------------------------------------------- Problem List Details Patient Name: [name], [name] Date of Service: 01/13/2017 11:00 AM Medical Record Number: [id number] Patient Account Number: [id number] Date of Birth/Sex: 1930/02/12 (84 y.o. Male) Treating RN: Roger Shelter Primary Care  Provider: Gaynelle Arabian Other Clinician: Referring Provider: Gaynelle Arabian Treating Provider/Extender: Melburn Hake, Tierria Watson Weeks in Treatment: 3 Active Problems ICD-10 Encounter Code Description Active Date Diagnosis I89.0 Lymphedema, not elsewhere classified 12/22/2016 Yes Y17.494W Laceration without foreign body, left lower leg, initial encounter 12/22/2016 Yes L97.222 Non-pressure chronic ulcer of left calf with fat layer exposed 12/22/2016 Yes L97.221 Non-pressure chronic ulcer of left calf limited to breakdown of skin 12/22/2016 Yes Inactive Problems Resolved Problems Electronic Signature(s) Signed: 01/13/2017 7:56:36 PM By: Worthy Keeler PA-C Entered By: Worthy Keeler on 01/13/2017 11:06:35 [name] ([id number]) -------------------------------------------------------------------------------- Progress Note Details Patient Name: [name] Date of Service: 01/13/2017 11:00 AM Medical Record Number: [id number] Patient  Account Number: [id number] Date of Birth/Sex: 12/22/1930 (84 y.o. Male) Treating RN: Cornell Barman Primary Care Provider: Gaynelle Arabian Other Clinician: Referring Provider: Gaynelle Arabian Treating Provider/Extender: Melburn Hake, Arrow Emmerich Weeks in Treatment: 3 Subjective Chief Complaint Information obtained from Patient He is seen for follow up evaluation of lle ulcers History of Present Illness (HPI) The following HPI elements were documented for the patient's wound: Location: left lower extremity swelling with ulceration on the posterior calf and anterior shin Modifying Factors: Other treatment(s) tried include:and seen his PCP who thought there was cellulitis and infection and put him on doxycycline for 31 weeks 84 year old gentleman has been referred to [place] by his PCP Dr. Gaynelle Arabian, for a ulcer on the left posterior lower leg which has been there for about 6 weeks. The patient was thought to have an infection and was put on doxycycline for 14 days  and referred to the wound center. He has a past medical history of paroxysmal atrial fibrillation, hypertension, diverticulosis, status post right hip replacement, appendectomy. He has not been a smoker since 13. the anterior shin wound has been more recent about 2 weeks and this was caused probably due to a blunt abrasion. 01/14/16 on evaluation today patient continues to show signs of slough covering the wound bed. He does have the Santyl and this seems to be aiding in debridement as a lot of this has loosened up. With that being said there is still a quite significant amount of necrotic debris noted in the wound bed. He does have some discomfort but states that this is mainly with cleansing of the wound he has just a dull ache otherwise. Patient History Information obtained from Patient. Family History Diabetes - Child, Hypertension - Child, No family history of Cancer, Heart Disease, Kidney Disease, Lung Disease, Seizures, Stroke, Thyroid Problems, Tuberculosis. Social History Former smoker - 54 years +, Marital Status - Married, Alcohol Use - Never, Drug Use - No History, Caffeine Use - Rarely. Review of Systems (ROS) Constitutional Symptoms (General Health) Denies complaints or symptoms of Fever, Chills. Respiratory The patient has no complaints or symptoms. Cardiovascular The patient has no complaints or symptoms. Psychiatric The patient has no complaints or symptoms. [name], [name] ([id number]) Objective Constitutional Well-nourished and well-hydrated in no acute distress. Vitals Time Taken: 10:51 AM, Height: 72 in, Weight: 217 lbs, BMI: 29.4, Temperature: 97.7 F, Pulse: 87 bpm, Respiratory Rate: 16 breaths/min, Blood Pressure: 131/72 mmHg. Respiratory normal breathing without difficulty. Psychiatric this patient is able to make decisions and demonstrates good insight into disease process. Alert and Oriented x 3. pleasant and cooperative. General Notes: At this point  patient does have necrotic slough covering the wound bed currently. This was sharply debride it today with good result I did remove a significant amount of the necrotic debris although there was still a lot left that the Santyl will work on. I explained to patient however the Santyl will be able to work better now that I have removed what's on the surface. He was appreciative. Integumentary (Hair, Skin) Wound #1 status is Open. Original cause of wound was Gradually Appeared. The wound is located on the Left,Midline,Posterior Lower Leg. The wound measures 1.8cm length x 2.1cm width x 0.2cm depth; 2.969cm^2 area and 0.594cm^3 volume. Wound #2 status is Healed - Epithelialized. Original cause of wound was Gradually Appeared. The wound is located on the Left,Medial,Anterior Lower Leg. The wound measures 0cm length x 0cm width x 0cm depth; 0cm^2 area and 0cm^3 volume. Wound #3 status is  Healed - Epithelialized. Original cause of wound was Gradually Appeared. The wound is located on the Distal,Medial Lower Leg. The wound measures 0cm length x 0cm width x 0cm depth; 0cm^2 area and 0cm^3 volume. 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 There was a Skin/Subcutaneous Tissue Debridement ([id number]) debridement with total area of 3.78 sq cm performed by STONE III, Tyquavious Gamel E., PA-C. with the following instrument(s): Curette to remove Viable and Non-Viable tissue/material including Fibrin/Slough and Subcutaneous after achieving pain  control using Lidocaine 4% Topical Solution. A time out was conducted at 11:12, prior to the start of the procedure. A Minimum amount of bleeding was controlled with Pressure. The procedure was tolerated well with a pain level of 2 throughout and a pain level of 0 following the procedure. Post Debridement Measurements: 1.8cm length x 2.1cm width x 0.4cm depth; 1.188cm^3 volume. Character of Wound/Ulcer Post Debridement requires further debridement. Severity of Tissue Post Debridement is: Fat layer exposed. Post procedure Diagnosis Wound #1: Same as Pre-Procedure Plan Wound Cleansing: Wound #1 Left,Midline,Posterior Lower Leg: Clean wound with Normal Saline. Anesthetic (add to Medication List): Wound #1 Left,Midline,Posterior Lower Leg: Topical Lidocaine 4% cream applied to wound bed prior to debridement (In Clinic Only). Primary Wound Dressing: Wound #1 Left,Midline,Posterior Lower Leg: Santyl Ointment Secondary Dressing: Wound #1 Left,Midline,Posterior Lower Leg: Boardered Foam Dressing Dressing Change Frequency: Wound #1 Left,Midline,Posterior Lower Leg: Change dressing every day. Follow-up Appointments: Return Appointment in 1 week. Edema Control: Wound #1 Left,Midline,Posterior Lower Leg: Patient to wear own compression stockings We will see patient for reevaluation in one week to see were things stand. In the interim he will continue with the [facility] as I feel that is helpful. He is in agreement with this plan. Please see above for specific wound care orders. We will see patient for re-evaluation in 1 week(s) here in the clinic. If anything worsens or changes patient will contact our office for additional recommendations. Electronic Signature(s) Signed: 01/13/2017 7:56:36 PM By: Worthy Keeler PA-C Entered By: Worthy Keeler on 01/13/2017 16:46:40 [name] ([id number]) -------------------------------------------------------------------------------- ROS/PFSH  Details Patient Name: [name] Date of Service: 01/13/2017 11:00 AM Medical Record Number: [id number] Patient Account Number: [id number] Date of Birth/Sex: [birth year] (84 y.o. Male) Treating RN: Cornell Barman Primary Care Provider: Gaynelle Arabian Other Clinician: Referring Provider: Gaynelle Arabian Treating Provider/Extender: Melburn Hake, Paeton Studer Weeks in Treatment: 3 Information Obtained From Patient Wound History Do you currently have one or more open woundso Yes How many open wounds do you currently haveo 2 Approximately how long have you had your woundso 4 weeks How have you been treating your wound(s) until nowo neosporin Has your wound(s) ever healed and then re-openedo No Have you had any lab work done in the past montho No Have you tested positive for an antibiotic resistant organism (MRSA, VRE)o No Have you tested positive for osteomyelitis (bone infection)o No Have you had any tests for circulation on your legso No Constitutional Symptoms (General Health) Complaints and Symptoms: Negative for: Fever; Chills Eyes Medical History: Negative for: Cataracts; Glaucoma; Optic Neuritis Ear/Nose/Mouth/Throat Medical History: Negative  for: Chronic sinus problems/congestion; Middle ear problems Hematologic/Lymphatic Medical History: Negative for: Anemia; Hemophilia; Human Immunodeficiency Virus; Lymphedema; Sickle Cell Disease Respiratory Complaints and Symptoms: No Complaints or Symptoms Medical History: Negative for: Aspiration; Asthma; Chronic Obstructive Pulmonary Disease (COPD); Pneumothorax; Sleep Apnea; Tuberculosis Cardiovascular Complaints and Symptoms: No Complaints or Symptoms Medical History: Positive for: Arrhythmia - A- Fib Negative for: Angina; Congestive Heart Failure; Coronary Artery Disease; Deep Vein Thrombosis; Hypertension; [name], [name] ([id number]) Hypotension; Myocardial Infarction; Peripheral Arterial Disease; Peripheral Venous Disease; Phlebitis;  Vasculitis Gastrointestinal Medical History: Negative for: Cirrhosis ; Colitis; Crohnos; Hepatitis A; Hepatitis B; Hepatitis C Endocrine Medical History: Negative for: Type I Diabetes; Type II Diabetes Genitourinary Medical History: Negative for: End Stage Renal Disease Immunological Medical History: Negative for: Lupus Erythematosus; Raynaudos Integumentary (Skin) Medical History: Negative for: History of Burn; History of pressure wounds Musculoskeletal Medical History: Negative for: Gout; Rheumatoid Arthritis; Osteoarthritis; Osteomyelitis Neurologic Medical History: Negative for: Dementia; Neuropathy; Quadriplegia; Paraplegia; Seizure Disorder Psychiatric Complaints and Symptoms: No Complaints or Symptoms Medical History: Negative for: Anorexia/bulimia; Confinement Anxiety Immunizations Pneumococcal Vaccine: Received Pneumococcal Vaccination: No Implantable Devices Family and Social History Cancer: No; Diabetes: Yes - Child; Heart Disease: No; Hypertension: Yes - Child; Kidney Disease: No; Lung Disease: No; Seizures: No; Stroke: No; Thyroid Problems: No; Tuberculosis: No; Former smoker - 55 years +; Marital Status - Married; Alcohol Use: Never; Drug Use: No History; Caffeine Use: Rarely; Financial Concerns: No; Food, Clothing or Shelter Needs: No; Support System Lacking: No; Transportation Concerns: No; Advanced Directives: No; Patient does not want information on Advanced Directives; Do not resuscitate: No; Living Will: No; Medical Power of Attorney: No Physician Affirmation [name], [name] ([id number]) I have reviewed and agree with the above information. Electronic Signature(s) Signed: 01/13/2017 5:00:55 PM By: Gretta Cool, BSN, RN, CWS, Kim RN, BSN Signed: 01/13/2017 7:56:36 PM By: Worthy Keeler PA-C Entered By: Worthy Keeler on 01/13/2017 11:46:51 [name] ([id number]) -------------------------------------------------------------------------------- SuperBill  Details Patient Name: [name] Date of Service: 01/13/2017 Medical Record Number: [id number] Patient Account Number: [id number] Date of Birth/Sex: 24-Mar-1930 (84 y.o. Male) Treating RN: Cornell Barman Primary Care Provider: Gaynelle Arabian Other Clinician: Referring Provider: Gaynelle Arabian Treating Provider/Extender: Melburn Hake, Sam Overbeck Weeks in Treatment: 3 Diagnosis Coding ICD-10 Codes Code Description I89.0 Lymphedema, not elsewhere classified S81.812A Laceration without foreign body, left lower leg, initial encounter L97.222 Non-pressure chronic ulcer of left calf with fat layer exposed L97.221 Non-pressure chronic ulcer of left calf limited to breakdown of skin Facility Procedures CPT4 Code: [id number] Description: [place] - DEB SUBQ TISSUE 20 SQ CM/< ICD-10 Diagnosis Description L97.222 Non-pressure chronic ulcer of left calf with fat layer expo Modifier: sed Quantity: 1 Physician Procedures CPT4 Code: 1937902 Description: 40973 - WC PHYS SUBQ TISS 20 SQ CM ICD-10 Diagnosis Description L97.222 Non-pressure chronic ulcer of left calf with fat layer expo Modifier: sed Quantity: 1 Electronic Signature(s) Signed: 01/13/2017 7:56:36 PM By: Worthy Keeler PA-C Entered By: Worthy Keeler on 01/13/2017 11:48:28

## 2017-01-19 ENCOUNTER — Ambulatory Visit (INDEPENDENT_AMBULATORY_CARE_PROVIDER_SITE_OTHER): Payer: Medicare Other | Admitting: *Deleted

## 2017-01-19 DIAGNOSIS — Z5181 Encounter for therapeutic drug level monitoring: Secondary | ICD-10-CM

## 2017-01-19 DIAGNOSIS — I63133 Cerebral infarction due to embolism of bilateral carotid arteries: Secondary | ICD-10-CM

## 2017-01-19 DIAGNOSIS — I4891 Unspecified atrial fibrillation: Secondary | ICD-10-CM | POA: Diagnosis not present

## 2017-01-19 DIAGNOSIS — I6789 Other cerebrovascular disease: Secondary | ICD-10-CM | POA: Diagnosis not present

## 2017-01-19 LAB — POCT INR: INR: 1.8

## 2017-01-19 NOTE — Patient Instructions (Signed)
Description   Today Jan 10th  take 1 and 1/2 tablets, then change coumadin dose to  1 tablet daily except 1 and 1/2 tablets on Sundays and  Wednesdays.  Recheck in 2 weeks. Call us with any medication changes [phone]

## 2017-01-20 ENCOUNTER — Encounter: Payer: Medicare Other | Admitting: Physician Assistant

## 2017-01-20 DIAGNOSIS — I890 Lymphedema, not elsewhere classified: Secondary | ICD-10-CM | POA: Diagnosis not present

## 2017-01-21 ENCOUNTER — Other Ambulatory Visit: Payer: Self-pay | Admitting: Interventional Cardiology

## 2017-01-22 NOTE — Progress Notes (Signed)
[name], [name] ([id number]) Visit Report for 01/20/2017 Chief Complaint Document Details Patient Name: [name], [name]. Date of Service: 01/20/2017 9:15 AM Medical Record Number: [id number] Patient Account Number: [id number] Date of Birth/Sex: [birth year] (84 y.o. Male) Treating RN: Roger Shelter Primary Care Provider: Gaynelle Arabian Other Clinician: Referring Provider: Gaynelle Arabian Treating Provider/Extender: Melburn Hake, HOYT Weeks in Treatment: 4 Information Obtained from: Patient Chief Complaint He is seen for follow up evaluation of lle ulcers Electronic Signature(s) Signed: 01/22/2017 5:30:41 PM By: Worthy Keeler PA-C Entered By: Worthy Keeler on 01/20/2017 09:37:03 [name] ([id number]) -------------------------------------------------------------------------------- Debridement Details Patient Name: [name] Date of Service: 01/20/2017 9:15 AM Medical Record Number: [id number] Patient Account Number: [id number] Date of Birth/Sex: 02-Dec-1930 (84 y.o. Male) Treating RN: Roger Shelter Primary Care Provider: Gaynelle Arabian Other Clinician: Referring Provider: Gaynelle Arabian Treating Provider/Extender: Melburn Hake, HOYT Weeks in Treatment: 4 Debridement Performed for Wound #1 Left,Midline,Posterior Lower Leg Assessment: Performed By: Physician STONE III, HOYT E., PA-C Debridement: Debridement Severity of Tissue Pre Fat layer exposed Debridement: Pre-procedure Verification/Time Yes - 09:41 Out Taken: Start Time: 09:41 Pain Control: Other : lidocaine 4% Level: Skin/Subcutaneous Tissue Total Area Debrided (L x W): 1.6 (cm) x 2.5 (cm) = 4 (cm) Tissue and other material Non-Viable, Fibrin/Slough, Skin, Subcutaneous debrided: Instrument: Curette Bleeding: Moderate Hemostasis Achieved: Pressure End Time: 09:43 Procedural Pain: 0 Post Procedural Pain: 0 Response to Treatment: Procedure was tolerated well Post Debridement Measurements of Total  Wound Length: (cm) 1.6 Width: (cm) 2.5 Depth: (cm) 0.3 Volume: (cm) 0.942 Character of Wound/Ulcer Post Debridement: Stable Severity of Tissue Post Debridement: Fat layer exposed Post Procedure Diagnosis Same as Pre-procedure Electronic Signature(s) Signed: 01/20/2017 11:15:20 AM By: Roger Shelter Signed: 01/22/2017 5:30:41 PM By: Worthy Keeler PA-C Entered By: Roger Shelter on 01/20/2017 09:42:39 [name] ([id number]) -------------------------------------------------------------------------------- HPI Details Patient Name: [name] Date of Service: 01/20/2017 9:15 AM Medical Record Number: [id number] Patient Account Number: [id number] Date of Birth/Sex: [birth year] (84 y.o. Male) Treating RN: Roger Shelter Primary Care Provider: Gaynelle Arabian Other Clinician: Referring Provider: Gaynelle Arabian Treating Provider/Extender: Melburn Hake, HOYT Weeks in Treatment: 4 History of Present Illness Location: left lower extremity swelling with ulceration on the posterior calf Modifying Factors: Other treatment(s) tried include:and seen his PCP who thought there was cellulitis and infection and put him on doxycycline for 2 weeks HPI Description: 84 year old gentleman has been referred to [place] by his PCP Dr. Gaynelle Arabian, for a ulcer on the left posterior lower leg which has been there for about 6 weeks. The patient was thought to have an infection and was put on doxycycline for 14 days and referred to the wound center. He has a past medical history of paroxysmal atrial fibrillation, hypertension, diverticulosis, status post right hip replacement, appendectomy. He has not been a smoker since 22. the anterior shin wound has been more recent about 2 weeks and this was caused probably due to a blunt abrasion. 01/14/16 on evaluation today patient continues to show signs of slough covering the wound bed. He does have the Santyl and this seems to be aiding in debridement as a lot  of this has loosened up. With that being said there is still a quite significant amount of necrotic debris noted in the wound bed. He does have some discomfort but states that this is mainly with cleansing of the wound he has just a dull ache otherwise. 01/21/16 on evaluation today patient's ulcer appears to show evidence of some improvement  compared to last week's evaluation this is excellent news. With that being said he is still continuing to have some Slough building up on the surface of the wound. He continues to utilize the [place] and is having some discomfort but nothing significant. Electronic Signature(s) Signed: 01/22/2017 5:30:41 PM By: Worthy Keeler PA-C Entered By: Worthy Keeler on 01/20/2017 09:50:14 [name] ([id number]) -------------------------------------------------------------------------------- Physical Exam Details Patient Name: [name], [name] Date of Service: 01/20/2017 9:15 AM Medical Record Number: [id number] Patient Account Number: [id number] Date of Birth/Sex: 1930/05/15 (84 y.o. Male) Treating RN: Roger Shelter Primary Care Provider: Gaynelle Arabian Other Clinician: Referring Provider: Gaynelle Arabian Treating Provider/Extender: STONE III, HOYT Weeks in Treatment: 4 Constitutional Well-nourished and well-hydrated in no acute distress. Respiratory normal breathing without difficulty. Psychiatric this patient is able to make decisions and demonstrates good insight into disease process. Alert and Oriented x 3. pleasant and cooperative. Notes Patient's wound bed of slough covered with some granulation beginning to form but definitely this is not a good wound surface as of yet. He continues to wear his home compression at this point. This did require sharp debridement today which was performed with good result. Patient tolerated this with only minimal discomfort. There's no evidence of infection. Electronic Signature(s) Signed: 01/22/2017 5:30:41 PM By:  Worthy Keeler PA-C Entered By: Worthy Keeler on 01/20/2017 09:50:57 [name] ([id number]) -------------------------------------------------------------------------------- Physician Orders Details Patient Name: [name] Date of Service: 01/20/2017 9:15 AM Medical Record Number: [id number] Patient Account Number: [id number] Date of Birth/Sex: 04/11/1930 (84 y.o. Male) Treating RN: Roger Shelter Primary Care Provider: Gaynelle Arabian Other Clinician: Referring Provider: Gaynelle Arabian Treating Provider/Extender: Melburn Hake, HOYT Weeks in Treatment: 4 Verbal / Phone Orders: No Diagnosis Coding ICD-10 Coding Code Description I89.0 Lymphedema, not elsewhere classified S81.812A Laceration without foreign body, left lower leg, initial encounter L97.222 Non-pressure chronic ulcer of left calf with fat layer exposed L97.221 Non-pressure chronic ulcer of left calf limited to breakdown of skin Wound Cleansing Wound #1 Left,Midline,Posterior Lower Leg o Clean wound with Normal Saline. Anesthetic (add to Medication List) Wound #1 Left,Midline,Posterior Lower Leg o Topical Lidocaine 4% cream applied to wound bed prior to debridement (In Clinic Only). Skin Barriers/Peri-Wound Care Wound #1 Left,Midline,Posterior Lower Leg o Moisturizing lotion Primary Wound Dressing Wound #1 Left,Midline,Posterior Lower Leg o Santyl Ointment Secondary Dressing Wound #1 Left,Midline,Posterior Lower Leg o Boardered Foam Dressing Dressing Change Frequency Wound #1 Left,Midline,Posterior Lower Leg o Change dressing every day. Follow-up Appointments o Return Appointment in 1 week. Edema Control Wound #1 Left,Midline,Posterior Lower Leg o Patient to wear own compression stockings Electronic Signature(s) Signed: 01/20/2017 11:15:20 AM By: Darin Engels ([id number]) Signed: 01/22/2017 5:30:41 PM By: Worthy Keeler PA-C Entered By: Roger Shelter on  01/20/2017 09:43:14 Radovich, Jeoffrey Massed ([id number]) -------------------------------------------------------------------------------- Problem List Details Patient Name: [name], [name]. Date of Service: 01/20/2017 9:15 AM Medical Record Number: [id number] Patient Account Number: [id number] Date of Birth/Sex: August 27, 1930 (84 y.o. Male) Treating RN: Roger Shelter Primary Care Provider: Gaynelle Arabian Other Clinician: Referring Provider: Gaynelle Arabian Treating Provider/Extender: Melburn Hake, HOYT Weeks in Treatment: 4 Active Problems ICD-10 Encounter Code Description Active Date Diagnosis I89.0 Lymphedema, not elsewhere classified 12/22/2016 Yes N86.767M Laceration without foreign body, left lower leg, initial encounter 12/22/2016 Yes L97.222 Non-pressure chronic ulcer of left calf with fat layer exposed 12/22/2016 Yes L97.221 Non-pressure chronic ulcer of left calf limited to breakdown of skin 12/22/2016 Yes Inactive Problems Resolved Problems Electronic Signature(s) Signed: 01/22/2017 5:30:41  PM By: Worthy Keeler PA-C Entered By: Worthy Keeler on 01/20/2017 09:36:56 Lynam, Jeoffrey Massed ([id number]) -------------------------------------------------------------------------------- Progress Note Details Patient Name: [name], [name]. Date of Service: 01/20/2017 9:15 AM Medical Record Number: [id number] Patient Account Number: [id number] Date of Birth/Sex: 08/22/1930 (84 y.o. Male) Treating RN: Roger Shelter Primary Care Provider: Gaynelle Arabian Other Clinician: Referring Provider: Gaynelle Arabian Treating Provider/Extender: Melburn Hake, HOYT Weeks in Treatment: 4 Subjective Chief Complaint Information obtained from Patient He is seen for follow up evaluation of lle ulcers History of Present Illness (HPI) The following HPI elements were documented for the patient's wound: Location: left lower extremity swelling with ulceration on the posterior calf Modifying Factors: Other treatment(s)  tried include:and seen his PCP who thought there was cellulitis and infection and put him on doxycycline for 30 weeks 84 year old gentleman has been referred to [place] by his PCP Dr. Gaynelle Arabian, for a ulcer on the left posterior lower leg which has been there for about 6 weeks. The patient was thought to have an infection and was put on doxycycline for 14 days and referred to the wound center. He has a past medical history of paroxysmal atrial fibrillation, hypertension, diverticulosis, status post right hip replacement, appendectomy. He has not been a smoker since 26. the anterior shin wound has been more recent about 2 weeks and this was caused probably due to a blunt abrasion. 01/14/16 on evaluation today patient continues to show signs of slough covering the wound bed. He does have the Santyl and this seems to be aiding in debridement as a lot of this has loosened up. With that being said there is still a quite significant amount of necrotic debris noted in the wound bed. He does have some discomfort but states that this is mainly with cleansing of the wound he has just a dull ache otherwise. 01/21/16 on evaluation today patient's ulcer appears to show evidence of some improvement compared to last week's evaluation this is excellent news. With that being said he is still continuing to have some Slough building up on the surface of the wound. He continues to utilize the [place] and is having some discomfort but nothing significant. Patient History Information obtained from Patient. Family History Diabetes - Child, Hypertension - Child, No family history of Cancer, Heart Disease, Kidney Disease, Lung Disease, Seizures, Stroke, Thyroid Problems, Tuberculosis. Social History Former smoker - 32 years +, Marital Status - Married, Alcohol Use - Never, Drug Use - No History, Caffeine Use - Rarely. Review of Systems (ROS) Constitutional Symptoms (General Health) Denies complaints or symptoms of  Fever, Chills. Respiratory The patient has no complaints or symptoms. Cardiovascular Complains or has symptoms of LE edema. Psychiatric The patient has no complaints or symptoms. [name], [name] ([id number]) Objective Constitutional Well-nourished and well-hydrated in no acute distress. Vitals Time Taken: 9:25 AM, Height: 72 in, Weight: 217 lbs, BMI: 29.4, Temperature: 97.9 F, Pulse: 95 bpm, Respiratory Rate: 16 breaths/min, Blood Pressure: 134/64 mmHg. Respiratory normal breathing without difficulty. Psychiatric this patient is able to make decisions and demonstrates good insight into disease process. Alert and Oriented x 3. pleasant and cooperative. General Notes: Patient's wound bed of slough covered with some granulation beginning to form but definitely this is not a good wound surface as of yet. He continues to wear his home compression at this point. This did require sharp debridement today which was performed with good result. Patient tolerated this with only minimal discomfort. There's no evidence of infection. Integumentary (Hair, Skin) Wound #  1 status is Open. Original cause of wound was Gradually Appeared. The wound is located on the Left,Midline,Posterior Lower Leg. The wound measures 1.6cm length x 2.5cm width x 0.2cm depth; 3.142cm^2 area and 0.628cm^3 volume. There is Fat Layer (Subcutaneous Tissue) Exposed exposed. There is no tunneling or undermining noted. There is a medium amount of serosanguineous drainage noted. The wound margin is distinct with the outline attached to the wound base. There is small (1-33%) pink granulation within the wound bed. There is a large (67-100%) amount of necrotic tissue within the wound bed including Adherent Slough. The periwound skin appearance exhibited: Induration. The periwound skin appearance did not exhibit: Callus, Crepitus, Excoriation, Rash, Scarring, Dry/Scaly, Maceration, Atrophie Blanche, Cyanosis, Ecchymosis, Hemosiderin  Staining, Mottled, Pallor, Rubor, Erythema. Periwound temperature was noted as No Abnormality. Assessment Active Problems ICD-10 I89.0 - Lymphedema, not elsewhere classified S81.812A - Laceration without foreign body, left lower leg, initial encounter L97.222 - Non-pressure chronic ulcer of left calf with fat layer exposed L97.221 - Non-pressure chronic ulcer of left calf limited to breakdown of skin [name], [name]. 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 There was a Skin/Subcutaneous Tissue Debridement ([id number]) debridement with total area of 4 sq cm performed by STONE III, HOYT E., PA-C. with the following instrument(s): Curette to remove Non-Viable tissue/material including Fibrin/Slough, Skin, and Subcutaneous after achieving pain control using Other (lidocaine 4%). A time out was conducted at 09:41, prior to the start of the procedure. A Moderate amount of bleeding was controlled with Pressure. The procedure was tolerated well with a pain level of 0 throughout and a pain level of 0 following the procedure. Post Debridement Measurements: 1.6cm length x 2.5cm width x 0.3cm depth; 0.942cm^3 volume. Character of Wound/Ulcer Post Debridement is stable. Severity of Tissue Post Debridement is: Fat layer exposed. Post procedure Diagnosis Wound #1: Same as Pre-Procedure Plan Wound Cleansing: Wound #1 Left,Midline,Posterior Lower Leg: Clean wound with Normal Saline. Anesthetic (add to Medication List): Wound #1 Left,Midline,Posterior Lower Leg: Topical Lidocaine 4% cream applied to wound bed prior to debridement (In Clinic Only). Skin Barriers/Peri-Wound Care: Wound #1 Left,Midline,Posterior Lower Leg: Moisturizing lotion Primary Wound Dressing: Wound #1 Left,Midline,Posterior Lower Leg: Santyl Ointment Secondary Dressing: Wound  #1 Left,Midline,Posterior Lower Leg: Boardered Foam Dressing Dressing Change Frequency: Wound #1 Left,Midline,Posterior Lower Leg: Change dressing every day. Follow-up Appointments: Return Appointment in 1 week. Edema Control: Wound #1 Left,Midline,Posterior Lower Leg: Patient to wear own compression stockings I am going to recommend that we continue with the Current wound care measures including the Santyl for the time being. Hopefully this will continue to do very well for the patient. He's in agreement with this plan. Otherwise we will see him for reevaluation in one week to see were things stand. I'm hopeful we can get to a good wound surface shortly so that we can switch to a different dressing and help heal this along. [name], [name] ([id number]) Please see above for specific wound care orders. We will see patient for re-evaluation in 1 week(s) here in the clinic. If anything worsens or changes patient will contact our office for additional recommendations. Electronic Signature(s) Signed: 01/22/2017 5:30:41 PM By: Worthy Keeler PA-C Entered By: Worthy Keeler on 01/20/2017 09:51:43 [name] ([id number]) -------------------------------------------------------------------------------- ROS/PFSH Details Patient Name: [name] Date of Service: 01/20/2017 9:15 AM Medical Record Number: [id number] Patient Account Number:  [id number] Date of Birth/Sex: [birth year] (84 y.o. Male) Treating RN: Roger Shelter Primary Care Provider: Gaynelle Arabian Other Clinician: Referring Provider: Gaynelle Arabian Treating Provider/Extender: Melburn Hake, HOYT Weeks in Treatment: 4 Information Obtained From Patient Wound History Do you currently have one or more open woundso Yes How many open wounds do you currently haveo 2 Approximately how long have you had your woundso 4 weeks How have you been treating your wound(s) until nowo neosporin Has your wound(s) ever healed and then  re-openedo No Have you had any lab work done in the past montho No Have you tested positive for an antibiotic resistant organism (MRSA, VRE)o No Have you tested positive for osteomyelitis (bone infection)o No Have you had any tests for circulation on your legso No Constitutional Symptoms (General Health) Complaints and Symptoms: Negative for: Fever; Chills Cardiovascular Complaints and Symptoms: Positive for: LE edema Medical History: Positive for: Arrhythmia - A- Fib Negative for: Angina; Congestive Heart Failure; Coronary Artery Disease; Deep Vein Thrombosis; Hypertension; Hypotension; Myocardial Infarction; Peripheral Arterial Disease; Peripheral Venous Disease; Phlebitis; Vasculitis Eyes Medical History: Negative for: Cataracts; Glaucoma; Optic Neuritis Ear/Nose/Mouth/Throat Medical History: Negative for: Chronic sinus problems/congestion; Middle ear problems Hematologic/Lymphatic Medical History: Negative for: Anemia; Hemophilia; Human Immunodeficiency Virus; Lymphedema; Sickle Cell Disease Respiratory Complaints and Symptoms: No Complaints or Symptoms Medical History: Negative for: Aspiration; Asthma; Chronic Obstructive Pulmonary Disease (COPD); Pneumothorax; Sleep Apnea; [name], [name] ([id number]) Tuberculosis Gastrointestinal Medical History: Negative for: Cirrhosis ; Colitis; Crohnos; Hepatitis A; Hepatitis B; Hepatitis C Endocrine Medical History: Negative for: Type I Diabetes; Type II Diabetes Genitourinary Medical History: Negative for: End Stage Renal Disease Immunological Medical History: Negative for: Lupus Erythematosus; Raynaudos Integumentary (Skin) Medical History: Negative for: History of Burn; History of pressure wounds Musculoskeletal Medical History: Negative for: Gout; Rheumatoid Arthritis; Osteoarthritis; Osteomyelitis Neurologic Medical History: Negative for: Dementia; Neuropathy; Quadriplegia; Paraplegia; Seizure  Disorder Psychiatric Complaints and Symptoms: No Complaints or Symptoms Medical History: Negative for: Anorexia/bulimia; Confinement Anxiety Immunizations Pneumococcal Vaccine: Received Pneumococcal Vaccination: No Implantable Devices Family and Social History Cancer: No; Diabetes: Yes - Child; Heart Disease: No; Hypertension: Yes - Child; Kidney Disease: No; Lung Disease: No; Seizures: No; Stroke: No; Thyroid Problems: No; Tuberculosis: No; Former smoker - 62 years +; Marital Status - Married; Alcohol Use: Never; Drug Use: No History; Caffeine Use: Rarely; Financial Concerns: No; Food, Clothing or Shelter Needs: No; Support System Lacking: No; Transportation Concerns: No; Advanced Directives: No; Patient does not want information on Advanced Directives; Do not resuscitate: No; Living Will: No; Medical Power of Attorney: No Physician Affirmation [name], [name] ([id number]) I have reviewed and agree with the above information. Electronic Signature(s) Signed: 01/20/2017 11:15:20 AM By: Roger Shelter Signed: 01/22/2017 5:30:41 PM By: Worthy Keeler PA-C Entered By: Worthy Keeler on 01/20/2017 09:50:36 Strehle, Jeoffrey Massed ([id number]) -------------------------------------------------------------------------------- SuperBill Details Patient Name: [name] Date of Service: 01/20/2017 Medical Record Number: [id number] Patient Account Number: [id number] Date of Birth/Sex: 07-06-1930 (84 y.o. Male) Treating RN: Roger Shelter Primary Care Provider: Gaynelle Arabian Other Clinician: Referring Provider: Gaynelle Arabian Treating Provider/Extender: Melburn Hake, HOYT Weeks in Treatment: 4 Diagnosis Coding ICD-10 Codes Code Description I89.0 Lymphedema, not elsewhere classified S81.812A Laceration without foreign body, left lower leg, initial encounter L97.222 Non-pressure chronic ulcer of left calf with fat layer exposed L97.221 Non-pressure chronic ulcer of left calf limited to  breakdown of skin Facility Procedures CPT4 Code: 41937902 Description: 11042 - DEB SUBQ TISSUE 20 SQ CM/< ICD-10 Diagnosis Description L97.222 Non-pressure chronic ulcer of left  calf with fat layer expo Modifier: sed Quantity: 1 Physician Procedures CPT4 Code: 5400867 Description: 61950 - WC PHYS SUBQ TISS 20 SQ CM ICD-10 Diagnosis Description L97.222 Non-pressure chronic ulcer of left calf with fat layer expo Modifier: sed Quantity: 1 Electronic Signature(s) Signed: 01/22/2017 5:30:41 PM By: Worthy Keeler PA-C Entered By: Worthy Keeler on 01/20/2017 09:51:58

## 2017-01-23 NOTE — Progress Notes (Signed)
[name], [name] ([id number]) Visit Report for 01/20/2017 Arrival Information Details Patient Name: [name], [name]. Date of Service: 01/20/2017 9:15 AM Medical Record Number: [id number] Patient Account Number: [id number] Date of Birth/Sex: 1930/09/24 (84 y.o. Male) Treating RN: Roger Shelter Primary Care Kobie Whidby: Gaynelle Arabian Other Clinician: Referring Redell Bhandari: Gaynelle Arabian Treating Aubreana Cornacchia/Extender: Melburn Hake, HOYT Weeks in Treatment: 4 Visit Information History Since Last Visit All ordered tests and consults were completed: No Patient Arrived: Ambulatory Added or deleted any medications: No Arrival Time: 09:23 Any new allergies or adverse reactions: No Accompanied By: wife Had a fall or experienced change in No Transfer Assistance: None activities of daily living that may affect Patient Requires Transmission-Based No risk of falls: Precautions: Signs or symptoms of abuse/neglect since last visito No Patient Has Alerts: No Hospitalized since last visit: No Pain Present Now: No Electronic Signature(s) Signed: 01/20/2017 11:15:20 AM By: Roger Shelter Entered By: Roger Shelter on 01/20/2017 09:23:33 [name] ([id number]) -------------------------------------------------------------------------------- Encounter Discharge Information Details Patient Name: [name] Date of Service: 01/20/2017 9:15 AM Medical Record Number: [id number] Patient Account Number: [id number] Date of Birth/Sex: October 29, 1930 (84 y.o. Male) Treating RN: Roger Shelter Primary Care Gurdeep Keesey: Gaynelle Arabian Other Clinician: Referring Donal Lynam: Gaynelle Arabian Treating Alby Schwabe/Extender: Melburn Hake, HOYT Weeks in Treatment: 4 Encounter Discharge Information Items Discharge Pain Level: 0 Discharge Condition: Stable Ambulatory Status: Ambulatory Discharge Destination: Home Transportation: Private Auto Accompanied By: wife Schedule Follow-up Appointment: Yes Medication  Reconciliation completed and No provided to Patient/Care Docia Klar: Provided on Clinical Summary of Care: 01/20/2017 Form Type Recipient Paper Patient AN Electronic Signature(s) Signed: 01/23/2017 12:46:48 PM By: Ruthine Dose Entered By: Ruthine Dose on 01/20/2017 09:52:38 [name] ([id number]) -------------------------------------------------------------------------------- Lower Extremity Assessment Details Patient Name: [name] Date of Service: 01/20/2017 9:15 AM Medical Record Number: [id number] Patient Account Number: [id number] Date of Birth/Sex: [birth year] (84 y.o. Male) Treating RN: Roger Shelter Primary Care Lenka Zhao: Gaynelle Arabian Other Clinician: Referring Derisha Funderburke: Gaynelle Arabian Treating [place]/Extender: Melburn Hake, HOYT Weeks in Treatment: 4 Edema Assessment Assessed: [Left: No] [Right: No] Edema: [Left: N] [Right: o] Vascular Assessment Claudication: Claudication Assessment [Left:None] Pulses: Dorsalis Pedis Palpable: [Left:Yes] Posterior Tibial Extremity colors, hair growth, and conditions: Extremity Color: [Left:Hyperpigmented] Hair Growth on Extremity: [Left:No] Temperature of Extremity: [Left:Warm] Capillary Refill: [Left:> 3 seconds] Toe Nail Assessment Left: Right: Thick: Yes Discolored: Yes Deformed: Yes Improper Length and Hygiene: Yes Electronic Signature(s) Signed: 01/20/2017 11:15:20 AM By: Roger Shelter Entered By: Roger Shelter on 01/20/2017 09:40:08 [name] ([id number]) -------------------------------------------------------------------------------- Multi-Disciplinary Care Plan Details Patient Name: [name] Date of Service: 01/20/2017 9:15 AM Medical Record Number: [id number] Patient Account Number: [id number] Date of Birth/Sex: 1930-04-01 (84 y.o. Male) Treating RN: Roger Shelter Primary Care Glendell Fouse: Gaynelle Arabian Other Clinician: Referring Seneca Gadbois: Gaynelle Arabian Treating  Silvino Selman/Extender: Melburn Hake, HOYT Weeks in Treatment: 4 Active Inactive ` Orientation to the Wound Care Program Nursing Diagnoses: Knowledge deficit related to the wound healing center program Goals: Patient/caregiver will verbalize understanding of the [facility] Program Date Initiated: 12/22/2016 Target Resolution Date: 01/12/2017 Goal Status: Active Interventions: Provide education on orientation to the wound center Notes: ` Wound/Skin Impairment Nursing Diagnoses: Impaired tissue integrity Knowledge deficit related to ulceration/compromised skin integrity Goals: Patient/caregiver will verbalize understanding of skin care regimen Date Initiated: 12/22/2016 Target Resolution Date: 01/12/2017 Goal Status: Active Ulcer/skin breakdown will have a volume reduction of 30% by week 4 Date Initiated: 12/22/2016 Target Resolution Date: 01/12/2017 Goal Status: Active Interventions: Assess patient/caregiver ability to obtain necessary supplies  Assess ulceration(s) every visit Treatment Activities: Skin care regimen initiated : 12/22/2016 Notes: Electronic Signature(s) Signed: 01/20/2017 11:15:20 AM By: Darin Engels ([id number]) Entered By: Roger Shelter on 01/20/2017 09:40:47 [name] ([id number]) -------------------------------------------------------------------------------- Pain Assessment Details Patient Name: [name] Date of Service: 01/20/2017 9:15 AM Medical Record Number: [id number] Patient Account Number: [id number] Date of Birth/Sex: Jun 19, 1930 (84 y.o. Male) Treating RN: Roger Shelter Primary Care Hien Perreira: Gaynelle Arabian Other Clinician: Referring Joci Dress: Gaynelle Arabian Treating Dimples Probus/Extender: Melburn Hake, HOYT Weeks in Treatment: 4 Active Problems Location of Pain Severity and Description of Pain Patient Has Paino No Site Locations Pain Management and Medication Current Pain Management: Electronic  Signature(s) Signed: 01/20/2017 11:15:20 AM By: Roger Shelter Entered By: Roger Shelter on 01/20/2017 09:23:39 [name] ([id number]) -------------------------------------------------------------------------------- Patient/Caregiver Education Details Patient Name: [name] Date of Service: 01/20/2017 9:15 AM Medical Record Number: [id number] Patient Account Number: [id number] Date of Birth/Gender: [birth year] (84 y.o. Male) Treating RN: Roger Shelter Primary Care Physician: Gaynelle Arabian Other Clinician: Referring Physician: Gaynelle Arabian Treating Physician/Extender: Sharalyn Ink in Treatment: 4 Education Assessment Education Provided To: Patient Education Topics Provided Wound Debridement: Handouts: Wound Debridement Methods: Explain/Verbal Responses: State content correctly Wound/Skin Impairment: Handouts: Caring for Your Ulcer Methods: Explain/Verbal Responses: State content correctly Electronic Signature(s) Signed: 01/20/2017 11:15:20 AM By: Roger Shelter Entered By: Roger Shelter on 01/20/2017 09:46:59 [name], [name] ([id number]) -------------------------------------------------------------------------------- Wound Assessment Details Patient Name: [name] Date of Service: 01/20/2017 9:15 AM Medical Record Number: [id number] Patient Account Number: [id number] Date of Birth/Sex: 11/30/1930 (84 y.o. Male) Treating RN: Roger Shelter Primary Care Jerusha Reising: Gaynelle Arabian Other Clinician: Referring Taneah Masri: Gaynelle Arabian Treating Mikiala Fugett/Extender: Melburn Hake, HOYT Weeks in Treatment: 4 Wound Status Wound Number: 1 Primary Etiology: Venous Leg Ulcer Wound Location: Left Lower Leg - Midline, Posterior Wound Status: Open Wounding Event: Gradually Appeared Comorbid History: Arrhythmia Date Acquired: 12/01/2016 Weeks Of Treatment: 4 Clustered Wound: No Photos Photo Uploaded By: Roger Shelter on 01/20/2017 11:42:43 Wound  Measurements Length: (cm) 1.6 Width: (cm) 2.5 Depth: (cm) 0.2 Area: (cm) 3.142 Volume: (cm) 0.628 % Reduction in Area: -166.7% % Reduction in Volume: -432.2% Epithelialization: None Tunneling: No Undermining: No Wound Description Classification: Partial Thickness Wound Margin: Distinct, outline attached Exudate Amount: Medium Exudate Type: Serosanguineous Exudate Color: red, brown Foul Odor After Cleansing: No Slough/Fibrino Yes Wound Bed Granulation Amount: Small (1-33%) Exposed Structure Granulation Quality: Pink Fat Layer (Subcutaneous Tissue) Exposed: Yes Necrotic Amount: Large (67-100%) Necrotic Quality: Adherent Slough Periwound Skin Texture Texture Color No Abnormalities Noted: No No Abnormalities Noted: No Callus: No Atrophie Blanche: No [name], [name] ([id number]) Crepitus: No Cyanosis: No Excoriation: No Ecchymosis: No Induration: Yes Erythema: No Rash: No Hemosiderin Staining: No Scarring: No Mottled: No Pallor: No Moisture Rubor: No No Abnormalities Noted: No Dry / Scaly: No Temperature / Pain Maceration: No Temperature: No Abnormality Wound Preparation Ulcer Cleansing: Rinsed/Irrigated with Saline Topical Anesthetic Applied: Other: lidocaine 4%, Treatment Notes Wound #1 (Left, Midline, Posterior Lower Leg) 1. Cleansed with: Clean wound with Normal Saline 2. Anesthetic Topical Lidocaine 4% cream to wound bed prior to debridement 3. Peri-wound Care: Moisturizing lotion 4. Dressing Applied: Santyl Ointment 5. Secondary Dressing Applied Bordered Foam Dressing Electronic Signature(s) Signed: 01/20/2017 11:15:20 AM By: Roger Shelter Entered By: Roger Shelter on 01/20/2017 09:39:20 [name] ([id number]) -------------------------------------------------------------------------------- Vitals Details Patient Name: [name] Date of Service: 01/20/2017 9:15 AM Medical Record Number: [id number] Patient Account Number:  [id number] Date of Birth/Sex: 1930-02-28 (84  y.o. Male) Treating RN: Roger Shelter Primary Care Markell Sciascia: Gaynelle Arabian Other Clinician: Referring Tanayia Wahlquist: Gaynelle Arabian Treating Murdock Jellison/Extender: Melburn Hake, HOYT Weeks in Treatment: 4 Vital Signs Time Taken: 09:25 Temperature (F): 97.9 Height (in): 72 Pulse (bpm): 95 Weight (lbs): 217 Respiratory Rate (breaths/min): 16 Body Mass Index (BMI): 29.4 Blood Pressure (mmHg): 134/64 Reference Range: 80 - 120 mg / dl Electronic Signature(s) Signed: 01/20/2017 11:15:20 AM By: Roger Shelter Entered By: Roger Shelter on 01/20/2017 09:25:50

## 2017-01-27 ENCOUNTER — Encounter: Payer: Medicare Other | Admitting: Physician Assistant

## 2017-01-27 DIAGNOSIS — I890 Lymphedema, not elsewhere classified: Secondary | ICD-10-CM | POA: Diagnosis not present

## 2017-01-30 NOTE — Progress Notes (Signed)
[name] ([id number]) Visit Report for 01/27/2017 Arrival Information Details Patient Name: [name], [name]. Date of Service: 01/27/2017 1:45 PM Medical Record Number: [id number] Patient Account Number: [id number] Date of Birth/Sex: 03/31/1930 (84 y.o. Male) Treating RN: Roger Shelter Primary Care Breshae Belcher: Gaynelle Arabian Other Clinician: Referring Esco Joslyn: Gaynelle Arabian Treating Dmitry Macomber/Extender: Melburn Hake, HOYT Weeks in Treatment: 5 Visit Information History Since Last Visit All ordered tests and consults were completed: No Patient Arrived: Ambulatory Added or deleted any medications: No Arrival Time: 14:07 Any new allergies or adverse reactions: No Accompanied By: wife Had a fall or experienced change in No Transfer Assistance: None activities of daily living that may affect Patient Requires Transmission-Based No risk of falls: Precautions: Signs or symptoms of abuse/neglect since last visito No Patient Has Alerts: No Hospitalized since last visit: No Pain Present Now: No Electronic Signature(s) Signed: 01/27/2017 5:14:03 PM By: Roger Shelter Entered By: Roger Shelter on 01/27/2017 14:08:48 [name] ([id number]) -------------------------------------------------------------------------------- Encounter Discharge Information Details Patient Name: [name] Date of Service: 01/27/2017 1:45 PM Medical Record Number: [id number] Patient Account Number: [id number] Date of Birth/Sex: 11-12-1930 (84 y.o. Male) Treating RN: Roger Shelter Primary Care Rasha Ibe: Gaynelle Arabian Other Clinician: Referring Heidee Audi: Gaynelle Arabian Treating Dwane Andres/Extender: Melburn Hake, HOYT Weeks in Treatment: 5 Encounter Discharge Information Items Discharge Pain Level: 0 Discharge Condition: Stable Ambulatory Status: Ambulatory Discharge Destination: Home Transportation: Private Auto Accompanied By: wife Schedule Follow-up Appointment: Yes Medication  Reconciliation completed and No provided to Patient/Care Erandi Lemma: Provided on Clinical Summary of Care: 01/27/2017 Form Type Recipient Paper Patient AN Electronic Signature(s) Signed: 01/27/2017 5:14:03 PM By: Roger Shelter Entered By: Roger Shelter on 01/27/2017 15:04:21 [name] ([id number]) -------------------------------------------------------------------------------- Lower Extremity Assessment Details Patient Name: [name] Date of Service: 01/27/2017 1:45 PM Medical Record Number: [id number] Patient Account Number: [id number] Date of Birth/Sex: 06-Jan-1931 (84 y.o. Male) Treating RN: Roger Shelter Primary Care Gari Trovato: Gaynelle Arabian Other Clinician: Referring Arnett Duddy: Gaynelle Arabian Treating Bronsyn Shappell/Extender: Melburn Hake, HOYT Weeks in Treatment: 5 Edema Assessment Assessed: [Left: No] [Right: No] Edema: [Left: N] [Right: o] Vascular Assessment Claudication: Claudication Assessment [Left:None] Pulses: Dorsalis Pedis Palpable: [Left:Yes] Posterior Tibial Extremity colors, hair growth, and conditions: Extremity Color: [Left:Hyperpigmented] Hair Growth on Extremity: [Left:No] Temperature of Extremity: [Left:Warm] Capillary Refill: [Left:< 3 seconds] Toe Nail Assessment Left: Right: Thick: Yes Discolored: Yes Deformed: Yes Improper Length and Hygiene: Yes Electronic Signature(s) Signed: 01/27/2017 5:14:03 PM By: Roger Shelter Entered By: Roger Shelter on 01/27/2017 14:14:32 [name] ([id number]) -------------------------------------------------------------------------------- Multi Wound Chart Details Patient Name: [name] Date of Service: 01/27/2017 1:45 PM Medical Record Number: [id number] Patient Account Number: [id number] Date of Birth/Sex: [birth year] (84 y.o. Male) Treating RN: Roger Shelter Primary Care Jalei Shibley: Gaynelle Arabian Other Clinician: Referring Kalicia Dufresne: Gaynelle Arabian Treating Emmilynn Marut/Extender:  Melburn Hake, HOYT Weeks in Treatment: 5 Vital Signs Height(in): 72 Pulse(bpm): 89 Weight(lbs): 217 Blood Pressure(mmHg): 148/80 Body Mass Index(BMI): 29 Temperature(F): 98.1 Respiratory Rate 16 (breaths/min): Photos: [1:No Photos] [N/A:N/A] Wound Location: [1:Left Lower Leg - Midline, Posterior] [N/A:N/A] Wounding Event: [1:Gradually Appeared] [N/A:N/A] Primary Etiology: [1:Venous Leg Ulcer] [N/A:N/A] Comorbid History: [1:Arrhythmia] [N/A:N/A] Date Acquired: [1:12/01/2016] [N/A:N/A] Weeks of Treatment: [1:5] [N/A:N/A] Wound Status: [1:Open] [N/A:N/A] Measurements L x W x D [1:1.7x2.5x0.2] [N/A:N/A] (cm) Area (cm) : [1:3.338] [N/A:N/A] Volume (cm) : [1:0.668] [N/A:N/A] % Reduction in Area: [1:-183.40%] [N/A:N/A] % Reduction in Volume: [1:-466.10%] [N/A:N/A] Classification: [1:Partial Thickness] [N/A:N/A] Exudate Amount: [1:Medium] [N/A:N/A] Exudate Type: [1:Serosanguineous] [N/A:N/A] Exudate Color: [1:red, brown] [N/A:N/A] Wound Margin: [1:Distinct, outline attached] [  N/A:N/A] Granulation Amount: [1:Medium (34-66%)] [N/A:N/A] Granulation Quality: [1:Red] [N/A:N/A] Necrotic Amount: [1:Medium (34-66%)] [N/A:N/A] Exposed Structures: [1:Fat Layer (Subcutaneous Tissue) Exposed: Yes] [N/A:N/A] Epithelialization: [1:None] [N/A:N/A] Periwound Skin Texture: [1:Scarring: Yes Excoriation: No Induration: No Callus: No Crepitus: No Rash: No] [N/A:N/A] Periwound Skin Moisture: [1:Maceration: No Dry/Scaly: No] [N/A:N/A] Periwound Skin Color: [1:Atrophie Blanche: No Cyanosis: No Ecchymosis: No] [N/A:N/A] Erythema: No Hemosiderin Staining: No Mottled: No Pallor: No Rubor: No Temperature: No Abnormality N/A N/A Tenderness on Palpation: Yes N/A N/A Wound Preparation: Ulcer Cleansing: N/A N/A Rinsed/Irrigated with Saline Topical Anesthetic Applied: Other: lidocaine 4% Treatment Notes Electronic Signature(s) Signed: 01/27/2017 5:14:03 PM By: Roger Shelter Entered By:  Roger Shelter on 01/27/2017 14:14:49 [name] ([id number]) -------------------------------------------------------------------------------- Multi-Disciplinary Care Plan Details Patient Name: [name] Date of Service: 01/27/2017 1:45 PM Medical Record Number: [id number] Patient Account Number: [id number] Date of Birth/Sex: 25-May-1930 (84 y.o. Male) Treating RN: Roger Shelter Primary Care Aishi Courts: Gaynelle Arabian Other Clinician: Referring Emalee Knies: Gaynelle Arabian Treating Arlon Bleier/Extender: Melburn Hake, HOYT Weeks in Treatment: 5 Active Inactive ` Orientation to the Wound Care Program Nursing Diagnoses: Knowledge deficit related to the wound healing center program Goals: Patient/caregiver will verbalize understanding of the [facility] Program Date Initiated: 12/22/2016 Target Resolution Date: 01/12/2017 Goal Status: Active Interventions: Provide education on orientation to the wound center Notes: ` Wound/Skin Impairment Nursing Diagnoses: Impaired tissue integrity Knowledge deficit related to ulceration/compromised skin integrity Goals: Patient/caregiver will verbalize understanding of skin care regimen Date Initiated: 12/22/2016 Target Resolution Date: 01/12/2017 Goal Status: Active Ulcer/skin breakdown will have a volume reduction of 30% by week 4 Date Initiated: 12/22/2016 Target Resolution Date: 01/12/2017 Goal Status: Active Interventions: Assess patient/caregiver ability to obtain necessary supplies Assess ulceration(s) every visit Treatment Activities: Skin care regimen initiated : 12/22/2016 Notes: Electronic Signature(s) Signed: 01/27/2017 5:14:03 PM By: Darin Engels ([id number]) Entered By: Roger Shelter on 01/27/2017 14:14:37 [name] ([id number]) -------------------------------------------------------------------------------- Pain Assessment Details Patient Name: [name] Date of Service: 01/27/2017  1:45 PM Medical Record Number: [id number] Patient Account Number: [id number] Date of Birth/Sex: January 19, 1930 (84 y.o. Male) Treating RN: Roger Shelter Primary Care Israa Caban: Gaynelle Arabian Other Clinician: Referring Sheral Pfahler: Gaynelle Arabian Treating Myliah Medel/Extender: Melburn Hake, HOYT Weeks in Treatment: 5 Active Problems Location of Pain Severity and Description of Pain Patient Has Paino No Site Locations Pain Management and Medication Current Pain Management: Electronic Signature(s) Signed: 01/27/2017 5:14:03 PM By: Roger Shelter Entered By: Roger Shelter on 01/27/2017 14:08:54 [name] ([id number]) -------------------------------------------------------------------------------- Patient/Caregiver Education Details Patient Name: [name] Date of Service: 01/27/2017 1:45 PM Medical Record Number: [id number] Patient Account Number: [id number] Date of Birth/Gender: September 22, 1930 (84 y.o. Male) Treating RN: Roger Shelter Primary Care Physician: Gaynelle Arabian Other Clinician: Referring Physician: Gaynelle Arabian Treating Physician/Extender: Sharalyn Ink in Treatment: 5 Education Assessment Education Provided To: Patient and Caregiver Education Topics Provided Wound Debridement: Handouts: Wound Debridement Methods: Explain/Verbal Responses: State content correctly Wound/Skin Impairment: Handouts: Caring for Your Ulcer Methods: Explain/Verbal Responses: State content correctly Electronic Signature(s) Signed: 01/27/2017 5:14:03 PM By: Roger Shelter Entered By: Roger Shelter on 01/27/2017 15:04:38 [name] ([id number]) -------------------------------------------------------------------------------- Wound Assessment Details Patient Name: [name] Date of Service: 01/27/2017 1:45 PM Medical Record Number: [id number] Patient Account Number: [id number] Date of Birth/Sex: [birth year] (84 y.o. Male) Treating RN: Roger Shelter Primary  Care Tekelia Kareem: Gaynelle Arabian Other Clinician: Referring Xaivier Malay: Gaynelle Arabian Treating Loys Hoselton/Extender: STONE III, HOYT Weeks in Treatment: 5 Wound Status Wound Number: 1 Primary Etiology: Venous Leg  Ulcer Wound Location: Left Lower Leg - Midline, Posterior Wound Status: Open Wounding Event: Gradually Appeared Comorbid History: Arrhythmia Date Acquired: 12/01/2016 Weeks Of Treatment: 5 Clustered Wound: No Photos Photo Uploaded By: Roger Shelter on 01/27/2017 17:13:08 Wound Measurements Length: (cm) 1.7 Width: (cm) 2.5 Depth: (cm) 0.2 Area: (cm) 3.338 Volume: (cm) 0.668 % Reduction in Area: -183.4% % Reduction in Volume: -466.1% Epithelialization: None Tunneling: No Undermining: No Wound Description Classification: Partial Thickness Wound Margin: Distinct, outline attached Exudate Amount: Medium Exudate Type: Serosanguineous Exudate Color: red, brown Foul Odor After Cleansing: No Slough/Fibrino Yes Wound Bed Granulation Amount: Medium (34-66%) Exposed Structure Granulation Quality: Red Fat Layer (Subcutaneous Tissue) Exposed: Yes Necrotic Amount: Medium (34-66%) Necrotic Quality: Adherent Slough Periwound Skin Texture Texture Color No Abnormalities Noted: No No Abnormalities Noted: No Callus: No Atrophie Blanche: No AUBURN, HERT ([id number]) Crepitus: No Cyanosis: No Excoriation: No Ecchymosis: No Induration: No Erythema: No Rash: No Hemosiderin Staining: No Scarring: Yes Mottled: No Pallor: No Moisture Rubor: No No Abnormalities Noted: No Dry / Scaly: No Temperature / Pain Maceration: No Temperature: No Abnormality Tenderness on Palpation: Yes Wound Preparation Ulcer Cleansing: Rinsed/Irrigated with Saline Topical Anesthetic Applied: Other: lidocaine 4%, Treatment Notes Wound #1 (Left, Midline, Posterior Lower Leg) 1. Cleansed with: Clean wound with Normal Saline 2. Anesthetic Topical Lidocaine 4% cream to wound bed prior to  debridement Hurricaine Topical Anesthetic Spray 4. Dressing Applied: Santyl Ointment 5. Secondary Dressing Applied Bordered Foam Dressing 7. Secured with Patient to wear own compression stockings Electronic Signature(s) Signed: 01/27/2017 5:14:03 PM By: Roger Shelter Entered By: Roger Shelter on 01/27/2017 14:14:04 [name] ([id number]) -------------------------------------------------------------------------------- Vitals Details Patient Name: [name] Date of Service: 01/27/2017 1:45 PM Medical Record Number: [id number] Patient Account Number: [id number] Date of Birth/Sex: 21-Dec-1930 (84 y.o. Male) Treating RN: Roger Shelter Primary Care Abygayle Deltoro: Gaynelle Arabian Other Clinician: Referring Bryne Lindon: Gaynelle Arabian Treating Abanoub Hanken/Extender: Melburn Hake, HOYT Weeks in Treatment: 5 Vital Signs Time Taken: 02:08 Temperature (F): 98.1 Height (in): 72 Pulse (bpm): 89 Weight (lbs): 217 Respiratory Rate (breaths/min): 16 Body Mass Index (BMI): 29.4 Blood Pressure (mmHg): 148/80 Reference Range: 80 - 120 mg / dl Electronic Signature(s) Signed: 01/27/2017 5:14:03 PM By: Roger Shelter Entered By: Roger Shelter on 01/27/2017 14:09:16

## 2017-01-30 NOTE — Progress Notes (Signed)
[name], [name] ([id number]) Visit Report for 01/27/2017 Chief Complaint Document Details Patient Name: [name], [name]. Date of Service: 01/27/2017 1:45 PM Medical Record Number: [id number] Patient Account Number: [id number] Date of Birth/Sex: 05/29/1930 (84 y.o. Male) Treating RN: Roger Shelter Primary Care Provider: Gaynelle Arabian Other Clinician: Referring Provider: Gaynelle Arabian Treating Provider/Extender: Melburn Hake, Bev Drennen Weeks in Treatment: 5 Information Obtained from: Patient Chief Complaint He is seen for follow up evaluation of lle ulcers Electronic Signature(s) Signed: 01/30/2017 11:19:49 AM By: Worthy Keeler PA-C Entered By: Worthy Keeler on 01/27/2017 13:54:39 [name] ([id number]) -------------------------------------------------------------------------------- Debridement Details Patient Name: [name] Date of Service: 01/27/2017 1:45 PM Medical Record Number: [id number] Patient Account Number: [id number] Date of Birth/Sex: 05/08/1930 (84 y.o. Male) Treating RN: Roger Shelter Primary Care Provider: Gaynelle Arabian Other Clinician: Referring Provider: Gaynelle Arabian Treating Provider/Extender: Melburn Hake, Hal Norrington Weeks in Treatment: 5 Debridement Performed for Wound #1 Left,Midline,Posterior Lower Leg Assessment: Performed By: Physician STONE III, Moranda Billiot E., PA-C Debridement: Debridement Severity of Tissue Pre Fat layer exposed Debridement: Pre-procedure Verification/Time Yes - 02:55 Out Taken: Start Time: 02:55 Pain Control: Other : lidocaine 4% Level: Skin/Subcutaneous Tissue Total Area Debrided (L x W): 1.7 (cm) x 2.5 (cm) = 4.25 (cm) Tissue and other material Viable, Non-Viable, Fibrin/Slough, Skin, Subcutaneous debrided: Instrument: Curette Bleeding: Moderate Hemostasis Achieved: Pressure End Time: 02:56 Procedural Pain: 0 Post Procedural Pain: 0 Response to Treatment: Procedure was tolerated well Post Debridement Measurements of  Total Wound Length: (cm) 1.7 Width: (cm) 2.5 Depth: (cm) 0.3 Volume: (cm) 1.001 Character of Wound/Ulcer Post Debridement: Stable Severity of Tissue Post Debridement: Fat layer exposed Post Procedure Diagnosis Same as Pre-procedure Electronic Signature(s) Signed: 01/27/2017 5:14:03 PM By: Roger Shelter Signed: 01/30/2017 11:19:49 AM By: Worthy Keeler PA-C Entered By: Roger Shelter on 01/27/2017 14:57:02 [name] ([id number]) -------------------------------------------------------------------------------- HPI Details Patient Name: [name] Date of Service: 01/27/2017 1:45 PM Medical Record Number: [id number] Patient Account Number: [id number] Date of Birth/Sex: [birth year] (84 y.o. Male) Treating RN: Roger Shelter Primary Care Provider: Gaynelle Arabian Other Clinician: Referring Provider: Gaynelle Arabian Treating Provider/Extender: Melburn Hake, Keasia Dubose Weeks in Treatment: 5 History of Present Illness Location: left lower extremity swelling with ulceration on the posterior calf Modifying Factors: Other treatment(s) tried include:and seen his PCP who thought there was cellulitis and infection and put him on doxycycline for 2 weeks HPI Description: 84 year old gentleman has been referred to [place] by his PCP Dr. Gaynelle Arabian, for a ulcer on the left posterior lower leg which has been there for about 6 weeks. The patient was thought to have an infection and was put on doxycycline for 14 days and referred to the wound center. He has a past medical history of paroxysmal atrial fibrillation, hypertension, diverticulosis, status post right hip replacement, appendectomy. He has not been a smoker since 37. the anterior shin wound has been more recent about 2 weeks and this was caused probably due to a blunt abrasion. 01/27/17 on evaluation today patient appears to be doing somewhat better in regard to his wound. He still has slough covering all the week that week this seems to  be getting better and better. Overall I'm pleased with the progress that he has made. There does not appear to be any evidence of infection which is good news. Electronic Signature(s) Signed: 01/30/2017 11:19:49 AM By: Worthy Keeler PA-C Entered By: Worthy Keeler on 01/27/2017 17:25:34 [name] ([id number]) -------------------------------------------------------------------------------- Physical Exam Details Patient Name: [name]  Date of Service: 01/27/2017 1:45 PM Medical Record Number: [id number] Patient Account Number: [id number] Date of Birth/Sex: [birth year] (84 y.o. Male) Treating RN: Roger Shelter Primary Care Provider: Gaynelle Arabian Other Clinician: Referring Provider: Gaynelle Arabian Treating Provider/Extender: STONE III, Mardell Cragg Weeks in Treatment: 5 Constitutional Well-nourished and well-hydrated in no acute distress. Respiratory normal breathing without difficulty. Psychiatric this patient is able to make decisions and demonstrates good insight into disease process. Alert and Oriented x 3. pleasant and cooperative. Notes Patient's wound bed does show slough along with some okay granulation tissue as well at the surface. Nonetheless I was able to debride away some of the poor granulation and Slough today and he did have a better surface although we're still not at the point of an excellent granulation bed yet. We are working toward that end. He tolerated debridement today with only minimal discomfort. There's no evidence of infection. Electronic Signature(s) Signed: 01/30/2017 11:19:49 AM By: Worthy Keeler PA-C Entered By: Worthy Keeler on 01/27/2017 17:26:11 [name] ([id number]) -------------------------------------------------------------------------------- Physician Orders Details Patient Name: [name] Date of Service: 01/27/2017 1:45 PM Medical Record Number: [id number] Patient Account Number: [id number] Date of Birth/Sex: 1930/05/30  (84 y.o. Male) Treating RN: Roger Shelter Primary Care Provider: Gaynelle Arabian Other Clinician: Referring Provider: Gaynelle Arabian Treating Provider/Extender: Melburn Hake, Khylan Sawyer Weeks in Treatment: 5 Verbal / Phone Orders: No Diagnosis Coding ICD-10 Coding Code Description I89.0 Lymphedema, not elsewhere classified S81.812A Laceration without foreign body, left lower leg, initial encounter L97.222 Non-pressure chronic ulcer of left calf with fat layer exposed L97.221 Non-pressure chronic ulcer of left calf limited to breakdown of skin Wound Cleansing Wound #1 Left,Midline,Posterior Lower Leg o Clean wound with Normal Saline. Anesthetic (add to Medication List) Wound #1 Left,Midline,Posterior Lower Leg o Topical Lidocaine 4% cream applied to wound bed prior to debridement (In Clinic Only). Skin Barriers/Peri-Wound Care Wound #1 Left,Midline,Posterior Lower Leg o Moisturizing lotion Primary Wound Dressing Wound #1 Left,Midline,Posterior Lower Leg o Santyl Ointment Secondary Dressing Wound #1 Left,Midline,Posterior Lower Leg o Boardered Foam Dressing Dressing Change Frequency Wound #1 Left,Midline,Posterior Lower Leg o Change dressing every day. Follow-up Appointments o Return Appointment in 1 week. Edema Control Wound #1 Left,Midline,Posterior Lower Leg o Patient to wear own compression stockings Electronic Signature(s) Signed: 01/27/2017 5:14:03 PM By: Darin Engels ([id number]) Signed: 01/30/2017 11:19:49 AM By: Worthy Keeler PA-C Entered By: Roger Shelter on 01/27/2017 14:57:42 Denning, Jeoffrey Massed ([id number]) -------------------------------------------------------------------------------- Problem List Details Patient Name: HEMI, CHACKO. Date of Service: 01/27/2017 1:45 PM Medical Record Number: [id number] Patient Account Number: [id number] Date of Birth/Sex: 1930-06-01 (84 y.o. Male) Treating RN: Roger Shelter Primary Care  Provider: Gaynelle Arabian Other Clinician: Referring Provider: Gaynelle Arabian Treating Provider/Extender: Melburn Hake, Dhruvi Crenshaw Weeks in Treatment: 5 Active Problems ICD-10 Encounter Code Description Active Date Diagnosis I89.0 Lymphedema, not elsewhere classified 12/22/2016 Yes U20.254Y Laceration without foreign body, left lower leg, initial encounter 12/22/2016 Yes L97.222 Non-pressure chronic ulcer of left calf with fat layer exposed 12/22/2016 Yes L97.221 Non-pressure chronic ulcer of left calf limited to breakdown of skin 12/22/2016 Yes Inactive Problems Resolved Problems Electronic Signature(s) Signed: 01/30/2017 11:19:49 AM By: Worthy Keeler PA-C Entered By: Worthy Keeler on 01/27/2017 13:54:33 Fiser, Jeoffrey Massed ([id number]) -------------------------------------------------------------------------------- Progress Note Details Patient Name: [name] Date of Service: 01/27/2017 1:45 PM Medical Record Number: [id number] Patient Account Number: [id number] Date of Birth/Sex: 1930-06-12 (84 y.o. Male) Treating RN: Roger Shelter Primary Care Provider: Gaynelle Arabian Other Clinician: Referring Provider: Marisue Humble,  ROBERT Treating Provider/Extender: STONE III, Roark Rufo Weeks in Treatment: 5 Subjective Chief Complaint Information obtained from Patient He is seen for follow up evaluation of lle ulcers History of Present Illness (HPI) The following HPI elements were documented for the patient's wound: Location: left lower extremity swelling with ulceration on the posterior calf Modifying Factors: Other treatment(s) tried include:and seen his PCP who thought there was cellulitis and infection and put him on doxycycline for 107 weeks 84 year old gentleman has been referred to [place] by his PCP Dr. Gaynelle Arabian, for a ulcer on the left posterior lower leg which has been there for about 6 weeks. The patient was thought to have an infection and was put on doxycycline for 14 days and referred  to the wound center. He has a past medical history of paroxysmal atrial fibrillation, hypertension, diverticulosis, status post right hip replacement, appendectomy. He has not been a smoker since 43. the anterior shin wound has been more recent about 2 weeks and this was caused probably due to a blunt abrasion. 01/27/17 on evaluation today patient appears to be doing somewhat better in regard to his wound. He still has slough covering all the week that week this seems to be getting better and better. Overall I'm pleased with the progress that he has made. There does not appear to be any evidence of infection which is good news. Patient History Information obtained from Patient. Family History Diabetes - Child, Hypertension - Child, No family history of Cancer, Heart Disease, Kidney Disease, Lung Disease, Seizures, Stroke, Thyroid Problems, Tuberculosis. Social History Former smoker - 10 years +, Marital Status - Married, Alcohol Use - Never, Drug Use - No History, Caffeine Use - Rarely. Review of Systems (ROS) Constitutional Symptoms (General Health) Denies complaints or symptoms of Fever, Chills. Respiratory The patient has no complaints or symptoms. Cardiovascular Complains or has symptoms of LE edema. Psychiatric The patient has no complaints or symptoms. LONNIE, RETH ([id number]) Objective Constitutional Well-nourished and well-hydrated in no acute distress. Vitals Time Taken: 2:08 AM, Height: 72 in, Weight: 217 lbs, BMI: 29.4, Temperature: 98.1 F, Pulse: 89 bpm, Respiratory Rate: 16 breaths/min, Blood Pressure: 148/80 mmHg. Respiratory normal breathing without difficulty. Psychiatric this patient is able to make decisions and demonstrates good insight into disease process. Alert and Oriented x 3. pleasant and cooperative. General Notes: Patient's wound bed does show slough along with some okay granulation tissue as well at the surface. Nonetheless I was able to debride  away some of the poor granulation and Slough today and he did have a better surface although we're still not at the point of an excellent granulation bed yet. We are working toward that end. He tolerated debridement today with only minimal discomfort. There's no evidence of infection. Integumentary (Hair, Skin) Wound #1 status is Open. Original cause of wound was Gradually Appeared. The wound is located on the Left,Midline,Posterior Lower Leg. The wound measures 1.7cm length x 2.5cm width x 0.2cm depth; 3.338cm^2 area and 0.668cm^3 volume. There is Fat Layer (Subcutaneous Tissue) Exposed exposed. There is no tunneling or undermining noted. There is a medium amount of serosanguineous drainage noted. The wound margin is distinct with the outline attached to the wound base. There is medium (34-66%) red granulation within the wound bed. There is a medium (34-66%) amount of necrotic tissue within the wound bed including Adherent Slough. The periwound skin appearance exhibited: Scarring. The periwound skin appearance did not exhibit: Callus, Crepitus, Excoriation, Induration, Rash, Dry/Scaly, Maceration, Atrophie Blanche, Cyanosis, Ecchymosis, Hemosiderin Staining,  Mottled, Pallor, Rubor, Erythema. Periwound temperature was noted as No Abnormality. The periwound has tenderness on palpation. 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 There was a Skin/Subcutaneous Tissue Debridement ([id number]) debridement with total area of  4.25 sq cm performed by STONE III, Sonam Huelsmann E., PA-C. with the following instrument(s): Curette to remove Viable and Non-Viable tissue/material including Fibrin/Slough, Skin, and Subcutaneous after achieving pain control using Other (lidocaine 4%). A time out was conducted at 02:55, prior to the start of the procedure. A Moderate amount of bleeding was controlled with Pressure. The procedure was tolerated well with a pain level of 0 throughout and a pain level of 0 following the procedure. Post Debridement Measurements: 1.7cm length x 2.5cm width x 0.3cm depth; 1.001cm^3 volume. Character of Wound/Ulcer Post Debridement is stable. Severity of Tissue Post Debridement is: Fat layer exposed. Post procedure Diagnosis Wound #1: Same as Pre-Procedure Plan Wound Cleansing: Wound #1 Left,Midline,Posterior Lower Leg: Clean wound with Normal Saline. Anesthetic (add to Medication List): Wound #1 Left,Midline,Posterior Lower Leg: Topical Lidocaine 4% cream applied to wound bed prior to debridement (In Clinic Only). Skin Barriers/Peri-Wound Care: Wound #1 Left,Midline,Posterior Lower Leg: Moisturizing lotion Primary Wound Dressing: Wound #1 Left,Midline,Posterior Lower Leg: Santyl Ointment Secondary Dressing: Wound #1 Left,Midline,Posterior Lower Leg: Boardered Foam Dressing Dressing Change Frequency: Wound #1 Left,Midline,Posterior Lower Leg: Change dressing every day. Follow-up Appointments: Return Appointment in 1 week. Edema Control: Wound #1 Left,Midline,Posterior Lower Leg: Patient to wear own compression stockings I'm gonna recommend that we continue with the Santyl for the time being as I do feel like this is helpful for him. Patient's in agreement with this plan. We will see were things stand in one weeks time. Please see above for specific wound care orders. We will see patient for re-evaluation in 1 week(s) here in the clinic. If anything worsens or changes patient will contact our office  for additional recommendations. Electronic Signature(s) Signed: 01/30/2017 11:19:49 AM By: Worthy Keeler PA-C Entered By: Worthy Keeler on 01/27/2017 17:26:36 TRAMPAS, STETTNER ([id number]) [name] ([id number]) -------------------------------------------------------------------------------- ROS/PFSH Details Patient Name: [name] Date of Service: 01/27/2017 1:45 PM Medical Record Number: [id number] Patient Account Number: [id number] Date of Birth/Sex: Jan 19, 1930 (84 y.o. Male) Treating RN: Roger Shelter Primary Care Provider: Gaynelle Arabian Other Clinician: Referring Provider: Gaynelle Arabian Treating Provider/Extender: Melburn Hake, Emmanuella Mirante Weeks in Treatment: 5 Information Obtained From Patient Wound History Do you currently have one or more open woundso Yes How many open wounds do you currently haveo 2 Approximately how long have you had your woundso 4 weeks How have you been treating your wound(s) until nowo neosporin Has your wound(s) ever healed and then re-openedo No Have you had any lab work done in the past montho No Have you tested positive for an antibiotic resistant organism (MRSA, VRE)o No Have you tested positive for osteomyelitis (bone infection)o No Have you had any tests for circulation on your legso No Constitutional Symptoms (General Health) Complaints and Symptoms: Negative for: Fever; Chills Cardiovascular Complaints and Symptoms: Positive for: LE edema Medical History: Positive for: Arrhythmia - A-  Fib Negative for: Angina; Congestive Heart Failure; Coronary Artery Disease; Deep Vein Thrombosis; Hypertension; Hypotension; Myocardial Infarction; Peripheral Arterial Disease; Peripheral Venous Disease; Phlebitis; Vasculitis Eyes Medical History: Negative for: Cataracts; Glaucoma; Optic Neuritis Ear/Nose/Mouth/Throat Medical History: Negative for: Chronic sinus problems/congestion; Middle ear problems Hematologic/Lymphatic Medical  History: Negative for: Anemia; Hemophilia; Human Immunodeficiency Virus; Lymphedema; Sickle Cell Disease Respiratory Complaints and Symptoms: No Complaints or Symptoms Medical History: Negative for: Aspiration; Asthma; Chronic Obstructive Pulmonary Disease (COPD); Pneumothorax; Sleep Apnea; BASEL, DEFALCO ([id number]) Tuberculosis Gastrointestinal Medical History: Negative for: Cirrhosis ; Colitis; Crohnos; Hepatitis A; Hepatitis B; Hepatitis C Endocrine Medical History: Negative for: Type I Diabetes; Type II Diabetes Genitourinary Medical History: Negative for: End Stage Renal Disease Immunological Medical History: Negative for: Lupus Erythematosus; Raynaudos Integumentary (Skin) Medical History: Negative for: History of Burn; History of pressure wounds Musculoskeletal Medical History: Negative for: Gout; Rheumatoid Arthritis; Osteoarthritis; Osteomyelitis Neurologic Medical History: Negative for: Dementia; Neuropathy; Quadriplegia; Paraplegia; Seizure Disorder Psychiatric Complaints and Symptoms: No Complaints or Symptoms Medical History: Negative for: Anorexia/bulimia; Confinement Anxiety Immunizations Pneumococcal Vaccine: Received Pneumococcal Vaccination: No Implantable Devices Family and Social History Cancer: No; Diabetes: Yes - Child; Heart Disease: No; Hypertension: Yes - Child; Kidney Disease: No; Lung Disease: No; Seizures: No; Stroke: No; Thyroid Problems: No; Tuberculosis: No; Former smoker - 87 years +; Marital Status - Married; Alcohol Use: Never; Drug Use: No History; Caffeine Use: Rarely; Financial Concerns: No; Food, Clothing or Shelter Needs: No; Support System Lacking: No; Transportation Concerns: No; Advanced Directives: No; Patient does not want information on Advanced Directives; Do not resuscitate: No; Living Will: No; Medical Power of Attorney: No Physician Affirmation MATAEO, INGWERSEN ([id number]) I have reviewed and agree with the above  information. Electronic Signature(s) Signed: 01/27/2017 5:26:41 PM By: Roger Shelter Signed: 01/30/2017 11:19:49 AM By: Worthy Keeler PA-C Entered By: Worthy Keeler on 01/27/2017 17:26:00 [name] ([id number]) -------------------------------------------------------------------------------- SuperBill Details Patient Name: [name] Date of Service: 01/27/2017 Medical Record Number: [id number] Patient Account Number: [id number] Date of Birth/Sex: November 27, 1930 (84 y.o. Male) Treating RN: Roger Shelter Primary Care Provider: Gaynelle Arabian Other Clinician: Referring Provider: Gaynelle Arabian Treating Provider/Extender: Melburn Hake, Mecca Guitron Weeks in Treatment: 5 Diagnosis Coding ICD-10 Codes Code Description I89.0 Lymphedema, not elsewhere classified S81.812A Laceration without foreign body, left lower leg, initial encounter L97.222 Non-pressure chronic ulcer of left calf with fat layer exposed L97.221 Non-pressure chronic ulcer of left calf limited to breakdown of skin Facility Procedures CPT4 Code: 37902409 Description: [place] - DEB SUBQ TISSUE 20 SQ CM/< ICD-10 Diagnosis Description L97.222 Non-pressure chronic ulcer of left calf with fat layer expo Modifier: sed Quantity: 1 Physician Procedures CPT4 Code: [id number] Description: 24268 - WC PHYS SUBQ TISS 20 SQ CM ICD-10 Diagnosis Description L97.222 Non-pressure chronic ulcer of left calf with fat layer expo Modifier: sed Quantity: 1 Electronic Signature(s) Signed: 01/30/2017 11:19:49 AM By: Worthy Keeler PA-C Entered By: Worthy Keeler on 01/27/2017 17:26:48

## 2017-02-02 ENCOUNTER — Ambulatory Visit (INDEPENDENT_AMBULATORY_CARE_PROVIDER_SITE_OTHER): Payer: Medicare Other | Admitting: *Deleted

## 2017-02-02 DIAGNOSIS — Z5181 Encounter for therapeutic drug level monitoring: Secondary | ICD-10-CM

## 2017-02-02 DIAGNOSIS — I4891 Unspecified atrial fibrillation: Secondary | ICD-10-CM | POA: Diagnosis not present

## 2017-02-02 DIAGNOSIS — I6789 Other cerebrovascular disease: Secondary | ICD-10-CM

## 2017-02-02 LAB — POCT INR: INR: 2.1

## 2017-02-02 NOTE — Patient Instructions (Signed)
Description   Continue taking 1 tablet daily except 1 and 1/2 tablets on Sundays and  Wednesdays.  Recheck in 3 weeks. Call us with any medication changes [phone]

## 2017-02-03 ENCOUNTER — Encounter: Payer: Medicare Other | Admitting: Physician Assistant

## 2017-02-03 DIAGNOSIS — I890 Lymphedema, not elsewhere classified: Secondary | ICD-10-CM | POA: Diagnosis not present

## 2017-02-05 NOTE — Progress Notes (Signed)
JOB, HOLTSCLAW ([id number]) Visit Report for 02/03/2017 Chief Complaint Document Details Patient Name: [name], [name]. Date of Service: 02/03/2017 1:30 PM Medical Record Number: [id number] Patient Account Number: [id number] Date of Birth/Sex: 10/20/1930 (84 y.o. Male) Treating RN: Primary Care Provider: Gaynelle Arabian Other Clinician: Referring Provider: Gaynelle Arabian Treating Provider/Extender: Melburn Hake, HOYT Weeks in Treatment: 6 Information Obtained from: Patient Chief Complaint He is seen for follow up evaluation of lle ulcers Electronic Signature(s) Signed: 02/04/2017 12:23:43 AM By: Worthy Keeler PA-C Entered By: Worthy Keeler on 02/03/2017 14:05:34 [name] ([id number]) -------------------------------------------------------------------------------- Debridement Details Patient Name: [name] Date of Service: 02/03/2017 1:30 PM Medical Record Number: [id number] Patient Account Number: [id number] Date of Birth/Sex: [birth year] (84 y.o. Male) Treating RN: Cornell Barman Primary Care Provider: Gaynelle Arabian Other Clinician: Referring Provider: Gaynelle Arabian Treating Provider/Extender: Melburn Hake, HOYT Weeks in Treatment: 6 Debridement Performed for Wound #1 Left,Midline,Posterior Lower Leg Assessment: Performed By: Physician STONE III, HOYT E., PA-C Debridement: Debridement Severity of Tissue Pre Fat layer exposed Debridement: Pre-procedure Verification/Time Yes - 14:27 Out Taken: Start Time: 14:27 Pain Control: Other : lidocaine 4% Level: Skin/Subcutaneous Tissue Total Area Debrided (L x W): 2 (cm) x 3 (cm) = 6 (cm) Tissue and other material Viable, Non-Viable, Fibrin/Slough, Subcutaneous debrided: Instrument: Curette Bleeding: Moderate Hemostasis Achieved: Pressure End Time: 14:29 Procedural Pain: 0 Post Procedural Pain: 0 Response to Treatment: Procedure was tolerated well Post Debridement Measurements of Total Wound Length: (cm) 2 Width:  (cm) 3 Depth: (cm) 0.3 Volume: (cm) 1.414 Character of Wound/Ulcer Post Debridement: Stable Severity of Tissue Post Debridement: Limited to breakdown of skin Post Procedure Diagnosis Same as Pre-procedure Electronic Signature(s) Signed: 02/03/2017 5:31:47 PM By: Gretta Cool, BSN, RN, CWS, Kim RN, BSN Signed: 02/04/2017 12:23:43 AM By: Worthy Keeler PA-C Entered By: Gretta Cool, BSN, RN, CWS, Kim on 02/03/2017 14:28:39 [name] ([id number]) -------------------------------------------------------------------------------- HPI Details Patient Name: [name] Date of Service: 02/03/2017 1:30 PM Medical Record Number: [id number] Patient Account Number: [id number] Date of Birth/Sex: Jun 05, 1930 (84 y.o. Male) Treating RN: Primary Care Provider: Gaynelle Arabian Other Clinician: Referring Provider: Gaynelle Arabian Treating Provider/Extender: Melburn Hake, HOYT Weeks in Treatment: 6 History of Present Illness Location: left lower extremity swelling with ulceration on the posterior calf Modifying Factors: Other treatment(s) tried include:and seen his PCP who thought there was cellulitis and infection and put him on doxycycline for 2 weeks HPI Description: 84 year old gentleman has been referred to [place] by his PCP Dr. Gaynelle Arabian, for a ulcer on the left posterior lower leg which has been there for about 6 weeks. The patient was thought to have an infection and was put on doxycycline for 14 days and referred to the wound center. He has a past medical history of paroxysmal atrial fibrillation, hypertension, diverticulosis, status post right hip replacement, appendectomy. He has not been a smoker since 85. the anterior shin wound has been more recent about 2 weeks and this was caused probably due to a blunt abrasion. 02/03/17 on evaluation today patient appears to be doing better in regard to the overall appearance of this wound although it does actually measure a little bit larger today  unfortunately. Nonetheless I'm still pleased with the progress that has been made as compared to previous weeks this is the best that I've seen the wound at this time. There's no evidence of infection at this time which is good news. He still does have discomfort and tells me that it may be "hurts just a  little bit more". Still this is not terribly significant. Electronic Signature(s) Signed: 02/04/2017 12:23:43 AM By: Worthy Keeler PA-C Entered By: Worthy Keeler on 02/03/2017 18:11:14 [name] ([id number]) -------------------------------------------------------------------------------- Physical Exam Details Patient Name: [name] Date of Service: 02/03/2017 1:30 PM Medical Record Number: [id number] Patient Account Number: [id number] Date of Birth/Sex: Mar 03, 1930 (84 y.o. Male) Treating RN: Primary Care Provider: Gaynelle Arabian Other Clinician: Referring Provider: Gaynelle Arabian Treating Provider/Extender: STONE III, HOYT Weeks in Treatment: 6 Constitutional Well-nourished and well-hydrated in no acute distress. Respiratory normal breathing without difficulty. Psychiatric this patient is able to make decisions and demonstrates good insight into disease process. Alert and Oriented x 3. pleasant and cooperative. Notes Patient's wound did require sharp debridement today which he tolerated without complication he did have some discomfort but fortunately not significant. Overall post debridement the wound bed did appear to be improved I do think we can switch from the [place] today. Electronic Signature(s) Signed: 02/04/2017 12:23:43 AM By: Worthy Keeler PA-C Entered By: Worthy Keeler on 02/03/2017 18:11:48 [name] ([id number]) -------------------------------------------------------------------------------- Physician Orders Details Patient Name: [name] Date of Service: 02/03/2017 1:30 PM Medical Record Number: [id number] Patient Account Number:  [id number] Date of Birth/Sex: Jul 10, 1930 (84 y.o. Male) Treating RN: Cornell Barman Primary Care Provider: Gaynelle Arabian Other Clinician: Referring Provider: Gaynelle Arabian Treating Provider/Extender: Melburn Hake, HOYT Weeks in Treatment: 6 Verbal / Phone Orders: No Diagnosis Coding ICD-10 Coding Code Description I89.0 Lymphedema, not elsewhere classified S81.812A Laceration without foreign body, left lower leg, initial encounter L97.222 Non-pressure chronic ulcer of left calf with fat layer exposed L97.221 Non-pressure chronic ulcer of left calf limited to breakdown of skin Wound Cleansing Wound #1 Left,Midline,Posterior Lower Leg o Clean wound with Normal Saline. Anesthetic (add to Medication List) Wound #1 Left,Midline,Posterior Lower Leg o Topical Lidocaine 4% cream applied to wound bed prior to debridement (In Clinic Only). Skin Barriers/Peri-Wound Care Wound #1 Left,Midline,Posterior Lower Leg o Moisturizing lotion Primary Wound Dressing Wound #1 Left,Midline,Posterior Lower Leg o Aquacel Ag Secondary Dressing Wound #1 Left,Midline,Posterior Lower Leg o Boardered Foam Dressing Dressing Change Frequency Wound #1 Left,Midline,Posterior Lower Leg o Change dressing every other day. Follow-up Appointments o Return Appointment in 1 week. Edema Control Wound #1 Left,Midline,Posterior Lower Leg o Patient to wear own compression stockings Patient Medications Allergies: No Known Drug Allergies [name], [name] ([id number]) Notifications Medication Indication Start End lidocaine DOSE topical 4 % cream - cream topical Electronic Signature(s) Signed: 02/03/2017 5:31:47 PM By: Gretta Cool, BSN, RN, CWS, Kim RN, BSN Signed: 02/04/2017 12:23:43 AM By: Worthy Keeler PA-C Entered By: Gretta Cool, BSN, RN, CWS, Kim on 02/03/2017 14:35:38 [name] ([id number]) -------------------------------------------------------------------------------- Problem List Details Patient Name:  DAMAREE, SARGENT. Date of Service: 02/03/2017 1:30 PM Medical Record Number: [id number] Patient Account Number: [id number] Date of Birth/Sex: November 24, 1930 (84 y.o. Male) Treating RN: Primary Care Provider: Gaynelle Arabian Other Clinician: Referring Provider: Gaynelle Arabian Treating Provider/Extender: Melburn Hake, HOYT Weeks in Treatment: 6 Active Problems ICD-10 Encounter Code Description Active Date Diagnosis I89.0 Lymphedema, not elsewhere classified 12/22/2016 Yes F75.102H Laceration without foreign body, left lower leg, initial encounter 12/22/2016 Yes L97.222 Non-pressure chronic ulcer of left calf with fat layer exposed 12/22/2016 Yes L97.221 Non-pressure chronic ulcer of left calf limited to breakdown of skin 12/22/2016 Yes Inactive Problems Resolved Problems Electronic Signature(s) Signed: 02/04/2017 12:23:43 AM By: Worthy Keeler PA-C Entered By: Worthy Keeler on 02/03/2017 14:05:22 [name] ([id number]) -------------------------------------------------------------------------------- Progress Note Details Patient Name:  [name], Danny J. Date of Service: 02/03/2017 1:30 PM Medical Record Number: [id number] Patient Account Number: [id number] Date of Birth/Sex: [birth year] (84 y.o. Male) Treating RN: Primary Care Provider: Gaynelle Arabian Other Clinician: Referring Provider: Gaynelle Arabian Treating Provider/Extender: Melburn Hake, HOYT Weeks in Treatment: 6 Subjective Chief Complaint Information obtained from Patient He is seen for follow up evaluation of lle ulcers History of Present Illness (HPI) The following HPI elements were documented for the patient's wound: Location: left lower extremity swelling with ulceration on the posterior calf Modifying Factors: Other treatment(s) tried include:and seen his PCP who thought there was cellulitis and infection and put him on doxycycline for 74 weeks 84 year old gentleman has been referred to [place] by his PCP Dr. Gaynelle Arabian, for a  ulcer on the left posterior lower leg which has been there for about 6 weeks. The patient was thought to have an infection and was put on doxycycline for 14 days and referred to the wound center. He has a past medical history of paroxysmal atrial fibrillation, hypertension, diverticulosis, status post right hip replacement, appendectomy. He has not been a smoker since 51. the anterior shin wound has been more recent about 2 weeks and this was caused probably due to a blunt abrasion. 02/03/17 on evaluation today patient appears to be doing better in regard to the overall appearance of this wound although it does actually measure a little bit larger today unfortunately. Nonetheless I'm still pleased with the progress that has been made as compared to previous weeks this is the best that I've seen the wound at this time. There's no evidence of infection at this time which is good news. He still does have discomfort and tells me that it may be "hurts just a little bit more". Still this is not terribly significant. Patient History Information obtained from Patient. Family History Diabetes - Child, Hypertension - Child, No family history of Cancer, Heart Disease, Kidney Disease, Lung Disease, Seizures, Stroke, Thyroid Problems, Tuberculosis. Social History Former smoker - 34 years +, Marital Status - Married, Alcohol Use - Never, Drug Use - No History, Caffeine Use - Rarely. Review of Systems (ROS) Constitutional Symptoms (General Health) Denies complaints or symptoms of Fever, Chills. Respiratory The patient has no complaints or symptoms. Cardiovascular Complains or has symptoms of LE edema. Psychiatric The patient has no complaints or symptoms. [name], [name] ([id number]) Objective Constitutional Well-nourished and well-hydrated in no acute distress. Vitals Time Taken: 1:55 PM, Height: 72 in, Weight: 217 lbs, BMI: 29.4, Temperature: 98.2 F, Pulse: 75 bpm, Respiratory Rate: 16  breaths/min, Blood Pressure: 136/65 mmHg. Respiratory normal breathing without difficulty. Psychiatric this patient is able to make decisions and demonstrates good insight into disease process. Alert and Oriented x 3. pleasant and cooperative. General Notes: Patient's wound did require sharp debridement today which he tolerated without complication he did have some discomfort but fortunately not significant. Overall post debridement the wound bed did appear to be improved I do think we can switch from the [place] today. Integumentary (Hair, Skin) Wound #1 status is Open. Original cause of wound was Gradually Appeared. The wound is located on the Left,Midline,Posterior Lower Leg. The wound measures 2cm length x 3cm width x 0.2cm depth; 4.712cm^2 area and 0.942cm^3 volume. There is Fat Layer (Subcutaneous Tissue) Exposed exposed. There is no tunneling or undermining noted. There is a large amount of serous drainage noted. The wound margin is distinct with the outline attached to the wound base. There is medium (34-66%) red granulation within the wound  bed. There is a medium (34-66%) amount of necrotic tissue within the wound bed including Adherent Slough. The periwound skin appearance exhibited: Induration, Scarring, Ecchymosis. The periwound skin appearance did not exhibit: Callus, Crepitus, Excoriation, Rash, Dry/Scaly, Maceration, Atrophie Blanche, Cyanosis, Hemosiderin Staining, Mottled, Pallor, Rubor, Erythema. Periwound temperature was noted as No Abnormality. The periwound has tenderness on palpation. 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 There was a Skin/Subcutaneous Tissue Debridement (53614-43154) debridement with total area of 6 sq cm performed by STONE III, HOYT E., PA-C. with the following instrument(s): Curette to remove Viable and Non-Viable tissue/material including Fibrin/Slough and Subcutaneous after achieving pain control using Other (lidocaine 4%). A time out was conducted at 14:27, prior to the start of the procedure. A Moderate amount of bleeding was controlled with Pressure. The procedure was tolerated well with a pain level of 0 throughout and a pain level of 0 following the procedure. Post Debridement Measurements: 2cm length x 3cm width x 0.3cm depth; 1.414cm^3 volume. Character of Wound/Ulcer Post Debridement is stable. Severity of Tissue Post Debridement is: Limited to breakdown of skin. Post procedure Diagnosis Wound #1: Same as Pre-Procedure Plan Wound Cleansing: Wound #1 Left,Midline,Posterior Lower Leg: Clean wound with Normal Saline. Anesthetic (add to Medication List): Wound #1 Left,Midline,Posterior Lower Leg: Topical Lidocaine 4% cream applied to wound bed prior to debridement (In Clinic Only). Skin Barriers/Peri-Wound Care: Wound #1 Left,Midline,Posterior Lower Leg: Moisturizing lotion Primary Wound Dressing: Wound #1 Left,Midline,Posterior Lower Leg: Aquacel Ag Secondary Dressing: Wound #1 Left,Midline,Posterior Lower Leg: Boardered Foam Dressing Dressing Change Frequency: Wound #1 Left,Midline,Posterior Lower Leg: Change dressing every other day. Follow-up Appointments: Return Appointment in 1 week. Edema Control: Wound #1 Left,Midline,Posterior Lower Leg: Patient to wear own compression stockings The following medication(s) was prescribed: lidocaine topical 4 % cream cream topical was prescribed at facility We are gonna actually initiate treatment with a  silver alginate dressing at this time which I think will be very appropriate for him. This will also help prevent any infection if anything is attempting to start. He is in agreement with this plan. We'll see were things stand in one weeks time. Please see above for specific wound care orders. We will see patient for re-evaluation in 1 week(s) here in the clinic. If anything worsens or changes patient will contact our office for additional recommendations. [name], [name] ([id number]) Electronic Signature(s) Signed: 02/04/2017 12:23:43 AM By: Worthy Keeler PA-C Entered By: Worthy Keeler on 02/03/2017 18:12:54 [name] ([id number]) -------------------------------------------------------------------------------- ROS/PFSH Details Patient Name: [name] Date of Service: 02/03/2017 1:30 PM Medical Record Number: [id number] Patient Account Number: [id number] Date of Birth/Sex: 1930-03-12 (84 y.o. Male) Treating RN: Primary Care Provider: Gaynelle Arabian Other Clinician: Referring Provider: Gaynelle Arabian Treating Provider/Extender: Melburn Hake, HOYT Weeks in Treatment: 6 Information Obtained From Patient Wound History Do you currently have one or more open woundso Yes How many open wounds do you currently haveo 2 Approximately how long have you had your woundso 4 weeks How have you been treating your wound(s) until nowo neosporin Has your wound(s) ever healed and then  re-openedo No Have you had any lab work done in the past montho No Have you tested positive for an antibiotic resistant organism (MRSA, VRE)o No Have you tested positive for osteomyelitis (bone infection)o No Have you had any tests for circulation on your legso No Constitutional Symptoms (General Health) Complaints and Symptoms: Negative for: Fever; Chills Cardiovascular Complaints and Symptoms: Positive for: LE edema Medical History: Positive for: Arrhythmia - A- Fib Negative for: Angina; Congestive Heart  Failure; Coronary Artery Disease; Deep Vein Thrombosis; Hypertension; Hypotension; Myocardial Infarction; Peripheral Arterial Disease; Peripheral Venous Disease; Phlebitis; Vasculitis Eyes Medical History: Negative for: Cataracts; Glaucoma; Optic Neuritis Ear/Nose/Mouth/Throat Medical History: Negative for: Chronic sinus problems/congestion; Middle ear problems Hematologic/Lymphatic Medical History: Negative for: Anemia; Hemophilia; Human Immunodeficiency Virus; Lymphedema; Sickle Cell Disease Respiratory Complaints and Symptoms: No Complaints or Symptoms Medical History: Negative for: Aspiration; Asthma; Chronic Obstructive Pulmonary Disease (COPD); Pneumothorax; Sleep Apnea; CODEN, FRANCHI ([id number]) Tuberculosis Gastrointestinal Medical History: Negative for: Cirrhosis ; Colitis; Crohnos; Hepatitis A; Hepatitis B; Hepatitis C Endocrine Medical History: Negative for: Type I Diabetes; Type II Diabetes Genitourinary Medical History: Negative for: End Stage Renal Disease Immunological Medical History: Negative for: Lupus Erythematosus; Raynaudos Integumentary (Skin) Medical History: Negative for: History of Burn; History of pressure wounds Musculoskeletal Medical History: Negative for: Gout; Rheumatoid Arthritis; Osteoarthritis; Osteomyelitis Neurologic Medical History: Negative for: Dementia; Neuropathy; Quadriplegia; Paraplegia; Seizure Disorder Psychiatric Complaints and Symptoms: No Complaints or Symptoms Medical History: Negative for: Anorexia/bulimia; Confinement Anxiety Immunizations Pneumococcal Vaccine: Received Pneumococcal Vaccination: No Implantable Devices Family and Social History Cancer: No; Diabetes: Yes - Child; Heart Disease: No; Hypertension: Yes - Child; Kidney Disease: No; Lung Disease: No; Seizures: No; Stroke: No; Thyroid Problems: No; Tuberculosis: No; Former smoker - 65 years +; Marital Status - Married; Alcohol Use: Never; Drug Use: No  History; Caffeine Use: Rarely; Financial Concerns: No; Food, Clothing or Shelter Needs: No; Support System Lacking: No; Transportation Concerns: No; Advanced Directives: No; Patient does not want information on Advanced Directives; Do not resuscitate: No; Living Will: No; Medical Power of Attorney: No Physician Affirmation [name], [name] ([id number]) I have reviewed and agree with the above information. Electronic Signature(s) Signed: 02/04/2017 12:23:43 AM By: Worthy Keeler PA-C Entered By: Worthy Keeler on 02/03/2017 18:11:35 [name] ([id number]) -------------------------------------------------------------------------------- SuperBill Details Patient Name: [name] Date of Service: 02/03/2017 Medical Record Number: [id number] Patient Account Number: [id number] Date of Birth/Sex: 30-May-1930 (84 y.o. Male) Treating RN: Primary Care Provider: Gaynelle Arabian Other Clinician: Referring Provider: Gaynelle Arabian Treating Provider/Extender: Melburn Hake, HOYT Weeks in Treatment: 6 Diagnosis Coding ICD-10 Codes Code Description I89.0 Lymphedema, not elsewhere classified S81.812A Laceration without foreign body, left lower leg, initial encounter L97.222 Non-pressure chronic ulcer of left calf with fat layer exposed L97.221 Non-pressure chronic ulcer of left calf limited to breakdown of skin Facility Procedures CPT4 Code: [id number] Description: 05397 - DEB SUBQ TISSUE 20 SQ CM/< ICD-10 Diagnosis Description L97.222 Non-pressure chronic ulcer of left calf with fat layer expo Modifier: sed Quantity: 1 Physician Procedures CPT4 Code: [id number] Description: 79024 - WC PHYS SUBQ TISS 20 SQ CM ICD-10 Diagnosis Description L97.222 Non-pressure chronic ulcer of left calf with fat layer expo Modifier: sed Quantity: 1 Electronic Signature(s) Signed: 02/04/2017 12:23:43 AM By: Worthy Keeler PA-C Entered By: Worthy Keeler on 02/03/2017 18:13:25

## 2017-02-07 NOTE — Progress Notes (Signed)
[name] ([id number]) Visit Report for 02/03/2017 Arrival Information Details Patient Name: [name], [name]. Date of Service: 02/03/2017 1:30 PM Medical Record Number: [id number] Patient Account Number: [id number] Date of Birth/Sex: November 01, 1930 (84 y.o. Male) Treating RN: Cornell Barman Primary Care Albana Saperstein: Gaynelle Arabian Other Clinician: Referring Doshia Dalia: Gaynelle Arabian Treating Khiem Gargis/Extender: Melburn Hake, HOYT Weeks in Treatment: 6 Visit Information History Since Last Visit Added or deleted any medications: No Patient Arrived: Ambulatory Any new allergies or adverse reactions: No Arrival Time: 13:53 Had a fall or experienced change in No Accompanied By: wife activities of daily living that may affect Transfer Assistance: None risk of falls: Patient Identification Verified: Yes Signs or symptoms of abuse/neglect since last visito No Secondary Verification Process Completed: Yes Hospitalized since last visit: No Patient Requires Transmission-Based No Has Dressing in Place as Prescribed: Yes Precautions: Pain Present Now: No Patient Has Alerts: No Electronic Signature(s) Signed: 02/03/2017 5:31:47 PM By: Gretta Cool, BSN, RN, CWS, Kim RN, BSN Entered By: Gretta Cool, BSN, RN, CWS, Kim on 02/03/2017 13:54:54 [name] ([id number]) -------------------------------------------------------------------------------- Encounter Discharge Information Details Patient Name: [name] Date of Service: 02/03/2017 1:30 PM Medical Record Number: [id number] Patient Account Number: [id number] Date of Birth/Sex: 1930/06/16 (84 y.o. Male) Treating RN: Cornell Barman Primary Care Fedor Kazmierski: Gaynelle Arabian Other Clinician: Referring Storey Stangeland: Gaynelle Arabian Treating Rohan Juenger/Extender: Melburn Hake, HOYT Weeks in Treatment: 6 Encounter Discharge Information Items Discharge Pain Level: 0 Discharge Condition: Stable Ambulatory Status: Ambulatory Discharge Destination: Home Transportation: Private  Auto Accompanied By: wife Schedule Follow-up Appointment: Yes Medication Reconciliation completed and Yes provided to Patient/Care Ryker Pherigo: Provided on Clinical Summary of Care: 02/03/2017 Form Type Recipient Paper Patient AN Electronic Signature(s) Signed: 02/07/2017 10:47:22 AM By: Ruthine Dose Entered By: Ruthine Dose on 02/03/2017 14:37:20 [name] ([id number]) -------------------------------------------------------------------------------- Lower Extremity Assessment Details Patient Name: [name] Date of Service: 02/03/2017 1:30 PM Medical Record Number: [id number] Patient Account Number: [id number] Date of Birth/Sex: 1930/02/22 (84 y.o. Male) Treating RN: Cornell Barman Primary Care Knight Oelkers: Gaynelle Arabian Other Clinician: Referring Jabir Dahlem: Gaynelle Arabian Treating Rosalio Catterton/Extender: Melburn Hake, HOYT Weeks in Treatment: 6 Vascular Assessment Pulses: Posterior Tibial Extremity colors, hair growth, and conditions: Extremity Color: [Left:Normal] Hair Growth on Extremity: [Left:No] Temperature of Extremity: [Left:Warm] Capillary Refill: [Left:< 3 seconds] Toe Nail Assessment Left: Right: Thick: Yes Discolored: Yes Deformed: Yes Improper Length and Hygiene: Yes Electronic Signature(s) Signed: 02/03/2017 5:31:47 PM By: Gretta Cool, BSN, RN, CWS, Kim RN, BSN Entered By: Gretta Cool, BSN, RN, CWS, Kim on 02/03/2017 14:01:31 [name] ([id number]) -------------------------------------------------------------------------------- Multi Wound Chart Details Patient Name: [name] Date of Service: 02/03/2017 1:30 PM Medical Record Number: [id number] Patient Account Number: [id number] Date of Birth/Sex: 10-May-1930 (84 y.o. Male) Treating RN: Cornell Barman Primary Care Sherion Dooly: Gaynelle Arabian Other Clinician: Referring Unique Searfoss: Gaynelle Arabian Treating Sinai Illingworth/Extender: Melburn Hake, HOYT Weeks in Treatment: 6 Vital Signs Height(in): 72 Pulse(bpm): 75 Weight(lbs):  217 Blood Pressure(mmHg): 136/65 Body Mass Index(BMI): 29 Temperature(F): 98.2 Respiratory Rate 16 (breaths/min): Photos: [N/A:N/A] Wound Location: Left Lower Leg - Midline, N/A N/A Posterior Wounding Event: Gradually Appeared N/A N/A Primary Etiology: Venous Leg Ulcer N/A N/A Comorbid History: Arrhythmia N/A N/A Date Acquired: 12/01/2016 N/A N/A Weeks of Treatment: 6 N/A N/A Wound Status: Open N/A N/A Measurements L x W x D 2x3x0.2 N/A N/A (cm) Area (cm) : 4.712 N/A N/A Volume (cm) : 0.942 N/A N/A % Reduction in Area: -300.00% N/A N/A % Reduction in Volume: -698.30% N/A N/A Classification: Full Thickness Without N/A N/A Exposed Support  Structures Exudate Amount: Large N/A N/A Exudate Type: Serous N/A N/A Exudate Color: amber N/A N/A Wound Margin: Distinct, outline attached N/A N/A Granulation Amount: Medium (34-66%) N/A N/A Granulation Quality: Red N/A N/A Necrotic Amount: Medium (34-66%) N/A N/A Exposed Structures: Fat Layer (Subcutaneous N/A N/A Tissue) Exposed: Yes Epithelialization: None N/A N/A Periwound Skin Texture: Induration: Yes N/A N/A Scarring: Yes Excoriation: No Callus: No DION, SIBAL. ([id number]) Crepitus: No Rash: No Periwound Skin Moisture: Maceration: No N/A N/A Dry/Scaly: No Periwound Skin Color: Ecchymosis: Yes N/A N/A Atrophie Blanche: No Cyanosis: No Erythema: No Hemosiderin Staining: No Mottled: No Pallor: No Rubor: No Temperature: No Abnormality N/A N/A Tenderness on Palpation: Yes N/A N/A Wound Preparation: Ulcer Cleansing: N/A N/A Rinsed/Irrigated with Saline Topical Anesthetic Applied: Other: lidocaine 4% Treatment Notes Electronic Signature(s) Signed: 02/03/2017 5:31:47 PM By: Gretta Cool, BSN, RN, CWS, Kim RN, BSN Entered By: Gretta Cool, BSN, RN, CWS, Kim on 02/03/2017 14:02:19 [name] ([id number]) -------------------------------------------------------------------------------- Multi-Disciplinary Care Plan  Details Patient Name: [name]. Date of Service: 02/03/2017 1:30 PM Medical Record Number: [id number] Patient Account Number: [id number] Date of Birth/Sex: 06/12/1930 (84 y.o. Male) Treating RN: Cornell Barman Primary Care Ian Cavey: Gaynelle Arabian Other Clinician: Referring Lasha Echeverria: Gaynelle Arabian Treating Odus Clasby/Extender: Melburn Hake, HOYT Weeks in Treatment: 6 Active Inactive ` Orientation to the Wound Care Program Nursing Diagnoses: Knowledge deficit related to the wound healing center program Goals: Patient/caregiver will verbalize understanding of the [facility] Date Initiated: 12/22/2016 Target Resolution Date: 01/12/2017 Goal Status: Active Interventions: Provide education on orientation to the wound center Notes: ` Wound/Skin Impairment Nursing Diagnoses: Impaired tissue integrity Knowledge deficit related to ulceration/compromised skin integrity Goals: Patient/caregiver will verbalize understanding of skin care regimen Date Initiated: 12/22/2016 Target Resolution Date: 01/12/2017 Goal Status: Active Ulcer/skin breakdown will have a volume reduction of 30% by week 4 Date Initiated: 12/22/2016 Target Resolution Date: 01/12/2017 Goal Status: Active Interventions: Assess patient/caregiver ability to obtain necessary supplies Assess ulceration(s) every visit Treatment Activities: Skin care regimen initiated : 12/22/2016 Notes: Electronic Signature(s) Signed: 02/03/2017 5:31:47 PM By: Gretta Cool, BSN, RN, CWS, Kim RN, BSN Hanna, Jeoffrey Massed ([id number]) Entered By: Gretta Cool, BSN, RN, CWS, Kim on 02/03/2017 14:02:12 [name] ([id number]) -------------------------------------------------------------------------------- Pain Assessment Details Patient Name: EDGEL, DEGNAN. Date of Service: 02/03/2017 1:30 PM Medical Record Number: [id number] Patient Account Number: [id number] Date of Birth/Sex: 03-May-1930 (84 y.o. Male) Treating RN: Cornell Barman Primary Care  Graysyn Bache: Gaynelle Arabian Other Clinician: Referring Tyree Fluharty: Gaynelle Arabian Treating Karey Stucki/Extender: Melburn Hake, HOYT Weeks in Treatment: 6 Active Problems Location of Pain Severity and Description of Pain Patient Has Paino No Site Locations With Dressing Change: No Pain Management and Medication Current Pain Management: Goals for Pain Management Topical or injectable lidocaine is offered to patient for acute pain when surgical debridement is performed. If needed, Patient is instructed to use over the counter pain medication for the following 24-48 hours after debridement. Wound care MDs do not prescribed pain medications. Patient has chronic pain or uncontrolled pain. Patient has been instructed to make an appointment with their Primary Care Physician for pain management. Electronic Signature(s) Signed: 02/03/2017 5:31:47 PM By: Gretta Cool, BSN, RN, CWS, Kim RN, BSN Entered By: Gretta Cool, BSN, RN, CWS, Kim on 02/03/2017 13:55:03 [name] ([id number]) -------------------------------------------------------------------------------- Patient/Caregiver Education Details Patient Name: PERCELL, LAMBOY Date of Service: 02/03/2017 1:30 PM Medical Record Number: [id number] Patient Account Number: [id number] Date of Birth/Gender: July 30, 1930 (84 y.o. Male) Treating RN: Cornell Barman Primary Care Physician: Gaynelle Arabian Other Clinician:  Referring Physician: Gaynelle Arabian Treating Physician/Extender: Sharalyn Ink in Treatment: 6 Education Assessment Education Provided To: Patient Education Topics Provided Wound/Skin Impairment: Handouts: Caring for Your Ulcer Methods: Demonstration, Explain/Verbal Responses: State content correctly Electronic Signature(s) Signed: 02/03/2017 5:31:47 PM By: Gretta Cool, BSN, RN, CWS, Kim RN, BSN Entered By: Gretta Cool, BSN, RN, CWS, Kim on 02/03/2017 14:38:56 [name]  ([id number]) -------------------------------------------------------------------------------- Wound Assessment Details Patient Name: TATSUO, MUSIAL. Date of Service: 02/03/2017 1:30 PM Medical Record Number: [id number] Patient Account Number: [id number] Date of Birth/Sex: 08-07-1930 (84 y.o. Male) Treating RN: Cornell Barman Primary Care Serayah Yazdani: Gaynelle Arabian Other Clinician: Referring Jacilyn Sanpedro: Gaynelle Arabian Treating Khaylee Mcevoy/Extender: Melburn Hake, HOYT Weeks in Treatment: 6 Wound Status Wound Number: 1 Primary Etiology: Venous Leg Ulcer Wound Location: Left Lower Leg - Midline, Posterior Wound Status: Open Wounding Event: Gradually Appeared Comorbid History: Arrhythmia Date Acquired: 12/01/2016 Weeks Of Treatment: 6 Clustered Wound: No Photos Wound Measurements Length: (cm) 2 Width: (cm) 3 Depth: (cm) 0.2 Area: (cm) 4.712 Volume: (cm) 0.942 % Reduction in Area: -300% % Reduction in Volume: -698.3% Epithelialization: None Tunneling: No Undermining: No Wound Description Full Thickness Without Exposed Support Classification: Structures Wound Margin: Distinct, outline attached Exudate Large Amount: Exudate Type: Serous Exudate Color: amber Foul Odor After Cleansing: No Slough/Fibrino Yes Wound Bed Granulation Amount: Medium (34-66%) Exposed Structure Granulation Quality: Red Fat Layer (Subcutaneous Tissue) Exposed: Yes Necrotic Amount: Medium (34-66%) Necrotic Quality: Adherent Slough Periwound Skin Texture Texture Color No Abnormalities Noted: No No Abnormalities Noted: No Callus: No Atrophie Blanche: No Crepitus: No Cyanosis: No Excoriation: No Ecchymosis: Yes NIAL, HAWE ([id number]) Induration: Yes Erythema: No Rash: No Hemosiderin Staining: No Scarring: Yes Mottled: No Pallor: No Moisture Rubor: No No Abnormalities Noted: No Dry / Scaly: No Temperature / Pain Maceration: No Temperature: No Abnormality Tenderness on Palpation:  Yes Wound Preparation Ulcer Cleansing: Rinsed/Irrigated with Saline Topical Anesthetic Applied: Other: lidocaine 4%, Treatment Notes Wound #1 (Left, Midline, Posterior Lower Leg) 1. Cleansed with: Clean wound with Normal Saline 2. Anesthetic Topical Lidocaine 4% cream to wound bed prior to debridement 4. Dressing Applied: Other dressing (specify in notes) 5. Secondary Dressing Applied Bordered Foam Dressing 7. Secured with Patient to wear own compression stockings Notes [employer] Electronic Signature(s) Signed: 02/03/2017 5:31:47 PM By: Gretta Cool, BSN, RN, CWS, Kim RN, BSN Entered By: Gretta Cool, BSN, RN, CWS, Kim on 02/03/2017 14:01:05 [name] ([id number]) -------------------------------------------------------------------------------- Vitals Details Patient Name: [name] Date of Service: 02/03/2017 1:30 PM Medical Record Number: [id number] Patient Account Number: [id number] Date of Birth/Sex: 1930-02-19 (84 y.o. Male) Treating RN: Cornell Barman Primary Care Arrion Burruel: Gaynelle Arabian Other Clinician: Referring Demi Trieu: Gaynelle Arabian Treating Juliany Daughety/Extender: Melburn Hake, HOYT Weeks in Treatment: 6 Vital Signs Time Taken: 13:55 Temperature (F): 98.2 Height (in): 72 Pulse (bpm): 75 Weight (lbs): 217 Respiratory Rate (breaths/min): 16 Body Mass Index (BMI): 29.4 Blood Pressure (mmHg): 136/65 Reference Range: 80 - 120 mg / dl Electronic Signature(s) Signed: 02/03/2017 5:31:47 PM By: Gretta Cool, BSN, RN, CWS, Kim RN, BSN Entered By: Gretta Cool, BSN, RN, CWS, Kim on 02/03/2017 13:56:02

## 2017-02-13 ENCOUNTER — Encounter: Payer: Medicare Other | Attending: Physician Assistant | Admitting: Physician Assistant

## 2017-02-13 DIAGNOSIS — L97222 Non-pressure chronic ulcer of left calf with fat layer exposed: Secondary | ICD-10-CM | POA: Insufficient documentation

## 2017-02-13 DIAGNOSIS — I890 Lymphedema, not elsewhere classified: Secondary | ICD-10-CM | POA: Insufficient documentation

## 2017-02-13 DIAGNOSIS — Z87891 Personal history of nicotine dependence: Secondary | ICD-10-CM | POA: Diagnosis not present

## 2017-02-13 DIAGNOSIS — Z96641 Presence of right artificial hip joint: Secondary | ICD-10-CM | POA: Diagnosis not present

## 2017-02-13 DIAGNOSIS — I10 Essential (primary) hypertension: Secondary | ICD-10-CM | POA: Insufficient documentation

## 2017-02-14 NOTE — Progress Notes (Signed)
TIEGAN, TERPSTRA ([id number]) Visit Report for 02/13/2017 Chief Complaint Document Details Patient Name: [name], [name]. Date of Service: 02/13/2017 12:30 PM Medical Record Number: [id number] Patient Account Number: [id number] Date of Birth/Sex: [birth year] (84 y.o. Male) Treating RN: Ahmed Prima Primary Care Provider: Gaynelle Arabian Other Clinician: Referring Provider: Gaynelle Arabian Treating Provider/Extender: Melburn Hake, HOYT Weeks in Treatment: 7 Information Obtained from: Patient Chief Complaint He is seen for follow up evaluation of lle ulcers Electronic Signature(s) Signed: 02/13/2017 4:49:26 PM By: Worthy Keeler PA-C Entered By: Worthy Keeler on 02/13/2017 12:54:42 [name] ([id number]) -------------------------------------------------------------------------------- Debridement Details Patient Name: [name] Date of Service: 02/13/2017 12:30 PM Medical Record Number: [id number] Patient Account Number: [id number] Date of Birth/Sex: 03-13-1930 (84 y.o. Male) Treating RN: Ahmed Prima Primary Care Provider: Gaynelle Arabian Other Clinician: Referring Provider: Gaynelle Arabian Treating Provider/Extender: Melburn Hake, HOYT Weeks in Treatment: 7 Debridement Performed for Wound #1 Left,Midline,Posterior Lower Leg Assessment: Performed By: Physician STONE III, HOYT E., PA-C Debridement: Debridement Severity of Tissue Pre Fat layer exposed Debridement: Pre-procedure Verification/Time Yes - 13:02 Out Taken: Start Time: 13:03 Pain Control: Lidocaine 4% Topical Solution Level: Skin/Subcutaneous Tissue Total Area Debrided (L x W): 2.2 (cm) x 3.4 (cm) = 7.48 (cm) Tissue and other material Viable, Non-Viable, Exudate, Fibrin/Slough, Subcutaneous debrided: Instrument: Curette Bleeding: Minimum Hemostasis Achieved: Pressure End Time: 13:06 Procedural Pain: 0 Post Procedural Pain: 0 Response to Treatment: Procedure was tolerated well Post Debridement Measurements  of Total Wound Length: (cm) 2.2 Width: (cm) 3.4 Depth: (cm) 0.3 Volume: (cm) 1.762 Character of Wound/Ulcer Post Debridement: Requires Further Debridement Severity of Tissue Post Debridement: Fat layer exposed Post Procedure Diagnosis Same as Pre-procedure Electronic Signature(s) Signed: 02/13/2017 4:26:47 PM By: Alric Quan Signed: 02/13/2017 4:49:26 PM By: Worthy Keeler PA-C Entered By: Alric Quan on 02/13/2017 13:04:40 [name] ([id number]) -------------------------------------------------------------------------------- HPI Details Patient Name: [name] Date of Service: 02/13/2017 12:30 PM Medical Record Number: [id number] Patient Account Number: [id number] Date of Birth/Sex: September 05, 1930 (84 y.o. Male) Treating RN: Ahmed Prima Primary Care Provider: Gaynelle Arabian Other Clinician: Referring Provider: Gaynelle Arabian Treating Provider/Extender: Melburn Hake, HOYT Weeks in Treatment: 7 History of Present Illness Location: left lower extremity swelling with ulceration on the posterior calf Modifying Factors: Other treatment(s) tried include:and seen his PCP who thought there was cellulitis and infection and put him on doxycycline for 2 weeks HPI Description: 84 year old gentleman has been referred to [place] by his PCP Dr. Gaynelle Arabian, for a ulcer on the left posterior lower leg which has been there for about 6 weeks. The patient was thought to have an infection and was put on doxycycline for 14 days and referred to the wound center. He has a past medical history of paroxysmal atrial fibrillation, hypertension, diverticulosis, status post right hip replacement, appendectomy. He has not been a smoker since 33. the anterior shin wound has been more recent about 2 weeks and this was caused probably due to a blunt abrasion. 02/13/17 on evaluation patient appears to be doing well in regard to the appearance of his wound. He has been tolerating the dressing changes  without complication and I do believe the silver alginate has been of benefit for him. Overall the wound measurements have not greatly improved although he does have granulation tissue in the center's which seems to be starting to really fill in which is great news. Electronic Signature(s) Signed: 02/13/2017 4:49:26 PM By: Worthy Keeler PA-C Entered By: Worthy Keeler on 02/13/2017 13:22:42  SID, GREENER ([id number]) -------------------------------------------------------------------------------- Physical Exam Details Patient Name: [name]. Date of Service: 02/13/2017 12:30 PM Medical Record Number: [id number] Patient Account Number: [id number] Date of Birth/Sex: [birth year] (84 y.o. Male) Treating RN: Ahmed Prima Primary Care Provider: Gaynelle Arabian Other Clinician: Referring Provider: Gaynelle Arabian Treating Provider/Extender: STONE III, HOYT Weeks in Treatment: 7 Constitutional Well-nourished and well-hydrated in no acute distress. Respiratory normal breathing without difficulty. Cardiovascular 1+ pitting edema of the bilateral lower extremities. Psychiatric this patient is able to make decisions and demonstrates good insight into disease process. Alert and Oriented x 3. pleasant and cooperative. Notes Wound bed appear to show evidence of slough covering the surface although there did not appear to be any evidence of infection. This did require sharp debridement and patient tolerated this well today without complication. Post debridement the wound appear to be doing much better. Electronic Signature(s) Signed: 02/13/2017 4:49:26 PM By: Worthy Keeler PA-C Entered By: Worthy Keeler on 02/13/2017 13:23:42 [name] ([id number]) -------------------------------------------------------------------------------- Physician Orders Details Patient Name: [name] Date of Service: 02/13/2017 12:30 PM Medical Record Number: [id number] Patient Account Number:  [id number] Date of Birth/Sex: March 10, 1930 (84 y.o. Male) Treating RN: Ahmed Prima Primary Care Provider: Gaynelle Arabian Other Clinician: Referring Provider: Gaynelle Arabian Treating Provider/Extender: Melburn Hake, HOYT Weeks in Treatment: 7 Verbal / Phone Orders: Yes Clinician: Carolyne Fiscal, Debi Read Back and Verified: Yes Diagnosis Coding ICD-10 Coding Code Description I89.0 Lymphedema, not elsewhere classified S81.812A Laceration without foreign body, left lower leg, initial encounter L97.222 Non-pressure chronic ulcer of left calf with fat layer exposed L97.221 Non-pressure chronic ulcer of left calf limited to breakdown of skin Wound Cleansing Wound #1 Left,Midline,Posterior Lower Leg o Clean wound with Normal Saline. o Cleanse wound with mild soap and water o May Shower, gently pat wound dry prior to applying new dressing. Anesthetic (add to Medication List) Wound #1 Left,Midline,Posterior Lower Leg o Topical Lidocaine 4% cream applied to wound bed prior to debridement (In Clinic Only). Skin Barriers/Peri-Wound Care Wound #1 Left,Midline,Posterior Lower Leg o Skin Prep Primary Wound Dressing Wound #1 Left,Midline,Posterior Lower Leg o Silvercel Non-Adherent Secondary Dressing Wound #1 Left,Midline,Posterior Lower Leg o Boardered Foam Dressing Dressing Change Frequency Wound #1 Left,Midline,Posterior Lower Leg o Change dressing every other day. Follow-up Appointments o Return Appointment in 1 week. Edema Control Wound #1 Left,Midline,Posterior Lower Leg o Patient to wear own compression stockings Additional Orders / Instructions BOBBI, KOZAKIEWICZ ([id number]) Wound #1 Left,Midline,Posterior Lower Leg o Increase protein intake. Patient Medications Allergies: No Known Drug Allergies Notifications Medication Indication Start End lidocaine DOSE 1 - topical 4 % cream - 1 cream topical Electronic Signature(s) Signed: 02/13/2017 4:26:47 PM By:  Alric Quan Signed: 02/13/2017 4:49:26 PM By: Worthy Keeler PA-C Entered By: Alric Quan on 02/13/2017 13:03:06 [name] ([id number]) -------------------------------------------------------------------------------- Prescription 02/13/2017 Patient Name: [name] Provider: Worthy Keeler PA-C Date of Birth: 1930-01-14 NPI#: [id number] Sex: M DEA#: [id number] Phone #: [phone] License #: Patient Address: [facility] [place], [place] [address] [address], [place], [place] [address] [phone] Allergies No Known Drug Allergies Medication Medication: Route: Strength: Form: lidocaine topical 4% cream Class: TOPICAL LOCAL ANESTHETICS Dose: Frequency / Time: Indication: 1 1 cream topical Number of Refills: Number of Units: 0 Generic Substitution: Start Date: End Date: Administered at Substitution Permitted Facility: Yes Time Administered: Time Discontinued: Note to Pharmacy: Signature(s): Date(s): Electronic Signature(s) Signed: 02/13/2017 4:26:47 PM By:  Alric Quan Signed: 02/13/2017 4:49:26 PM By: Worthy Keeler PA-C Entered By: Alric Quan on 02/13/2017 13:03:06 HARON, BEILKE ([id number] Mcdonald ([id number]) --------------------------------------------------------------------------------  Problem List Details Patient Name: ORLIN, KANN Date of Service: 02/13/2017 12:30 PM Medical Record Number: [id number] Patient Account Number: [id number] Date of Birth/Sex: Jan 12, 1930 (84 y.o. Male) Treating RN: Ahmed Prima Primary Care Provider: Gaynelle Arabian Other Clinician: Referring Provider: Gaynelle Arabian Treating Provider/Extender: Melburn Hake, HOYT Weeks in Treatment: 7 Active Problems ICD-10 Encounter Code Description Active Date Diagnosis I89.0 Lymphedema, not elsewhere classified 12/22/2016 Yes Y86.578I Laceration without  foreign body, left lower leg, initial encounter 12/22/2016 Yes L97.222 Non-pressure chronic ulcer of left calf with fat layer exposed 12/22/2016 Yes L97.221 Non-pressure chronic ulcer of left calf limited to breakdown of skin 12/22/2016 Yes Inactive Problems Resolved Problems Electronic Signature(s) Signed: 02/13/2017 4:49:26 PM By: Worthy Keeler PA-C Entered By: Worthy Keeler on 02/13/2017 12:54:31 [name] ([id number]) -------------------------------------------------------------------------------- Progress Note Details Patient Name: [name] Date of Service: 02/13/2017 12:30 PM Medical Record Number: [id number] Patient Account Number: [id number] Date of Birth/Sex: 04-05-1930 (84 y.o. Male) Treating RN: Ahmed Prima Primary Care Provider: Gaynelle Arabian Other Clinician: Referring Provider: Gaynelle Arabian Treating Provider/Extender: Melburn Hake, HOYT Weeks in Treatment: 7 Subjective Chief Complaint Information obtained from Patient He is seen for follow up evaluation of lle ulcers History of Present Illness (HPI) The following HPI elements were documented for the patient's wound: Location: left lower extremity swelling with ulceration on the posterior calf Modifying Factors: Other treatment(s) tried include:and seen his PCP who thought there was cellulitis and infection and put him on doxycycline for 41 weeks 84 year old gentleman has been referred to [place] by his PCP Dr. Gaynelle Arabian, for a ulcer on the left posterior lower leg which has been there for about 6 weeks. The patient was thought to have an infection and was put on doxycycline for 14 days and referred to the wound center. He has a past medical history of paroxysmal atrial fibrillation, hypertension, diverticulosis, status post right hip replacement, appendectomy. He has not been a smoker since 89. the anterior shin wound has been more recent about 2 weeks and this was caused probably due to a blunt  abrasion. 02/13/17 on evaluation patient appears to be doing well in regard to the appearance of his wound. He has been tolerating the dressing changes without complication and I do believe the silver alginate has been of benefit for him. Overall the wound measurements have not greatly improved although he does have granulation tissue in the center's which seems to be starting to really fill in which is great news. Patient History Information obtained from Patient. Family History Diabetes - Child, Hypertension - Child, No family history of Cancer, Heart Disease, Kidney Disease, Lung Disease, Seizures, Stroke, Thyroid Problems, Tuberculosis. Social History Former smoker - 16 years +, Marital Status - Married, Alcohol Use - Never, Drug Use - No History, Caffeine Use - Rarely. Review of Systems (ROS) Constitutional Symptoms (General Health) Denies complaints or symptoms of Fever, Chills. Respiratory The patient has no complaints or symptoms. Cardiovascular Complains or has symptoms of LE edema. Psychiatric The patient has no complaints or symptoms. DIETRICK, BARRIS ([id number]) Objective Constitutional Well-nourished and well-hydrated in no acute distress. Vitals Time Taken: 12:41 PM, Height: 72 in, Weight: 217 lbs, BMI: 29.4, Temperature: 97.8 F, Pulse: 86 bpm, Respiratory Rate: 16 breaths/min, Blood Pressure: 136/61 mmHg. Respiratory normal breathing without difficulty. Cardiovascular 1+ pitting edema of the bilateral  lower extremities. Psychiatric this patient is able to make decisions and demonstrates good insight into disease process. Alert and Oriented x 3. pleasant and cooperative. General Notes: Wound bed appear to show evidence of slough covering the surface although there did not appear to be any evidence of infection. This did require sharp debridement and patient tolerated this well today without complication. Post debridement the wound appear to be doing much  better. Integumentary (Hair, Skin) Wound #1 status is Open. Original cause of wound was Gradually Appeared. The wound is located on the Left,Midline,Posterior Lower Leg. The wound measures 2.2cm length x 3.4cm width x 0.2cm depth; 5.875cm^2 area and 1.175cm^3 volume. There is Fat Layer (Subcutaneous Tissue) Exposed exposed. There is no tunneling or undermining noted. There is a large amount of serosanguineous drainage noted. The wound margin is distinct with the outline attached to the wound base. There is medium (34-66%) red granulation within the wound bed. There is a medium (34-66%) amount of necrotic tissue within the wound bed including Adherent Slough. The periwound skin appearance exhibited: Induration, Scarring, Ecchymosis. The periwound skin appearance did not exhibit: Callus, Crepitus, Excoriation, Rash, Dry/Scaly, Maceration, Atrophie Blanche, Cyanosis, Hemosiderin Staining, Mottled, Pallor, Rubor, Erythema. Periwound temperature was noted as No Abnormality. The periwound has tenderness on palpation. Assessment Active Problems ICD-10 I89.0 - Lymphedema, not elsewhere classified S81.812A - Laceration without foreign body, left lower leg, initial encounter L97.222 - Non-pressure chronic ulcer of left calf with fat layer exposed L97.221 - Non-pressure chronic ulcer of left calf limited to breakdown of skin OLAOLUWA, GRIEDER. 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 There was a Skin/Subcutaneous Tissue Debridement ([id number]) debridement with total area of 7.48 sq cm performed by STONE III, HOYT E., PA-C. with the following instrument(s): Curette to remove Viable and Non-Viable tissue/material including Exudate, Fibrin/Slough, and Subcutaneous after achieving pain control using Lidocaine 4% Topical Solution. A time out was conducted  at 13:02, prior to the start of the procedure. A Minimum amount of bleeding was controlled with Pressure. The procedure was tolerated well with a pain level of 0 throughout and a pain level of 0 following the procedure. Post Debridement Measurements: 2.2cm length x 3.4cm width x 0.3cm depth; 1.762cm^3 volume. Character of Wound/Ulcer Post Debridement requires further debridement. Severity of Tissue Post Debridement is: Fat layer exposed. Post procedure Diagnosis Wound #1: Same as Pre-Procedure Plan Wound Cleansing: Wound #1 Left,Midline,Posterior Lower Leg: Clean wound with Normal Saline. Cleanse wound with mild soap and water May Shower, gently pat wound dry prior to applying new dressing. Anesthetic (add to Medication List): Wound #1 Left,Midline,Posterior Lower Leg: Topical Lidocaine 4% cream applied to wound bed prior to debridement (In Clinic Only). Skin Barriers/Peri-Wound Care: Wound #1 Left,Midline,Posterior Lower Leg: Skin Prep Primary Wound Dressing: Wound #1 Left,Midline,Posterior Lower Leg: Silvercel Non-Adherent Secondary Dressing: Wound #1 Left,Midline,Posterior Lower Leg: Boardered Foam Dressing Dressing Change Frequency: Wound #1 Left,Midline,Posterior Lower Leg: Change dressing every other day. Follow-up Appointments: Return Appointment in 1 week. Edema Control: Wound #1 Left,Midline,Posterior Lower Leg: Patient to wear own compression stockings Additional Orders / Instructions: Wound #1 Left,Midline,Posterior Lower Leg: Increase protein intake. The following medication(s) was prescribed: lidocaine topical 4 % cream 1 1 cream topical was prescribed at facility ZYMIERE, TROSTLE. ([id number]) I'm going to recommend that we continue with the Current wound care measures for the next week. Patient is in agreement  with this plan. Hopefully he will continue to show signs of improvement in regard to the appearance of the wound which really has made progress even though  the measurements don't necessarily speak to this fully. He definitely has a much healthier appearing wound. We will see were things stand in one week. Please see above for specific wound care orders. We will see patient for re-evaluation in 1 week(s) here in the clinic. If anything worsens or changes patient will contact our office for additional recommendations. Electronic Signature(s) Signed: 02/13/2017 4:49:26 PM By: Worthy Keeler PA-C Entered By: Worthy Keeler on 02/13/2017 13:24:33 [name] ([id number]) -------------------------------------------------------------------------------- ROS/PFSH Details Patient Name: [name] Date of Service: 02/13/2017 12:30 PM Medical Record Number: [id number] Patient Account Number: [id number] Date of Birth/Sex: Sep 07, 1930 (83 y.o. Male) Treating RN: Ahmed Prima Primary Care Provider: Gaynelle Arabian Other Clinician: Referring Provider: Gaynelle Arabian Treating Provider/Extender: Melburn Hake, HOYT Weeks in Treatment: 7 Information Obtained From Patient Wound History Do you currently have one or more open woundso Yes How many open wounds do you currently haveo 2 Approximately how long have you had your woundso 4 weeks How have you been treating your wound(s) until nowo neosporin Has your wound(s) ever healed and then re-openedo No Have you had any lab work done in the past montho No Have you tested positive for an antibiotic resistant organism (MRSA, VRE)o No Have you tested positive for osteomyelitis (bone infection)o No Have you had any tests for circulation on your legso No Constitutional Symptoms (General Health) Complaints and Symptoms: Negative for: Fever; Chills Cardiovascular Complaints and Symptoms: Positive for: LE edema Medical History: Positive for: Arrhythmia - A- Fib Negative for: Angina; Congestive Heart Failure; Coronary Artery Disease; Deep Vein Thrombosis; Hypertension; Hypotension; Myocardial Infarction;  Peripheral Arterial Disease; Peripheral Venous Disease; Phlebitis; Vasculitis Eyes Medical History: Negative for: Cataracts; Glaucoma; Optic Neuritis Ear/Nose/Mouth/Throat Medical History: Negative for: Chronic sinus problems/congestion; Middle ear problems Hematologic/Lymphatic Medical History: Negative for: Anemia; Hemophilia; Human Immunodeficiency Virus; Lymphedema; Sickle Cell Disease Respiratory Complaints and Symptoms: No Complaints or Symptoms Medical History: Negative for: Aspiration; Asthma; Chronic Obstructive Pulmonary Disease (COPD); Pneumothorax; Sleep Apnea; DEQUON, SCHNEBLY ([id number]) Tuberculosis Gastrointestinal Medical History: Negative for: Cirrhosis ; Colitis; Crohnos; Hepatitis A; Hepatitis B; Hepatitis C Endocrine Medical History: Negative for: Type I Diabetes; Type II Diabetes Genitourinary Medical History: Negative for: End Stage Renal Disease Immunological Medical History: Negative for: Lupus Erythematosus; Raynaudos Integumentary (Skin) Medical History: Negative for: History of Burn; History of pressure wounds Musculoskeletal Medical History: Negative for: Gout; Rheumatoid Arthritis; Osteoarthritis; Osteomyelitis Neurologic Medical History: Negative for: Dementia; Neuropathy; Quadriplegia; Paraplegia; Seizure Disorder Psychiatric Complaints and Symptoms: No Complaints or Symptoms Medical History: Negative for: Anorexia/bulimia; Confinement Anxiety Immunizations Pneumococcal Vaccine: Received Pneumococcal Vaccination: No Implantable Devices Family and Social History Cancer: No; Diabetes: Yes - Child; Heart Disease: No; Hypertension: Yes - Child; Kidney Disease: No; Lung Disease: No; Seizures: No; Stroke: No; Thyroid Problems: No; Tuberculosis: No; Former smoker - 19 years +; Marital Status - Married; Alcohol Use: Never; Drug Use: No History; Caffeine Use: Rarely; Financial Concerns: No; Food, Clothing or Shelter Needs: No; Support System  Lacking: No; Transportation Concerns: No; Advanced Directives: No; Patient does not want information on Advanced Directives; Do not resuscitate: No; Living Will: No; Medical Power of Attorney: No Physician Affirmation IANN, RODIER ([id number]) I have reviewed and agree with the above information. Electronic Signature(s) Signed: 02/13/2017 4:26:47 PM By: Alric Quan Signed: 02/13/2017 4:49:26 PM By: Worthy Keeler PA-C  Entered By: Worthy Keeler on 02/13/2017 13:23:08 [name] ([id number]) -------------------------------------------------------------------------------- SuperBill Details Patient Name: [name] Date of Service: 02/13/2017 Medical Record Number: [id number] Patient Account Number: [id number] Date of Birth/Sex: 1930/03/01 (84 y.o. Male) Treating RN: Ahmed Prima Primary Care Provider: Gaynelle Arabian Other Clinician: Referring Provider: Gaynelle Arabian Treating Provider/Extender: Melburn Hake, HOYT Weeks in Treatment: 7 Diagnosis Coding ICD-10 Codes Code Description I89.0 Lymphedema, not elsewhere classified S81.812A Laceration without foreign body, left lower leg, initial encounter L97.222 Non-pressure chronic ulcer of left calf with fat layer exposed L97.221 Non-pressure chronic ulcer of left calf limited to breakdown of skin Facility Procedures CPT4 Code: [id number] Description: [place] - DEB SUBQ TISSUE 20 SQ CM/< ICD-10 Diagnosis Description L97.222 Non-pressure chronic ulcer of left calf with fat layer expo Modifier: sed Quantity: 1 Physician Procedures CPT4 Code: [id number] Description: 39688 - WC PHYS SUBQ TISS 20 SQ CM ICD-10 Diagnosis Description L97.222 Non-pressure chronic ulcer of left calf with fat layer expo Modifier: sed Quantity: 1 Electronic Signature(s) Signed: 02/13/2017 4:49:26 PM By: Worthy Keeler PA-C Entered By: Worthy Keeler on 02/13/2017 13:24:47

## 2017-02-16 NOTE — Progress Notes (Signed)
[name], [name] ([id number]) Visit Report for 02/13/2017 Arrival Information Details Patient Name: [name]. Date of Service: 02/13/2017 12:30 PM Medical Record Number: [id number] Patient Account Number: [id number] Date of Birth/Sex: 18-Apr-1930 (84 y.o. Male) Treating RN: Ahmed Prima Primary Care Masai Kidd: Gaynelle Arabian Other Clinician: Referring Naimah Yingst: Gaynelle Arabian Treating Anne-Marie Genson/Extender: Melburn Hake, HOYT Weeks in Treatment: 7 Visit Information History Since Last Visit All ordered tests and consults were completed: No Patient Arrived: Ambulatory Added or deleted any medications: No Arrival Time: 12:38 Any new allergies or adverse reactions: No Accompanied By: wife Had a fall or experienced change in No Transfer Assistance: None activities of daily living that may affect Patient Identification Verified: Yes risk of falls: Secondary Verification Process Completed: Yes Signs or symptoms of abuse/neglect since last visito No Patient Requires Transmission-Based No Hospitalized since last visit: No Precautions: Has Dressing in Place as Prescribed: Yes Patient Has Alerts: No Pain Present Now: Yes Electronic Signature(s) Signed: 02/13/2017 4:26:47 PM By: Alric Quan Entered By: Alric Quan on 02/13/2017 12:39:29 [name] ([id number]) -------------------------------------------------------------------------------- Encounter Discharge Information Details Patient Name: [name] Date of Service: 02/13/2017 12:30 PM Medical Record Number: [id number] Patient Account Number: [id number] Date of Birth/Sex: July 04, 1930 (84 y.o. Male) Treating RN: Ahmed Prima Primary Care Othar Curto: Gaynelle Arabian Other Clinician: Referring Johnthan Axtman: Gaynelle Arabian Treating Branch Pacitti/Extender: Melburn Hake, HOYT Weeks in Treatment: 7 Encounter Discharge Information Items Discharge Pain Level: 0 Discharge Condition: Stable Ambulatory Status: Ambulatory Discharge  Destination: Home Transportation: Private Auto Accompanied By: self Schedule Follow-up Appointment: Yes Medication Reconciliation completed and No provided to Patient/Care Jeriann Sayres: Provided on Clinical Summary of Care: 02/13/2017 Form Type Recipient Paper Patient AN Electronic Signature(s) Signed: 02/15/2017 9:47:07 AM By: Ruthine Dose Entered By: Ruthine Dose on 02/13/2017 13:13:07 [name] ([id number]) -------------------------------------------------------------------------------- Lower Extremity Assessment Details Patient Name: [name] Date of Service: 02/13/2017 12:30 PM Medical Record Number: [id number] Patient Account Number: [id number] Date of Birth/Sex: 1930-04-13 (84 y.o. Male) Treating RN: Ahmed Prima Primary Care Jerrick Farve: Gaynelle Arabian Other Clinician: Referring Hibba Schram: Gaynelle Arabian Treating Ailyne Pawley/Extender: Melburn Hake, HOYT Weeks in Treatment: 7 Vascular Assessment Pulses: Dorsalis Pedis Palpable: [Left:Yes] Posterior Tibial Extremity colors, hair growth, and conditions: Extremity Color: [Left:Hyperpigmented] Temperature of Extremity: [Left:Warm] Capillary Refill: [Left:< 3 seconds] Toe Nail Assessment Left: Right: Thick: Yes Discolored: Yes Deformed: Yes Improper Length and Hygiene: Yes Electronic Signature(s) Signed: 02/13/2017 4:26:47 PM By: Alric Quan Entered By: Alric Quan on 02/13/2017 12:47:24 [name] ([id number]) -------------------------------------------------------------------------------- Multi Wound Chart Details Patient Name: [name] Date of Service: 02/13/2017 12:30 PM Medical Record Number: [id number] Patient Account Number: [id number] Date of Birth/Sex: Feb 12, 1930 (84 y.o. Male) Treating RN: Ahmed Prima Primary Care Alphonsa Brickle: Gaynelle Arabian Other Clinician: Referring Stephone Gum: Gaynelle Arabian Treating Allante Beane/Extender: Melburn Hake, HOYT Weeks in Treatment: 7 Vital Signs Height(in):  72 Pulse(bpm): 86 Weight(lbs): 217 Blood Pressure(mmHg): 136/61 Body Mass Index(BMI): 29 Temperature(F): 97.8 Respiratory Rate 16 (breaths/min): Photos: [1:No Photos] [N/A:N/A] Wound Location: [1:Left Lower Leg - Midline, Posterior] [N/A:N/A] Wounding Event: [1:Gradually Appeared] [N/A:N/A] Primary Etiology: [1:Venous Leg Ulcer] [N/A:N/A] Comorbid History: [1:Arrhythmia] [N/A:N/A] Date Acquired: [1:12/01/2016] [N/A:N/A] Weeks of Treatment: [1:7] [N/A:N/A] Wound Status: [1:Open] [N/A:N/A] Measurements L x W x D [1:2.2x3.4x0.2] [N/A:N/A] (cm) Area (cm) : [1:5.875] [N/A:N/A] Volume (cm) : [1:1.175] [N/A:N/A] % Reduction in Area: [1:-398.70%] [N/A:N/A] % Reduction in Volume: [1:-895.80%] [N/A:N/A] Classification: [1:Full Thickness Without Exposed Support Structures] [N/A:N/A] Exudate Amount: [1:Large] [N/A:N/A] Exudate Type: [1:Serosanguineous] [N/A:N/A] Exudate Color: [1:red, brown] [N/A:N/A] Wound Margin: [1:Distinct, outline attached] [N/A:N/A]  Granulation Amount: [1:Medium (34-66%)] [N/A:N/A] Granulation Quality: [1:Red] [N/A:N/A] Necrotic Amount: [1:Medium (34-66%)] [N/A:N/A] Exposed Structures: [1:Fat Layer (Subcutaneous Tissue) Exposed: Yes] [N/A:N/A] Epithelialization: [1:None] [N/A:N/A] Periwound Skin Texture: [1:Induration: Yes Scarring: Yes Excoriation: No Callus: No Crepitus: No Rash: No] [N/A:N/A] Periwound Skin Moisture: [1:Maceration: No Dry/Scaly: No] [N/A:N/A] Periwound Skin Color: [1:Ecchymosis: Yes Atrophie Blanche: No] [N/A:N/A] Cyanosis: No Erythema: No Hemosiderin Staining: No Mottled: No Pallor: No Rubor: No Temperature: No Abnormality N/A N/A Tenderness on Palpation: Yes N/A N/A Wound Preparation: Ulcer Cleansing: N/A N/A Rinsed/Irrigated with Saline Topical Anesthetic Applied: Other: lidocaine 4% Treatment Notes Electronic Signature(s) Signed: 02/13/2017 4:26:47 PM By: Alric Quan Entered By: Alric Quan on 02/13/2017  12:47:34 [name] ([id number]) -------------------------------------------------------------------------------- Multi-Disciplinary Care Plan Details Patient Name: [name] Date of Service: 02/13/2017 12:30 PM Medical Record Number: [id number] Patient Account Number: [id number] Date of Birth/Sex: 1930/02/23 (84 y.o. Male) Treating RN: Ahmed Prima Primary Care Damarea Merkel: Gaynelle Arabian Other Clinician: Referring Marris Frontera: Gaynelle Arabian Treating Claudeen Leason/Extender: Melburn Hake, HOYT Weeks in Treatment: 7 Active Inactive ` Orientation to the Wound Care Program Nursing Diagnoses: Knowledge deficit related to the wound healing center program Goals: Patient/caregiver will verbalize understanding of the [place] Program Date Initiated: 12/22/2016 Target Resolution Date: 01/12/2017 Goal Status: Active Interventions: Provide education on orientation to the wound center Notes: ` Wound/Skin Impairment Nursing Diagnoses: Impaired tissue integrity Knowledge deficit related to ulceration/compromised skin integrity Goals: Patient/caregiver will verbalize understanding of skin care regimen Date Initiated: 12/22/2016 Target Resolution Date: 01/12/2017 Goal Status: Active Ulcer/skin breakdown will have a volume reduction of 30% by week 4 Date Initiated: 12/22/2016 Target Resolution Date: 01/12/2017 Goal Status: Active Interventions: Assess patient/caregiver ability to obtain necessary supplies Assess ulceration(s) every visit Treatment Activities: Skin care regimen initiated : 12/22/2016 Notes: Electronic Signature(s) Signed: 02/13/2017 4:26:47 PM By: Uvaldo Rising ([id number]) Entered By: Alric Quan on 02/13/2017 12:47:28 [name] ([id number]) -------------------------------------------------------------------------------- Pain Assessment Details Patient Name: [name] Date of Service: 02/13/2017 12:30 PM Medical Record Number:  [id number] Patient Account Number: [id number] Date of Birth/Sex: 1930-08-03 (84 y.o. Male) Treating RN: Ahmed Prima Primary Care Fread Kottke: Gaynelle Arabian Other Clinician: Referring Taralee Marcus: Gaynelle Arabian Treating Tanyia Grabbe/Extender: Melburn Hake, HOYT Weeks in Treatment: 7 Active Problems Location of Pain Severity and Description of Pain Patient Has Paino Yes Site Locations Rate the pain. Current Pain Level: 4 Character of Pain Describe the Pain: Aching Pain Management and Medication Current Pain Management: Notes Topical or injectable lidocaine is offered to patient for acute pain when surgical debridement is performed. If needed, Patient is instructed to use over the counter pain medication for the following 24-48 hours after debridement. Wound care MDs do not prescribed pain medications. Patient has chronic pain or uncontrolled pain. Patient has been instructed to make an appointment with their Primary Care Physician for pain management. Electronic Signature(s) Signed: 02/13/2017 4:26:47 PM By: Alric Quan Entered By: Alric Quan on 02/13/2017 12:40:12 [name] ([id number]) -------------------------------------------------------------------------------- Patient/Caregiver Education Details Patient Name: [name] Date of Service: 02/13/2017 12:30 PM Medical Record Number: [id number] Patient Account Number: [id number] Date of Birth/Gender: 17-Sep-1930 (84 y.o. Male) Treating RN: Ahmed Prima Primary Care Physician: Gaynelle Arabian Other Clinician: Referring Physician: Gaynelle Arabian Treating Physician/Extender: Sharalyn Ink in Treatment: 7 Education Assessment Education Provided To: Patient Education Topics Provided Wound/Skin Impairment: Handouts: Caring for Your Ulcer, Other: change dressing as ordered Methods: Demonstration, Explain/Verbal Responses: State content correctly Electronic Signature(s) Signed: 02/13/2017 4:26:47 PM By:  Alric Quan Entered  By: Alric Quan on 02/13/2017 13:01:23 [name] ([id number]) -------------------------------------------------------------------------------- Wound Assessment Details Patient Name: [name], [name]. Date of Service: 02/13/2017 12:30 PM Medical Record Number: [id number] Patient Account Number: [id number] Date of Birth/Sex: 08-31-1930 (84 y.o. Male) Treating RN: Ahmed Prima Primary Care Celise Bazar: Gaynelle Arabian Other Clinician: Referring Perfecto Purdy: Gaynelle Arabian Treating Alexxa Sabet/Extender: Melburn Hake, HOYT Weeks in Treatment: 7 Wound Status Wound Number: 1 Primary Etiology: Venous Leg Ulcer Wound Location: Left Lower Leg - Midline, Posterior Wound Status: Open Wounding Event: Gradually Appeared Comorbid History: Arrhythmia Date Acquired: 12/01/2016 Weeks Of Treatment: 7 Clustered Wound: No Photos Photo Uploaded By: Alric Quan on 02/13/2017 16:23:51 Wound Measurements Length: (cm) 2.2 Width: (cm) 3.4 Depth: (cm) 0.2 Area: (cm) 5.875 Volume: (cm) 1.175 % Reduction in Area: -398.7% % Reduction in Volume: -895.8% Epithelialization: None Tunneling: No Undermining: No Wound Description Full Thickness Without Exposed Support Classification: Structures Wound Margin: Distinct, outline attached Exudate Large Amount: Exudate Type: Serosanguineous Exudate Color: red, brown Foul Odor After Cleansing: No Slough/Fibrino Yes Wound Bed Granulation Amount: Medium (34-66%) Exposed Structure Granulation Quality: Red Fat Layer (Subcutaneous Tissue) Exposed: Yes Necrotic Amount: Medium (34-66%) Necrotic Quality: Adherent Slough Periwound Skin Texture Texture Color No Abnormalities Noted: No No Abnormalities Noted: No [name], [name]. ([id number]) Callus: No Atrophie Blanche: No Crepitus: No Cyanosis: No Excoriation: No Ecchymosis: Yes Induration: Yes Erythema: No Rash: No Hemosiderin Staining: No Scarring: Yes Mottled:  No Pallor: No Moisture Rubor: No No Abnormalities Noted: No Dry / Scaly: No Temperature / Pain Maceration: No Temperature: No Abnormality Tenderness on Palpation: Yes Wound Preparation Ulcer Cleansing: Rinsed/Irrigated with Saline Topical Anesthetic Applied: Other: lidocaine 4%, Treatment Notes Wound #1 (Left, Midline, Posterior Lower Leg) 1. Cleansed with: Clean wound with Normal Saline 2. Anesthetic Topical Lidocaine 4% cream to wound bed prior to debridement 3. Peri-wound Care: Skin Prep 4. Dressing Applied: Other dressing (specify in notes) 5. Secondary Dressing Applied Bordered Foam Dressing Notes silvercel Electronic Signature(s) Signed: 02/13/2017 4:26:47 PM By: Alric Quan Entered By: Alric Quan on 02/13/2017 12:46:41 [name] ([id number]) -------------------------------------------------------------------------------- Vitals Details Patient Name: [name] Date of Service: 02/13/2017 12:30 PM Medical Record Number: [id number] Patient Account Number: [id number] Date of Birth/Sex: 30-Jan-1930 (84 y.o. Male) Treating RN: Ahmed Prima Primary Care Paislei Dorval: Gaynelle Arabian Other Clinician: Referring Antero Derosia: Gaynelle Arabian Treating Nicodemus Denk/Extender: Melburn Hake, HOYT Weeks in Treatment: 7 Vital Signs Time Taken: 12:41 Temperature (F): 97.8 Height (in): 72 Pulse (bpm): 86 Weight (lbs): 217 Respiratory Rate (breaths/min): 16 Body Mass Index (BMI): 29.4 Blood Pressure (mmHg): 136/61 Reference Range: 80 - 120 mg / dl Electronic Signature(s) Signed: 02/13/2017 4:26:47 PM By: Alric Quan Entered By: Alric Quan on 02/13/2017 12:42:08

## 2017-02-20 ENCOUNTER — Encounter: Payer: Medicare Other | Admitting: Physician Assistant

## 2017-02-20 DIAGNOSIS — L97222 Non-pressure chronic ulcer of left calf with fat layer exposed: Secondary | ICD-10-CM | POA: Diagnosis not present

## 2017-02-22 NOTE — Progress Notes (Addendum)
SON, BARKAN ([id number]) Visit Report for 02/20/2017 Chief Complaint Document Details Patient Name: [name], [name]. Date of Service: 02/20/2017 12:30 PM Medical Record Number: [id number] Patient Account Number: [id number] Date of Birth/Sex: 22-Feb-1930 (84 y.o. Male) Treating RN: Ahmed Prima Primary Care Provider: Gaynelle Arabian Other Clinician: Referring Provider: Gaynelle Arabian Treating Provider/Extender: Melburn Hake, HOYT Weeks in Treatment: 8 Information Obtained from: Patient Chief Complaint He is seen for follow up evaluation of lle ulcers Electronic Signature(s) Signed: 02/20/2017 5:25:11 PM By: Worthy Keeler PA-C Entered By: Worthy Keeler on 02/20/2017 13:07:18 [name] ([id number]) -------------------------------------------------------------------------------- HPI Details Patient Name: [name] Date of Service: 02/20/2017 12:30 PM Medical Record Number: [id number] Patient Account Number: [id number] Date of Birth/Sex: February 26, 1930 (84 y.o. Male) Treating RN: Ahmed Prima Primary Care Provider: Gaynelle Arabian Other Clinician: Referring Provider: Gaynelle Arabian Treating Provider/Extender: Melburn Hake, HOYT Weeks in Treatment: 8 History of Present Illness Location: left lower extremity swelling with ulceration on the posterior calf Modifying Factors: Other treatment(s) tried include:and seen his PCP who thought there was cellulitis and infection and put him on doxycycline for 2 weeks HPI Description: 84 year old gentleman has been referred to [place] by his PCP Dr. Gaynelle Arabian, for a ulcer on the left posterior lower leg which has been there for about 6 weeks. The patient was thought to have an infection and was put on doxycycline for 14 days and referred to the wound center. He has a past medical history of paroxysmal atrial fibrillation, hypertension, diverticulosis, status post right hip replacement, appendectomy. He has not been a smoker since 55. the  anterior shin wound has been more recent about 2 weeks and this was caused probably due to a blunt abrasion. 02/13/17 on evaluation patient appears to be doing well in regard to the appearance of his wound. He has been tolerating the dressing changes without complication and I do believe the silver alginate has been of benefit for him. Overall the wound measurements have not greatly improved although he does have granulation tissue in the center's which seems to be starting to really fill in which is great news. 02/20/17 on evaluation today patient appears to be doing excellent at this point. I'm extremely pleased with were things stand currently. In fact his wound does not even appear to require debridement today and he has great granulation noted in the base of the wound. He is not having as much discomfort which is also a very good sign. There's no evidence of infection. Electronic Signature(s) Signed: 02/20/2017 5:25:11 PM By: Worthy Keeler PA-C Entered By: Worthy Keeler on 02/20/2017 13:14:51 [name] ([id number]) -------------------------------------------------------------------------------- Physical Exam Details Patient Name: [name] Date of Service: 02/20/2017 12:30 PM Medical Record Number: [id number] Patient Account Number: [id number] Date of Birth/Sex: 08-04-1930 (84 y.o. Male) Treating RN: Ahmed Prima Primary Care Provider: Gaynelle Arabian Other Clinician: Referring Provider: Gaynelle Arabian Treating Provider/Extender: STONE III, HOYT Weeks in Treatment: 8 Constitutional Well-nourished and well-hydrated in no acute distress. Respiratory normal breathing without difficulty. clear to auscultation bilaterally. Cardiovascular regular rate and rhythm with normal S1, S2. Psychiatric this patient is able to make decisions and demonstrates good insight into disease process. Alert and Oriented x 3. pleasant and cooperative. Notes After cleaning with saline and  gauze patient's wound bed appears to show no evidence of significant slough buildup at this point. In fact the wound bed appears to be extremely clean there is some fascia showing but fortunately there seems to be a significant  amount of granulation occurring in around this area budding in. Electronic Signature(s) Signed: 02/20/2017 5:25:11 PM By: Worthy Keeler PA-C Entered By: Worthy Keeler on 02/20/2017 13:15:42 [name] ([id number]) -------------------------------------------------------------------------------- Physician Orders Details Patient Name: [name] Date of Service: 02/20/2017 12:30 PM Medical Record Number: [id number] Patient Account Number: [id number] Date of Birth/Sex: 1930-10-15 (84 y.o. Male) Treating RN: Ahmed Prima Primary Care Provider: Gaynelle Arabian Other Clinician: Referring Provider: Gaynelle Arabian Treating Provider/Extender: Melburn Hake, HOYT Weeks in Treatment: 8 Verbal / Phone Orders: Yes ClinicianCarolyne Fiscal, Debi Read Back and Verified: Yes Diagnosis Coding ICD-10 Coding Code Description I89.0 Lymphedema, not elsewhere classified S81.812A Laceration without foreign body, left lower leg, initial encounter L97.222 Non-pressure chronic ulcer of left calf with fat layer exposed L97.221 Non-pressure chronic ulcer of left calf limited to breakdown of skin Wound Cleansing Wound #1 Left,Midline,Posterior Lower Leg o Clean wound with Normal Saline. o Cleanse wound with mild soap and water o May Shower, gently pat wound dry prior to applying new dressing. Anesthetic (add to Medication List) Wound #1 Left,Midline,Posterior Lower Leg o Topical Lidocaine 4% cream applied to wound bed prior to debridement (In Clinic Only). Skin Barriers/Peri-Wound Care Wound #1 Left,Midline,Posterior Lower Leg o Skin Prep Primary Wound Dressing Wound #1 Left,Midline,Posterior Lower Leg o Silvercel Non-Adherent Secondary Dressing Wound #1  Left,Midline,Posterior Lower Leg o Boardered Foam Dressing Dressing Change Frequency Wound #1 Left,Midline,Posterior Lower Leg o Change dressing every other day. Follow-up Appointments o Return Appointment in 2 weeks. Edema Control Wound #1 Left,Midline,Posterior Lower Leg o Patient to wear own compression stockings Additional Orders / Instructions CAROLS, CLEMENCE ([id number]) Wound #1 Left,Midline,Posterior Lower Leg o Increase protein intake. Patient Medications Allergies: No Known Drug Allergies Notifications Medication Indication Start End lidocaine DOSE 1 - topical 4 % cream - 1 cream topical Electronic Signature(s) Signed: 02/20/2017 5:25:11 PM By: Worthy Keeler PA-C Signed: 02/21/2017 4:46:02 PM By: Alric Quan Entered By: Alric Quan on 02/20/2017 13:17:15 CYLE, KENYON ([id number]) -------------------------------------------------------------------------------- Prescription 02/20/2017 Patient Name: [name] Provider: Worthy Keeler PA-C Date of Birth: 1930-11-26 NPI#: [id number] Sex: M DEA#: [id number] Phone #: [phone] License #: Patient Address: [facility] [place], [place] [address] [address] [place], [place] [address] [phone] Allergies No Known Drug Allergies Medication Medication: Route: Strength: Form: lidocaine 4 % topical cream topical 4% cream Class: TOPICAL LOCAL ANESTHETICS Dose: Frequency / Time: Indication: 1 1 cream topical Number of Refills: Number of Units: 0 Generic Substitution: Start Date: End Date: One Time Use: Substitution Permitted No Note to Pharmacy: Signature(s): Date(s): Electronic Signature(s) Signed: 02/20/2017 5:25:11 PM By: Worthy Keeler PA-C Signed: 02/21/2017 4:46:02 PM By: Alric Quan Entered By: Alric Quan on 02/20/2017 13:17:16 [name]  ([id number]) --------------------------------------------------------------------------------  Problem List Details Patient Name: [name] Date of Service: 02/20/2017 12:30 PM Medical Record Number: [id number] Patient Account Number: [id number] Date of Birth/Sex: Jun 07, 1930 (84 y.o. Male) Treating RN: Ahmed Prima Primary Care Provider: Gaynelle Arabian Other Clinician: Referring Provider: Gaynelle Arabian Treating Provider/Extender: Melburn Hake, HOYT Weeks in Treatment: 8 Active Problems ICD-10 Encounter Code Description Active Date Diagnosis I89.0 Lymphedema, not elsewhere classified 12/22/2016 Yes P23.300T Laceration without foreign body, left lower leg, initial encounter 12/22/2016 Yes L97.222 Non-pressure chronic ulcer of left calf with fat layer exposed 12/22/2016 Yes L97.221 Non-pressure chronic ulcer of left calf limited to breakdown of skin 12/22/2016 Yes Inactive Problems Resolved Problems Electronic Signature(s) Signed:  02/20/2017 5:25:11 PM By: Worthy Keeler PA-C Entered By: Worthy Keeler on 02/20/2017 13:07:11 [name] ([id number]) -------------------------------------------------------------------------------- Progress Note Details Patient Name: [name] Date of Service: 02/20/2017 12:30 PM Medical Record Number: [id number] Patient Account Number: [id number] Date of Birth/Sex: [birth year] (84 y.o. Male) Treating RN: Ahmed Prima Primary Care Provider: Gaynelle Arabian Other Clinician: Referring Provider: Gaynelle Arabian Treating Provider/Extender: Melburn Hake, HOYT Weeks in Treatment: 8 Subjective Chief Complaint Information obtained from Patient He is seen for follow up evaluation of lle ulcers History of Present Illness (HPI) The following HPI elements were documented for the patient's wound: Location: left lower extremity swelling with ulceration on the posterior calf Modifying Factors: Other treatment(s) tried include:and seen his PCP who  thought there was cellulitis and infection and put him on doxycycline for 33 weeks 84 year old gentleman has been referred to [place] by his PCP Dr. Gaynelle Arabian, for a ulcer on the left posterior lower leg which has been there for about 6 weeks. The patient was thought to have an infection and was put on doxycycline for 14 days and referred to the wound center. He has a past medical history of paroxysmal atrial fibrillation, hypertension, diverticulosis, status post right hip replacement, appendectomy. He has not been a smoker since 77. the anterior shin wound has been more recent about 2 weeks and this was caused probably due to a blunt abrasion. 02/13/17 on evaluation patient appears to be doing well in regard to the appearance of his wound. He has been tolerating the dressing changes without complication and I do believe the silver alginate has been of benefit for him. Overall the wound measurements have not greatly improved although he does have granulation tissue in the center's which seems to be starting to really fill in which is great news. 02/20/17 on evaluation today patient appears to be doing excellent at this point. I'm extremely pleased with were things stand currently. In fact his wound does not even appear to require debridement today and he has great granulation noted in the base of the wound. He is not having as much discomfort which is also a very good sign. There's no evidence of infection. Patient History Information obtained from Patient. Family History Diabetes - Child, Hypertension - Child, No family history of Cancer, Heart Disease, Kidney Disease, Lung Disease, Seizures, Stroke, Thyroid Problems, Tuberculosis. Social History Former smoker - 44 years +, Marital Status - Married, Alcohol Use - Never, Drug Use - No History, Caffeine Use - Rarely. Review of Systems (ROS) Constitutional Symptoms (General Health) Denies complaints or symptoms of Fever,  Chills. Respiratory The patient has no complaints or symptoms. Cardiovascular The patient has no complaints or symptoms. Psychiatric The patient has no complaints or symptoms. DARE, SANGER ([id number]) Objective Constitutional Well-nourished and well-hydrated in no acute distress. Vitals Time Taken: 12:41 PM, Height: 72 in, Weight: 217 lbs, BMI: 29.4, Temperature: 97.6 F, Pulse: 76 bpm, Respiratory Rate: 16 breaths/min, Blood Pressure: 145/75 mmHg. Respiratory normal breathing without difficulty. clear to auscultation bilaterally. Cardiovascular regular rate and rhythm with normal S1, S2. Psychiatric this patient is able to make decisions and demonstrates good insight into disease process. Alert and Oriented x 3. pleasant and cooperative. General Notes: After cleaning with saline and gauze patient's wound bed appears to show no evidence of significant slough buildup at this point. In fact the wound bed appears to be extremely clean there is some fascia showing but fortunately there seems to be a significant amount of granulation occurring  in around this area budding in. Integumentary (Hair, Skin) Wound #1 status is Open. Original cause of wound was Gradually Appeared. The wound is located on the Left,Midline,Posterior Lower Leg. The wound measures 1.8cm length x 2.7cm width x 0.2cm depth; 3.817cm^2 area and 0.763cm^3 volume. There is Fat Layer (Subcutaneous Tissue) Exposed exposed. There is no tunneling or undermining noted. There is a large amount of serosanguineous drainage noted. The wound margin is distinct with the outline attached to the wound base. There is medium (34-66%) red granulation within the wound bed. There is a medium (34-66%) amount of necrotic tissue within the wound bed including Adherent Slough. The periwound skin appearance exhibited: Induration, Scarring, Maceration, Ecchymosis. The periwound skin appearance did not exhibit: Callus, Crepitus, Excoriation,  Rash, Dry/Scaly, Atrophie Blanche, Cyanosis, Hemosiderin Staining, Mottled, Pallor, Rubor, Erythema. Periwound temperature was noted as No Abnormality. The periwound has tenderness on palpation. Assessment Active Problems ICD-10 I89.0 - Lymphedema, not elsewhere classified S81.812A - Laceration without foreign body, left lower leg, initial encounter L97.222 - Non-pressure chronic ulcer of left calf with fat layer exposed L97.221 - Non-pressure chronic ulcer of left calf limited to breakdown of skin Locker, Carles J. ([id number]) Plan Wound Cleansing: Wound #1 Left,Midline,Posterior Lower Leg: Clean wound with Normal Saline. Cleanse wound with mild soap and water May Shower, gently pat wound dry prior to applying new dressing. Anesthetic (add to Medication List): Wound #1 Left,Midline,Posterior Lower Leg: Topical Lidocaine 4% cream applied to wound bed prior to debridement (In Clinic Only). Skin Barriers/Peri-Wound Care: Wound #1 Left,Midline,Posterior Lower Leg: Skin Prep Primary Wound Dressing: Wound #1 Left,Midline,Posterior Lower Leg: Silvercel Non-Adherent Secondary Dressing: Wound #1 Left,Midline,Posterior Lower Leg: Boardered Foam Dressing Dressing Change Frequency: Wound #1 Left,Midline,Posterior Lower Leg: Change dressing every other day. Follow-up Appointments: Return Appointment in 2 weeks. Edema Control: Wound #1 Left,Midline,Posterior Lower Leg: Patient to wear own compression stockings Additional Orders / Instructions: Wound #1 Left,Midline,Posterior Lower Leg: Increase protein intake. The following medication(s) was prescribed: lidocaine topical 4 % cream 1 1 cream topical was prescribed at facility I'm going to recommend that we continue with the Current wound care measures for the next two weeks. We will see him for reevaluation following. If anything worsens significantly in the interim he will contact our office for additional recommendations. Otherwise I'm  hopeful that this will continue to show signs of good improvement and hopefully will be even more significantly filled in at that point. Patient and his wife are both directed of the discharge instructions today. Electronic Signature(s) Signed: 02/28/2017 10:42:09 AM By: Worthy Keeler PA-C Previous Signature: 02/20/2017 5:25:11 PM Version By: Worthy Keeler PA-C Entered By: Worthy Keeler on 02/28/2017 09:09:17 [name] ([id number]) -------------------------------------------------------------------------------- ROS/PFSH Details Patient Name: [name] Date of Service: 02/20/2017 12:30 PM Medical Record Number: [id number] Patient Account Number: [id number] Date of Birth/Sex: [birth year] (84 y.o. Male) Treating RN: Ahmed Prima Primary Care Provider: Gaynelle Arabian Other Clinician: Referring Provider: Gaynelle Arabian Treating Provider/Extender: Melburn Hake, HOYT Weeks in Treatment: 8 Information Obtained From Patient Wound History Do you currently have one or more open woundso Yes How many open wounds do you currently haveo 2 Approximately how long have you had your woundso 4 weeks How have you been treating your wound(s) until nowo neosporin Has your wound(s) ever healed and then re-openedo No Have you had any lab work done in the past montho No Have you tested positive for an antibiotic resistant organism (MRSA, VRE)o No Have you tested positive for osteomyelitis (  bone infection)o No Have you had any tests for circulation on your legso No Constitutional Symptoms (General Health) Complaints and Symptoms: Negative for: Fever; Chills Eyes Medical History: Negative for: Cataracts; Glaucoma; Optic Neuritis Ear/Nose/Mouth/Throat Medical History: Negative for: Chronic sinus problems/congestion; Middle ear problems Hematologic/Lymphatic Medical History: Negative for: Anemia; Hemophilia; Human Immunodeficiency Virus; Lymphedema; Sickle Cell  Disease Respiratory Complaints and Symptoms: No Complaints or Symptoms Medical History: Negative for: Aspiration; Asthma; Chronic Obstructive Pulmonary Disease (COPD); Pneumothorax; Sleep Apnea; Tuberculosis Cardiovascular Complaints and Symptoms: No Complaints or Symptoms Medical History: Positive for: Arrhythmia - A- Fib Negative for: Angina; Congestive Heart Failure; Coronary Artery Disease; Deep Vein Thrombosis; Hypertension; KELTEN, ENOCHS ([id number]) Hypotension; Myocardial Infarction; Peripheral Arterial Disease; Peripheral Venous Disease; Phlebitis; Vasculitis Gastrointestinal Medical History: Negative for: Cirrhosis ; Colitis; Crohnos; Hepatitis A; Hepatitis B; Hepatitis C Endocrine Medical History: Negative for: Type I Diabetes; Type II Diabetes Genitourinary Medical History: Negative for: End Stage Renal Disease Immunological Medical History: Negative for: Lupus Erythematosus; Raynaudos Integumentary (Skin) Medical History: Negative for: History of Burn; History of pressure wounds Musculoskeletal Medical History: Negative for: Gout; Rheumatoid Arthritis; Osteoarthritis; Osteomyelitis Neurologic Medical History: Negative for: Dementia; Neuropathy; Quadriplegia; Paraplegia; Seizure Disorder Psychiatric Complaints and Symptoms: No Complaints or Symptoms Medical History: Negative for: Anorexia/bulimia; Confinement Anxiety Immunizations Pneumococcal Vaccine: Received Pneumococcal Vaccination: No Implantable Devices Family and Social History Cancer: No; Diabetes: Yes - Child; Heart Disease: No; Hypertension: Yes - Child; Kidney Disease: No; Lung Disease: No; Seizures: No; Stroke: No; Thyroid Problems: No; Tuberculosis: No; Former smoker - 56 years +; Marital Status - Married; Alcohol Use: Never; Drug Use: No History; Caffeine Use: Rarely; Financial Concerns: No; Food, Clothing or Shelter Needs: No; Support System Lacking: No; Transportation Concerns: No;  Advanced Directives: No; Patient does not want information on Advanced Directives; Do not resuscitate: No; Living Will: No; Medical Power of Attorney: No Physician Affirmation TORION, HULGAN ([id number]) I have reviewed and agree with the above information. Electronic Signature(s) Signed: 02/20/2017 5:25:11 PM By: Worthy Keeler PA-C Signed: 02/21/2017 4:46:02 PM By: Alric Quan Entered By: Worthy Keeler on 02/20/2017 13:15:17 [name] ([id number]) -------------------------------------------------------------------------------- SuperBill Details Patient Name: [name] Date of Service: 02/20/2017 Medical Record Number: [id number] Patient Account Number: [id number] Date of Birth/Sex: [birth year] (84 y.o. Male) Treating RN: Ahmed Prima Primary Care Provider: Gaynelle Arabian Other Clinician: Referring Provider: Gaynelle Arabian Treating Provider/Extender: Melburn Hake, HOYT Weeks in Treatment: 8 Diagnosis Coding ICD-10 Codes Code Description I89.0 Lymphedema, not elsewhere classified S81.812A Laceration without foreign body, left lower leg, initial encounter L97.222 Non-pressure chronic ulcer of left calf with fat layer exposed L97.221 Non-pressure chronic ulcer of left calf limited to breakdown of skin Facility Procedures CPT4 Code: [id number] Description: 99213 - WOUND CARE VISIT-LEV 3 EST PT Modifier: Quantity: 1 Physician Procedures CPT4 Code: 3016010 Description: 93235 - WC PHYS LEVEL 3 - EST PT ICD-10 Diagnosis Description I89.0 Lymphedema, not elsewhere classified S81.812A Laceration without foreign body, left lower leg, initial enc L97.222 Non-pressure chronic ulcer of left calf with fat layer  expos L97.221 Non-pressure chronic ulcer of left calf limited to breakdown Modifier: ounter ed of skin Quantity: 1 Electronic Signature(s) Signed: 02/20/2017 4:37:20 PM By: Alric Quan Signed: 02/20/2017 5:25:11 PM By: Worthy Keeler PA-C Entered By: Alric Quan on 02/20/2017 16:37:19

## 2017-02-22 NOTE — Progress Notes (Signed)
NAASIR, CARREIRA ([id number]) Visit Report for 02/20/2017 Arrival Information Details Patient Name: [name], [name]. Date of Service: 02/20/2017 12:30 PM Medical Record Number: [id number] Patient Account Number: [id number] Date of Birth/Sex: 1930-10-15 (84 y.o. Male) Treating RN: Ahmed Prima Primary Care Areli Jowett: Gaynelle Arabian Other Clinician: Referring Ritamarie Arkin: Gaynelle Arabian Treating Nowell Sites/Extender: Melburn Hake, HOYT Weeks in Treatment: 8 Visit Information History Since Last Visit All ordered tests and consults were completed: No Patient Arrived: Ambulatory Added or deleted any medications: No Arrival Time: 12:37 Any new allergies or adverse reactions: No Accompanied By: wife Had a fall or experienced change in No Transfer Assistance: None activities of daily living that may affect Patient Identification Verified: Yes risk of falls: Secondary Verification Process Completed: Yes Signs or symptoms of abuse/neglect since last visito No Patient Requires Transmission-Based No Hospitalized since last visit: No Precautions: Has Dressing in Place as Prescribed: Yes Patient Has Alerts: No Pain Present Now: No Electronic Signature(s) Signed: 02/21/2017 4:46:02 PM By: Alric Quan Entered By: Alric Quan on 02/20/2017 12:37:58 [name] ([id number]) -------------------------------------------------------------------------------- Clinic Level of Care Assessment Details Patient Name: [name] Date of Service: 02/20/2017 12:30 PM Medical Record Number: [id number] Patient Account Number: [id number] Date of Birth/Sex: 11/25/1930 (84 y.o. Male) Treating RN: Ahmed Prima Primary Care Gildo Crisco: Gaynelle Arabian Other Clinician: Referring Latravion Graves: Gaynelle Arabian Treating Zulay Corrie/Extender: Melburn Hake, HOYT Weeks in Treatment: 8 Clinic Level of Care Assessment Items TOOL 4 Quantity Score X - Use when only an EandM is performed on FOLLOW-UP visit 1 0 ASSESSMENTS -  Nursing Assessment / Reassessment X - Reassessment of Co-morbidities (includes updates in patient status) 1 10 X- 1 5 Reassessment of Adherence to Treatment Plan ASSESSMENTS - Wound and Skin Assessment / Reassessment X - Simple Wound Assessment / Reassessment - one wound 1 5 []  - 0 Complex Wound Assessment / Reassessment - multiple wounds []  - 0 Dermatologic / Skin Assessment (not related to wound area) ASSESSMENTS - Focused Assessment []  - Circumferential Edema Measurements - multi extremities 0 []  - 0 Nutritional Assessment / Counseling / Intervention []  - 0 Lower Extremity Assessment (monofilament, tuning fork, pulses) []  - 0 Peripheral Arterial Disease Assessment (using hand held doppler) ASSESSMENTS - Ostomy and/or Continence Assessment and Care []  - Incontinence Assessment and Management 0 []  - 0 Ostomy Care Assessment and Management (repouching, etc.) PROCESS - Coordination of Care X - Simple Patient / Family Education for ongoing care 1 15 []  - 0 Complex (extensive) Patient / Family Education for ongoing care []  - 0 Staff obtains Programmer, systems, Records, Test Results / Process Orders []  - 0 Staff telephones HHA, Nursing Homes / Clarify orders / etc []  - 0 Routine Transfer to another Facility (non-emergent condition) []  - 0 Routine Hospital Admission (non-emergent condition) []  - 0 New Admissions / Biomedical engineer / Ordering NPWT, Apligraf, etc. []  - 0 Emergency Hospital Admission (emergent condition) X- 1 10 Simple Discharge Coordination ENOCH, MOFFA ([id number]) []  - 0 Complex (extensive) Discharge Coordination PROCESS - Special Needs []  - Pediatric / Minor Patient Management 0 []  - 0 Isolation Patient Management []  - 0 Hearing / Language / Visual special needs []  - 0 Assessment of Community assistance (transportation, D/C planning, etc.) []  - 0 Additional assistance / Altered mentation []  - 0 Support Surface(s) Assessment (bed, cushion, seat,  etc.) INTERVENTIONS - Wound Cleansing / Measurement X - Simple Wound Cleansing - one wound 1 5 []  - 0 Complex Wound Cleansing - multiple wounds X- 1 5 Wound Imaging (photographs -  any number of wounds) []  - 0 Wound Tracing (instead of photographs) X- 1 5 Simple Wound Measurement - one wound []  - 0 Complex Wound Measurement - multiple wounds INTERVENTIONS - Wound Dressings X - Small Wound Dressing one or multiple wounds 1 10 []  - 0 Medium Wound Dressing one or multiple wounds []  - 0 Large Wound Dressing one or multiple wounds X- 1 5 Application of Medications - topical []  - 0 Application of Medications - injection INTERVENTIONS - Miscellaneous []  - External ear exam 0 []  - 0 Specimen Collection (cultures, biopsies, blood, body fluids, etc.) []  - 0 Specimen(s) / Culture(s) sent or taken to Lab for analysis []  - 0 Patient Transfer (multiple staff / Civil Service fast streamer / Similar devices) []  - 0 Simple Staple / Suture removal (25 or less) []  - 0 Complex Staple / Suture removal (26 or more) []  - 0 Hypo / Hyperglycemic Management (close monitor of Blood Glucose) []  - 0 Ankle / Brachial Index (ABI) - do not check if billed separately X- 1 5 Vital Signs Basulto, Brannan J. ([id number]) Has the patient been seen at the hospital within the last three years: Yes Total Score: 80 Level Of Care: New/Established - Level 3 Electronic Signature(s) Signed: 02/21/2017 4:46:02 PM By: Alric Quan Entered By: Alric Quan on 02/20/2017 [place], Jered J. ([id number]) -------------------------------------------------------------------------------- Encounter Discharge Information Details Patient Name: [name] Date of Service: 02/20/2017 12:30 PM Medical Record Number: [id number] Patient Account Number: [id number] Date of Birth/Sex: Nov 15, 1930 (84 y.o. Male) Treating RN: Ahmed Prima Primary Care Carrissa Taitano: Gaynelle Arabian Other Clinician: Referring Ronney Honeywell: Gaynelle Arabian Treating Brelynn Wheller/Extender: Melburn Hake, HOYT Weeks in Treatment: 8 Encounter Discharge Information Items Discharge Pain Level: 0 Discharge Condition: Stable Ambulatory Status: Ambulatory Discharge Destination: Home Private Transportation: Auto Accompanied By: wife Schedule Follow-up Appointment: Yes Medication Reconciliation completed and provided No to Patient/Care Peirce Deveney: Clinical Summary of Care: Electronic Signature(s) Signed: 02/21/2017 4:46:02 PM By: Alric Quan Entered By: Alric Quan on 02/20/2017 13:08:59 [name] ([id number]) -------------------------------------------------------------------------------- Lower Extremity Assessment Details Patient Name: [name] Date of Service: 02/20/2017 12:30 PM Medical Record Number: [id number] Patient Account Number: [id number] Date of Birth/Sex: 1930/09/09 (84 y.o. Male) Treating RN: Ahmed Prima Primary Care Raizy Auzenne: Gaynelle Arabian Other Clinician: Referring Terell Kincy: Gaynelle Arabian Treating Devonna Oboyle/Extender: Melburn Hake, HOYT Weeks in Treatment: 8 Vascular Assessment Pulses: Dorsalis Pedis Palpable: [Left:Yes] Posterior Tibial Extremity colors, hair growth, and conditions: Extremity Color: [Left:Hyperpigmented] Temperature of Extremity: [Left:Warm] Capillary Refill: [Left:< 3 seconds] Toe Nail Assessment Left: Right: Thick: Yes Discolored: Yes Deformed: Yes Improper Length and Hygiene: Yes Electronic Signature(s) Signed: 02/21/2017 4:46:02 PM By: Alric Quan Entered By: Alric Quan on 02/20/2017 12:49:39 [name] ([id number]) -------------------------------------------------------------------------------- Multi Wound Chart Details Patient Name: [name] Date of Service: 02/20/2017 12:30 PM Medical Record Number: [id number] Patient Account Number: [id number] Date of Birth/Sex: 21-Oct-1930 (84 y.o. Male) Treating RN: Ahmed Prima Primary Care Nyliah Nierenberg:  Gaynelle Arabian Other Clinician: Referring Latanya Hemmer: Gaynelle Arabian Treating Malikhi Ogan/Extender: Melburn Hake, HOYT Weeks in Treatment: 8 Vital Signs Height(in): 72 Pulse(bpm): 76 Weight(lbs): 217 Blood Pressure(mmHg): 145/75 Body Mass Index(BMI): 29 Temperature(F): 97.6 Respiratory Rate 16 (breaths/min): Photos: [1:No Photos] [N/A:N/A] Wound Location: [1:Left Lower Leg - Midline, Posterior] [N/A:N/A] Wounding Event: [1:Gradually Appeared] [N/A:N/A] Primary Etiology: [1:Venous Leg Ulcer] [N/A:N/A] Comorbid History: [1:Arrhythmia] [N/A:N/A] Date Acquired: [1:12/01/2016] [N/A:N/A] Weeks of Treatment: [1:8] [N/A:N/A] Wound Status: [1:Open] [N/A:N/A] Measurements L x W x D [1:1.8x2.7x0.2] [N/A:N/A] (cm) Area (cm) : [1:3.817] [N/A:N/A] Volume (cm) : [1:0.763] [N/A:N/A] %  Reduction in Area: [1:-224.00%] [N/A:N/A] % Reduction in Volume: [1:-546.60%] [N/A:N/A] Classification: [1:Full Thickness Without Exposed Support Structures] [N/A:N/A] Exudate Amount: [1:Large] [N/A:N/A] Exudate Type: [1:Serosanguineous] [N/A:N/A] Exudate Color: [1:red, brown] [N/A:N/A] Wound Margin: [1:Distinct, outline attached] [N/A:N/A] Granulation Amount: [1:Medium (34-66%)] [N/A:N/A] Granulation Quality: [1:Red] [N/A:N/A] Necrotic Amount: [1:Medium (34-66%)] [N/A:N/A] Exposed Structures: [1:Fat Layer (Subcutaneous Tissue) Exposed: Yes] [N/A:N/A] Epithelialization: [1:None] [N/A:N/A] Periwound Skin Texture: [1:Induration: Yes Scarring: Yes Excoriation: No Callus: No Crepitus: No Rash: No] [N/A:N/A] Periwound Skin Moisture: [1:Maceration: Yes Dry/Scaly: No] [N/A:N/A] Periwound Skin Color: [1:Ecchymosis: Yes Atrophie Blanche: No] [N/A:N/A] Cyanosis: No Erythema: No Hemosiderin Staining: No Mottled: No Pallor: No Rubor: No Temperature: No Abnormality N/A N/A Tenderness on Palpation: Yes N/A N/A Wound Preparation: Ulcer Cleansing: N/A N/A Rinsed/Irrigated with Saline Topical Anesthetic  Applied: Other: lidocaine 4% Treatment Notes Electronic Signature(s) Signed: 02/21/2017 4:46:02 PM By: Alric Quan Entered By: Alric Quan on 02/20/2017 12:50:21 [name] ([id number]) -------------------------------------------------------------------------------- [place] Details Patient Name: [name] Date of Service: 02/20/2017 12:30 PM Medical Record Number: [id number] Patient Account Number: [id number] Date of Birth/Sex: [birth year] (84 y.o. Male) Treating RN: Ahmed Prima Primary Care Ying Blankenhorn: Gaynelle Arabian Other Clinician: Referring Kaydan Wong: Gaynelle Arabian Treating Karmin Kasprzak/Extender: Melburn Hake, HOYT Weeks in Treatment: 8 Active Inactive ` Orientation to the Wound Care Program Nursing Diagnoses: Knowledge deficit related to the wound healing center program Goals: Patient/caregiver will verbalize understanding of the [place] Program Date Initiated: 12/22/2016 Target Resolution Date: 01/12/2017 Goal Status: Active Interventions: Provide education on orientation to the wound center Notes: ` Wound/Skin Impairment Nursing Diagnoses: Impaired tissue integrity Knowledge deficit related to ulceration/compromised skin integrity Goals: Patient/caregiver will verbalize understanding of skin care regimen Date Initiated: 12/22/2016 Target Resolution Date: 01/12/2017 Goal Status: Active Ulcer/skin breakdown will have a volume reduction of 30% by week 4 Date Initiated: 12/22/2016 Target Resolution Date: 01/12/2017 Goal Status: Active Interventions: Assess patient/caregiver ability to obtain necessary supplies Assess ulceration(s) every visit Treatment Activities: Skin care regimen initiated : 12/22/2016 Notes: Electronic Signature(s) Signed: 02/21/2017 4:46:02 PM By: Uvaldo Rising ([id number]) Entered By: Alric Quan on 02/20/2017 12:50:15 [name]  ([id number]) -------------------------------------------------------------------------------- Pain Assessment Details Patient Name: [name] Date of Service: 02/20/2017 12:30 PM Medical Record Number: [id number] Patient Account Number: [id number] Date of Birth/Sex: [birth year] (84 y.o. Male) Treating RN: Ahmed Prima Primary Care Soleia Badolato: Gaynelle Arabian Other Clinician: Referring Aldeen Riga: Gaynelle Arabian Treating Kerensa Nicklas/Extender: Melburn Hake, HOYT Weeks in Treatment: 8 Active Problems Location of Pain Severity and Description of Pain Patient Has Paino No Site Locations Pain Management and Medication Current Pain Management: Electronic Signature(s) Signed: 02/21/2017 4:46:02 PM By: Alric Quan Entered By: Alric Quan on 02/20/2017 12:38:05 [name] ([id number]) -------------------------------------------------------------------------------- Patient/Caregiver Education Details Patient Name: [name] Date of Service: 02/20/2017 12:30 PM Medical Record Number: [id number] Patient Account Number: [id number] Date of Birth/Gender: April 25, 1930 (84 y.o. Male) Treating RN: Ahmed Prima Primary Care Physician: Gaynelle Arabian Other Clinician: Referring Physician: Gaynelle Arabian Treating Physician/Extender: Sharalyn Ink in Treatment: 8 Education Assessment Education Provided To: Patient Education Topics Provided Wound/Skin Impairment: Handouts: Caring for Your Ulcer, Other: change dressing as ordered Methods: Demonstration, Explain/Verbal Responses: State content correctly Electronic Signature(s) Signed: 02/21/2017 4:46:02 PM By: Alric Quan Entered By: Alric Quan on 02/20/2017 13:09:15 [name] ([id number]) -------------------------------------------------------------------------------- Wound Assessment Details Patient Name: [name] Date of Service: 02/20/2017 12:30 PM Medical Record Number: [id number] Patient Account  Number: [id number] Date of Birth/Sex: [birth year] (84 y.o. Male) Treating RN: Carolyne Fiscal,  Debi Primary Care Davine Coba: Gaynelle Arabian Other Clinician: Referring Zyir Gassert: Gaynelle Arabian Treating Diyari Cherne/Extender: Melburn Hake, HOYT Weeks in Treatment: 8 Wound Status Wound Number: 1 Primary Etiology: Venous Leg Ulcer Wound Location: Left Lower Leg - Midline, Posterior Wound Status: Open Wounding Event: Gradually Appeared Comorbid History: Arrhythmia Date Acquired: 12/01/2016 Weeks Of Treatment: 8 Clustered Wound: No Photos Photo Uploaded By: Alric Quan on 02/21/2017 08:04:46 Wound Measurements Length: (cm) 1.8 Width: (cm) 2.7 Depth: (cm) 0.2 Area: (cm) 3.817 Volume: (cm) 0.763 % Reduction in Area: -224% % Reduction in Volume: -546.6% Epithelialization: None Tunneling: No Undermining: No Wound Description Full Thickness Without Exposed Support Classification: Structures Wound Margin: Distinct, outline attached Exudate Large Amount: Exudate Type: Serosanguineous Exudate Color: red, brown Foul Odor After Cleansing: No Slough/Fibrino Yes Wound Bed Granulation Amount: Medium (34-66%) Exposed Structure Granulation Quality: Red Fat Layer (Subcutaneous Tissue) Exposed: Yes Necrotic Amount: Medium (34-66%) Necrotic Quality: Adherent Slough Periwound Skin Texture Texture Color No Abnormalities Noted: No No Abnormalities Noted: No AMELIA, MACKEN. ([id number]) Callus: No Atrophie Blanche: No Crepitus: No Cyanosis: No Excoriation: No Ecchymosis: Yes Induration: Yes Erythema: No Rash: No Hemosiderin Staining: No Scarring: Yes Mottled: No Pallor: No Moisture Rubor: No No Abnormalities Noted: No Dry / Scaly: No Temperature / Pain Maceration: Yes Temperature: No Abnormality Tenderness on Palpation: Yes Wound Preparation Ulcer Cleansing: Rinsed/Irrigated with Saline Topical Anesthetic Applied: Other: lidocaine 4%, Treatment Notes Wound #1 (Left, Midline,  Posterior Lower Leg) 1. Cleansed with: Clean wound with Normal Saline 2. Anesthetic Topical Lidocaine 4% cream to wound bed prior to debridement 3. Peri-wound Care: Skin Prep 4. Dressing Applied: Other dressing (specify in notes) 5. Secondary Dressing Applied Bordered Foam Dressing Notes silvercel Electronic Signature(s) Signed: 02/21/2017 4:46:02 PM By: Alric Quan Entered By: Alric Quan on 02/20/2017 12:49:13 [name] ([id number]) -------------------------------------------------------------------------------- Vitals Details Patient Name: [name] Date of Service: 02/20/2017 12:30 PM Medical Record Number: [id number] Patient Account Number: [id number] Date of Birth/Sex: 05-04-1930 (84 y.o. Male) Treating RN: Ahmed Prima Primary Care Winson Eichorn: Gaynelle Arabian Other Clinician: Referring Marisa Hage: Gaynelle Arabian Treating Donaldo Teegarden/Extender: Melburn Hake, HOYT Weeks in Treatment: 8 Vital Signs Time Taken: 12:41 Temperature (F): 97.6 Height (in): 72 Pulse (bpm): 76 Weight (lbs): 217 Respiratory Rate (breaths/min): 16 Body Mass Index (BMI): 29.4 Blood Pressure (mmHg): 145/75 Reference Range: 80 - 120 mg / dl Electronic Signature(s) Signed: 02/21/2017 4:46:02 PM By: Alric Quan Entered By: Alric Quan on 02/20/2017 12:41:21

## 2017-02-23 ENCOUNTER — Ambulatory Visit (INDEPENDENT_AMBULATORY_CARE_PROVIDER_SITE_OTHER): Payer: Medicare Other | Admitting: *Deleted

## 2017-02-23 DIAGNOSIS — I6789 Other cerebrovascular disease: Secondary | ICD-10-CM

## 2017-02-23 DIAGNOSIS — Z5181 Encounter for therapeutic drug level monitoring: Secondary | ICD-10-CM

## 2017-02-23 DIAGNOSIS — I4891 Unspecified atrial fibrillation: Secondary | ICD-10-CM

## 2017-02-23 DIAGNOSIS — I63133 Cerebral infarction due to embolism of bilateral carotid arteries: Secondary | ICD-10-CM

## 2017-02-23 LAB — POCT INR: INR: 2.2

## 2017-02-23 NOTE — Patient Instructions (Signed)
Description   Continue taking 1 tablet daily except 1 and 1/2 tablets on Sundays and  Wednesdays.  Recheck in 4 weeks. Call us with any medication changes [phone]

## 2017-03-06 ENCOUNTER — Encounter: Payer: Medicare Other | Admitting: Physician Assistant

## 2017-03-06 DIAGNOSIS — L97222 Non-pressure chronic ulcer of left calf with fat layer exposed: Secondary | ICD-10-CM | POA: Diagnosis not present

## 2017-03-07 NOTE — Progress Notes (Signed)
[name] ([id number]) Visit Report for 03/06/2017 Chief Complaint Document Details Patient Name: [name], [name]. Date of Service: 03/06/2017 10:30 AM Medical Record Number: [id number] Patient Account Number: [id number] Date of Birth/Sex: [birth year] (84 y.o. Male) Treating RN: Roger Shelter Primary Care Provider: Gaynelle Arabian Other Clinician: Referring Provider: Gaynelle Arabian Treating Provider/Extender: Melburn Hake, Shaterria Sager Weeks in Treatment: 10 Information Obtained from: Patient Chief Complaint He is seen for follow up evaluation of lle ulcers Electronic Signature(s) Signed: 03/06/2017 4:51:58 PM By: Worthy Keeler PA-C Entered By: Worthy Keeler on 03/06/2017 10:23:24 [name] ([id number]) -------------------------------------------------------------------------------- HPI Details Patient Name: [name] Date of Service: 03/06/2017 10:30 AM Medical Record Number: [id number] Patient Account Number: [id number] Date of Birth/Sex: 1930/03/17 (84 y.o. Male) Treating RN: Roger Shelter Primary Care Provider: Gaynelle Arabian Other Clinician: Referring Provider: Gaynelle Arabian Treating Provider/Extender: Melburn Hake, Markella Dao Weeks in Treatment: 10 History of Present Illness Location: left lower extremity swelling with ulceration on the posterior calf Modifying Factors: Other treatment(s) tried include:and seen his PCP who thought there was cellulitis and infection and put him on doxycycline for 2 weeks HPI Description: 84 year old gentleman has been referred to [place] by his PCP Dr. Gaynelle Arabian, for a ulcer on the left posterior lower leg which has been there for about 6 weeks. The patient was thought to have an infection and was put on doxycycline for 14 days and referred to the wound center. He has a past medical history of paroxysmal atrial fibrillation, hypertension, diverticulosis, status post right hip replacement, appendectomy. He has not been a smoker since  93. the anterior shin wound has been more recent about 2 weeks and this was caused probably due to a blunt abrasion. 03/06/17 on evaluation today patient's ulcer appears to show signs of excellent improvement. He is definitely showing additional evidence of granulation which is great news. Overall I'm extremely pleased with the progress that he has been making from the beginning to where we are right now. With that being said I would like to potentially try different dressing to see if we could stimulate this to heal even faster. Electronic Signature(s) Signed: 03/06/2017 4:51:58 PM By: Worthy Keeler PA-C Entered By: Worthy Keeler on 03/06/2017 11:14:16 [name] ([id number]) -------------------------------------------------------------------------------- Physical Exam Details Patient Name: [name] Date of Service: 03/06/2017 10:30 AM Medical Record Number: [id number] Patient Account Number: [id number] Date of Birth/Sex: 1930/05/09 (84 y.o. Male) Treating RN: Roger Shelter Primary Care Provider: Gaynelle Arabian Other Clinician: Referring Provider: Gaynelle Arabian Treating Provider/Extender: STONE III, Mikhala Kenan Weeks in Treatment: 43 Constitutional Well-nourished and well-hydrated in no acute distress. Respiratory normal breathing without difficulty. clear to auscultation bilaterally. Cardiovascular regular rate and rhythm with normal S1, S2. Psychiatric this patient is able to make decisions and demonstrates good insight into disease process. Alert and Oriented x 3. pleasant and cooperative. Notes Patient's wound shows excellent granulation in the central portion of the wound. This week by week has been getting larger and though it has been slow at least he has been progressing nicely. He continues to wear his own compression stockings at this point. Electronic Signature(s) Signed: 03/06/2017 4:51:58 PM By: Worthy Keeler PA-C Entered By: Worthy Keeler on 03/06/2017  11:15:05 [name] ([id number]) -------------------------------------------------------------------------------- Physician Orders Details Patient Name: [name] Date of Service: 03/06/2017 10:30 AM Medical Record Number: [id number] Patient Account Number: [id number] Date of Birth/Sex: 11-10-1930 (84 y.o. Male) Treating RN: Roger Shelter Primary Care Provider: Gaynelle Arabian Other Clinician:  Referring Provider: Gaynelle Arabian Treating Provider/Extender: Melburn Hake, Tadashi Burkel Weeks in Treatment: 10 Verbal / Phone Orders: No Diagnosis Coding ICD-10 Coding Code Description I89.0 Lymphedema, not elsewhere classified S81.812A Laceration without foreign body, left lower leg, initial encounter L97.222 Non-pressure chronic ulcer of left calf with fat layer exposed L97.221 Non-pressure chronic ulcer of left calf limited to breakdown of skin Wound Cleansing Wound #1 Left,Midline,Posterior Lower Leg o Clean wound with Normal Saline. o Cleanse wound with mild soap and water o May Shower, gently pat wound dry prior to applying new dressing. Anesthetic (add to Medication List) Wound #1 Left,Midline,Posterior Lower Leg o Topical Lidocaine 4% cream applied to wound bed prior to debridement (In Clinic Only). Skin Barriers/Peri-Wound Care Wound #1 Left,Midline,Posterior Lower Leg o Skin Prep Primary Wound Dressing Wound #1 Left,Midline,Posterior Lower Leg o Prisma Ag - moisten with saline Secondary Dressing Wound #1 Left,Midline,Posterior Lower Leg o Boardered Foam Dressing Dressing Change Frequency Wound #1 Left,Midline,Posterior Lower Leg o Change dressing every other day. Follow-up Appointments o Return Appointment in 2 weeks. Edema Control Wound #1 Left,Midline,Posterior Lower Leg o Patient to wear own compression stockings Additional Orders / Instructions [name], [name] ([id number]) Wound #1 Left,Midline,Posterior Lower Leg o Vitamin A; Vitamin C,  Zinc o Increase protein intake. Electronic Signature(s) Signed: 03/06/2017 4:51:58 PM By: Worthy Keeler PA-C Entered By: Worthy Keeler on 03/06/2017 11:15:20 [name] ([id number]) -------------------------------------------------------------------------------- Problem List Details Patient Name: [name] Date of Service: 03/06/2017 10:30 AM Medical Record Number: [id number] Patient Account Number: [id number] Date of Birth/Sex: 1930/09/09 (84 y.o. Male) Treating RN: Roger Shelter Primary Care Provider: Gaynelle Arabian Other Clinician: Referring Provider: Gaynelle Arabian Treating Provider/Extender: Melburn Hake, Milley Vining Weeks in Treatment: 10 Active Problems ICD-10 Encounter Code Description Active Date Diagnosis I89.0 Lymphedema, not elsewhere classified 12/22/2016 Yes E56.314H Laceration without foreign body, left lower leg, initial encounter 12/22/2016 Yes L97.222 Non-pressure chronic ulcer of left calf with fat layer exposed 12/22/2016 Yes L97.221 Non-pressure chronic ulcer of left calf limited to breakdown of skin 12/22/2016 Yes Inactive Problems Resolved Problems Electronic Signature(s) Signed: 03/06/2017 4:51:58 PM By: Worthy Keeler PA-C Entered By: Worthy Keeler on 03/06/2017 10:22:40 [name] ([id number]) -------------------------------------------------------------------------------- Progress Note Details Patient Name: [name] Date of Service: 03/06/2017 10:30 AM Medical Record Number: [id number] Patient Account Number: [id number] Date of Birth/Sex: 03/29/1930 (84 y.o. Male) Treating RN: Roger Shelter Primary Care Provider: Gaynelle Arabian Other Clinician: Referring Provider: Gaynelle Arabian Treating Provider/Extender: Melburn Hake, Imane Burrough Weeks in Treatment: 10 Subjective Chief Complaint Information obtained from Patient He is seen for follow up evaluation of lle ulcers History of Present Illness (HPI) The following HPI elements were  documented for the patient's wound: Location: left lower extremity swelling with ulceration on the posterior calf Modifying Factors: Other treatment(s) tried include:and seen his PCP who thought there was cellulitis and infection and put him on doxycycline for 36 weeks 84 year old gentleman has been referred to [place] by his PCP Dr. Gaynelle Arabian, for a ulcer on the left posterior lower leg which has been there for about 6 weeks. The patient was thought to have an infection and was put on doxycycline for 14 days and referred to the wound center. He has a past medical history of paroxysmal atrial fibrillation, hypertension, diverticulosis, status post right hip replacement, appendectomy. He has not been a smoker since 89. the anterior shin wound has been more recent about 2 weeks and this was caused probably due to a blunt abrasion. 03/06/17 on evaluation  today patient's ulcer appears to show signs of excellent improvement. He is definitely showing additional evidence of granulation which is great news. Overall I'm extremely pleased with the progress that he has been making from the beginning to where we are right now. With that being said I would like to potentially try different dressing to see if we could stimulate this to heal even faster. Patient History Information obtained from Patient. Family History Diabetes - Child, Hypertension - Child, No family history of Cancer, Heart Disease, Kidney Disease, Lung Disease, Seizures, Stroke, Thyroid Problems, Tuberculosis. Social History Former smoker - 21 years +, Marital Status - Married, Alcohol Use - Never, Drug Use - No History, Caffeine Use - Rarely. Review of Systems (ROS) Constitutional Symptoms (General Health) Denies complaints or symptoms of Fever, Chills. Respiratory The patient has no complaints or symptoms. Cardiovascular Complains or has symptoms of LE edema. Psychiatric The patient has no complaints or symptoms. [name], [name]  ([id number]) Objective Constitutional Well-nourished and well-hydrated in no acute distress. Vitals Time Taken: 10:30 AM, Height: 72 in, Weight: 217 lbs, BMI: 29.4, Temperature: 97.7 F, Pulse: 88 bpm, Respiratory Rate: 16 breaths/min, Blood Pressure: 150/58 mmHg. Respiratory normal breathing without difficulty. clear to auscultation bilaterally. Cardiovascular regular rate and rhythm with normal S1, S2. Psychiatric this patient is able to make decisions and demonstrates good insight into disease process. Alert and Oriented x 3. pleasant and cooperative. General Notes: Patient's wound shows excellent granulation in the central portion of the wound. This week by week has been getting larger and though it has been slow at least he has been progressing nicely. He continues to wear his own compression stockings at this point. Integumentary (Hair, Skin) Wound #1 status is Open. Original cause of wound was Gradually Appeared. The wound is located on the Left,Midline,Posterior Lower Leg. The wound measures 1.4cm length x 2.5cm width x 0.1cm depth; 2.749cm^2 area and 0.275cm^3 volume. There is Fat Layer (Subcutaneous Tissue) Exposed exposed. There is no tunneling or undermining noted. There is a large amount of serosanguineous drainage noted. The wound margin is distinct with the outline attached to the wound base. There is medium (34-66%) red granulation within the wound bed. There is a medium (34-66%) amount of necrotic tissue within the wound bed including Adherent Slough. The periwound skin appearance exhibited: Induration, Scarring, Maceration, Ecchymosis. The periwound skin appearance did not exhibit: Callus, Crepitus, Excoriation, Rash, Dry/Scaly, Atrophie Blanche, Cyanosis, Hemosiderin Staining, Mottled, Pallor, Rubor, Erythema. Periwound temperature was noted as No Abnormality. The periwound has tenderness on palpation. Assessment Active Problems ICD-10 I89.0 - Lymphedema, not elsewhere  classified S81.812A - Laceration without foreign body, left lower leg, initial encounter L97.222 - Non-pressure chronic ulcer of left calf with fat layer exposed L97.221 - Non-pressure chronic ulcer of left calf limited to breakdown of skin [name], Danny J. ([id number]) Plan Wound Cleansing: Wound #1 Left,Midline,Posterior Lower Leg: Clean wound with Normal Saline. Cleanse wound with mild soap and water May Shower, gently pat wound dry prior to applying new dressing. Anesthetic (add to Medication List): Wound #1 Left,Midline,Posterior Lower Leg: Topical Lidocaine 4% cream applied to wound bed prior to debridement (In Clinic Only). Skin Barriers/Peri-Wound Care: Wound #1 Left,Midline,Posterior Lower Leg: Skin Prep Primary Wound Dressing: Wound #1 Left,Midline,Posterior Lower Leg: Prisma Ag - moisten with saline Secondary Dressing: Wound #1 Left,Midline,Posterior Lower Leg: Boardered Foam Dressing Dressing Change Frequency: Wound #1 Left,Midline,Posterior Lower Leg: Change dressing every other day. Follow-up Appointments: Return Appointment in 2 weeks. Edema Control: Wound #1 Left,Midline,Posterior Lower  Leg: Patient to wear own compression stockings Additional Orders / Instructions: Wound #1 Left,Midline,Posterior Lower Leg: Vitamin A; Vitamin C, Zinc Increase protein intake. I am going to recommend that he continue to wear the compression stockings on his own at this point. Subsequently I'm gonna suggest as well that we switch the dressing to a silver collagen dressing to hopefully stimulate additional granulation more rapidly. Patient is in agreement with this plan. We will see were things stand in one weeks time we see him for reevaluation. Please see above for specific wound care orders. We will see patient for re-evaluation in 1 week(s) here in the clinic. If anything worsens or changes patient will contact our office for additional recommendations. Electronic  Signature(s) Signed: 03/06/2017 4:51:58 PM By: Worthy Keeler PA-C Entered By: Worthy Keeler on 03/06/2017 11:15:42 [name] ([id number]) -------------------------------------------------------------------------------- ROS/PFSH Details Patient Name: [name] Date of Service: 03/06/2017 10:30 AM Medical Record Number: [id number] Patient Account Number: [id number] Date of Birth/Sex: [birth year] (84 y.o. Male) Treating RN: Roger Shelter Primary Care Provider: Gaynelle Arabian Other Clinician: Referring Provider: Gaynelle Arabian Treating Provider/Extender: Melburn Hake, Azka Steger Weeks in Treatment: 10 Information Obtained From Patient Wound History Do you currently have one or more open woundso Yes How many open wounds do you currently haveo 2 Approximately how long have you had your woundso 4 weeks How have you been treating your wound(s) until nowo neosporin Has your wound(s) ever healed and then re-openedo No Have you had any lab work done in the past montho No Have you tested positive for an antibiotic resistant organism (MRSA, VRE)o No Have you tested positive for osteomyelitis (bone infection)o No Have you had any tests for circulation on your legso No Constitutional Symptoms (General Health) Complaints and Symptoms: Negative for: Fever; Chills Cardiovascular Complaints and Symptoms: Positive for: LE edema Medical History: Positive for: Arrhythmia - A- Fib Negative for: Angina; Congestive Heart Failure; Coronary Artery Disease; Deep Vein Thrombosis; Hypertension; Hypotension; Myocardial Infarction; Peripheral Arterial Disease; Peripheral Venous Disease; Phlebitis; Vasculitis Eyes Medical History: Negative for: Cataracts; Glaucoma; Optic Neuritis Ear/Nose/Mouth/Throat Medical History: Negative for: Chronic sinus problems/congestion; Middle ear problems Hematologic/Lymphatic Medical History: Negative for: Anemia; Hemophilia; Human Immunodeficiency Virus; Lymphedema;  Sickle Cell Disease Respiratory Complaints and Symptoms: No Complaints or Symptoms Medical History: Negative for: Aspiration; Asthma; Chronic Obstructive Pulmonary Disease (COPD); Pneumothorax; Sleep Apnea; [name], [name] ([id number]) Tuberculosis Gastrointestinal Medical History: Negative for: Cirrhosis ; Colitis; Crohnos; Hepatitis A; Hepatitis B; Hepatitis C Endocrine Medical History: Negative for: Type I Diabetes; Type II Diabetes Genitourinary Medical History: Negative for: End Stage Renal Disease Immunological Medical History: Negative for: Lupus Erythematosus; Raynaudos Integumentary (Skin) Medical History: Negative for: History of Burn; History of pressure wounds Musculoskeletal Medical History: Negative for: Gout; Rheumatoid Arthritis; Osteoarthritis; Osteomyelitis Neurologic Medical History: Negative for: Dementia; Neuropathy; Quadriplegia; Paraplegia; Seizure Disorder Psychiatric Complaints and Symptoms: No Complaints or Symptoms Medical History: Negative for: Anorexia/bulimia; Confinement Anxiety Immunizations Pneumococcal Vaccine: Received Pneumococcal Vaccination: No Implantable Devices Family and Social History Cancer: No; Diabetes: Yes - Child; Heart Disease: No; Hypertension: Yes - Child; Kidney Disease: No; Lung Disease: No; Seizures: No; Stroke: No; Thyroid Problems: No; Tuberculosis: No; Former smoker - 47 years +; Marital Status - Married; Alcohol Use: Never; Drug Use: No History; Caffeine Use: Rarely; Financial Concerns: No; Food, Clothing or Shelter Needs: No; Support System Lacking: No; Transportation Concerns: No; Advanced Directives: No; Patient does not want information on Advanced Directives; Do not resuscitate: No; Living Will: No; Medical Power of  Attorney: No Physician Affirmation [name], [name] ([id number]) I have reviewed and agree with the above information. Electronic Signature(s) Signed: 03/06/2017 4:14:37 PM By: Roger Shelter Signed: 03/06/2017 4:51:58 PM By: Worthy Keeler PA-C Entered By: Worthy Keeler on 03/06/2017 11:14:39 [name] ([id number]) -------------------------------------------------------------------------------- SuperBill Details Patient Name: [name] Date of Service: 03/06/2017 Medical Record Number: [id number] Patient Account Number: [id number] Date of Birth/Sex: 1930-11-10 (84 y.o. Male) Treating RN: Roger Shelter Primary Care Provider: Gaynelle Arabian Other Clinician: Referring Provider: Gaynelle Arabian Treating Provider/Extender: Melburn Hake, Rael Yo Weeks in Treatment: 10 Diagnosis Coding ICD-10 Codes Code Description I89.0 Lymphedema, not elsewhere classified S81.812A Laceration without foreign body, left lower leg, initial encounter L97.222 Non-pressure chronic ulcer of left calf with fat layer exposed L97.221 Non-pressure chronic ulcer of left calf limited to breakdown of skin Facility Procedures CPT4 Code: [id number] Description: [phone] - WOUND CARE VISIT-LEV 2 EST PT Modifier: Quantity: 1 Physician Procedures CPT4 Code: [id number] Description: [id number] - WC PHYS LEVEL 3 - EST PT ICD-10 Diagnosis Description I89.0 Lymphedema, not elsewhere classified S81.812A Laceration without foreign body, left lower leg, initial enc L97.222 Non-pressure chronic ulcer of left calf with fat layer  expos L97.221 Non-pressure chronic ulcer of left calf limited to breakdown Modifier: ounter ed of skin Quantity: 1 Electronic Signature(s) Signed: 03/06/2017 4:51:58 PM By: Worthy Keeler PA-C Entered By: Worthy Keeler on 03/06/2017 11:16:02

## 2017-03-09 NOTE — Progress Notes (Signed)
[name], [name] ([id number]) Visit Report for 03/06/2017 Arrival Information Details Patient Name: [name], [name]. Date of Service: 03/06/2017 10:30 AM Medical Record Number: [id number] Patient Account Number: [id number] Date of Birth/Sex: 1930-03-25 (84 y.o. Male) Treating RN: Ahmed Prima Primary Care Terrell Ostrand: Gaynelle Arabian Other Clinician: Referring Demetrie Borge: Gaynelle Arabian Treating Lurie Mullane/Extender: Melburn Hake, HOYT Weeks in Treatment: 10 Visit Information History Since Last Visit All ordered tests and consults were completed: No Patient Arrived: Ambulatory Added or deleted any medications: No Arrival Time: 10:30 Any new allergies or adverse reactions: No Accompanied By: spouse Had a fall or experienced change in No Transfer Assistance: None activities of daily living that may affect Patient Identification Verified: Yes risk of falls: Secondary Verification Process Completed: Yes Signs or symptoms of abuse/neglect since last visito No Patient Requires Transmission-Based No Hospitalized since last visit: No Precautions: Has Dressing in Place as Prescribed: Yes Patient Has Alerts: No Pain Present Now: No Electronic Signature(s) Signed: 03/08/2017 4:30:35 PM By: Alric Quan Entered By: Alric Quan on 03/06/2017 10:30:49 [name] ([id number]) -------------------------------------------------------------------------------- Clinic Level of Care Assessment Details Patient Name: [name] Date of Service: 03/06/2017 10:30 AM Medical Record Number: [id number] Patient Account Number: [id number] Date of Birth/Sex: February 29, 1930 (84 y.o. Male) Treating RN: Roger Shelter Primary Care Emelly Wurtz: Gaynelle Arabian Other Clinician: Referring Neven Fina: Gaynelle Arabian Treating Shelly Spenser/Extender: Melburn Hake, HOYT Weeks in Treatment: 10 Clinic Level of Care Assessment Items TOOL 4 Quantity Score []  - Use when only an EandM is performed on FOLLOW-UP visit  0 ASSESSMENTS - Nursing Assessment / Reassessment []  - Reassessment of Co-morbidities (includes updates in patient status) 0 X- 1 5 Reassessment of Adherence to Treatment Plan ASSESSMENTS - Wound and Skin Assessment / Reassessment X - Simple Wound Assessment / Reassessment - one wound 1 5 []  - 0 Complex Wound Assessment / Reassessment - multiple wounds []  - 0 Dermatologic / Skin Assessment (not related to wound area) ASSESSMENTS - Focused Assessment []  - Circumferential Edema Measurements - multi extremities 0 []  - 0 Nutritional Assessment / Counseling / Intervention []  - 0 Lower Extremity Assessment (monofilament, tuning fork, pulses) []  - 0 Peripheral Arterial Disease Assessment (using hand held doppler) ASSESSMENTS - Ostomy and/or Continence Assessment and Care []  - Incontinence Assessment and Management 0 []  - 0 Ostomy Care Assessment and Management (repouching, etc.) PROCESS - Coordination of Care X - Simple Patient / Family Education for ongoing care 1 15 []  - 0 Complex (extensive) Patient / Family Education for ongoing care []  - 0 Staff obtains Programmer, systems, Records, Test Results / Process Orders []  - 0 Staff telephones HHA, Nursing Homes / Clarify orders / etc []  - 0 Routine Transfer to another Facility (non-emergent condition) []  - 0 Routine Hospital Admission (non-emergent condition) []  - 0 New Admissions / Biomedical engineer / Ordering NPWT, Apligraf, etc. []  - 0 Emergency Hospital Admission (emergent condition) X- 1 10 Simple Discharge Coordination [name], [name] ([id number]) []  - 0 Complex (extensive) Discharge Coordination PROCESS - Special Needs []  - Pediatric / Minor Patient Management 0 []  - 0 Isolation Patient Management []  - 0 Hearing / Language / Visual special needs []  - 0 Assessment of Community assistance (transportation, D/C planning, etc.) []  - 0 Additional assistance / Altered mentation []  - 0 Support Surface(s) Assessment (bed,  cushion, seat, etc.) INTERVENTIONS - Wound Cleansing / Measurement X - Simple Wound Cleansing - one wound 1 5 []  - 0 Complex Wound Cleansing - multiple wounds X- 1 5 Wound Imaging (photographs - any  number of wounds) []  - 0 Wound Tracing (instead of photographs) X- 1 5 Simple Wound Measurement - one wound []  - 0 Complex Wound Measurement - multiple wounds INTERVENTIONS - Wound Dressings X - Small Wound Dressing one or multiple wounds 1 10 []  - 0 Medium Wound Dressing one or multiple wounds []  - 0 Large Wound Dressing one or multiple wounds []  - 0 Application of Medications - topical []  - 0 Application of Medications - injection INTERVENTIONS - Miscellaneous []  - External ear exam 0 []  - 0 Specimen Collection (cultures, biopsies, blood, body fluids, etc.) []  - 0 Specimen(s) / Culture(s) sent or taken to Lab for analysis []  - 0 Patient Transfer (multiple staff / Civil Service fast streamer / Similar devices) []  - 0 Simple Staple / Suture removal (25 or less) []  - 0 Complex Staple / Suture removal (26 or more) []  - 0 Hypo / Hyperglycemic Management (close monitor of Blood Glucose) []  - 0 Ankle / Brachial Index (ABI) - do not check if billed separately X- 1 5 Vital Signs [name], Danny J. ([id number]) Has the patient been seen at the hospital within the last three years: Yes Total Score: 65 Level Of Care: New/Established - Level 2 Electronic Signature(s) Signed: 03/06/2017 4:14:37 PM By: Roger Shelter Entered By: Roger Shelter on 03/06/2017 10:45:28 [name] ([id number]) -------------------------------------------------------------------------------- Encounter Discharge Information Details Patient Name: [name] Date of Service: 03/06/2017 10:30 AM Medical Record Number: [id number] Patient Account Number: [id number] Date of Birth/Sex: [birth year] (84 y.o. Male) Treating RN: Roger Shelter Primary Care Braxen Dobek: Gaynelle Arabian Other Clinician: Referring Kamon Fahr:  Gaynelle Arabian Treating Aulden Calise/Extender: Melburn Hake, HOYT Weeks in Treatment: 10 Encounter Discharge Information Items Discharge Pain Level: 0 Discharge Condition: Stable Ambulatory Status: Cane Discharge Destination: Home Transportation: Private Auto Accompanied By: spouse Schedule Follow-up Appointment: Yes Medication Reconciliation completed and No provided to Patient/Care Davene Jobin: Provided on Clinical Summary of Care: 03/06/2017 Form Type Recipient Paper Patient AN Electronic Signature(s) Signed: 03/06/2017 12:35:26 PM By: Montey Hora Entered By: Montey Hora on 03/06/2017 12:35:26 [name] ([id number]) -------------------------------------------------------------------------------- Lower Extremity Assessment Details Patient Name: [name] Date of Service: 03/06/2017 10:30 AM Medical Record Number: [id number] Patient Account Number: [id number] Date of Birth/Sex: 08-27-1930 (84 y.o. Male) Treating RN: Ahmed Prima Primary Care Goldy Calandra: Gaynelle Arabian Other Clinician: Referring Jadelynn Boylan: Gaynelle Arabian Treating Brynnleigh Mcelwee/Extender: Melburn Hake, HOYT Weeks in Treatment: 10 Vascular Assessment Pulses: Dorsalis Pedis Palpable: [Left:Yes] Posterior Tibial Extremity colors, hair growth, and conditions: Extremity Color: [Left:Hyperpigmented] Temperature of Extremity: [Left:Warm] Capillary Refill: [Left:< 3 seconds] Toe Nail Assessment Left: Right: Thick: Yes Discolored: Yes Deformed: Yes Improper Length and Hygiene: Yes Electronic Signature(s) Signed: 03/08/2017 4:30:35 PM By: Alric Quan Entered By: Alric Quan on 03/06/2017 10:34:14 [name] ([id number]) -------------------------------------------------------------------------------- Multi Wound Chart Details Patient Name: [name] Date of Service: 03/06/2017 10:30 AM Medical Record Number: [id number] Patient Account Number: [id number] Date of Birth/Sex: May 27, 1930 (84 y.o.  Male) Treating RN: Roger Shelter Primary Care Raechal Raben: Gaynelle Arabian Other Clinician: Referring Hattie Aguinaldo: Gaynelle Arabian Treating Ky Rumple/Extender: Melburn Hake, HOYT Weeks in Treatment: 10 Vital Signs Height(in): 72 Pulse(bpm): 88 Weight(lbs): 217 Blood Pressure(mmHg): 150/58 Body Mass Index(BMI): 29 Temperature(F): 97.7 Respiratory Rate 16 (breaths/min): Photos: [1:No Photos] [N/A:N/A] Wound Location: [1:Left Lower Leg - Midline, Posterior] [N/A:N/A] Wounding Event: [1:Gradually Appeared] [N/A:N/A] Primary Etiology: [1:Venous Leg Ulcer] [N/A:N/A] Comorbid History: [1:Arrhythmia] [N/A:N/A] Date Acquired: [1:12/01/2016] [N/A:N/A] Weeks of Treatment: [1:10] [N/A:N/A] Wound Status: [1:Open] [N/A:N/A] Measurements L x W x D [1:1.4x2.5x0.1] [N/A:N/A] (cm) Area (cm) : [  1:2.749] [N/A:N/A] Volume (cm) : [1:0.275] [N/A:N/A] % Reduction in Area: [1:-133.40%] [N/A:N/A] % Reduction in Volume: [1:-133.10%] [N/A:N/A] Classification: [1:Full Thickness Without Exposed Support Structures] [N/A:N/A] Exudate Amount: [1:Large] [N/A:N/A] Exudate Type: [1:Serosanguineous] [N/A:N/A] Exudate Color: [1:red, brown] [N/A:N/A] Wound Margin: [1:Distinct, outline attached] [N/A:N/A] Granulation Amount: [1:Medium (34-66%)] [N/A:N/A] Granulation Quality: [1:Red] [N/A:N/A] Necrotic Amount: [1:Medium (34-66%)] [N/A:N/A] Exposed Structures: [1:Fat Layer (Subcutaneous Tissue) Exposed: Yes] [N/A:N/A] Epithelialization: [1:None] [N/A:N/A] Periwound Skin Texture: [1:Induration: Yes Scarring: Yes Excoriation: No Callus: No Crepitus: No Rash: No] [N/A:N/A] Periwound Skin Moisture: [1:Maceration: Yes Dry/Scaly: No] [N/A:N/A] Periwound Skin Color: [1:Ecchymosis: Yes Atrophie Blanche: No] [N/A:N/A] Cyanosis: No Erythema: No Hemosiderin Staining: No Mottled: No Pallor: No Rubor: No Temperature: No Abnormality N/A N/A Tenderness on Palpation: Yes N/A N/A Wound Preparation: Ulcer Cleansing: N/A  N/A Rinsed/Irrigated with Saline Topical Anesthetic Applied: Other: lidocaine 4% Treatment Notes Electronic Signature(s) Signed: 03/06/2017 4:14:37 PM By: Roger Shelter Entered By: Roger Shelter on 03/06/2017 10:41:31 [name] ([id number]) -------------------------------------------------------------------------------- Multi-Disciplinary Care Plan Details Patient Name: [name] Date of Service: 03/06/2017 10:30 AM Medical Record Number: [id number] Patient Account Number: [id number] Date of Birth/Sex: 1930/06/09 (84 y.o. Male) Treating RN: Roger Shelter Primary Care Perrin Eddleman: Gaynelle Arabian Other Clinician: Referring Diron Haddon: Gaynelle Arabian Treating Shirlee Whitmire/Extender: Melburn Hake, HOYT Weeks in Treatment: 10 Active Inactive ` Orientation to the Wound Care Program Nursing Diagnoses: Knowledge deficit related to the wound healing center program Goals: Patient/caregiver will verbalize understanding of the [facility] Date Initiated: 12/22/2016 Target Resolution Date: 01/12/2017 Goal Status: Active Interventions: Provide education on orientation to the wound center Notes: ` Wound/Skin Impairment Nursing Diagnoses: Impaired tissue integrity Knowledge deficit related to ulceration/compromised skin integrity Goals: Patient/caregiver will verbalize understanding of skin care regimen Date Initiated: 12/22/2016 Target Resolution Date: 01/12/2017 Goal Status: Active Ulcer/skin breakdown will have a volume reduction of 30% by week 4 Date Initiated: 12/22/2016 Target Resolution Date: 01/12/2017 Goal Status: Active Interventions: Assess patient/caregiver ability to obtain necessary supplies Assess ulceration(s) every visit Treatment Activities: Skin care regimen initiated : 12/22/2016 Notes: Electronic Signature(s) Signed: 03/06/2017 4:14:37 PM By: Darin Engels ([id number]) Entered By: Roger Shelter on 03/06/2017  10:41:23 [name] ([id number]) -------------------------------------------------------------------------------- Pain Assessment Details Patient Name: [name] Date of Service: 03/06/2017 10:30 AM Medical Record Number: [id number] Patient Account Number: [id number] Date of Birth/Sex: [birth year] (84 y.o. Male) Treating RN: Ahmed Prima Primary Care Jemimah Cressy: Gaynelle Arabian Other Clinician: Referring Jendayi Berling: Gaynelle Arabian Treating Terrez Ander/Extender: Melburn Hake, HOYT Weeks in Treatment: 10 Active Problems Location of Pain Severity and Description of Pain Patient Has Paino No Site Locations Pain Management and Medication Current Pain Management: Electronic Signature(s) Signed: 03/08/2017 4:30:35 PM By: Alric Quan Entered By: Alric Quan on 03/06/2017 10:30:55 [name] ([id number]) -------------------------------------------------------------------------------- Patient/Caregiver Education Details Patient Name: [name] Date of Service: 03/06/2017 10:30 AM Medical Record Number: [id number] Patient Account Number: [id number] Date of Birth/Gender: 01-31-1930 (84 y.o. Male) Treating RN: Montey Hora Primary Care Physician: Gaynelle Arabian Other Clinician: Referring Physician: Gaynelle Arabian Treating Physician/Extender: Sharalyn Ink in Treatment: 10 Education Assessment Education Provided To: Patient Education Topics Provided Wound/Skin Impairment: Handouts: Other: wound care as ordered Methods: Demonstration, Explain/Verbal Responses: State content correctly Electronic Signature(s) Signed: 03/06/2017 4:35:46 PM By: Montey Hora Entered By: Montey Hora on 03/06/2017 12:35:43 [name] ([id number]) -------------------------------------------------------------------------------- Wound Assessment Details Patient Name: [name] Date of Service: 03/06/2017 10:30 AM Medical Record Number: [id number] Patient Account  Number: [id number] Date of Birth/Sex: 04-03-1930 (84 y.o.  Male) Treating RN: Carolyne Fiscal, Debi Primary Care Azarian Starace: Gaynelle Arabian Other Clinician: Referring Toniesha Zellner: Gaynelle Arabian Treating Azka Steger/Extender: Melburn Hake, HOYT Weeks in Treatment: 10 Wound Status Wound Number: 1 Primary Etiology: Venous Leg Ulcer Wound Location: Left Lower Leg - Midline, Posterior Wound Status: Open Wounding Event: Gradually Appeared Comorbid History: Arrhythmia Date Acquired: 12/01/2016 Weeks Of Treatment: 10 Clustered Wound: No Photos Photo Uploaded By: Roger Shelter on 03/06/2017 16:06:52 Wound Measurements Length: (cm) 1.4 Width: (cm) 2.5 Depth: (cm) 0.1 Area: (cm) 2.749 Volume: (cm) 0.275 % Reduction in Area: -133.4% % Reduction in Volume: -133.1% Epithelialization: None Tunneling: No Undermining: No Wound Description Full Thickness Without Exposed Support Classification: Structures Wound Margin: Distinct, outline attached Exudate Large Amount: Exudate Type: Serosanguineous Exudate Color: red, brown Foul Odor After Cleansing: No Slough/Fibrino Yes Wound Bed Granulation Amount: Medium (34-66%) Exposed Structure Granulation Quality: Red Fat Layer (Subcutaneous Tissue) Exposed: Yes Necrotic Amount: Medium (34-66%) Necrotic Quality: Adherent Slough Periwound Skin Texture Texture Color No Abnormalities Noted: No No Abnormalities Noted: No [name], [name]. ([id number]) Callus: No Atrophie Blanche: No Crepitus: No Cyanosis: No Excoriation: No Ecchymosis: Yes Induration: Yes Erythema: No Rash: No Hemosiderin Staining: No Scarring: Yes Mottled: No Pallor: No Moisture Rubor: No No Abnormalities Noted: No Dry / Scaly: No Temperature / Pain Maceration: Yes Temperature: No Abnormality Tenderness on Palpation: Yes Wound Preparation Ulcer Cleansing: Rinsed/Irrigated with Saline Topical Anesthetic Applied: Other: lidocaine 4%, Treatment Notes Wound #1 (Left,  Midline, Posterior Lower Leg) 1. Cleansed with: Clean wound with Normal Saline 2. Anesthetic Topical Lidocaine 4% cream to wound bed prior to debridement 3. Peri-wound Care: Skin Prep 4. Dressing Applied: Prisma Ag 5. Secondary Dressing Applied Bordered Foam Dressing Dry Gauze Electronic Signature(s) Signed: 03/08/2017 4:30:35 PM By: Alric Quan Entered By: Alric Quan on 03/06/2017 10:36:57 [name] ([id number]) -------------------------------------------------------------------------------- Vitals Details Patient Name: [name] Date of Service: 03/06/2017 10:30 AM Medical Record Number: [id number] Patient Account Number: [id number] Date of Birth/Sex: [birth year] (84 y.o. Male) Treating RN: Ahmed Prima Primary Care Nathaneil Feagans: Gaynelle Arabian Other Clinician: Referring Saim Almanza: Gaynelle Arabian Treating Sharmane Dame/Extender: Melburn Hake, HOYT Weeks in Treatment: 10 Vital Signs Time Taken: 10:30 Temperature (F): 97.7 Height (in): 72 Pulse (bpm): 88 Weight (lbs): 217 Respiratory Rate (breaths/min): 16 Body Mass Index (BMI): 29.4 Blood Pressure (mmHg): 150/58 Reference Range: 80 - 120 mg / dl Electronic Signature(s) Signed: 03/08/2017 4:30:35 PM By: Alric Quan Entered By: Alric Quan on 03/06/2017 10:33:02

## 2017-03-13 ENCOUNTER — Encounter: Payer: Medicare Other | Attending: Physician Assistant | Admitting: Physician Assistant

## 2017-03-13 DIAGNOSIS — Z96641 Presence of right artificial hip joint: Secondary | ICD-10-CM | POA: Insufficient documentation

## 2017-03-13 DIAGNOSIS — I890 Lymphedema, not elsewhere classified: Secondary | ICD-10-CM | POA: Diagnosis not present

## 2017-03-13 DIAGNOSIS — E1151 Type 2 diabetes mellitus with diabetic peripheral angiopathy without gangrene: Secondary | ICD-10-CM | POA: Diagnosis not present

## 2017-03-13 DIAGNOSIS — Z87891 Personal history of nicotine dependence: Secondary | ICD-10-CM | POA: Diagnosis not present

## 2017-03-13 DIAGNOSIS — E11622 Type 2 diabetes mellitus with other skin ulcer: Secondary | ICD-10-CM | POA: Diagnosis not present

## 2017-03-13 DIAGNOSIS — Z9049 Acquired absence of other specified parts of digestive tract: Secondary | ICD-10-CM | POA: Insufficient documentation

## 2017-03-13 DIAGNOSIS — I480 Paroxysmal atrial fibrillation: Secondary | ICD-10-CM | POA: Diagnosis not present

## 2017-03-13 DIAGNOSIS — K5790 Diverticulosis of intestine, part unspecified, without perforation or abscess without bleeding: Secondary | ICD-10-CM | POA: Diagnosis not present

## 2017-03-13 DIAGNOSIS — I10 Essential (primary) hypertension: Secondary | ICD-10-CM | POA: Diagnosis not present

## 2017-03-13 DIAGNOSIS — L97222 Non-pressure chronic ulcer of left calf with fat layer exposed: Secondary | ICD-10-CM | POA: Diagnosis present

## 2017-03-16 NOTE — Progress Notes (Signed)
ARSHAN, JABS ([id number]) Visit Report for 03/13/2017 Arrival Information Details Patient Name: [name], [name]. Date of Service: 03/13/2017 10:30 AM Medical Record Number: [id number] Patient Account Number: [id number] Date of Birth/Sex: 1930-02-19 (84 y.o. Male) Treating RN: Ahmed Prima Primary Care Jerald Villalona: Gaynelle Arabian Other Clinician: Referring Lunden Mcleish: Gaynelle Arabian Treating Martin Smeal/Extender: Melburn Hake, HOYT Weeks in Treatment: 11 Visit Information History Since Last Visit All ordered tests and consults were completed: No Patient Arrived: Ambulatory Added or deleted any medications: No Arrival Time: 10:40 Any new allergies or adverse reactions: No Accompanied By: wife Had a fall or experienced change in No Transfer Assistance: None activities of daily living that may affect Patient Identification Verified: Yes risk of falls: Secondary Verification Process Completed: Yes Signs or symptoms of abuse/neglect since last visito No Patient Requires Transmission-Based No Hospitalized since last visit: No Precautions: Has Dressing in Place as Prescribed: Yes Patient Has Alerts: No Has Compression in Place as Prescribed: Yes Pain Present Now: Yes Electronic Signature(s) Signed: 03/15/2017 4:15:20 PM By: Alric Quan Entered By: Alric Quan on 03/13/2017 10:41:17 [name] ([id number]) -------------------------------------------------------------------------------- Clinic Level of Care Assessment Details Patient Name: [name] Date of Service: 03/13/2017 10:30 AM Medical Record Number: [id number] Patient Account Number: [id number] Date of Birth/Sex: October 04, 1930 (84 y.o. Male) Treating RN: Roger Shelter Primary Care Lamarius Dirr: Gaynelle Arabian Other Clinician: Referring Zariel Capano: Gaynelle Arabian Treating Arnoldo Hildreth/Extender: Melburn Hake, HOYT Weeks in Treatment: 11 Clinic Level of Care Assessment Items TOOL 1 Quantity Score []  - Use when EandM and  Procedure is performed on INITIAL visit 0 ASSESSMENTS - Nursing Assessment / Reassessment []  - General Physical Exam (combine w/ comprehensive assessment (listed just below) when 0 performed on new pt. evals) []  - 0 Comprehensive Assessment (HX, ROS, Risk Assessments, Wounds Hx, etc.) ASSESSMENTS - Wound and Skin Assessment / Reassessment []  - Dermatologic / Skin Assessment (not related to wound area) 0 ASSESSMENTS - Ostomy and/or Continence Assessment and Care []  - Incontinence Assessment and Management 0 []  - 0 Ostomy Care Assessment and Management (repouching, etc.) PROCESS - Coordination of Care []  - Simple Patient / Family Education for ongoing care 0 []  - 0 Complex (extensive) Patient / Family Education for ongoing care []  - 0 Staff obtains Programmer, systems, Records, Test Results / Process Orders []  - 0 Staff telephones HHA, Nursing Homes / Clarify orders / etc []  - 0 Routine Transfer to another Facility (non-emergent condition) []  - 0 Routine Hospital Admission (non-emergent condition) []  - 0 New Admissions / Biomedical engineer / Ordering NPWT, Apligraf, etc. []  - 0 Emergency Hospital Admission (emergent condition) PROCESS - Special Needs []  - Pediatric / Minor Patient Management 0 []  - 0 Isolation Patient Management []  - 0 Hearing / Language / Visual special needs []  - 0 Assessment of Community assistance (transportation, D/C planning, etc.) []  - 0 Additional assistance / Altered mentation []  - 0 Support Surface(s) Assessment (bed, cushion, seat, etc.) [name], [name]. ([id number]) INTERVENTIONS - Miscellaneous []  - External ear exam 0 []  - 0 Patient Transfer (multiple staff / Civil Service fast streamer / Similar devices) []  - 0 Simple Staple / Suture removal (25 or less) []  - 0 Complex Staple / Suture removal (26 or more) []  - 0 Hypo/Hyperglycemic Management (do not check if billed separately) []  - 0 Ankle / Brachial Index (ABI) - do not check if billed separately Has the  patient been seen at the hospital within the last three years: Yes Total Score: 0 Level Of Care: ____ Electronic Signature(s) Signed: 03/15/2017  4:15:18 PM By: Roger Shelter Entered By: Roger Shelter on 03/13/2017 11:06:43 [name] ([id number]) -------------------------------------------------------------------------------- Encounter Discharge Information Details Patient Name: [name] Date of Service: 03/13/2017 10:30 AM Medical Record Number: [id number] Patient Account Number: [id number] Date of Birth/Sex: 08-05-1930 (84 y.o. Male) Treating RN: Roger Shelter Primary Care Sherron Mapp: Gaynelle Arabian Other Clinician: Referring Vidyuth Belsito: Gaynelle Arabian Treating Averee Harb/Extender: Melburn Hake, HOYT Weeks in Treatment: 11 Encounter Discharge Information Items Discharge Pain Level: 0 Discharge Condition: Stable Ambulatory Status: Ambulatory Discharge Destination: Home Transportation: Private Auto Accompanied By: wife Schedule Follow-up Appointment: Yes Medication Reconciliation completed and Yes provided to Patient/Care Arjun Hard: Provided on Clinical Summary of Care: 03/13/2017 Form Type Recipient Paper Patient AN Electronic Signature(s) Signed: 03/14/2017 9:25:53 AM By: Gretta Cool, BSN, RN, CWS, Kim RN, BSN Entered By: Gretta Cool, BSN, RN, CWS, Kim on 03/13/2017 11:13:23 [name] ([id number]) -------------------------------------------------------------------------------- Lower Extremity Assessment Details Patient Name: [name], [name]. Date of Service: 03/13/2017 10:30 AM Medical Record Number: [id number] Patient Account Number: [id number] Date of Birth/Sex: 01-09-1931 (84 y.o. Male) Treating RN: Ahmed Prima Primary Care Tatelyn Vanhecke: Gaynelle Arabian Other Clinician: Referring Joffre Lucks: Gaynelle Arabian Treating Yuvia Plant/Extender: Melburn Hake, HOYT Weeks in Treatment: 11 Vascular Assessment Pulses: Dorsalis Pedis Palpable: [Left:Yes] Posterior Tibial Extremity colors,  hair growth, and conditions: Extremity Color: [Left:Hyperpigmented] Temperature of Extremity: [Left:Warm] Capillary Refill: [Left:< 3 seconds] Toe Nail Assessment Left: Right: Thick: Yes Discolored: Yes Deformed: Yes Improper Length and Hygiene: Yes Electronic Signature(s) Signed: 03/15/2017 4:15:20 PM By: Alric Quan Entered By: Alric Quan on 03/13/2017 10:50:38 [name] ([id number]) -------------------------------------------------------------------------------- Multi Wound Chart Details Patient Name: [name] Date of Service: 03/13/2017 10:30 AM Medical Record Number: [id number] Patient Account Number: [id number] Date of Birth/Sex: Apr 30, 1930 (84 y.o. Male) Treating RN: Roger Shelter Primary Care Adilyn Humes: Gaynelle Arabian Other Clinician: Referring Guadalupe Kerekes: Gaynelle Arabian Treating Miciah Shealy/Extender: Melburn Hake, HOYT Weeks in Treatment: 11 Vital Signs Height(in): 72 Pulse(bpm): 89 Weight(lbs): 217 Blood Pressure(mmHg): 132/79 Body Mass Index(BMI): 29 Temperature(F): 97.7 Respiratory Rate 16 (breaths/min): Photos: [1:No Photos] [N/A:N/A] Wound Location: [1:Left Lower Leg - Midline, Posterior] [N/A:N/A] Wounding Event: [1:Gradually Appeared] [N/A:N/A] Primary Etiology: [1:Venous Leg Ulcer] [N/A:N/A] Comorbid History: [1:Arrhythmia] [N/A:N/A] Date Acquired: [1:12/01/2016] [N/A:N/A] Weeks of Treatment: [1:11] [N/A:N/A] Wound Status: [1:Open] [N/A:N/A] Measurements L x W x D [1:1.7x2.6x0.2] [N/A:N/A] (cm) Area (cm) : [1:3.471] [N/A:N/A] Volume (cm) : [1:0.694] [N/A:N/A] % Reduction in Area: [1:-194.70%] [N/A:N/A] % Reduction in Volume: [1:-488.10%] [N/A:N/A] Classification: [1:Full Thickness Without Exposed Support Structures] [N/A:N/A] Exudate Amount: [1:Large] [N/A:N/A] Exudate Type: [1:Purulent] [N/A:N/A] Exudate Color: [1:yellow, brown, green] [N/A:N/A] Wound Margin: [1:Distinct, outline attached] [N/A:N/A] Granulation Amount:  [1:Medium (34-66%)] [N/A:N/A] Granulation Quality: [1:Red] [N/A:N/A] Necrotic Amount: [1:Medium (34-66%)] [N/A:N/A] Exposed Structures: [1:Fat Layer (Subcutaneous Tissue) Exposed: Yes] [N/A:N/A] Epithelialization: [1:None] [N/A:N/A] Periwound Skin Texture: [1:Induration: Yes Scarring: Yes Excoriation: No Callus: No Crepitus: No Rash: No] [N/A:N/A] Periwound Skin Moisture: [1:Maceration: Yes Dry/Scaly: No] [N/A:N/A] Periwound Skin Color: [1:Ecchymosis: Yes Atrophie Blanche: No] [N/A:N/A] Cyanosis: No Erythema: No Hemosiderin Staining: No Mottled: No Pallor: No Rubor: No Temperature: No Abnormality N/A N/A Tenderness on Palpation: Yes N/A N/A Wound Preparation: Ulcer Cleansing: N/A N/A Rinsed/Irrigated with Saline Topical Anesthetic Applied: Other: lidocaine 4% Treatment Notes Electronic Signature(s) Signed: 03/15/2017 4:15:18 PM By: Roger Shelter Entered By: Roger Shelter on 03/13/2017 11:01:24 [name] ([id number]) -------------------------------------------------------------------------------- Multi-Disciplinary Care Plan Details Patient Name: [name] Date of Service: 03/13/2017 10:30 AM Medical Record Number: [id number] Patient Account Number: [id number] Date of Birth/Sex: 1930-01-21 (84 y.o. Male) Treating RN: Roger Shelter Primary  Care Tylan Briguglio: Gaynelle Arabian Other Clinician: Referring Jacyln Carmer: Gaynelle Arabian Treating Tiphany Fayson/Extender: Melburn Hake, HOYT Weeks in Treatment: 11 Active Inactive ` Orientation to the Wound Care Program Nursing Diagnoses: Knowledge deficit related to the wound healing center program Goals: Patient/caregiver will verbalize understanding of the [facility] Program Date Initiated: 12/22/2016 Target Resolution Date: 01/12/2017 Goal Status: Active Interventions: Provide education on orientation to the wound center Notes: ` Wound/Skin Impairment Nursing Diagnoses: Impaired tissue integrity Knowledge deficit  related to ulceration/compromised skin integrity Goals: Patient/caregiver will verbalize understanding of skin care regimen Date Initiated: 12/22/2016 Target Resolution Date: 01/12/2017 Goal Status: Active Ulcer/skin breakdown will have a volume reduction of 30% by week 4 Date Initiated: 12/22/2016 Target Resolution Date: 01/12/2017 Goal Status: Active Interventions: Assess patient/caregiver ability to obtain necessary supplies Assess ulceration(s) every visit Treatment Activities: Skin care regimen initiated : 12/22/2016 Notes: Electronic Signature(s) Signed: 03/15/2017 4:15:18 PM By: Darin Engels ([id number]) Entered By: Roger Shelter on 03/13/2017 11:01:15 [name] ([id number]) -------------------------------------------------------------------------------- Pain Assessment Details Patient Name: [name] Date of Service: 03/13/2017 10:30 AM Medical Record Number: [id number] Patient Account Number: [id number] Date of Birth/Sex: 12-Feb-1930 (84 y.o. Male) Treating RN: Ahmed Prima Primary Care Accalia Rigdon: Gaynelle Arabian Other Clinician: Referring Bani Gianfrancesco: Gaynelle Arabian Treating Shawanna Zanders/Extender: Melburn Hake, HOYT Weeks in Treatment: 11 Active Problems Location of Pain Severity and Description of Pain Patient Has Paino Yes Site Locations Rate the pain. Current Pain Level: 2 Character of Pain Describe the Pain: Aching Pain Management and Medication Current Pain Management: Notes Pt states he is having calf pain. Topical or injectable lidocaine is offered to patient for acute pain when surgical debridement is performed. If needed, Patient is instructed to use over the counter pain medication for the following 24-48 hours after debridement. Wound care MDs do not prescribed pain medications. Patient has chronic pain or uncontrolled pain. Patient has been instructed to make an appointment with their Primary Care Physician for pain  management. Electronic Signature(s) Signed: 03/15/2017 4:15:20 PM By: Alric Quan Entered By: Alric Quan on 03/13/2017 10:42:12 [name] ([id number]) -------------------------------------------------------------------------------- Patient/Caregiver Education Details Patient Name: [name] Date of Service: 03/13/2017 10:30 AM Medical Record Number: [id number] Patient Account Number: [id number] Date of Birth/Gender: 1930/08/05 (84 y.o. Male) Treating RN: Cornell Barman Primary Care Physician: Gaynelle Arabian Other Clinician: Referring Physician: Gaynelle Arabian Treating Physician/Extender: Sharalyn Ink in Treatment: 11 Education Assessment Education Provided To: Patient Education Topics Provided Wound/Skin Impairment: Handouts: Caring for Your Ulcer Methods: Demonstration, Explain/Verbal Responses: State content correctly Electronic Signature(s) Signed: 03/14/2017 9:25:53 AM By: Gretta Cool, BSN, RN, CWS, Kim RN, BSN Entered By: Gretta Cool, BSN, RN, CWS, Kim on 03/13/2017 11:13:36 [name] ([id number]) -------------------------------------------------------------------------------- Wound Assessment Details Patient Name: [name], [name]. Date of Service: 03/13/2017 10:30 AM Medical Record Number: [id number] Patient Account Number: [id number] Date of Birth/Sex: [birth year] (84 y.o. Male) Treating RN: Ahmed Prima Primary Care Panhia Karl: Gaynelle Arabian Other Clinician: Referring Berlyn Saylor: Gaynelle Arabian Treating Dorothea Yow/Extender: Melburn Hake, HOYT Weeks in Treatment: 11 Wound Status Wound Number: 1 Primary Etiology: Venous Leg Ulcer Wound Location: Left Lower Leg - Midline, Posterior Wound Status: Open Wounding Event: Gradually Appeared Comorbid History: Arrhythmia Date Acquired: 12/01/2016 Weeks Of Treatment: 11 Clustered Wound: No Wound Measurements Length: (cm) 1.7 Width: (cm) 2.6 Depth: (cm) 0.2 Area: (cm) 3.471 Volume: (cm) 0.694 % Reduction in  Area: -194.7% % Reduction in Volume: -488.1% Epithelialization: None Tunneling: No Undermining: No Wound Description Full Thickness Without Exposed Support Classification: Structures Wound Margin:  Distinct, outline attached Exudate Large Amount: Exudate Type: Purulent Exudate Color: yellow, brown, green Foul Odor After Cleansing: No Slough/Fibrino Yes Wound Bed Granulation Amount: Medium (34-66%) Exposed Structure Granulation Quality: Red Fat Layer (Subcutaneous Tissue) Exposed: Yes Necrotic Amount: Medium (34-66%) Necrotic Quality: Adherent Slough Periwound Skin Texture Texture Color No Abnormalities Noted: No No Abnormalities Noted: No Callus: No Atrophie Blanche: No Crepitus: No Cyanosis: No Excoriation: No Ecchymosis: Yes Induration: Yes Erythema: No Rash: No Hemosiderin Staining: No Scarring: Yes Mottled: No Pallor: No Moisture Rubor: No No Abnormalities Noted: No Dry / Scaly: No Temperature / Pain Maceration: Yes Temperature: No Abnormality Tenderness on Palpation: Yes [name], [name] ([id number]) Wound Preparation Ulcer Cleansing: Rinsed/Irrigated with Saline Topical Anesthetic Applied: Other: lidocaine 4%, Treatment Notes Wound #1 (Left, Midline, Posterior Lower Leg) 1. Cleansed with: Clean wound with Normal Saline 2. Anesthetic Topical Lidocaine 4% cream to wound bed prior to debridement 4. Dressing Applied: Prisma Ag Other dressing (specify in notes) 5. Secondary Dressing Applied Bordered Foam Dressing Notes drawtex under BFD Electronic Signature(s) Signed: 03/15/2017 4:15:20 PM By: Alric Quan Entered By: Alric Quan on 03/13/2017 10:49:46 [name] ([id number]) -------------------------------------------------------------------------------- Vitals Details Patient Name: [name] Date of Service: 03/13/2017 10:30 AM Medical Record Number: [id number] Patient Account Number: [id number] Date of Birth/Sex: 1930/02/07 (84  y.o. Male) Treating RN: Ahmed Prima Primary Care Kahmari Koller: Gaynelle Arabian Other Clinician: Referring Mayan Kloepfer: Gaynelle Arabian Treating Evelen Vazguez/Extender: Melburn Hake, HOYT Weeks in Treatment: 11 Vital Signs Time Taken: 10:44 Temperature (F): 97.7 Height (in): 72 Pulse (bpm): 89 Weight (lbs): 217 Respiratory Rate (breaths/min): 16 Body Mass Index (BMI): 29.4 Blood Pressure (mmHg): 132/79 Reference Range: 80 - 120 mg / dl Electronic Signature(s) Signed: 03/15/2017 4:15:20 PM By: Alric Quan Entered By: Alric Quan on 03/13/2017 10:45:46

## 2017-03-16 NOTE — Progress Notes (Signed)
DONIVAN, Mcdonald ([id number]) Visit Report for 03/13/2017 Chief Complaint Document Details Patient Name: [name], [name]. Date of Service: 03/13/2017 10:30 AM Medical Record Number: [id number] Patient Account Number: [id number] Date of Birth/Sex: 02-13-1930 (84 y.o. Male) Treating RN: Roger Shelter Primary Care Provider: Gaynelle Arabian Other Clinician: Referring Provider: Gaynelle Arabian Treating Provider/Extender: Melburn Hake, Stanton Kissoon Weeks in Treatment: 11 Information Obtained from: Patient Chief Complaint He is seen for follow up evaluation of lle ulcers Electronic Signature(s) Signed: 03/13/2017 3:03:07 PM By: Worthy Keeler PA-C Entered By: Worthy Keeler on 03/13/2017 10:43:12 [name] ([id number]) -------------------------------------------------------------------------------- Debridement Details Patient Name: [name] Date of Service: 03/13/2017 10:30 AM Medical Record Number: [id number] Patient Account Number: [id number] Date of Birth/Sex: 1930/05/17 (84 y.o. Male) Treating RN: Roger Shelter Primary Care Provider: Gaynelle Arabian Other Clinician: Referring Provider: Gaynelle Arabian Treating Provider/Extender: Melburn Hake, Wonder Donaway Weeks in Treatment: 11 Debridement Performed for Wound #1 Left,Midline,Posterior Lower Leg Assessment: Performed By: Physician STONE III, Joie Hipps E., PA-C Debridement: Debridement Severity of Tissue Pre Fat layer exposed Debridement: Pre-procedure Verification/Time Yes - 11:02 Out Taken: Start Time: 11:02 Pain Control: Other : lidocaine 4% Level: Skin/Subcutaneous Tissue Total Area Debrided (L x W): 1.7 (cm) x 2.8 (cm) = 4.76 (cm) Tissue and other material Viable, Non-Viable, Fibrin/Slough, Skin, Subcutaneous debrided: Instrument: Curette Bleeding: Minimum Hemostasis Achieved: Pressure End Time: 11:03 Procedural Pain: 0 Post Procedural Pain: 0 Response to Treatment: Procedure was tolerated well Post Debridement Measurements of  Total Wound Length: (cm) 1.7 Width: (cm) 2.6 Depth: (cm) 0.2 Volume: (cm) 0.694 Character of Wound/Ulcer Post Debridement: Stable Severity of Tissue Post Debridement: Fat layer exposed Post Procedure Diagnosis Same as Pre-procedure Electronic Signature(s) Signed: 03/13/2017 3:03:07 PM By: Worthy Keeler PA-C Signed: 03/15/2017 4:15:18 PM By: Roger Shelter Entered By: Roger Shelter on 03/13/2017 11:04:25 [name] ([id number]) -------------------------------------------------------------------------------- HPI Details Patient Name: [name] Date of Service: 03/13/2017 10:30 AM Medical Record Number: [id number] Patient Account Number: [id number] Date of Birth/Sex: Apr 27, 1930 (84 y.o. Male) Treating RN: Roger Shelter Primary Care Provider: Gaynelle Arabian Other Clinician: Referring Provider: Gaynelle Arabian Treating Provider/Extender: Melburn Hake, Rey Fors Weeks in Treatment: 11 History of Present Illness Location: left lower extremity swelling with ulceration on the posterior calf Modifying Factors: Other treatment(s) tried include:and seen his PCP who thought there was cellulitis and infection and put him on doxycycline for 2 weeks HPI Description: 84 year old gentleman has been referred to [place] by his PCP Dr. Gaynelle Arabian, for a ulcer on the left posterior lower leg which has been there for about 6 weeks. The patient was thought to have an infection and was put on doxycycline for 14 days and referred to the wound center. He has a past medical history of paroxysmal atrial fibrillation, hypertension, diverticulosis, status post right hip replacement, appendectomy. He has not been a smoker since 3. the anterior shin wound has been more recent about 2 weeks and this was caused probably due to a blunt abrasion. 03/06/17 on evaluation today patient's ulcer appears to show signs of excellent improvement. He is definitely showing additional evidence of granulation which is  great news. Overall I'm extremely pleased with the progress that he has been making from the beginning to where we are right now. With that being said I would like to potentially try different dressing to see if we could stimulate this to heal even faster. 03/13/17 on evaluation today patient appears to be doing little better in regard to his leg ulcer at this time. He  has been tolerating the dressing changes without complication. There does not appear to be any evidence of infection which is good news. He does have some additional granulation observed at this point and I do believe that the silver collagen dressing has been of benefit in this regard. Overall I'm very pleased with the progress she's made since last visit the only downside is that he does have additional maceration due to drainage compared to previous we may need to do something to address this. Electronic Signature(s) Signed: 03/13/2017 3:03:07 PM By: Worthy Keeler PA-C Entered By: Worthy Keeler on 03/13/2017 11:19:33 [name] ([id number]) -------------------------------------------------------------------------------- Physical Exam Details Patient Name: [name] Date of Service: 03/13/2017 10:30 AM Medical Record Number: [id number] Patient Account Number: [id number] Date of Birth/Sex: 07-Feb-1930 (84 y.o. Male) Treating RN: Roger Shelter Primary Care Provider: Gaynelle Arabian Other Clinician: Referring Provider: Gaynelle Arabian Treating Provider/Extender: STONE III, Princess Karnes Weeks in Treatment: 85 Constitutional Well-nourished and well-hydrated in no acute distress. Respiratory normal breathing without difficulty. Psychiatric this patient is able to make decisions and demonstrates good insight into disease process. Alert and Oriented x 3. pleasant and cooperative. Notes Patient's wound shows no evidence of infection at this point there is no purulent drainage obviously observed at this time. He did have some  Slough covering the wound bed which did required debridement today and post debridement he appeared to be doing much better. In fact there definitely was granulation around the border of the wound which had not previously been there. Electronic Signature(s) Signed: 03/13/2017 3:03:07 PM By: Worthy Keeler PA-C Entered By: Worthy Keeler on 03/13/2017 11:20:33 [name] ([id number]) -------------------------------------------------------------------------------- Physician Orders Details Patient Name: [name] Date of Service: 03/13/2017 10:30 AM Medical Record Number: [id number] Patient Account Number: [id number] Date of Birth/Sex: November 09, 1930 (84 y.o. Male) Treating RN: Roger Shelter Primary Care Provider: Gaynelle Arabian Other Clinician: Referring Provider: Gaynelle Arabian Treating Provider/Extender: Melburn Hake, Sofie Schendel Weeks in Treatment: 11 Verbal / Phone Orders: No Diagnosis Coding ICD-10 Coding Code Description I89.0 Lymphedema, not elsewhere classified S81.812A Laceration without foreign body, left lower leg, initial encounter L97.222 Non-pressure chronic ulcer of left calf with fat layer exposed L97.221 Non-pressure chronic ulcer of left calf limited to breakdown of skin Wound Cleansing Wound #1 Left,Midline,Posterior Lower Leg o Clean wound with Normal Saline. o Cleanse wound with mild soap and water o May Shower, gently pat wound dry prior to applying new dressing. Anesthetic (add to Medication List) Wound #1 Left,Midline,Posterior Lower Leg o Topical Lidocaine 4% cream applied to wound bed prior to debridement (In Clinic Only). Primary Wound Dressing Wound #1 Left,Midline,Posterior Lower Leg o Prisma Ag - moisten with saline Secondary Dressing Wound #1 Left,Midline,Posterior Lower Leg o Boardered Foam Dressing o Drawtex Dressing Change Frequency Wound #1 Left,Midline,Posterior Lower Leg o Change dressing every other day. Follow-up  Appointments o Return Appointment in 2 weeks. Edema Control Wound #1 Left,Midline,Posterior Lower Leg o Patient to wear own compression stockings Additional Orders / Instructions Wound #1 Left,Midline,Posterior Lower Leg o Vitamin A; Vitamin C, Zinc o Increase protein intake. MAXWELL, LEMEN ([id number]) Electronic Signature(s) Signed: 03/13/2017 3:03:07 PM By: Worthy Keeler PA-C Signed: 03/15/2017 4:15:18 PM By: Roger Shelter Entered By: Roger Shelter on 03/13/2017 11:06:33 [name] ([id number]) -------------------------------------------------------------------------------- Problem List Details Patient Name: KYEN, TAITE. Date of Service: 03/13/2017 10:30 AM Medical Record Number: [id number] Patient Account Number: [id number] Date of Birth/Sex: 23-May-1930 (84 y.o. Male) Treating RN: Roger Shelter Primary Care Provider:  Gaynelle Arabian Other Clinician: Referring Provider: Gaynelle Arabian Treating Provider/Extender: Melburn Hake, Hau Sanor Weeks in Treatment: 11 Active Problems ICD-10 Encounter Code Description Active Date Diagnosis I89.0 Lymphedema, not elsewhere classified 12/22/2016 Yes V56.433I Laceration without foreign body, left lower leg, initial encounter 12/22/2016 Yes L97.222 Non-pressure chronic ulcer of left calf with fat layer exposed 12/22/2016 Yes L97.221 Non-pressure chronic ulcer of left calf limited to breakdown of skin 12/22/2016 Yes Inactive Problems Resolved Problems Electronic Signature(s) Signed: 03/13/2017 3:03:07 PM By: Worthy Keeler PA-C Entered By: Worthy Keeler on 03/13/2017 10:43:04 [name] ([id number]) -------------------------------------------------------------------------------- Progress Note Details Patient Name: [name] Date of Service: 03/13/2017 10:30 AM Medical Record Number: [id number] Patient Account Number: [id number] Date of Birth/Sex: May 06, 1930 (84 y.o. Male) Treating RN: Roger Shelter Primary Care  Provider: Gaynelle Arabian Other Clinician: Referring Provider: Gaynelle Arabian Treating Provider/Extender: Melburn Hake, Armonie Mettler Weeks in Treatment: 11 Subjective Chief Complaint Information obtained from Patient He is seen for follow up evaluation of lle ulcers History of Present Illness (HPI) The following HPI elements were documented for the patient's wound: Location: left lower extremity swelling with ulceration on the posterior calf Modifying Factors: Other treatment(s) tried include:and seen his PCP who thought there was cellulitis and infection and put him on doxycycline for 34 weeks 84 year old gentleman has been referred to [place] by his PCP Dr. Gaynelle Arabian, for a ulcer on the left posterior lower leg which has been there for about 6 weeks. The patient was thought to have an infection and was put on doxycycline for 14 days and referred to the wound center. He has a past medical history of paroxysmal atrial fibrillation, hypertension, diverticulosis, status post right hip replacement, appendectomy. He has not been a smoker since 77. the anterior shin wound has been more recent about 2 weeks and this was caused probably due to a blunt abrasion. 03/06/17 on evaluation today patient's ulcer appears to show signs of excellent improvement. He is definitely showing additional evidence of granulation which is great news. Overall I'm extremely pleased with the progress that he has been making from the beginning to where we are right now. With that being said I would like to potentially try different dressing to see if we could stimulate this to heal even faster. 03/13/17 on evaluation today patient appears to be doing little better in regard to his leg ulcer at this time. He has been tolerating the dressing changes without complication. There does not appear to be any evidence of infection which is good news. He does have some additional granulation observed at this point and I do believe that the  silver collagen dressing has been of benefit in this regard. Overall I'm very pleased with the progress she's made since last visit the only downside is that he does have additional maceration due to drainage compared to previous we may need to do something to address this. Patient History Information obtained from Patient. Family History Diabetes - Child, Hypertension - Child, No family history of Cancer, Heart Disease, Kidney Disease, Lung Disease, Seizures, Stroke, Thyroid Problems, Tuberculosis. Social History Former smoker - 60 years +, Marital Status - Married, Alcohol Use - Never, Drug Use - No History, Caffeine Use - Rarely. Review of Systems (ROS) Constitutional Symptoms (General Health) Denies complaints or symptoms of Fever, Chills. Respiratory The patient has no complaints or symptoms. Cardiovascular The patient has no complaints or symptoms. TEOFIL, MANIACI ([id number]) Psychiatric The patient has no complaints or symptoms. Objective Constitutional Well-nourished and well-hydrated in  no acute distress. Vitals Time Taken: 10:44 AM, Height: 72 in, Weight: 217 lbs, BMI: 29.4, Temperature: 97.7 F, Pulse: 89 bpm, Respiratory Rate: 16 breaths/min, Blood Pressure: 132/79 mmHg. Respiratory normal breathing without difficulty. Psychiatric this patient is able to make decisions and demonstrates good insight into disease process. Alert and Oriented x 3. pleasant and cooperative. General Notes: Patient's wound shows no evidence of infection at this point there is no purulent drainage obviously observed at this time. He did have some Slough covering the wound bed which did required debridement today and post debridement he appeared to be doing much better. In fact there definitely was granulation around the border of the wound which had not previously been there. Integumentary (Hair, Skin) Wound #1 status is Open. Original cause of wound was Gradually Appeared. The wound is  located on the Left,Midline,Posterior Lower Leg. The wound measures 1.7cm length x 2.6cm width x 0.2cm depth; 3.471cm^2 area and 0.694cm^3 volume. There is Fat Layer (Subcutaneous Tissue) Exposed exposed. There is no tunneling or undermining noted. There is a large amount of purulent drainage noted. The wound margin is distinct with the outline attached to the wound base. There is medium (34-66%) red granulation within the wound bed. There is a medium (34-66%) amount of necrotic tissue within the wound bed including Adherent Slough. The periwound skin appearance exhibited: Induration, Scarring, Maceration, Ecchymosis. The periwound skin appearance did not exhibit: Callus, Crepitus, Excoriation, Rash, Dry/Scaly, Atrophie Blanche, Cyanosis, Hemosiderin Staining, Mottled, Pallor, Rubor, Erythema. Periwound temperature was noted as No Abnormality. The periwound has tenderness on palpation. Assessment Active Problems ICD-10 I89.0 - Lymphedema, not elsewhere classified S81.812A - Laceration without foreign body, left lower leg, initial encounter L97.222 - Non-pressure chronic ulcer of left calf with fat layer exposed L97.221 - Non-pressure chronic ulcer of left calf limited to breakdown of skin JACEION, ADAY. ([id number]) Procedures Wound #1 Pre-procedure diagnosis of Wound #1 is a Venous Leg Ulcer located on the Left,Midline,Posterior Lower Leg .Severity of Tissue Pre Debridement is: Fat layer exposed. There was a Skin/Subcutaneous Tissue Debridement ([id number]) debridement with total area of 4.76 sq cm performed by STONE III, Juda Toepfer E., PA-C. with the following instrument(s): Curette to remove Viable and Non-Viable tissue/material including Fibrin/Slough, Skin, and Subcutaneous after achieving pain control using Other (lidocaine 4%). A time out was conducted at 11:02, prior to the start of the procedure. A Minimum amount of bleeding was controlled with Pressure. The procedure was tolerated well  with a pain level of 0 throughout and a pain level of 0 following the procedure. Post Debridement Measurements: 1.7cm length x 2.6cm width x 0.2cm depth; 0.694cm^3 volume. Character of Wound/Ulcer Post Debridement is stable. Severity of Tissue Post Debridement is: Fat layer exposed. Post procedure Diagnosis Wound #1: Same as Pre-Procedure Plan Wound Cleansing: Wound #1 Left,Midline,Posterior Lower Leg: Clean wound with Normal Saline. Cleanse wound with mild soap and water May Shower, gently pat wound dry prior to applying new dressing. Anesthetic (add to Medication List): Wound #1 Left,Midline,Posterior Lower Leg: Topical Lidocaine 4% cream applied to wound bed prior to debridement (In Clinic Only). Primary Wound Dressing: Wound #1 Left,Midline,Posterior Lower Leg: Prisma Ag - moisten with saline Secondary Dressing: Wound #1 Left,Midline,Posterior Lower Leg: Boardered Foam Dressing Drawtex Dressing Change Frequency: Wound #1 Left,Midline,Posterior Lower Leg: Change dressing every other day. Follow-up Appointments: Return Appointment in 2 weeks. Edema Control: Wound #1 Left,Midline,Posterior Lower Leg: Patient to wear own compression stockings Additional Orders / Instructions: Wound #1 Left,Midline,Posterior Lower Leg: Vitamin A; Vitamin  C, Zinc Increase protein intake. BECKETT, MADEN ([id number]) I am going to recommend that we continue with the Current wound care measures for the next week as patient seems to be making good progress at this point. They are in agreement with the plan. Please see above for specific wound care orders. We will see patient for re-evaluation in 1 week(s) here in the clinic. If anything worsens or changes patient will contact our office for additional recommendations. Electronic Signature(s) Signed: 03/13/2017 3:03:07 PM By: Worthy Keeler PA-C Entered By: Worthy Keeler on 03/13/2017 11:20:56 [name]  ([id number]) -------------------------------------------------------------------------------- ROS/PFSH Details Patient Name: [name] Date of Service: 03/13/2017 10:30 AM Medical Record Number: [id number] Patient Account Number: [id number] Date of Birth/Sex: 1930/08/28 (84 y.o. Male) Treating RN: Roger Shelter Primary Care Provider: Gaynelle Arabian Other Clinician: Referring Provider: Gaynelle Arabian Treating Provider/Extender: Melburn Hake, Adanna Zuckerman Weeks in Treatment: 11 Information Obtained From Patient Wound History Do you currently have one or more open woundso Yes How many open wounds do you currently haveo 2 Approximately how long have you had your woundso 4 weeks How have you been treating your wound(s) until nowo neosporin Has your wound(s) ever healed and then re-openedo No Have you had any lab work done in the past montho No Have you tested positive for an antibiotic resistant organism (MRSA, VRE)o No Have you tested positive for osteomyelitis (bone infection)o No Have you had any tests for circulation on your legso No Constitutional Symptoms (General Health) Complaints and Symptoms: Negative for: Fever; Chills Eyes Medical History: Negative for: Cataracts; Glaucoma; Optic Neuritis Ear/Nose/Mouth/Throat Medical History: Negative for: Chronic sinus problems/congestion; Middle ear problems Hematologic/Lymphatic Medical History: Negative for: Anemia; Hemophilia; Human Immunodeficiency Virus; Lymphedema; Sickle Cell Disease Respiratory Complaints and Symptoms: No Complaints or Symptoms Medical History: Negative for: Aspiration; Asthma; Chronic Obstructive Pulmonary Disease (COPD); Pneumothorax; Sleep Apnea; Tuberculosis Cardiovascular Complaints and Symptoms: No Complaints or Symptoms Medical History: Positive for: Arrhythmia - A- Fib Negative for: Angina; Congestive Heart Failure; Coronary Artery Disease; Deep Vein Thrombosis; Hypertension; LETICIA, MCDIARMID  ([id number]) Hypotension; Myocardial Infarction; Peripheral Arterial Disease; Peripheral Venous Disease; Phlebitis; Vasculitis Gastrointestinal Medical History: Negative for: Cirrhosis ; Colitis; Crohnos; Hepatitis A; Hepatitis B; Hepatitis C Endocrine Medical History: Negative for: Type I Diabetes; Type II Diabetes Genitourinary Medical History: Negative for: End Stage Renal Disease Immunological Medical History: Negative for: Lupus Erythematosus; Raynaudos Integumentary (Skin) Medical History: Negative for: History of Burn; History of pressure wounds Musculoskeletal Medical History: Negative for: Gout; Rheumatoid Arthritis; Osteoarthritis; Osteomyelitis Neurologic Medical History: Negative for: Dementia; Neuropathy; Quadriplegia; Paraplegia; Seizure Disorder Psychiatric Complaints and Symptoms: No Complaints or Symptoms Medical History: Negative for: Anorexia/bulimia; Confinement Anxiety Immunizations Pneumococcal Vaccine: Received Pneumococcal Vaccination: No Implantable Devices Family and Social History Cancer: No; Diabetes: Yes - Child; Heart Disease: No; Hypertension: Yes - Child; Kidney Disease: No; Lung Disease: No; Seizures: No; Stroke: No; Thyroid Problems: No; Tuberculosis: No; Former smoker - 42 years +; Marital Status - Married; Alcohol Use: Never; Drug Use: No History; Caffeine Use: Rarely; Financial Concerns: No; Food, Clothing or Shelter Needs: No; Support System Lacking: No; Transportation Concerns: No; Advanced Directives: No; Patient does not want information on Advanced Directives; Do not resuscitate: No; Living Will: No; Medical Power of Attorney: No Physician Affirmation VAUN, HYNDMAN ([id number]) I have reviewed and agree with the above information. Electronic Signature(s) Signed: 03/13/2017 3:03:07 PM By: Worthy Keeler PA-C Signed: 03/15/2017 4:15:18 PM By: Roger Shelter Entered By: Worthy Keeler on 03/13/2017 11:19:54  DEMBA, NIGH  ([id number]) -------------------------------------------------------------------------------- SuperBill Details Patient Name: JACUB, WAITERS. Date of Service: 03/13/2017 Medical Record Number: [id number] Patient Account Number: [id number] Date of Birth/Sex: June 16, 1930 (84 y.o. Male) Treating RN: Roger Shelter Primary Care Provider: Gaynelle Arabian Other Clinician: Referring Provider: Gaynelle Arabian Treating Provider/Extender: Melburn Hake, Chi Garlow Weeks in Treatment: 11 Diagnosis Coding ICD-10 Codes Code Description I89.0 Lymphedema, not elsewhere classified S81.812A Laceration without foreign body, left lower leg, initial encounter L97.222 Non-pressure chronic ulcer of left calf with fat layer exposed L97.221 Non-pressure chronic ulcer of left calf limited to breakdown of skin Facility Procedures CPT4 Code: 16967893 Description: 11042 - DEB SUBQ TISSUE 20 SQ CM/< ICD-10 Diagnosis Description L97.222 Non-pressure chronic ulcer of left calf with fat layer expo Modifier: sed Quantity: 1 Physician Procedures CPT4 Code: 8101751 Description: 02585 - WC PHYS SUBQ TISS 20 SQ CM ICD-10 Diagnosis Description L97.222 Non-pressure chronic ulcer of left calf with fat layer expo Modifier: sed Quantity: 1 Electronic Signature(s) Signed: 03/13/2017 3:03:07 PM By: Worthy Keeler PA-C Entered By: Worthy Keeler on 03/13/2017 11:21:11

## 2017-03-24 ENCOUNTER — Ambulatory Visit: Payer: Medicare Other | Admitting: *Deleted

## 2017-03-24 DIAGNOSIS — I4891 Unspecified atrial fibrillation: Secondary | ICD-10-CM | POA: Diagnosis not present

## 2017-03-24 DIAGNOSIS — Z5181 Encounter for therapeutic drug level monitoring: Secondary | ICD-10-CM

## 2017-03-24 DIAGNOSIS — I6789 Other cerebrovascular disease: Secondary | ICD-10-CM | POA: Diagnosis not present

## 2017-03-24 LAB — POCT INR: INR: 2.3

## 2017-03-24 NOTE — Patient Instructions (Signed)
Description   Continue taking 1 tablet daily except 1 and 1/2 tablets on Sundays and  Wednesdays.  Recheck in 5 weeks. Call us with any medication changes [phone]

## 2017-03-27 ENCOUNTER — Encounter: Payer: Medicare Other | Admitting: Physician Assistant

## 2017-03-27 DIAGNOSIS — E11622 Type 2 diabetes mellitus with other skin ulcer: Secondary | ICD-10-CM | POA: Diagnosis not present

## 2017-03-28 NOTE — Progress Notes (Signed)
[name] ([id number]) Visit Report for 03/27/2017 Chief Complaint Document Details Patient Name: [name], [name]. Date of Service: 03/27/2017 9:30 AM Medical Record Number: [id number] Patient Account Number: [id number] Date of Birth/Sex: 06-Jan-1931 (84 y.o. Male) Treating RN: Roger Shelter Primary Care Provider: Gaynelle Arabian Other Clinician: Referring Provider: Gaynelle Arabian Treating Provider/Extender: Melburn Hake, Rohini Jaroszewski Weeks in Treatment: 13 Information Obtained from: Patient Chief Complaint He is seen for follow up evaluation of lle ulcers Electronic Signature(s) Signed: 03/28/2017 12:14:27 AM By: Worthy Keeler PA-C Entered By: Worthy Keeler on 03/27/2017 09:52:47 [name] ([id number]) -------------------------------------------------------------------------------- HPI Details Patient Name: [name] Date of Service: 03/27/2017 9:30 AM Medical Record Number: [id number] Patient Account Number: [id number] Date of Birth/Sex: 1930-07-22 (84 y.o. Male) Treating RN: Roger Shelter Primary Care Provider: Gaynelle Arabian Other Clinician: Referring Provider: Gaynelle Arabian Treating Provider/Extender: Melburn Hake, Zenaya Ulatowski Weeks in Treatment: 13 History of Present Illness Location: left lower extremity swelling with ulceration on the posterior calf Modifying Factors: Other treatment(s) tried include:and seen his PCP who thought there was cellulitis and infection and put him on doxycycline for 2 weeks HPI Description: 84 year old gentleman has been referred to [place] by his PCP Dr. Gaynelle Arabian, for a ulcer on the left posterior lower leg which has been there for about 6 weeks. The patient was thought to have an infection and was put on doxycycline for 14 days and referred to the wound center. He has a past medical history of paroxysmal atrial fibrillation, hypertension, diverticulosis, status post right hip replacement, appendectomy. He has not been a smoker since  89. the anterior shin wound has been more recent about 2 weeks and this was caused probably due to a blunt abrasion. 03/06/17 on evaluation today patient's ulcer appears to show signs of excellent improvement. He is definitely showing additional evidence of granulation which is great news. Overall I'm extremely pleased with the progress that he has been making from the beginning to where we are right now. With that being said I would like to potentially try different dressing to see if we could stimulate this to heal even faster. 03/13/17 on evaluation today patient appears to be doing little better in regard to his leg ulcer at this time. He has been tolerating the dressing changes without complication. There does not appear to be any evidence of infection which is good news. He does have some additional granulation observed at this point and I do believe that the silver collagen dressing has been of benefit in this regard. Overall I'm very pleased with the progress she's made since last visit the only downside is that he does have additional maceration due to drainage compared to previous we may need to do something to address this. 03/27/17 on evaluation today patient appears to be doing well in regard to his left posterior lower surety ulcer. He has a significant area of epithelial growth which seems to be doing excellent at this point in time. He also seems to be well controlled as far as slough is concerned and drainage there is no evidence of maceration and there really was no slough that could not be cleaned off with saline and gauze. Overall patient seems to be making excellent progress. Electronic Signature(s) Signed: 03/28/2017 12:14:27 AM By: Worthy Keeler PA-C Entered By: Worthy Keeler on 03/27/2017 10:17:30 [name] ([id number]) -------------------------------------------------------------------------------- Physical Exam Details Patient Name: [name] Date of  Service: 03/27/2017 9:30 AM Medical Record Number: [id number] Patient Account Number: [id number]  Date of Birth/Sex: Jan 15, 1930 (84 y.o. Male) Treating RN: Roger Shelter Primary Care Provider: Gaynelle Arabian Other Clinician: Referring Provider: Gaynelle Arabian Treating Provider/Extender: STONE III, Shawnia Vizcarrondo Weeks in Treatment: 43 Constitutional Well-nourished and well-hydrated in no acute distress. Respiratory normal breathing without difficulty. clear to auscultation bilaterally. Cardiovascular regular rate and rhythm with normal S1, S2. 1+ pitting edema of the bilateral lower extremities. Psychiatric this patient is able to make decisions and demonstrates good insight into disease process. Alert and Oriented x 3. pleasant and cooperative. Notes Patientos wound on evaluation today just had some Slough on the surface that required mechanical debridement with saline and gauze. Otherwise there is no need for sharp debridement at this point as he seems to be doing very well and he showed excellent signs of epithelialization along the border of the wound. He has good granular tissue the only issue could be some issue with hyper granulation although depending on how things progress we may switch to [facility] Dressing right now I'm pleased with the Prisma and the Drawtex overlying. Electronic Signature(s) Signed: 03/28/2017 12:14:27 AM By: Worthy Keeler PA-C Entered By: Worthy Keeler on 03/27/2017 10:18:47 [name] ([id number]) -------------------------------------------------------------------------------- Physician Orders Details Patient Name: [name] Date of Service: 03/27/2017 9:30 AM Medical Record Number: [id number] Patient Account Number: [id number] Date of Birth/Sex: 07-13-1930 (84 y.o. Male) Treating RN: Roger Shelter Primary Care Provider: Gaynelle Arabian Other Clinician: Referring Provider: Gaynelle Arabian Treating Provider/Extender: Melburn Hake, Kasean Denherder Weeks  in Treatment: 70 Verbal / Phone Orders: No Diagnosis Coding ICD-10 Coding Code Description I89.0 Lymphedema, not elsewhere classified S81.812A Laceration without foreign body, left lower leg, initial encounter L97.222 Non-pressure chronic ulcer of left calf with fat layer exposed L97.221 Non-pressure chronic ulcer of left calf limited to breakdown of skin Wound Cleansing Wound #1 Left,Midline,Posterior Lower Leg o Clean wound with Normal Saline. o Cleanse wound with mild soap and water o May Shower, gently pat wound dry prior to applying new dressing. Anesthetic (add to Medication List) Wound #1 Left,Midline,Posterior Lower Leg o Topical Lidocaine 4% cream applied to wound bed prior to debridement (In Clinic Only). Primary Wound Dressing Wound #1 Left,Midline,Posterior Lower Leg o Prisma Ag - moisten with saline Secondary Dressing Wound #1 Left,Midline,Posterior Lower Leg o Boardered Foam Dressing o Drawtex Dressing Change Frequency Wound #1 Left,Midline,Posterior Lower Leg o Change dressing every other day. Follow-up Appointments o Return Appointment in 2 weeks. Edema Control Wound #1 Left,Midline,Posterior Lower Leg o Patient to wear own compression stockings Additional Orders / Instructions Wound #1 Left,Midline,Posterior Lower Leg o Vitamin A; Vitamin C, Zinc o Increase protein intake. AMARII, BORDAS ([id number]) Electronic Signature(s) Signed: 03/27/2017 4:59:35 PM By: Roger Shelter Signed: 03/28/2017 12:14:27 AM By: Worthy Keeler PA-C Entered By: Roger Shelter on 03/27/2017 10:10:36 [name] ([id number]) -------------------------------------------------------------------------------- Problem List Details Patient Name: JOASH, TONY. Date of Service: 03/27/2017 9:30 AM Medical Record Number: [id number] Patient Account Number: [id number] Date of Birth/Sex: [birth year] (84 y.o. Male) Treating RN: Roger Shelter Primary Care  Provider: Gaynelle Arabian Other Clinician: Referring Provider: Gaynelle Arabian Treating Provider/Extender: Melburn Hake, Nehemyah Foushee Weeks in Treatment: 13 Active Problems ICD-10 Encounter Code Description Active Date Diagnosis I89.0 Lymphedema, not elsewhere classified 12/22/2016 Yes F79.024O Laceration without foreign body, left lower leg, initial encounter 12/22/2016 Yes L97.222 Non-pressure chronic ulcer of left calf with fat layer exposed 12/22/2016 Yes L97.221 Non-pressure chronic ulcer of left calf limited to breakdown of skin 12/22/2016 Yes Inactive Problems Resolved Problems Electronic Signature(s) Signed: 03/28/2017 12:14:27  AM By: Worthy Keeler PA-C Entered By: Worthy Keeler on 03/27/2017 09:52:40 [name] ([id number]) -------------------------------------------------------------------------------- Progress Note Details Patient Name: VIRGLE, ARTH. Date of Service: 03/27/2017 9:30 AM Medical Record Number: [id number] Patient Account Number: [id number] Date of Birth/Sex: 09-09-1930 (84 y.o. Male) Treating RN: Roger Shelter Primary Care Provider: Gaynelle Arabian Other Clinician: Referring Provider: Gaynelle Arabian Treating Provider/Extender: Melburn Hake, Aizlyn Schifano Weeks in Treatment: 13 Subjective Chief Complaint Information obtained from Patient He is seen for follow up evaluation of lle ulcers History of Present Illness (HPI) The following HPI elements were documented for the patient's wound: Location: left lower extremity swelling with ulceration on the posterior calf Modifying Factors: Other treatment(s) tried include:and seen his PCP who thought there was cellulitis and infection and put him on doxycycline for 42 weeks 83 year old gentleman has been referred to [place] by his PCP Dr. Gaynelle Arabian, for a ulcer on the left posterior lower leg which has been there for about 6 weeks. The patient was thought to have an infection and was put on doxycycline for 14 days  and referred to the wound center. He has a past medical history of paroxysmal atrial fibrillation, hypertension, diverticulosis, status post right hip replacement, appendectomy. He has not been a smoker since 51. the anterior shin wound has been more recent about 2 weeks and this was caused probably due to a blunt abrasion. 03/06/17 on evaluation today patient's ulcer appears to show signs of excellent improvement. He is definitely showing additional evidence of granulation which is great news. Overall I'm extremely pleased with the progress that he has been making from the beginning to where we are right now. With that being said I would like to potentially try different dressing to see if we could stimulate this to heal even faster. 03/13/17 on evaluation today patient appears to be doing little better in regard to his leg ulcer at this time. He has been tolerating the dressing changes without complication. There does not appear to be any evidence of infection which is good news. He does have some additional granulation observed at this point and I do believe that the silver collagen dressing has been of benefit in this regard. Overall I'm very pleased with the progress she's made since last visit the only downside is that he does have additional maceration due to drainage compared to previous we may need to do something to address this. 03/27/17 on evaluation today patient appears to be doing well in regard to his left posterior lower surety ulcer. He has a significant area of epithelial growth which seems to be doing excellent at this point in time. He also seems to be well controlled as far as slough is concerned and drainage there is no evidence of maceration and there really was no slough that could not be cleaned off with saline and gauze. Overall patient seems to be making excellent progress. Patient History Information obtained from Patient. Family History Diabetes - Child, Hypertension -  Child, No family history of Cancer, Heart Disease, Kidney Disease, Lung Disease, Seizures, Stroke, Thyroid Problems, Tuberculosis. Social History Former smoker - 90 years +, Marital Status - Married, Alcohol Use - Never, Drug Use - No History, Caffeine Use - Rarely. Review of Systems (ROS) Constitutional Symptoms (General Health) BENETT, SWOYER ([id number]) Denies complaints or symptoms of Fever, Chills. Respiratory The patient has no complaints or symptoms. Cardiovascular Complains or has symptoms of LE edema. Psychiatric The patient has no complaints or symptoms. Objective Constitutional Well-nourished and  well-hydrated in no acute distress. Vitals Time Taken: 9:44 AM, Height: 72 in, Weight: 217 lbs, BMI: 29.4, Temperature: 97.9 F, Pulse: 97 bpm, Respiratory Rate: 18 breaths/min, Blood Pressure: 135/62 mmHg, Pulse Oximetry: 98 %. Respiratory normal breathing without difficulty. clear to auscultation bilaterally. Cardiovascular regular rate and rhythm with normal S1, S2. 1+ pitting edema of the bilateral lower extremities. Psychiatric this patient is able to make decisions and demonstrates good insight into disease process. Alert and Oriented x 3. pleasant and cooperative. General Notes: Patient s wound on evaluation today just had some Slough on the surface that required mechanical debridement with saline and gauze. Otherwise there is no need for sharp debridement at this point as he seems to be doing very well and he showed excellent signs of epithelialization along the border of the wound. He has good granular tissue the only issue could be some issue with hyper granulation although depending on how things progress we may switch to [facility] Dressing right now I'm pleased with the Prisma and the Drawtex overlying. Integumentary (Hair, Skin) Wound #1 status is Open. Original cause of wound was Gradually Appeared. The wound is located on the Left,Midline,Posterior Lower  Leg. The wound measures 1.5cm length x 2.3cm width x 0.2cm depth; 2.71cm^2 area and 0.542cm^3 volume. There is Fat Layer (Subcutaneous Tissue) Exposed exposed. There is no tunneling or undermining noted. There is a large amount of serous drainage noted. The wound margin is distinct with the outline attached to the wound base. There is medium (34-66%) red, hyper - granulation within the wound bed. There is a medium (34-66%) amount of necrotic tissue within the wound bed including Adherent Slough. The periwound skin appearance exhibited: Induration, Scarring, Maceration, Ecchymosis. The periwound skin appearance did not exhibit: Callus, Crepitus, Excoriation, Rash, Dry/Scaly, Atrophie Blanche, Cyanosis, Hemosiderin Staining, Mottled, Pallor, Rubor, Erythema. Periwound temperature was noted as No Abnormality. The periwound has tenderness on palpation. Assessment DIMAS, SCHECK ([id number]) Active Problems ICD-10 I89.0 - Lymphedema, not elsewhere classified S81.812A - Laceration without foreign body, left lower leg, initial encounter L97.222 - Non-pressure chronic ulcer of left calf with fat layer exposed L97.221 - Non-pressure chronic ulcer of left calf limited to breakdown of skin Plan Wound Cleansing: Wound #1 Left,Midline,Posterior Lower Leg: Clean wound with Normal Saline. Cleanse wound with mild soap and water May Shower, gently pat wound dry prior to applying new dressing. Anesthetic (add to Medication List): Wound #1 Left,Midline,Posterior Lower Leg: Topical Lidocaine 4% cream applied to wound bed prior to debridement (In Clinic Only). Primary Wound Dressing: Wound #1 Left,Midline,Posterior Lower Leg: Prisma Ag - moisten with saline Secondary Dressing: Wound #1 Left,Midline,Posterior Lower Leg: Boardered Foam Dressing Drawtex Dressing Change Frequency: Wound #1 Left,Midline,Posterior Lower Leg: Change dressing every other day. Follow-up Appointments: Return Appointment in 2  weeks. Edema Control: Wound #1 Left,Midline,Posterior Lower Leg: Patient to wear own compression stockings Additional Orders / Instructions: Wound #1 Left,Midline,Posterior Lower Leg: Vitamin A; Vitamin C, Zinc Increase protein intake. Currently I'm going to the suggest we continue with the Current wound care measures we will also see the patient for follow- up visit in two weeks time to see were things stand. He's in agreement this plan and overall since he's showing signs of excellent improvement I think this is definitely appropriate. Please see above for specific wound care orders. We will see patient for re-evaluation in 2 week(s) here in the clinic. If anything worsens or changes patient will contact our office for additional recommendations. Electronic Signature(s) [employer]  Lenna Sciara ([id number]) Signed: 03/28/2017 12:14:27 AM By: Worthy Keeler PA-C Entered By: Worthy Keeler on 03/27/2017 10:19:20 [name] ([id number]) -------------------------------------------------------------------------------- ROS/PFSH Details Patient Name: [name] Date of Service: 03/27/2017 9:30 AM Medical Record Number: [id number] Patient Account Number: [id number] Date of Birth/Sex: 06-08-1930 (84 y.o. Male) Treating RN: Roger Shelter Primary Care Provider: Gaynelle Arabian Other Clinician: Referring Provider: Gaynelle Arabian Treating Provider/Extender: Melburn Hake, Meridee Branum Weeks in Treatment: 13 Information Obtained From Patient Wound History Do you currently have one or more open woundso Yes How many open wounds do you currently haveo 2 Approximately how long have you had your woundso 4 weeks How have you been treating your wound(s) until nowo neosporin Has your wound(s) ever healed and then re-openedo No Have you had any lab work done in the past montho No Have you tested positive for an antibiotic resistant organism (MRSA, VRE)o No Have you tested positive for osteomyelitis (bone  infection)o No Have you had any tests for circulation on your legso No Constitutional Symptoms (General Health) Complaints and Symptoms: Negative for: Fever; Chills Cardiovascular Complaints and Symptoms: Positive for: LE edema Medical History: Positive for: Arrhythmia - A- Fib Negative for: Angina; Congestive Heart Failure; Coronary Artery Disease; Deep Vein Thrombosis; Hypertension; Hypotension; Myocardial Infarction; Peripheral Arterial Disease; Peripheral Venous Disease; Phlebitis; Vasculitis Eyes Medical History: Negative for: Cataracts; Glaucoma; Optic Neuritis Ear/Nose/Mouth/Throat Medical History: Negative for: Chronic sinus problems/congestion; Middle ear problems Hematologic/Lymphatic Medical History: Negative for: Anemia; Hemophilia; Human Immunodeficiency Virus; Lymphedema; Sickle Cell Disease Respiratory Complaints and Symptoms: No Complaints or Symptoms Medical History: Negative for: Aspiration; Asthma; Chronic Obstructive Pulmonary Disease (COPD); Pneumothorax; Sleep Apnea; AMORY, SIMONETTI ([id number]) Tuberculosis Gastrointestinal Medical History: Negative for: Cirrhosis ; Colitis; Crohnos; Hepatitis A; Hepatitis B; Hepatitis C Endocrine Medical History: Negative for: Type I Diabetes; Type II Diabetes Genitourinary Medical History: Negative for: End Stage Renal Disease Immunological Medical History: Negative for: Lupus Erythematosus; Raynaudos Integumentary (Skin) Medical History: Negative for: History of Burn; History of pressure wounds Musculoskeletal Medical History: Negative for: Gout; Rheumatoid Arthritis; Osteoarthritis; Osteomyelitis Neurologic Medical History: Negative for: Dementia; Neuropathy; Quadriplegia; Paraplegia; Seizure Disorder Psychiatric Complaints and Symptoms: No Complaints or Symptoms Medical History: Negative for: Anorexia/bulimia; Confinement Anxiety Immunizations Pneumococcal Vaccine: Received Pneumococcal Vaccination:  No Implantable Devices Family and Social History Cancer: No; Diabetes: Yes - Child; Heart Disease: No; Hypertension: Yes - Child; Kidney Disease: No; Lung Disease: No; Seizures: No; Stroke: No; Thyroid Problems: No; Tuberculosis: No; Former smoker - 93 years +; Marital Status - Married; Alcohol Use: Never; Drug Use: No History; Caffeine Use: Rarely; Financial Concerns: No; Food, Clothing or Shelter Needs: No; Support System Lacking: No; Transportation Concerns: No; Advanced Directives: No; Patient does not want information on Advanced Directives; Do not resuscitate: No; Living Will: No; Medical Power of Attorney: No Physician Affirmation MESHULEM, ONORATO ([id number]) I have reviewed and agree with the above information. Electronic Signature(s) Signed: 03/27/2017 4:59:35 PM By: Roger Shelter Signed: 03/28/2017 12:14:27 AM By: Worthy Keeler PA-C Entered By: Worthy Keeler on 03/27/2017 10:17:56 [name] ([id number]) -------------------------------------------------------------------------------- SuperBill Details Patient Name: [name] Date of Service: 03/27/2017 Medical Record Number: [id number] Patient Account Number: [id number] Date of Birth/Sex: [birth year] (84 y.o. Male) Treating RN: Roger Shelter Primary Care Provider: Gaynelle Arabian Other Clinician: Referring Provider: Gaynelle Arabian Treating Provider/Extender: Melburn Hake, Floyde Dingley Weeks in Treatment: 13 Diagnosis Coding ICD-10 Codes Code Description I89.0 Lymphedema, not elsewhere classified S81.812A Laceration without foreign body, left lower leg, initial encounter L97.222 Non-pressure chronic  ulcer of left calf with fat layer exposed L97.221 Non-pressure chronic ulcer of left calf limited to breakdown of skin Facility Procedures CPT4 Code: [id number] Description: 99213 - WOUND CARE VISIT-LEV 3 EST PT Modifier: Quantity: 1 Physician Procedures CPT4 Code: [id number] Description: 51834 - WC PHYS LEVEL 3 - EST PT  ICD-10 Diagnosis Description I89.0 Lymphedema, not elsewhere classified S81.812A Laceration without foreign body, left lower leg, initial enc L97.222 Non-pressure chronic ulcer of left calf with fat layer  expos L97.221 Non-pressure chronic ulcer of left calf limited to breakdown Modifier: ounter ed of skin Quantity: 1 Electronic Signature(s) Signed: 03/28/2017 12:14:27 AM By: Worthy Keeler PA-C Entered By: Worthy Keeler on 03/27/2017 10:19:37

## 2017-03-28 NOTE — Progress Notes (Signed)
RAFFERTY, [name] ([id number]) Visit Report for 03/27/2017 Arrival Information Details Patient Name: [name], [name]. Date of Service: 03/27/2017 9:30 AM Medical Record Number: [id number] Patient Account Number: [id number] Date of Birth/Sex: 09/24/1930 (84 y.o. Male) Treating RN: Cornell Barman Primary Care Elridge Stemm: Gaynelle Arabian Other Clinician: Referring Demetreus Lothamer: Gaynelle Arabian Treating Carina Chaplin/Extender: Melburn Hake, HOYT Weeks in Treatment: 57 Visit Information History Since Last Visit Added or deleted any medications: No Patient Arrived: Ambulatory Any new allergies or adverse reactions: No Arrival Time: 09:43 Had a fall or experienced change in No Accompanied By: wife activities of daily living that may affect Transfer Assistance: None risk of falls: Patient Identification Verified: Yes Signs or symptoms of abuse/neglect since last visito No Secondary Verification Process Completed: Yes Hospitalized since last visit: No Patient Requires Transmission-Based No Has Dressing in Place as Prescribed: No Precautions: Pain Present Now: No Patient Has Alerts: No Electronic Signature(s) Signed: 03/27/2017 11:38:11 AM By: Gretta Cool, BSN, RN, CWS, Kim RN, BSN Entered By: Gretta Cool, BSN, RN, CWS, Kim on 03/27/2017 09:44:18 [name] ([id number]) -------------------------------------------------------------------------------- Clinic Level of Care Assessment Details Patient Name: [name], [name]. Date of Service: 03/27/2017 9:30 AM Medical Record Number: [id number] Patient Account Number: [id number] Date of Birth/Sex: 1930/07/04 (84 y.o. Male) Treating RN: Roger Shelter Primary Care Merlina Marchena: Gaynelle Arabian Other Clinician: Referring Emorie Mcfate: Gaynelle Arabian Treating Robby Bulkley/Extender: Melburn Hake, HOYT Weeks in Treatment: 13 Clinic Level of Care Assessment Items TOOL 4 Quantity Score X - Use when only an EandM is performed on FOLLOW-UP visit 1 0 ASSESSMENTS - Nursing Assessment /  Reassessment X - Reassessment of Co-morbidities (includes updates in patient status) 1 10 X- 1 5 Reassessment of Adherence to Treatment Plan ASSESSMENTS - Wound and Skin Assessment / Reassessment X - Simple Wound Assessment / Reassessment - one wound 1 5 []  - 0 Complex Wound Assessment / Reassessment - multiple wounds []  - 0 Dermatologic / Skin Assessment (not related to wound area) ASSESSMENTS - Focused Assessment []  - Circumferential Edema Measurements - multi extremities 0 []  - 0 Nutritional Assessment / Counseling / Intervention []  - 0 Lower Extremity Assessment (monofilament, tuning fork, pulses) []  - 0 Peripheral Arterial Disease Assessment (using hand held doppler) ASSESSMENTS - Ostomy and/or Continence Assessment and Care []  - Incontinence Assessment and Management 0 []  - 0 Ostomy Care Assessment and Management (repouching, etc.) PROCESS - Coordination of Care X - Simple Patient / Family Education for ongoing care 1 15 []  - 0 Complex (extensive) Patient / Family Education for ongoing care []  - 0 Staff obtains Programmer, systems, Records, Test Results / Process Orders []  - 0 Staff telephones HHA, Nursing Homes / Clarify orders / etc []  - 0 Routine Transfer to another Facility (non-emergent condition) []  - 0 Routine Hospital Admission (non-emergent condition) []  - 0 New Admissions / Biomedical engineer / Ordering NPWT, Apligraf, etc. []  - 0 Emergency Hospital Admission (emergent condition) X- 1 10 Simple Discharge Coordination [name], [name] ([id number]) []  - 0 Complex (extensive) Discharge Coordination PROCESS - Special Needs []  - Pediatric / Minor Patient Management 0 []  - 0 Isolation Patient Management []  - 0 Hearing / Language / Visual special needs []  - 0 Assessment of Community assistance (transportation, D/C planning, etc.) []  - 0 Additional assistance / Altered mentation []  - 0 Support Surface(s) Assessment (bed, cushion, seat, etc.) INTERVENTIONS -  Wound Cleansing / Measurement X - Simple Wound Cleansing - one wound 1 5 []  - 0 Complex Wound Cleansing - multiple wounds X- 1 5 Wound Imaging (photographs -  any number of wounds) []  - 0 Wound Tracing (instead of photographs) X- 1 5 Simple Wound Measurement - one wound []  - 0 Complex Wound Measurement - multiple wounds INTERVENTIONS - Wound Dressings X - Small Wound Dressing one or multiple wounds 1 10 []  - 0 Medium Wound Dressing one or multiple wounds []  - 0 Large Wound Dressing one or multiple wounds []  - 0 Application of Medications - topical []  - 0 Application of Medications - injection INTERVENTIONS - Miscellaneous []  - External ear exam 0 []  - 0 Specimen Collection (cultures, biopsies, blood, body fluids, etc.) []  - 0 Specimen(s) / Culture(s) sent or taken to Lab for analysis []  - 0 Patient Transfer (multiple staff / Civil Service fast streamer / Similar devices) X- 1 5 Simple Staple / Suture removal (25 or less) []  - 0 Complex Staple / Suture removal (26 or more) []  - 0 Hypo / Hyperglycemic Management (close monitor of Blood Glucose) []  - 0 Ankle / Brachial Index (ABI) - do not check if billed separately X- 1 5 Vital Signs [name], Danny J. ([id number]) Has the patient been seen at the hospital within the last three years: Yes Total Score: 80 Level Of Care: New/Established - Level 3 Electronic Signature(s) Signed: 03/27/2017 4:59:35 PM By: Roger Shelter Entered By: Roger Shelter on 03/27/2017 10:11:05 [name] ([id number]) -------------------------------------------------------------------------------- Encounter Discharge Information Details Patient Name: [name] Date of Service: 03/27/2017 9:30 AM Medical Record Number: [id number] Patient Account Number: [id number] Date of Birth/Sex: 05/29/1930 (84 y.o. Male) Treating RN: Roger Shelter Primary Care Shalen Petrak: Gaynelle Arabian Other Clinician: Referring Josefita Weissmann: Gaynelle Arabian Treating  Macguire Holsinger/Extender: Melburn Hake, HOYT Weeks in Treatment: 13 Encounter Discharge Information Items Discharge Pain Level: 0 Discharge Condition: Stable Ambulatory Status: Ambulatory Discharge Destination: Home Transportation: Private Auto Accompanied By: spouse Schedule Follow-up Appointment: Yes Medication Reconciliation completed and No provided to Patient/Care Eugene Isadore: Provided on Clinical Summary of Care: 03/27/2017 Form Type Recipient Paper Patient AN Electronic Signature(s) Signed: 03/27/2017 10:29:04 AM By: Montey Hora Entered By: Montey Hora on 03/27/2017 10:29:04 [name] ([id number]) -------------------------------------------------------------------------------- Lower Extremity Assessment Details Patient Name: [name] Date of Service: 03/27/2017 9:30 AM Medical Record Number: [id number] Patient Account Number: [id number] Date of Birth/Sex: 10/28/1930 (84 y.o. Male) Treating RN: Cornell Barman Primary Care Giavanni Zeitlin: Gaynelle Arabian Other Clinician: Referring Deddrick Saindon: Gaynelle Arabian Treating Keyri Salberg/Extender: Melburn Hake, HOYT Weeks in Treatment: 13 Edema Assessment Assessed: [Left: No] [Right: No] [Left: Edema] [Right: :] Calf Left: Right: Point of Measurement: 34 cm From Medial Instep 37.5 cm cm Ankle Left: Right: Point of Measurement: 13 cm From Medial Instep 24.6 cm cm Vascular Assessment Claudication: Claudication Assessment [Left:None] Pulses: Dorsalis Pedis Palpable: [Left:Yes] Posterior Tibial Extremity colors, hair growth, and conditions: Extremity Color: [Left:Red] Hair Growth on Extremity: [Left:Yes] Temperature of Extremity: [Left:Warm] Capillary Refill: [Left:< 3 seconds] Toe Nail Assessment Left: Right: Thick: Yes Discolored: Yes Deformed: Yes Improper Length and Hygiene: Yes Electronic Signature(s) Signed: 03/27/2017 11:38:11 AM By: Gretta Cool, BSN, RN, CWS, Kim RN, BSN Entered By: Gretta Cool, BSN, RN, CWS, Kim on 03/27/2017  09:50:29 [name] ([id number]) -------------------------------------------------------------------------------- Multi Wound Chart Details Patient Name: [name] Date of Service: 03/27/2017 9:30 AM Medical Record Number: [id number] Patient Account Number: [id number] Date of Birth/Sex: April 21, 1930 (84 y.o. Male) Treating RN: Roger Shelter Primary Care Acy Orsak: Gaynelle Arabian Other Clinician: Referring Celeste Candelas: Gaynelle Arabian Treating Mahalie Kanner/Extender: Melburn Hake, HOYT Weeks in Treatment: 13 Vital Signs Height(in): 72 Pulse(bpm): 97 Weight(lbs): 217 Blood Pressure(mmHg): 135/62 Body Mass Index(BMI): 29 Temperature(F):  97.9 Respiratory Rate 18 (breaths/min): Photos: [1:No Photos] [N/A:N/A] Wound Location: [1:Left Lower Leg - Midline, Posterior] [N/A:N/A] Wounding Event: [1:Gradually Appeared] [N/A:N/A] Primary Etiology: [1:Venous Leg Ulcer] [N/A:N/A] Comorbid History: [1:Arrhythmia] [N/A:N/A] Date Acquired: [1:12/01/2016] [N/A:N/A] Weeks of Treatment: [1:13] [N/A:N/A] Wound Status: [1:Open] [N/A:N/A] Measurements L x W x D [1:1.5x2.3x0.2] [N/A:N/A] (cm) Area (cm) : [1:2.71] [N/A:N/A] Volume (cm) : [1:0.542] [N/A:N/A] % Reduction in Area: [1:-130.10%] [N/A:N/A] % Reduction in Volume: [1:-359.30%] [N/A:N/A] Classification: [1:Full Thickness Without Exposed Support Structures] [N/A:N/A] Exudate Amount: [1:Large] [N/A:N/A] Exudate Type: [1:Serous] [N/A:N/A] Exudate Color: [1:amber] [N/A:N/A] Wound Margin: [1:Distinct, outline attached] [N/A:N/A] Granulation Amount: [1:Medium (34-66%)] [N/A:N/A] Granulation Quality: [1:Red, Hyper-granulation] [N/A:N/A] Necrotic Amount: [1:Medium (34-66%)] [N/A:N/A] Exposed Structures: [1:Fat Layer (Subcutaneous Tissue) Exposed: Yes] [N/A:N/A] Epithelialization: [1:None] [N/A:N/A] Periwound Skin Texture: [1:Induration: Yes Scarring: Yes Excoriation: No Callus: No Crepitus: No Rash: No] [N/A:N/A] Periwound Skin  Moisture: [1:Maceration: Yes Dry/Scaly: No] [N/A:N/A] Periwound Skin Color: [1:Ecchymosis: Yes Atrophie Blanche: No] [N/A:N/A] Cyanosis: No Erythema: No Hemosiderin Staining: No Mottled: No Pallor: No Rubor: No Temperature: No Abnormality N/A N/A Tenderness on Palpation: Yes N/A N/A Wound Preparation: Ulcer Cleansing: N/A N/A Rinsed/Irrigated with Saline Topical Anesthetic Applied: Other: lidocaine 4% Treatment Notes Electronic Signature(s) Signed: 03/27/2017 4:59:35 PM By: Roger Shelter Entered By: Roger Shelter on 03/27/2017 10:08:43 [name] ([id number]) -------------------------------------------------------------------------------- Multi-Disciplinary Care Plan Details Patient Name: [name] Date of Service: 03/27/2017 9:30 AM Medical Record Number: [id number] Patient Account Number: [id number] Date of Birth/Sex: 01/31/1930 (84 y.o. Male) Treating RN: Roger Shelter Primary Care Shaunice Levitan: Gaynelle Arabian Other Clinician: Referring Perrie Ragin: Gaynelle Arabian Treating Sherrilynn Gudgel/Extender: Melburn Hake, HOYT Weeks in Treatment: 13 Active Inactive ` Orientation to the Wound Care Program Nursing Diagnoses: Knowledge deficit related to the wound healing center program Goals: Patient/caregiver will verbalize understanding of the [facility] Date Initiated: 12/22/2016 Target Resolution Date: 01/12/2017 Goal Status: Active Interventions: Provide education on orientation to the wound center Notes: ` Wound/Skin Impairment Nursing Diagnoses: Impaired tissue integrity Knowledge deficit related to ulceration/compromised skin integrity Goals: Patient/caregiver will verbalize understanding of skin care regimen Date Initiated: 12/22/2016 Target Resolution Date: 01/12/2017 Goal Status: Active Ulcer/skin breakdown will have a volume reduction of 30% by week 4 Date Initiated: 12/22/2016 Target Resolution Date: 01/12/2017 Goal Status:  Active Interventions: Assess patient/caregiver ability to obtain necessary supplies Assess ulceration(s) every visit Treatment Activities: Skin care regimen initiated : 12/22/2016 Notes: Electronic Signature(s) Signed: 03/27/2017 4:59:35 PM By: Darin Engels ([id number]) Entered By: Roger Shelter on 03/27/2017 10:08:34 [name] ([id number]) -------------------------------------------------------------------------------- Pain Assessment Details Patient Name: [name] Date of Service: 03/27/2017 9:30 AM Medical Record Number: [id number] Patient Account Number: [id number] Date of Birth/Sex: 1930-09-16 (84 y.o. Male) Treating RN: Cornell Barman Primary Care Jazarah Capili: Gaynelle Arabian Other Clinician: Referring Amelianna Meller: Gaynelle Arabian Treating Kaelani Kendrick/Extender: Melburn Hake, HOYT Weeks in Treatment: 13 Active Problems Location of Pain Severity and Description of Pain Patient Has Paino No Site Locations With Dressing Change: No Pain Management and Medication Current Pain Management: Goals for Pain Management Topical or injectable lidocaine is offered to patient for acute pain when surgical debridement is performed. If needed, Patient is instructed to use over the counter pain medication for the following 24-48 hours after debridement. Wound care MDs do not prescribed pain medications. Patient has chronic pain or uncontrolled pain. Patient has been instructed to make an appointment with their Primary Care Physician for pain management. Electronic Signature(s) Signed: 03/27/2017 11:38:11 AM By: Gretta Cool, BSN, RN, CWS, Kim RN, BSN Entered By:  Gretta Cool, BSN, RN, CWS, Kim on 03/27/2017 09:44:26 [name] ([id number]) -------------------------------------------------------------------------------- Patient/Caregiver Education Details Patient Name: [name], [name] Date of Service: 03/27/2017 9:30 AM Medical Record Number: [id number] Patient Account Number:  [id number] Date of Birth/Gender: 11-23-1930 (84 y.o. Male) Treating RN: Montey Hora Primary Care Physician: Gaynelle Arabian Other Clinician: Referring Physician: Gaynelle Arabian Treating Physician/Extender: Sharalyn Ink in Treatment: 13 Education Assessment Education Provided To: Patient and Caregiver Education Topics Provided Wound/Skin Impairment: Handouts: Other: wound care and compression Methods: Demonstration, Explain/Verbal Responses: State content correctly Electronic Signature(s) Signed: 03/27/2017 4:59:43 PM By: Montey Hora Entered By: Montey Hora on 03/27/2017 10:29:20 [name] ([id number]) -------------------------------------------------------------------------------- Wound Assessment Details Patient Name: [name] Date of Service: 03/27/2017 9:30 AM Medical Record Number: [id number] Patient Account Number: [id number] Date of Birth/Sex: [birth year] (84 y.o. Male) Treating RN: Cornell Barman Primary Care Adeli Frost: Gaynelle Arabian Other Clinician: Referring Jaaron Oleson: Gaynelle Arabian Treating Ronald Londo/Extender: Melburn Hake, HOYT Weeks in Treatment: 13 Wound Status Wound Number: 1 Primary Etiology: Venous Leg Ulcer Wound Location: Left Lower Leg - Midline, Posterior Wound Status: Open Wounding Event: Gradually Appeared Comorbid History: Arrhythmia Date Acquired: 12/01/2016 Weeks Of Treatment: 13 Clustered Wound: No Photos Photo Uploaded By: Gretta Cool, BSN, RN, CWS, Kim on 03/27/2017 12:59:38 Wound Measurements Length: (cm) 1.5 Width: (cm) 2.3 Depth: (cm) 0.2 Area: (cm) 2.71 Volume: (cm) 0.542 % Reduction in Area: -130.1% % Reduction in Volume: -359.3% Epithelialization: None Tunneling: No Undermining: No Wound Description Full Thickness Without Exposed Support Classification: Structures Wound Margin: Distinct, outline attached Exudate Large Amount: Exudate Type: Serous Exudate Color: amber Foul Odor After Cleansing:  No Slough/Fibrino Yes Wound Bed Granulation Amount: Medium (34-66%) Exposed Structure Granulation Quality: Red, Hyper-granulation Fat Layer (Subcutaneous Tissue) Exposed: Yes Necrotic Amount: Medium (34-66%) Necrotic Quality: Adherent Slough Periwound Skin Texture Texture Color No Abnormalities Noted: No No Abnormalities Noted: No Callus: No Atrophie Blanche: No Crepitus: No Cyanosis: No [name], [name] ([id number]) Excoriation: No Ecchymosis: Yes Induration: Yes Erythema: No Rash: No Hemosiderin Staining: No Scarring: Yes Mottled: No Pallor: No Moisture Rubor: No No Abnormalities Noted: No Dry / Scaly: No Temperature / Pain Maceration: Yes Temperature: No Abnormality Tenderness on Palpation: Yes Wound Preparation Ulcer Cleansing: Rinsed/Irrigated with Saline Topical Anesthetic Applied: Other: lidocaine 4%, Treatment Notes Wound #1 (Left, Midline, Posterior Lower Leg) 1. Cleansed with: Clean wound with Normal Saline 2. Anesthetic Topical Lidocaine 4% cream to wound bed prior to debridement 4. Dressing Applied: Prisma Ag Other dressing (specify in notes) 5. Secondary Dressing Applied Bordered Foam Dressing Notes drawtex under BFD Electronic Signature(s) Signed: 03/27/2017 11:38:11 AM By: Gretta Cool, BSN, RN, CWS, Kim RN, BSN Entered By: Gretta Cool, BSN, RN, CWS, Kim on 03/27/2017 09:48:05 [name] ([id number]) -------------------------------------------------------------------------------- Vitals Details Patient Name: [name] Date of Service: 03/27/2017 9:30 AM Medical Record Number: [id number] Patient Account Number: [id number] Date of Birth/Sex: 1930/07/02 (84 y.o. Male) Treating RN: Cornell Barman Primary Care Jordy Verba: Gaynelle Arabian Other Clinician: Referring Euan Wandler: Gaynelle Arabian Treating Annelyse Rey/Extender: Melburn Hake, HOYT Weeks in Treatment: 13 Vital Signs Time Taken: 09:44 Temperature (F): 97.9 Height (in): 72 Pulse (bpm): 97 Weight  (lbs): 217 Respiratory Rate (breaths/min): 18 Body Mass Index (BMI): 29.4 Blood Pressure (mmHg): 135/62 Reference Range: 80 - 120 mg / dl Pulse Oximetry (%): 98 Electronic Signature(s) Signed: 03/27/2017 11:38:11 AM By: Gretta Cool, BSN, RN, CWS, Kim RN, BSN Entered By: Gretta Cool, BSN, RN, CWS, Kim on 03/27/2017 09:45:00

## 2017-04-03 ENCOUNTER — Other Ambulatory Visit: Payer: Self-pay | Admitting: Interventional Cardiology

## 2017-04-10 ENCOUNTER — Encounter: Payer: Medicare Other | Attending: Physician Assistant | Admitting: Physician Assistant

## 2017-04-10 DIAGNOSIS — E11622 Type 2 diabetes mellitus with other skin ulcer: Secondary | ICD-10-CM | POA: Diagnosis present

## 2017-04-10 DIAGNOSIS — I480 Paroxysmal atrial fibrillation: Secondary | ICD-10-CM | POA: Insufficient documentation

## 2017-04-10 DIAGNOSIS — L97222 Non-pressure chronic ulcer of left calf with fat layer exposed: Secondary | ICD-10-CM | POA: Insufficient documentation

## 2017-04-10 DIAGNOSIS — Z87891 Personal history of nicotine dependence: Secondary | ICD-10-CM | POA: Insufficient documentation

## 2017-04-10 DIAGNOSIS — I10 Essential (primary) hypertension: Secondary | ICD-10-CM | POA: Insufficient documentation

## 2017-04-11 NOTE — Progress Notes (Signed)
[name] ([id number]) Visit Report for 04/10/2017 Arrival Information Details Patient Name: [name], [name]. Date of Service: 04/10/2017 9:30 AM Medical Record Number: [id number] Patient Account Number: [id number] Date of Birth/Sex: [birth year] (84 y.o. M) Treating RN: [name] Primary Care [name]: [name] Other Clinician: Referring [name]: [name] Treating [name]/Extender: [name] [name]: 15 Visit Information History Since Last Visit Added or deleted any medications: No Patient Arrived: Ambulatory Any new allergies or adverse reactions: No Arrival Time: 10:00 Had a fall or experienced change in No Accompanied By: wife activities of daily living that may affect Transfer Assistance: None risk of falls: Patient Identification Verified: Yes Signs or symptoms of abuse/neglect since last visito No Secondary Verification Process Completed: Yes Hospitalized since last visit: No Patient Requires Transmission-Based No Implantable device outside of the clinic excluding No Precautions: cellular tissue based products placed in the center Patient Has Alerts: No since last visit: Has Dressing in Place as Prescribed: Yes Pain Present Now: No Electronic Signature(s) Signed: 04/11/2017 9:41:04 AM By: [name], BSN, RN, CWS, Kim RN, BSN Entered By: [name], BSN, RN, CWS, Kim on 04/10/2017 10:00:45 [name] ([id number]) -------------------------------------------------------------------------------- Clinic Level of Care Assessment Details Patient Name: [name]. Date of Service: 04/10/2017 9:30 AM Medical Record Number: [id number] Patient Account Number: [id number] Date of Birth/Sex: [birth year] (84 y.o. M) Treating RN: [name] Primary Care Anarely Nicholls: [name] Other Clinician: Referring Margeaux Swantek: [name] Treating [name]/Extender: [name] [name]: 15 Clinic Level of Care Assessment Items TOOL 4  Quantity Score []  - Use when only an EandM is performed on FOLLOW-UP visit 0 ASSESSMENTS - Nursing Assessment / Reassessment X - Reassessment of Co-morbidities (includes updates in patient status) 1 10 X- 1 5 Reassessment of Adherence to Mcdonald Plan ASSESSMENTS - Wound and Skin Assessment / Reassessment X - Simple Wound Assessment / Reassessment - one wound 1 5 []  - 0 Complex Wound Assessment / Reassessment - multiple wounds []  - 0 Dermatologic / Skin Assessment (not related to wound area) ASSESSMENTS - Focused Assessment []  - Circumferential Edema Measurements - multi extremities 0 []  - 0 Nutritional Assessment / Counseling / Intervention []  - 0 Lower Extremity Assessment (monofilament, tuning fork, pulses) []  - 0 Peripheral Arterial Disease Assessment (using hand held doppler) ASSESSMENTS - Ostomy and/or Continence Assessment and Care []  - Incontinence Assessment and Management 0 []  - 0 Ostomy Care Assessment and Management (repouching, etc.) PROCESS - Coordination of Care X - Simple Patient / Family Education for ongoing care 1 15 []  - 0 Complex (extensive) Patient / Family Education for ongoing care []  - 0 Staff obtains Programmer, systems, Records, Test Results / Process Orders []  - 0 Staff telephones HHA, Nursing Homes / Clarify orders / etc []  - 0 Routine Transfer to another Facility (non-emergent condition) []  - 0 Routine Hospital Admission (non-emergent condition) []  - 0 New Admissions / Biomedical engineer / Ordering NPWT, Apligraf, etc. []  - 0 Emergency Hospital Admission (emergent condition) X- 1 10 Simple Discharge Coordination [name], [name] ([id number]) []  - 0 Complex (extensive) Discharge Coordination PROCESS - Special Needs []  - Pediatric / Minor Patient Management 0 []  - 0 Isolation Patient Management []  - 0 Hearing / Language / Visual special needs []  - 0 Assessment of Community assistance (transportation, D/C planning, etc.) []  - 0 Additional  assistance / Altered mentation []  - 0 Support Surface(s) Assessment (bed, cushion, seat, etc.) INTERVENTIONS - Wound Cleansing / Measurement X - Simple Wound Cleansing - one  wound 1 5 []  - 0 Complex Wound Cleansing - multiple wounds X- 1 5 Wound Imaging (photographs - any number of wounds) []  - 0 Wound Tracing (instead of photographs) X- 1 5 Simple Wound Measurement - one wound []  - 0 Complex Wound Measurement - multiple wounds INTERVENTIONS - Wound Dressings X - Small Wound Dressing one or multiple wounds 1 10 []  - 0 Medium Wound Dressing one or multiple wounds []  - 0 Large Wound Dressing one or multiple wounds []  - 0 Application of Medications - topical []  - 0 Application of Medications - injection INTERVENTIONS - Miscellaneous []  - External ear exam 0 []  - 0 Specimen Collection (cultures, biopsies, blood, body fluids, etc.) []  - 0 Specimen(s) / Culture(s) sent or taken to Lab for analysis []  - 0 Patient Transfer (multiple staff / Civil Service fast streamer / Similar devices) []  - 0 Simple Staple / Suture removal (25 or less) []  - 0 Complex Staple / Suture removal (26 or more) []  - 0 Hypo / Hyperglycemic Management (close monitor of Blood Glucose) []  - 0 Ankle / Brachial Index (ABI) - do not check if billed separately X- 1 5 Vital Signs [name], Danny J. ([id number]) Has the patient been seen at the hospital within the last three years: Yes Total Score: 75 Level Of Care: New/Established - Level 2 Electronic Signature(s) Signed: 04/10/2017 5:00:26 PM By: [name] Entered By: [name] on 04/10/2017 10:25:30 [name] ([id number]) -------------------------------------------------------------------------------- Encounter Discharge Information Details Patient Name: [name] Date of Service: 04/10/2017 9:30 AM Medical Record Number: [id number] Patient Account Number: [id number] Date of Birth/Sex: 09/08/1930 (84 y.o. M) Treating RN: [name] Primary  Care Lawayne Hartig: [name] Other Clinician: Referring Barclay Lennox: [name] Treating Claude Swendsen/Extender: [name] [name]: 15 Encounter Discharge Information Items Discharge Pain Level: 0 Discharge Condition: Stable Ambulatory Status: Ambulatory Discharge Destination: Home Transportation: Private Auto Accompanied By: spouse Schedule Follow-up Appointment: Yes Medication Reconciliation completed and No provided to Patient/Care Ahmaad Neidhardt: Provided on Clinical Summary of Care: 04/10/2017 Form Type Recipient Paper Patient AN Electronic Signature(s) Signed: 04/10/2017 10:50:19 AM By: Montey Hora Entered By: Montey Hora on 04/10/2017 10:50:18 [name] ([id number]) -------------------------------------------------------------------------------- Lower Extremity Assessment Details Patient Name: [name] Date of Service: 04/10/2017 9:30 AM Medical Record Number: [id number] Patient Account Number: [id number] Date of Birth/Sex: 1930/10/30 (84 y.o. M) Treating RN: [name] Primary Care Sahirah Rudell: [name] Other Clinician: Referring Arran Fessel: [name] Treating Malakhai Beitler/Extender: [name] [name]: 15 Edema Assessment Assessed: [Left: No] [Right: No] [Left: Edema] [Right: :] Calf Left: Right: Point of Measurement: 34 cm From Medial Instep 36.4 cm cm Ankle Left: Right: Point of Measurement: 13 cm From Medial Instep 24.6 cm cm Vascular Assessment Claudication: Claudication Assessment [Left:None] Pulses: Dorsalis Pedis Doppler Audible: [Left:Yes] Posterior Tibial Extremity colors, hair growth, and conditions: Extremity Color: [Left:Hyperpigmented] Hair Growth on Extremity: [Left:Yes] Temperature of Extremity: [Left:Warm] Capillary Refill: [Left:< 3 seconds] Toe Nail Assessment Left: Right: Thick: Yes Discolored: Yes Deformed: Yes Improper Length and Hygiene: No Electronic Signature(s) Signed: 04/11/2017  9:41:04 AM By: [name], BSN, RN, CWS, Kim RN, BSN Entered By: [name], BSN, RN, CWS, Kim on 04/10/2017 10:13:46 [name] ([id number]) -------------------------------------------------------------------------------- Multi Wound Chart Details Patient Name: [name] Date of Service: 04/10/2017 9:30 AM Medical Record Number: [id number] Patient Account Number: [id number] Date of Birth/Sex: Mar 02, 1930 (84 y.o. M) Treating RN: [name] Primary Care Jenniferlynn Saad: [name] Other Clinician: Referring Wadell Craddock: [name] Treating Tawfiq Favila/Extender: [name]  [name]: 15 Vital Signs Height(in): 72 Pulse(bpm): 92 Weight(lbs): 217 Blood Pressure(mmHg): 137/79 Body Mass Index(BMI): 29 Temperature(F): 97.7 Respiratory Rate 18 (breaths/min): Photos: [1:No Photos] [4:No Photos] [N/A:N/A] Wound Location: [1:Left Lower Leg - Midline, Posterior] [4:Left Lower Leg - Medial] [N/A:N/A] Wounding Event: [1:Gradually Appeared] [4:Trauma] [N/A:N/A] Primary Etiology: [1:Venous Leg Ulcer] [4:Trauma, Other] [N/A:N/A] Comorbid History: [1:Arrhythmia] [4:Arrhythmia] [N/A:N/A] Date Acquired: [1:12/01/2016] [4:03/09/2017] [N/A:N/A] Danny of Mcdonald: [1:15] [4:0] [N/A:N/A] Wound Status: [1:Open] [4:Open] [N/A:N/A] Measurements L x W x D [1:1x2x0.2] [4:0.7x1.2x0.1] [N/A:N/A] (cm) Area (cm) : [1:1.571] [4:0.66] [N/A:N/A] Volume (cm) : [1:0.314] [4:0.066] [N/A:N/A] % Reduction in Area: [1:-33.40%] [4:0.00%] [N/A:N/A] % Reduction in Volume: [1:-166.10%] [4:0.00%] [N/A:N/A] Classification: [1:Full Thickness Without Exposed Support Structures] [4:Full Thickness Without Exposed Support Structures] [N/A:N/A] Exudate Amount: [1:Medium] [4:Medium] [N/A:N/A] Exudate Type: [1:Serous] [4:Serosanguineous] [N/A:N/A] Exudate Color: [1:amber] [4:red, brown] [N/A:N/A] Wound Margin: [1:Distinct, outline attached] [4:Flat and Intact] [N/A:N/A] Granulation Amount: [1:Large (67-100%)]  [4:None Present (0%)] [N/A:N/A] Granulation Quality: [1:Red, Hyper-granulation] [4:N/A] [N/A:N/A] Necrotic Amount: [1:Small (1-33%)] [4:Large (67-100%)] [N/A:N/A] Necrotic Tissue: [1:Adherent Slough] [4:Eschar] [N/A:N/A] Exposed Structures: [1:Fat Layer (Subcutaneous Tissue) Exposed: Yes] [4:N/A] [N/A:N/A] Epithelialization: [1:None] [4:None] [N/A:N/A] Periwound Skin Texture: [1:Induration: Yes Scarring: Yes Excoriation: No Callus: No Crepitus: No Rash: No] [4:No Abnormalities Noted] [N/A:N/A] Periwound Skin Moisture: [1:Maceration: No Dry/Scaly: No] [4:No Abnormalities Noted] [N/A:N/A] Periwound Skin Color: [4:No Abnormalities Noted] [N/A:N/A] Atrophie Blanche: No Cyanosis: No Ecchymosis: No Erythema: No Hemosiderin Staining: No Mottled: No Pallor: No Rubor: No Temperature: No Abnormality N/A N/A Tenderness on Palpation: Yes No N/A Wound Preparation: Ulcer Cleansing: Ulcer Cleansing: N/A Rinsed/Irrigated with Saline Rinsed/Irrigated with Saline Topical Anesthetic Applied: Topical Anesthetic Applied: Other: lidocaine 4% Other: lidocaine 4% Mcdonald Notes Electronic Signature(s) Signed: 04/10/2017 5:00:26 PM By: [name] Entered By: [name] on 04/10/2017 10:19:27 [name] ([id number]) -------------------------------------------------------------------------------- Multi-Disciplinary Care Plan Details Patient Name: [name] Date of Service: 04/10/2017 9:30 AM Medical Record Number: [id number] Patient Account Number: [id number] Date of Birth/Sex: 06/05/1930 (84 y.o. M) Treating RN: [name] Primary Care Blaise Palladino: [name] Other Clinician: Referring Xoe Hoe: [name] Treating Nylan Nakatani/Extender: [name] [name]: 15 Active Inactive ` Orientation to the Wound Care Program Nursing Diagnoses: Knowledge deficit related to the wound healing center program Goals: Patient/caregiver will verbalize understanding of  the [facility] Date Initiated: 12/22/2016 Target Resolution Date: 01/12/2017 Goal Status: Active Interventions: Provide education on orientation to the wound center Notes: ` Wound/Skin Impairment Nursing Diagnoses: Impaired tissue integrity Knowledge deficit related to ulceration/compromised skin integrity Goals: Patient/caregiver will verbalize understanding of skin care regimen Date Initiated: 12/22/2016 Target Resolution Date: 01/12/2017 Goal Status: Active Ulcer/skin breakdown will have a volume reduction of 30% by week 4 Date Initiated: 12/22/2016 Target Resolution Date: 01/12/2017 Goal Status: Active Interventions: Assess patient/caregiver ability to obtain necessary supplies Assess ulceration(s) every visit Mcdonald Activities: Skin care regimen initiated : 12/22/2016 Notes: Electronic Signature(s) Signed: 04/10/2017 5:00:26 PM By: Darin Engels ([id number]) Entered By: [name] on 04/10/2017 10:19:05 [name] ([id number]) -------------------------------------------------------------------------------- Pain Assessment Details Patient Name: [name] Date of Service: 04/10/2017 9:30 AM Medical Record Number: [id number] Patient Account Number: [id number] Date of Birth/Sex: 21-Dec-1930 (84 y.o. M) Treating RN: [name] Primary Care Dejuana Weist: [name] Other Clinician: Referring Aidynn Krenn: [name] Treating Patrici Minnis/Extender: [name] [name]: 15 Active Problems Location of Pain Severity and Description of Pain Patient Has Paino No Site Locations With Dressing Change: No Pain Management and Medication Current Pain Management: Electronic  Signature(s) Signed: 04/11/2017 9:41:04 AM By: [name], BSN, RN, CWS, Kim RN, BSN Entered By: [name], BSN, RN, CWS, Kim on 04/10/2017 10:00:59 [name]  ([id number]) -------------------------------------------------------------------------------- Patient/Caregiver Education Details Patient Name: [name], [name] Date of Service: 04/10/2017 9:30 AM Medical Record Number: [id number] Patient Account Number: [id number] Date of Birth/Gender: 06-07-1930 (84 y.o. M) Treating RN: Montey Hora Primary Care Physician: [name] Other Clinician: Referring Physician: Gaynelle Mcdonald Treating Physician/Extender: Sharalyn Ink in Mcdonald: 15 Education Assessment Education Provided To: Patient and Caregiver Education Topics Provided Wound/Skin Impairment: Handouts: Other: wound care as ordered Methods: Demonstration, Explain/Verbal Responses: State content correctly Electronic Signature(s) Signed: 04/10/2017 5:08:18 PM By: Montey Hora Entered By: Montey Hora on 04/10/2017 10:50:40 [name] ([id number]) -------------------------------------------------------------------------------- Wound Assessment Details Patient Name: [name] Date of Service: 04/10/2017 9:30 AM Medical Record Number: [id number] Patient Account Number: [id number] Date of Birth/Sex: 1930-06-15 (84 y.o. M) Treating RN: [name] Primary Care Adalae Baysinger: [name] Other Clinician: Referring Dorri Ozturk: [name] Treating Aland Chestnutt/Extender: [name] [name]: 15 Wound Status Wound Number: 1 Primary Etiology: Venous Leg Ulcer Wound Location: Left Lower Leg - Midline, Posterior Wound Status: Open Wounding Event: Gradually Appeared Comorbid History: Arrhythmia Date Acquired: 12/01/2016 Danny Of Mcdonald: 15 Clustered Wound: No Wound Measurements Length: (cm) 1 Width: (cm) 2 Depth: (cm) 0.2 Area: (cm) 1.571 Volume: (cm) 0.314 % Reduction in Area: -33.4% % Reduction in Volume: -166.1% Epithelialization: None Tunneling: No Undermining: No Wound Description Full Thickness Without Exposed  Support Classification: Structures Wound Margin: Distinct, outline attached Exudate Medium Amount: Exudate Type: Serous Exudate Color: amber Foul Odor After Cleansing: No Slough/Fibrino No Wound Bed Granulation Amount: Large (67-100%) Exposed Structure Granulation Quality: Red, Hyper-granulation Fat Layer (Subcutaneous Tissue) Exposed: Yes Necrotic Amount: Small (1-33%) Necrotic Quality: Adherent Slough Periwound Skin Texture Texture Color No Abnormalities Noted: No No Abnormalities Noted: No Callus: No Atrophie Blanche: No Crepitus: No Cyanosis: No Excoriation: No Ecchymosis: No Induration: Yes Erythema: No Rash: No Hemosiderin Staining: No Scarring: Yes Mottled: No Pallor: No Moisture Rubor: No No Abnormalities Noted: No Dry / Scaly: No Temperature / Pain Maceration: No Temperature: No Abnormality Tenderness on Palpation: Yes [name], [name] ([id number]) Wound Preparation Ulcer Cleansing: Rinsed/Irrigated with Saline Topical Anesthetic Applied: Other: lidocaine 4%, Mcdonald Notes Wound #1 (Left, Midline, Posterior Lower Leg) 1. Cleansed with: Clean wound with Normal Saline 2. Anesthetic Topical Lidocaine 4% cream to wound bed prior to debridement 4. Dressing Applied: Prisma Ag Other dressing (specify in notes) 5. Secondary Dressing Applied ABD Pad Kerlix/Conform 7. Secured with Tape Notes ace wrap Electronic Signature(s) Signed: 04/11/2017 9:41:04 AM By: [name], BSN, RN, CWS, Kim RN, BSN Entered By: [name], BSN, RN, CWS, Kim on 04/10/2017 10:07:36 [name] ([id number]) -------------------------------------------------------------------------------- Wound Assessment Details Patient Name: [name], DOWNUM. Date of Service: 04/10/2017 9:30 AM Medical Record Number: [id number] Patient Account Number: [id number] Date of Birth/Sex: [birth year] (84 y.o. M) Treating RN: [name] Primary Care Chimere Klingensmith: [name] Other Clinician: Referring  Ercilia Bettinger: [name] Treating Glynn Freas/Extender: [name] [name]: 15 Wound Status Wound Number: 4 Primary Etiology: Trauma, Other Wound Location: Left Lower Leg - Medial Wound Status: Open Wounding Event: Trauma Comorbid History: Arrhythmia Date Acquired: 03/09/2017 Danny Of Mcdonald: 0 Clustered Wound: No Wound Measurements Length: (cm) 0.7 Width: (cm) 1.2 Depth: (cm) 0.1 Area: (cm) 0.66 Volume: (cm) 0.066 % Reduction in Area: 0% % Reduction in Volume: 0% Epithelialization: None Wound Description Full Thickness Without Exposed Support Classification: Structures  Wound Margin: Flat and Intact Exudate Medium Amount: Exudate Type: Serosanguineous Exudate Color: red, brown Foul Odor After Cleansing: No Slough/Fibrino No Wound Bed Granulation Amount: None Present (0%) Necrotic Amount: Large (67-100%) Necrotic Quality: Eschar Periwound Skin Texture Texture Color No Abnormalities Noted: No No Abnormalities Noted: No Moisture No Abnormalities Noted: No Wound Preparation Ulcer Cleansing: Rinsed/Irrigated with Saline Topical Anesthetic Applied: Other: lidocaine 4%, Mcdonald Notes Wound #4 (Left, Medial Lower Leg) 1. Cleansed with: Clean wound with Normal Saline 2. Anesthetic Topical Lidocaine 4% cream to wound bed prior to debridement [name], FYE. ([id number]) 4. Dressing Applied: Prisma Ag Other dressing (specify in notes) 5. Secondary Dressing Applied ABD Pad Kerlix/Conform 7. Secured with Tape Notes ace wrap Electronic Signature(s) Signed: 04/11/2017 9:41:04 AM By: [name], BSN, RN, CWS, Kim RN, BSN Entered By: [name], BSN, RN, CWS, Kim on 04/10/2017 10:11:27 [name] ([id number]) -------------------------------------------------------------------------------- Vitals Details Patient Name: [name] Date of Service: 04/10/2017 9:30 AM Medical Record Number: [id number] Patient Account Number: [id number] Date of  Birth/Sex: January 07, 1931 (84 y.o. M) Treating RN: [name] Primary Care Charrie Mcconnon: [name] Other Clinician: Referring Jaysten Essner: [name] Treating Momoka Stringfield/Extender: [name] [name]: 15 Vital Signs Time Taken: 10:01 Temperature (F): 97.7 Height (in): 72 Pulse (bpm): 92 Weight (lbs): 217 Respiratory Rate (breaths/min): 18 Body Mass Index (BMI): 29.4 Blood Pressure (mmHg): 137/79 Reference Range: 80 - 120 mg / dl Electronic Signature(s) Signed: 04/11/2017 9:41:04 AM By: [name], BSN, RN, CWS, Kim RN, BSN Entered By: [name], BSN, RN, CWS, Kim on 04/10/2017 10:01:15

## 2017-04-11 NOTE — Progress Notes (Signed)
[name], [name] ([id number]) Visit Report for 04/10/2017 Chief Complaint Document Details Patient Name: [name], [name]. Date of Service: 04/10/2017 9:30 AM Medical Record Number: [id number] Patient Account Number: [id number] Date of Birth/Sex: Dec 18, 1930 (84 y.o. M) Treating RN: Roger Shelter Primary Care Provider: Gaynelle Arabian Other Clinician: Referring Provider: Gaynelle Arabian Treating Provider/Extender: Melburn Hake, HOYT Weeks in Treatment: 15 Information Obtained from: Patient Chief Complaint He is seen for follow up evaluation of lle ulcers Electronic Signature(s) Signed: 04/11/2017 8:40:52 AM By: Worthy Keeler PA-C Entered By: Worthy Keeler on 04/10/2017 09:53:09 [name] ([id number]) -------------------------------------------------------------------------------- HPI Details Patient Name: [name] Date of Service: 04/10/2017 9:30 AM Medical Record Number: [id number] Patient Account Number: [id number] Date of Birth/Sex: 1930-04-15 (84 y.o. M) Treating RN: Roger Shelter Primary Care Provider: Gaynelle Arabian Other Clinician: Referring Provider: Gaynelle Arabian Treating Provider/Extender: Melburn Hake, HOYT Weeks in Treatment: 15 History of Present Illness Location: left lower extremity swelling with ulceration on the posterior calf Modifying Factors: Other treatment(s) tried include:and seen his PCP who thought there was cellulitis and infection and put him on doxycycline for 2 weeks HPI Description: 84 year old gentleman has been referred to [place] by his PCP Dr. Gaynelle Arabian, for a ulcer on the left posterior lower leg which has been there for about 6 weeks. The patient was thought to have an infection and was put on doxycycline for 14 days and referred to the wound center. He has a past medical history of paroxysmal atrial fibrillation, hypertension, diverticulosis, status post right hip replacement, appendectomy. He has not been a smoker since 23. the anterior  shin wound has been more recent about 2 weeks and this was caused probably due to a blunt abrasion. 03/06/17 on evaluation today patient's ulcer appears to show signs of excellent improvement. He is definitely showing additional evidence of granulation which is great news. Overall I'm extremely pleased with the progress that he has been making from the beginning to where we are right now. With that being said I would like to potentially try different dressing to see if we could stimulate this to heal even faster. 03/13/17 on evaluation today patient appears to be doing little better in regard to his leg ulcer at this time. He has been tolerating the dressing changes without complication. There does not appear to be any evidence of infection which is good news. He does have some additional granulation observed at this point and I do believe that the silver collagen dressing has been of benefit in this regard. Overall I'm very pleased with the progress she's made since last visit the only downside is that he does have additional maceration due to drainage compared to previous we may need to do something to address this. 03/27/17 on evaluation today patient appears to be doing well in regard to his left posterior lower surety ulcer. He has a significant area of epithelial growth which seems to be doing excellent at this point in time. He also seems to be well controlled as far as slough is concerned and drainage there is no evidence of maceration and there really was no slough that could not be cleaned off with saline and gauze. Overall patient seems to be making excellent progress. 04/10/17 on evaluation today patient appears to be doing better in regard to the original ulcer for which we have been treating him. Unfortunately he has two areas where he sustained injury to his posterior leg more proximal to where the current ulceration is. This was due  to it appears to be tape and adhesive at this point. When  he was using the Allevyn dressing there was no problems however they ran out of these and since that point there seems to have been more irritation around the wound from the Band-Aids that his wife has been using in their place. Other than this which appear to be very superficial new ulcers everything appears to be progressing nicely. Electronic Signature(s) Signed: 04/11/2017 8:40:52 AM By: Worthy Keeler PA-C Entered By: Worthy Keeler on 04/10/2017 10:33:23 [name] ([id number]) -------------------------------------------------------------------------------- Physical Exam Details Patient Name: [name] Date of Service: 04/10/2017 9:30 AM Medical Record Number: [id number] Patient Account Number: [id number] Date of Birth/Sex: 01-17-1930 (84 y.o. M) Treating RN: Roger Shelter Primary Care Provider: Gaynelle Arabian Other Clinician: Referring Provider: Gaynelle Arabian Treating Provider/Extender: STONE III, HOYT Weeks in Treatment: 49 Constitutional Well-nourished and well-hydrated in no acute distress. Respiratory normal breathing without difficulty. clear to auscultation bilaterally. Cardiovascular regular rate and rhythm with normal S1, S2. 1+ pitting edema of the bilateral lower extremities. Psychiatric this patient is able to make decisions and demonstrates good insight into disease process. Alert and Oriented x 3. pleasant and cooperative. Notes Patient's wound today was cleansed with saline and gauze mechanically there was no evidence of need for sharp debridement today. The two new ulcers also cleaned off very well once the lidocaine had a chance to loosen things up and there appeared to be very superficial wound at two locations noted at this point. I believe the Prisma would work well for this as well. Electronic Signature(s) Signed: 04/11/2017 8:40:52 AM By: Worthy Keeler PA-C Entered By: Worthy Keeler on 04/10/2017 10:34:15 [name]  ([id number]) -------------------------------------------------------------------------------- Physician Orders Details Patient Name: [name] Date of Service: 04/10/2017 9:30 AM Medical Record Number: [id number] Patient Account Number: [id number] Date of Birth/Sex: [birth year] (84 y.o. M) Treating RN: Roger Shelter Primary Care Provider: Gaynelle Arabian Other Clinician: Referring Provider: Gaynelle Arabian Treating Provider/Extender: Melburn Hake, HOYT Weeks in Treatment: 15 Verbal / Phone Orders: No Diagnosis Coding ICD-10 Coding Code Description I89.0 Lymphedema, not elsewhere classified S81.812A Laceration without foreign body, left lower leg, initial encounter L97.222 Non-pressure chronic ulcer of left calf with fat layer exposed L97.221 Non-pressure chronic ulcer of left calf limited to breakdown of skin Wound Cleansing Wound #1 Left,Midline,Posterior Lower Leg o Clean wound with Normal Saline. o Cleanse wound with mild soap and water o May Shower, gently pat wound dry prior to applying new dressing. Anesthetic (add to Medication List) Wound #1 Left,Midline,Posterior Lower Leg o Topical Lidocaine 4% cream applied to wound bed prior to debridement (In Clinic Only). Primary Wound Dressing Wound #1 Left,Midline,Posterior Lower Leg o Silver Collagen Wound #4 Left,Medial Lower Leg o Silver Collagen Secondary Dressing Wound #1 Left,Midline,Posterior Lower Leg o ABD pad - kerlix wrap and ace wrap o Drawtex Wound #4 Left,Medial Lower Leg o ABD pad - kerlix wrap and ace wrap o Drawtex Dressing Change Frequency Wound #1 Left,Midline,Posterior Lower Leg o Change dressing every other day. Follow-up Appointments o Return Appointment in 2 weeks. Edema Control Wound #1 Left,Midline,Posterior Lower Leg YAHYE, SIEBERT ([id number]) o Patient to wear own compression stockings Additional Orders / Instructions Wound #1 Left,Midline,Posterior Lower Leg o  Vitamin A; Vitamin C, Zinc o Increase protein intake. Electronic Signature(s) Signed: 04/10/2017 5:00:26 PM By: Roger Shelter Signed: 04/11/2017 8:40:52 AM By: Worthy Keeler PA-C Entered By: Roger Shelter on 04/10/2017 10:25:00 [name] ([id number]) --------------------------------------------------------------------------------  Problem List Details Patient Name: KRAVEN, CALK. Date of Service: 04/10/2017 9:30 AM Medical Record Number: [id number] Patient Account Number: [id number] Date of Birth/Sex: 1930-09-19 (84 y.o. M) Treating RN: Roger Shelter Primary Care Provider: Gaynelle Arabian Other Clinician: Referring Provider: Gaynelle Arabian Treating Provider/Extender: Melburn Hake, HOYT Weeks in Treatment: 15 Active Problems ICD-10 Impacting Encounter Code Description Active Date Wound Healing Diagnosis I89.0 Lymphedema, not elsewhere classified 12/22/2016 Yes X32.355D Laceration without foreign body, left lower leg, initial 12/22/2016 Yes encounter L97.222 Non-pressure chronic ulcer of left calf with fat layer exposed 12/22/2016 Yes L97.221 Non-pressure chronic ulcer of left calf limited to breakdown of 12/22/2016 Yes skin Inactive Problems Resolved Problems Electronic Signature(s) Signed: 04/11/2017 8:40:52 AM By: Worthy Keeler PA-C Entered By: Worthy Keeler on 04/10/2017 09:53:00 [name] ([id number]) -------------------------------------------------------------------------------- Progress Note Details Patient Name: [name] Date of Service: 04/10/2017 9:30 AM Medical Record Number: [id number] Patient Account Number: [id number] Date of Birth/Sex: 1930/05/25 (84 y.o. M) Treating RN: Roger Shelter Primary Care Provider: Gaynelle Arabian Other Clinician: Referring Provider: Gaynelle Arabian Treating Provider/Extender: Melburn Hake, HOYT Weeks in Treatment: 15 Subjective Chief Complaint Information obtained from Patient He is seen for follow up  evaluation of lle ulcers History of Present Illness (HPI) The following HPI elements were documented for the patient's wound: Location: left lower extremity swelling with ulceration on the posterior calf Modifying Factors: Other treatment(s) tried include:and seen his PCP who thought there was cellulitis and infection and put him on doxycycline for 49 weeks 84 year old gentleman has been referred to [place] by his PCP Dr. Gaynelle Arabian, for a ulcer on the left posterior lower leg which has been there for about 6 weeks. The patient was thought to have an infection and was put on doxycycline for 14 days and referred to the wound center. He has a past medical history of paroxysmal atrial fibrillation, hypertension, diverticulosis, status post right hip replacement, appendectomy. He has not been a smoker since 89. the anterior shin wound has been more recent about 2 weeks and this was caused probably due to a blunt abrasion. 03/06/17 on evaluation today patient's ulcer appears to show signs of excellent improvement. He is definitely showing additional evidence of granulation which is great news. Overall I'm extremely pleased with the progress that he has been making from the beginning to where we are right now. With that being said I would like to potentially try different dressing to see if we could stimulate this to heal even faster. 03/13/17 on evaluation today patient appears to be doing little better in regard to his leg ulcer at this time. He has been tolerating the dressing changes without complication. There does not appear to be any evidence of infection which is good news. He does have some additional granulation observed at this point and I do believe that the silver collagen dressing has been of benefit in this regard. Overall I'm very pleased with the progress she's made since last visit the only downside is that he does have additional maceration due to drainage compared to previous we may  need to do something to address this. 03/27/17 on evaluation today patient appears to be doing well in regard to his left posterior lower surety ulcer. He has a significant area of epithelial growth which seems to be doing excellent at this point in time. He also seems to be well controlled as far as slough is concerned and drainage there is no evidence of maceration and there really was no slough  that could not be cleaned off with saline and gauze. Overall patient seems to be making excellent progress. 04/10/17 on evaluation today patient appears to be doing better in regard to the original ulcer for which we have been treating him. Unfortunately he has two areas where he sustained injury to his posterior leg more proximal to where the current ulceration is. This was due to it appears to be tape and adhesive at this point. When he was using the Allevyn dressing there was no problems however they ran out of these and since that point there seems to have been more irritation around the wound from the Band-Aids that his wife has been using in their place. Other than this which appear to be very superficial new ulcers everything appears to be progressing nicely. Patient History Information obtained from Patient. Family History Diabetes - Child, Hypertension - Child, No family history of Cancer, Heart Disease, Kidney Disease, Lung Disease, Seizures, Stroke, Thyroid Problems, QASIM, DIVELEY. ([id number]) Tuberculosis. Social History Former smoker - 59 years +, Marital Status - Married, Alcohol Use - Never, Drug Use - No History, Caffeine Use - Rarely. Review of Systems (ROS) Constitutional Symptoms (General Health) Denies complaints or symptoms of Fever, Chills. Respiratory The patient has no complaints or symptoms. Cardiovascular Complains or has symptoms of LE edema. Psychiatric The patient has no complaints or symptoms. Objective Constitutional Well-nourished and well-hydrated in no acute  distress. Vitals Time Taken: 10:01 AM, Height: 72 in, Weight: 217 lbs, BMI: 29.4, Temperature: 97.7 F, Pulse: 92 bpm, Respiratory Rate: 18 breaths/min, Blood Pressure: 137/79 mmHg. Respiratory normal breathing without difficulty. clear to auscultation bilaterally. Cardiovascular regular rate and rhythm with normal S1, S2. 1+ pitting edema of the bilateral lower extremities. Psychiatric this patient is able to make decisions and demonstrates good insight into disease process. Alert and Oriented x 3. pleasant and cooperative. General Notes: Patient's wound today was cleansed with saline and gauze mechanically there was no evidence of need for sharp debridement today. The two new ulcers also cleaned off very well once the lidocaine had a chance to loosen things up and there appeared to be very superficial wound at two locations noted at this point. I believe the Prisma would work well for this as well. Integumentary (Hair, Skin) Wound #1 status is Open. Original cause of wound was Gradually Appeared. The wound is located on the Left,Midline,Posterior Lower Leg. The wound measures 1cm length x 2cm width x 0.2cm depth; 1.571cm^2 area and 0.314cm^3 volume. There is Fat Layer (Subcutaneous Tissue) Exposed exposed. There is no tunneling or undermining noted. There is a medium amount of serous drainage noted. The wound margin is distinct with the outline attached to the wound base. There is large (67-100%) red, hyper - granulation within the wound bed. There is a small (1-33%) amount of necrotic tissue within the wound bed including Adherent Slough. The periwound skin appearance exhibited: Induration, Scarring. The periwound skin appearance did not exhibit: Callus, Crepitus, Excoriation, Rash, Dry/Scaly, Maceration, Atrophie Blanche, Cyanosis, Ecchymosis, Hemosiderin Staining, Mottled, Pallor, Rubor, Erythema. Periwound temperature was noted as No Abnormality. The periwound has tenderness on  palpation. MAKENA, MCGRADY ([id number]) Wound #4 status is Open. Original cause of wound was Trauma. The wound is located on the Left,Medial Lower Leg. The wound measures 0.7cm length x 1.2cm width x 0.1cm depth; 0.66cm^2 area and 0.066cm^3 volume. There is a medium amount of serosanguineous drainage noted. The wound margin is flat and intact. There is no granulation within the wound bed.  There is a large (67-100%) amount of necrotic tissue within the wound bed including Eschar. Assessment Active Problems ICD-10 I89.0 - Lymphedema, not elsewhere classified S81.812A - Laceration without foreign body, left lower leg, initial encounter L97.222 - Non-pressure chronic ulcer of left calf with fat layer exposed L97.221 - Non-pressure chronic ulcer of left calf limited to breakdown of skin Plan Wound Cleansing: Wound #1 Left,Midline,Posterior Lower Leg: Clean wound with Normal Saline. Cleanse wound with mild soap and water May Shower, gently pat wound dry prior to applying new dressing. Anesthetic (add to Medication List): Wound #1 Left,Midline,Posterior Lower Leg: Topical Lidocaine 4% cream applied to wound bed prior to debridement (In Clinic Only). Primary Wound Dressing: Wound #1 Left,Midline,Posterior Lower Leg: Silver Collagen Wound #4 Left,Medial Lower Leg: Silver Collagen Secondary Dressing: Wound #1 Left,Midline,Posterior Lower Leg: ABD pad - kerlix wrap and ace wrap Drawtex Wound #4 Left,Medial Lower Leg: ABD pad - kerlix wrap and ace wrap Drawtex Dressing Change Frequency: Wound #1 Left,Midline,Posterior Lower Leg: Change dressing every other day. Follow-up Appointments: Return Appointment in 2 weeks. Edema Control: Wound #1 Left,Midline,Posterior Lower Leg: Patient to wear own compression stockings Additional Orders / Instructions: Wound #1 Left,Midline,Posterior Lower Leg: Vitamin A; Vitamin C, Zinc Increase protein intake. EMARI, DEMMER ([id number]) At this point  I'm gonna suggest that we continue with the current wound care measures we weld the Prisma to the new areas that opened as well. Patient is in agreement the plan. We are going to change to a Kerlex and ace wrap which his wife can change at home as well. Otherwise we will see were things stand in two I did advise the wife to keep an eye on the wound area and specifically the periwound for the possibility of worsening infection. Weeks time. She will do so and let me know if they need to be seen sooner than two weeks. Please see above for specific wound care orders. We will see patient for re-evaluation in 2 week(s) here in the clinic. If anything worsens or changes patient will contact our office for additional recommendations. Electronic Signature(s) Signed: 04/11/2017 8:40:52 AM By: Worthy Keeler PA-C Entered By: Worthy Keeler on 04/10/2017 10:35:18 [name] ([id number]) -------------------------------------------------------------------------------- ROS/PFSH Details Patient Name: [name] Date of Service: 04/10/2017 9:30 AM Medical Record Number: [id number] Patient Account Number: [id number] Date of Birth/Sex: [birth year] (84 y.o. M) Treating RN: Roger Shelter Primary Care Provider: Gaynelle Arabian Other Clinician: Referring Provider: Gaynelle Arabian Treating Provider/Extender: Melburn Hake, HOYT Weeks in Treatment: 15 Information Obtained From Patient Wound History Do you currently have one or more open woundso Yes How many open wounds do you currently haveo 2 Approximately how long have you had your woundso 4 weeks How have you been treating your wound(s) until nowo neosporin Has your wound(s) ever healed and then re-openedo No Have you had any lab work done in the past montho No Have you tested positive for an antibiotic resistant organism (MRSA, VRE)o No Have you tested positive for osteomyelitis (bone infection)o No Have you had any tests for circulation on your legso  No Constitutional Symptoms (General Health) Complaints and Symptoms: Negative for: Fever; Chills Cardiovascular Complaints and Symptoms: Positive for: LE edema Medical History: Positive for: Arrhythmia - A- Fib Negative for: Angina; Congestive Heart Failure; Coronary Artery Disease; Deep Vein Thrombosis; Hypertension; Hypotension; Myocardial Infarction; Peripheral Arterial Disease; Peripheral Venous Disease; Phlebitis; Vasculitis Eyes Medical History: Negative for: Cataracts; Glaucoma; Optic Neuritis Ear/Nose/Mouth/Throat Medical History: Negative for: Chronic sinus problems/congestion;  Middle ear problems Hematologic/Lymphatic Medical History: Negative for: Anemia; Hemophilia; Human Immunodeficiency Virus; Lymphedema; Sickle Cell Disease Respiratory Complaints and Symptoms: No Complaints or Symptoms Medical History: Negative for: Aspiration; Asthma; Chronic Obstructive Pulmonary Disease (COPD); Pneumothorax; Sleep Apnea; NARADA, UZZLE ([id number]) Tuberculosis Gastrointestinal Medical History: Negative for: Cirrhosis ; Colitis; Crohnos; Hepatitis A; Hepatitis B; Hepatitis C Endocrine Medical History: Negative for: Type I Diabetes; Type II Diabetes Genitourinary Medical History: Negative for: End Stage Renal Disease Immunological Medical History: Negative for: Lupus Erythematosus; Raynaudos Integumentary (Skin) Medical History: Negative for: History of Burn; History of pressure wounds Musculoskeletal Medical History: Negative for: Gout; Rheumatoid Arthritis; Osteoarthritis; Osteomyelitis Neurologic Medical History: Negative for: Dementia; Neuropathy; Quadriplegia; Paraplegia; Seizure Disorder Psychiatric Complaints and Symptoms: No Complaints or Symptoms Medical History: Negative for: Anorexia/bulimia; Confinement Anxiety Immunizations Pneumococcal Vaccine: Received Pneumococcal Vaccination: No Implantable Devices Family and Social History Cancer: No;  Diabetes: Yes - Child; Heart Disease: No; Hypertension: Yes - Child; Kidney Disease: No; Lung Disease: No; Seizures: No; Stroke: No; Thyroid Problems: No; Tuberculosis: No; Former smoker - 107 years +; Marital Status - Married; Alcohol Use: Never; Drug Use: No History; Caffeine Use: Rarely; Financial Concerns: No; Food, Clothing or Shelter Needs: No; Support System Lacking: No; Transportation Concerns: No; Advanced Directives: No; Patient does not want information on Advanced Directives; Do not resuscitate: No; Living Will: No; Medical Power of Attorney: No Physician Affirmation RAIHAN, KIMMEL ([id number]) I have reviewed and agree with the above information. Electronic Signature(s) Signed: 04/10/2017 5:00:26 PM By: Roger Shelter Signed: 04/11/2017 8:40:52 AM By: Worthy Keeler PA-C Entered By: Worthy Keeler on 04/10/2017 10:33:49 [name] ([id number]) -------------------------------------------------------------------------------- SuperBill Details Patient Name: [name] Date of Service: 04/10/2017 Medical Record Number: [id number] Patient Account Number: [id number] Date of Birth/Sex: 02-07-1930 (84 y.o. M) Treating RN: Roger Shelter Primary Care Provider: Gaynelle Arabian Other Clinician: Referring Provider: Gaynelle Arabian Treating Provider/Extender: Melburn Hake, HOYT Weeks in Treatment: 15 Diagnosis Coding ICD-10 Codes Code Description I89.0 Lymphedema, not elsewhere classified S81.812A Laceration without foreign body, left lower leg, initial encounter L97.222 Non-pressure chronic ulcer of left calf with fat layer exposed L97.221 Non-pressure chronic ulcer of left calf limited to breakdown of skin Facility Procedures CPT4 Code: [id number] Description: 45364 - WOUND CARE VISIT-LEV 2 EST PT Modifier: Quantity: 1 Physician Procedures CPT4 Code: 6803212 Description: 99213 - WC PHYS LEVEL 3 - EST PT ICD-10 Diagnosis Description I89.0 Lymphedema, not elsewhere classified  S81.812A Laceration without foreign body, left lower leg, initial enc L97.222 Non-pressure chronic ulcer of left calf with fat layer  expos L97.221 Non-pressure chronic ulcer of left calf limited to breakdown Modifier: ounter ed of skin Quantity: 1 Electronic Signature(s) Signed: 04/11/2017 8:40:52 AM By: Worthy Keeler PA-C Entered By: Worthy Keeler on 04/10/2017 10:35:45

## 2017-04-24 ENCOUNTER — Encounter: Payer: Medicare Other | Admitting: Physician Assistant

## 2017-04-24 DIAGNOSIS — E11622 Type 2 diabetes mellitus with other skin ulcer: Secondary | ICD-10-CM | POA: Diagnosis not present

## 2017-04-28 ENCOUNTER — Ambulatory Visit: Payer: Medicare Other

## 2017-04-28 DIAGNOSIS — Z5181 Encounter for therapeutic drug level monitoring: Secondary | ICD-10-CM | POA: Diagnosis not present

## 2017-04-28 DIAGNOSIS — I4891 Unspecified atrial fibrillation: Secondary | ICD-10-CM

## 2017-04-28 DIAGNOSIS — I6789 Other cerebrovascular disease: Secondary | ICD-10-CM

## 2017-04-28 LAB — POCT INR: INR: 2.3

## 2017-04-28 NOTE — Progress Notes (Signed)
[name], [name] ([id number]) Visit Report for 04/24/2017 Arrival Information Details Patient Name: [name], [name]. Date of Service: 04/24/2017 9:30 AM Medical Record Number: [id number] Patient Account Number: [id number] Date of Birth/Sex: 07/30/1930 (84 y.o. M) Treating RN: Montey Hora Primary Care Devon Kingdon: Gaynelle Arabian Other Clinician: Referring Tunya Held: Gaynelle Arabian Treating Rosely Fernandez/Extender: Melburn Hake, HOYT Weeks in Treatment: 47 Visit Information History Since Last Visit Added or deleted any medications: No Patient Arrived: Ambulatory Any new allergies or adverse reactions: No Arrival Time: 09:37 Had a fall or experienced change in No Accompanied By: spouse activities of daily living that may affect Transfer Assistance: None risk of falls: Patient Identification Verified: Yes Signs or symptoms of abuse/neglect since last visito No Secondary Verification Process Completed: Yes Hospitalized since last visit: No Patient Requires Transmission-Based No Implantable device outside of the clinic excluding No Precautions: cellular tissue based products placed in the center Patient Has Alerts: No since last visit: Has Dressing in Place as Prescribed: Yes Pain Present Now: No Electronic Signature(s) Signed: 04/24/2017 4:05:33 PM By: Montey Hora Entered By: Montey Hora on 04/24/2017 09:38:06 [name] ([id number]) -------------------------------------------------------------------------------- Clinic Level of Care Assessment Details Patient Name: [name] Date of Service: 04/24/2017 9:30 AM Medical Record Number: [id number] Patient Account Number: [id number] Date of Birth/Sex: [birth year] (84 y.o. M) Treating RN: Montey Hora Primary Care Shontay Wallner: Gaynelle Arabian Other Clinician: Referring Mason Burleigh: Gaynelle Arabian Treating Aisha Greenberger/Extender: Melburn Hake, HOYT Weeks in Treatment: 17 Clinic Level of Care Assessment Items TOOL 4 Quantity Score []  - Use  when only an EandM is performed on FOLLOW-UP visit 0 ASSESSMENTS - Nursing Assessment / Reassessment X - Reassessment of Co-morbidities (includes updates in patient status) 1 10 X- 1 5 Reassessment of Adherence to Treatment Plan ASSESSMENTS - Wound and Skin Assessment / Reassessment X - Simple Wound Assessment / Reassessment - one wound 1 5 []  - 0 Complex Wound Assessment / Reassessment - multiple wounds []  - 0 Dermatologic / Skin Assessment (not related to wound area) ASSESSMENTS - Focused Assessment []  - Circumferential Edema Measurements - multi extremities 0 []  - 0 Nutritional Assessment / Counseling / Intervention []  - 0 Lower Extremity Assessment (monofilament, tuning fork, pulses) []  - 0 Peripheral Arterial Disease Assessment (using hand held doppler) ASSESSMENTS - Ostomy and/or Continence Assessment and Care []  - Incontinence Assessment and Management 0 []  - 0 Ostomy Care Assessment and Management (repouching, etc.) PROCESS - Coordination of Care X - Simple Patient / Family Education for ongoing care 1 15 []  - 0 Complex (extensive) Patient / Family Education for ongoing care []  - 0 Staff obtains Programmer, systems, Records, Test Results / Process Orders []  - 0 Staff telephones HHA, Nursing Homes / Clarify orders / etc []  - 0 Routine Transfer to another Facility (non-emergent condition) []  - 0 Routine Hospital Admission (non-emergent condition) []  - 0 New Admissions / Biomedical engineer / Ordering NPWT, Apligraf, etc. []  - 0 Emergency Hospital Admission (emergent condition) X- 1 10 Simple Discharge Coordination [name], [name] ([id number]) []  - 0 Complex (extensive) Discharge Coordination PROCESS - Special Needs []  - Pediatric / Minor Patient Management 0 []  - 0 Isolation Patient Management []  - 0 Hearing / Language / Visual special needs []  - 0 Assessment of Community assistance (transportation, D/C planning, etc.) []  - 0 Additional assistance / Altered  mentation []  - 0 Support Surface(s) Assessment (bed, cushion, seat, etc.) INTERVENTIONS - Wound Cleansing / Measurement X - Simple Wound Cleansing - one wound 1 5 []  - 0 Complex Wound  Cleansing - multiple wounds X- 1 5 Wound Imaging (photographs - any number of wounds) []  - 0 Wound Tracing (instead of photographs) X- 1 5 Simple Wound Measurement - one wound []  - 0 Complex Wound Measurement - multiple wounds INTERVENTIONS - Wound Dressings X - Small Wound Dressing one or multiple wounds 1 10 []  - 0 Medium Wound Dressing one or multiple wounds []  - 0 Large Wound Dressing one or multiple wounds []  - 0 Application of Medications - topical []  - 0 Application of Medications - injection INTERVENTIONS - Miscellaneous []  - External ear exam 0 []  - 0 Specimen Collection (cultures, biopsies, blood, body fluids, etc.) []  - 0 Specimen(s) / Culture(s) sent or taken to Lab for analysis []  - 0 Patient Transfer (multiple staff / Civil Service fast streamer / Similar devices) []  - 0 Simple Staple / Suture removal (25 or less) []  - 0 Complex Staple / Suture removal (26 or more) []  - 0 Hypo / Hyperglycemic Management (close monitor of Blood Glucose) []  - 0 Ankle / Brachial Index (ABI) - do not check if billed separately X- 1 5 Vital Signs [name], Danny J. ([id number]) Has the patient been seen at the hospital within the last three years: Yes Total Score: 75 Level Of Care: New/Established - Level 2 Electronic Signature(s) Signed: 04/24/2017 4:05:33 PM By: Montey Hora Entered By: Montey Hora on 04/24/2017 10:15:36 [name] ([id number]) -------------------------------------------------------------------------------- Encounter Discharge Information Details Patient Name: [name] Date of Service: 04/24/2017 9:30 AM Medical Record Number: [id number] Patient Account Number: [id number] Date of Birth/Sex: 1930-02-03 (84 y.o. M) Treating RN: Ahmed Prima Primary Care Makye Radle: Gaynelle Arabian Other Clinician: Referring Breelynn Bankert: Gaynelle Arabian Treating Haruna Rohlfs/Extender: Melburn Hake, HOYT Weeks in Treatment: 20 Encounter Discharge Information Items Discharge Pain Level: 0 Discharge Condition: Stable Ambulatory Status: Ambulatory Discharge Destination: Home Private Transportation: Auto Accompanied By: spouse Schedule Follow-up Appointment: No Medication Reconciliation completed and provided No to Patient/Care Cressida Milford: Clinical Summary of Care: Electronic Signature(s) Signed: 04/24/2017 4:22:27 PM By: Alric Quan Entered By: Alric Quan on 04/24/2017 10:23:35 [name] ([id number]) -------------------------------------------------------------------------------- Lower Extremity Assessment Details Patient Name: [name] Date of Service: 04/24/2017 9:30 AM Medical Record Number: [id number] Patient Account Number: [id number] Date of Birth/Sex: Sep 24, 1930 (84 y.o. M) Treating RN: Montey Hora Primary Care Arland Usery: Gaynelle Arabian Other Clinician: Referring Natania Finigan: Gaynelle Arabian Treating Sourish Allender/Extender: Melburn Hake, HOYT Weeks in Treatment: 17 Edema Assessment Assessed: [Left: No] [Right: No] [Left: Edema] [Right: :] Calf Left: Right: Point of Measurement: 34 cm From Medial Instep 39.1 cm cm Ankle Left: Right: Point of Measurement: 13 cm From Medial Instep 21.5 cm cm Vascular Assessment Pulses: Dorsalis Pedis Palpable: [Left:Yes] Posterior Tibial Extremity colors, hair growth, and conditions: Extremity Color: [Left:Hyperpigmented] Hair Growth on Extremity: [Left:No] Temperature of Extremity: [Left:Warm] Capillary Refill: [Left:< 3 seconds] Toe Nail Assessment Left: Right: Thick: Yes Discolored: Yes Deformed: Yes Improper Length and Hygiene: Yes Electronic Signature(s) Signed: 04/24/2017 4:05:33 PM By: Montey Hora Entered By: Montey Hora on 04/24/2017 09:46:07 [name]  ([id number]) -------------------------------------------------------------------------------- Multi Wound Chart Details Patient Name: [name] Date of Service: 04/24/2017 9:30 AM Medical Record Number: [id number] Patient Account Number: [id number] Date of Birth/Sex: November 17, 1930 (84 y.o. M) Treating RN: Montey Hora Primary Care Rasheka Denard: Gaynelle Arabian Other Clinician: Referring Sam Overbeck: Gaynelle Arabian Treating Criss Bartles/Extender: Melburn Hake, HOYT Weeks in Treatment: 17 Vital Signs Height(in): 72 Pulse(bpm): 94 Weight(lbs): 217 Blood Pressure(mmHg): 138/72 Body Mass Index(BMI): 29 Temperature(F): 98.2 Respiratory Rate 16 (breaths/min): Photos: [1:No Photos] [4:No Photos] [  N/A:N/A] Wound Location: [1:Left Lower Leg - Midline, Posterior] [4:Left, Medial Lower Leg] [N/A:N/A] Wounding Event: [1:Gradually Appeared] [4:Trauma] [N/A:N/A] Primary Etiology: [1:Venous Leg Ulcer] [4:Trauma, Other] [N/A:N/A] Comorbid History: [1:Arrhythmia] [4:N/A] [N/A:N/A] Date Acquired: [1:12/01/2016] [4:03/09/2017] [N/A:N/A] Weeks of Treatment: [1:17] [4:2] [N/A:N/A] Wound Status: [1:Open] [4:Healed - Epithelialized] [N/A:N/A] Measurements L x W x D [1:0.7x0.9x0.1] [4:0x0x0] [N/A:N/A] (cm) Area (cm) : [1:0.495] [4:0] [N/A:N/A] Volume (cm) : [1:0.049] [4:0] [N/A:N/A] % Reduction in Area: [1:58.00%] [4:100.00%] [N/A:N/A] % Reduction in Volume: [1:58.50%] [4:100.00%] [N/A:N/A] Classification: [1:Full Thickness Without Exposed Support Structures] [4:Full Thickness Without Exposed Support Structures] [N/A:N/A] Exudate Amount: [1:Medium] [4:N/A] [N/A:N/A] Exudate Type: [1:Sanguinous] [4:N/A] [N/A:N/A] Exudate Color: [1:red] [4:N/A] [N/A:N/A] Wound Margin: [1:Distinct, outline attached] [4:N/A] [N/A:N/A] Granulation Amount: [1:Large (67-100%)] [4:N/A] [N/A:N/A] Granulation Quality: [1:Red, Hyper-granulation] [4:N/A] [N/A:N/A] Necrotic Amount: [1:Small (1-33%)] [4:N/A] [N/A:N/A] Exposed  Structures: [1:Fat Layer (Subcutaneous Tissue) Exposed: Yes] [4:N/A] [N/A:N/A] Epithelialization: [1:None] [4:N/A] [N/A:N/A] Periwound Skin Texture: [1:Induration: Yes Scarring: Yes Excoriation: No Callus: No Crepitus: No Rash: No] [4:No Abnormalities Noted] [N/A:N/A] Periwound Skin Moisture: [1:Maceration: No Dry/Scaly: No] [4:No Abnormalities Noted] [N/A:N/A] Periwound Skin Color: [1:Atrophie Blanche: No Cyanosis: No] [4:No Abnormalities Noted] [N/A:N/A] Ecchymosis: No Erythema: No Hemosiderin Staining: No Mottled: No Pallor: No Rubor: No Temperature: No Abnormality N/A N/A Tenderness on Palpation: Yes No N/A Wound Preparation: Ulcer Cleansing: N/A N/A Rinsed/Irrigated with Saline Topical Anesthetic Applied: Other: lidocaine 4% Treatment Notes Electronic Signature(s) Signed: 04/24/2017 4:05:33 PM By: Montey Hora Entered By: Montey Hora on 04/24/2017 10:11:48 [name] ([id number]) -------------------------------------------------------------------------------- Multi-Disciplinary Care Plan Details Patient Name: [name] Date of Service: 04/24/2017 9:30 AM Medical Record Number: [id number] Patient Account Number: [id number] Date of Birth/Sex: 10-31-1930 (84 y.o. M) Treating RN: Montey Hora Primary Care Jairon Ripberger: Gaynelle Arabian Other Clinician: Referring Taronda Comacho: Gaynelle Arabian Treating Jakoby Melendrez/Extender: Melburn Hake, HOYT Weeks in Treatment: 17 Active Inactive ` Orientation to the Wound Care Program Nursing Diagnoses: Knowledge deficit related to the wound healing center program Goals: Patient/caregiver will verbalize understanding of the [facility] Program Date Initiated: 12/22/2016 Target Resolution Date: 01/12/2017 Goal Status: Active Interventions: Provide education on orientation to the wound center Notes: ` Wound/Skin Impairment Nursing Diagnoses: Impaired tissue integrity Knowledge deficit related to ulceration/compromised skin  integrity Goals: Patient/caregiver will verbalize understanding of skin care regimen Date Initiated: 12/22/2016 Target Resolution Date: 01/12/2017 Goal Status: Active Ulcer/skin breakdown will have a volume reduction of 30% by week 4 Date Initiated: 12/22/2016 Target Resolution Date: 01/12/2017 Goal Status: Active Interventions: Assess patient/caregiver ability to obtain necessary supplies Assess ulceration(s) every visit Treatment Activities: Skin care regimen initiated : 12/22/2016 Notes: Electronic Signature(s) Signed: 04/24/2017 4:05:33 PM By: Geralyn Corwin ([id number]) Entered By: Montey Hora on 04/24/2017 10:11:37 [name] ([id number]) -------------------------------------------------------------------------------- Pain Assessment Details Patient Name: [name] Date of Service: 04/24/2017 9:30 AM Medical Record Number: [id number] Patient Account Number: [id number] Date of Birth/Sex: 08-23-1930 (84 y.o. M) Treating RN: Montey Hora Primary Care Corie Vavra: Gaynelle Arabian Other Clinician: Referring Reyanne Hussar: Gaynelle Arabian Treating Mili Piltz/Extender: Melburn Hake, HOYT Weeks in Treatment: 17 Active Problems Location of Pain Severity and Description of Pain Patient Has Paino No Site Locations Pain Management and Medication Current Pain Management: Electronic Signature(s) Signed: 04/24/2017 4:05:33 PM By: Montey Hora Entered By: Montey Hora on 04/24/2017 09:40:12 [name] ([id number]) -------------------------------------------------------------------------------- Patient/Caregiver Education Details Patient Name: [name] Date of Service: 04/24/2017 9:30 AM Medical Record Number: [id number] Patient Account Number: [id number] Date of Birth/Gender: [birth year] (84 y.o. M) Treating RN: Ahmed Prima Primary Care  Physician: Gaynelle Arabian Other Clinician: Referring Physician: Gaynelle Arabian Treating Physician/Extender: Sharalyn Ink in Treatment: 49 Education Assessment Education Provided To: Patient Education Topics Provided Wound/Skin Impairment: Handouts: Caring for Your Ulcer, Other: change dressing as ordered Methods: Demonstration, Explain/Verbal Responses: State content correctly Electronic Signature(s) Signed: 04/24/2017 4:22:27 PM By: Alric Quan Entered By: Alric Quan on 04/24/2017 10:23:14 [name] ([id number]) -------------------------------------------------------------------------------- Wound Assessment Details Patient Name: [name] Date of Service: 04/24/2017 9:30 AM Medical Record Number: [id number] Patient Account Number: [id number] Date of Birth/Sex: Mar 12, 1930 (84 y.o. M) Treating RN: Montey Hora Primary Care Felicite Zeimet: Gaynelle Arabian Other Clinician: Referring Tahisha Hakim: Gaynelle Arabian Treating Waylon Hershey/Extender: STONE III, HOYT Weeks in Treatment: 17 Wound Status Wound Number: 1 Primary Etiology: Venous Leg Ulcer Wound Location: Left Lower Leg - Midline, Posterior Wound Status: Open Wounding Event: Gradually Appeared Comorbid History: Arrhythmia Date Acquired: 12/01/2016 Weeks Of Treatment: 17 Clustered Wound: No Photos Photo Uploaded By: Montey Hora on 04/24/2017 10:52:21 Wound Measurements Length: (cm) 0.7 Width: (cm) 0.9 Depth: (cm) 0.1 Area: (cm) 0.495 Volume: (cm) 0.049 % Reduction in Area: 58% % Reduction in Volume: 58.5% Epithelialization: None Tunneling: No Undermining: No Wound Description Full Thickness Without Exposed Support Classification: Structures Wound Margin: Distinct, outline attached Exudate Medium Amount: Exudate Type: Sanguinous Exudate Color: red Foul Odor After Cleansing: No Slough/Fibrino No Wound Bed Granulation Amount: Large (67-100%) Exposed Structure Granulation Quality: Red, Hyper-granulation Fat Layer (Subcutaneous Tissue) Exposed: Yes Necrotic Amount: Small (1-33%) Necrotic  Quality: Adherent Slough Periwound Skin Texture Texture Color No Abnormalities Noted: No No Abnormalities Noted: No CALVYN, [name]. ([id number]) Callus: No Atrophie Blanche: No Crepitus: No Cyanosis: No Excoriation: No Ecchymosis: No Induration: Yes Erythema: No Rash: No Hemosiderin Staining: No Scarring: Yes Mottled: No Pallor: No Moisture Rubor: No No Abnormalities Noted: No Dry / Scaly: No Temperature / Pain Maceration: No Temperature: No Abnormality Tenderness on Palpation: Yes Wound Preparation Ulcer Cleansing: Rinsed/Irrigated with Saline Topical Anesthetic Applied: Other: lidocaine 4%, Treatment Notes Wound #1 (Left, Midline, Posterior Lower Leg) 1. Cleansed with: Clean wound with Normal Saline 2. Anesthetic Topical Lidocaine 4% cream to wound bed prior to debridement 4. Dressing Applied: Prisma Ag 5. Secondary Dressing Applied Kerlix/Conform Non-Adherent pad 7. Secured with Tape Other (specify in notes) Notes ace wrap Electronic Signature(s) Signed: 04/24/2017 4:05:33 PM By: Montey Hora Entered By: Montey Hora on 04/24/2017 09:44:21 [name] ([id number]) -------------------------------------------------------------------------------- Wound Assessment Details Patient Name: [name] Date of Service: 04/24/2017 9:30 AM Medical Record Number: [id number] Patient Account Number: [id number] Date of Birth/Sex: 1930-09-21 (84 y.o. M) Treating RN: Montey Hora Primary Care Jillienne Egner: Gaynelle Arabian Other Clinician: Referring Meila Berke: Gaynelle Arabian Treating Kuba Shepherd/Extender: STONE III, HOYT Weeks in Treatment: 17 Wound Status Wound Number: 4 Primary Etiology: Trauma, Other Wound Location: Left, Medial Lower Leg Wound Status: Healed - Epithelialized Wounding Event: Trauma Date Acquired: 03/09/2017 Weeks Of Treatment: 2 Clustered Wound: No Wound Measurements Length: (cm) 0 Width: (cm) 0 Depth: (cm) 0 Area: (cm) 0 Volume:  (cm) 0 % Reduction in Area: 100% % Reduction in Volume: 100% Wound Description Full Thickness Without Exposed Support Classification: Structures Periwound Skin Texture Texture Color No Abnormalities Noted: No No Abnormalities Noted: No Moisture No Abnormalities Noted: No Electronic Signature(s) Signed: 04/24/2017 4:05:33 PM By: Montey Hora Entered By: Montey Hora on 04/24/2017 09:43:54 Galyean, Jeoffrey Massed ([id number]) -------------------------------------------------------------------------------- Vitals Details Patient Name: [name] Date of Service: 04/24/2017 9:30 AM Medical Record Number: [id number] Patient Account Number: [id number] Date of Birth/Sex: [birth year] (84  y.o. M) Treating RN: Montey Hora Primary Care Maryl Blalock: Gaynelle Arabian Other Clinician: Referring Buffy Ehler: Gaynelle Arabian Treating Tiombe Tomeo/Extender: Melburn Hake, HOYT Weeks in Treatment: 17 Vital Signs Time Taken: 09:40 Temperature (F): 98.2 Height (in): 72 Pulse (bpm): 94 Weight (lbs): 217 Respiratory Rate (breaths/min): 16 Body Mass Index (BMI): 29.4 Blood Pressure (mmHg): 138/72 Reference Range: 80 - 120 mg / dl Electronic Signature(s) Signed: 04/24/2017 4:05:33 PM By: Montey Hora Entered By: Montey Hora on 04/24/2017 09:40:37

## 2017-04-28 NOTE — Patient Instructions (Signed)
Description   Continue taking 1 tablet daily except 1 and 1/2 tablets on Sundays and  Wednesdays.  Recheck in 6 weeks. Call us with any medication changes #[phone]      

## 2017-04-28 NOTE — Progress Notes (Signed)
[name], [name] ([id number]) Visit Report for 04/24/2017 Chief Complaint Document Details Patient Name: [name], CHO. Date of Service: 04/24/2017 9:30 AM Medical Record Number: [id number] Patient Account Number: [id number] Date of Birth/Sex: 1930/10/22 (84 y.o. M) Treating RN: Roger Shelter Primary Care Provider: Gaynelle Arabian Other Clinician: Referring Provider: Gaynelle Arabian Treating Provider/Extender: Melburn Hake, Augustus Zurawski Weeks in Treatment: 17 Information Obtained from: Patient Chief Complaint He is seen for follow up evaluation of lle ulcers Electronic Signature(s) Signed: 04/24/2017 3:59:23 PM By: Worthy Keeler PA-C Entered By: Worthy Keeler on 04/24/2017 09:50:49 [name] ([id number]) -------------------------------------------------------------------------------- HPI Details Patient Name: [name] Date of Service: 04/24/2017 9:30 AM Medical Record Number: [id number] Patient Account Number: [id number] Date of Birth/Sex: 1930/09/24 (84 y.o. M) Treating RN: Roger Shelter Primary Care Provider: Gaynelle Arabian Other Clinician: Referring Provider: Gaynelle Arabian Treating Provider/Extender: Melburn Hake, Ceairra Mccarver Weeks in Treatment: 17 History of Present Illness Location: left lower extremity swelling with ulceration on the posterior calf Modifying Factors: Other treatment(s) tried include:and seen his PCP who thought there was cellulitis and infection and put him on doxycycline for 2 weeks HPI Description: 84 year old gentleman has been referred to [place] by his PCP Dr. Gaynelle Arabian, for a ulcer on the left posterior lower leg which has been there for about 6 weeks. The patient was thought to have an infection and was put on doxycycline for 14 days and referred to the wound center. He has a past medical history of paroxysmal atrial fibrillation, hypertension, diverticulosis, status post right hip replacement, appendectomy. He has not been a smoker since 50. the  anterior shin wound has been more recent about 2 weeks and this was caused probably due to a blunt abrasion. 03/06/17 on evaluation today patient's ulcer appears to show signs of excellent improvement. He is definitely showing additional evidence of granulation which is great news. Overall I'm extremely pleased with the progress that he has been making from the beginning to where we are right now. With that being said I would like to potentially try different dressing to see if we could stimulate this to heal even faster. 03/13/17 on evaluation today patient appears to be doing little better in regard to his leg ulcer at this time. He has been tolerating the dressing changes without complication. There does not appear to be any evidence of infection which is good news. He does have some additional granulation observed at this point and I do believe that the silver collagen dressing has been of benefit in this regard. Overall I'm very pleased with the progress she's made since last visit the only downside is that he does have additional maceration due to drainage compared to previous we may need to do something to address this. 03/27/17 on evaluation today patient appears to be doing well in regard to his left posterior lower surety ulcer. He has a significant area of epithelial growth which seems to be doing excellent at this point in time. He also seems to be well controlled as far as slough is concerned and drainage there is no evidence of maceration and there really was no slough that could not be cleaned off with saline and gauze. Overall patient seems to be making excellent progress. 04/10/17 on evaluation today patient appears to be doing better in regard to the original ulcer for which we have been treating him. Unfortunately he has two areas where he sustained injury to his posterior leg more proximal to where the current ulceration is. This was due  to it appears to be tape and adhesive at this  point. When he was using the Allevyn dressing there was no problems however they ran out of these and since that point there seems to have been more irritation around the wound from the Band-Aids that his wife has been using in their place. Other than this which appear to be very superficial new ulcers everything appears to be progressing nicely. 04/24/17 on evaluation today patient appears to be doing excellent in regard to his left posterior lower extremity ulcer. He is been tolerating the dressing changes without complication fortunately there does not appear to be any evidence of infection at this point in time. Overall he has made significant improvements since I last saw him. Electronic Signature(s) Signed: 04/24/2017 3:59:23 PM By: Worthy Keeler PA-C Entered By: Worthy Keeler on 04/24/2017 10:18:51 [name] ([id number]) -------------------------------------------------------------------------------- Physical Exam Details Patient Name: [name], [name] Date of Service: 04/24/2017 9:30 AM Medical Record Number: [id number] Patient Account Number: [id number] Date of Birth/Sex: 05/29/1930 (84 y.o. M) Treating RN: Roger Shelter Primary Care Provider: Gaynelle Arabian Other Clinician: Referring Provider: Gaynelle Arabian Treating Provider/Extender: STONE III, Maury Bamba Weeks in Treatment: 42 Constitutional Well-nourished and well-hydrated in no acute distress. Respiratory normal breathing without difficulty. clear to auscultation bilaterally. Cardiovascular regular rate and rhythm with normal S1, S2. 1+ pitting edema of the bilateral lower extremities. Psychiatric this patient is able to make decisions and demonstrates good insight into disease process. Alert and Oriented x 3. pleasant and cooperative. Notes Patient's wound at this point shows an excellent granular surface and is very small compared to what it has been in the past. Overall I'm very pleased with the appearance and no  sharp debridement was necessary today. Electronic Signature(s) Signed: 04/24/2017 3:59:23 PM By: Worthy Keeler PA-C Entered By: Worthy Keeler on 04/24/2017 10:19:58 [name] ([id number]) -------------------------------------------------------------------------------- Physician Orders Details Patient Name: [name] Date of Service: 04/24/2017 9:30 AM Medical Record Number: [id number] Patient Account Number: [id number] Date of Birth/Sex: 1930-08-22 (84 y.o. M) Treating RN: Montey Hora Primary Care Provider: Gaynelle Arabian Other Clinician: Referring Provider: Gaynelle Arabian Treating Provider/Extender: Melburn Hake, Aydeen Blume Weeks in Treatment: 53 Verbal / Phone Orders: No Diagnosis Coding ICD-10 Coding Code Description I89.0 Lymphedema, not elsewhere classified S81.812A Laceration without foreign body, left lower leg, initial encounter L97.222 Non-pressure chronic ulcer of left calf with fat layer exposed L97.221 Non-pressure chronic ulcer of left calf limited to breakdown of skin Wound Cleansing Wound #1 Left,Midline,Posterior Lower Leg o Clean wound with Normal Saline. o Cleanse wound with mild soap and water o May Shower, gently pat wound dry prior to applying new dressing. Anesthetic (add to Medication List) Wound #1 Left,Midline,Posterior Lower Leg o Topical Lidocaine 4% cream applied to wound bed prior to debridement (In Clinic Only). Primary Wound Dressing Wound #1 Left,Midline,Posterior Lower Leg o Silver Collagen Secondary Dressing Wound #1 Left,Midline,Posterior Lower Leg o Non-adherent pad Dressing Change Frequency Wound #1 Left,Midline,Posterior Lower Leg o Change dressing every other day. Follow-up Appointments o Return Appointment in 2 weeks. Edema Control Wound #1 Left,Midline,Posterior Lower Leg o Patient to wear own compression stockings Additional Orders / Instructions Wound #1 Left,Midline,Posterior Lower Leg o Vitamin A;  Vitamin C, Zinc o Increase protein intake. [name], [name] ([id number]) Electronic Signature(s) Signed: 04/24/2017 3:59:23 PM By: Worthy Keeler PA-C Signed: 04/24/2017 4:05:33 PM By: Montey Hora Entered By: Montey Hora on 04/24/2017 10:14:20 [name] ([id number]) -------------------------------------------------------------------------------- Problem List Details Patient Name:  Oatis, Brewster J. Date of Service: 04/24/2017 9:30 AM Medical Record Number: [id number] Patient Account Number: [id number] Date of Birth/Sex: 03/13/1930 (84 y.o. M) Treating RN: Roger Shelter Primary Care Provider: Gaynelle Arabian Other Clinician: Referring Provider: Gaynelle Arabian Treating Provider/Extender: Melburn Hake, Debar Plate Weeks in Treatment: 17 Active Problems ICD-10 Impacting Encounter Code Description Active Date Wound Healing Diagnosis I89.0 Lymphedema, not elsewhere classified 12/22/2016 Yes Z56.387F Laceration without foreign body, left lower leg, initial 12/22/2016 Yes encounter L97.222 Non-pressure chronic ulcer of left calf with fat layer exposed 12/22/2016 Yes L97.221 Non-pressure chronic ulcer of left calf limited to breakdown of 12/22/2016 Yes skin Inactive Problems Resolved Problems Electronic Signature(s) Signed: 04/24/2017 3:59:23 PM By: Worthy Keeler PA-C Entered By: Worthy Keeler on 04/24/2017 09:50:35 [name] ([id number]) -------------------------------------------------------------------------------- Progress Note Details Patient Name: [name] Date of Service: 04/24/2017 9:30 AM Medical Record Number: [id number] Patient Account Number: [id number] Date of Birth/Sex: 01-23-1930 (84 y.o. M) Treating RN: Roger Shelter Primary Care Provider: Gaynelle Arabian Other Clinician: Referring Provider: Gaynelle Arabian Treating Provider/Extender: Melburn Hake, Leeanna Slaby Weeks in Treatment: 17 Subjective Chief Complaint Information obtained from Patient He is seen for  follow up evaluation of lle ulcers History of Present Illness (HPI) The following HPI elements were documented for the patient's wound: Location: left lower extremity swelling with ulceration on the posterior calf Modifying Factors: Other treatment(s) tried include:and seen his PCP who thought there was cellulitis and infection and put him on doxycycline for 47 weeks 84 year old gentleman has been referred to [place] by his PCP Dr. Gaynelle Arabian, for a ulcer on the left posterior lower leg which has been there for about 6 weeks. The patient was thought to have an infection and was put on doxycycline for 14 days and referred to the wound center. He has a past medical history of paroxysmal atrial fibrillation, hypertension, diverticulosis, status post right hip replacement, appendectomy. He has not been a smoker since 44. the anterior shin wound has been more recent about 2 weeks and this was caused probably due to a blunt abrasion. 03/06/17 on evaluation today patient's ulcer appears to show signs of excellent improvement. He is definitely showing additional evidence of granulation which is great news. Overall I'm extremely pleased with the progress that he has been making from the beginning to where we are right now. With that being said I would like to potentially try different dressing to see if we could stimulate this to heal even faster. 03/13/17 on evaluation today patient appears to be doing little better in regard to his leg ulcer at this time. He has been tolerating the dressing changes without complication. There does not appear to be any evidence of infection which is good news. He does have some additional granulation observed at this point and I do believe that the silver collagen dressing has been of benefit in this regard. Overall I'm very pleased with the progress she's made since last visit the only downside is that he does have additional maceration due to drainage compared to previous  we may need to do something to address this. 03/27/17 on evaluation today patient appears to be doing well in regard to his left posterior lower surety ulcer. He has a significant area of epithelial growth which seems to be doing excellent at this point in time. He also seems to be well controlled as far as slough is concerned and drainage there is no evidence of maceration and there really was no slough that could not be cleaned  off with saline and gauze. Overall patient seems to be making excellent progress. 04/10/17 on evaluation today patient appears to be doing better in regard to the original ulcer for which we have been treating him. Unfortunately he has two areas where he sustained injury to his posterior leg more proximal to where the current ulceration is. This was due to it appears to be tape and adhesive at this point. When he was using the Allevyn dressing there was no problems however they ran out of these and since that point there seems to have been more irritation around the wound from the Band-Aids that his wife has been using in their place. Other than this which appear to be very superficial new ulcers everything appears to be progressing nicely. 04/24/17 on evaluation today patient appears to be doing excellent in regard to his left posterior lower extremity ulcer. He is been tolerating the dressing changes without complication fortunately there does not appear to be any evidence of infection at this point in time. Overall he has made significant improvements since I last saw him. Patient History Information obtained from Patient. [name], [name] ([id number]) Family History Diabetes - Child, Hypertension - Child, No family history of Cancer, Heart Disease, Kidney Disease, Lung Disease, Seizures, Stroke, Thyroid Problems, Tuberculosis. Social History Former smoker - 9 years +, Marital Status - Married, Alcohol Use - Never, Drug Use - No History, Caffeine Use - Rarely. Review of  Systems (ROS) Constitutional Symptoms (General Health) Denies complaints or symptoms of Fever, Chills. Respiratory The patient has no complaints or symptoms. Cardiovascular Complains or has symptoms of LE edema. Psychiatric The patient has no complaints or symptoms. Objective Constitutional Well-nourished and well-hydrated in no acute distress. Vitals Time Taken: 9:40 AM, Height: 72 in, Weight: 217 lbs, BMI: 29.4, Temperature: 98.2 F, Pulse: 94 bpm, Respiratory Rate: 16 breaths/min, Blood Pressure: 138/72 mmHg. Respiratory normal breathing without difficulty. clear to auscultation bilaterally. Cardiovascular regular rate and rhythm with normal S1, S2. 1+ pitting edema of the bilateral lower extremities. Psychiatric this patient is able to make decisions and demonstrates good insight into disease process. Alert and Oriented x 3. pleasant and cooperative. General Notes: Patient's wound at this point shows an excellent granular surface and is very small compared to what it has been in the past. Overall I'm very pleased with the appearance and no sharp debridement was necessary today. Integumentary (Hair, Skin) Wound #1 status is Open. Original cause of wound was Gradually Appeared. The wound is located on the Left,Midline,Posterior Lower Leg. The wound measures 0.7cm length x 0.9cm width x 0.1cm depth; 0.495cm^2 area and 0.049cm^3 volume. There is Fat Layer (Subcutaneous Tissue) Exposed exposed. There is no tunneling or undermining noted. There is a medium amount of sanguinous drainage noted. The wound margin is distinct with the outline attached to the wound base. There is large (67-100%) red, hyper - granulation within the wound bed. There is a small (1-33%) amount of necrotic tissue within the wound bed including Adherent Slough. The periwound skin appearance exhibited: Induration, Scarring. The periwound skin appearance did not exhibit: Callus, Crepitus, Excoriation, Rash, Dry/Scaly,  Maceration, Atrophie Blanche, Cyanosis, Ecchymosis, Hemosiderin Staining, Mottled, Pallor, Rubor, Erythema. Periwound temperature was noted [name], [name]. ([id number]) as No Abnormality. The periwound has tenderness on palpation. Wound #4 status is Healed - Epithelialized. Original cause of wound was Trauma. The wound is located on the Left,Medial Lower Leg. The wound measures 0cm length x 0cm width x 0cm depth; 0cm^2 area and 0cm^3 volume.  Assessment Active Problems ICD-10 I89.0 - Lymphedema, not elsewhere classified S81.812A - Laceration without foreign body, left lower leg, initial encounter L97.222 - Non-pressure chronic ulcer of left calf with fat layer exposed L97.221 - Non-pressure chronic ulcer of left calf limited to breakdown of skin Plan Wound Cleansing: Wound #1 Left,Midline,Posterior Lower Leg: Clean wound with Normal Saline. Cleanse wound with mild soap and water May Shower, gently pat wound dry prior to applying new dressing. Anesthetic (add to Medication List): Wound #1 Left,Midline,Posterior Lower Leg: Topical Lidocaine 4% cream applied to wound bed prior to debridement (In Clinic Only). Primary Wound Dressing: Wound #1 Left,Midline,Posterior Lower Leg: Silver Collagen Secondary Dressing: Wound #1 Left,Midline,Posterior Lower Leg: Non-adherent pad Dressing Change Frequency: Wound #1 Left,Midline,Posterior Lower Leg: Change dressing every other day. Follow-up Appointments: Return Appointment in 2 weeks. Edema Control: Wound #1 Left,Midline,Posterior Lower Leg: Patient to wear own compression stockings Additional Orders / Instructions: Wound #1 Left,Midline,Posterior Lower Leg: Vitamin A; Vitamin C, Zinc Increase protein intake. I'm going to suggest at this point that we continue with the Current wound care measures for the next week and the patient and his wife are in agreement with plan. Fortunately he is making excellent progress and I think he will continue  to do so my hope is that he will be healed when I see him in two weeks time. [name], [name] ([id number]) Please see above for specific wound care orders. We will see patient for re-evaluation in 2 week(s) here in the clinic. If anything worsens or changes patient will contact our office for additional recommendations. Electronic Signature(s) Signed: 04/24/2017 3:59:23 PM By: Worthy Keeler PA-C Entered By: Worthy Keeler on 04/24/2017 10:20:42 [name] ([id number]) -------------------------------------------------------------------------------- ROS/PFSH Details Patient Name: [name] Date of Service: 04/24/2017 9:30 AM Medical Record Number: [id number] Patient Account Number: [id number] Date of Birth/Sex: 1930-05-12 (84 y.o. M) Treating RN: Roger Shelter Primary Care Provider: Gaynelle Arabian Other Clinician: Referring Provider: Gaynelle Arabian Treating Provider/Extender: Melburn Hake, Jet Traynham Weeks in Treatment: 17 Information Obtained From Patient Wound History Do you currently have one or more open woundso Yes How many open wounds do you currently haveo 2 Approximately how long have you had your woundso 4 weeks How have you been treating your wound(s) until nowo neosporin Has your wound(s) ever healed and then re-openedo No Have you had any lab work done in the past montho No Have you tested positive for an antibiotic resistant organism (MRSA, VRE)o No Have you tested positive for osteomyelitis (bone infection)o No Have you had any tests for circulation on your legso No Constitutional Symptoms (General Health) Complaints and Symptoms: Negative for: Fever; Chills Cardiovascular Complaints and Symptoms: Positive for: LE edema Medical History: Positive for: Arrhythmia - A- Fib Negative for: Angina; Congestive Heart Failure; Coronary Artery Disease; Deep Vein Thrombosis; Hypertension; Hypotension; Myocardial Infarction; Peripheral Arterial Disease; Peripheral Venous  Disease; Phlebitis; Vasculitis Eyes Medical History: Negative for: Cataracts; Glaucoma; Optic Neuritis Ear/Nose/Mouth/Throat Medical History: Negative for: Chronic sinus problems/congestion; Middle ear problems Hematologic/Lymphatic Medical History: Negative for: Anemia; Hemophilia; Human Immunodeficiency Virus; Lymphedema; Sickle Cell Disease Respiratory Complaints and Symptoms: No Complaints or Symptoms Medical History: Negative for: Aspiration; Asthma; Chronic Obstructive Pulmonary Disease (COPD); Pneumothorax; Sleep Apnea; [name], [name] ([id number]) Tuberculosis Gastrointestinal Medical History: Negative for: Cirrhosis ; Colitis; Crohnos; Hepatitis A; Hepatitis B; Hepatitis C Endocrine Medical History: Negative for: Type I Diabetes; Type II Diabetes Genitourinary Medical History: Negative for: End Stage Renal Disease Immunological Medical History: Negative for: Lupus Erythematosus;  Raynaudos Integumentary (Skin) Medical History: Negative for: History of Burn; History of pressure wounds Musculoskeletal Medical History: Negative for: Gout; Rheumatoid Arthritis; Osteoarthritis; Osteomyelitis Neurologic Medical History: Negative for: Dementia; Neuropathy; Quadriplegia; Paraplegia; Seizure Disorder Psychiatric Complaints and Symptoms: No Complaints or Symptoms Medical History: Negative for: Anorexia/bulimia; Confinement Anxiety Immunizations Pneumococcal Vaccine: Received Pneumococcal Vaccination: No Implantable Devices Family and Social History Cancer: No; Diabetes: Yes - Child; Heart Disease: No; Hypertension: Yes - Child; Kidney Disease: No; Lung Disease: No; Seizures: No; Stroke: No; Thyroid Problems: No; Tuberculosis: No; Former smoker - 42 years +; Marital Status - Married; Alcohol Use: Never; Drug Use: No History; Caffeine Use: Rarely; Financial Concerns: No; Food, Clothing or Shelter Needs: No; Support System Lacking: No; Transportation Concerns: No;  Advanced Directives: No; Patient does not want information on Advanced Directives; Do not resuscitate: No; Living Will: No; Medical Power of Attorney: No Physician Affirmation [name], [name] ([id number]) I have reviewed and agree with the above information. Electronic Signature(s) Signed: 04/24/2017 3:59:23 PM By: Worthy Keeler PA-C Signed: 04/24/2017 4:13:06 PM By: Roger Shelter Entered By: Worthy Keeler on 04/24/2017 10:19:33 [name] ([id number]) -------------------------------------------------------------------------------- SuperBill Details Patient Name: [name] Date of Service: 04/24/2017 Medical Record Number: [id number] Patient Account Number: [id number] Date of Birth/Sex: 07/11/1930 (84 y.o. M) Treating RN: Roger Shelter Primary Care Provider: Gaynelle Arabian Other Clinician: Referring Provider: Gaynelle Arabian Treating Provider/Extender: Melburn Hake, Faylynn Stamos Weeks in Treatment: 17 Diagnosis Coding ICD-10 Codes Code Description I89.0 Lymphedema, not elsewhere classified S81.812A Laceration without foreign body, left lower leg, initial encounter L97.222 Non-pressure chronic ulcer of left calf with fat layer exposed L97.221 Non-pressure chronic ulcer of left calf limited to breakdown of skin Facility Procedures CPT4 Code: [id number] Description: 85277 - WOUND CARE VISIT-LEV 2 EST PT Modifier: Quantity: 1 Physician Procedures CPT4 Code: [id number] Description: 99213 - WC PHYS LEVEL 3 - EST PT ICD-10 Diagnosis Description I89.0 Lymphedema, not elsewhere classified S81.812A Laceration without foreign body, left lower leg, initial enc L97.222 Non-pressure chronic ulcer of left calf with fat layer  expos L97.221 Non-pressure chronic ulcer of left calf limited to breakdown Modifier: ounter ed of skin Quantity: 1 Electronic Signature(s) Signed: 04/24/2017 3:59:23 PM By: Worthy Keeler PA-C Entered By: Worthy Keeler on 04/24/2017 10:21:04

## 2017-04-29 ENCOUNTER — Other Ambulatory Visit: Payer: Self-pay | Admitting: Interventional Cardiology

## 2017-05-08 ENCOUNTER — Other Ambulatory Visit: Payer: Self-pay | Admitting: Interventional Cardiology

## 2017-05-08 ENCOUNTER — Encounter: Payer: Medicare Other | Admitting: Physician Assistant

## 2017-05-08 DIAGNOSIS — E11622 Type 2 diabetes mellitus with other skin ulcer: Secondary | ICD-10-CM | POA: Diagnosis not present

## 2017-05-10 NOTE — Telephone Encounter (Signed)
Outpatient Medication Detail    Disp Refills Start End   hydrochlorothiazide (HYDRODIURIL) 12.5 MG tablet 30 tablet 0 05/01/2017    Sig - Route: Take 1 tablet (12.5 mg total) by mouth daily. - Oral   Sent to pharmacy as: hydrochlorothiazide (HYDRODIURIL) 12.5 MG tablet   Notes to Pharmacy: Please keep upcoming appointment for additional refills.   E-Prescribing Status: Receipt confirmed by pharmacy (05/01/2017 8:39 AM EDT)   Pharmacy   [place], [place] - [place]

## 2017-05-11 ENCOUNTER — Other Ambulatory Visit: Payer: Self-pay | Admitting: Interventional Cardiology

## 2017-05-11 NOTE — Progress Notes (Signed)
[name] ([id number]) Visit Report for 05/08/2017 Chief Complaint Document Details Patient Name: [name], [name]. Date of Service: 05/08/2017 9:30 AM Medical Record Number: [id number] Patient Account Number: [id number] Date of Birth/Sex: 02-Jun-1930 (84 y.o. M) Treating RN: Roger Shelter Primary Care Provider: Gaynelle Arabian Other Clinician: Referring Provider: Gaynelle Arabian Treating Provider/Extender: Melburn Hake, Meelah Tallo Weeks in Treatment: 27 Information Obtained from: Patient Chief Complaint He is seen for follow up evaluation of lle ulcers Electronic Signature(s) Signed: 05/09/2017 8:17:44 AM By: Worthy Keeler PA-C Entered By: Worthy Keeler on 05/08/2017 10:04:40 [name] ([id number]) -------------------------------------------------------------------------------- HPI Details Patient Name: [name] Date of Service: 05/08/2017 9:30 AM Medical Record Number: [id number] Patient Account Number: [id number] Date of Birth/Sex: February 10, 1930 (84 y.o. M) Treating RN: Roger Shelter Primary Care Provider: Gaynelle Arabian Other Clinician: Referring Provider: Gaynelle Arabian Treating Provider/Extender: Melburn Hake, Keanu Lesniak Weeks in Treatment: 19 History of Present Illness Location: left lower extremity swelling with ulceration on the posterior calf Modifying Factors: Other treatment(s) tried include:and seen his PCP who thought there was cellulitis and infection and put him on doxycycline for 2 weeks HPI Description: 84 year old gentleman has been referred to [place] by his PCP Dr. Gaynelle Arabian, for a ulcer on the left posterior lower leg which has been there for about 6 weeks. The patient was thought to have an infection and was put on doxycycline for 14 days and referred to the wound center. He has a past medical history of paroxysmal atrial fibrillation, hypertension, diverticulosis, status post right hip replacement, appendectomy. He has not been a smoker since 62. the  anterior shin wound has been more recent about 2 weeks and this was caused probably due to a blunt abrasion. 03/06/17 on evaluation today patient's ulcer appears to show signs of excellent improvement. He is definitely showing additional evidence of granulation which is great news. Overall I'm extremely pleased with the progress that he has been making from the beginning to where we are right now. With that being said I would like to potentially try different dressing to see if we could stimulate this to heal even faster. 03/13/17 on evaluation today patient appears to be doing little better in regard to his leg ulcer at this time. He has been tolerating the dressing changes without complication. There does not appear to be any evidence of infection which is good news. He does have some additional granulation observed at this point and I do believe that the silver collagen dressing has been of benefit in this regard. Overall I'm very pleased with the progress she's made since last visit the only downside is that he does have additional maceration due to drainage compared to previous we may need to do something to address this. 03/27/17 on evaluation today patient appears to be doing well in regard to his left posterior lower surety ulcer. He has a significant area of epithelial growth which seems to be doing excellent at this point in time. He also seems to be well controlled as far as slough is concerned and drainage there is no evidence of maceration and there really was no slough that could not be cleaned off with saline and gauze. Overall patient seems to be making excellent progress. 04/10/17 on evaluation today patient appears to be doing better in regard to the original ulcer for which we have been treating him. Unfortunately he has two areas where he sustained injury to his posterior leg more proximal to where the current ulceration is. This was due  to it appears to be tape and adhesive at this  point. When he was using the Allevyn dressing there was no problems however they ran out of these and since that point there seems to have been more irritation around the wound from the Band-Aids that his wife has been using in their place. Other than this which appear to be very superficial new ulcers everything appears to be progressing nicely. 04/24/17 on evaluation today patient appears to be doing excellent in regard to his left posterior lower extremity ulcer. He is been tolerating the dressing changes without complication fortunately there does not appear to be any evidence of infection at this point in time. Overall he has made significant improvements since I last saw him. 05/08/17 on evaluation today patient's wound appears to be completely healed at this point. Fortunately this is great news and though it is closed over with epithelium there still some healing to be done underneath for this completely toughen up this was explained to the patient and his wife today as well. Electronic Signature(s) Signed: 05/09/2017 8:17:44 AM By: Worthy Keeler PA-C Entered By: Worthy Keeler on 05/08/2017 10:15:14 [name] ([id number]) -------------------------------------------------------------------------------- Physical Exam Details Patient Name: [name] Date of Service: 05/08/2017 9:30 AM Medical Record Number: [id number] Patient Account Number: [id number] Date of Birth/Sex: 02/01/1930 (84 y.o. M) Treating RN: Roger Shelter Primary Care Provider: Gaynelle Arabian Other Clinician: Referring Provider: Gaynelle Arabian Treating Provider/Extender: STONE III, [place] Vanaken Weeks in Treatment: 9 Constitutional Well-nourished and well-hydrated in no acute distress. Psychiatric this patient is able to make decisions and demonstrates good insight into disease process. Alert and Oriented x 3. pleasant and cooperative. Notes Currently I'm very pleased with how things have gone and happy for  him that the wound appears to be completely healed without any complication at this point. The patient likewise is very pleased. Electronic Signature(s) Signed: 05/09/2017 8:17:44 AM By: Worthy Keeler PA-C Entered By: Worthy Keeler on 05/08/2017 10:15:47 [name] ([id number]) -------------------------------------------------------------------------------- Physician Orders Details Patient Name: [name] Date of Service: 05/08/2017 9:30 AM Medical Record Number: [id number] Patient Account Number: [id number] Date of Birth/Sex: 10/01/1930 (84 y.o. M) Treating RN: Roger Shelter Primary Care Provider: Gaynelle Arabian Other Clinician: Referring Provider: Gaynelle Arabian Treating Provider/Extender: Melburn Hake, Milen Lengacher Weeks in Treatment: 57 Verbal / Phone Orders: No Diagnosis Coding ICD-10 Coding Code Description I89.0 Lymphedema, not elsewhere classified S81.812A Laceration without foreign body, left lower leg, initial encounter L97.222 Non-pressure chronic ulcer of left calf with fat layer exposed L97.221 Non-pressure chronic ulcer of left calf limited to breakdown of skin Wound Cleansing o Clean wound with Normal Saline. Anesthetic (add to Medication List) o Topical Lidocaine 4% cream applied to wound bed prior to debridement (In Clinic Only). Skin Barriers/Peri-Wound Care o Skin Prep Discharge From [facility] o Discharge from [name] - treatment completed Notes coverlet for protection Electronic Signature(s) Signed: 05/08/2017 5:23:19 PM By: Roger Shelter Signed: 05/09/2017 8:17:44 AM By: Worthy Keeler PA-C Entered By: Roger Shelter on 05/08/2017 10:10:35 [name] ([id number]) -------------------------------------------------------------------------------- Problem List Details Patient Name: KHOLTON, COATE. Date of Service: 05/08/2017 9:30 AM Medical Record Number: [id number] Patient Account Number: [id number] Date of Birth/Sex:  1930-06-24 (84 y.o. M) Treating RN: Roger Shelter Primary Care Provider: Gaynelle Arabian Other Clinician: Referring Provider: Gaynelle Arabian Treating Provider/Extender: Melburn Hake, Autie Vasudevan Weeks in Treatment: 52 Active Problems ICD-10 Impacting Encounter Code Description Active Date Wound Healing Diagnosis I89.0 Lymphedema, not elsewhere classified  12/22/2016 Yes S81.812A Laceration without foreign body, left lower leg, initial 12/22/2016 Yes encounter L97.222 Non-pressure chronic ulcer of left calf with fat layer exposed 12/22/2016 Yes L97.221 Non-pressure chronic ulcer of left calf limited to breakdown of 12/22/2016 Yes skin Inactive Problems Resolved Problems Electronic Signature(s) Signed: 05/09/2017 8:17:44 AM By: Worthy Keeler PA-C Entered By: Worthy Keeler on 05/08/2017 10:04:33 [name] ([id number]) -------------------------------------------------------------------------------- Progress Note Details Patient Name: [name] Date of Service: 05/08/2017 9:30 AM Medical Record Number: [id number] Patient Account Number: [id number] Date of Birth/Sex: 1930-03-16 (84 y.o. M) Treating RN: Roger Shelter Primary Care Provider: Gaynelle Arabian Other Clinician: Referring Provider: Gaynelle Arabian Treating Provider/Extender: Melburn Hake, Nimah Uphoff Weeks in Treatment: 21 Subjective Chief Complaint Information obtained from Patient He is seen for follow up evaluation of lle ulcers History of Present Illness (HPI) The following HPI elements were documented for the patient's wound: Location: left lower extremity swelling with ulceration on the posterior calf Modifying Factors: Other treatment(s) tried include:and seen his PCP who thought there was cellulitis and infection and put him on doxycycline for 82 weeks 84 year old gentleman has been referred to [place] by his PCP Dr. Gaynelle Arabian, for a ulcer on the left posterior lower leg which has been there for about 6 weeks. The  patient was thought to have an infection and was put on doxycycline for 14 days and referred to the wound center. He has a past medical history of paroxysmal atrial fibrillation, hypertension, diverticulosis, status post right hip replacement, appendectomy. He has not been a smoker since 84. the anterior shin wound has been more recent about 2 weeks and this was caused probably due to a blunt abrasion. 03/06/17 on evaluation today patient's ulcer appears to show signs of excellent improvement. He is definitely showing additional evidence of granulation which is great news. Overall I'm extremely pleased with the progress that he has been making from the beginning to where we are right now. With that being said I would like to potentially try different dressing to see if we could stimulate this to heal even faster. 03/13/17 on evaluation today patient appears to be doing little better in regard to his leg ulcer at this time. He has been tolerating the dressing changes without complication. There does not appear to be any evidence of infection which is good news. He does have some additional granulation observed at this point and I do believe that the silver collagen dressing has been of benefit in this regard. Overall I'm very pleased with the progress she's made since last visit the only downside is that he does have additional maceration due to drainage compared to previous we may need to do something to address this. 03/27/17 on evaluation today patient appears to be doing well in regard to his left posterior lower surety ulcer. He has a significant area of epithelial growth which seems to be doing excellent at this point in time. He also seems to be well controlled as far as slough is concerned and drainage there is no evidence of maceration and there really was no slough that could not be cleaned off with saline and gauze. Overall patient seems to be making excellent progress. 04/10/17 on evaluation  today patient appears to be doing better in regard to the original ulcer for which we have been treating him. Unfortunately he has two areas where he sustained injury to his posterior leg more proximal to where the current ulceration is. This was due to it appears to be  tape and adhesive at this point. When he was using the Allevyn dressing there was no problems however they ran out of these and since that point there seems to have been more irritation around the wound from the Band-Aids that his wife has been using in their place. Other than this which appear to be very superficial new ulcers everything appears to be progressing nicely. 04/24/17 on evaluation today patient appears to be doing excellent in regard to his left posterior lower extremity ulcer. He is been tolerating the dressing changes without complication fortunately there does not appear to be any evidence of infection at this point in time. Overall he has made significant improvements since I last saw him. 05/08/17 on evaluation today patient's wound appears to be completely healed at this point. Fortunately this is great news and though it is closed over with epithelium there still some healing to be done underneath for this completely toughen up this was explained to the patient and his wife today as well. KHAI, ARRONA ([id number]) Patient History Information obtained from Patient. Family History Diabetes - Child, Hypertension - Child, No family history of Cancer, Heart Disease, Kidney Disease, Lung Disease, Seizures, Stroke, Thyroid Problems, Tuberculosis. Social History Former smoker - 22 years +, Marital Status - Married, Alcohol Use - Never, Drug Use - No History, Caffeine Use - Rarely. Review of Systems (ROS) Constitutional Symptoms (General Health) Denies complaints or symptoms of Fever, Chills. Psychiatric The patient has no complaints or symptoms. Objective Constitutional Well-nourished and well-hydrated in no  acute distress. Vitals Time Taken: 9:52 AM, Height: 72 in, Weight: 217 lbs, BMI: 29.4, Temperature: 97.9 F, Pulse: 100 bpm, Respiratory Rate: 18 breaths/min, Blood Pressure: 145/68 mmHg. Psychiatric this patient is able to make decisions and demonstrates good insight into disease process. Alert and Oriented x 3. pleasant and cooperative. General Notes: Currently I'm very pleased with how things have gone and happy for him that the wound appears to be completely healed without any complication at this point. The patient likewise is very pleased. Integumentary (Hair, Skin) Wound #1 status is Healed - Epithelialized. Original cause of wound was Gradually Appeared. The wound is located on the Left,Midline,Posterior Lower Leg. The wound measures 0cm length x 0cm width x 0cm depth; 0cm^2 area and 0cm^3 volume. There is Fat Layer (Subcutaneous Tissue) Exposed exposed. There is no tunneling or undermining noted. There is a none present amount of drainage noted. The wound margin is distinct with the outline attached to the wound base. There is large (67-100%) red, hyper - granulation within the wound bed. There is no necrotic tissue within the wound bed. The periwound skin appearance exhibited: Induration, Scarring. The periwound skin appearance did not exhibit: Callus, Crepitus, Excoriation, Rash, Dry/Scaly, Maceration, Atrophie Blanche, Cyanosis, Ecchymosis, Hemosiderin Staining, Mottled, Pallor, Rubor, Erythema. Periwound temperature was noted as No Abnormality. The periwound has tenderness on palpation. TRUMAINE, WIMER ([id number]) Assessment Active Problems ICD-10 I89.0 - Lymphedema, not elsewhere classified S81.812A - Laceration without foreign body, left lower leg, initial encounter L97.222 - Non-pressure chronic ulcer of left calf with fat layer exposed L97.221 - Non-pressure chronic ulcer of left calf limited to breakdown of skin Plan Wound Cleansing: Clean wound with Normal  Saline. Anesthetic (add to Medication List): Topical Lidocaine 4% cream applied to wound bed prior to debridement (In Clinic Only). Skin Barriers/Peri-Wound Care: Skin Prep Discharge From [facility] Services: Discharge from [place] - treatment completed General Notes: coverlet for protection We will see him for reevaluation in  the future as needed if anything changes or worsens. Hopefully that will not be the case. Nonetheless she does have any concerns he can always contact our office both he and his wife voiced understanding. Otherwise will discontinue wound care services at this point. Electronic Signature(s) Signed: 05/09/2017 8:17:44 AM By: Worthy Keeler PA-C Entered By: Worthy Keeler on 05/08/2017 10:16:10 [name] ([id number]) -------------------------------------------------------------------------------- ROS/PFSH Details Patient Name: [name] Date of Service: 05/08/2017 9:30 AM Medical Record Number: [id number] Patient Account Number: [id number] Date of Birth/Sex: [birth year] (84 y.o. M) Treating RN: Roger Shelter Primary Care Provider: Gaynelle Arabian Other Clinician: Referring Provider: Gaynelle Arabian Treating Provider/Extender: Melburn Hake, Wanell Lorenzi Weeks in Treatment: 45 Information Obtained From Patient Wound History Do you currently have one or more open woundso Yes How many open wounds do you currently haveo 2 Approximately how long have you had your woundso 4 weeks How have you been treating your wound(s) until nowo neosporin Has your wound(s) ever healed and then re-openedo No Have you had any lab work done in the past montho No Have you tested positive for an antibiotic resistant organism (MRSA, VRE)o No Have you tested positive for osteomyelitis (bone infection)o No Have you had any tests for circulation on your legso No Constitutional Symptoms (General Health) Complaints and Symptoms: Negative for: Fever; Chills Eyes Medical  History: Negative for: Cataracts; Glaucoma; Optic Neuritis Ear/Nose/Mouth/Throat Medical History: Negative for: Chronic sinus problems/congestion; Middle ear problems Hematologic/Lymphatic Medical History: Negative for: Anemia; Hemophilia; Human Immunodeficiency Virus; Lymphedema; Sickle Cell Disease Respiratory Medical History: Negative for: Aspiration; Asthma; Chronic Obstructive Pulmonary Disease (COPD); Pneumothorax; Sleep Apnea; Tuberculosis Cardiovascular Medical History: Positive for: Arrhythmia - A- Fib Negative for: Angina; Congestive Heart Failure; Coronary Artery Disease; Deep Vein Thrombosis; Hypertension; Hypotension; Myocardial Infarction; Peripheral Arterial Disease; Peripheral Venous Disease; Phlebitis; Vasculitis Gastrointestinal ACHILLES, NEVILLE ([id number]) Medical History: Negative for: Cirrhosis ; Colitis; Crohnos; Hepatitis A; Hepatitis B; Hepatitis C Endocrine Medical History: Negative for: Type I Diabetes; Type II Diabetes Genitourinary Medical History: Negative for: End Stage Renal Disease Immunological Medical History: Negative for: Lupus Erythematosus; Raynaudos Integumentary (Skin) Medical History: Negative for: History of Burn; History of pressure wounds Musculoskeletal Medical History: Negative for: Gout; Rheumatoid Arthritis; Osteoarthritis; Osteomyelitis Neurologic Medical History: Negative for: Dementia; Neuropathy; Quadriplegia; Paraplegia; Seizure Disorder Psychiatric Complaints and Symptoms: No Complaints or Symptoms Medical History: Negative for: Anorexia/bulimia; Confinement Anxiety Immunizations Pneumococcal Vaccine: Received Pneumococcal Vaccination: No Implantable Devices Family and Social History Cancer: No; Diabetes: Yes - Child; Heart Disease: No; Hypertension: Yes - Child; Kidney Disease: No; Lung Disease: No; Seizures: No; Stroke: No; Thyroid Problems: No; Tuberculosis: No; Former smoker - 76 years +; Marital Status -  Married; Alcohol Use: Never; Drug Use: No History; Caffeine Use: Rarely; Financial Concerns: No; Food, Clothing or Shelter Needs: No; Support System Lacking: No; Transportation Concerns: No; Advanced Directives: No; Patient does not want information on Advanced Directives; Do not resuscitate: No; Living Will: No; Medical Power of Attorney: No Physician Affirmation I have reviewed and agree with the above information. Electronic Signature(s) KENROY, TIMBERMAN ([id number]) Signed: 05/08/2017 5:23:19 PM By: Roger Shelter Signed: 05/09/2017 8:17:44 AM By: Worthy Keeler PA-C Entered By: Worthy Keeler on 05/08/2017 10:15:29 DUKE, WEISENSEL ([id number]) -------------------------------------------------------------------------------- SuperBill Details Patient Name: [name] Date of Service: 05/08/2017 Medical Record Number: [id number] Patient Account Number: [id number] Date of Birth/Sex: [birth year] (84 y.o. M) Treating RN: Roger Shelter Primary Care Provider: Gaynelle Arabian Other Clinician: Referring Provider: Gaynelle Arabian Treating Provider/Extender: Joaquim Lai  III, Aleisa Howk Weeks in Treatment: 19 Diagnosis Coding ICD-10 Codes Code Description I89.0 Lymphedema, not elsewhere classified S81.812A Laceration without foreign body, left lower leg, initial encounter L97.222 Non-pressure chronic ulcer of left calf with fat layer exposed L97.221 Non-pressure chronic ulcer of left calf limited to breakdown of skin Facility Procedures CPT4 Code: [id number] Description: [id number] - WOUND CARE VISIT-LEV 2 EST PT Modifier: Quantity: 1 Physician Procedures CPT4 Code: [id number] Description: [id number] - WC PHYS LEVEL 2 - EST PT ICD-10 Diagnosis Description I89.0 Lymphedema, not elsewhere classified S81.812A Laceration without foreign body, left lower leg, initial enc L97.222 Non-pressure chronic ulcer of left calf with fat layer  expos L97.221 Non-pressure chronic ulcer of left calf limited to  breakdown Modifier: ounter ed of skin Quantity: 1 Electronic Signature(s) Signed: 05/09/2017 8:17:44 AM By: Worthy Keeler PA-C Entered By: Worthy Keeler on 05/08/2017 10:16:59

## 2017-05-11 NOTE — Addendum Note (Signed)
Addended by: Juventino Slovak on: 05/11/2017 12:25 PM   Modules accepted: Orders

## 2017-05-13 NOTE — Progress Notes (Signed)
[name] ([id number]) Visit Report for 05/08/2017 Arrival Information Details Patient Name: [name], [name]. Date of Service: 05/08/2017 9:30 AM Medical Record Number: [id number] Patient Account Number: [id number] Date of Birth/Sex: 11/06/1930 (84 y.o. M) Treating RN: Ahmed Prima Primary Care Anisa Leanos: Gaynelle Arabian Other Clinician: Referring Anaise Sterbenz: Gaynelle Arabian Treating Yarelli Decelles/Extender: Melburn Hake, HOYT Weeks in Treatment: 45 Visit Information History Since Last Visit All ordered tests and consults were completed: No Patient Arrived: Ambulatory Added or deleted any medications: No Arrival Time: 09:52 Any new allergies or adverse reactions: No Accompanied By: self Had a fall or experienced change in No Transfer Assistance: None activities of daily living that may affect Patient Identification Verified: Yes risk of falls: Secondary Verification Process Completed: Yes Signs or symptoms of abuse/neglect since last visito No Patient Requires Transmission-Based No Hospitalized since last visit: No Precautions: Implantable device outside of the clinic excluding No Patient Has Alerts: No cellular tissue based products placed in the center since last visit: Has Dressing in Place as Prescribed: Yes Pain Present Now: No Electronic Signature(s) Signed: 05/10/2017 4:27:21 PM By: Alric Quan Entered By: Alric Quan on 05/08/2017 09:52:20 [name] ([id number]) -------------------------------------------------------------------------------- Clinic Level of Care Assessment Details Patient Name: [name] Date of Service: 05/08/2017 9:30 AM Medical Record Number: [id number] Patient Account Number: [id number] Date of Birth/Sex: Jan 30, 1930 (84 y.o. M) Treating RN: Roger Shelter Primary Care Abayomi Pattison: Gaynelle Arabian Other Clinician: Referring Betzalel Umbarger: Gaynelle Arabian Treating Fue Cervenka/Extender: Melburn Hake, HOYT Weeks in Treatment: 19 Clinic Level of  Care Assessment Items TOOL 4 Quantity Score []  - Use when only an EandM is performed on FOLLOW-UP visit 0 ASSESSMENTS - Nursing Assessment / Reassessment X - Reassessment of Co-morbidities (includes updates in patient status) 1 10 X- 1 5 Reassessment of Adherence to Treatment Plan ASSESSMENTS - Wound and Skin Assessment / Reassessment X - Simple Wound Assessment / Reassessment - one wound 1 5 []  - 0 Complex Wound Assessment / Reassessment - multiple wounds []  - 0 Dermatologic / Skin Assessment (not related to wound area) ASSESSMENTS - Focused Assessment []  - Circumferential Edema Measurements - multi extremities 0 []  - 0 Nutritional Assessment / Counseling / Intervention []  - 0 Lower Extremity Assessment (monofilament, tuning fork, pulses) []  - 0 Peripheral Arterial Disease Assessment (using hand held doppler) ASSESSMENTS - Ostomy and/or Continence Assessment and Care []  - Incontinence Assessment and Management 0 []  - 0 Ostomy Care Assessment and Management (repouching, etc.) PROCESS - Coordination of Care X - Simple Patient / Family Education for ongoing care 1 15 []  - 0 Complex (extensive) Patient / Family Education for ongoing care []  - 0 Staff obtains Programmer, systems, Records, Test Results / Process Orders []  - 0 Staff telephones HHA, Nursing Homes / Clarify orders / etc []  - 0 Routine Transfer to another Facility (non-emergent condition) []  - 0 Routine Hospital Admission (non-emergent condition) []  - 0 New Admissions / Biomedical engineer / Ordering NPWT, Apligraf, etc. []  - 0 Emergency Hospital Admission (emergent condition) X- 1 10 Simple Discharge Coordination [name] ([id number]) []  - 0 Complex (extensive) Discharge Coordination PROCESS - Special Needs []  - Pediatric / Minor Patient Management 0 []  - 0 Isolation Patient Management []  - 0 Hearing / Language / Visual special needs []  - 0 Assessment of Community assistance (transportation, D/C planning,  etc.) []  - 0 Additional assistance / Altered mentation []  - 0 Support Surface(s) Assessment (bed, cushion, seat, etc.) INTERVENTIONS - Wound Cleansing / Measurement X - Simple Wound Cleansing - one  wound 1 5 []  - 0 Complex Wound Cleansing - multiple wounds X- 1 5 Wound Imaging (photographs - any number of wounds) []  - 0 Wound Tracing (instead of photographs) X- 1 5 Simple Wound Measurement - one wound []  - 0 Complex Wound Measurement - multiple wounds INTERVENTIONS - Wound Dressings []  - Small Wound Dressing one or multiple wounds 0 []  - 0 Medium Wound Dressing one or multiple wounds []  - 0 Large Wound Dressing one or multiple wounds []  - 0 Application of Medications - topical []  - 0 Application of Medications - injection INTERVENTIONS - Miscellaneous []  - External ear exam 0 []  - 0 Specimen Collection (cultures, biopsies, blood, body fluids, etc.) []  - 0 Specimen(s) / Culture(s) sent or taken to Lab for analysis []  - 0 Patient Transfer (multiple staff / Civil Service fast streamer / Similar devices) []  - 0 Simple Staple / Suture removal (25 or less) []  - 0 Complex Staple / Suture removal (26 or more) []  - 0 Hypo / Hyperglycemic Management (close monitor of Blood Glucose) []  - 0 Ankle / Brachial Index (ABI) - do not check if billed separately X- 1 5 Vital Signs [name], [name] ([id number]) Has the patient been seen at the hospital within the last three years: Yes Total Score: 65 Level Of Care: New/Established - Level 2 Electronic Signature(s) Signed: 05/08/2017 5:23:19 PM By: Roger Shelter Entered By: Roger Shelter on 05/08/2017 10:13:38 [name] ([id number]) -------------------------------------------------------------------------------- Encounter Discharge Information Details Patient Name: [name] Date of Service: 05/08/2017 9:30 AM Medical Record Number: [id number] Patient Account Number: [id number] Date of Birth/Sex: 08-24-1930 (84 y.o. M) Treating RN:  Roger Shelter Primary Care Andron Marrazzo: Gaynelle Arabian Other Clinician: Referring Kevyn Boquet: Gaynelle Arabian Treating Mickal Meno/Extender: Melburn Hake, HOYT Weeks in Treatment: 65 Encounter Discharge Information Items Discharge Pain Level: 0 Discharge Condition: Stable Ambulatory Status: Ambulatory Discharge Destination: Home Transportation: Private Auto Accompanied By: wife Schedule Follow-up Appointment: Yes Medication Reconciliation completed and No provided to Patient/Care Larenda Reedy: Provided on Clinical Summary of Care: 05/08/2017 Form Type Recipient Paper Patient AN Electronic Signature(s) Signed: 05/10/2017 8:52:06 AM By: Ruthine Dose Entered By: Ruthine Dose on 05/08/2017 10:16:31 [name] ([id number]) -------------------------------------------------------------------------------- Lower Extremity Assessment Details Patient Name: [name] Date of Service: 05/08/2017 9:30 AM Medical Record Number: [id number] Patient Account Number: [id number] Date of Birth/Sex: February 03, 1930 (84 y.o. M) Treating RN: Ahmed Prima Primary Care Ronasia Isola: Gaynelle Arabian Other Clinician: Referring Patirica Longshore: Gaynelle Arabian Treating Taleeyah Bora/Extender: Melburn Hake, HOYT Weeks in Treatment: 36 Vascular Assessment Pulses: Dorsalis Pedis Palpable: [Left:Yes] Posterior Tibial Extremity colors, hair growth, and conditions: Extremity Color: [Left:Hyperpigmented] Temperature of Extremity: [Left:Warm] Capillary Refill: [Left:< 3 seconds] Toe Nail Assessment Left: Right: Thick: Yes Discolored: Yes Deformed: Yes Improper Length and Hygiene: Yes Electronic Signature(s) Signed: 05/10/2017 4:27:21 PM By: Alric Quan Entered By: Alric Quan on 05/08/2017 09:57:52 Vannice, Jeoffrey Massed ([id number]) -------------------------------------------------------------------------------- Multi Wound Chart Details Patient Name: [name] Date of Service: 05/08/2017 9:30 AM Medical Record  Number: [id number] Patient Account Number: [id number] Date of Birth/Sex: 12/31/1930 (84 y.o. M) Treating RN: Roger Shelter Primary Care Kristoffer Bala: Gaynelle Arabian Other Clinician: Referring Breia Ocampo: Gaynelle Arabian Treating Sahaj Bona/Extender: Melburn Hake, HOYT Weeks in Treatment: 19 Vital Signs Height(in): 72 Pulse(bpm): 100 Weight(lbs): 217 Blood Pressure(mmHg): 145/68 Body Mass Index(BMI): 29 Temperature(F): 97.9 Respiratory Rate 18 (breaths/min): Photos: [1:No Photos] [N/A:N/A] Wound Location: [1:Left Lower Leg - Midline, Posterior] [N/A:N/A] Wounding Event: [1:Gradually Appeared] [N/A:N/A] Primary Etiology: [1:Venous Leg Ulcer] [N/A:N/A] Comorbid History: [1:Arrhythmia] [N/A:N/A] Date Acquired: [1:12/01/2016] [N/A:N/A] Weeks  of Treatment: [1:19] [N/A:N/A] Wound Status: [1:Open] [N/A:N/A] Measurements L x W x D [1:0.1x0.1x0.1] [N/A:N/A] (cm) Area (cm) : [2:7.062] [N/A:N/A] Volume (cm) : [1:0.001] [N/A:N/A] % Reduction in Area: [1:99.30%] [N/A:N/A] % Reduction in Volume: [1:99.20%] [N/A:N/A] Classification: [1:Full Thickness Without Exposed Support Structures] [N/A:N/A] Exudate Amount: [1:None Present] [N/A:N/A] Wound Margin: [1:Distinct, outline attached] [N/A:N/A] Granulation Amount: [1:Large (67-100%)] [N/A:N/A] Granulation Quality: [1:Red, Hyper-granulation] [N/A:N/A] Necrotic Amount: [1:None Present (0%)] [N/A:N/A] Exposed Structures: [1:Fat Layer (Subcutaneous Tissue) Exposed: Yes] [N/A:N/A] Epithelialization: [1:Large (67-100%)] [N/A:N/A] Periwound Skin Texture: [1:Induration: Yes Scarring: Yes Excoriation: No Callus: No Crepitus: No Rash: No] [N/A:N/A] Periwound Skin Moisture: [1:Maceration: No Dry/Scaly: No] [N/A:N/A] Periwound Skin Color: [1:Atrophie Blanche: No Cyanosis: No Ecchymosis: No Erythema: No] [N/A:N/A] Hemosiderin Staining: No Mottled: No Pallor: No Rubor: No Temperature: No Abnormality N/A N/A Tenderness on Palpation: Yes N/A N/A Wound  Preparation: Ulcer Cleansing: N/A N/A Rinsed/Irrigated with Saline Topical Anesthetic Applied: None Treatment Notes Electronic Signature(s) Signed: 05/08/2017 5:23:19 PM By: Roger Shelter Entered By: Roger Shelter on 05/08/2017 10:08:43 [name] ([id number]) -------------------------------------------------------------------------------- [place] Details Patient Name: [name] Date of Service: 05/08/2017 9:30 AM Medical Record Number: [id number] Patient Account Number: [id number] Date of Birth/Sex: 1930-11-20 (84 y.o. M) Treating RN: Roger Shelter Primary Care Makaylen Thieme: Gaynelle Arabian Other Clinician: Referring Cord Wilczynski: Gaynelle Arabian Treating Colie Fugitt/Extender: Melburn Hake, HOYT Weeks in Treatment: 1 Active Inactive Electronic Signature(s) Signed: 05/08/2017 5:23:19 PM By: Roger Shelter Entered By: Roger Shelter on 05/08/2017 10:18:05 [name] ([id number]) -------------------------------------------------------------------------------- Pain Assessment Details Patient Name: [name] Date of Service: 05/08/2017 9:30 AM Medical Record Number: [id number] Patient Account Number: [id number] Date of Birth/Sex: 05-25-1930 (84 y.o. M) Treating RN: Ahmed Prima Primary Care Laquita Harlan: Gaynelle Arabian Other Clinician: Referring Joclynn Lumb: Gaynelle Arabian Treating Sheela Mcculley/Extender: Melburn Hake, HOYT Weeks in Treatment: 30 Active Problems Location of Pain Severity and Description of Pain Patient Has Paino No Site Locations Pain Management and Medication Current Pain Management: Electronic Signature(s) Signed: 05/10/2017 4:27:21 PM By: Alric Quan Entered By: Alric Quan on 05/08/2017 09:52:26 [name] ([id number]) -------------------------------------------------------------------------------- Patient/Caregiver Education Details Patient Name: [name] Date of Service: 05/08/2017 9:30 AM Medical Record  Number: [id number] Patient Account Number: [id number] Date of Birth/Gender: 25-Mar-1930 (84 y.o. M) Treating RN: Roger Shelter Primary Care Physician: Gaynelle Arabian Other Clinician: Referring Physician: Gaynelle Arabian Treating Physician/Extender: Sharalyn Ink in Treatment: 43 Education Assessment Education Provided To: Patient Education Topics Provided Wound/Skin Impairment: Handouts: Skin Care Do's and Dont's Methods: Explain/Verbal Responses: State content correctly Electronic Signature(s) Signed: 05/08/2017 5:23:19 PM By: Roger Shelter Entered By: Roger Shelter on 05/08/2017 10:15:37 [name] ([id number]) -------------------------------------------------------------------------------- Wound Assessment Details Patient Name: [name] Date of Service: 05/08/2017 9:30 AM Medical Record Number: [id number] Patient Account Number: [id number] Date of Birth/Sex: 06-06-1930 (84 y.o. M) Treating RN: Roger Shelter Primary Care Mileidy Atkin: Gaynelle Arabian Other Clinician: Referring Kallista Pae: Gaynelle Arabian Treating Caelan Branden/Extender: STONE III, HOYT Weeks in Treatment: 19 Wound Status Wound Number: 1 Primary Etiology: Venous Leg Ulcer Wound Location: Left, Midline, Posterior Lower Leg Wound Status: Healed - Epithelialized Wounding Event: Gradually Appeared Comorbid History: Arrhythmia Date Acquired: 12/01/2016 Weeks Of Treatment: 19 Clustered Wound: No Wound Measurements Length: (cm) 0 % R Width: (cm) 0 % R Depth: (cm) 0 Epi Area: (cm) 0 Tu Volume: (cm) 0 Un eduction in Area: 100% eduction in Volume: 100% thelialization: Large (67-100%) nneling: No dermining: No Wound Description Full Thickness Without Exposed Support Classification: Structures Wound Margin: Distinct, outline attached Exudate None  Present Amount: Foul Odor After Cleansing: No Slough/Fibrino No Wound Bed Granulation Amount: Large (67-100%) Exposed  Structure Granulation Quality: Red, Hyper-granulation Fat Layer (Subcutaneous Tissue) Exposed: Yes Necrotic Amount: None Present (0%) Periwound Skin Texture Texture Color No Abnormalities Noted: No No Abnormalities Noted: No Callus: No Atrophie Blanche: No Crepitus: No Cyanosis: No Excoriation: No Ecchymosis: No Induration: Yes Erythema: No Rash: No Hemosiderin Staining: No Scarring: Yes Mottled: No Pallor: No Moisture Rubor: No No Abnormalities Noted: No Dry / Scaly: No Temperature / Pain Maceration: No Temperature: No Abnormality Tenderness on Palpation: Yes Wound Preparation Ulcer Cleansing: Rinsed/Irrigated with Saline Topical Anesthetic Applied: None Canales, Rami J. ([id number]) Treatment Notes Wound #1 (Left, Midline, Posterior Lower Leg) 1. Cleansed with: Clean wound with Normal Saline 2. Anesthetic Topical Lidocaine 4% cream to wound bed prior to debridement 3. Peri-wound Care: Skin Prep Notes coverlet Electronic Signature(s) Signed: 05/08/2017 5:23:19 PM By: Roger Shelter Entered By: Roger Shelter on 05/08/2017 10:09:23 [name] ([id number]) -------------------------------------------------------------------------------- Vitals Details Patient Name: [name] Date of Service: 05/08/2017 9:30 AM Medical Record Number: [id number] Patient Account Number: [id number] Date of Birth/Sex: February 20, 1930 (84 y.o. M) Treating RN: Ahmed Prima Primary Care Aries Kasa: Gaynelle Arabian Other Clinician: Referring Deby Adger: Gaynelle Arabian Treating Leibish Mcgregor/Extender: Melburn Hake, HOYT Weeks in Treatment: 19 Vital Signs Time Taken: 09:52 Temperature (F): 97.9 Height (in): 72 Pulse (bpm): 100 Weight (lbs): 217 Respiratory Rate (breaths/min): 18 Body Mass Index (BMI): 29.4 Blood Pressure (mmHg): 145/68 Reference Range: 80 - 120 mg / dl Electronic Signature(s) Signed: 05/10/2017 4:27:21 PM By: Alric Quan Entered By: Alric Quan on  05/08/2017 09:54:20

## 2017-05-15 NOTE — Progress Notes (Signed)
Cardiology Office Note   Date:  05/17/2017   ID:  [name], [name] 12-13-1930, MRN [id number]  PCP:  Gaynelle Arabian, MD  Cardiologist:  Dr. Tamala Julian     Chief Complaint  Patient presents with  . Atrial Fibrillation      History of Present Illness: [name] is a 84 y.o. male who presents for atrial fib.    Last seen by Dr. Tamala Julian 05/02/16   She has a hx of chronic atrial fibrillation, long-term anticoagulation therapy, and history of embolic CVA. He also has essential hypertension.  Today no chest pain or SOB.  He has no bleeding in stools or urine.  He mows his grass week eats and helps with housekeeping.  He and his wife have been married 52 years.  He feels well. Did have a cold last week but no health issues since he last saw Dr. Tamala Julian.    Past Medical History:  Diagnosis Date  . Atrial fibrillation ([place]) 10/15/2012  . Embolic stroke ([place]) 34/74/2595  . Essential hypertension 01/21/2014  . Long term current use of anticoagulant therapy 12/25/2012    History reviewed. No pertinent surgical history.   Current Outpatient Medications  Medication Sig Dispense Refill  . atorvastatin (LIPITOR) 20 MG tablet Take 20 mg by mouth daily.    . calcium carbonate (OS-CAL) 600 MG TABS tablet Take 600 mg by mouth daily.     . cephALEXin (KEFLEX) 500 MG capsule Take 500 mg by mouth 3 (three) times daily.    . finasteride (PROSCAR) 5 MG tablet Take 5 mg by mouth daily.    . Glucosamine HCl (GLUCOSAMINE PO) Take 1,500 mg by mouth daily.     . hydrochlorothiazide (HYDRODIURIL) 12.5 MG tablet Take 1 tablet (12.5 mg total) by mouth daily. 30 tablet 0  . lisinopril (PRINIVIL,ZESTRIL) 10 MG tablet Take 10 mg by mouth daily.    . metoprolol succinate (TOPROL-XL) 50 MG 24 hr tablet Take 1 tablet (50 mg total) by mouth daily. Please keep upcoming appointment for additional refills. 30 tablet 0  . Multiple Vitamin (MULTIVITAMIN) tablet Take 1 tablet by mouth daily.    . Multiple  Vitamins-Minerals (PRESERVISION AREDS 2) CAPS Take 1 capsule by mouth 2 (two) times daily.    . tamsulosin (FLOMAX) 0.4 MG CAPS capsule Take 0.4 mg by mouth daily.     Marland Kitchen warfarin (COUMADIN) 5 MG tablet TAKE AS DIRECTED BY ANTICOAGULATION CLINIC 120 tablet 1   No current facility-administered medications for this visit.     Allergies:   Patient has no known allergies.    Social History:  The patient  reports that he has quit smoking. He has never used smokeless tobacco. He reports that he does not drink alcohol or use drugs.   Family History:  The patient's family history includes Other in his father and mother.  Mother with cerebral hemorrhage.   ROS:  General:+ recent cold no fevers, no weight changes Skin:no rashes or ulcers HEENT:no blurred vision, no congestion CV:see HPI PUL:see HPI GI:no diarrhea constipation or melena, no indigestion GU:no hematuria, no dysuria MS:no joint pain, no claudication, + ulcer, has been to wound clinic.now healed Neuro:no syncope, no lightheadedness Endo:no diabetes, no thyroid disease  Wt Readings from Last 3 Encounters:  05/17/17 209 lb 4 oz (94.9 kg)  05/02/16 216 lb 9.6 oz (98.2 kg)  10/22/15 202 lb (91.6 kg)     PHYSICAL EXAM: VS:  BP 116/76   Pulse 76   Ht  6' (1.829 m)   Wt 209 lb 4 oz (94.9 kg)   SpO2 97%   BMI 28.38 kg/m  , BMI Body mass index is 28.38 kg/m. General:Pleasant affect, NAD Skin:Warm and dry, brisk capillary refill HEENT:normocephalic, sclera clear, mucus membranes moist Neck:supple, no JVD, no bruits  Heart:irreg irreg,  without murmur, gallup, rub or click Lungs:clear without rales, rhonchi, or wheezes UQJ:FHLK, non tender, + BS, do not palpate liver spleen or masses Ext:no lower ext edema, wears support stockings. 1+ pedal pulses, 2+ radial pulses Neuro:alert and oriented X 3, MAE, follows commands, + facial symmetry    EKG:  EKG is ordered today. The ekg ordered today demonstrates atrial fib, rate 101. No  acute changes.     Recent Labs: No results found for requested labs within last 8760 hours.    Lipid Panel No results found for: CHOL, TRIG, HDL, CHOLHDL, VLDL, LDLCALC, LDLDIRECT  last lipieds 10/10/16 with LDL 89 and HDL 31, TC 152, TG 159    Other studies Reviewed: Additional studies/ records that were reviewed today include: . Cardiac cath 2008 Widely patent coronary arteries with minimal plaquing, normal LV function.    ASSESSMENT AND PLAN:  1.   Permanent a fib mildly elevated today but recovering from cold.  On coumadin for anticoagulation follow up with Dr. Tamala Julian in 10 months.   2.   Anticoagulation followed in our coumadin clinic. Stable , no bleeding complaints.   3.   Hx of embolic stroke  4.   HLD on lipitor per PCP.    5.   HTN controlled   Current medicines are reviewed with the patient today.  The patient Has no concerns regarding medicines.  The following changes have been made:  See above Labs/ tests ordered today include:see above  Disposition:   FU:  see above  Signed, Cecilie Kicks, NP  05/17/2017 9:41 AM    [address] [place], [place], [place], [place] Phone: [phone]; Fax: [phone]

## 2017-05-17 ENCOUNTER — Encounter: Payer: Self-pay | Admitting: Cardiology

## 2017-05-17 ENCOUNTER — Ambulatory Visit (INDEPENDENT_AMBULATORY_CARE_PROVIDER_SITE_OTHER): Payer: Medicare Other | Admitting: Cardiology

## 2017-05-17 VITALS — BP 116/76 | HR 76 | Ht 72.0 in | Wt 209.2 lb

## 2017-05-17 DIAGNOSIS — I10 Essential (primary) hypertension: Secondary | ICD-10-CM | POA: Diagnosis not present

## 2017-05-17 DIAGNOSIS — E782 Mixed hyperlipidemia: Secondary | ICD-10-CM

## 2017-05-17 DIAGNOSIS — I4891 Unspecified atrial fibrillation: Secondary | ICD-10-CM

## 2017-05-17 DIAGNOSIS — I63133 Cerebral infarction due to embolism of bilateral carotid arteries: Secondary | ICD-10-CM

## 2017-05-17 DIAGNOSIS — Z7901 Long term (current) use of anticoagulants: Secondary | ICD-10-CM

## 2017-05-17 NOTE — Patient Instructions (Addendum)
Medication Instructions:     If you need a refill on your cardiac medications before your next appointment, please call your pharmacy.  Labwork: NONE ORDERED  TODAY    Testing/Procedures:  NONE ORDERED  TODAY    Follow-Up: Your physician wants you to follow-up in: [place] will receive a reminder letter in the mail two months in advance. If you don't receive a letter, please call our office to schedule the follow-up appointment.     Any Other Special Instructions Will Be Listed Below (If Applicable).

## 2017-05-29 ENCOUNTER — Other Ambulatory Visit: Payer: Self-pay | Admitting: Interventional Cardiology

## 2017-06-09 ENCOUNTER — Ambulatory Visit: Payer: Medicare Other | Admitting: *Deleted

## 2017-06-09 DIAGNOSIS — I4891 Unspecified atrial fibrillation: Secondary | ICD-10-CM | POA: Diagnosis not present

## 2017-06-09 DIAGNOSIS — I6789 Other cerebrovascular disease: Secondary | ICD-10-CM | POA: Diagnosis not present

## 2017-06-09 DIAGNOSIS — Z5181 Encounter for therapeutic drug level monitoring: Secondary | ICD-10-CM

## 2017-06-09 LAB — POCT INR: INR: 2.3 (ref 2.0–3.0)

## 2017-06-09 NOTE — Patient Instructions (Signed)
Description   Today May 31st take 1 and 1/2 tablets as he did not take coumadin yesterday then continue taking 1 tablet daily except 1 and 1/2 tablets on Sundays and  Wednesdays.  Recheck in 6 weeks. Call us with any medication changes [phone]

## 2017-07-03 ENCOUNTER — Other Ambulatory Visit: Payer: Self-pay | Admitting: Interventional Cardiology

## 2017-07-21 ENCOUNTER — Ambulatory Visit: Payer: Medicare Other | Admitting: *Deleted

## 2017-07-21 DIAGNOSIS — I6789 Other cerebrovascular disease: Secondary | ICD-10-CM | POA: Diagnosis not present

## 2017-07-21 DIAGNOSIS — I63133 Cerebral infarction due to embolism of bilateral carotid arteries: Secondary | ICD-10-CM | POA: Diagnosis not present

## 2017-07-21 DIAGNOSIS — I4891 Unspecified atrial fibrillation: Secondary | ICD-10-CM

## 2017-07-21 DIAGNOSIS — Z5181 Encounter for therapeutic drug level monitoring: Secondary | ICD-10-CM

## 2017-07-21 LAB — POCT INR: INR: 2.2 (ref 2.0–3.0)

## 2017-07-21 NOTE — Patient Instructions (Signed)
Description   Continue taking 1 tablet daily except 1 and 1/2 tablets on Sundays and  Wednesdays.  Recheck in 6 weeks. Call us with any medication changes [phone]

## 2017-08-28 ENCOUNTER — Other Ambulatory Visit: Payer: Self-pay | Admitting: Interventional Cardiology

## 2017-08-30 ENCOUNTER — Other Ambulatory Visit: Payer: Self-pay | Admitting: Interventional Cardiology

## 2017-09-01 ENCOUNTER — Ambulatory Visit: Payer: Medicare Other | Admitting: *Deleted

## 2017-09-01 ENCOUNTER — Other Ambulatory Visit: Payer: Self-pay | Admitting: Family Medicine

## 2017-09-01 DIAGNOSIS — Z5181 Encounter for therapeutic drug level monitoring: Secondary | ICD-10-CM

## 2017-09-01 DIAGNOSIS — I6789 Other cerebrovascular disease: Secondary | ICD-10-CM | POA: Diagnosis not present

## 2017-09-01 DIAGNOSIS — I4891 Unspecified atrial fibrillation: Secondary | ICD-10-CM

## 2017-09-01 LAB — POCT INR: INR: 2.3 (ref 2.0–3.0)

## 2017-09-01 NOTE — Patient Instructions (Signed)
Description   Continue taking 1 tablet daily except 1 and 1/2 tablets on Sundays and  Wednesdays.  Recheck in 6 weeks. Call us with any medication changes [phone]

## 2017-09-07 ENCOUNTER — Telehealth: Payer: Self-pay | Admitting: Interventional Cardiology

## 2017-09-07 NOTE — Telephone Encounter (Signed)
New Message           [facility] Pre-operative Risk Assessment    Request for surgical clearance:  1. What type of surgery is being performed? Cystoscopy Urolift  2. When is this surgery scheduled? Pending clearance  3. What type of clearance is required (medical clearance vs. Pharmacy clearance to hold med vs. Both)? Both  4. Are there any medications that need to be held prior to surgery and how long? Asprin held for 5 days/Warfarin held for 5 days  5. Practice name and name of physician performing surgery? [facility]  6. What is your office phone number [phone] ext [phone]   7.   What is your office fax number [phone]  8.   Anesthesia type (None, local, MAC, general) ? General   [name] 09/07/2017, 3:55 PM  _________________________________________________________________   (provider comments below)

## 2017-09-08 NOTE — Telephone Encounter (Signed)
I would recommend bridging with Lovenox.

## 2017-09-08 NOTE — Telephone Encounter (Signed)
Patient with diagnosis of Afib with history of embolic stroke on warfarin for anticoagulation.    Procedure: cystoscopy urolift Date of procedure: TBD  CHADS2-VASc score of  5 (CHF, HTN, AGE, DM2, stroke/tia x 2, CAD, AGE, male)  CrCl 78ml/min Platelet count 88 K/ul (2017)  With a history of stroke, we would recommend bridging with lovenox (per our record he has never been bridged before), but with age>80 and low platelets in 2017 will route to Dr. Tamala Julian for confirmation on bridging.

## 2017-09-08 NOTE — Telephone Encounter (Signed)
   Primary Cardiologist: Sinclair Grooms, MD  Chart reviewed as part of pre-operative protocol coverage.   Patient has a hx of permanent atrial fibrillation, prior CVA, hypertension.  Patient was last seen in 05/2017.  I spoke to the patient today.  Since last seen, he has done well without chest pain or shortness of breath.   CHADS2-VASc=5 (age x 2, CVA x 2, HTN).  RCRI:  0.9% risk for MACE (low risk). Therefore, he may proceed at acceptable risk.  Will await recommendations from CVRR and Dr. Tamala Julian regarding +/- Lovenox bridging.   Richardson Dopp, PA-C 09/08/2017, 11:35 AM

## 2017-09-18 ENCOUNTER — Telehealth: Payer: Self-pay | Admitting: Interventional Cardiology

## 2017-09-18 NOTE — Telephone Encounter (Signed)
Attempted to return call to [facility] at [facility], per telephone note in [name] from 09/07/17 pt has been cleared for surgery to hold Warfarin with Lovenox bridging.  Need date of procedure to proceed.  LMOM TCB with surgery date.

## 2017-09-18 NOTE — Telephone Encounter (Signed)
New Message         [name] is calling from [facility] asking Dr. Tamala Julian to handle/bridge (Lovenox). Any questions pls call connie at [facility]

## 2017-09-19 ENCOUNTER — Other Ambulatory Visit: Payer: Self-pay | Admitting: Urology

## 2017-09-20 NOTE — Telephone Encounter (Signed)
[name] from [facility] returned call - surgery is scheduled for October 1st at 1:45pm. Will coordinate Lovenox bridge in clinic. Called pt and r/s upcoming INR appt to 1 week before procedure to coordinate periprocedural anticoagulation.

## 2017-09-28 NOTE — Progress Notes (Signed)
  09-18-17 (Epic) Cardiac Clearance from Dr. Tamala Julian in Telephone Encounter   05-17-17 (Epic) EKG

## 2017-09-28 NOTE — Patient Instructions (Addendum)
[name]  09/28/2017   Your procedure is scheduled on: 10-10-17   Report to [facility] Main  Entrance    Report to admitting at 11:45 AM    Call this number if you have problems the morning of surgery [phone]    Remember: Do not eat food or drink liquids : After Midnight. You may have a Clear Liquid Diet from Midnight until 7:45 AM. After 7:45 AM, nothing until after surgery.     CLEAR LIQUID DIET   Foods Allowed                                                                     Foods Excluded  Coffee and tea, regular and decaf                             liquids that you cannot  Plain Jell-O in any flavor                                             see through such as: Fruit ices (not with fruit pulp)                                     milk, soups, orange juice  Iced Popsicles                                    All solid food Carbonated beverages, regular and diet                                    Cranberry, grape and apple juices Sports drinks like Gatorade Lightly seasoned clear broth or consume(fat free) Sugar, honey syrup  Sample Menu Breakfast                                Lunch                                     Supper Cranberry juice                    Beef broth                            Chicken broth Jell-O                                     Grape juice  Apple juice Coffee or tea                        Jell-O                                      Popsicle                                                Coffee or tea                        Coffee or tea  _____________________________________________________________________     BRUSH YOUR TEETH MORNING OF SURGERY AND RINSE YOUR MOUTH OUT, NO CHEWING GUM CANDY OR MINTS.     Take these medicines the morning of surgery with A SIP OF WATER: Metoprolol Succinate (Toprol)                                You may not have any metal on your body including  hair pins and              piercings  Do not wear jewelry,  lotions, powders, cologne or deodorant             Men may shave their face and neck.                Do not bring valuables to the hospital. [place].  Contacts, dentures or bridgework may not be worn into surgery.       Patients discharged the day of surgery will not be allowed to drive home.  Name and phone number of your driver: Cutler Sunday [phone]                Please read over the following fact sheets you were given: _____________________________________________________________________             [facility] - Preparing for Surgery Before surgery, you can play an important role.  Because skin is not sterile, your skin needs to be as free of germs as possible.  You can reduce the number of germs on your skin by washing with CHG (chlorahexidine gluconate) soap before surgery.  CHG is an antiseptic cleaner which kills germs and bonds with the skin to continue killing germs even after washing. Please DO NOT use if you have an allergy to CHG or antibacterial soaps.  If your skin becomes reddened/irritated stop using the CHG and inform your nurse when you arrive at Short Stay. Do not shave (including legs and underarms) for at least 48 hours prior to the first CHG shower.  You may shave your face/neck. Please follow these instructions carefully:  1.  Shower with CHG Soap the night before surgery and the  morning of Surgery.  2.  If you choose to wash your hair, wash your hair first as usual with your  normal  shampoo.  3.  After you shampoo, rinse your hair and body thoroughly to remove the  shampoo.  4.  Use CHG as you would any other liquid soap.  You can apply chg directly  to the skin and wash                       Gently with a scrungie or clean washcloth.  5.  Apply the CHG Soap to your body ONLY FROM THE NECK DOWN.   Do not use on face/  open                           Wound or open sores. Avoid contact with eyes, ears mouth and genitals (private parts).                       Wash face,  Genitals (private parts) with your normal soap.             6.  Wash thoroughly, paying special attention to the area where your surgery  will be performed.  7.  Thoroughly rinse your body with warm water from the neck down.  8.  DO NOT shower/wash with your normal soap after using and rinsing off  the CHG Soap.                9.  Pat yourself dry with a clean towel.            10.  Wear clean pajamas.            11.  Place clean sheets on your bed the night of your first shower and do not  sleep with pets. Day of Surgery : Do not apply any lotions/deodorants the morning of surgery.  Please wear clean clothes to the hospital/surgery center.  FAILURE TO FOLLOW THESE INSTRUCTIONS MAY RESULT IN THE CANCELLATION OF YOUR SURGERY PATIENT SIGNATURE_________________________________  NURSE SIGNATURE__________________________________  ________________________________________________________________________

## 2017-10-03 ENCOUNTER — Encounter (HOSPITAL_COMMUNITY)
Admission: RE | Admit: 2017-10-03 | Discharge: 2017-10-03 | Disposition: A | Discharge status: Home / Self Care | Payer: Medicare Other | Source: Ambulatory Visit | Attending: Urology | Admitting: Urology

## 2017-10-03 ENCOUNTER — Ambulatory Visit (INDEPENDENT_AMBULATORY_CARE_PROVIDER_SITE_OTHER): Payer: Medicare Other | Admitting: *Deleted

## 2017-10-03 ENCOUNTER — Encounter (HOSPITAL_COMMUNITY): Payer: Self-pay

## 2017-10-03 ENCOUNTER — Other Ambulatory Visit: Payer: Self-pay

## 2017-10-03 DIAGNOSIS — Z5181 Encounter for therapeutic drug level monitoring: Secondary | ICD-10-CM

## 2017-10-03 DIAGNOSIS — N401 Enlarged prostate with lower urinary tract symptoms: Secondary | ICD-10-CM | POA: Diagnosis not present

## 2017-10-03 DIAGNOSIS — N471 Phimosis: Secondary | ICD-10-CM | POA: Insufficient documentation

## 2017-10-03 DIAGNOSIS — I6789 Other cerebrovascular disease: Secondary | ICD-10-CM

## 2017-10-03 DIAGNOSIS — Z01812 Encounter for preprocedural laboratory examination: Secondary | ICD-10-CM | POA: Diagnosis present

## 2017-10-03 DIAGNOSIS — I4891 Unspecified atrial fibrillation: Secondary | ICD-10-CM | POA: Diagnosis not present

## 2017-10-03 HISTORY — DX: Malignant (primary) neoplasm, unspecified: C80.1

## 2017-10-03 LAB — POCT INR: INR: 3.4 — AB (ref 2.0–3.0)

## 2017-10-03 LAB — CBC
HCT: 45.3 % (ref 39.0–52.0)
Hemoglobin: 15.1 g/dL (ref 13.0–17.0)
MCH: 29.6 pg (ref 26.0–34.0)
MCHC: 33.3 g/dL (ref 30.0–36.0)
MCV: 88.8 fL (ref 78.0–100.0)
RBC: 5.1 MIL/uL (ref 4.22–5.81)
RDW: 14.1 % (ref 11.5–15.5)
WBC: 7 10*3/uL (ref 4.0–10.5)

## 2017-10-03 LAB — BASIC METABOLIC PANEL
Anion gap: 8 (ref 5–15)
BUN: 15 mg/dL (ref 8–23)
CO2: 30 mmol/L (ref 22–32)
Calcium: 9.3 mg/dL (ref 8.9–10.3)
Chloride: 102 mmol/L (ref 98–111)
Creatinine, Ser: 1.23 mg/dL (ref 0.61–1.24)
GFR, EST AFRICAN AMERICAN: 60 mL/min — AB (ref >60)
GFR, EST NON AFRICAN AMERICAN: 52 mL/min — AB (ref >60)
Glucose, Bld: 142 mg/dL — ABNORMAL HIGH (ref 70–99)
POTASSIUM: 4.3 mmol/L (ref 3.5–5.1)
Sodium: 140 mmol/L (ref 135–145)

## 2017-10-03 MED ORDER — ENOXAPARIN SODIUM 100 MG/ML ~~LOC~~ SOLN: 100.0000 mg | Freq: Two times a day (BID) | SUBCUTANEOUS | 1 refills | Status: DC

## 2017-10-03 NOTE — Patient Instructions (Addendum)
9/23: Last dose of Coumadin.  9/24: No Coumadin or Lovenox.  9/25: No Coumadin or Lovenox.  9/26: Inject Lovenox 100mg  in the fatty abdominal tissue at least 2 inches from the belly button twice a day about 12 hours apart, 8am and 8pm rotate sites. No Coumadin.  9/27: Inject Lovenox 100mg  in the fatty abdominal tissue at least 2 inches from the belly button twice a day about 12 hours apart, 8am and 8pm rotate sites. No Coumadin.  9/28: Inject Lovenox in the fatty tissue every 12 hours, 8am and 8pm. No Coumadin.  9/29: Inject Lovenox in the fatty tissue every 12 hours, 8am and 8pm. No Coumadin.  9/30: Inject Lovenox in the fatty tissue in the morning at 8 am (No PM dose). No Coumadin.  10/1: Procedure Day - No Lovenox - Resume Coumadin in the evening or as directed by doctor. Restart your normal dose.   10/2: Resume Lovenox inject in the fatty tissue every 12 hours and take Coumadin.  10/3: Inject Lovenox in the fatty tissue every 12 hours and take Coumadin.  10/4: Inject Lovenox in the fatty tissue every 12 hours and take Coumadin.  10/5: Inject Lovenox in the fatty tissue every 12 hours and take Coumadin.  10/6: Inject Lovenox in the fatty tissue every 12 hours and take Coumadin.  10/7: Coumadin appt to check INR.

## 2017-10-10 ENCOUNTER — Encounter (HOSPITAL_COMMUNITY): Payer: Self-pay | Admitting: General Practice

## 2017-10-10 ENCOUNTER — Ambulatory Visit (HOSPITAL_COMMUNITY)
Admission: RE | Admit: 2017-10-10 | Discharge: 2017-10-10 | Disposition: A | Discharge status: Home / Self Care | Payer: Medicare Other | Source: Ambulatory Visit | Attending: Urology | Admitting: Urology

## 2017-10-10 ENCOUNTER — Ambulatory Visit (HOSPITAL_COMMUNITY): Payer: Medicare Other | Admitting: Anesthesiology

## 2017-10-10 ENCOUNTER — Encounter (HOSPITAL_COMMUNITY)
Admission: RE | Disposition: A | Discharge status: Home / Self Care | Payer: Self-pay | Source: Ambulatory Visit | Attending: Urology

## 2017-10-10 DIAGNOSIS — Z8673 Personal history of transient ischemic attack (TIA), and cerebral infarction without residual deficits: Secondary | ICD-10-CM | POA: Diagnosis not present

## 2017-10-10 DIAGNOSIS — R3912 Poor urinary stream: Secondary | ICD-10-CM | POA: Diagnosis not present

## 2017-10-10 DIAGNOSIS — Z79899 Other long term (current) drug therapy: Secondary | ICD-10-CM | POA: Insufficient documentation

## 2017-10-10 DIAGNOSIS — Z85828 Personal history of other malignant neoplasm of skin: Secondary | ICD-10-CM | POA: Diagnosis not present

## 2017-10-10 DIAGNOSIS — N471 Phimosis: Secondary | ICD-10-CM | POA: Insufficient documentation

## 2017-10-10 DIAGNOSIS — N138 Other obstructive and reflux uropathy: Secondary | ICD-10-CM

## 2017-10-10 DIAGNOSIS — Z7901 Long term (current) use of anticoagulants: Secondary | ICD-10-CM | POA: Insufficient documentation

## 2017-10-10 DIAGNOSIS — Z7982 Long term (current) use of aspirin: Secondary | ICD-10-CM | POA: Insufficient documentation

## 2017-10-10 DIAGNOSIS — N401 Enlarged prostate with lower urinary tract symptoms: Secondary | ICD-10-CM | POA: Diagnosis not present

## 2017-10-10 DIAGNOSIS — Z87891 Personal history of nicotine dependence: Secondary | ICD-10-CM | POA: Diagnosis not present

## 2017-10-10 DIAGNOSIS — I4891 Unspecified atrial fibrillation: Secondary | ICD-10-CM | POA: Insufficient documentation

## 2017-10-10 DIAGNOSIS — I10 Essential (primary) hypertension: Secondary | ICD-10-CM | POA: Diagnosis not present

## 2017-10-10 HISTORY — PX: CYSTOSCOPY WITH INSERTION OF UROLIFT: SHX6678

## 2017-10-10 SURGERY — DORSAL SLIT, PREPUCE
Anesthesia: General | Site: Urethra

## 2017-10-10 MED ORDER — 0.9 % SODIUM CHLORIDE (POUR BTL) OPTIME
TOPICAL | Status: DC | PRN
  Administered 2017-10-10: 1000 mL

## 2017-10-10 MED ORDER — LIDOCAINE HCL 2 % IJ SOLN
INTRAMUSCULAR | Status: AC
  Filled 2017-10-10: qty 20

## 2017-10-10 MED ORDER — CEFAZOLIN SODIUM-DEXTROSE 2-4 GM/100ML-% IV SOLN
2.0000 g | Freq: Once | INTRAVENOUS | Status: AC
  Administered 2017-10-10: 2 g via INTRAVENOUS
  Filled 2017-10-10: qty 100

## 2017-10-10 MED ORDER — OXYCODONE HCL 5 MG/5ML PO SOLN
5.0000 mg | Freq: Once | ORAL | Status: DC | PRN
  Filled 2017-10-10: qty 5

## 2017-10-10 MED ORDER — PROPOFOL 10 MG/ML IV BOLUS
INTRAVENOUS | Status: AC
  Filled 2017-10-10: qty 20

## 2017-10-10 MED ORDER — LIDOCAINE HCL 2 % IJ SOLN
INTRAMUSCULAR | Status: DC | PRN
  Administered 2017-10-10: 20 mL

## 2017-10-10 MED ORDER — ONDANSETRON HCL 4 MG/2ML IJ SOLN
INTRAMUSCULAR | Status: DC | PRN
  Administered 2017-10-10: 4 mg via INTRAVENOUS

## 2017-10-10 MED ORDER — STERILE WATER FOR IRRIGATION IR SOLN
Status: DC | PRN
  Administered 2017-10-10: 1000 mL

## 2017-10-10 MED ORDER — LACTATED RINGERS IV SOLN
INTRAVENOUS | Status: DC
  Administered 2017-10-10: 13:00:00 via INTRAVENOUS

## 2017-10-10 MED ORDER — DEXAMETHASONE SODIUM PHOSPHATE 10 MG/ML IJ SOLN
INTRAMUSCULAR | Status: DC | PRN
  Administered 2017-10-10: 10 mg via INTRAVENOUS

## 2017-10-10 MED ORDER — FENTANYL CITRATE (PF) 100 MCG/2ML IJ SOLN
INTRAMUSCULAR | Status: AC
  Filled 2017-10-10: qty 2

## 2017-10-10 MED ORDER — FENTANYL CITRATE (PF) 100 MCG/2ML IJ SOLN: 25.0000 ug | INTRAMUSCULAR | Status: DC | PRN

## 2017-10-10 MED ORDER — SODIUM CHLORIDE 0.9 % IR SOLN
Status: DC | PRN
  Administered 2017-10-10: 9000 mL

## 2017-10-10 MED ORDER — WARFARIN SODIUM 5 MG PO TABS: 5.0000 mg | ORAL_TABLET | ORAL | Status: DC

## 2017-10-10 MED ORDER — PROPOFOL 10 MG/ML IV BOLUS
INTRAVENOUS | Status: DC | PRN
  Administered 2017-10-10: 100 mg via INTRAVENOUS

## 2017-10-10 MED ORDER — CEPHALEXIN 500 MG PO CAPS: 500.0000 mg | ORAL_CAPSULE | Freq: Two times a day (BID) | ORAL | 0 refills | Status: AC

## 2017-10-10 MED ORDER — LIDOCAINE HCL (CARDIAC) PF 100 MG/5ML IV SOSY
PREFILLED_SYRINGE | INTRAVENOUS | Status: DC | PRN
  Administered 2017-10-10: 40 mg via INTRAVENOUS

## 2017-10-10 MED ORDER — FENTANYL CITRATE (PF) 100 MCG/2ML IJ SOLN
INTRAMUSCULAR | Status: DC | PRN
  Administered 2017-10-10: 25 ug via INTRAVENOUS
  Administered 2017-10-10: 50 ug via INTRAVENOUS
  Administered 2017-10-10: 25 ug via INTRAVENOUS

## 2017-10-10 MED ORDER — OXYCODONE HCL 5 MG PO TABS: 5.0000 mg | ORAL_TABLET | Freq: Once | ORAL | Status: DC | PRN

## 2017-10-10 MED ORDER — ONDANSETRON HCL 4 MG/2ML IJ SOLN
INTRAMUSCULAR | Status: AC
  Filled 2017-10-10: qty 2

## 2017-10-10 SURGICAL SUPPLY — 47 items
ADH SKN CLS APL DERMABOND .7 (GAUZE/BANDAGES/DRESSINGS)
BAG URINE DRAINAGE (UROLOGICAL SUPPLIES) IMPLANT
BAG URINE LEG 500ML (DRAIN) IMPLANT
BAG URO CATCHER STRL LF (MISCELLANEOUS) ×3 IMPLANT
BLADE CLIPPER SURG (BLADE) IMPLANT
BLADE SURG 15 STRL LF DISP TIS (BLADE) ×1 IMPLANT
BLADE SURG 15 STRL SS (BLADE) ×3
BNDG GAUZE ELAST 4 BULKY (GAUZE/BANDAGES/DRESSINGS) ×2 IMPLANT
BRIEF STRETCH FOR OB PAD LRG (UNDERPADS AND DIAPERS) ×2 IMPLANT
CATH FOLEY 2WAY SLVR  5CC 16FR (CATHETERS)
CATH FOLEY 2WAY SLVR 30CC 24FR (CATHETERS) ×1 IMPLANT
CATH FOLEY 2WAY SLVR 5CC 16FR (CATHETERS) ×1 IMPLANT
CATH TIEMANN FOLEY 18FR 5CC (CATHETERS) ×2 IMPLANT
CLOTH BEACON ORANGE TIMEOUT ST (SAFETY) ×3 IMPLANT
DERMABOND ADVANCED (GAUZE/BANDAGES/DRESSINGS)
DERMABOND ADVANCED .7 DNX12 (GAUZE/BANDAGES/DRESSINGS) ×1 IMPLANT
DRAPE LAPAROTOMY T 102X78X121 (DRAPES) ×1 IMPLANT
DRESSING TELFA ISLAND 4X8 (GAUZE/BANDAGES/DRESSINGS) ×2 IMPLANT
ELECT PENCIL ROCKER SW 15FT (MISCELLANEOUS) ×2 IMPLANT
ELECT REM PT RETURN 15FT ADLT (MISCELLANEOUS) ×2 IMPLANT
GLOVE BIOGEL M STRL SZ7.5 (GLOVE) ×3 IMPLANT
GOWN STRL REUS W/TWL LRG LVL3 (GOWN DISPOSABLE) ×2 IMPLANT
GOWN STRL REUS W/TWL XL LVL3 (GOWN DISPOSABLE) ×5 IMPLANT
KIT BASIN OR (CUSTOM PROCEDURE TRAY) ×1 IMPLANT
MANIFOLD NEPTUNE II (INSTRUMENTS) ×3 IMPLANT
NDL SAFETY ECLIPSE 18X1.5 (NEEDLE) IMPLANT
NEEDLE HYPO 18GX1.5 SHARP (NEEDLE)
NEEDLE HYPO 22GX1.5 SAFETY (NEEDLE) ×2 IMPLANT
NS IRRIG 1000ML POUR BTL (IV SOLUTION) IMPLANT
PACK CYSTO (CUSTOM PROCEDURE TRAY) ×3 IMPLANT
PACK GENERAL/GYN (CUSTOM PROCEDURE TRAY) ×1 IMPLANT
PLUG CATH AND CAP STER (CATHETERS) ×1 IMPLANT
SPONGE DRAIN TRACH 4X4 STRL 2S (GAUZE/BANDAGES/DRESSINGS) ×2 IMPLANT
SUT CHROMIC 3 0 SH 27 (SUTURE) ×2 IMPLANT
SUT MNCRL AB 4-0 PS2 18 (SUTURE) ×1 IMPLANT
SUT PDS AB 1 CTX 36 (SUTURE) ×1 IMPLANT
SUT SILK 2 0 30  PSL (SUTURE)
SUT SILK 2 0 30 PSL (SUTURE) ×1 IMPLANT
SUT VIC AB 2-0 SH 27 (SUTURE)
SUT VIC AB 2-0 SH 27X BRD (SUTURE) ×4 IMPLANT
SYR 30ML LL (SYRINGE) ×1 IMPLANT
SYR CONTROL 10ML LL (SYRINGE) IMPLANT
SYSTEM UROLIFT (Male Continence) ×10 IMPLANT
TOWEL OR 17X26 10 PK STRL BLUE (TOWEL DISPOSABLE) ×2 IMPLANT
TUBING CONNECTING 10 (TUBING) ×1 IMPLANT
TUBING CONNECTING 10' (TUBING) ×1
WATER STERILE IRR 3000ML UROMA (IV SOLUTION) ×1 IMPLANT

## 2017-10-10 NOTE — Discharge Instructions (Signed)
Indwelling Urinary Catheter Care, Adult Take good care of your catheter to keep it working and to prevent problems. How to wear your catheter Attach your catheter to your leg with tape (adhesive tape) or a leg strap. Make sure it is not too tight. If you use tape, remove any bits of tape that are already on the catheter. How to wear a drainage bag You should have:  A large overnight bag.  A small leg bag.  Overnight Bag You may wear the overnight bag at any time. Always keep the bag below the level of your bladder but off the floor. When you sleep, put a clean plastic bag in a wastebasket. Then hang the bag inside the wastebasket. Leg Bag Never wear the leg bag at night. Always wear the leg bag below your knee. Keep the leg bag secure with a leg strap or tape. How to care for your skin  Clean the skin around the catheter at least once every day.  Shower every day. Do not take baths.  Put creams, lotions, or ointments on your genital area only as told by your doctor.  Do not use powders, sprays, or lotions on your genital area. How to clean your catheter and your skin 1. Wash your hands with soap and water. 2. Wet a washcloth in warm water and gentle (mild) soap. 3. Use the washcloth to clean the skin where the catheter enters your body. Clean downward and wipe away from the catheter in small circles. Do not wipe toward the catheter. 4. Pat the area dry with a clean towel. Make sure to clean off all soap. How to care for your drainage bags Empty your drainage bag when it is ?- full or at least 2-3 times a day. Replace your drainage bag once a month or sooner if it starts to smell bad or look dirty. Do not clean your drainage bag unless told by your doctor. Emptying a drainage bag  Supplies Needed  Rubbing alcohol.  Gauze pad or cotton ball.  Tape or a leg strap.  Steps 1. Wash your hands with soap and water. 2. Separate (detach) the bag from your leg. 3. Hold the bag over  the toilet or a clean container. Keep the bag below your hips and bladder. This stops pee (urine) from going back into the tube. 4. Open the pour spout at the bottom of the bag. 5. Empty the pee into the toilet or container. Do not let the pour spout touch any surface. 6. Put rubbing alcohol on a gauze pad or cotton ball. 7. Use the gauze pad or cotton ball to clean the pour spout. 8. Close the pour spout. 9. Attach the bag to your leg with tape or a leg strap. 10. Wash your hands.  Changing a drainage bag Supplies Needed  Alcohol wipes.  A clean drainage bag.  Adhesive tape or a leg strap.  Steps 1. Wash your hands with soap and water. 2. Separate the dirty bag from your leg. 3. Pinch the rubber catheter with your fingers so that pee does not spill out. 4. Separate the catheter tube from the drainage tube where these tubes connect (at the connection valve). Do not let the tubes touch any surface. 5. Clean the end of the catheter tube with an alcohol wipe. Use a different alcohol wipe to clean the end of the drainage tube. 6. Connect the catheter tube to the drainage tube of the clean bag. 7. Attach the new bag to  the leg with adhesive tape or a leg strap. 8. Wash your hands.  How to prevent infection and other problems  Never pull on your catheter or try to remove it. Pulling can damage tissue in your body.  Always wash your hands before and after touching your catheter.  If a leg strap gets wet, replace it with a dry one.  Drink enough fluids to keep your pee clear or pale yellow, or as told by your doctor.  Do not let the drainage bag or tubing touch the floor.  Wear cotton underwear.  If you are male, wipe from front to back after you poop (have a bowel movement).  Check on the catheter often to make sure it works and the tubing is not twisted. Get help if:  Your pee is cloudy.  Your pee smells unusually bad.  Your pee is not draining into the bag.  Your  tube gets clogged.  Your catheter starts to leak.  Your bladder feels full. Get help right away if:  You have redness, swelling, or pain where the catheter enters your body.  You have fluid, pus, or a bad smell coming from the area where the catheter enters your body.  The area where the catheter enters your body feels warm.  You have a fever.  You have pain in your: ? Stomach (abdomen). ? Legs. ? Lower back. ? Bladder.  You see blood fill the catheter.  Your pee is pink or red.  You feel sick to your stomach (nauseous).  You throw up (vomit).  You have chills.  Your catheter gets pulled out. This information is not intended to replace advice given to you by your health care provider. Make sure you discuss any questions you have with your health care provider. Document Released: 04/23/2012 Document Revised: 11/25/2015 Document Reviewed: 06/11/2013 Elsevier Interactive Patient Education  2018 [employer] Incision, Adult, Care After This sheet gives you information about how to care for yourself after your procedure. Your doctor may also give you more specific instructions. If you have problems or questions, contact your doctor. Follow these instructions at home: Cut care  Follow instructions from your doctor about how to take care of your cut from surgery (incision). Make sure you: ? Wash your hands with soap and water before you change your bandage (dressing). If you cannot use soap and water, use hand sanitizer. ? Change your bandage as told by your doctor. ? Leave stitches (sutures), skin glue, or skin tape (adhesive) strips in place. They may need to stay in place for 2 weeks or longer. If tape strips get loose and curl up, you may trim the loose edges. Do not remove tape strips completely unless your doctor says it is okay  Check your cut area every day for signs of infection. Check for: ? More redness, swelling, or pain. ? More fluid or  blood. ? Warmth. ? Pus or a bad smell. Bathing  Do not take baths, swim, or use a hot tub until your doctor says it is okay. You may take a shower and sponge bath for bathing.  Do not get your cut area wet during the 24 hours after the procedure, or as long as told by your doctor.  When you can shower, do not rub the cut area. Gently pat it dry. General instructions  Avoid heavy lifting, contact sports, biking, or swimming until your doctor says it is okay.  Do not have sex until your doctor says  it is okay.  Take or apply over-the-counter and prescription medicines only as told by your doctor.  Keep all follow-up visits as told by your doctor. This is important. Contact a doctor if:  Medicine does not help your pain.  You have more redness, swelling, or pain around your cut.  You have more fluid or blood coming from your cut.  Your cut feels warm to the touch.  You have pus or a bad smell coming from your cut.  You have a fever. Get help right away if:  You cannot pee (urinate).  It hurts to pee.  Your pain is not helped by medicines.  There is redness, swelling, and soreness that spreads up the shaft of your penis, your thighs, or your lower belly (abdomen).  You have bleeding that does not stop when you press on it. Summary  Follow instructions from your doctor about how to take care of your cut from surgery (incision).  Check your cut area every day for signs of infection.  Do not have sex until your doctor says it is okay. This information is not intended to replace advice given to you by your health care provider. Make sure you discuss any questions you have with your health care provider. Document Released: 06/15/2007 Document Revised: 01/05/2016 Document Reviewed: 01/05/2016 Elsevier Interactive Patient Education  2017 [employer] Inc[employer]  Prostatic Urethral Lift, Care After This sheet gives you information about how to care for yourself after your procedure.  Your health care provider may also give you more specific instructions. If you have problems or questions, contact your health care provider. What can I expect after the procedure? After the procedure, it is common to have:  Discomfort or burning when urinating.  An increased urge to urinate.  More frequent urination.  Urine that is slightly blood-tinged.  These symptoms should go away after a few days. Follow these instructions at home:  Take over-the-counter and prescription medicines only as told by your health care provider.  Do not drive for 24 hours if you were given a medicine to help you relax (sedative).  Do not drive or use heavy machinery while taking prescription pain medicine.  Do not lift anything that is heavier than 10 lb (4.5 kg) until your health care provider says that this is safe.  Return to your normal activities as told by your health care provider. Ask your health care provider what activities are safe for you. Ask when you can return to sexual activity.  Drink enough fluid to keep your urine clear or pale yellow.  Keep all follow-up visits as told by your health care provider. This is important. Contact a health care provider if:  You have chills or a fever.  You have pain when passing urine.  You have bright red blood or blood clots in your urine.  You have difficulty passing urine.  You have leaking of urine (incontinence). Get help right away if:  You have chest pain or shortness of breath.  You have leg pain or swelling.  You cannot pass urine. Summary  After the procedure, it is common to have discomfort or burning when urinating, an increased urge to urinate, more frequent urination, and urine that is slightly blood-tinged.  Do not drive for 24 hours if you were given a medicine to help you relax (sedative). Do not drive or use heavy machinery while taking prescription pain medicine.  Do not lift anything that is heavier than 10 lb  (4.5 kg) until  your health care provider says that this is safe.  Return to your normal activities as told by your health care provider. This information is not intended to replace advice given to you by your health care provider. Make sure you discuss any questions you have with your health care provider. Document Released: 02/16/2016 Document Revised: 02/16/2016 Document Reviewed: 02/16/2016 Elsevier Interactive Patient Education  2018 [employer].

## 2017-10-10 NOTE — H&P (Addendum)
H&P  Chief Complaint: BPH and phimosis  History of Present Illness: [name] is an 84 year old white male with a history of BPH and phimosis.  He has been on tamsulosin and finasteride but continues to have a moderately high AUA symptom score of 16.  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  He was brought today for dorsal slit and cystoscopy with possible UroLift prostatic urethral lift procedure if his anatomy is favorable.  He has been well without fever or dysuria.  Past Medical History:  Diagnosis Date  . Atrial fibrillation ([place]) 10/15/2012  . Cancer (HCC)    Skin (R hand)  . Embolic stroke ([place]) 66/06/3014  . Essential hypertension 01/21/2014  . Long term current use of anticoagulant therapy 12/25/2012   Past Surgical History:  Procedure Laterality Date  . APPENDECTOMY    . CARDIAC CATHETERIZATION    . EYE SURGERY Bilateral 2013   Cataracts    Home Medications:  Medications Prior to Admission  Medication Sig Dispense Refill Last Dose  . acetaminophen (TYLENOL) 500 MG tablet Take 500-1,000 mg by mouth every 6 (six) hours as needed (FOR PAIN.).   Past Week at Unknown time  . aspirin EC 81 MG tablet Take 81 mg by mouth at bedtime.   10/02/2017  . calcium carbonate (OS-CAL) 600 MG TABS tablet Take 600 mg by mouth daily.    09/28/2017  . enoxaparin (LOVENOX) 100 MG/ML injection Inject 1 mL (100 mg total) into the skin every 12 (twelve) hours. 20 Syringe 1 10/09/2017 at 2000  . finasteride (PROSCAR) 5 MG tablet Take 5 mg by mouth daily.   10/09/2017 at Unknown time  . Glucosamine HCl (GLUCOSAMINE PO) Take 1,500 mg by mouth daily.    10/02/2017  . hydrochlorothiazide (HYDRODIURIL) 12.5 MG tablet Take 1 tablet (12.5 mg total) by mouth daily. 90 tablet 3 10/09/2017 at Unknown time  . lisinopril (PRINIVIL,ZESTRIL) 10 MG tablet Take 10 mg by mouth daily.   10/09/2017 at Unknown time  . metoprolol succinate (TOPROL-XL) 50 MG 24 hr tablet TAKE 1 TABLET BY MOUTH ONCE DAILY  (Patient taking differently: Take 50 mg by mouth daily. ) 90 tablet 3 10/09/2017 at Unknown time  . Multiple Vitamin (MULTIVITAMIN) tablet Take 1 tablet by mouth daily.   10/02/2017  . Multiple Vitamins-Minerals (PRESERVISION AREDS 2) CAPS Take 1 capsule by mouth 2 (two) times daily.   10/02/2017  . tamsulosin (FLOMAX) 0.4 MG CAPS capsule Take 0.4 mg by mouth every evening.    10/02/2017  . warfarin (COUMADIN) 5 MG tablet TAKE AS DIRECTED BY  ANTICOAGULATION  CLINIC (Patient taking differently: Take 5-7.5 mg by mouth See admin instructions. TAKE 1 TABLET (5 MG) BY MOUTH DAILY,  EXCEPT TAKE 1.5 TABLETS (7.5 MG) ON SUNDAYS & WEDNESDAYS IN THE EVENING.) 120 tablet 1 10/02/2017   Allergies: No Known Allergies  Family History  Problem Relation Age of Onset  . Other Mother        Brain hemmorrhage  . Other Father        unknown   Social History:  reports that he has quit smoking. His smoking use included cigarettes. He has a 2.50 pack-year smoking history. He has never used smokeless tobacco. He reports that he does not drink alcohol or use drugs.  ROS: A complete review of systems was performed.  All systems are negative except for pertinent findings as noted. Review of Systems  All other systems reviewed and are negative.    Physical  Exam:  Vital signs in last 24 hours: Temp:  [97.8 F (36.6 C)] 97.8 F (36.6 C) (10/01 1210) Pulse Rate:  [102] 102 (10/01 1210) Resp:  [20] 20 (10/01 1210) BP: (151)/(94) 151/94 (10/01 1210) SpO2:  [95 %] 95 % (10/01 1210) Weight:  [95.3 kg] 95.3 kg (10/01 1234) General:  Alert and oriented, No acute distress HEENT: Normocephalic, atraumatic Neck: No JVD or lymphadenopathy Cardiovascular: Regular rate and rhythm Lungs: Regular rate and effort Abdomen: Soft, nontender, nondistended, no abdominal masses Back: No CVA tenderness Extremities: mild bilateral LE edema Neurologic: Grossly intact GU: tight phimosis   Laboratory Data:  No results found for  this or any previous visit (from the past 24 hour(s)). No results found for this or any previous visit (from the past 240 hour(s)). Creatinine: No results for input(s): CREATININE in the last 168 hours.  Impression/Assessment/plan:  I discussed with the patient and his family the nature, potential benefits, risks and alternatives to dorsal slit, cystoscopy and possible Urolift prostatic urethral lift, including side effects of the proposed treatments, the likelihood of the patient achieving the goals of the procedures, and any potential problems that might occur during the procedure or recuperation. All questions answered. Patient elects to proceed.    [name] 10/10/2017, 2:33 PM

## 2017-10-10 NOTE — Anesthesia Procedure Notes (Signed)
Procedure Name: LMA Insertion Date/Time: 10/10/2017 3:42 PM Performed by: Glory Buff, CRNA Pre-anesthesia Checklist: Patient identified, Emergency Drugs available, Suction available and Patient being monitored Patient Re-evaluated:Patient Re-evaluated prior to induction Oxygen Delivery Method: Circle system utilized Preoxygenation: Pre-oxygenation with 100% oxygen Induction Type: IV induction LMA Size: 5.0 Number of attempts: 1 Placement Confirmation: positive ETCO2 Tube secured with: Tape Dental Injury: Teeth and Oropharynx as per pre-operative assessment

## 2017-10-10 NOTE — Transfer of Care (Signed)
Immediate Anesthesia Transfer of Care Note  Patient: [name]  Procedure(s) Performed: DORSAL SLIT (N/A Urethra) CYSTOSCOPY WITH INSERTION OF UROLIFT (N/A Urethra)  Patient Location: PACU  Anesthesia Type:General  Level of Consciousness: awake, drowsy and patient cooperative  Airway & Oxygen Therapy: Patient Spontanous Breathing and Patient connected to face mask oxygen  Post-op Assessment: Report given to RN and Post -op Vital signs reviewed and stable  Post vital signs: Reviewed and stable  Last Vitals:  Vitals Value Taken Time  BP 146/94 10/10/2017  5:09 PM  Temp    Pulse 92 10/10/2017  5:10 PM  Resp 19 10/10/2017  5:10 PM  SpO2 97 % 10/10/2017  5:10 PM  Vitals shown include unvalidated device data.  Last Pain:  Vitals:   10/10/17 1210  TempSrc: Oral         Complications: No apparent anesthesia complications

## 2017-10-10 NOTE — Anesthesia Preprocedure Evaluation (Addendum)
Anesthesia Evaluation  Patient identified by MRN, date of birth, ID band Patient awake    Reviewed: Allergy & Precautions, NPO status , Patient's Chart, lab work & pertinent test results  History of Anesthesia Complications Negative for: history of anesthetic complications  Airway Mallampati: II  TM Distance: >3 FB Neck ROM: Full    Dental  (+) Upper Dentures, Lower Dentures   Pulmonary former smoker,    breath sounds clear to auscultation       Cardiovascular hypertension,  Rhythm:Regular     Neuro/Psych CVA, No Residual Symptoms negative psych ROS   GI/Hepatic negative GI ROS, Neg liver ROS,   Endo/Other    Renal/GU Renal InsufficiencyRenal disease     Musculoskeletal   Abdominal   Peds  Hematology negative hematology ROS (+)   Anesthesia Other Findings   Reproductive/Obstetrics                            Anesthesia Physical Anesthesia Plan  ASA: III  Anesthesia Plan: General   Post-op Pain Management:    Induction: Intravenous  PONV Risk Score and Plan: 2 and Ondansetron and Dexamethasone  Airway Management Planned: LMA and Oral ETT  Additional Equipment: None  Intra-op Plan:   Post-operative Plan: Extubation in OR  Informed Consent: I have reviewed the patients History and Physical, chart, labs and discussed the procedure including the risks, benefits and alternatives for the proposed anesthesia with the patient or authorized representative who has indicated his/her understanding and acceptance.   Dental advisory given  Plan Discussed with: Surgeon and CRNA  Anesthesia Plan Comments:         Anesthesia Quick Evaluation

## 2017-10-10 NOTE — Op Note (Signed)
Preoperative diagnosis: Phimosis, BPH with obstruction Postoperative diagnosis: Same  Procedure: Dorsal foreskin incision, cystoscopy, UroLift prostatic urethral lift x 5 implants  Surgeon: Junious Silk  Anesthesia: General  Indication for procedure: 84 year old with symptomatic phimosis and BPH with weak stream despite maximal medical therapy with tamsulosin and finasteride.   Findings: On exam there was a tight phimosis but once this was released with the dorsal slit the foreskin was normal without mass.    On cystoscopy, there was trilobar hypertrophy although the prostatic urethra was tall but much shorter given his finasteride use.  There was a median lobe with a good sulcus on the left bladder neck but not completely obstructive and the mucosa was quite friable.  The ureteral orifice ease were difficult to visualize.  Moderate trabeculation.  No stone or foreign body in the bladder.  Description of procedure: After consent was obtained patient brought to the operating room.  After adequate anesthesia was placed in lithotomy position and prepped and draped in usual sterile fashion.  Plain lidocaine was used to infiltrate foreskin and at 12:00 the foreskin was crossclamped and then incised with the Metzenbaums.  This was taken back all the way to the coronal sulcus to relieve significant phimosis and scarring.  The foreskin was here adherent to the glans and I left this in place.  The incision was irrigated, the foreskin and glans were reprepped with Betadine.  The incision was irrigated again and the incision was closed with a running 3-0 chromic suture.  There were no for skin lesions or glans lesions.  I then performed cystoscopy with the 21Fr cystoscope. A 7F cystoscope was inserted into the bladder. The cystoscopy bridge was replaced with a UroLift delivery device.The first treatment site was the patient's left side approximately 1.5cm distal to the bladder neck. The distal tip of the delivery  device was then angled laterally approximately 20 degrees at this position to compress the lateral lobe. The trigger was pulled, thereby deploying a needle containing the implant through the prostate. The needle was then retracted, allowing one end of the implant to be delivered to the capsular surface of the prostate. The implant was then tensioned to assure capsular seating and removal of slack monofilament. The device was then angled back toward midline and slowly advanced proximally until cystoscopic verification of the monofilament being centered in the delivery bay. The urethral end piece was then affixed to the monofilament thereby tailoring the size of the implant. Excess filament was then severed. The delivery device was then re-advanced into the bladder. The delivery device was then replaced with cystoscope and bridge and the implant location and opening effect was confirmed cystoscopically. The same procedure was then repeated on the right side, and two additional implants were delivered just proximal to the verumontanum, again one on left and one on right side of the prostate, following the same technique.  This created an excellent anterior channel but there was a partially obstructive median lobe.  Therefore I took a 5th implant and pulled the median lobe down into the prostatic urethra and over to the right using the left sulcus as the starting point. The trigger was pulled deploying the needle, the needle was retracted but the capsular tab never seated properly and the implant would not tension.  I took tension off the suture and went back and tried to bring it on tension again but could not get the capsular tab to seat.  Therefore using the multi tool I fired the knife to  cut the suture.  I searched for the urethral end piece but it was difficult to find as now the median lobe mucosa was oozing readily.  Therefore we switch to water and I passed the Bugbee electrode and fulgurated the median lobe  mucosa to stop the bleeding.  Again, on inspection there was a good anterior channel with just partial obstruction of the median lobe and I felt the best to leave it at this point. I filled and drained and searched for the urethral end piece and could not find it. Hemostasis was good and the scope removed.  He was passing urine per urethra.  An 78 French coud catheter was advanced and seated at the bladder neck and left to gravity drainage.  He was awakened and taken to recovery room in stable condition.  Complications: None  Blood loss: Minimal  Specimen: None  Drains: 34 French coud catheter  Disposition: Patient stable to PACU

## 2017-10-12 ENCOUNTER — Encounter (HOSPITAL_COMMUNITY): Payer: Self-pay | Admitting: Urology

## 2017-10-12 NOTE — Anesthesia Postprocedure Evaluation (Signed)
Anesthesia Post Note  Patient: MOUHAMAD TEED  Procedure(s) Performed: DORSAL SLIT (N/A Urethra) CYSTOSCOPY WITH INSERTION OF UROLIFT (N/A Urethra)     Patient location during evaluation: PACU Anesthesia Type: General Level of consciousness: awake and alert Pain management: pain level controlled Vital Signs Assessment: post-procedure vital signs reviewed and stable Respiratory status: spontaneous breathing, nonlabored ventilation, respiratory function stable and patient connected to nasal cannula oxygen Cardiovascular status: blood pressure returned to baseline and stable Postop Assessment: no apparent nausea or vomiting Anesthetic complications: no    Last Vitals:  Vitals:   10/10/17 1745 10/10/17 1800  BP: (!) 152/97 (!) 150/92  Pulse: 79 80  Resp: 13 14  Temp:  36.7 C  SpO2: 95% 95%    Last Pain:  Vitals:   10/10/17 1800  TempSrc:   PainSc: 0-No pain                 Jeromiah Ohalloran

## 2017-10-16 ENCOUNTER — Ambulatory Visit: Payer: Medicare Other

## 2017-10-16 DIAGNOSIS — I6789 Other cerebrovascular disease: Secondary | ICD-10-CM

## 2017-10-16 DIAGNOSIS — I4891 Unspecified atrial fibrillation: Secondary | ICD-10-CM

## 2017-10-16 DIAGNOSIS — Z5181 Encounter for therapeutic drug level monitoring: Secondary | ICD-10-CM

## 2017-10-16 LAB — POCT INR: INR: 1.2 — AB (ref 2.0–3.0)

## 2017-10-16 NOTE — Patient Instructions (Signed)
Description   Take 1.5 tablets today and tomorrow, then resume same dosage 1 tablet daily except 1.5 tablets on Sundays and Wednesdays.  Resume Lovenox 100mg  injections twice daily (every 12 hours) until recheck.  Recheck on Friday . Call us with any medication changes [phone]

## 2017-10-20 ENCOUNTER — Ambulatory Visit: Payer: Medicare Other

## 2017-10-20 DIAGNOSIS — Z5181 Encounter for therapeutic drug level monitoring: Secondary | ICD-10-CM | POA: Diagnosis not present

## 2017-10-20 DIAGNOSIS — I4891 Unspecified atrial fibrillation: Secondary | ICD-10-CM

## 2017-10-20 DIAGNOSIS — I6789 Other cerebrovascular disease: Secondary | ICD-10-CM

## 2017-10-20 LAB — POCT INR: INR: 1.6 — AB (ref 2.0–3.0)

## 2017-10-20 NOTE — Patient Instructions (Signed)
Description   Take 1.5 tablets today, then resume same dosage 1 tablet daily except 1.5 tablets on Sundays and Wednesdays. Finish Lovenox 100mg  injections twice daily (every 12 hours).  Recheck on Wednesday. Call us with any medication changes [phone]

## 2017-10-25 ENCOUNTER — Ambulatory Visit: Payer: Medicare Other | Admitting: *Deleted

## 2017-10-25 DIAGNOSIS — Z5181 Encounter for therapeutic drug level monitoring: Secondary | ICD-10-CM | POA: Diagnosis not present

## 2017-10-25 DIAGNOSIS — I6789 Other cerebrovascular disease: Secondary | ICD-10-CM | POA: Diagnosis not present

## 2017-10-25 DIAGNOSIS — I4891 Unspecified atrial fibrillation: Secondary | ICD-10-CM | POA: Diagnosis not present

## 2017-10-25 LAB — POCT INR: INR: 1.7 — AB (ref 2.0–3.0)

## 2017-10-25 NOTE — Patient Instructions (Signed)
Description   Take 2 tablets today, tomorrow take 1.5 tablets,  then resume same dosage 1 tablet daily except 1.5 tablets on Sundays and Wednesdays. Take Lovenox 150mg  injection on tomorrow (Thursday) morning at 930am.  Recheck on Monday. Call us with any medication changes [phone]

## 2017-10-30 ENCOUNTER — Ambulatory Visit: Payer: Medicare Other | Admitting: Pharmacist

## 2017-10-30 DIAGNOSIS — Z5181 Encounter for therapeutic drug level monitoring: Secondary | ICD-10-CM

## 2017-10-30 DIAGNOSIS — I4891 Unspecified atrial fibrillation: Secondary | ICD-10-CM | POA: Diagnosis not present

## 2017-10-30 DIAGNOSIS — I6789 Other cerebrovascular disease: Secondary | ICD-10-CM

## 2017-10-30 LAB — POCT INR: INR: 1.9 — AB (ref 2.0–3.0)

## 2017-10-30 NOTE — Patient Instructions (Signed)
Description   Take 1.5 tablets today, then resume same dosage 1 tablet daily except 1.5 tablets on Sundays and Wednesdays. Recheck in 10 days. Call us with any medication changes [phone]

## 2017-11-10 ENCOUNTER — Ambulatory Visit: Payer: Medicare Other

## 2017-11-10 DIAGNOSIS — Z5181 Encounter for therapeutic drug level monitoring: Secondary | ICD-10-CM | POA: Diagnosis not present

## 2017-11-10 DIAGNOSIS — I6789 Other cerebrovascular disease: Secondary | ICD-10-CM

## 2017-11-10 DIAGNOSIS — I4891 Unspecified atrial fibrillation: Secondary | ICD-10-CM | POA: Diagnosis not present

## 2017-11-10 LAB — POCT INR: INR: 2 (ref 2.0–3.0)

## 2017-11-10 NOTE — Patient Instructions (Signed)
Description   Take 1.5 tablets today, then resume same dosage 1 tablet daily except 1.5 tablets on Sundays and Wednesdays. Recheck in 2 weeks. Call us with any medication changes [phone]

## 2017-11-12 NOTE — Progress Notes (Signed)
Cardiology Office Note:    Date:  11/13/2017   ID:  [name], DOB [birth year], MRN [id number]  PCP:  Danny Arabian, MD  Cardiologist:  Danny Grooms, MD   Referring MD: Danny Arabian, MD   No chief complaint on file.   History of Present Illness:    [name] is a 84 y.o. male with a hx of chronic atrial fibrillation, long-term anticoagulation therapy, and history of embolic CVA. He also has essential hypertension.   He denies orthopnea.  Does have some lower extremity swelling.  Wears compression stockings.  Getting around reasonably well.  His wife states that he has had an occasional fall.  He denies orthopnea.  No anginal complaints.  He is not having palpitations.  Had a recent urological procedure where he had circumcision and prostatic lift.  He had some hematuria at that time but that has resolved.  Notices occasional lower extremity swelling.  Is supposed to wear lower extremity compression stockings.  Past Medical History:  Diagnosis Date  . Atrial fibrillation ([place]) 10/15/2012  . Cancer (HCC)    Skin (R hand)  . Embolic stroke ([name]) 08/23/4816  . Essential hypertension 01/21/2014  . Long term current use of anticoagulant therapy 12/25/2012    Past Surgical History:  Procedure Laterality Date  . APPENDECTOMY    . CARDIAC CATHETERIZATION    . CYSTOSCOPY WITH INSERTION OF UROLIFT N/A 10/10/2017   Procedure: CYSTOSCOPY WITH INSERTION OF UROLIFT;  Surgeon: Danny Aloe, MD;  Location: [facility];  Service: Urology;  Laterality: N/A;  NEEDS 60 MIN  . EYE SURGERY Bilateral 2013   Cataracts    Current Medications: Current Meds  Medication Sig  . acetaminophen (TYLENOL) 500 MG tablet Take 500-1,000 mg by mouth every 6 (six) hours as needed (FOR PAIN.).  Marland Kitchen aspirin EC 81 MG tablet Take 81 mg by mouth at bedtime.  . calcium carbonate (OS-CAL) 600 MG TABS tablet Take 600 mg by mouth daily.   Marland Kitchen enoxaparin (LOVENOX) 100 MG/ML injection Inject 1 mL (100 mg  total) into the skin every 12 (twelve) hours.  . finasteride (PROSCAR) 5 MG tablet Take 5 mg by mouth daily.  . Glucosamine HCl (GLUCOSAMINE PO) Take 1,500 mg by mouth daily.   . hydrochlorothiazide (HYDRODIURIL) 12.5 MG tablet Take 1 tablet (12.5 mg total) by mouth daily.  Marland Kitchen lisinopril (PRINIVIL,ZESTRIL) 10 MG tablet Take 10 mg by mouth daily.  . metoprolol succinate (TOPROL-XL) 50 MG 24 hr tablet TAKE 1 TABLET BY MOUTH ONCE DAILY (Patient taking differently: Take 50 mg by mouth daily. )  . Multiple Vitamin (MULTIVITAMIN) tablet Take 1 tablet by mouth daily.  . Multiple Vitamins-Minerals (PRESERVISION AREDS 2) CAPS Take 1 capsule by mouth 2 (two) times daily.  . tamsulosin (FLOMAX) 0.4 MG CAPS capsule Take 0.4 mg by mouth every evening.   . warfarin (COUMADIN) 5 MG tablet Take 1-1.5 tablets (5-7.5 mg total) by mouth See admin instructions. TAKE 1 TABLET (5 MG) BY MOUTH DAILY,  EXCEPT TAKE 1.5 TABLETS (7.5 MG) ON SUNDAYS & WEDNESDAYS IN THE EVENING.     Allergies:   Patient has no known allergies.   Social History   Socioeconomic History  . Marital status: Married    Spouse name: Not on file  . Number of children: Not on file  . Years of education: Not on file  . Highest education level: Not on file  Occupational History  . Not on file  Social Needs  . Financial  resource strain: Not on file  . Food insecurity:    Worry: Not on file    Inability: Not on file  . Transportation needs:    Medical: Not on file    Non-medical: Not on file  Tobacco Use  . Smoking status: Former Smoker    Packs/day: 0.25    Years: 10.00    Pack years: 2.50    Types: Cigarettes  . Smokeless tobacco: Never Used  Substance and Sexual Activity  . Alcohol use: No    Alcohol/week: 0.0 standard drinks  . Drug use: No  . Sexual activity: Not on file  Lifestyle  . Physical activity:    Days per week: Not on file    Minutes per session: Not on file  . Stress: Not on file  Relationships  . Social  connections:    Talks on phone: Not on file    Gets together: Not on file    Attends religious service: Not on file    Active member of club or organization: Not on file    Attends meetings of clubs or organizations: Not on file    Relationship status: Not on file  Other Topics Concern  . Not on file  Social History Narrative  . Not on file     Family History: The patient's family history includes Other in his father and mother.  ROS:   Please see the history of present illness.    Leg swelling but otherwise no issue all other systems reviewed and are negative.  EKGs/Labs/Other Studies Reviewed:    The following studies were reviewed today: None  EKG:  EKG is  ordered today.  The ekg ordered on May 17, 2017 demonstrates atrial fibrillation relatively rapid ventricular rate at 102 bpm.  Otherwise no change compared to prior EKG tracings dating back to 2017.  Recent Labs: 10/03/2017: BUN 15; Creatinine, Ser 1.23; Hemoglobin 15.1; Platelets SPECIMEN CHECKED FOR CLOTS; Potassium 4.3; Sodium 140  Recent Lipid Panel No results found for: CHOL, TRIG, HDL, CHOLHDL, VLDL, LDLCALC, LDLDIRECT  Physical Exam:    VS:  BP 130/74   Pulse 94   Ht 5\' 10"  (1.778 m)   Wt 213 lb 12.8 oz (97 kg)   SpO2 94%   BMI 30.68 kg/m     Wt Readings from Last 3 Encounters:  11/13/17 213 lb 12.8 oz (97 kg)  10/10/17 210 lb 2 oz (95.3 kg)  10/03/17 210 lb 2 oz (95.3 kg)     GEN: Early.  Masklike faces.  Well nourished, well developed in no acute distress HEENT: Normal NECK: No JVD. LYMPHATICS: No lymphadenopathy CARDIAC: Irregularly irregular RR, no murmur, no gallop, no edema. VASCULAR: 2+ bilateral radial and carotid pulses.  No bruits. RESPIRATORY:  Clear to auscultation without rales, wheezing or rhonchi  ABDOMEN: Soft, non-tender, non-distended, No pulsatile mass, MUSCULOSKELETAL: No deformity  SKIN: Warm and dry NEUROLOGIC:  Alert and oriented x 3 PSYCHIATRIC:  Normal affect    ASSESSMENT:    1. Chronic atrial fibrillation   2. Long term current use of anticoagulant therapy   3. Essential hypertension   4. Mixed hyperlipidemia    PLAN:    In order of problems listed above:  1. Moderate rate control.  No different than last several years. 2. Continue Coumadin therapy.  If bleeding becomes an issue, aspirin can be stopped. 3. Blood pressure control is adequate.  No change in therapy.  Target 130/80 mmHg. 4. Lipids are followed by primary care.  Last checked 1 year ago with LDL 89.  Clinical follow-up in 1 year.  Conservative medical management unless overt cardiac symptoms.   Medication Adjustments/Labs and Tests Ordered: Current medicines are reviewed at length with the patient today.  Concerns regarding medicines are outlined above.  No orders of the defined types were placed in this encounter.  No orders of the defined types were placed in this encounter.   Patient Instructions  Medication Instructions:  Your physician recommends that you continue on your current medications as directed. Please refer to the Current Medication list given to you today.  If you need a refill on your cardiac medications before your next appointment, please call your pharmacy.   Lab work: None If you have labs (blood work) drawn today and your tests are completely normal, you will receive your results only by: Marland Kitchen MyChart Message (if you have MyChart) OR . A paper copy in the mail If you have any lab test that is abnormal or we need to change your treatment, we will call you to review the results.  Testing/Procedures: None  Follow-Up: At [facility], you and your health needs are our priority.  As part of our continuing mission to provide you with exceptional heart care, we have created designated Provider Care Teams.  These Care Teams include your primary Cardiologist (physician) and Advanced Practice Providers (APPs -  Physician Assistants and Nurse  Practitioners) who all work together to provide you with the care you need, when you need it. You will need a follow up appointment in 12 months.  Please call our office 2 months in advance to schedule this appointment.  You may see Danny Grooms, MD or one of the following Advanced Practice Providers on your designated Care Team:   Truitt Merle, NP Cecilie Kicks, NP . Kathyrn Drown, NP  Any Other Special Instructions Will Be Listed Below (If Applicable).       Signed, Danny Grooms, MD  11/13/2017 3:04 PM    [facility]

## 2017-11-13 ENCOUNTER — Ambulatory Visit: Payer: Medicare Other | Admitting: Interventional Cardiology

## 2017-11-13 ENCOUNTER — Encounter: Payer: Self-pay | Admitting: Interventional Cardiology

## 2017-11-13 VITALS — BP 130/74 | HR 94 | Ht 70.0 in | Wt 213.8 lb

## 2017-11-13 DIAGNOSIS — Z7901 Long term (current) use of anticoagulants: Secondary | ICD-10-CM

## 2017-11-13 DIAGNOSIS — I10 Essential (primary) hypertension: Secondary | ICD-10-CM

## 2017-11-13 DIAGNOSIS — E782 Mixed hyperlipidemia: Secondary | ICD-10-CM

## 2017-11-13 DIAGNOSIS — I482 Chronic atrial fibrillation, unspecified: Secondary | ICD-10-CM

## 2017-11-13 NOTE — Patient Instructions (Signed)

## 2017-11-24 ENCOUNTER — Ambulatory Visit: Payer: Medicare Other | Admitting: *Deleted

## 2017-11-24 DIAGNOSIS — Z5181 Encounter for therapeutic drug level monitoring: Secondary | ICD-10-CM

## 2017-11-24 DIAGNOSIS — I6789 Other cerebrovascular disease: Secondary | ICD-10-CM | POA: Diagnosis not present

## 2017-11-24 DIAGNOSIS — I4891 Unspecified atrial fibrillation: Secondary | ICD-10-CM

## 2017-11-24 LAB — POCT INR: INR: 2.2 (ref 2.0–3.0)

## 2017-11-24 NOTE — Patient Instructions (Signed)
Description   Continue taking 1 tablet daily except 1.5 tablets on Sundays and Wednesdays. Recheck in 3 weeks. Call us with any medication changes [phone]

## 2017-12-13 ENCOUNTER — Ambulatory Visit: Payer: Medicare Other | Admitting: *Deleted

## 2017-12-13 DIAGNOSIS — I4891 Unspecified atrial fibrillation: Secondary | ICD-10-CM | POA: Diagnosis not present

## 2017-12-13 DIAGNOSIS — Z5181 Encounter for therapeutic drug level monitoring: Secondary | ICD-10-CM

## 2017-12-13 DIAGNOSIS — I6789 Other cerebrovascular disease: Secondary | ICD-10-CM

## 2017-12-13 LAB — POCT INR: INR: 2 (ref 2.0–3.0)

## 2017-12-13 NOTE — Patient Instructions (Signed)
Description   Continue taking 1 tablet daily except 1.5 tablets on Sundays and Wednesdays. Recheck in 3 weeks. Call us with any medication changes [phone]

## 2018-01-12 ENCOUNTER — Ambulatory Visit: Payer: Medicare Other

## 2018-01-12 DIAGNOSIS — I4891 Unspecified atrial fibrillation: Secondary | ICD-10-CM | POA: Diagnosis not present

## 2018-01-12 DIAGNOSIS — I6789 Other cerebrovascular disease: Secondary | ICD-10-CM

## 2018-01-12 DIAGNOSIS — Z5181 Encounter for therapeutic drug level monitoring: Secondary | ICD-10-CM | POA: Diagnosis not present

## 2018-01-12 LAB — POCT INR: INR: 2 (ref 2.0–3.0)

## 2018-01-12 NOTE — Patient Instructions (Signed)
Description   Continue taking 1 tablet daily except 1.5 tablets on Sundays and Wednesdays. Recheck in 6 weeks. Call us with any medication changes [phone]

## 2018-02-23 ENCOUNTER — Ambulatory Visit: Payer: Medicare Other | Admitting: Pharmacist

## 2018-02-23 DIAGNOSIS — I6789 Other cerebrovascular disease: Secondary | ICD-10-CM | POA: Diagnosis not present

## 2018-02-23 DIAGNOSIS — Z5181 Encounter for therapeutic drug level monitoring: Secondary | ICD-10-CM

## 2018-02-23 DIAGNOSIS — I4891 Unspecified atrial fibrillation: Secondary | ICD-10-CM | POA: Diagnosis not present

## 2018-02-23 LAB — POCT INR: INR: 2.3 (ref 2.0–3.0)

## 2018-02-23 NOTE — Patient Instructions (Signed)
Description   Continue taking 1 tablet daily except 1.5 tablets on Sundays and Wednesdays. Recheck in 6 weeks. Call us with any medication changes [phone]

## 2018-03-13 ENCOUNTER — Ambulatory Visit (INDEPENDENT_AMBULATORY_CARE_PROVIDER_SITE_OTHER): Payer: Medicare Other

## 2018-03-13 ENCOUNTER — Encounter (INDEPENDENT_AMBULATORY_CARE_PROVIDER_SITE_OTHER): Payer: Self-pay | Admitting: Orthopaedic Surgery

## 2018-03-13 ENCOUNTER — Other Ambulatory Visit (INDEPENDENT_AMBULATORY_CARE_PROVIDER_SITE_OTHER): Payer: Self-pay

## 2018-03-13 ENCOUNTER — Ambulatory Visit (INDEPENDENT_AMBULATORY_CARE_PROVIDER_SITE_OTHER): Payer: Medicare Other | Admitting: Orthopaedic Surgery

## 2018-03-13 DIAGNOSIS — M25512 Pain in left shoulder: Secondary | ICD-10-CM | POA: Diagnosis not present

## 2018-03-13 DIAGNOSIS — M25551 Pain in right hip: Secondary | ICD-10-CM

## 2018-03-13 DIAGNOSIS — G8929 Other chronic pain: Secondary | ICD-10-CM

## 2018-03-13 NOTE — Progress Notes (Signed)
Office Visit Note   Patient: [name]           Date of Birth: 06/26/1930           MRN: [id number] Visit Date: 03/13/2018              Requested by: Gaynelle Arabian, MD [address] [place], [place] [address] PCP: Gaynelle Arabian, MD   Assessment & Plan: Visit Diagnoses:  1. Left shoulder pain, unspecified chronicity   2. Pain in right hip     Plan: Encouraged him to work on range of motion of the left shoulder.  We will also send him for an intra-articular injection of the left shoulder.  Status post injection today subacromial of the left shoulder he did have some relief from the pain. In regards to his hip discussed with him the findings of the radiograph that showed the hip to be well-seated no acute fractures no bony abnormalities.  Recommend he work on stretching both hips also quad strengthening exercises.  He will follow-up with [place] on as-needed basis pain persist or comes worse.  Follow-Up Instructions: Return if symptoms worsen or fail to improve.   Orders:  Orders Placed This Encounter  Procedures  . XR Shoulder Left  . XR HIP UNILAT W OR W/O PELVIS 1V RIGHT   No orders of the defined types were placed in this encounter.     Procedures: No procedures performed   Clinical Data: No additional findings.   Subjective: Chief Complaint  Patient presents with  . Left Shoulder - Pain    HPI [name] is a 85 year old male comes in today with left shoulder pain.  He states he has had left shoulder pain for several months now.  He wakens in.  It radiates down to his left elbow.  No numbness tingling.  Is been taking Tylenol he notes decreased range of motion of the shoulder.  He has had multiple falls does not feel that he actually injured the shoulder.  He states he falls just due to decreased range of motion of the right hip and is wanting this checked.  Had a right total hip done by Dr. Ninfa Linden in approximately 2007.  Having no real groin pain  just stiffness of the hip.  No radicular symptoms down the leg. Review of Systems Please see HPI otherwise negative  Objective: Vital Signs: There were no vitals taken for this visit.  Physical Exam Constitutional:      Appearance: He is not ill-appearing or diaphoretic.  Pulmonary:     Effort: Pulmonary effort is normal.  Neurological:     Mental Status: He is alert and oriented to person, place, and time.     Ortho Exam Left shoulder forward flexion to 90 degrees.  Decreased external rotation.  5 out of 5 strength external and internal rotation against resistance.  Good range of motion of the left elbow forearm wrist hand.  Full sensation throughout left hand. Bilateral hips good limited external and internal rotation of both hips without pain.  Ambulates with the assistance of a walker. Specialty Comments:  No specialty comments available.  Imaging: Xr Hip Unilat W Or W/o Pelvis 1v Right  Result Date: 03/13/2018 AP pelvis and lateral view of the right hip: Status post right total hip arthroplasty hips well located.  Arthroplasty components are well-seated no signs of loosening or hardware failure.  No acute fractures.  Xr Shoulder Left  Result Date: 03/13/2018 Left shoulder 3  views AP lateral oblique shows severe end-stage arthritis with spurring off the humeral head.  Loss of subacromial space with calcification of the rotator cuff noted.  No acute fractures.  Shoulders well located.    PMFS History: Patient Active Problem List   Diagnosis Date Noted  . Subdural hematoma ([place]) 10/22/2015  . Thrombocytopenia ([place]) 10/22/2015  . Chronic venous stasis dermatitis 10/22/2015  . Fall   . Head injury   . Essential hypertension 01/21/2014  . Embolic stroke ([place]) 69/48/5462  . Long term current use of anticoagulant therapy 12/25/2012  . Atrial fibrillation ([place]) 10/15/2012  . Acute, but ill-defined, cerebrovascular disease 10/15/2012   Past Medical History:  Diagnosis Date    . Atrial fibrillation ([place]) 10/15/2012  . Cancer (HCC)    Skin (R hand)  . Embolic stroke ([place]) 70/35/0093  . Essential hypertension 01/21/2014  . Long term current use of anticoagulant therapy 12/25/2012    Family History  Problem Relation Age of Onset  . Other Mother        Brain hemmorrhage  . Other Father        unknown    Past Surgical History:  Procedure Laterality Date  . APPENDECTOMY    . CARDIAC CATHETERIZATION    . CYSTOSCOPY WITH INSERTION OF UROLIFT N/A 10/10/2017   Procedure: CYSTOSCOPY WITH INSERTION OF UROLIFT;  Surgeon: [name], MD;  Location: [facility];  Service: Urology;  Laterality: N/A;  NEEDS 60 MIN  . EYE SURGERY Bilateral 2013   Cataracts   Social History   Occupational History  . Not on file  Tobacco Use  . Smoking status: Former Smoker    Packs/day: 0.25    Years: 10.00    Pack years: 2.50    Types: Cigarettes  . Smokeless tobacco: Never Used  Substance and Sexual Activity  . Alcohol use: No    Alcohol/week: 0.0 standard drinks  . Drug use: No  . Sexual activity: Not on file

## 2018-03-27 ENCOUNTER — Other Ambulatory Visit: Payer: Self-pay | Admitting: Interventional Cardiology

## 2018-04-04 ENCOUNTER — Telehealth: Payer: Self-pay

## 2018-04-04 NOTE — Telephone Encounter (Signed)

## 2018-04-06 ENCOUNTER — Ambulatory Visit (INDEPENDENT_AMBULATORY_CARE_PROVIDER_SITE_OTHER): Payer: Medicare Other | Admitting: Pharmacist

## 2018-04-06 ENCOUNTER — Other Ambulatory Visit: Payer: Self-pay

## 2018-04-06 DIAGNOSIS — I4891 Unspecified atrial fibrillation: Secondary | ICD-10-CM | POA: Diagnosis not present

## 2018-04-06 DIAGNOSIS — Z5181 Encounter for therapeutic drug level monitoring: Secondary | ICD-10-CM | POA: Diagnosis not present

## 2018-04-06 DIAGNOSIS — I6789 Other cerebrovascular disease: Secondary | ICD-10-CM | POA: Diagnosis not present

## 2018-04-06 LAB — POCT INR: INR: 2.5 (ref 2.0–3.0)

## 2018-04-06 NOTE — Patient Instructions (Signed)
Description   Continue taking 1 tablet daily except 1.5 tablets on Sundays and Wednesdays. Recheck in 8 weeks. We discussed transitioning to a DOAC, they will continue to think about it. Call us with any medication changes [phone]

## 2018-04-30 ENCOUNTER — Ambulatory Visit (INDEPENDENT_AMBULATORY_CARE_PROVIDER_SITE_OTHER): Payer: Self-pay | Admitting: Physical Medicine and Rehabilitation

## 2018-05-23 ENCOUNTER — Ambulatory Visit: Payer: Medicare Other | Admitting: Physical Medicine and Rehabilitation

## 2018-05-23 ENCOUNTER — Other Ambulatory Visit: Payer: Self-pay

## 2018-05-23 ENCOUNTER — Ambulatory Visit: Payer: Medicare Other

## 2018-05-23 ENCOUNTER — Encounter: Payer: Self-pay | Admitting: Physical Medicine and Rehabilitation

## 2018-05-23 DIAGNOSIS — M25512 Pain in left shoulder: Secondary | ICD-10-CM | POA: Diagnosis not present

## 2018-05-23 DIAGNOSIS — G8929 Other chronic pain: Secondary | ICD-10-CM

## 2018-05-23 NOTE — Progress Notes (Signed)
 .  Numeric Pain Rating Scale and Functional Assessment Average Pain 10   In the last MONTH (on 0-10 scale) has pain interfered with the following?  1. General activity like being  able to carry out your everyday physical activities such as walking, climbing stairs, carrying groceries, or moving a chair?  Rating(8)   -Dye Allergies.  

## 2018-05-23 NOTE — Progress Notes (Signed)
   [name] - 85 y.o. male MRN [id number]  Date of birth: Sep 07, 1930  Office Visit Note: Visit Date: 05/23/2018 PCP: Gaynelle Arabian, MD Referred by: Gaynelle Arabian, MD  Subjective: Chief Complaint  Patient presents with  . Left Shoulder - Pain  . Left Upper Arm - Pain   HPI:  [name] is a 85 y.o. male who comes in today At the request of Dr. Jean Rosenthal for left anesthetic and diagnostic and therapeutic shoulder arthrogram.  Patient having worsening left shoulder pain and decreased range of motion.  Prior hip replacement by Dr. Ninfa Linden.  Some pain radiating into the left upper arm but nothing past the elbow and no paresthesia.  Ongoing for 6 months without help with conservative care.  Average pain is 10 out of 10.  ROS Otherwise per HPI.  Assessment & Plan: Visit Diagnoses:  1. Chronic left shoulder pain     Plan: No additional findings.   Meds & Orders: No orders of the defined types were placed in this encounter.   Orders Placed This Encounter  Procedures  . Large Joint Inj: L glenohumeral  . XR C-ARM NO REPORT    Follow-up: Return if symptoms worsen or fail to improve.   Procedures: Large Joint Inj: L glenohumeral on 05/23/2018 9:44 AM Indications: pain and diagnostic evaluation Details: 22 G 3.5 in needle, anteromedial approach  Arthrogram: Yes  Medications: 3 mL bupivacaine 0.5 %; 80 mg triamcinolone acetonide 40 MG/ML  Arthrogram demonstrated excellent flow of contrast throughout the joint surface without extravasation or obvious defect.  The patient had relief of symptoms during the anesthetic phase of the injection.  Procedure, treatment alternatives, risks and benefits explained, specific risks discussed. Consent was given by the patient. Immediately prior to procedure a time out was called to verify the correct patient, procedure, equipment, support staff and site/side marked as required. Patient was prepped and draped in the usual sterile  fashion.      No notes on file   Clinical History: No specialty comments available.     Objective:  VS:  HT:    WT:   BMI:     BP:   HR: bpm  TEMP: ( )  RESP:  Physical Exam  Ortho Exam Imaging: No results found.

## 2018-05-30 ENCOUNTER — Telehealth: Payer: Self-pay

## 2018-05-30 NOTE — Telephone Encounter (Signed)

## 2018-05-31 MED ORDER — BUPIVACAINE HCL 0.5 % IJ SOLN
3.0000 mL | INTRAMUSCULAR | Status: AC | PRN
  Administered 2018-05-23: 3 mL via INTRA_ARTICULAR

## 2018-05-31 MED ORDER — TRIAMCINOLONE ACETONIDE 40 MG/ML IJ SUSP
80.0000 mg | INTRAMUSCULAR | Status: AC | PRN
  Administered 2018-05-23: 80 mg via INTRA_ARTICULAR

## 2018-06-01 ENCOUNTER — Other Ambulatory Visit: Payer: Self-pay

## 2018-06-01 ENCOUNTER — Ambulatory Visit (INDEPENDENT_AMBULATORY_CARE_PROVIDER_SITE_OTHER): Payer: Medicare Other | Admitting: Pharmacist

## 2018-06-01 DIAGNOSIS — I4891 Unspecified atrial fibrillation: Secondary | ICD-10-CM | POA: Diagnosis not present

## 2018-06-01 DIAGNOSIS — Z5181 Encounter for therapeutic drug level monitoring: Secondary | ICD-10-CM | POA: Diagnosis not present

## 2018-06-01 DIAGNOSIS — I6789 Other cerebrovascular disease: Secondary | ICD-10-CM | POA: Diagnosis not present

## 2018-06-01 LAB — POCT INR: INR: 2.3 (ref 2.0–3.0)

## 2018-06-07 ENCOUNTER — Encounter (HOSPITAL_COMMUNITY): Payer: Self-pay | Admitting: *Deleted

## 2018-06-07 ENCOUNTER — Emergency Department (HOSPITAL_COMMUNITY)
Admission: EM | Admit: 2018-06-07 | Discharge: 2018-06-07 | Disposition: A | Discharge status: Home / Self Care | Payer: Medicare Other | Attending: Emergency Medicine | Admitting: Emergency Medicine

## 2018-06-07 ENCOUNTER — Other Ambulatory Visit: Payer: Self-pay

## 2018-06-07 ENCOUNTER — Emergency Department (HOSPITAL_COMMUNITY): Payer: Medicare Other

## 2018-06-07 DIAGNOSIS — W1830XA Fall on same level, unspecified, initial encounter: Secondary | ICD-10-CM | POA: Insufficient documentation

## 2018-06-07 DIAGNOSIS — M25422 Effusion, left elbow: Secondary | ICD-10-CM | POA: Insufficient documentation

## 2018-06-07 DIAGNOSIS — Z7982 Long term (current) use of aspirin: Secondary | ICD-10-CM | POA: Insufficient documentation

## 2018-06-07 DIAGNOSIS — Z87891 Personal history of nicotine dependence: Secondary | ICD-10-CM | POA: Insufficient documentation

## 2018-06-07 DIAGNOSIS — Y9301 Activity, walking, marching and hiking: Secondary | ICD-10-CM | POA: Diagnosis not present

## 2018-06-07 DIAGNOSIS — Z85828 Personal history of other malignant neoplasm of skin: Secondary | ICD-10-CM | POA: Diagnosis not present

## 2018-06-07 DIAGNOSIS — Z7901 Long term (current) use of anticoagulants: Secondary | ICD-10-CM | POA: Insufficient documentation

## 2018-06-07 DIAGNOSIS — I4891 Unspecified atrial fibrillation: Secondary | ICD-10-CM | POA: Diagnosis not present

## 2018-06-07 DIAGNOSIS — Y999 Unspecified external cause status: Secondary | ICD-10-CM | POA: Diagnosis not present

## 2018-06-07 DIAGNOSIS — Z79899 Other long term (current) drug therapy: Secondary | ICD-10-CM | POA: Insufficient documentation

## 2018-06-07 DIAGNOSIS — I10 Essential (primary) hypertension: Secondary | ICD-10-CM | POA: Diagnosis not present

## 2018-06-07 DIAGNOSIS — S59902A Unspecified injury of left elbow, initial encounter: Secondary | ICD-10-CM

## 2018-06-07 DIAGNOSIS — M25522 Pain in left elbow: Secondary | ICD-10-CM | POA: Diagnosis present

## 2018-06-07 DIAGNOSIS — Y929 Unspecified place or not applicable: Secondary | ICD-10-CM | POA: Diagnosis not present

## 2018-06-07 MED ORDER — ACETAMINOPHEN 325 MG PO TABS
650.0000 mg | ORAL_TABLET | Freq: Once | ORAL | Status: AC
  Administered 2018-06-07: 650 mg via ORAL
  Filled 2018-06-07: qty 2

## 2018-06-07 NOTE — ED Provider Notes (Signed)
[facility] DEPART[facility] Provider Note   CSN: [id number] Arrival date & time: 06/07/18  1330    History   Chief Complaint Chief Complaint  Patient presents with  . Fall    HPI [name] is a 85 y.o. male.     HPI   85 year old male presents status post fall.  Patient notes that yesterday he fell while outside.  He notes this was a mechanical fall.  He landed on his left elbow.  No swelling to the elbow.  He notes full active range of motion but has pain with both flexion and extension.  No other injuries, no head trauma, no neck chest back or hip pain.  He is taking Tylenol yesterday, none today.  Past Medical History:  Diagnosis Date  . Atrial fibrillation ([place]) 10/15/2012  . Cancer (HCC)    Skin (R hand)  . Embolic stroke ([place]) 37/62/8315  . Essential hypertension 01/21/2014  . Long term current use of anticoagulant therapy 12/25/2012    Patient Active Problem List   Diagnosis Date Noted  . Subdural hematoma ([place]) 10/22/2015  . Thrombocytopenia ([place]) 10/22/2015  . Chronic venous stasis dermatitis 10/22/2015  . Fall   . Head injury   . Essential hypertension 01/21/2014  . Embolic stroke ([place]) 17/61/6073  . Long term current use of anticoagulant therapy 12/25/2012  . Atrial fibrillation ([place]) 10/15/2012  . Acute, but ill-defined, cerebrovascular disease 10/15/2012    Past Surgical History:  Procedure Laterality Date  . APPENDECTOMY    . CARDIAC CATHETERIZATION    . CYSTOSCOPY WITH INSERTION OF UROLIFT N/A 10/10/2017   Procedure: CYSTOSCOPY WITH INSERTION OF UROLIFT;  Surgeon: [name], MD;  Location: [facility];  Service: Urology;  Laterality: N/A;  NEEDS 60 MIN  . EYE SURGERY Bilateral 2013   Cataracts        Home Medications    Prior to Admission medications   Medication Sig Start Date End Date Taking? Authorizing Provider  acetaminophen (TYLENOL) 500 MG tablet Take 500-1,000 mg by mouth every 6 (six) hours as needed  (FOR PAIN.).    [provider]  aspirin EC 81 MG tablet Take 81 mg by mouth at bedtime.    [provider]  calcium carbonate (OS-CAL) 600 MG TABS tablet Take 600 mg by mouth daily.     [provider]  enoxaparin (LOVENOX) 100 MG/ML injection Inject 1 mL (100 mg total) into the skin every 12 (twelve) hours. 10/03/17   Belva Crome, MD  finasteride (PROSCAR) 5 MG tablet Take 5 mg by mouth daily.    [provider]  Glucosamine HCl (GLUCOSAMINE PO) Take 1,500 mg by mouth daily.     [provider]  hydrochlorothiazide (HYDRODIURIL) 12.5 MG tablet Take 1 tablet (12.5 mg total) by mouth daily. 07/03/17   Belva Crome, MD  lisinopril (PRINIVIL,ZESTRIL) 10 MG tablet Take 10 mg by mouth daily.    [provider]  metoprolol succinate (TOPROL-XL) 50 MG 24 hr tablet TAKE 1 TABLET BY MOUTH ONCE DAILY Patient taking differently: Take 50 mg by mouth daily.  05/29/17   Belva Crome, MD  Multiple Vitamin (MULTIVITAMIN) tablet Take 1 tablet by mouth daily.    [provider]  Multiple Vitamins-Minerals (PRESERVISION AREDS 2) CAPS Take 1 capsule by mouth 2 (two) times daily.    [provider]  tamsulosin (FLOMAX) 0.4 MG CAPS capsule Take 0.4 mg by mouth every evening.     [provider]  warfarin (COUMADIN) 5 MG tablet TAKE AS DIRECTED PER ANTICOAGULATION CLINIC 03/27/18   Belva Crome, MD    Family History Family History  Problem Relation Age of Onset  . Other Mother        Brain hemmorrhage  . Other Father        unknown    Social History Social History   Tobacco Use  . Smoking status: Former Smoker    Packs/day: 0.25    Years: 10.00    Pack years: 2.50    Types: Cigarettes  . Smokeless tobacco: Never Used  Substance Use Topics  . Alcohol use: No    Alcohol/week: 0.0 standard drinks  . Drug use: No     Allergies   Patient has no known allergies.   Review of Systems Review of Systems  All other  systems reviewed and are negative.    Physical Exam Updated Vital Signs BP (!) 146/71 (BP Location: Right Arm)   Pulse 90   Temp 98.6 F (37 C) (Oral)   Resp 16   SpO2 95%   Physical Exam Vitals signs and nursing note reviewed.  Constitutional:      Appearance: He is well-developed.  HENT:     Head: Normocephalic and atraumatic.  Eyes:     General: No scleral icterus.       Right eye: No discharge.        Left eye: No discharge.     Conjunctiva/sclera: Conjunctivae normal.     Pupils: Pupils are equal, round, and reactive to light.  Neck:     Musculoskeletal: Normal range of motion.     Vascular: No JVD.     Trachea: No tracheal deviation.  Pulmonary:     Effort: Pulmonary effort is normal.     Breath sounds: No stridor.  Musculoskeletal:     Comments: Left elbow with minor effusion, no open wounds, tenderness throughout and about the distal humerus, remainder of extremity nontender, grip strength 5/5 sensation intact radial pulse 2+ --no CT or L-spine tenderness palpation  Neurological:     Mental Status: He is alert and oriented to person, place, and time.     Coordination: Coordination normal.  Psychiatric:        Behavior: Behavior normal.        Thought Content: Thought content normal.        Judgment: Judgment normal.      ED Treatments / Results  Labs (all labs ordered are listed, but only abnormal results are displayed) Labs Reviewed - No data to display  EKG None  Radiology Dg Elbow Complete Left  Result Date: 06/07/2018 CLINICAL DATA:  Left elbow pain and limited range of motion since fall two days ago. EXAM: LEFT ELBOW - COMPLETE 3+ VIEW COMPARISON:  None. FINDINGS: No acute fracture or dislocation. No joint effusion. Mild degenerative changes and marginal spurring. Bone mineralization is normal. Soft tissues are unremarkable. IMPRESSION: No acute osseous abnormality. Electronically Signed   By: Titus Dubin M.D.   On: 06/07/2018 14:37     Procedures Procedures (including critical care time)  Medications Ordered in ED Medications  acetaminophen (TYLENOL) tablet 650 mg (650 mg Oral Given 06/07/18 1403)     Initial Impression / Assessment and Plan / ED Course  I have reviewed the triage vital signs and the nursing notes.  Pertinent labs & imaging results that were available during my care of the patient were reviewed by me and considered in my medical  decision making (see chart for details).        85 year old male with left elbow pain status post fall.  He does have swelling on the elbow but no acute fractures noted on plain films.  Patient could have occult fracture not seen, he has good range of motion.  He be placed in sling encouraged to mobilize early.  Follow-up with orthopedic specialist symptoms persist return immediately with any new or worsening signs or symptoms.  He verbalized understanding and agreement to today's plan.   Final Clinical Impressions(s) / ED Diagnoses   Final diagnoses:  Injury of left elbow, initial encounter    ED Discharge Orders    None       Francee Gentile 06/07/18 1517    Elnora Morrison, MD 06/07/18 [phone]

## 2018-06-07 NOTE — Discharge Instructions (Addendum)
Please read attached information. If you experience any new or worsening signs or symptoms please return to the emergency room for evaluation.  As we discussed please make sure that you are doing range of motion exercises.  Please follow-up with your primary care provider or specialist as discussed.  Please use Tylenol as needed for pain.

## 2018-06-07 NOTE — ED Triage Notes (Addendum)
Pt reports walking on Tuesday and fell on [place]. Pt noted to have swelling to left elbow.

## 2018-06-07 NOTE — ED Notes (Signed)
Discharge instructions discussed with Pt. Pt verbalized understanding. Pt stable and leaving with family via [place].

## 2018-07-16 ENCOUNTER — Other Ambulatory Visit: Payer: Self-pay | Admitting: Interventional Cardiology

## 2018-07-24 ENCOUNTER — Telehealth: Payer: Self-pay

## 2018-07-24 NOTE — Telephone Encounter (Signed)

## 2018-07-27 ENCOUNTER — Other Ambulatory Visit: Payer: Self-pay

## 2018-07-27 ENCOUNTER — Ambulatory Visit (INDEPENDENT_AMBULATORY_CARE_PROVIDER_SITE_OTHER): Payer: Medicare Other

## 2018-07-27 DIAGNOSIS — I4891 Unspecified atrial fibrillation: Secondary | ICD-10-CM

## 2018-07-27 DIAGNOSIS — I6789 Other cerebrovascular disease: Secondary | ICD-10-CM | POA: Diagnosis not present

## 2018-07-27 DIAGNOSIS — Z5181 Encounter for therapeutic drug level monitoring: Secondary | ICD-10-CM | POA: Diagnosis not present

## 2018-07-27 LAB — POCT INR: INR: 3.4 — AB (ref 2.0–3.0)

## 2018-07-27 NOTE — Patient Instructions (Signed)
Description   Skip today's dosage of Coumadin, then resume same dosage 1 tablet daily except 1.5 tablets on Sundays and Wednesdays. Recheck in 4 weeks. Call us with any medication changes [phone]

## 2018-08-24 ENCOUNTER — Ambulatory Visit (INDEPENDENT_AMBULATORY_CARE_PROVIDER_SITE_OTHER): Payer: Medicare Other | Admitting: Pharmacist

## 2018-08-24 ENCOUNTER — Other Ambulatory Visit: Payer: Self-pay

## 2018-08-24 DIAGNOSIS — I4891 Unspecified atrial fibrillation: Secondary | ICD-10-CM

## 2018-08-24 DIAGNOSIS — I6789 Other cerebrovascular disease: Secondary | ICD-10-CM | POA: Diagnosis not present

## 2018-08-24 DIAGNOSIS — Z5181 Encounter for therapeutic drug level monitoring: Secondary | ICD-10-CM | POA: Diagnosis not present

## 2018-08-24 LAB — POCT INR: INR: 3.2 — AB (ref 2.0–3.0)

## 2018-08-24 NOTE — Patient Instructions (Signed)
Take 1/2 tablet tonight then resume same dosage 1 tablet daily except 1.5 tablets on Sundays and Wednesdays. Recheck in 3 weeks. Call us with any medication changes [phone]

## 2018-09-12 ENCOUNTER — Ambulatory Visit (INDEPENDENT_AMBULATORY_CARE_PROVIDER_SITE_OTHER): Payer: Medicare Other

## 2018-09-12 ENCOUNTER — Other Ambulatory Visit: Payer: Self-pay

## 2018-09-12 DIAGNOSIS — I4891 Unspecified atrial fibrillation: Secondary | ICD-10-CM | POA: Diagnosis not present

## 2018-09-12 DIAGNOSIS — I6789 Other cerebrovascular disease: Secondary | ICD-10-CM | POA: Diagnosis not present

## 2018-09-12 DIAGNOSIS — Z5181 Encounter for therapeutic drug level monitoring: Secondary | ICD-10-CM

## 2018-09-12 LAB — POCT INR: INR: 2.9 (ref 2.0–3.0)

## 2018-09-12 NOTE — Patient Instructions (Signed)
Description   Continue on same dosage 1 tablet daily except 1.5 tablets on Sundays and Wednesdays. Recheck in 4 weeks. Call us with any medication changes [phone]

## 2018-10-10 ENCOUNTER — Other Ambulatory Visit: Payer: Self-pay

## 2018-10-10 ENCOUNTER — Ambulatory Visit (INDEPENDENT_AMBULATORY_CARE_PROVIDER_SITE_OTHER): Payer: Medicare Other

## 2018-10-10 DIAGNOSIS — I4891 Unspecified atrial fibrillation: Secondary | ICD-10-CM | POA: Diagnosis not present

## 2018-10-10 DIAGNOSIS — I6789 Other cerebrovascular disease: Secondary | ICD-10-CM | POA: Diagnosis not present

## 2018-10-10 DIAGNOSIS — Z5181 Encounter for therapeutic drug level monitoring: Secondary | ICD-10-CM | POA: Diagnosis not present

## 2018-10-10 LAB — POCT INR: INR: 3.2 — AB (ref 2.0–3.0)

## 2018-10-10 NOTE — Patient Instructions (Signed)
Description   Skip today's dosage of Coumadin, then start taking 1 tablet daily except 1.5 tablets on Sundays. Recheck in 4 weeks. Call us with any medication changes [phone]

## 2018-10-30 ENCOUNTER — Other Ambulatory Visit: Payer: Self-pay | Admitting: Interventional Cardiology

## 2018-11-07 ENCOUNTER — Ambulatory Visit (INDEPENDENT_AMBULATORY_CARE_PROVIDER_SITE_OTHER): Payer: Medicare Other | Admitting: *Deleted

## 2018-11-07 ENCOUNTER — Other Ambulatory Visit: Payer: Self-pay

## 2018-11-07 DIAGNOSIS — Z5181 Encounter for therapeutic drug level monitoring: Secondary | ICD-10-CM

## 2018-11-07 DIAGNOSIS — I4891 Unspecified atrial fibrillation: Secondary | ICD-10-CM | POA: Diagnosis not present

## 2018-11-07 DIAGNOSIS — I6789 Other cerebrovascular disease: Secondary | ICD-10-CM

## 2018-11-07 LAB — POCT INR: INR: 2.8 (ref 2.0–3.0)

## 2018-11-07 NOTE — Patient Instructions (Signed)
Description   Continue taking 1 tablet daily except 1.5 tablets on Sundays. Recheck in 5 weeks. Call us with any medication changes [phone]

## 2018-12-12 ENCOUNTER — Other Ambulatory Visit: Payer: Self-pay

## 2018-12-12 ENCOUNTER — Ambulatory Visit: Payer: Medicare Other

## 2018-12-12 DIAGNOSIS — Z5181 Encounter for therapeutic drug level monitoring: Secondary | ICD-10-CM | POA: Diagnosis not present

## 2018-12-12 DIAGNOSIS — I6789 Other cerebrovascular disease: Secondary | ICD-10-CM | POA: Diagnosis not present

## 2018-12-12 DIAGNOSIS — I4891 Unspecified atrial fibrillation: Secondary | ICD-10-CM | POA: Diagnosis not present

## 2018-12-12 LAB — POCT INR: INR: 2.3 (ref 2.0–3.0)

## 2018-12-12 NOTE — Patient Instructions (Signed)
Description   Continue taking 1 tablet daily except 1.5 tablets on Sundays. Recheck in 6 weeks. Call us with any medication changes [phone]

## 2019-01-21 ENCOUNTER — Telehealth: Payer: Self-pay | Admitting: Interventional Cardiology

## 2019-01-21 NOTE — Telephone Encounter (Signed)
New message   Patient's wife states that she will come with patient to Dr. Tamala Julian Appt he has trouble walking and does not understand information. Please advise.

## 2019-01-22 NOTE — Telephone Encounter (Signed)
Note placed in chart, ok for wife to accompany

## 2019-01-23 NOTE — Progress Notes (Signed)
Cardiology Office Note:    Date:  01/24/2019   ID:  DEMARRIUS Mcdonald, DOB 01/19/1930, MRN [id number]  PCP:  Gaynelle Arabian, MD  Cardiologist:  Sinclair Grooms, MD   Referring MD: Gaynelle Arabian, MD   Chief Complaint  Patient presents with  . Atrial Fibrillation    History of Present Illness:    [name] is a 86 y.o. male with a hx of chronic atrial fibrillation, long-term anticoagulation therapy, history of embolic CVA, and essential hypertension.  He is scheduled to get the COVID-19 vaccination.  He is accompanied by his son.  He was able to walk in independently.  He has been sedentary due to the COVID-19 pandemic.  He has no complaints.  He has not had chest pain, palpitations, lightheadedness, dizziness, or syncope.  Past Medical History:  Diagnosis Date  . Atrial fibrillation ([place]) 10/15/2012  . Cancer (HCC)    Skin (R hand)  . Embolic stroke ([place]) [id number]  . Essential hypertension 01/21/2014  . Long term current use of anticoagulant therapy 12/25/2012    Past Surgical History:  Procedure Laterality Date  . APPENDECTOMY    . CARDIAC CATHETERIZATION    . CYSTOSCOPY WITH INSERTION OF UROLIFT N/A 10/10/2017   Procedure: CYSTOSCOPY WITH INSERTION OF UROLIFT;  Surgeon: [name], MD;  Location: [facility];  Service: Urology;  Laterality: N/A;  NEEDS 60 MIN  . EYE SURGERY Bilateral 2013   Cataracts    Current Medications: Current Meds  Medication Sig  . acetaminophen (TYLENOL) 500 MG tablet Take 500-1,000 mg by mouth every 6 (six) hours as needed (FOR PAIN.).  Marland Kitchen aspirin EC 81 MG tablet Take 81 mg by mouth at bedtime.  . calcium carbonate (OS-CAL) 600 MG TABS tablet Take 600 mg by mouth daily.   . finasteride (PROSCAR) 5 MG tablet Take 5 mg by mouth daily.  . Glucosamine HCl (GLUCOSAMINE PO) Take 1,500 mg by mouth daily.   . hydrochlorothiazide (HYDRODIURIL) 12.5 MG tablet Take 1 tablet (12.5 mg total) by mouth daily.  Marland Kitchen lisinopril (PRINIVIL,ZESTRIL) 10  MG tablet Take 10 mg by mouth daily.  . metoprolol succinate (TOPROL-XL) 50 MG 24 hr tablet TAKE 1 TABLET BY MOUTH ONCE DAILY  . Multiple Vitamin (MULTIVITAMIN) tablet Take 1 tablet by mouth daily.  . Multiple Vitamins-Minerals (PRESERVISION AREDS 2) CAPS Take 1 capsule by mouth 2 (two) times daily.  . tamsulosin (FLOMAX) 0.4 MG CAPS capsule Take 0.4 mg by mouth every evening.   . warfarin (COUMADIN) 5 MG tablet TAKE AS DIRECTED PER ANTICOAGULATION CLINIC     Allergies:   Patient has no known allergies.   Social History   Socioeconomic History  . Marital status: Married    Spouse name: Not on file  . Number of children: Not on file  . Years of education: Not on file  . Highest education level: Not on file  Occupational History  . Not on file  Tobacco Use  . Smoking status: Former Smoker    Packs/day: 0.25    Years: 10.00    Pack years: 2.50    Types: Cigarettes  . Smokeless tobacco: Never Used  Substance and Sexual Activity  . Alcohol use: No    Alcohol/week: 0.0 standard drinks  . Drug use: No  . Sexual activity: Not on file  Other Topics Concern  . Not on file  Social History Narrative  . Not on file   Social Determinants of Health   Financial Resource Strain:   .  Difficulty of Paying Living Expenses: Not on file  Food Insecurity:   . Worried About Charity fundraiser in the Last Year: Not on file  . Ran Out of Food in the Last Year: Not on file  Transportation Needs:   . Lack of Transportation (Medical): Not on file  . Lack of Transportation (Non-Medical): Not on file  Physical Activity:   . Days of Exercise per Week: Not on file  . Minutes of Exercise per Session: Not on file  Stress:   . Feeling of Stress : Not on file  Social Connections:   . Frequency of Communication with Friends and Family: Not on file  . Frequency of Social Gatherings with Friends and Family: Not on file  . Attends Religious Services: Not on file  . Active Member of Clubs or  Organizations: Not on file  . Attends Archivist Meetings: Not on file  . Marital Status: Not on file     Family History: The patient's family history includes Other in his father and mother.  ROS:   Please see the history of present illness.    INR is checked at heart care, [facility].  He has not had blood in his urine or stool.  There have been no new neurological complaints.  Memory is decreasing according to son.  All other systems reviewed and are negative.  EKGs/Labs/Other Studies Reviewed:    The following studies were reviewed today: No new data  EKG:  EKG atrial fibrillation with rapid ventricular response.  The tracing is otherwise normal..  When compared to prior, the rate is slightly faster.  Recent Labs: No results found for requested labs within last 8760 hours.  Recent Lipid Panel No results found for: CHOL, TRIG, HDL, CHOLHDL, VLDL, LDLCALC, LDLDIRECT  Physical Exam:    VS:  BP 128/76   Pulse (!) 106   Ht 5\' 10"  (1.778 m)   Wt 216 lb (98 kg)   SpO2 93%   BMI 30.99 kg/m     Wt Readings from Last 3 Encounters:  01/24/19 216 lb (98 kg)  11/13/17 213 lb 12.8 oz (97 kg)  10/10/17 210 lb 2 oz (95.3 kg)     GEN: Has gained weight. No acute distress HEENT: Normal NECK: No JVD. LYMPHATICS: No lymphadenopathy CARDIAC: Irregularly irregular RR without murmur, gallop, or edema. VASCULAR:  Normal Pulses. No bruits. RESPIRATORY:  Clear to auscultation without rales, wheezing or rhonchi  ABDOMEN: Soft, non-tender, non-distended, No pulsatile mass, MUSCULOSKELETAL: No deformity  SKIN: Warm and dry NEUROLOGIC:  Alert and oriented x 3 PSYCHIATRIC:  Normal affect   ASSESSMENT:    1. Chronic atrial fibrillation ([place])   2. Cerebrovascular accident (CVA) due to bilateral embolism of carotid arteries ([place])   3. Mixed hyperlipidemia   4. Long term current use of anticoagulant therapy   5. Essential hypertension   6. Educated about COVID-19 virus  infection    PLAN:    In order of problems listed above:  1. Borderline rate control.  Verify the metoprolol succinate is being taken at a dose of 50 mg/day.  If not, start Toprol-XL 50 mg/day.  If taking Toprol-XL at the dose listed above, we will increase to 75 mg/day. 2. No new data 3. Last LDL was 109.  He will be seeing Dr. Mikle Bosworth later this year for complete exam. 4. Coumad[facility] clinic at [facility].  2.6 today. 5. Blood pressure is excellent.  If we increase the dose  of Toprol, we need to ensure that his blood pressure does not decrease significantly. 6. 3W's and COVID-19 vaccine are endorsed as aspirations and initiatives.  He is scheduled to get the vaccine.   Plan clinical follow-up in 1 year.   Medication Adjustments/Labs and Tests Ordered: Current medicines are reviewed at length with the patient today.  Concerns regarding medicines are outlined above.  Orders Placed This Encounter  Procedures  . EKG 12-Lead   No orders of the defined types were placed in this encounter.   There are no Patient Instructions on file for this visit.   Signed, Sinclair Grooms, MD  01/24/2019 9:58 AM    [place]

## 2019-01-24 ENCOUNTER — Telehealth: Payer: Self-pay | Admitting: Interventional Cardiology

## 2019-01-24 ENCOUNTER — Ambulatory Visit: Payer: Medicare Other | Admitting: Interventional Cardiology

## 2019-01-24 ENCOUNTER — Ambulatory Visit (INDEPENDENT_AMBULATORY_CARE_PROVIDER_SITE_OTHER): Payer: Medicare Other | Admitting: *Deleted

## 2019-01-24 ENCOUNTER — Encounter: Payer: Self-pay | Admitting: Interventional Cardiology

## 2019-01-24 ENCOUNTER — Other Ambulatory Visit: Payer: Self-pay

## 2019-01-24 ENCOUNTER — Telehealth: Payer: Self-pay | Admitting: Pharmacy Technician

## 2019-01-24 VITALS — BP 128/76 | HR 106 | Ht 70.0 in | Wt 216.0 lb

## 2019-01-24 DIAGNOSIS — I482 Chronic atrial fibrillation, unspecified: Secondary | ICD-10-CM | POA: Diagnosis not present

## 2019-01-24 DIAGNOSIS — I4891 Unspecified atrial fibrillation: Secondary | ICD-10-CM

## 2019-01-24 DIAGNOSIS — I6789 Other cerebrovascular disease: Secondary | ICD-10-CM | POA: Diagnosis not present

## 2019-01-24 DIAGNOSIS — Z7901 Long term (current) use of anticoagulants: Secondary | ICD-10-CM

## 2019-01-24 DIAGNOSIS — Z7189 Other specified counseling: Secondary | ICD-10-CM

## 2019-01-24 DIAGNOSIS — I10 Essential (primary) hypertension: Secondary | ICD-10-CM

## 2019-01-24 DIAGNOSIS — I63133 Cerebral infarction due to embolism of bilateral carotid arteries: Secondary | ICD-10-CM | POA: Diagnosis not present

## 2019-01-24 DIAGNOSIS — E782 Mixed hyperlipidemia: Secondary | ICD-10-CM | POA: Diagnosis not present

## 2019-01-24 DIAGNOSIS — Z5181 Encounter for therapeutic drug level monitoring: Secondary | ICD-10-CM

## 2019-01-24 LAB — POCT INR: INR: 2.6 (ref 2.0–3.0)

## 2019-01-24 MED ORDER — METOPROLOL SUCCINATE ER 50 MG PO TB24: 50.0000 mg | ORAL_TABLET | Freq: Every day | ORAL | 3 refills | Status: DC

## 2019-01-24 MED ORDER — METOPROLOL SUCCINATE ER 25 MG PO TB24: ORAL_TABLET | ORAL | 3 refills | Status: DC

## 2019-01-24 NOTE — Patient Instructions (Signed)
Medication Instructions:  1) When you get home, please verify that you are taking Metoprolol Succinate 50mg  once daily.  Please call the office and let me know either way. If you are taking, we will have to increase your dose to 75mg  once daily.  *If you need a refill on your cardiac medications before your next appointment, please call your pharmacy*  Lab Work: None If you have labs (blood work) drawn today and your tests are completely normal, you will receive your results only by: Marland Kitchen MyChart Message (if you have MyChart) OR . A paper copy in the mail If you have any lab test that is abnormal or we need to change your treatment, we will call you to review the results.  Testing/Procedures: None  Follow-Up: At [facility], you and your health needs are our priority.  As part of our continuing mission to provide you with exceptional heart care, we have created designated Provider Care Teams.  These Care Teams include your primary Cardiologist (physician) and Advanced Practice Providers (APPs -  Physician Assistants and Nurse Practitioners) who all work together to provide you with the care you need, when you need it.  Your next appointment:   12 month(s)  The format for your next appointment:   In Person  Provider:   You may see Sinclair Grooms, MD or one of the following Advanced Practice Providers on your designated Care Team:    Truitt Merle, NP  Cecilie Kicks, NP  Kathyrn Drown, NP   Other Instructions

## 2019-01-24 NOTE — Addendum Note (Signed)
Addended by: Loren Racer on: 01/24/2019 01:32 PM   Modules accepted: Orders

## 2019-01-24 NOTE — Telephone Encounter (Signed)
Spoke with wife, DPR on file.  She states pt has been taking the Metoprolol Succ 50mg  everyday.  Advised we will need to increase to 75mg  then.  Advised wife to monitor BP and let us know if SBP is consistently below 120.

## 2019-01-24 NOTE — Telephone Encounter (Signed)
Follow Up:      Pt;s wife called back and wanted Dr Tamala Julian or his nurse to know that pt is on Metoprolol. Pt was seen by Dr Tamala Julian this morning.

## 2019-01-24 NOTE — Patient Instructions (Signed)
Description   Continue taking 1 tablet daily except 1.5 tablets on Sundays. Recheck in 6 weeks. Call us with any medication changes [phone]

## 2019-01-25 NOTE — Telephone Encounter (Signed)
Agree, Thanks! 

## 2019-02-04 ENCOUNTER — Other Ambulatory Visit: Payer: Self-pay | Admitting: Interventional Cardiology

## 2019-02-06 ENCOUNTER — Ambulatory Visit: Payer: Medicare Other

## 2019-02-15 ENCOUNTER — Ambulatory Visit: Payer: Medicare Other | Attending: Internal Medicine

## 2019-02-15 DIAGNOSIS — Z23 Encounter for immunization: Secondary | ICD-10-CM | POA: Insufficient documentation

## 2019-02-15 NOTE — Progress Notes (Signed)
   Covid-19 Vaccination Clinic  Name:  [name]    MRN: [id number] DOB: [birth year]  02/15/2019  [name] was observed post Covid-19 immunization for 15 minutes without incidence. He was provided with Vaccine Information Sheet and instruction to access the V-Safe system.   [name] was instructed to call 911 with any severe reactions post vaccine: Marland Kitchen Difficulty breathing  . Swelling of your face and throat  . A fast heartbeat  . A bad rash all over your body  . Dizziness and weakness    Immunizations Administered    Name Date Dose VIS Date Route   Pfizer COVID-19 Vaccine 02/15/2019  2:58 PM 0.3 mL 12/21/2018 Intramuscular   Manufacturer: [place]   Lot: [id number]   [place]: [id number]

## 2019-03-07 ENCOUNTER — Ambulatory Visit: Payer: Medicare Other | Admitting: Pharmacist

## 2019-03-07 ENCOUNTER — Other Ambulatory Visit: Payer: Self-pay

## 2019-03-07 DIAGNOSIS — I4891 Unspecified atrial fibrillation: Secondary | ICD-10-CM | POA: Diagnosis not present

## 2019-03-07 DIAGNOSIS — I6789 Other cerebrovascular disease: Secondary | ICD-10-CM | POA: Diagnosis not present

## 2019-03-07 DIAGNOSIS — Z5181 Encounter for therapeutic drug level monitoring: Secondary | ICD-10-CM | POA: Diagnosis not present

## 2019-03-07 LAB — POCT INR: INR: 2.6 (ref 2.0–3.0)

## 2019-03-07 NOTE — Patient Instructions (Signed)
Continue taking 1 tablet daily except 1.5 tablets on Sundays. Recheck in 7 weeks. Call us with any medication changes [phone]

## 2019-03-13 ENCOUNTER — Ambulatory Visit: Payer: Medicare Other | Attending: Internal Medicine

## 2019-03-13 DIAGNOSIS — Z23 Encounter for immunization: Secondary | ICD-10-CM | POA: Insufficient documentation

## 2019-03-13 NOTE — Progress Notes (Signed)
   Covid-19 Vaccination Clinic  Name:  [name]    MRN: [id number] DOB: April 15, 1930  03/13/2019  [name] was observed post Covid-19 immunization for 15 minutes without incident. He was provided with Vaccine Information Sheet and instruction to access the V-Safe system.   [name] was instructed to call 911 with any severe reactions post vaccine: Marland Kitchen Difficulty breathing  . Swelling of face and throat  . A fast heartbeat  . A bad rash all over body  . Dizziness and weakness   Immunizations Administered    Name Date Dose VIS Date Route   Pfizer COVID-19 Vaccine 03/13/2019 11:00 AM 0.3 mL 12/21/2018 Intramuscular   Manufacturer: [place]   Lot: [id number]   [place]: [id number]

## 2019-04-25 ENCOUNTER — Ambulatory Visit: Payer: Medicare Other | Admitting: *Deleted

## 2019-04-25 ENCOUNTER — Other Ambulatory Visit: Payer: Self-pay

## 2019-04-25 DIAGNOSIS — I4891 Unspecified atrial fibrillation: Secondary | ICD-10-CM | POA: Diagnosis not present

## 2019-04-25 DIAGNOSIS — Z5181 Encounter for therapeutic drug level monitoring: Secondary | ICD-10-CM

## 2019-04-25 DIAGNOSIS — I6789 Other cerebrovascular disease: Secondary | ICD-10-CM

## 2019-04-25 LAB — POCT INR: INR: 2.7 (ref 2.0–3.0)

## 2019-04-25 NOTE — Patient Instructions (Signed)
Description   Continue taking 1 tablet daily except 1.5 tablets on Sundays. Recheck in 8 weeks. Call us with any medication changes #[phone]     

## 2019-04-29 ENCOUNTER — Other Ambulatory Visit: Payer: Self-pay

## 2019-04-29 ENCOUNTER — Encounter: Payer: Self-pay | Admitting: Physician Assistant

## 2019-04-29 ENCOUNTER — Ambulatory Visit: Payer: Medicare Other | Admitting: Physician Assistant

## 2019-04-29 ENCOUNTER — Ambulatory Visit: Payer: Self-pay

## 2019-04-29 DIAGNOSIS — M25512 Pain in left shoulder: Secondary | ICD-10-CM | POA: Diagnosis not present

## 2019-04-29 DIAGNOSIS — G8929 Other chronic pain: Secondary | ICD-10-CM

## 2019-04-29 NOTE — Progress Notes (Signed)
Subjective: Patient is here for ultrasound-guided intra-articular left glenohumeral injection.  He got quite a bit of relief after his last injection.  Objective: Very limited active range of motion with palpable crepitus.  Procedure: Ultrasound-guided left glenohumeral injection: After sterile prep with Betadine, injected 8 cc 1% lidocaine without epinephrine and 40 mg methylprednisolone using a 22-gauge spinal needle, passing the needle through approach into the glenohumeral joint.  Injectate seen filling the joint capsule.  He had good immediate relief.

## 2019-04-29 NOTE — Progress Notes (Signed)
Office Visit Note   Patient: [name]           Date of Birth: 09-08-1930           MRN: [id number] Visit Date: 04/29/2019              Requested by: Gaynelle Arabian, MD [address] [place],  [place] [address] PCP: Gaynelle Arabian, MD   Assessment & Plan: Visit Diagnoses:  1. Chronic left shoulder pain     Plan: Given the fact that he got good relief with the last intra-articular injection in May 2020 lasted least 8 to 9 months would recommend repeat intra-articular injection.  We will send him for this with Dr. Junius Roads under ultrasound.  He will follow up with [place] as needed.  Questions were encouraged and answered at length today.  Follow-Up Instructions: No follow-ups on file.   Orders:  No orders of the defined types were placed in this encounter.  No orders of the defined types were placed in this encounter.     Procedures: No procedures performed   Clinical Data: No additional findings.   Subjective: Chief Complaint  Patient presents with  . Right Shoulder - Pain    HPI Mr. Petite is well-known to Dr. Ninfa Linden service comes in today due to increased left shoulder pain.  He has known end-stage arthritis of the left shoulder.  He states over the last 3 months he has had increased pain in the shoulder.  No new injury.  Did undergo a intra-articular injection in May 2020 left shoulder got good relief.  Patient is nondiabetic.  Reports that he got his last Covid vaccine in February.  Review of Systems See HPI otherwise negative  Objective: Vital Signs: There were no vitals taken for this visit.  Physical Exam Constitutional:      Appearance: He is not ill-appearing or diaphoretic.  Pulmonary:     Effort: Pulmonary effort is normal.  Neurological:     Mental Status: He is alert and oriented to person, place, and time.  Psychiatric:        Mood and Affect: Mood normal.     Ortho Exam Left shoulder passive forward flexion to approximately  140.  Limited external and internal rotation with significant crepitus.  Specialty Comments:  No specialty comments available.  Imaging: No results found.   PMFS History: Patient Active Problem List   Diagnosis Date Noted  . Subdural hematoma ([place]) 10/22/2015  . Thrombocytopenia ([place]) 10/22/2015  . Chronic venous stasis dermatitis 10/22/2015  . Fall   . Head injury   . Essential hypertension 01/21/2014  . Embolic stroke ([place]) 123XX123  . Long term current use of anticoagulant therapy 12/25/2012  . Atrial fibrillation ([place]) 10/15/2012  . Acute, but ill-defined, cerebrovascular disease 10/15/2012   Past Medical History:  Diagnosis Date  . Atrial fibrillation ([place]) 10/15/2012  . Cancer (HCC)    Skin (R hand)  . Embolic stroke ([place]) 123XX123  . Essential hypertension 01/21/2014  . Long term current use of anticoagulant therapy 12/25/2012    Family History  Problem Relation Age of Onset  . Other Mother        Brain hemmorrhage  . Other Father        unknown    Past Surgical History:  Procedure Laterality Date  . APPENDECTOMY    . CARDIAC CATHETERIZATION    . CYSTOSCOPY WITH INSERTION OF UROLIFT N/A 10/10/2017   Procedure: CYSTOSCOPY WITH INSERTION OF UROLIFT;  Surgeon: [name], MD;  Location: [facility];  Service: Urology;  Laterality: N/A;  NEEDS 60 MIN  . EYE SURGERY Bilateral 2013   Cataracts   Social History   Occupational History  . Not on file  Tobacco Use  . Smoking status: Former Smoker    Packs/day: 0.25    Years: 10.00    Pack years: 2.50    Types: Cigarettes  . Smokeless tobacco: Never Used  Substance and Sexual Activity  . Alcohol use: No    Alcohol/week: 0.0 standard drinks  . Drug use: No  . Sexual activity: Not on file

## 2019-05-07 ENCOUNTER — Other Ambulatory Visit: Payer: Self-pay | Admitting: Interventional Cardiology

## 2019-06-20 ENCOUNTER — Other Ambulatory Visit: Payer: Self-pay

## 2019-06-20 ENCOUNTER — Ambulatory Visit: Payer: Medicare Other | Admitting: Pharmacist

## 2019-06-20 DIAGNOSIS — Z5181 Encounter for therapeutic drug level monitoring: Secondary | ICD-10-CM | POA: Diagnosis not present

## 2019-06-20 DIAGNOSIS — I6789 Other cerebrovascular disease: Secondary | ICD-10-CM

## 2019-06-20 DIAGNOSIS — I4891 Unspecified atrial fibrillation: Secondary | ICD-10-CM

## 2019-06-20 LAB — POCT INR: INR: 2.7 (ref 2.0–3.0)

## 2019-06-20 NOTE — Patient Instructions (Signed)
Description   Continue taking 1 tablet daily except 1.5 tablets on Sundays. Recheck in 8 weeks. Call us with any medication changes [phone]

## 2019-07-30 NOTE — Telephone Encounter (Signed)
Close this encounter

## 2019-08-15 ENCOUNTER — Other Ambulatory Visit: Payer: Self-pay

## 2019-08-15 ENCOUNTER — Ambulatory Visit: Payer: Medicare Other | Admitting: Pharmacist

## 2019-08-15 DIAGNOSIS — Z5181 Encounter for therapeutic drug level monitoring: Secondary | ICD-10-CM

## 2019-08-15 DIAGNOSIS — I6789 Other cerebrovascular disease: Secondary | ICD-10-CM | POA: Diagnosis not present

## 2019-08-15 DIAGNOSIS — I4891 Unspecified atrial fibrillation: Secondary | ICD-10-CM

## 2019-08-15 LAB — POCT INR: INR: 2.9 (ref 2.0–3.0)

## 2019-08-15 NOTE — Patient Instructions (Signed)
Description   Continue taking 1 tablet daily except 1.5 tablets on Sundays. Recheck in 8 weeks. Call us with any medication changes [phone]

## 2019-08-20 ENCOUNTER — Other Ambulatory Visit: Payer: Self-pay | Admitting: Interventional Cardiology

## 2019-09-23 ENCOUNTER — Ambulatory Visit: Payer: Medicare Other | Attending: Internal Medicine

## 2019-09-23 DIAGNOSIS — Z23 Encounter for immunization: Secondary | ICD-10-CM

## 2019-09-23 NOTE — Progress Notes (Signed)
   Covid-19 Vaccination Clinic  Name:  [name]    MRN: [id number] DOB: Apr 22, 1930  09/23/2019  Mr. Wise was observed post Covid-19 immunization for 15 minutes without incident. He was provided with Vaccine Information Sheet and instruction to access the V-Safe system.   [name] was instructed to call 911 with any severe reactions post vaccine: Marland Kitchen Difficulty breathing  . Swelling of face and throat  . A fast heartbeat  . A bad rash all over body  . Dizziness and weakness

## 2019-09-30 ENCOUNTER — Ambulatory Visit: Payer: Self-pay

## 2019-09-30 ENCOUNTER — Encounter: Payer: Self-pay | Admitting: Orthopaedic Surgery

## 2019-09-30 ENCOUNTER — Ambulatory Visit: Payer: Medicare Other | Admitting: Family Medicine

## 2019-09-30 DIAGNOSIS — M25512 Pain in left shoulder: Secondary | ICD-10-CM

## 2019-09-30 DIAGNOSIS — G8929 Other chronic pain: Secondary | ICD-10-CM | POA: Diagnosis not present

## 2019-09-30 MED ORDER — TRAMADOL HCL 50 MG PO TABS: 50.0000 mg | ORAL_TABLET | Freq: Four times a day (QID) | ORAL | 0 refills | Status: DC | PRN

## 2019-09-30 NOTE — Progress Notes (Signed)
Office Visit Note   Patient: [name]           Date of Birth: Feb 17, 1930           MRN: [id number] Visit Date: 09/30/2019 Requested by: Gaynelle Arabian, MD [address] [place],  [place] [address] PCP: Gaynelle Arabian, MD  Subjective: Chief Complaint  Patient presents with  . Left Shoulder - Pain, Follow-up    HPI: He is here with recurrent left shoulder pain.  Glenohumeral injection in April helped a lot until 2 weeks ago.  Now his pain is worse than it was before.  He would like another injection if possible.              ROS:   All other systems were reviewed and are negative.  Objective: Vital Signs: There were no vitals taken for this visit.  Physical Exam:  General:  Alert and oriented, in no acute distress. Pulm:  Breathing unlabored. Psy:  Normal mood, congruent affect. Skin: No erythema Left shoulder: Limited active range of motion with overhead reaching in the back reach.  Tender in the posterior shoulder.  Imaging: US Guided Needle Placement  Result Date: 09/30/2019 Ultrasound-guided left glenohumeral injection: After sterile prep with Betadine, injected 8 cc 1% lidocaine without epinephrine and 40 mg methylprednisolone using a 22-gauge spinal needle, passing the needle from posterior approach into the glenohumeral joint.  Injectate seen filling joint capsule.     Assessment & Plan: 1.  Recurrent left shoulder pain due to glenohumeral DJD -Injection given as above.  Follow-up as needed.  Tramadol for pain.     Procedures: No procedures performed  No notes on file     PMFS History: Patient Active Problem List   Diagnosis Date Noted  . Subdural hematoma ([facility]) 10/22/2015  . Thrombocytopenia ([place]) 10/22/2015  . Chronic venous stasis dermatitis 10/22/2015  . Fall   . Head injury   . Essential hypertension 01/21/2014  . Embolic stroke ([place] [place]) 42/87/6811  . Long term current use of anticoagulant therapy 12/25/2012  . Atrial  fibrillation ([place]) 10/15/2012  . Acute, but ill-defined, cerebrovascular disease 10/15/2012   Past Medical History:  Diagnosis Date  . Atrial fibrillation ([place]) 10/15/2012  . Cancer (HCC)    Skin (R hand)  . Embolic stroke ([place]) 57/26/2035  . Essential hypertension 01/21/2014  . Long term current use of anticoagulant therapy 12/25/2012    Family History  Problem Relation Age of Onset  . Other Mother        Brain hemmorrhage  . Other Father        unknown    Past Surgical History:  Procedure Laterality Date  . APPENDECTOMY    . CARDIAC CATHETERIZATION    . CYSTOSCOPY WITH INSERTION OF UROLIFT N/A 10/10/2017   Procedure: CYSTOSCOPY WITH INSERTION OF UROLIFT;  Surgeon: [name], MD;  Location: [facility];  Service: Urology;  Laterality: N/A;  NEEDS 60 MIN  . EYE SURGERY Bilateral 2013   Cataracts   Social History   Occupational History  . Not on file  Tobacco Use  . Smoking status: Former Smoker    Packs/day: 0.25    Years: 10.00    Pack years: 2.50    Types: Cigarettes  . Smokeless tobacco: Never Used  Vaping Use  . Vaping Use: Never used  Substance and Sexual Activity  . Alcohol use: No    Alcohol/week: 0.0 standard drinks  . Drug use: No  . Sexual activity: Not  on file

## 2019-09-30 NOTE — Progress Notes (Signed)
error 

## 2019-10-10 ENCOUNTER — Other Ambulatory Visit: Payer: Self-pay

## 2019-10-10 ENCOUNTER — Ambulatory Visit (INDEPENDENT_AMBULATORY_CARE_PROVIDER_SITE_OTHER): Payer: Medicare Other

## 2019-10-10 DIAGNOSIS — Z5181 Encounter for therapeutic drug level monitoring: Secondary | ICD-10-CM | POA: Diagnosis not present

## 2019-10-10 DIAGNOSIS — I4891 Unspecified atrial fibrillation: Secondary | ICD-10-CM

## 2019-10-10 DIAGNOSIS — I6789 Other cerebrovascular disease: Secondary | ICD-10-CM | POA: Diagnosis not present

## 2019-10-10 LAB — POCT INR: INR: 3.1 — AB (ref 2.0–3.0)

## 2019-10-10 NOTE — Patient Instructions (Signed)
Description   Take 1/2 tablet today, then resume same dosage 1 tablet daily except 1.5 tablets on Sundays. Recheck in 8 weeks. Call us with any medication changes [phone]

## 2019-11-26 ENCOUNTER — Telehealth: Payer: Self-pay

## 2019-11-26 NOTE — Telephone Encounter (Signed)
Son aware that there is nothing in the chart stating anyone has called him

## 2019-11-26 NOTE — Telephone Encounter (Signed)
Patient son called in saying patient got a missed call but no vm .. advised no recent notes but I willl have him contacted .

## 2019-11-28 ENCOUNTER — Ambulatory Visit: Payer: Medicare Other | Admitting: *Deleted

## 2019-11-28 ENCOUNTER — Other Ambulatory Visit: Payer: Self-pay

## 2019-11-28 DIAGNOSIS — Z5181 Encounter for therapeutic drug level monitoring: Secondary | ICD-10-CM

## 2019-11-28 DIAGNOSIS — I4891 Unspecified atrial fibrillation: Secondary | ICD-10-CM

## 2019-11-28 DIAGNOSIS — I6789 Other cerebrovascular disease: Secondary | ICD-10-CM | POA: Diagnosis not present

## 2019-11-28 LAB — POCT INR: INR: 3.4 — AB (ref 2.0–3.0)

## 2019-11-28 NOTE — Patient Instructions (Addendum)
Description   Do not take any Warfarin tomorrow then start taking Warfarin 1 tablet daily. Recheck in 4 weeks (normally 8 weeks). Call us with any medication changes [phone]

## 2019-12-25 ENCOUNTER — Other Ambulatory Visit: Payer: Self-pay

## 2019-12-25 ENCOUNTER — Ambulatory Visit: Payer: Medicare Other | Admitting: *Deleted

## 2019-12-25 DIAGNOSIS — Z5181 Encounter for therapeutic drug level monitoring: Secondary | ICD-10-CM | POA: Diagnosis not present

## 2019-12-25 DIAGNOSIS — I6789 Other cerebrovascular disease: Secondary | ICD-10-CM | POA: Diagnosis not present

## 2019-12-25 DIAGNOSIS — I4891 Unspecified atrial fibrillation: Secondary | ICD-10-CM | POA: Diagnosis not present

## 2019-12-25 LAB — POCT INR: INR: 2.4 (ref 2.0–3.0)

## 2019-12-25 NOTE — Patient Instructions (Signed)
Description   Continue taking Warfarin 1 tablet daily. Recheck in 5 weeks. Call us with any medication changes [phone]

## 2020-01-20 ENCOUNTER — Other Ambulatory Visit: Payer: Self-pay | Admitting: Interventional Cardiology

## 2020-01-20 DIAGNOSIS — R7301 Impaired fasting glucose: Secondary | ICD-10-CM | POA: Diagnosis not present

## 2020-01-25 ENCOUNTER — Other Ambulatory Visit: Payer: Self-pay | Admitting: Interventional Cardiology

## 2020-01-29 ENCOUNTER — Other Ambulatory Visit: Payer: Self-pay

## 2020-01-29 ENCOUNTER — Ambulatory Visit (INDEPENDENT_AMBULATORY_CARE_PROVIDER_SITE_OTHER): Payer: Medicare Other | Admitting: *Deleted

## 2020-01-29 DIAGNOSIS — I4891 Unspecified atrial fibrillation: Secondary | ICD-10-CM

## 2020-01-29 DIAGNOSIS — I6789 Other cerebrovascular disease: Secondary | ICD-10-CM | POA: Diagnosis not present

## 2020-01-29 DIAGNOSIS — Z5181 Encounter for therapeutic drug level monitoring: Secondary | ICD-10-CM

## 2020-01-29 LAB — POCT INR: INR: 2 (ref 2.0–3.0)

## 2020-01-29 NOTE — Patient Instructions (Addendum)
Description   Continue taking Warfarin 1 tablet daily. Recheck in 5 weeks. Call us with any medication changes #[phone]     

## 2020-01-30 ENCOUNTER — Other Ambulatory Visit: Payer: Self-pay | Admitting: Interventional Cardiology

## 2020-03-02 DIAGNOSIS — Z9181 History of falling: Secondary | ICD-10-CM | POA: Diagnosis not present

## 2020-03-02 DIAGNOSIS — K5790 Diverticulosis of intestine, part unspecified, without perforation or abscess without bleeding: Secondary | ICD-10-CM | POA: Diagnosis not present

## 2020-03-02 DIAGNOSIS — D6869 Other thrombophilia: Secondary | ICD-10-CM | POA: Diagnosis not present

## 2020-03-02 DIAGNOSIS — E781 Pure hyperglyceridemia: Secondary | ICD-10-CM | POA: Diagnosis not present

## 2020-03-02 DIAGNOSIS — Z89012 Acquired absence of left thumb: Secondary | ICD-10-CM | POA: Diagnosis not present

## 2020-03-02 DIAGNOSIS — Z7982 Long term (current) use of aspirin: Secondary | ICD-10-CM | POA: Diagnosis not present

## 2020-03-02 DIAGNOSIS — Z7901 Long term (current) use of anticoagulants: Secondary | ICD-10-CM | POA: Diagnosis not present

## 2020-03-02 DIAGNOSIS — Z96641 Presence of right artificial hip joint: Secondary | ICD-10-CM | POA: Diagnosis not present

## 2020-03-02 DIAGNOSIS — I10 Essential (primary) hypertension: Secondary | ICD-10-CM | POA: Diagnosis not present

## 2020-03-02 DIAGNOSIS — Z8673 Personal history of transient ischemic attack (TIA), and cerebral infarction without residual deficits: Secondary | ICD-10-CM | POA: Diagnosis not present

## 2020-03-02 DIAGNOSIS — Z87891 Personal history of nicotine dependence: Secondary | ICD-10-CM | POA: Diagnosis not present

## 2020-03-02 DIAGNOSIS — F32A Depression, unspecified: Secondary | ICD-10-CM | POA: Diagnosis not present

## 2020-03-02 DIAGNOSIS — I480 Paroxysmal atrial fibrillation: Secondary | ICD-10-CM | POA: Diagnosis not present

## 2020-03-02 DIAGNOSIS — L570 Actinic keratosis: Secondary | ICD-10-CM | POA: Diagnosis not present

## 2020-03-06 DIAGNOSIS — D6869 Other thrombophilia: Secondary | ICD-10-CM | POA: Diagnosis not present

## 2020-03-06 DIAGNOSIS — K5790 Diverticulosis of intestine, part unspecified, without perforation or abscess without bleeding: Secondary | ICD-10-CM | POA: Diagnosis not present

## 2020-03-06 DIAGNOSIS — Z7901 Long term (current) use of anticoagulants: Secondary | ICD-10-CM | POA: Diagnosis not present

## 2020-03-06 DIAGNOSIS — L570 Actinic keratosis: Secondary | ICD-10-CM | POA: Diagnosis not present

## 2020-03-06 DIAGNOSIS — Z7982 Long term (current) use of aspirin: Secondary | ICD-10-CM | POA: Diagnosis not present

## 2020-03-06 DIAGNOSIS — Z8673 Personal history of transient ischemic attack (TIA), and cerebral infarction without residual deficits: Secondary | ICD-10-CM | POA: Diagnosis not present

## 2020-03-06 DIAGNOSIS — Z9181 History of falling: Secondary | ICD-10-CM | POA: Diagnosis not present

## 2020-03-06 DIAGNOSIS — Z87891 Personal history of nicotine dependence: Secondary | ICD-10-CM | POA: Diagnosis not present

## 2020-03-06 DIAGNOSIS — Z89012 Acquired absence of left thumb: Secondary | ICD-10-CM | POA: Diagnosis not present

## 2020-03-06 DIAGNOSIS — F32A Depression, unspecified: Secondary | ICD-10-CM | POA: Diagnosis not present

## 2020-03-06 DIAGNOSIS — I480 Paroxysmal atrial fibrillation: Secondary | ICD-10-CM | POA: Diagnosis not present

## 2020-03-06 DIAGNOSIS — E781 Pure hyperglyceridemia: Secondary | ICD-10-CM | POA: Diagnosis not present

## 2020-03-06 DIAGNOSIS — I10 Essential (primary) hypertension: Secondary | ICD-10-CM | POA: Diagnosis not present

## 2020-03-06 DIAGNOSIS — Z96641 Presence of right artificial hip joint: Secondary | ICD-10-CM | POA: Diagnosis not present

## 2020-03-10 DIAGNOSIS — I10 Essential (primary) hypertension: Secondary | ICD-10-CM | POA: Diagnosis not present

## 2020-03-10 DIAGNOSIS — Z96641 Presence of right artificial hip joint: Secondary | ICD-10-CM | POA: Diagnosis not present

## 2020-03-10 DIAGNOSIS — Z9181 History of falling: Secondary | ICD-10-CM | POA: Diagnosis not present

## 2020-03-10 DIAGNOSIS — D6869 Other thrombophilia: Secondary | ICD-10-CM | POA: Diagnosis not present

## 2020-03-10 DIAGNOSIS — E781 Pure hyperglyceridemia: Secondary | ICD-10-CM | POA: Diagnosis not present

## 2020-03-10 DIAGNOSIS — K5790 Diverticulosis of intestine, part unspecified, without perforation or abscess without bleeding: Secondary | ICD-10-CM | POA: Diagnosis not present

## 2020-03-10 DIAGNOSIS — Z87891 Personal history of nicotine dependence: Secondary | ICD-10-CM | POA: Diagnosis not present

## 2020-03-10 DIAGNOSIS — L570 Actinic keratosis: Secondary | ICD-10-CM | POA: Diagnosis not present

## 2020-03-10 DIAGNOSIS — Z7901 Long term (current) use of anticoagulants: Secondary | ICD-10-CM | POA: Diagnosis not present

## 2020-03-10 DIAGNOSIS — F32A Depression, unspecified: Secondary | ICD-10-CM | POA: Diagnosis not present

## 2020-03-10 DIAGNOSIS — Z8673 Personal history of transient ischemic attack (TIA), and cerebral infarction without residual deficits: Secondary | ICD-10-CM | POA: Diagnosis not present

## 2020-03-10 DIAGNOSIS — Z7982 Long term (current) use of aspirin: Secondary | ICD-10-CM | POA: Diagnosis not present

## 2020-03-10 DIAGNOSIS — Z89012 Acquired absence of left thumb: Secondary | ICD-10-CM | POA: Diagnosis not present

## 2020-03-10 DIAGNOSIS — I480 Paroxysmal atrial fibrillation: Secondary | ICD-10-CM | POA: Diagnosis not present

## 2020-03-12 DIAGNOSIS — D6869 Other thrombophilia: Secondary | ICD-10-CM | POA: Diagnosis not present

## 2020-03-12 DIAGNOSIS — Z7982 Long term (current) use of aspirin: Secondary | ICD-10-CM | POA: Diagnosis not present

## 2020-03-12 DIAGNOSIS — I480 Paroxysmal atrial fibrillation: Secondary | ICD-10-CM | POA: Diagnosis not present

## 2020-03-12 DIAGNOSIS — K5790 Diverticulosis of intestine, part unspecified, without perforation or abscess without bleeding: Secondary | ICD-10-CM | POA: Diagnosis not present

## 2020-03-12 DIAGNOSIS — Z87891 Personal history of nicotine dependence: Secondary | ICD-10-CM | POA: Diagnosis not present

## 2020-03-12 DIAGNOSIS — L570 Actinic keratosis: Secondary | ICD-10-CM | POA: Diagnosis not present

## 2020-03-12 DIAGNOSIS — Z89012 Acquired absence of left thumb: Secondary | ICD-10-CM | POA: Diagnosis not present

## 2020-03-12 DIAGNOSIS — I10 Essential (primary) hypertension: Secondary | ICD-10-CM | POA: Diagnosis not present

## 2020-03-12 DIAGNOSIS — Z9181 History of falling: Secondary | ICD-10-CM | POA: Diagnosis not present

## 2020-03-12 DIAGNOSIS — Z8673 Personal history of transient ischemic attack (TIA), and cerebral infarction without residual deficits: Secondary | ICD-10-CM | POA: Diagnosis not present

## 2020-03-12 DIAGNOSIS — E781 Pure hyperglyceridemia: Secondary | ICD-10-CM | POA: Diagnosis not present

## 2020-03-12 DIAGNOSIS — Z96641 Presence of right artificial hip joint: Secondary | ICD-10-CM | POA: Diagnosis not present

## 2020-03-12 DIAGNOSIS — F32A Depression, unspecified: Secondary | ICD-10-CM | POA: Diagnosis not present

## 2020-03-12 DIAGNOSIS — Z7901 Long term (current) use of anticoagulants: Secondary | ICD-10-CM | POA: Diagnosis not present

## 2020-03-14 ENCOUNTER — Other Ambulatory Visit: Payer: Self-pay | Admitting: Interventional Cardiology

## 2020-03-17 DIAGNOSIS — I480 Paroxysmal atrial fibrillation: Secondary | ICD-10-CM | POA: Diagnosis not present

## 2020-03-17 DIAGNOSIS — F32A Depression, unspecified: Secondary | ICD-10-CM | POA: Diagnosis not present

## 2020-03-17 DIAGNOSIS — E781 Pure hyperglyceridemia: Secondary | ICD-10-CM | POA: Diagnosis not present

## 2020-03-17 DIAGNOSIS — Z87891 Personal history of nicotine dependence: Secondary | ICD-10-CM | POA: Diagnosis not present

## 2020-03-17 DIAGNOSIS — Z89012 Acquired absence of left thumb: Secondary | ICD-10-CM | POA: Diagnosis not present

## 2020-03-17 DIAGNOSIS — D6869 Other thrombophilia: Secondary | ICD-10-CM | POA: Diagnosis not present

## 2020-03-17 DIAGNOSIS — Z7901 Long term (current) use of anticoagulants: Secondary | ICD-10-CM | POA: Diagnosis not present

## 2020-03-17 DIAGNOSIS — Z7982 Long term (current) use of aspirin: Secondary | ICD-10-CM | POA: Diagnosis not present

## 2020-03-17 DIAGNOSIS — K5790 Diverticulosis of intestine, part unspecified, without perforation or abscess without bleeding: Secondary | ICD-10-CM | POA: Diagnosis not present

## 2020-03-17 DIAGNOSIS — Z9181 History of falling: Secondary | ICD-10-CM | POA: Diagnosis not present

## 2020-03-17 DIAGNOSIS — Z96641 Presence of right artificial hip joint: Secondary | ICD-10-CM | POA: Diagnosis not present

## 2020-03-17 DIAGNOSIS — Z8673 Personal history of transient ischemic attack (TIA), and cerebral infarction without residual deficits: Secondary | ICD-10-CM | POA: Diagnosis not present

## 2020-03-17 DIAGNOSIS — I10 Essential (primary) hypertension: Secondary | ICD-10-CM | POA: Diagnosis not present

## 2020-03-17 DIAGNOSIS — L570 Actinic keratosis: Secondary | ICD-10-CM | POA: Diagnosis not present

## 2020-03-18 ENCOUNTER — Other Ambulatory Visit: Payer: Self-pay

## 2020-03-18 ENCOUNTER — Ambulatory Visit (INDEPENDENT_AMBULATORY_CARE_PROVIDER_SITE_OTHER): Payer: Medicare Other | Admitting: *Deleted

## 2020-03-18 DIAGNOSIS — I6789 Other cerebrovascular disease: Secondary | ICD-10-CM | POA: Diagnosis not present

## 2020-03-18 DIAGNOSIS — Z5181 Encounter for therapeutic drug level monitoring: Secondary | ICD-10-CM | POA: Diagnosis not present

## 2020-03-18 DIAGNOSIS — I4891 Unspecified atrial fibrillation: Secondary | ICD-10-CM

## 2020-03-18 LAB — POCT INR: INR: 2.2 (ref 2.0–3.0)

## 2020-03-18 NOTE — Patient Instructions (Signed)
Description   Continue taking Warfarin 1 tablet daily. Recheck in 6 weeks. Call us with any medication changes [phone]

## 2020-03-19 DIAGNOSIS — Z87891 Personal history of nicotine dependence: Secondary | ICD-10-CM | POA: Diagnosis not present

## 2020-03-19 DIAGNOSIS — Z9181 History of falling: Secondary | ICD-10-CM | POA: Diagnosis not present

## 2020-03-19 DIAGNOSIS — I480 Paroxysmal atrial fibrillation: Secondary | ICD-10-CM | POA: Diagnosis not present

## 2020-03-19 DIAGNOSIS — Z7982 Long term (current) use of aspirin: Secondary | ICD-10-CM | POA: Diagnosis not present

## 2020-03-19 DIAGNOSIS — Z8673 Personal history of transient ischemic attack (TIA), and cerebral infarction without residual deficits: Secondary | ICD-10-CM | POA: Diagnosis not present

## 2020-03-19 DIAGNOSIS — I10 Essential (primary) hypertension: Secondary | ICD-10-CM | POA: Diagnosis not present

## 2020-03-19 DIAGNOSIS — K5790 Diverticulosis of intestine, part unspecified, without perforation or abscess without bleeding: Secondary | ICD-10-CM | POA: Diagnosis not present

## 2020-03-19 DIAGNOSIS — L570 Actinic keratosis: Secondary | ICD-10-CM | POA: Diagnosis not present

## 2020-03-19 DIAGNOSIS — Z96641 Presence of right artificial hip joint: Secondary | ICD-10-CM | POA: Diagnosis not present

## 2020-03-19 DIAGNOSIS — D6869 Other thrombophilia: Secondary | ICD-10-CM | POA: Diagnosis not present

## 2020-03-19 DIAGNOSIS — Z7901 Long term (current) use of anticoagulants: Secondary | ICD-10-CM | POA: Diagnosis not present

## 2020-03-19 DIAGNOSIS — F32A Depression, unspecified: Secondary | ICD-10-CM | POA: Diagnosis not present

## 2020-03-19 DIAGNOSIS — E781 Pure hyperglyceridemia: Secondary | ICD-10-CM | POA: Diagnosis not present

## 2020-03-19 DIAGNOSIS — Z89012 Acquired absence of left thumb: Secondary | ICD-10-CM | POA: Diagnosis not present

## 2020-03-23 DIAGNOSIS — Z7901 Long term (current) use of anticoagulants: Secondary | ICD-10-CM | POA: Diagnosis not present

## 2020-03-23 DIAGNOSIS — D6869 Other thrombophilia: Secondary | ICD-10-CM | POA: Diagnosis not present

## 2020-03-23 DIAGNOSIS — L570 Actinic keratosis: Secondary | ICD-10-CM | POA: Diagnosis not present

## 2020-03-23 DIAGNOSIS — Z7982 Long term (current) use of aspirin: Secondary | ICD-10-CM | POA: Diagnosis not present

## 2020-03-23 DIAGNOSIS — I10 Essential (primary) hypertension: Secondary | ICD-10-CM | POA: Diagnosis not present

## 2020-03-23 DIAGNOSIS — K5790 Diverticulosis of intestine, part unspecified, without perforation or abscess without bleeding: Secondary | ICD-10-CM | POA: Diagnosis not present

## 2020-03-23 DIAGNOSIS — F32A Depression, unspecified: Secondary | ICD-10-CM | POA: Diagnosis not present

## 2020-03-23 DIAGNOSIS — Z96641 Presence of right artificial hip joint: Secondary | ICD-10-CM | POA: Diagnosis not present

## 2020-03-23 DIAGNOSIS — Z9181 History of falling: Secondary | ICD-10-CM | POA: Diagnosis not present

## 2020-03-23 DIAGNOSIS — Z87891 Personal history of nicotine dependence: Secondary | ICD-10-CM | POA: Diagnosis not present

## 2020-03-23 DIAGNOSIS — Z8673 Personal history of transient ischemic attack (TIA), and cerebral infarction without residual deficits: Secondary | ICD-10-CM | POA: Diagnosis not present

## 2020-03-23 DIAGNOSIS — I480 Paroxysmal atrial fibrillation: Secondary | ICD-10-CM | POA: Diagnosis not present

## 2020-03-23 DIAGNOSIS — Z89012 Acquired absence of left thumb: Secondary | ICD-10-CM | POA: Diagnosis not present

## 2020-03-23 DIAGNOSIS — E781 Pure hyperglyceridemia: Secondary | ICD-10-CM | POA: Diagnosis not present

## 2020-03-26 DIAGNOSIS — E781 Pure hyperglyceridemia: Secondary | ICD-10-CM | POA: Diagnosis not present

## 2020-03-26 DIAGNOSIS — Z8673 Personal history of transient ischemic attack (TIA), and cerebral infarction without residual deficits: Secondary | ICD-10-CM | POA: Diagnosis not present

## 2020-03-26 DIAGNOSIS — Z89012 Acquired absence of left thumb: Secondary | ICD-10-CM | POA: Diagnosis not present

## 2020-03-26 DIAGNOSIS — Z7982 Long term (current) use of aspirin: Secondary | ICD-10-CM | POA: Diagnosis not present

## 2020-03-26 DIAGNOSIS — L570 Actinic keratosis: Secondary | ICD-10-CM | POA: Diagnosis not present

## 2020-03-26 DIAGNOSIS — Z9181 History of falling: Secondary | ICD-10-CM | POA: Diagnosis not present

## 2020-03-26 DIAGNOSIS — I10 Essential (primary) hypertension: Secondary | ICD-10-CM | POA: Diagnosis not present

## 2020-03-26 DIAGNOSIS — I480 Paroxysmal atrial fibrillation: Secondary | ICD-10-CM | POA: Diagnosis not present

## 2020-03-26 DIAGNOSIS — Z96641 Presence of right artificial hip joint: Secondary | ICD-10-CM | POA: Diagnosis not present

## 2020-03-26 DIAGNOSIS — Z7901 Long term (current) use of anticoagulants: Secondary | ICD-10-CM | POA: Diagnosis not present

## 2020-03-26 DIAGNOSIS — K5790 Diverticulosis of intestine, part unspecified, without perforation or abscess without bleeding: Secondary | ICD-10-CM | POA: Diagnosis not present

## 2020-03-26 DIAGNOSIS — Z87891 Personal history of nicotine dependence: Secondary | ICD-10-CM | POA: Diagnosis not present

## 2020-03-26 DIAGNOSIS — F32A Depression, unspecified: Secondary | ICD-10-CM | POA: Diagnosis not present

## 2020-03-26 DIAGNOSIS — D6869 Other thrombophilia: Secondary | ICD-10-CM | POA: Diagnosis not present

## 2020-03-30 DIAGNOSIS — E781 Pure hyperglyceridemia: Secondary | ICD-10-CM | POA: Diagnosis not present

## 2020-03-30 DIAGNOSIS — L570 Actinic keratosis: Secondary | ICD-10-CM | POA: Diagnosis not present

## 2020-03-30 DIAGNOSIS — Z8673 Personal history of transient ischemic attack (TIA), and cerebral infarction without residual deficits: Secondary | ICD-10-CM | POA: Diagnosis not present

## 2020-03-30 DIAGNOSIS — F32A Depression, unspecified: Secondary | ICD-10-CM | POA: Diagnosis not present

## 2020-03-30 DIAGNOSIS — Z89012 Acquired absence of left thumb: Secondary | ICD-10-CM | POA: Diagnosis not present

## 2020-03-30 DIAGNOSIS — Z7982 Long term (current) use of aspirin: Secondary | ICD-10-CM | POA: Diagnosis not present

## 2020-03-30 DIAGNOSIS — Z7901 Long term (current) use of anticoagulants: Secondary | ICD-10-CM | POA: Diagnosis not present

## 2020-03-30 DIAGNOSIS — Z9181 History of falling: Secondary | ICD-10-CM | POA: Diagnosis not present

## 2020-03-30 DIAGNOSIS — I10 Essential (primary) hypertension: Secondary | ICD-10-CM | POA: Diagnosis not present

## 2020-03-30 DIAGNOSIS — K5790 Diverticulosis of intestine, part unspecified, without perforation or abscess without bleeding: Secondary | ICD-10-CM | POA: Diagnosis not present

## 2020-03-30 DIAGNOSIS — Z87891 Personal history of nicotine dependence: Secondary | ICD-10-CM | POA: Diagnosis not present

## 2020-03-30 DIAGNOSIS — Z96641 Presence of right artificial hip joint: Secondary | ICD-10-CM | POA: Diagnosis not present

## 2020-03-30 DIAGNOSIS — D6869 Other thrombophilia: Secondary | ICD-10-CM | POA: Diagnosis not present

## 2020-03-30 DIAGNOSIS — I480 Paroxysmal atrial fibrillation: Secondary | ICD-10-CM | POA: Diagnosis not present

## 2020-04-01 DIAGNOSIS — L570 Actinic keratosis: Secondary | ICD-10-CM | POA: Diagnosis not present

## 2020-04-01 DIAGNOSIS — Z7901 Long term (current) use of anticoagulants: Secondary | ICD-10-CM | POA: Diagnosis not present

## 2020-04-01 DIAGNOSIS — Z89012 Acquired absence of left thumb: Secondary | ICD-10-CM | POA: Diagnosis not present

## 2020-04-01 DIAGNOSIS — E781 Pure hyperglyceridemia: Secondary | ICD-10-CM | POA: Diagnosis not present

## 2020-04-01 DIAGNOSIS — Z96641 Presence of right artificial hip joint: Secondary | ICD-10-CM | POA: Diagnosis not present

## 2020-04-01 DIAGNOSIS — Z8673 Personal history of transient ischemic attack (TIA), and cerebral infarction without residual deficits: Secondary | ICD-10-CM | POA: Diagnosis not present

## 2020-04-01 DIAGNOSIS — F32A Depression, unspecified: Secondary | ICD-10-CM | POA: Diagnosis not present

## 2020-04-01 DIAGNOSIS — Z9181 History of falling: Secondary | ICD-10-CM | POA: Diagnosis not present

## 2020-04-01 DIAGNOSIS — Z87891 Personal history of nicotine dependence: Secondary | ICD-10-CM | POA: Diagnosis not present

## 2020-04-01 DIAGNOSIS — Z7982 Long term (current) use of aspirin: Secondary | ICD-10-CM | POA: Diagnosis not present

## 2020-04-01 DIAGNOSIS — D6869 Other thrombophilia: Secondary | ICD-10-CM | POA: Diagnosis not present

## 2020-04-01 DIAGNOSIS — I10 Essential (primary) hypertension: Secondary | ICD-10-CM | POA: Diagnosis not present

## 2020-04-01 DIAGNOSIS — I480 Paroxysmal atrial fibrillation: Secondary | ICD-10-CM | POA: Diagnosis not present

## 2020-04-01 DIAGNOSIS — K5790 Diverticulosis of intestine, part unspecified, without perforation or abscess without bleeding: Secondary | ICD-10-CM | POA: Diagnosis not present

## 2020-04-04 ENCOUNTER — Other Ambulatory Visit: Payer: Self-pay | Admitting: Interventional Cardiology

## 2020-04-06 DIAGNOSIS — D6869 Other thrombophilia: Secondary | ICD-10-CM | POA: Diagnosis not present

## 2020-04-06 DIAGNOSIS — I10 Essential (primary) hypertension: Secondary | ICD-10-CM | POA: Diagnosis not present

## 2020-04-06 DIAGNOSIS — Z89012 Acquired absence of left thumb: Secondary | ICD-10-CM | POA: Diagnosis not present

## 2020-04-06 DIAGNOSIS — F32A Depression, unspecified: Secondary | ICD-10-CM | POA: Diagnosis not present

## 2020-04-06 DIAGNOSIS — E781 Pure hyperglyceridemia: Secondary | ICD-10-CM | POA: Diagnosis not present

## 2020-04-06 DIAGNOSIS — Z7901 Long term (current) use of anticoagulants: Secondary | ICD-10-CM | POA: Diagnosis not present

## 2020-04-06 DIAGNOSIS — L570 Actinic keratosis: Secondary | ICD-10-CM | POA: Diagnosis not present

## 2020-04-06 DIAGNOSIS — I480 Paroxysmal atrial fibrillation: Secondary | ICD-10-CM | POA: Diagnosis not present

## 2020-04-06 DIAGNOSIS — K5790 Diverticulosis of intestine, part unspecified, without perforation or abscess without bleeding: Secondary | ICD-10-CM | POA: Diagnosis not present

## 2020-04-06 DIAGNOSIS — Z7982 Long term (current) use of aspirin: Secondary | ICD-10-CM | POA: Diagnosis not present

## 2020-04-06 DIAGNOSIS — Z9181 History of falling: Secondary | ICD-10-CM | POA: Diagnosis not present

## 2020-04-06 DIAGNOSIS — Z96641 Presence of right artificial hip joint: Secondary | ICD-10-CM | POA: Diagnosis not present

## 2020-04-06 DIAGNOSIS — Z87891 Personal history of nicotine dependence: Secondary | ICD-10-CM | POA: Diagnosis not present

## 2020-04-06 DIAGNOSIS — Z8673 Personal history of transient ischemic attack (TIA), and cerebral infarction without residual deficits: Secondary | ICD-10-CM | POA: Diagnosis not present

## 2020-04-10 DIAGNOSIS — D6869 Other thrombophilia: Secondary | ICD-10-CM | POA: Diagnosis not present

## 2020-04-10 DIAGNOSIS — E781 Pure hyperglyceridemia: Secondary | ICD-10-CM | POA: Diagnosis not present

## 2020-04-10 DIAGNOSIS — L570 Actinic keratosis: Secondary | ICD-10-CM | POA: Diagnosis not present

## 2020-04-10 DIAGNOSIS — Z87891 Personal history of nicotine dependence: Secondary | ICD-10-CM | POA: Diagnosis not present

## 2020-04-10 DIAGNOSIS — K5790 Diverticulosis of intestine, part unspecified, without perforation or abscess without bleeding: Secondary | ICD-10-CM | POA: Diagnosis not present

## 2020-04-10 DIAGNOSIS — Z7982 Long term (current) use of aspirin: Secondary | ICD-10-CM | POA: Diagnosis not present

## 2020-04-10 DIAGNOSIS — Z9181 History of falling: Secondary | ICD-10-CM | POA: Diagnosis not present

## 2020-04-10 DIAGNOSIS — Z8673 Personal history of transient ischemic attack (TIA), and cerebral infarction without residual deficits: Secondary | ICD-10-CM | POA: Diagnosis not present

## 2020-04-10 DIAGNOSIS — Z96641 Presence of right artificial hip joint: Secondary | ICD-10-CM | POA: Diagnosis not present

## 2020-04-10 DIAGNOSIS — Z7901 Long term (current) use of anticoagulants: Secondary | ICD-10-CM | POA: Diagnosis not present

## 2020-04-10 DIAGNOSIS — I10 Essential (primary) hypertension: Secondary | ICD-10-CM | POA: Diagnosis not present

## 2020-04-10 DIAGNOSIS — F32A Depression, unspecified: Secondary | ICD-10-CM | POA: Diagnosis not present

## 2020-04-10 DIAGNOSIS — I480 Paroxysmal atrial fibrillation: Secondary | ICD-10-CM | POA: Diagnosis not present

## 2020-04-10 DIAGNOSIS — Z89012 Acquired absence of left thumb: Secondary | ICD-10-CM | POA: Diagnosis not present

## 2020-04-13 DIAGNOSIS — Z96641 Presence of right artificial hip joint: Secondary | ICD-10-CM | POA: Diagnosis not present

## 2020-04-13 DIAGNOSIS — Z7901 Long term (current) use of anticoagulants: Secondary | ICD-10-CM | POA: Diagnosis not present

## 2020-04-13 DIAGNOSIS — Z8673 Personal history of transient ischemic attack (TIA), and cerebral infarction without residual deficits: Secondary | ICD-10-CM | POA: Diagnosis not present

## 2020-04-13 DIAGNOSIS — I10 Essential (primary) hypertension: Secondary | ICD-10-CM | POA: Diagnosis not present

## 2020-04-13 DIAGNOSIS — Z87891 Personal history of nicotine dependence: Secondary | ICD-10-CM | POA: Diagnosis not present

## 2020-04-13 DIAGNOSIS — D6869 Other thrombophilia: Secondary | ICD-10-CM | POA: Diagnosis not present

## 2020-04-13 DIAGNOSIS — L570 Actinic keratosis: Secondary | ICD-10-CM | POA: Diagnosis not present

## 2020-04-13 DIAGNOSIS — K5790 Diverticulosis of intestine, part unspecified, without perforation or abscess without bleeding: Secondary | ICD-10-CM | POA: Diagnosis not present

## 2020-04-13 DIAGNOSIS — Z89012 Acquired absence of left thumb: Secondary | ICD-10-CM | POA: Diagnosis not present

## 2020-04-13 DIAGNOSIS — E781 Pure hyperglyceridemia: Secondary | ICD-10-CM | POA: Diagnosis not present

## 2020-04-13 DIAGNOSIS — I480 Paroxysmal atrial fibrillation: Secondary | ICD-10-CM | POA: Diagnosis not present

## 2020-04-13 DIAGNOSIS — Z9181 History of falling: Secondary | ICD-10-CM | POA: Diagnosis not present

## 2020-04-13 DIAGNOSIS — Z7982 Long term (current) use of aspirin: Secondary | ICD-10-CM | POA: Diagnosis not present

## 2020-04-13 DIAGNOSIS — F32A Depression, unspecified: Secondary | ICD-10-CM | POA: Diagnosis not present

## 2020-04-22 DIAGNOSIS — Z7901 Long term (current) use of anticoagulants: Secondary | ICD-10-CM | POA: Diagnosis not present

## 2020-04-22 DIAGNOSIS — D6869 Other thrombophilia: Secondary | ICD-10-CM | POA: Diagnosis not present

## 2020-04-22 DIAGNOSIS — I10 Essential (primary) hypertension: Secondary | ICD-10-CM | POA: Diagnosis not present

## 2020-04-22 DIAGNOSIS — L570 Actinic keratosis: Secondary | ICD-10-CM | POA: Diagnosis not present

## 2020-04-22 DIAGNOSIS — I480 Paroxysmal atrial fibrillation: Secondary | ICD-10-CM | POA: Diagnosis not present

## 2020-04-22 DIAGNOSIS — Z9181 History of falling: Secondary | ICD-10-CM | POA: Diagnosis not present

## 2020-04-22 DIAGNOSIS — Z89012 Acquired absence of left thumb: Secondary | ICD-10-CM | POA: Diagnosis not present

## 2020-04-22 DIAGNOSIS — F32A Depression, unspecified: Secondary | ICD-10-CM | POA: Diagnosis not present

## 2020-04-22 DIAGNOSIS — Z96641 Presence of right artificial hip joint: Secondary | ICD-10-CM | POA: Diagnosis not present

## 2020-04-22 DIAGNOSIS — K5790 Diverticulosis of intestine, part unspecified, without perforation or abscess without bleeding: Secondary | ICD-10-CM | POA: Diagnosis not present

## 2020-04-22 DIAGNOSIS — Z7982 Long term (current) use of aspirin: Secondary | ICD-10-CM | POA: Diagnosis not present

## 2020-04-22 DIAGNOSIS — Z87891 Personal history of nicotine dependence: Secondary | ICD-10-CM | POA: Diagnosis not present

## 2020-04-22 DIAGNOSIS — Z8673 Personal history of transient ischemic attack (TIA), and cerebral infarction without residual deficits: Secondary | ICD-10-CM | POA: Diagnosis not present

## 2020-04-22 DIAGNOSIS — E781 Pure hyperglyceridemia: Secondary | ICD-10-CM | POA: Diagnosis not present

## 2020-04-29 ENCOUNTER — Ambulatory Visit (INDEPENDENT_AMBULATORY_CARE_PROVIDER_SITE_OTHER): Payer: Medicare Other | Admitting: *Deleted

## 2020-04-29 ENCOUNTER — Other Ambulatory Visit: Payer: Self-pay

## 2020-04-29 DIAGNOSIS — Z5181 Encounter for therapeutic drug level monitoring: Secondary | ICD-10-CM | POA: Diagnosis not present

## 2020-04-29 DIAGNOSIS — I6789 Other cerebrovascular disease: Secondary | ICD-10-CM | POA: Diagnosis not present

## 2020-04-29 DIAGNOSIS — I4891 Unspecified atrial fibrillation: Secondary | ICD-10-CM

## 2020-04-29 LAB — POCT INR: INR: 2.9 (ref 2.0–3.0)

## 2020-04-29 NOTE — Patient Instructions (Signed)
Description   Continue taking Warfarin 1 tablet daily except 1.5 tablets on Mondays. Recheck in 6 weeks. Call us with any medication changes [phone]

## 2020-05-04 ENCOUNTER — Other Ambulatory Visit: Payer: Self-pay | Admitting: Interventional Cardiology

## 2020-05-09 DIAGNOSIS — J0100 Acute maxillary sinusitis, unspecified: Secondary | ICD-10-CM | POA: Diagnosis not present

## 2020-05-15 DIAGNOSIS — M7989 Other specified soft tissue disorders: Secondary | ICD-10-CM | POA: Diagnosis not present

## 2020-05-26 ENCOUNTER — Other Ambulatory Visit: Payer: Self-pay | Admitting: Interventional Cardiology

## 2020-05-30 ENCOUNTER — Other Ambulatory Visit: Payer: Self-pay | Admitting: Interventional Cardiology

## 2020-06-04 DIAGNOSIS — M25512 Pain in left shoulder: Secondary | ICD-10-CM | POA: Diagnosis not present

## 2020-06-04 DIAGNOSIS — I83019 Varicose veins of right lower extremity with ulcer of unspecified site: Secondary | ICD-10-CM | POA: Diagnosis not present

## 2020-06-04 DIAGNOSIS — L97919 Non-pressure chronic ulcer of unspecified part of right lower leg with unspecified severity: Secondary | ICD-10-CM | POA: Diagnosis not present

## 2020-06-04 DIAGNOSIS — I872 Venous insufficiency (chronic) (peripheral): Secondary | ICD-10-CM | POA: Diagnosis not present

## 2020-06-10 ENCOUNTER — Ambulatory Visit (INDEPENDENT_AMBULATORY_CARE_PROVIDER_SITE_OTHER): Payer: Medicare Other

## 2020-06-10 ENCOUNTER — Other Ambulatory Visit: Payer: Self-pay

## 2020-06-10 DIAGNOSIS — I4891 Unspecified atrial fibrillation: Secondary | ICD-10-CM | POA: Diagnosis not present

## 2020-06-10 DIAGNOSIS — Z5181 Encounter for therapeutic drug level monitoring: Secondary | ICD-10-CM

## 2020-06-10 DIAGNOSIS — I6789 Other cerebrovascular disease: Secondary | ICD-10-CM | POA: Diagnosis not present

## 2020-06-10 LAB — POCT INR: INR: 2 (ref 2.0–3.0)

## 2020-06-10 NOTE — Patient Instructions (Signed)
Description   Continue taking Warfarin 1 tablet daily except 1.5 tablets on Mondays. Recheck in 6 weeks. Call us with any medication changes [phone]

## 2020-06-11 ENCOUNTER — Encounter: Payer: Self-pay | Admitting: Physician Assistant

## 2020-06-11 ENCOUNTER — Ambulatory Visit: Payer: Self-pay

## 2020-06-11 ENCOUNTER — Ambulatory Visit: Payer: Medicare Other | Admitting: Physician Assistant

## 2020-06-11 DIAGNOSIS — M19011 Primary osteoarthritis, right shoulder: Secondary | ICD-10-CM

## 2020-06-11 DIAGNOSIS — M19012 Primary osteoarthritis, left shoulder: Secondary | ICD-10-CM

## 2020-06-11 MED ORDER — TRIAMCINOLONE ACETONIDE 40 MG/ML IJ SUSP
40.0000 mg | INTRAMUSCULAR | Status: AC | PRN
  Administered 2020-06-11: 40 mg via INTRA_ARTICULAR

## 2020-06-11 MED ORDER — BUPIVACAINE HCL 0.5 % IJ SOLN
3.0000 mL | INTRAMUSCULAR | Status: AC | PRN
  Administered 2020-06-11: 3 mL via INTRA_ARTICULAR

## 2020-06-11 NOTE — Progress Notes (Signed)
Office Visit Note   Patient: [name]           Date of Birth: [birth year]           MRN: [id number] Visit Date: 06/11/2020              Requested by: Gaynelle Arabian, MD [address] [place],  [place] [address] PCP: Gaynelle Arabian, MD   Assessment & Plan: Visit Diagnoses:  1. Primary osteoarthritis of right shoulder   2. Primary osteoarthritis of left shoulder     Plan: We will send him for intra-articular injections both shoulders with Dr. Ernestina Patches.  He will follow-up with [place] as needed.  Questions were encouraged and answered at length today with patient and his son is present today.  Follow-Up Instructions: Return if symptoms worsen or fail to improve.   Orders:  Orders Placed This Encounter  Procedures  . Large Joint Inj: bilateral glenohumeral  . XR Shoulder Right  . XR C-ARM NO REPORT   No orders of the defined types were placed in this encounter.     Procedures: No procedures performed   Clinical Data: No additional findings.   Subjective: Chief Complaint  Patient presents with  . Left Shoulder - Pain  . Right Shoulder - Pain    HPI [name] comes in today with bilateral shoulder pain.  He has known severe end-stage arthritis of his left shoulder.  He had an injection in the left shoulder in September 2021 which gave him good relief.  He has had pain in both shoulders over the last few weeks.  He has had no treatment.  He is taken Tylenol for the pain.  He is nondiabetic.  Review of Systems  Constitutional: Negative for chills and fever.  Respiratory: Negative for shortness of breath.   Cardiovascular: Negative for chest pain.     Objective: Vital Signs: There were no vitals taken for this visit.  Physical Exam Constitutional:      Appearance: He is not ill-appearing or diaphoretic.  Neurological:     Mental Status: He is alert and oriented to person, place, and time.  Psychiatric:        Mood and Affect: Mood normal.      Ortho Exam Bilateral shoulders has 5 out of 5 strength of external and internal rotation against resistance.  Impingement testing reveals significant crepitus and causes discomfort bilaterally.  He has limited rotation of both shoulders.  Limited overhead activity both shoulders. Specialty Comments:  No specialty comments available.  Imaging: XR C-ARM NO REPORT  Result Date: 06/11/2020 Please see Notes tab for imaging impression.  XR Shoulder Right  Result Date: 06/11/2020 Right shoulder: High riding shoulder consistent with chronic shoulder arthropathy.  AC joint changes.  No acute fractures.  Cystic changes within the humeral head.  Decreased glenohumeral joint space.    PMFS History: Patient Active Problem List   Diagnosis Date Noted  . Subdural hematoma ([facility]) 10/22/2015  . Thrombocytopenia ([place]) 10/22/2015  . Chronic venous stasis dermatitis 10/22/2015  . Fall   . Head injury   . Essential hypertension 01/21/2014  . Embolic stroke ([facility]) 32/99/2426  . Long term current use of anticoagulant therapy 12/25/2012  . Atrial fibrillation ([facility] [facility]) 10/15/2012  . Acute, but ill-defined, cerebrovascular disease 10/15/2012   Past Medical History:  Diagnosis Date  . Atrial fibrillation ([facility]) 10/15/2012  . Cancer (HCC)    Skin (R hand)  . Embolic stroke ([place]) 83/41/9622  . Essential  hypertension 01/21/2014  . Long term current use of anticoagulant therapy 12/25/2012    Family History  Problem Relation Age of Onset  . Other Mother        Brain hemmorrhage  . Other Father        unknown    Past Surgical History:  Procedure Laterality Date  . APPENDECTOMY    . CARDIAC CATHETERIZATION    . CYSTOSCOPY WITH INSERTION OF UROLIFT N/A 10/10/2017   Procedure: CYSTOSCOPY WITH INSERTION OF UROLIFT;  Surgeon: [name], MD;  Location: [facility];  Service: Urology;  Laterality: N/A;  NEEDS 60 MIN  . EYE SURGERY Bilateral 2013   Cataracts   Social History   Occupational History   . Not on file  Tobacco Use  . Smoking status: Former Smoker    Packs/day: 0.25    Years: 10.00    Pack years: 2.50    Types: Cigarettes  . Smokeless tobacco: Never Used  Vaping Use  . Vaping Use: Never used  Substance and Sexual Activity  . Alcohol use: No    Alcohol/week: 0.0 standard drinks  . Drug use: No  . Sexual activity: Not on file

## 2020-06-11 NOTE — Progress Notes (Signed)
   [name] - 87 y.o. male MRN [id number]  Date of birth: December 17, 1930  Office Visit Note: Visit Date: 06/11/2020 PCP: Gaynelle Arabian, MD Referred by: Gaynelle Arabian, MD  Subjective: Chief Complaint  Patient presents with  . Left Shoulder - Pain  . Right Shoulder - Pain   HPI:  [name] is a 87 y.o. male who comes in today at the request of Benita Stabile, PA-C for planned Bilateral glenohumeral arthrograms with fluoroscopic guidance.  The patient has failed conservative care including home exercise, medications, time and activity modification.  This injection will be diagnostic and hopefully therapeutic.  Please see requesting physician notes for further details and justification.   ROS Otherwise per HPI.  Assessment & Plan: Visit Diagnoses:    ICD-10-CM   1. Right shoulder pain, unspecified chronicity  M25.511 XR Shoulder Right    Large Joint Inj: bilateral glenohumeral    XR C-ARM NO REPORT    Plan: No additional findings.   Meds & Orders: No orders of the defined types were placed in this encounter.   Orders Placed This Encounter  Procedures  . Large Joint Inj: bilateral glenohumeral  . XR Shoulder Right  . XR C-ARM NO REPORT    Follow-up: Benita Stabile, PA-C as scheduled  Procedures: Large Joint Inj: bilateral glenohumeral on 06/11/2020 4:10 PM Indications: pain and diagnostic evaluation Details: 22 G 3.5 in needle, fluoroscopy-guided anteromedial approach  Arthrogram: No  Medications (Right): 40 mg triamcinolone acetonide 40 MG/ML; 3 mL bupivacaine 0.5 % Medications (Left): 40 mg triamcinolone acetonide 40 MG/ML; 3 mL bupivacaine 0.5 % Outcome: tolerated well, no immediate complications  There was excellent flow of contrast producing a partial arthrogram of the glenohumeral joints. The patient did have relief of symptoms during the anesthetic phase of the injection. Procedure, treatment alternatives, risks and benefits explained, specific risks discussed. Consent  was given by the patient. Immediately prior to procedure a time out was called to verify the correct patient, procedure, equipment, support staff and site/side marked as required. Patient was prepped and draped in the usual sterile fashion.          Clinical History: No specialty comments available.     Objective:  VS:  HT:    WT:   BMI:     BP:   HR: bpm  TEMP: ( )  RESP:  Physical Exam Musculoskeletal:     Comments: Decreased ROM both shoulders, significant OA of both hands.  Skin:    Comments: Right lower latera leg ulcer.      Imaging: No results found.

## 2020-06-12 ENCOUNTER — Other Ambulatory Visit: Payer: Self-pay

## 2020-06-12 ENCOUNTER — Encounter: Payer: Medicare Other | Attending: Physician Assistant | Admitting: Physician Assistant

## 2020-06-12 DIAGNOSIS — S51011A Laceration without foreign body of right elbow, initial encounter: Secondary | ICD-10-CM | POA: Diagnosis not present

## 2020-06-12 DIAGNOSIS — I872 Venous insufficiency (chronic) (peripheral): Secondary | ICD-10-CM | POA: Diagnosis not present

## 2020-06-12 DIAGNOSIS — Z7901 Long term (current) use of anticoagulants: Secondary | ICD-10-CM | POA: Insufficient documentation

## 2020-06-12 DIAGNOSIS — X58XXXA Exposure to other specified factors, initial encounter: Secondary | ICD-10-CM | POA: Insufficient documentation

## 2020-06-12 DIAGNOSIS — L97812 Non-pressure chronic ulcer of other part of right lower leg with fat layer exposed: Secondary | ICD-10-CM | POA: Insufficient documentation

## 2020-06-12 DIAGNOSIS — I890 Lymphedema, not elsewhere classified: Secondary | ICD-10-CM | POA: Diagnosis not present

## 2020-06-12 NOTE — Progress Notes (Signed)
[name], [name] ([id number]) Visit Report for 06/12/2020 Abuse/Suicide Risk Screen Details Patient Name: [name], [name]. Date of Service: 06/12/2020 9:45 AM Medical Record Number: [id number] Patient Account Number: [id number] Date of Birth/Sex: [birth year] (87 y.o. M) Treating RN: Dolan Amen Primary Care Benjamen Koelling: Gaynelle Arabian Other Clinician: Referring Katelinn Justice: Early Osmond Treating Diann Bangerter/Extender: Skipper Cliche in Treatment: 0 Abuse/Suicide Risk Screen Items Answer ABUSE RISK SCREEN: Has anyone close to you tried to hurt or harm you recentlyo No Do you feel uncomfortable with anyone in your familyo No Has anyone forced you do things that you didnot want to doo No Electronic Signature(s) Signed: 06/12/2020 12:01:55 PM By: Georges Mouse, Minus Breeding RN Entered By: Georges Mouse, Kenia on 06/12/2020 09:53:16 [name] ([id number]) -------------------------------------------------------------------------------- Activities of Daily Living Details Patient Name: [name], [name]. Date of Service: 06/12/2020 9:45 AM Medical Record Number: [id number] Patient Account Number: [id number] Date of Birth/Sex: 1930-01-22 (87 y.o. M) Treating RN: Dolan Amen Primary Care Kden Wagster: Gaynelle Arabian Other Clinician: Referring Tenleigh Byer: Early Osmond Treating Christelle Igoe/Extender: Skipper Cliche in Treatment: 0 Activities of Daily Living Items Answer Activities of Daily Living (Please select one for each item) Drive Automobile Not Able Take Medications Completely Able Use Telephone Completely Able Care for Appearance Completely Able Use Toilet Completely Able Bath / Shower Completely Able Dress Self Completely Able Feed Self Completely Able Walk Completely Able Get In / Out Bed Completely Able Housework Completely Able Prepare Meals Completely [place] for Self Completely Able Electronic Signature(s) Signed: 06/12/2020 12:01:55 PM By: Georges Mouse, Minus Breeding RN Entered By: Georges Mouse, Minus Breeding on 06/12/2020 09:54:07 [name] ([id number]) -------------------------------------------------------------------------------- Education Screening Details Patient Name: [name] Date of Service: 06/12/2020 9:45 AM Medical Record Number: [id number] Patient Account Number: [id number] Date of Birth/Sex: Apr 11, 1930 (87 y.o. M) Treating RN: Dolan Amen Primary Care Jahson Emanuele: Gaynelle Arabian Other Clinician: Referring Blease Capaldi: Early Osmond Treating Shanese Riemenschneider/Extender: Skipper Cliche in Treatment: 0 Primary Learner Assessed: Patient Learning Preferences/Education Level/Primary Language Learning Preference: Explanation, Demonstration Highest Education Level: Grade School Preferred Language: English Cognitive Barrier Language Barrier: No Translator Needed: No Memory Deficit: No Emotional Barrier: No Cultural/Religious Beliefs Affecting Medical Care: No Physical Barrier Impaired Vision: No Impaired Hearing: No Decreased Hand dexterity: No Knowledge/Comprehension Knowledge Level: Medium Comprehension Level: Medium Ability to understand written instructions: Medium Ability to understand verbal instructions: Medium Motivation Anxiety Level: Calm Cooperation: Cooperative Education Importance: Acknowledges Need Interest in Health Problems: Asks Questions Perception: Coherent Willingness to Engage in Self-Management Medium Activities: Readiness to Engage in Self-Management Medium Activities: Electronic Signature(s) Signed: 06/12/2020 12:01:55 PM By: Georges Mouse, Minus Breeding RN Entered By: Georges Mouse, Minus Breeding on 06/12/2020 09:54:48 [name] ([id number]) -------------------------------------------------------------------------------- Fall Risk Assessment Details Patient Name: [name] Date of Service: 06/12/2020 9:45 AM Medical Record Number: [id number] Patient Account Number: [id number] Date of  Birth/Sex: Apr 18, 1930 (87 y.o. M) Treating RN: Dolan Amen Primary Care Vivyan Biggers: Gaynelle Arabian Other Clinician: Referring [place] Whedbee: Early Osmond Treating Somnang Mahan/Extender: Skipper Cliche in Treatment: 0 Fall Risk Assessment Items Have you had 2 or more falls in the last 12 monthso 0 Yes Have you had any fall that resulted in injury in the last 12 monthso 0 No FALLS RISK SCREEN History of falling - immediate or within 3 months 0 No Secondary diagnosis (Do you have 2 or more medical diagnoseso) 15 Yes Ambulatory aid None/bed rest/wheelchair/nurse 0 No Crutches/cane/walker 15 Yes Furniture 0 No Intravenous therapy Access/Saline/Heparin Lock 0 No Gait/Transferring Normal/ bed rest/ wheelchair  0 Yes Weak (short steps with or without shuffle, stooped but able to lift head while walking, may 0 No seek support from furniture) Impaired (short steps with shuffle, may have difficulty arising from chair, head down, impaired 0 No balance) Mental Status Oriented to own ability 0 Yes Electronic Signature(s) Signed: 06/12/2020 12:01:55 PM By: Georges Mouse, Minus Breeding RN Entered By: Georges Mouse, Kenia on 06/12/2020 09:55:27 [name] ([id number]) -------------------------------------------------------------------------------- Foot Assessment Details Patient Name: [name] Date of Service: 06/12/2020 9:45 AM Medical Record Number: [id number] Patient Account Number: [id number] Date of Birth/Sex: [birth year] (87 y.o. M) Treating RN: Dolan Amen Primary Care Nakaila Freeze: Gaynelle Arabian Other Clinician: Referring Devora Tortorella: Early Osmond Treating Devon Kingdon/Extender: Skipper Cliche in Treatment: 0 Foot Assessment Items Site Locations + = Sensation present, - = Sensation absent, C = Callus, U = Ulcer R = Redness, W = Warmth, M = Maceration, PU = Pre-ulcerative lesion F = Fissure, S = Swelling, D = Dryness Assessment Right: Left: Other Deformity: No No Prior Foot Ulcer:  No No Prior Amputation: No No Charcot Joint: No No Ambulatory Status: Ambulatory With Help Assistance Device: Walker Gait: Steady Electronic Signature(s) Signed: 06/12/2020 12:01:55 PM By: Georges Mouse, Minus Breeding RN Entered By: Georges Mouse, Minus Breeding on 06/12/2020 10:01:37 [name] ([id number]) -------------------------------------------------------------------------------- Nutrition Risk Screening Details Patient Name: [name] Date of Service: 06/12/2020 9:45 AM Medical Record Number: [id number] Patient Account Number: [id number] Date of Birth/Sex: September 30, 1930 (87 y.o. M) Treating RN: Dolan Amen Primary Care Jenevieve Kirschbaum: Gaynelle Arabian Other Clinician: Referring Topher Bu[place]: Early Osmond Treating Brannen Koppen/Extender: Skipper Cliche in Treatment: 0 Height (in): 71 Weight (lbs): 209 Body Mass Index (BMI): 29.1 Nutrition Risk Screening Items Score Screening NUTRITION RISK SCREEN: I have an illness or condition that made me change the kind and/or amount of food I eat 0 No I eat fewer than two meals per day 0 No I eat few fruits and vegetables, or milk products 0 No I have three or more drinks of beer, liquor or wine almost every day 0 No I have tooth or mouth problems that make it hard for me to eat 0 No I don't always have enough money to buy the food I need 0 No I eat alone most of the time 0 No I take three or more different prescribed or over-the-counter drugs a day 1 Yes Without wanting to, I have lost or gained 10 pounds in the last six months 0 No I am not always physically able to shop, cook and/or feed myself 0 No Nutrition Protocols Good Risk Protocol 0 No interventions needed Moderate Risk Protocol High Risk Proctocol Risk Level: Good Risk Score: 1 Electronic Signature(s) Signed: 06/12/2020 12:01:55 PM By: Georges Mouse, Minus Breeding RN Entered By: Georges Mouse, Minus Breeding on 06/12/2020 09:56:15

## 2020-06-13 ENCOUNTER — Other Ambulatory Visit: Payer: Self-pay | Admitting: Interventional Cardiology

## 2020-06-15 ENCOUNTER — Other Ambulatory Visit: Payer: Self-pay

## 2020-06-15 DIAGNOSIS — S51011A Laceration without foreign body of right elbow, initial encounter: Secondary | ICD-10-CM | POA: Diagnosis not present

## 2020-06-15 DIAGNOSIS — I872 Venous insufficiency (chronic) (peripheral): Secondary | ICD-10-CM | POA: Diagnosis not present

## 2020-06-15 DIAGNOSIS — Z7901 Long term (current) use of anticoagulants: Secondary | ICD-10-CM | POA: Diagnosis not present

## 2020-06-15 DIAGNOSIS — I890 Lymphedema, not elsewhere classified: Secondary | ICD-10-CM | POA: Diagnosis not present

## 2020-06-15 DIAGNOSIS — L97812 Non-pressure chronic ulcer of other part of right lower leg with fat layer exposed: Secondary | ICD-10-CM | POA: Diagnosis not present

## 2020-06-15 NOTE — Telephone Encounter (Signed)
Prescription refill request received for warfarin Lov: [name], 01/24/2019 Next INR check: 7/13 Warfarin tablet strength: 5mg 

## 2020-06-16 NOTE — Progress Notes (Signed)
MCDANIEL, OHMS ([id number]) Visit Report for 06/12/2020 Chief Complaint Document Details Patient Name: [name], [name]. Date of Service: 06/12/2020 9:45 AM Medical Record Number: [id number] Patient Account Number: [id number] Date of Birth/Sex: 1930-04-09 (87 y.o. M) Treating RN: Carlene Coria Primary Care Provider: Gaynelle Arabian Other Clinician: Referring Provider: Gaynelle Arabian Treating Provider/Extender: Skipper Cliche in Treatment: 0 Information Obtained from: Patient Chief Complaint Right LE Ulcers Electronic Signature(s) Signed: 06/12/2020 10:32:30 AM By: Worthy Keeler PA-C Entered By: Worthy Keeler on 06/12/2020 10:32:30 [name] ([id number]) -------------------------------------------------------------------------------- Debridement Details Patient Name: [name] Date of Service: 06/12/2020 9:45 AM Medical Record Number: [id number] Patient Account Number: [id number] Date of Birth/Sex: January 10, 1931 (87 y.o. M) Treating RN: Carlene Coria Primary Care Provider: Gaynelle Arabian Other Clinician: Referring Provider: Gaynelle Arabian Treating Provider/Extender: Skipper Cliche in Treatment: 0 Debridement Performed for Wound #5 Right,Lateral Lower Leg Assessment: Performed By: Physician Tommie Sams., PA-C Debridement Type: Debridement Severity of Tissue Pre Debridement: Fat layer exposed Level of Consciousness (Pre- Awake and Alert procedure): Pre-procedure Verification/Time Out Yes - 10:35 Taken: Start Time: 10:35 Pain Control: Lidocaine 4% Topical Solution Total Area Debrided (L x W): 3.3 (cm) x 2.9 (cm) = 9.57 (cm) Tissue and other material Viable, Non-Viable, Slough, Subcutaneous, Biofilm, Slough debrided: Level: Skin/Subcutaneous Tissue Debridement Description: Excisional Instrument: Curette Bleeding: Moderate Hemostasis Achieved: Pressure End Time: 10:39 Procedural Pain: 0 Post Procedural Pain: 0 Response to Treatment: Procedure was tolerated  well Level of Consciousness (Post- Awake and Alert procedure): Post Debridement Measurements of Total Wound Length: (cm) 3.3 Width: (cm) 2.9 Depth: (cm) 0.1 Volume: (cm) 0.752 Character of Wound/Ulcer Post Debridement: Improved Severity of Tissue Post Debridement: Fat layer exposed Post Procedure Diagnosis Same as Pre-procedure Electronic Signature(s) Signed: 06/12/2020 5:30:36 PM By: Worthy Keeler PA-C Signed: 06/16/2020 8:01:20 AM By: Carlene Coria RN Entered By: Carlene Coria on 06/12/2020 10:42:51 [name] ([id number]) -------------------------------------------------------------------------------- HPI Details Patient Name: [name] Date of Service: 06/12/2020 9:45 AM Medical Record Number: [id number] Patient Account Number: [id number] Date of Birth/Sex: 1930-12-15 (87 y.o. M) Treating RN: Carlene Coria Primary Care Provider: Gaynelle Arabian Other Clinician: Referring Provider: Gaynelle Arabian Treating Provider/Extender: Skipper Cliche in Treatment: 0 History of Present Illness HPI Description: 87 year old gentleman has been referred to [place] by his PCP Dr. Gaynelle Arabian, for a ulcer on the left posterior lower leg which has been there for about 6 weeks. The patient was thought to have an infection and was put on doxycycline for 14 days and referred to the wound center. He has a past medical history of paroxysmal atrial fibrillation, hypertension, diverticulosis, status post right hip replacement, appendectomy. He has not been a smoker since 6. the anterior shin wound has been more recent about 2 weeks and this was caused probably due to a blunt abrasion. 03/06/17 on evaluation today patient's ulcer appears to show signs of excellent improvement. He is definitely showing additional evidence of granulation which is great news. Overall I'm extremely pleased with the progress that he has been making from the beginning to where we are right now. With that being said I  would like to potentially try different dressing to see if we could stimulate this to heal even faster. 03/13/17 on evaluation today patient appears to be doing little better in regard to his leg ulcer at this time. He has been tolerating the dressing changes without complication. There does not appear to be any evidence of infection which is good news. He does  have some additional granulation observed at this point and I do believe that the silver collagen dressing has been of benefit in this regard. Overall I'm very pleased with the progress she's made since last visit the only downside is that he does have additional maceration due to drainage compared to previous we may need to do something to address this. 03/27/17 on evaluation today patient appears to be doing well in regard to his left posterior lower surety ulcer. He has a significant area of epithelial growth which seems to be doing excellent at this point in time. He also seems to be well controlled as far as slough is concerned and drainage there is no evidence of maceration and there really was no slough that could not be cleaned off with saline and gauze. Overall patient seems to be making excellent progress. 04/10/17 on evaluation today patient appears to be doing better in regard to the original ulcer for which we have been treating him. Unfortunately he has two areas where he sustained injury to his posterior leg more proximal to where the current ulceration is. This was due to it appears to be tape and adhesive at this point. When he was using the Allevyn dressing there was no problems however they ran out of these and since that point there seems to have been more irritation around the wound from the Band-Aids that his wife has been using in their place. Other than this which appear to be very superficial new ulcers everything appears to be progressing nicely. 04/24/17 on evaluation today patient appears to be doing excellent in regard to  his left posterior lower extremity ulcer. He is been tolerating the dressing changes without complication fortunately there does not appear to be any evidence of infection at this point in time. Overall he has made significant improvements since I last saw him. 05/08/17 on evaluation today patient's wound appears to be completely healed at this point. Fortunately this is great news and though it is closed over with epithelium there still some healing to be done underneath for this completely toughen up this was explained to the patient and his wife today as well. Readmission: 06/12/2020 this is a gentleman whom I have previously seen here in the clinic before in regards to similar issues with wounds on his lower extremities. 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 He is also very swollen the skin is tense and even shiny due to the fluid buildup. Obviously I think this is definitely something that we need to address sooner rather than later as far as that is concerned. Fortunately there does not appear to be any signs of active infection he is on a blood thinner, Coumadin, this is due to chronic atrial fibrillation. Electronic Signature(s) Signed: 06/12/2020 5:28:20 PM By: Worthy Keeler PA-C Entered By: Worthy Keeler on 06/12/2020 17:28:19 [name] ([id number]) -------------------------------------------------------------------------------- Physical Exam Details Patient Name: [name], [name] Date of Service: 06/12/2020 9:45 AM Medical Record Number: [id number] Patient Account Number: [id number] Date of Birth/Sex: 1930-09-23 (87 y.o. M) Treating RN: Carlene Coria Primary Care Provider: Gaynelle Arabian Other Clinician: Referring Provider: Gaynelle Arabian Treating Provider/Extender: Skipper Cliche in Treatment: 0 Constitutional patient is  hypertensive.. pulse regular and within target range for patient.Marland Kitchen respirations regular, non-labored and within target range for patient.Marland Kitchen temperature within target range for patient.. Well-nourished and  well-hydrated in no acute distress. Eyes conjunctiva clear no eyelid edema noted. pupils equal round and reactive to light and accommodation. Ears, Nose, Mouth, and Throat no gross abnormality of ear auricles or external auditory canals. normal hearing noted during conversation. mucus membranes moist. Respiratory normal breathing without difficulty. Cardiovascular 1+ dorsalis pedis/posterior tibialis pulses. 2+ pitting edema of the bilateral lower extremities. Musculoskeletal Patient unable to walk without assistance. no significant deformity or arthritic changes, no loss or range of motion, no clubbing. Psychiatric this patient is able to make decisions and demonstrates good insight into disease process. Alert and Oriented x 3. pleasant and cooperative. Notes Upon inspection patient's wound bed actually showed signs of some slough and biofilm buildup on the surface of the wound. For that reason I discussed with him that I do believe a sharp debridement to carefully clean this away will help promote appropriate healing and he is in agreement with that plan. For that reason I did perform sharp debridement to clear away some of the necrotic debris here he tolerated that without complication postdebridement the wound bed appears to be doing significantly better which is great news. Electronic Signature(s) Signed: 06/12/2020 5:28:54 PM By: Worthy Keeler PA-C Entered By: Worthy Keeler on 06/12/2020 17:28:54 [name] ([id number]) -------------------------------------------------------------------------------- Physician Orders Details Patient Name: [name] Date of Service: 06/12/2020 9:45 AM Medical Record Number: [id number] Patient Account Number: [id number] Date of Birth/Sex:  [birth year] (87 y.o. M) Treating RN: Carlene Coria Primary Care Provider: Gaynelle Arabian Other Clinician: Referring Provider: Gaynelle Arabian Treating Provider/Extender: Skipper Cliche in Treatment: 0 Verbal / Phone Orders: No Diagnosis Coding ICD-10 Coding Code Description I87.2 Venous insufficiency (chronic) (peripheral) I89.0 Lymphedema, not elsewhere classified L97.812 Non-pressure chronic ulcer of other part of right lower leg with fat layer exposed I48.0 Paroxysmal atrial fibrillation Follow-up Appointments o Return Appointment in 1 week. - Nurse visit monday or tuesday o Nurse Visit as needed Bathing/ Shower/ Hygiene o May shower with wound dressing protected with water repellent cover or cast protector. Edema Control - Lymphedema / Segmental Compressive Device / Other o Optional: One layer of unna paste to top of compression wrap (to act as an anchor). o Elevate, Exercise Daily and Avoid Standing for Long Periods of Time. o Elevate legs to the level of the heart and pump ankles as often as possible o Elevate leg(s) parallel to the floor when sitting. Wound Treatment Wound #5 - Lower Leg Wound Laterality: Right, Lateral Cleanser: Soap and Water 2 x Per Week Discharge Instructions: Gently cleanse wound with antibacterial soap, rinse and pat dry prior to dressing wounds Primary Dressing: Prisma 4.34 (in) 2 x Per Week Discharge Instructions: Moisten w/normal saline or sterile water; Cover wound as directed. Do not remove from wound bed. Secondary Dressing: Xtrasorb Medium 4x5 (in/in) 2 x Per Week Discharge Instructions: Apply to wound as directed. Do not cut. Compression Wrap: Profore Lite LF 3 Multilayer Compression Bandaging System 2 x Per Week Discharge Instructions: Apply 3 multi-layer wrap as prescribed. Electronic Signature(s) Signed: 06/12/2020 5:30:36 PM By: Worthy Keeler PA-C Signed: 06/16/2020 8:01:20 AM By: Carlene Coria RN Entered By: Carlene Coria on  06/12/2020 10:48:05 [name] ([id number]) -------------------------------------------------------------------------------- Problem List Details Patient Name: [name], [name]. Date of Service: 06/12/2020 9:45 AM Medical Record Number: [id number] Patient Account Number: [id number] Date of Birth/Sex: 1930-07-11 (87 y.o. M) Treating RN: Carlene Coria Primary Care Provider: Gaynelle Arabian Other Clinician: Referring Provider: Gaynelle Arabian Treating Provider/Extender: Skipper Cliche in Treatment:  0 Active Problems ICD-10 Encounter Code Description Active Date MDM Diagnosis I87.2 Venous insufficiency (chronic) (peripheral) 06/12/2020 No Yes I89.0 Lymphedema, not elsewhere classified 06/12/2020 No Yes L97.812 Non-pressure chronic ulcer of other part of right lower leg with fat layer 06/12/2020 No Yes exposed I48.0 Paroxysmal atrial fibrillation 06/12/2020 No Yes Inactive Problems Resolved Problems Electronic Signature(s) Signed: 06/12/2020 10:32:14 AM By: Worthy Keeler PA-C Entered By: Worthy Keeler on 06/12/2020 10:32:14 [name] ([id number]) -------------------------------------------------------------------------------- Progress Note Details Patient Name: [name] Date of Service: 06/12/2020 9:45 AM Medical Record Number: [id number] Patient Account Number: [id number] Date of Birth/Sex: 01-13-1930 (87 y.o. M) Treating RN: Carlene Coria Primary Care Provider: Gaynelle Arabian Other Clinician: Referring Provider: Gaynelle Arabian Treating Provider/Extender: Skipper Cliche in Treatment: 0 Subjective Chief Complaint Information obtained from Patient Right LE Ulcers History of Present Illness (HPI) 87 year old gentleman has been referred to [place] by his PCP Dr. Gaynelle Arabian, for a ulcer on the left posterior lower leg which has been there for about 6 weeks. The patient was thought to have an infection and was put on doxycycline for 14 days and referred to the wound center.  He has a past medical history of paroxysmal atrial fibrillation, hypertension, diverticulosis, status post right hip replacement, appendectomy. He has not been a smoker since 35. the anterior shin wound has been more recent about 2 weeks and this was caused probably due to a blunt abrasion. 03/06/17 on evaluation today patient's ulcer appears to show signs of excellent improvement. He is definitely showing additional evidence of granulation which is great news. Overall I'm extremely pleased with the progress that he has been making from the beginning to where we are right now. With that being said I would like to potentially try different dressing to see if we could stimulate this to heal even faster. 03/13/17 on evaluation today patient appears to be doing little better in regard to his leg ulcer at this time. He has been tolerating the dressing changes without complication. There does not appear to be any evidence of infection which is good news. He does have some additional granulation observed at this point and I do believe that the silver collagen dressing has been of benefit in this regard. Overall I'm very pleased with the progress she's made since last visit the only downside is that he does have additional maceration due to drainage compared to previous we may need to do something to address this. 03/27/17 on evaluation today patient appears to be doing well in regard to his left posterior lower surety ulcer. He has a significant area of epithelial growth which seems to be doing excellent at this point in time. He also seems to be well controlled as far as slough is concerned and drainage there is no evidence of maceration and there really was no slough that could not be cleaned off with saline and gauze. Overall patient seems to be making excellent progress. 04/10/17 on evaluation today patient appears to be doing better in regard to the original ulcer for which we have been treating him.  Unfortunately he has two areas where he sustained injury to his posterior leg more proximal to where the current ulceration is. This was due to it appears to be tape and adhesive at this point. When he was using the Allevyn dressing there was no problems however they ran out of these and since that point there seems to have been more irritation around the wound from the Band-Aids that his  wife has been using in their place. Other than this which appear to be very superficial new ulcers everything appears to be progressing nicely. 04/24/17 on evaluation today patient appears to be doing excellent in regard to his left posterior lower extremity ulcer. He is been tolerating the dressing changes without complication fortunately there does not appear to be any evidence of infection at this point in time. Overall he has made significant improvements since I last saw him. 05/08/17 on evaluation today patient's wound appears to be completely healed at this point. Fortunately this is great news and though it is closed over with epithelium there still some healing to be done underneath for this completely toughen up this was explained to the patient and his wife today as well. Readmission: 06/12/2020 this is a gentleman whom I have previously seen here in the clinic before in regards to similar issues with wounds on his lower extremities. 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 He is also very swollen the skin is tense and even shiny due to the fluid buildup. Obviously I think this is definitely something that we need to address sooner rather than later as far as that is concerned. Fortunately there does not appear to be any signs of active infection he is on a blood thinner, Coumadin, this is due to chronic atrial fibrillation. Patient History Information obtained from  Patient. Allergies No Known Drug Allergies Family History Diabetes - Child, Hypertension - Child, No family history of Cancer, Heart Disease, Kidney Disease, Lung Disease, Seizures, Stroke, Thyroid Problems, Tuberculosis. Social History Former smoker - 94 years +, Marital Status - Married, Alcohol Use - Never, Drug Use - No History, Caffeine Use - Rarely. Medical History Eyes [name], [name] ([id number]) Patient has history of Cataracts Denies history of Glaucoma, Optic Neuritis Ear/Nose/Mouth/Throat Denies history of Chronic sinus problems/congestion, Middle ear problems Hematologic/Lymphatic Denies history of Anemia, Hemophilia, Human Immunodeficiency Virus, Lymphedema, Sickle Cell Disease Respiratory Denies history of Aspiration, Asthma, Chronic Obstructive Pulmonary Disease (COPD), Pneumothorax, Sleep Apnea, Tuberculosis Cardiovascular Patient has history of Arrhythmia - A- Fib, Hypertension Denies history of Angina, Congestive Heart Failure, Coronary Artery Disease, Deep Vein Thrombosis, Hypotension, Myocardial Infarction, Peripheral Arterial Disease, Peripheral Venous Disease, Phlebitis, Vasculitis Gastrointestinal Denies history of Cirrhosis , Colitis, Crohn s, Hepatitis A, Hepatitis B, Hepatitis C Endocrine Denies history of Type I Diabetes, Type II Diabetes Genitourinary Denies history of End Stage Renal Disease Immunological Denies history of Lupus Erythematosus, Raynaud s, Scleroderma Integumentary (Skin) Denies history of History of Burn, History of pressure wounds Musculoskeletal Denies history of Gout, Rheumatoid Arthritis, Osteoarthritis, Osteomyelitis Neurologic Denies history of Dementia, Neuropathy, Quadriplegia, Paraplegia, Seizure Disorder Oncologic Denies history of Received Chemotherapy, Received Radiation Psychiatric Denies history of Anorexia/bulimia, Confinement Anxiety Review of Systems (ROS) Constitutional Symptoms (General Health) Denies  complaints or symptoms of Fatigue, Fever, Chills, Marked Weight Change. Eyes Denies complaints or symptoms of Dry Eyes, Vision Changes, Glasses / Contacts. Ear/Nose/Mouth/Throat Denies complaints or symptoms of Difficult clearing ears, Sinusitis. Hematologic/Lymphatic Denies complaints or symptoms of Bleeding / Clotting Disorders, Human Immunodeficiency Virus. Respiratory Denies complaints or symptoms of Chronic or frequent coughs, Shortness of Breath. Cardiovascular Complains or has symptoms of LE edema. Denies complaints or symptoms of Chest pain. Gastrointestinal Denies complaints or symptoms of Frequent diarrhea, Nausea, Vomiting. Endocrine Denies complaints or symptoms of Hepatitis, Thyroid disease, Polydypsia (Excessive Thirst).  Genitourinary Denies complaints or symptoms of Kidney failure/ Dialysis, Incontinence/dribbling. Immunological Denies complaints or symptoms of Hives, Itching. Integumentary (Skin) Complains or has symptoms of Wounds, Swelling. Denies complaints or symptoms of Bleeding or bruising tendency, Breakdown. Musculoskeletal Denies complaints or symptoms of Muscle Pain, Muscle Weakness. Neurologic Denies complaints or symptoms of Numbness/parasthesias, Focal/Weakness. Psychiatric Denies complaints or symptoms of Anxiety, Claustrophobia. Objective Constitutional patient is hypertensive.. pulse regular and within target range for patient.Marland Kitchen respirations regular, non-labored and within target range for patient.Marland Kitchen temperature within target range for patient.. Well-nourished and well-hydrated in no acute distress. Vitals Time Taken: 9:47 AM, Height: 71 in, Source: Stated, Weight: 209 lbs, Source: Measured, BMI: 29.1, Temperature: 98.3 F, Pulse: 99 bpm, [name], Danny J. ([id number]) Respiratory Rate: 18 breaths/min, Blood Pressure: 149/88 mmHg. Eyes conjunctiva clear no eyelid edema noted. pupils equal round and reactive to light and accommodation. Ears, Nose,  Mouth, and Throat no gross abnormality of ear auricles or external auditory canals. normal hearing noted during conversation. mucus membranes moist. Respiratory normal breathing without difficulty. Cardiovascular 1+ dorsalis pedis/posterior tibialis pulses. 2+ pitting edema of the bilateral lower extremities. Musculoskeletal Patient unable to walk without assistance. no significant deformity or arthritic changes, no loss or range of motion, no clubbing. Psychiatric this patient is able to make decisions and demonstrates good insight into disease process. Alert and Oriented x 3. pleasant and cooperative. General Notes: Upon inspection patient's wound bed actually showed signs of some slough and biofilm buildup on the surface of the wound. For that reason I discussed with him that I do believe a sharp debridement to carefully clean this away will help promote appropriate healing and he is in agreement with that plan. For that reason I did perform sharp debridement to clear away some of the necrotic debris here he tolerated that without complication postdebridement the wound bed appears to be doing significantly better which is great news. Integumentary (Hair, Skin) Wound #5 status is Open. Original cause of wound was Gradually Appeared. The date acquired was: 04/29/2020. The wound is located on the Right,Lateral Lower Leg. The wound measures 3.3cm length x 2.9cm width x 0.1cm depth; 7.516cm^2 area and 0.752cm^3 volume. There is Fat Layer (Subcutaneous Tissue) exposed. There is no tunneling or undermining noted. There is a medium amount of serosanguineous drainage noted. There is small (1-33%) red granulation within the wound bed. There is a large (67-100%) amount of necrotic tissue within the wound bed including Adherent Slough. Assessment Active Problems ICD-10 Venous insufficiency (chronic) (peripheral) Lymphedema, not elsewhere classified Non-pressure chronic ulcer of other part of right  lower leg with fat layer exposed Paroxysmal atrial fibrillation Procedures Wound #5 Pre-procedure diagnosis of Wound #5 is a Venous Leg Ulcer located on the Right,Lateral Lower Leg .Severity of Tissue Pre Debridement is: Fat layer exposed. There was a Excisional Skin/Subcutaneous Tissue Debridement with a total area of 9.57 sq cm performed by Tommie Sams., PA-C. With the following instrument(s): Curette to remove Viable and Non-Viable tissue/material. Material removed includes Subcutaneous Tissue, Slough, and Biofilm after achieving pain control using Lidocaine 4% Topical Solution. No specimens were taken. A time out was conducted at 10:35, prior to the start of the procedure. A Moderate amount of bleeding was controlled with Pressure. The procedure was tolerated well with a pain level of 0 throughout and a pain level of 0 following the procedure. Post Debridement Measurements: 3.3cm length x 2.9cm width x 0.1cm depth; 0.752cm^3 volume. Character of Wound/Ulcer Post Debridement is improved. Severity of Tissue Post Debridement  is: Fat layer exposed. Post procedure Diagnosis Wound #5: Same as Pre-Procedure Plan Follow-up Appointments: Return Appointment in 1 week. - Nurse visit monday or tuesday Nurse Visit as needed [name], [name] ([id number]) Bathing/ Shower/ Hygiene: May shower with wound dressing protected with water repellent cover or cast protector. Edema Control - Lymphedema / Segmental Compressive Device / Other: Optional: One layer of unna paste to top of compression wrap (to act as an anchor). Elevate, Exercise Daily and Avoid Standing for Long Periods of Time. Elevate legs to the level of the heart and pump ankles as often as possible Elevate leg(s) parallel to the floor when sitting. WOUND #5: - Lower Leg Wound Laterality: Right, Lateral Cleanser: Soap and Water 2 x Per Week/ Discharge Instructions: Gently cleanse wound with antibacterial soap, rinse and pat dry prior to  dressing wounds Primary Dressing: Prisma 4.34 (in) 2 x Per Week/ Discharge Instructions: Moisten w/normal saline or sterile water; Cover wound as directed. Do not remove from wound bed. Secondary Dressing: Xtrasorb Medium 4x5 (in/in) 2 x Per Week/ Discharge Instructions: Apply to wound as directed. Do not cut. Compression Wrap: Profore Lite LF 3 Multilayer Compression Bandaging System 2 x Per Week/ Discharge Instructions: Apply 3 multi-layer wrap as prescribed. 1. Would recommend at this point that we going continue with the wound care measures as before and the patient is in agreement the plan. This includes the use of the silver collagen dressing which I do believe is going to be doing a good job for him. 2. I would also recommend XtraSorb to cover. 3. I am also going to suggest that we go ahead and start him with a 3 layer compression wrap to try to help control some of this edema which I think will also help the wound heal much more effectively and quickly. We will see patient back for reevaluation in 1 week here in the clinic. If anything worsens or changes patient will contact our office for additional recommendations. Electronic Signature(s) Signed: 06/12/2020 5:29:48 PM By: Worthy Keeler PA-C Entered By: Worthy Keeler on 06/12/2020 17:29:48 [name] ([id number]) -------------------------------------------------------------------------------- ROS/PFSH Details Patient Name: [name] Date of Service: 06/12/2020 9:45 AM Medical Record Number: [id number] Patient Account Number: [id number] Date of Birth/Sex: 08-03-1930 (87 y.o. M) Treating RN: Dolan Amen Primary Care Provider: Gaynelle Arabian Other Clinician: Referring Provider: Gaynelle Arabian Treating Provider/Extender: Skipper Cliche in Treatment: 0 Information Obtained From Patient Constitutional Symptoms (General Health) Complaints and Symptoms: Negative for: Fatigue; Fever; Chills; Marked Weight  Change Eyes Complaints and Symptoms: Negative for: Dry Eyes; Vision Changes; Glasses / Contacts Medical History: Positive for: Cataracts Negative for: Glaucoma; Optic Neuritis Ear/Nose/Mouth/Throat Complaints and Symptoms: Negative for: Difficult clearing ears; Sinusitis Medical History: Negative for: Chronic sinus problems/congestion; Middle ear problems Hematologic/Lymphatic Complaints and Symptoms: Negative for: Bleeding / Clotting Disorders; Human Immunodeficiency Virus Medical History: Negative for: Anemia; Hemophilia; Human Immunodeficiency Virus; Lymphedema; Sickle Cell Disease Respiratory Complaints and Symptoms: Negative for: Chronic or frequent coughs; Shortness of Breath Medical History: Negative for: Aspiration; Asthma; Chronic Obstructive Pulmonary Disease (COPD); Pneumothorax; Sleep Apnea; Tuberculosis Cardiovascular Complaints and Symptoms: Positive for: LE edema Negative for: Chest pain Medical History: Positive for: Arrhythmia - A- Fib; Hypertension Negative for: Angina; Congestive Heart Failure; Coronary Artery Disease; Deep Vein Thrombosis; Hypotension; Myocardial Infarction; Peripheral Arterial Disease; Peripheral Venous Disease; Phlebitis; Vasculitis Gastrointestinal Complaints and Symptoms: Negative for: Frequent diarrhea; Nausea; Vomiting Medical History: Negative for: Cirrhosis ; Colitis; Crohnos; Hepatitis A; Hepatitis B; Hepatitis C Endocrine  [name], [name] ([id number]) Complaints and Symptoms: Negative for: Hepatitis; Thyroid disease; Polydypsia (Excessive Thirst) Medical History: Negative for: Type I Diabetes; Type II Diabetes Genitourinary Complaints and Symptoms: Negative for: Kidney failure/ Dialysis; Incontinence/dribbling Medical History: Negative for: End Stage Renal Disease Immunological Complaints and Symptoms: Negative for: Hives; Itching Medical History: Negative for: Lupus Erythematosus; Raynaudos; Scleroderma Integumentary  (Skin) Complaints and Symptoms: Positive for: Wounds; Swelling Negative for: Bleeding or bruising tendency; Breakdown Medical History: Negative for: History of Burn; History of pressure wounds Musculoskeletal Complaints and Symptoms: Negative for: Muscle Pain; Muscle Weakness Medical History: Negative for: Gout; Rheumatoid Arthritis; Osteoarthritis; Osteomyelitis Neurologic Complaints and Symptoms: Negative for: Numbness/parasthesias; Focal/Weakness Medical History: Negative for: Dementia; Neuropathy; Quadriplegia; Paraplegia; Seizure Disorder Psychiatric Complaints and Symptoms: Negative for: Anxiety; Claustrophobia Medical History: Negative for: Anorexia/bulimia; Confinement Anxiety Oncologic Medical History: Negative for: Received Chemotherapy; Received Radiation HBO Extended History Items Eyes: Cataracts Immunizations Pneumococcal Vaccine: Received Pneumococcal Vaccination: No Implantable Devices [name], [name] ([id number]) None Family and Social History Cancer: No; Diabetes: Yes - Child; Heart Disease: No; Hypertension: Yes - Child; Kidney Disease: No; Lung Disease: No; Seizures: No; Stroke: No; Thyroid Problems: No; Tuberculosis: No; Former smoker - 64 years +; Marital Status - Married; Alcohol Use: Never; Drug Use: No History; Caffeine Use: Rarely; Financial Concerns: No; Food, Clothing or Shelter Needs: No; Support System Lacking: No; Transportation Concerns: No Electronic Signature(s) Signed: 06/12/2020 12:01:55 PM By: Georges Mouse, Minus Breeding RN Signed: 06/12/2020 5:30:36 PM By: Worthy Keeler PA-C Entered By: Georges Mouse, Minus Breeding on 06/12/2020 09:52:50 [name] ([id number]) -------------------------------------------------------------------------------- SuperBill Details Patient Name: [name] Date of Service: 06/12/2020 Medical Record Number: [id number] Patient Account Number: [id number] Date of Birth/Sex: Nov 29, 1930 (87 y.o. M) Treating RN: Carlene Coria Primary Care Provider: Gaynelle Arabian Other Clinician: Referring Provider: Gaynelle Arabian Treating Provider/Extender: Skipper Cliche in Treatment: 0 Diagnosis Coding ICD-10 Codes Code Description I87.2 Venous insufficiency (chronic) (peripheral) I89.0 Lymphedema, not elsewhere classified L97.812 Non-pressure chronic ulcer of other part of right lower leg with fat layer exposed I48.0 Paroxysmal atrial fibrillation Facility Procedures CPT4 Code: 63785885 Description: 99213 - WOUND CARE VISIT-LEV 3 EST PT Modifier: Quantity: 1 CPT4 Code: 02774128 Description: 78676 - DEB SUBQ TISSUE 20 SQ CM/< Modifier: Quantity: 1 CPT4 Code: Description: ICD-10 Diagnosis Description H20.947 Non-pressure chronic ulcer of other part of right lower leg with fat laye Modifier: r exposed Quantity: Physician Procedures CPT4 Code: 0962836 Description: WC PHYS LEVEL 3 o NEW PT Modifier: 25 Quantity: 1 CPT4 Code: Description: ICD-10 Diagnosis Description I87.2 Venous insufficiency (chronic) (peripheral) I89.0 Lymphedema, not elsewhere classified L97.812 Non-pressure chronic ulcer of other part of right lower leg with fat lay I48.0 Paroxysmal atrial fibrillation Modifier: er exposed Quantity: CPT4 Code: 6294765 Description: 46503 - WC PHYS SUBQ TISS 20 SQ CM Modifier: Quantity: 1 CPT4 Code: Description: ICD-10 Diagnosis Description T46.568 Non-pressure chronic ulcer of other part of right lower leg with fat lay Modifier: er exposed Quantity: Electronic Signature(s) Signed: 06/12/2020 5:30:19 PM By: Worthy Keeler PA-C Entered By: Worthy Keeler on 06/12/2020 17:30:19

## 2020-06-16 NOTE — Progress Notes (Signed)
[name], [name] ([id number]) Visit Report for 06/12/2020 Allergy List Details Patient Name: [name], [name]. Date of Service: 06/12/2020 9:45 AM Medical Record Number: [id number] Patient Account Number: [id number] Date of Birth/Sex: 1930/11/21 (87 y.o. M) Treating RN: Dolan Amen Primary Care Beatrice Sehgal: Gaynelle Arabian Other Clinician: Referring Bryla Burek: Gaynelle Arabian Treating Tage Feggins/Extender: Jeri Cos Weeks in Treatment: 0 Allergies Active Allergies No Known Drug Allergies Allergy Notes Electronic Signature(s) Signed: 06/12/2020 12:01:55 PM By: Georges Mouse, Minus Breeding RN Entered By: Georges Mouse, Minus Breeding on 06/12/2020 09:50:23 [name] ([id number]) -------------------------------------------------------------------------------- Arrival Information Details Patient Name: [name] Date of Service: 06/12/2020 9:45 AM Medical Record Number: [id number] Patient Account Number: [id number] Date of Birth/Sex: 12/14/1930 (87 y.o. M) Treating RN: Dolan Amen Primary Care Madisen Ludvigsen: Gaynelle Arabian Other Clinician: Referring Trenae Brunke: Gaynelle Arabian Treating Alitza Cowman/Extender: Skipper Cliche in Treatment: 0 Visit Information Patient Arrived: Charlyn Minerva Time: 09:45 Accompanied By: son Transfer Assistance: None Patient Identification Verified: Yes Secondary Verification Process Completed: Yes Patient Has Alerts: Yes Patient Alerts: Patient on Blood Thinner NOT DIABETIC ***WARFARIN*** History Since Last Visit Electronic Signature(s) Signed: 06/12/2020 12:01:55 PM By: Georges Mouse, Minus Breeding RN Entered By: Georges Mouse, Minus Breeding on 06/12/2020 10:19:24 [name] ([id number]) -------------------------------------------------------------------------------- Clinic Level of Care Assessment Details Patient Name: [name] Date of Service: 06/12/2020 9:45 AM Medical Record Number: [id number] Patient Account Number: [id number] Date of Birth/Sex: [birth year] (87 y.o.  M) Treating RN: Carlene Coria Primary Care Tremain Rucinski: Gaynelle Arabian Other Clinician: Referring Martha Soltys: Gaynelle Arabian Treating Dynastie Knoop/Extender: Skipper Cliche in Treatment: 0 Clinic Level of Care Assessment Items TOOL 1 Quantity Score X - Use when EandM and Procedure is performed on INITIAL visit 1 0 ASSESSMENTS - Nursing Assessment / Reassessment X - General Physical Exam (combine w/ comprehensive assessment (listed just below) when performed on new 1 20 pt. evals) X- 1 25 Comprehensive Assessment (HX, ROS, Risk Assessments, Wounds Hx, etc.) ASSESSMENTS - Wound and Skin Assessment / Reassessment []  - Dermatologic / Skin Assessment (not related to wound area) 0 ASSESSMENTS - Ostomy and/or Continence Assessment and Care []  - Incontinence Assessment and Management 0 []  - 0 Ostomy Care Assessment and Management (repouching, etc.) PROCESS - Coordination of Care X - Simple Patient / Family Education for ongoing care 1 15 []  - 0 Complex (extensive) Patient / Family Education for ongoing care X- 1 10 Staff obtains Programmer, systems, Records, Test Results / Process Orders []  - 0 Staff telephones HHA, Nursing Homes / Clarify orders / etc []  - 0 Routine Transfer to another Facility (non-emergent condition) []  - 0 Routine Hospital Admission (non-emergent condition) X- 1 15 New Admissions / Biomedical engineer / Ordering NPWT, Apligraf, etc. []  - 0 Emergency Hospital Admission (emergent condition) PROCESS - Special Needs []  - Pediatric / Minor Patient Management 0 []  - 0 Isolation Patient Management []  - 0 Hearing / Language / Visual special needs []  - 0 Assessment of Community assistance (transportation, D/C planning, etc.) []  - 0 Additional assistance / Altered mentation []  - 0 Support Surface(s) Assessment (bed, cushion, seat, etc.) INTERVENTIONS - Miscellaneous []  - External ear exam 0 []  - 0 Patient Transfer (multiple staff / Civil Service fast streamer / Similar devices) []  -  0 Simple Staple / Suture removal (25 or less) []  - 0 Complex Staple / Suture removal (26 or more) []  - 0 Hypo/Hyperglycemic Management (do not check if billed separately) X- 1 15 Ankle / Brachial Index (ABI) - do not check if billed separately Has the patient been seen at the  hospital within the last three years: Yes Total Score: 100 Level Of Care: New/Established - Level 3 YACOUB, DILTZ ([id number]) Electronic Signature(s) Signed: 06/16/2020 8:01:20 AM By: Carlene Coria RN Entered By: Carlene Coria on 06/12/2020 10:45:54 [name] ([id number]) -------------------------------------------------------------------------------- Encounter Discharge Information Details Patient Name: [name] Date of Service: 06/12/2020 9:45 AM Medical Record Number: [id number] Patient Account Number: [id number] Date of Birth/Sex: 1930/04/14 (87 y.o. M) Treating RN: Donnamarie Poag Primary Care Tritia Endo: Gaynelle Arabian Other Clinician: Referring Preslie Depasquale: Gaynelle Arabian Treating Illeana Edick/Extender: Skipper Cliche in Treatment: 0 Encounter Discharge Information Items Post Procedure Vitals Discharge Condition: Stable Temperature (F): 98.7 Ambulatory Status: Walker Pulse (bpm): 99 Discharge Destination: Home Respiratory Rate (breaths/min): 18 Transportation: Private Auto Blood Pressure (mmHg): 149/88 Accompanied By: son Schedule Follow-up Appointment: Yes Clinical Summary of Care: Electronic Signature(s) Signed: 06/16/2020 8:01:20 AM By: Carlene Coria RN Previous Signature: 06/12/2020 12:04:49 PM Version By: Donnamarie Poag Entered By: Carlene Coria on 06/15/2020 09:37:16 [name] ([id number]) -------------------------------------------------------------------------------- Lower Extremity Assessment Details Patient Name: [name] Date of Service: 06/12/2020 9:45 AM Medical Record Number: [id number] Patient Account Number: [id number] Date of Birth/Sex: 1930-07-06 (87 y.o. M) Treating RN:  Dolan Amen Primary Care Sabas Frett: Gaynelle Arabian Other Clinician: Referring Chistine Dematteo: Gaynelle Arabian Treating Desmon Hitchner/Extender: Skipper Cliche in Treatment: 0 Edema Assessment Assessed: Shirlyn Goltz: Yes] Patrice Paradise: Yes] Edema: [Left: Ye] [Right: s] Calf Left: Right: Point of Measurement: 34 cm From Medial Instep 36.2 cm 38 cm Ankle Left: Right: Point of Measurement: 10 cm From Medial Instep 27.7 cm 26 cm Knee To Floor Left: Right: From Medial Instep 43 cm 43 cm Vascular Assessment Pulses: Dorsalis Pedis Palpable: [Left:No] [Right:No] Doppler Audible: [Left:Yes] [Right:Yes] Posterior Tibial Palpable: [Left:No] [Right:No] Doppler Audible: [Left:Yes] [Right:Yes] Blood Pressure: Brachial: [Right:118] Dorsalis Pedis: 160 Ankle: Posterior Tibial: 162 Ankle Brachial Index: [Right:1.37] Electronic Signature(s) Signed: 06/12/2020 12:01:55 PM By: Georges Mouse, Minus Breeding RN Entered By: Georges Mouse, Minus Breeding on 06/12/2020 10:17:17 [name] ([id number]) -------------------------------------------------------------------------------- Multi Wound Chart Details Patient Name: [name] Date of Service: 06/12/2020 9:45 AM Medical Record Number: [id number] Patient Account Number: [id number] Date of Birth/Sex: Oct 25, 1930 (87 y.o. M) Treating RN: Carlene Coria Primary Care Layloni Fahrner: Gaynelle Arabian Other Clinician: Referring Nicolle Heward: Gaynelle Arabian Treating Shanyia Stines/Extender: Skipper Cliche in Treatment: 0 Vital Signs Height(in): 71 Pulse(bpm): 99 Weight(lbs): 209 Blood Pressure(mmHg): 149/88 Body Mass Index(BMI): 29 Temperature(F): 98.3 Respiratory Rate(breaths/min): 18 Photos: [N/A:N/A] Wound Location: Right, Lateral Lower Leg N/A N/A Wounding Event: Gradually Appeared N/A N/A Primary Etiology: Venous Leg Ulcer N/A N/A Comorbid History: Cataracts, Arrhythmia, N/A N/A Hypertension Date Acquired: 04/29/2020 N/A N/A Weeks of Treatment: 0 N/A N/A Wound Status: Open  N/A N/A Measurements L x W x D (cm) 3.3x2.9x0.1 N/A N/A Area (cm) : 7.516 N/A N/A Volume (cm) : 0.752 N/A N/A % Reduction in Area: 0.00% N/A N/A % Reduction in Volume: 0.00% N/A N/A Classification: Full Thickness Without Exposed N/A N/A Support Structures Exudate Amount: Medium N/A N/A Exudate Type: Serosanguineous N/A N/A Exudate Color: red, brown N/A N/A Granulation Amount: Small (1-33%) N/A N/A Granulation Quality: Red N/A N/A Necrotic Amount: Large (67-100%) N/A N/A Exposed Structures: Fat Layer (Subcutaneous Tissue): N/A N/A Yes Fascia: No Tendon: No Muscle: No Joint: No Bone: No Epithelialization: None N/A N/A Treatment Notes Electronic Signature(s) Signed: 06/16/2020 8:01:20 AM By: Carlene Coria RN Entered By: Carlene Coria on 06/12/2020 10:39:24 [name] ([id number]) -------------------------------------------------------------------------------- Multi-Disciplinary Care Plan Details Patient Name: [name] Date of Service: 06/12/2020 9:45 AM Medical  Record Number: [id number] Patient Account Number: [id number] Date of Birth/Sex: Feb 23, 1930 (87 y.o. M) Treating RN: Carlene Coria Primary Care Asya Derryberry: Gaynelle Arabian Other Clinician: Referring Aarilyn Dye: Gaynelle Arabian Treating Zamora Colton/Extender: Skipper Cliche in Treatment: 0 Active Inactive Wound/Skin Impairment Nursing Diagnoses: Knowledge deficit related to ulceration/compromised skin integrity Goals: Patient/caregiver will verbalize understanding of skin care regimen Date Initiated: 06/12/2020 Target Resolution Date: 07/12/2020 Goal Status: Active Ulcer/skin breakdown will have a volume reduction of 30% by week 4 Date Initiated: 06/12/2020 Target Resolution Date: 07/12/2020 Goal Status: Active Ulcer/skin breakdown will have a volume reduction of 50% by week 8 Date Initiated: 06/12/2020 Target Resolution Date: 08/12/2020 Goal Status: Active Ulcer/skin breakdown will have a volume reduction of 80% by week  12 Date Initiated: 06/12/2020 Target Resolution Date: 09/12/2020 Goal Status: Active Ulcer/skin breakdown will heal within 14 weeks Date Initiated: 06/12/2020 Target Resolution Date: 10/12/2020 Goal Status: Active Interventions: Assess patient/caregiver ability to obtain necessary supplies Assess patient/caregiver ability to perform ulcer/skin care regimen upon admission and as needed Assess ulceration(s) every visit Notes: Electronic Signature(s) Signed: 06/16/2020 8:01:20 AM By: Carlene Coria RN Entered By: Carlene Coria on 06/12/2020 10:38:00 [name] ([id number]) -------------------------------------------------------------------------------- Pain Assessment Details Patient Name: [name] Date of Service: 06/12/2020 9:45 AM Medical Record Number: [id number] Patient Account Number: [id number] Date of Birth/Sex: 11-Oct-1930 (87 y.o. M) Treating RN: Dolan Amen Primary Care Olinda Nola: Gaynelle Arabian Other Clinician: Referring Eland Lamantia: Gaynelle Arabian Treating Amely Voorheis/Extender: Skipper Cliche in Treatment: 0 Active Problems Location of Pain Severity and Description of Pain Patient Has Paino No Site Locations Rate the pain. Current Pain Level: 0 Pain Management and Medication Current Pain Management: Electronic Signature(s) Signed: 06/12/2020 12:01:55 PM By: Georges Mouse, Minus Breeding RN Entered By: Georges Mouse, Kenia on 06/12/2020 09:47:13 [name] ([id number]) -------------------------------------------------------------------------------- Patient/Caregiver Education Details Patient Name: [name] Date of Service: 06/12/2020 9:45 AM Medical Record Number: [id number] Patient Account Number: [id number] Date of Birth/Gender: 07-17-1930 (87 y.o. M) Treating RN: Carlene Coria Primary Care Physician: Gaynelle Arabian Other Clinician: Referring Physician: Gaynelle Arabian Treating Physician/Extender: Skipper Cliche in Treatment: 0 Education  Assessment Education Provided To: Patient Education Topics Provided Wound/Skin Impairment: Methods: Explain/Verbal Responses: State content correctly Electronic Signature(s) Signed: 06/16/2020 8:01:20 AM By: Carlene Coria RN Entered By: Carlene Coria on 06/12/2020 10:46:11 [name] ([id number]) -------------------------------------------------------------------------------- Wound Assessment Details Patient Name: [name] Date of Service: 06/12/2020 9:45 AM Medical Record Number: [id number] Patient Account Number: [id number] Date of Birth/Sex: 12-May-1930 (87 y.o. M) Treating RN: Dolan Amen Primary Care Myeesha Shane: Gaynelle Arabian Other Clinician: Referring Matas Burrows: Gaynelle Arabian Treating Natoria Archibald/Extender: Skipper Cliche in Treatment: 0 Wound Status Wound Number: 5 Primary Etiology: Venous Leg Ulcer Wound Location: Right, Lateral Lower Leg Wound Status: Open Wounding Event: Gradually Appeared Comorbid History: Cataracts, Arrhythmia, Hypertension Date Acquired: 04/29/2020 Weeks Of Treatment: 0 Clustered Wound: No Photos Wound Measurements Length: (cm) 3.3 Width: (cm) 2.9 Depth: (cm) 0.1 Area: (cm) 7.516 Volume: (cm) 0.752 % Reduction in Area: 0% % Reduction in Volume: 0% Epithelialization: None Tunneling: No Undermining: No Wound Description Classification: Full Thickness Without Exposed Support Structu Exudate Amount: Medium Exudate Type: Serosanguineous Exudate Color: red, brown res Foul Odor After Cleansing: No Slough/Fibrino Yes Wound Bed Granulation Amount: Small (1-33%) Exposed Structure Granulation Quality: Red Fascia Exposed: No Necrotic Amount: Large (67-100%) Fat Layer (Subcutaneous Tissue) Exposed: Yes Necrotic Quality: Adherent Slough Tendon Exposed: No Muscle Exposed: No Joint Exposed: No Bone Exposed: No Electronic Signature(s) Signed: 06/12/2020 10:28:08 AM By: Georges Mouse, Minus Breeding  RN Entered By: Georges Mouse, Minus Breeding on  06/12/2020 10:28:08 [name] ([id number]) -------------------------------------------------------------------------------- Vitals Details Patient Name: [name] Date of Service: 06/12/2020 9:45 AM Medical Record Number: [id number] Patient Account Number: [id number] Date of Birth/Sex: Dec 06, 1930 (87 y.o. M) Treating RN: Dolan Amen Primary Care Delores Edelstein: Gaynelle Arabian Other Clinician: Referring Jorie Zee: Gaynelle Arabian Treating Colman Birdwell/Extender: Skipper Cliche in Treatment: 0 Vital Signs Time Taken: 09:47 Temperature (F): 98.3 Height (in): 71 Pulse (bpm): 99 Source: Stated Respiratory Rate (breaths/min): 18 Weight (lbs): 209 Blood Pressure (mmHg): 149/88 Source: Measured Reference Range: 80 - 120 mg / dl Body Mass Index (BMI): 29.1 Electronic Signature(s) Signed: 06/12/2020 12:01:55 PM By: Georges Mouse, Minus Breeding RN Entered By: Georges Mouse, Minus Breeding on 06/12/2020 09:50:05

## 2020-06-16 NOTE — Progress Notes (Signed)
IVAAN, LIDDY ([id number]) Visit Report for 06/15/2020 Arrival Information Details Patient Name: [name], [name]. Date of Service: 06/15/2020 9:15 AM Medical Record Number: [id number] Patient Account Number: [id number] Date of Birth/Sex: 05-Jan-1931 (87 y.o. M) Treating RN: Carlene Coria Primary Care Giovan Pinsky: Gaynelle Arabian Other Clinician: Referring Krysteena Stalker: Gaynelle Arabian Treating Neda Willenbring/Extender: Skipper Cliche in Treatment: 0 Visit Information History Since Last Visit All ordered tests and consults were completed: No Patient Arrived: Gilford Rile Added or deleted any medications: No Arrival Time: 09:37 Any new allergies or adverse reactions: No Accompanied By: son Had a fall or experienced change in No Transfer Assistance: None activities of daily living that may affect Patient Identification Verified: Yes risk of falls: Secondary Verification Process Completed: Yes Signs or symptoms of abuse/neglect since last visito No Patient Requires Transmission-Based No Hospitalized since last visit: No Precautions: Implantable device outside of the clinic excluding No Patient Has Alerts: Yes cellular tissue based products placed in the center Patient Alerts: Patient on Blood since last visit: Thinner Has Dressing in Place as Prescribed: Yes NOT DIABETIC Has Compression in Place as Prescribed: Yes ***WARFARIN*** Pain Present Now: No Electronic Signature(s) Signed: 06/16/2020 8:01:20 AM By: Carlene Coria RN Entered By: Carlene Coria on 06/15/2020 09:38:09 [name] ([id number]) -------------------------------------------------------------------------------- Clinic Level of Care Assessment Details Patient Name: [name] Date of Service: 06/15/2020 9:15 AM Medical Record Number: [id number] Patient Account Number: [id number] Date of Birth/Sex: [birth year] (87 y.o. M) Treating RN: Carlene Coria Primary Care Brentley Landfair: Gaynelle Arabian Other Clinician: Referring Lilias Lorensen: Gaynelle Arabian Treating Kataleena Holsapple/Extender: Skipper Cliche in Treatment: 0 Clinic Level of Care Assessment Items TOOL 1 Quantity Score []  - Use when EandM and Procedure is performed on INITIAL visit 0 ASSESSMENTS - Nursing Assessment / Reassessment []  - General Physical Exam (combine w/ comprehensive assessment (listed just below) when performed on new 0 pt. evals) []  - 0 Comprehensive Assessment (HX, ROS, Risk Assessments, Wounds Hx, etc.) ASSESSMENTS - Wound and Skin Assessment / Reassessment []  - Dermatologic / Skin Assessment (not related to wound area) 0 ASSESSMENTS - Ostomy and/or Continence Assessment and Care []  - Incontinence Assessment and Management 0 []  - 0 Ostomy Care Assessment and Management (repouching, etc.) PROCESS - Coordination of Care []  - Simple Patient / Family Education for ongoing care 0 []  - 0 Complex (extensive) Patient / Family Education for ongoing care []  - 0 Staff obtains Programmer, systems, Records, Test Results / Process Orders []  - 0 Staff telephones HHA, Nursing Homes / Clarify orders / etc []  - 0 Routine Transfer to another Facility (non-emergent condition) []  - 0 Routine Hospital Admission (non-emergent condition) []  - 0 New Admissions / Biomedical engineer / Ordering NPWT, Apligraf, etc. []  - 0 Emergency Hospital Admission (emergent condition) PROCESS - Special Needs []  - Pediatric / Minor Patient Management 0 []  - 0 Isolation Patient Management []  - 0 Hearing / Language / Visual special needs []  - 0 Assessment of Community assistance (transportation, D/C planning, etc.) []  - 0 Additional assistance / Altered mentation []  - 0 Support Surface(s) Assessment (bed, cushion, seat, etc.) INTERVENTIONS - Miscellaneous []  - External ear exam 0 []  - 0 Patient Transfer (multiple staff / Civil Service fast streamer / Similar devices) []  - 0 Simple Staple / Suture removal (25 or less) []  - 0 Complex Staple / Suture removal (26 or more) []  - 0 Hypo/Hyperglycemic  Management (do not check if billed separately) []  - 0 Ankle / Brachial Index (ABI) - do not check if billed separately Has the  patient been seen at the hospital within the last three years: Yes Total Score: 0 Level Of Care: ____ [name] ([id number]) Electronic Signature(s) Signed: 06/16/2020 8:01:20 AM By: Carlene Coria RN Entered By: Carlene Coria on 06/15/2020 09:40:42 [name] ([id number]) -------------------------------------------------------------------------------- Compression Therapy Details Patient Name: [name] Date of Service: 06/15/2020 9:15 AM Medical Record Number: [id number] Patient Account Number: [id number] Date of Birth/Sex: May 14, 1930 (87 y.o. M) Treating RN: Carlene Coria Primary Care Jillisa Harris: Gaynelle Arabian Other Clinician: Referring Hassie Mandt: Gaynelle Arabian Treating Ladana Chavero/Extender: Skipper Cliche in Treatment: 0 Compression Therapy Performed for Wound Assessment: Wound #5 Right,Lateral Lower Leg Performed By: Clinician Carlene Coria, RN Compression Type: Three Layer Electronic Signature(s) Signed: 06/16/2020 8:01:20 AM By: Carlene Coria RN Entered By: Carlene Coria on 06/15/2020 09:38:51 [name] ([id number]) -------------------------------------------------------------------------------- Encounter Discharge Information Details Patient Name: [name] Date of Service: 06/15/2020 9:15 AM Medical Record Number: [id number] Patient Account Number: [id number] Date of Birth/Sex: 1930-09-21 (87 y.o. M) Treating RN: Carlene Coria Primary Care Weston Fulco: Gaynelle Arabian Other Clinician: Referring Solon Alban: Gaynelle Arabian Treating Orley Lawry/Extender: Skipper Cliche in Treatment: 0 Encounter Discharge Information Items Discharge Condition: Stable Ambulatory Status: Walker Discharge Destination: Home Transportation: Private Auto Accompanied By: son Schedule Follow-up Appointment: Yes Clinical Summary of Care: Patient  Declined Electronic Signature(s) Signed: 06/16/2020 8:01:20 AM By: Carlene Coria RN Entered By: Carlene Coria on 06/15/2020 09:40:35 [name] ([id number]) -------------------------------------------------------------------------------- Wound Assessment Details Patient Name: [name] Date of Service: 06/15/2020 9:15 AM Medical Record Number: [id number] Patient Account Number: [id number] Date of Birth/Sex: [birth year] (87 y.o. M) Treating RN: Carlene Coria Primary Care Keith Cancio: Gaynelle Arabian Other Clinician: Referring Chukwuma Straus: Gaynelle Arabian Treating Cintya Daughety/Extender: Skipper Cliche in Treatment: 0 Wound Status Wound Number: 5 Primary Etiology: Venous Leg Ulcer Wound Location: Right, Lateral Lower Leg Wound Status: Open Wounding Event: Gradually Appeared Comorbid History: Cataracts, Arrhythmia, Hypertension Date Acquired: 04/29/2020 Weeks Of Treatment: 0 Clustered Wound: No Wound Measurements Length: (cm) 3.3 Width: (cm) 2.9 Depth: (cm) 0.1 Area: (cm) 7.516 Volume: (cm) 0.752 % Reduction in Area: 0% % Reduction in Volume: 0% Epithelialization: None Tunneling: No Undermining: No Wound Description Classification: Full Thickness Without Exposed Support Structu Exudate Amount: Medium Exudate Type: Serosanguineous Exudate Color: red, brown res Foul Odor After Cleansing: No Slough/Fibrino Yes Wound Bed Granulation Amount: Small (1-33%) Exposed Structure Granulation Quality: Red Fascia Exposed: No Necrotic Amount: Large (67-100%) Fat Layer (Subcutaneous Tissue) Exposed: Yes Necrotic Quality: Adherent Slough Tendon Exposed: No Muscle Exposed: No Joint Exposed: No Bone Exposed: No Treatment Notes Wound #5 (Lower Leg) Wound Laterality: Right, Lateral Cleanser Soap and Water Discharge Instruction: Gently cleanse wound with antibacterial soap, rinse and pat dry prior to dressing wounds Peri-Wound Care Topical Primary Dressing Prisma 4.34 (in) Discharge  Instruction: Moisten w/normal saline or sterile water; Cover wound as directed. Do not remove from wound bed. Secondary Dressing Xtrasorb Medium 4x5 (in/in) Discharge Instruction: Apply to wound as directed. Do not cut. Secured With Compression Wrap Profore Lite LF 3 Multilayer Compression Bandaging System Discharge Instruction: Apply 3 multi-layer wrap as prescribed. JAWAD, WIACEK ([id number]) Compression Stockings Add-Ons Electronic Signature(s) Signed: 06/16/2020 8:01:20 AM By: Carlene Coria RN Entered By: Carlene Coria on 06/15/2020 09:38:33

## 2020-06-19 ENCOUNTER — Other Ambulatory Visit: Payer: Self-pay

## 2020-06-19 ENCOUNTER — Encounter: Payer: Medicare Other | Admitting: Physician Assistant

## 2020-06-19 DIAGNOSIS — L97812 Non-pressure chronic ulcer of other part of right lower leg with fat layer exposed: Secondary | ICD-10-CM | POA: Diagnosis not present

## 2020-06-19 DIAGNOSIS — I872 Venous insufficiency (chronic) (peripheral): Secondary | ICD-10-CM | POA: Diagnosis not present

## 2020-06-19 DIAGNOSIS — I890 Lymphedema, not elsewhere classified: Secondary | ICD-10-CM | POA: Diagnosis not present

## 2020-06-19 DIAGNOSIS — S51011A Laceration without foreign body of right elbow, initial encounter: Secondary | ICD-10-CM | POA: Diagnosis not present

## 2020-06-19 DIAGNOSIS — Z7901 Long term (current) use of anticoagulants: Secondary | ICD-10-CM | POA: Diagnosis not present

## 2020-06-19 NOTE — Progress Notes (Signed)
DOMINYK, LAW ([id number]) Visit Report for 06/19/2020 Arrival Information Details Patient Name: [name], [name]. Date of Service: 06/19/2020 8:30 AM Medical Record Number: [id number] Patient Account Number: [id number] Date of Birth/Sex: 09-27-1930 (87 y.o. M) Treating RN: Carlene Coria Primary Care Keno Caraway: Gaynelle Arabian Other Clinician: Referring Ashley Montminy: Gaynelle Arabian Treating Tanner Vigna/Extender: Skipper Cliche in Treatment: 1 Visit Information History Since Last Visit Added or deleted any medications: No Patient Arrived: [name] Had a fall or experienced change in No Arrival Time: 08:40 activities of daily living that may affect Accompanied By: son risk of falls: Transfer Assistance: None Hospitalized since last visit: No Patient Identification Verified: Yes Pain Present Now: No Secondary Verification Process Completed: Yes Patient Requires Transmission-Based No Precautions: Patient Has Alerts: Yes Patient Alerts: Patient on Blood Thinner NOT DIABETIC ***WARFARIN*** Electronic Signature(s) Signed: 06/19/2020 4:27:18 PM By: Jeanine Luz Entered By: Jeanine Luz on 06/19/2020 08:41:20 [name] ([id number]) -------------------------------------------------------------------------------- Clinic Level of Care Assessment Details Patient Name: [name] Date of Service: 06/19/2020 8:30 AM Medical Record Number: [id number] Patient Account Number: [id number] Date of Birth/Sex: 1930-05-15 (87 y.o. M) Treating RN: Carlene Coria Primary Care Glendola Friedhoff: Gaynelle Arabian Other Clinician: Referring Jolonda Gomm: Gaynelle Arabian Treating Leenah Seidner/Extender: Skipper Cliche in Treatment: 1 Clinic Level of Care Assessment Items TOOL 1 Quantity Score []  - Use when EandM and Procedure is performed on INITIAL visit 0 ASSESSMENTS - Nursing Assessment / Reassessment []  - General Physical Exam (combine w/ comprehensive assessment (listed just below) when performed on  new 0 pt. evals) []  - 0 Comprehensive Assessment (HX, ROS, Risk Assessments, Wounds Hx, etc.) ASSESSMENTS - Wound and Skin Assessment / Reassessment []  - Dermatologic / Skin Assessment (not related to wound area) 0 ASSESSMENTS - Ostomy and/or Continence Assessment and Care []  - Incontinence Assessment and Management 0 []  - 0 Ostomy Care Assessment and Management (repouching, etc.) PROCESS - Coordination of Care []  - Simple Patient / Family Education for ongoing care 0 []  - 0 Complex (extensive) Patient / Family Education for ongoing care []  - 0 Staff obtains Programmer, systems, Records, Test Results / Process Orders []  - 0 Staff telephones HHA, Nursing Homes / Clarify orders / etc []  - 0 Routine Transfer to another Facility (non-emergent condition) []  - 0 Routine Hospital Admission (non-emergent condition) []  - 0 New Admissions / Biomedical engineer / Ordering NPWT, Apligraf, etc. []  - 0 Emergency Hospital Admission (emergent condition) PROCESS - Special Needs []  - Pediatric / Minor Patient Management 0 []  - 0 Isolation Patient Management []  - 0 Hearing / Language / Visual special needs []  - 0 Assessment of Community assistance (transportation, D/C planning, etc.) []  - 0 Additional assistance / Altered mentation []  - 0 Support Surface(s) Assessment (bed, cushion, seat, etc.) INTERVENTIONS - Miscellaneous []  - External ear exam 0 []  - 0 Patient Transfer (multiple staff / Civil Service fast streamer / Similar devices) []  - 0 Simple Staple / Suture removal (25 or less) []  - 0 Complex Staple / Suture removal (26 or more) []  - 0 Hypo/Hyperglycemic Management (do not check if billed separately) []  - 0 Ankle / Brachial Index (ABI) - do not check if billed separately Has the patient been seen at the hospital within the last three years: Yes Total Score: 0 Level Of Care: ____ [name] ([id number]) Electronic Signature(s) Signed: 06/19/2020 9:23:44 AM By: Carlene Coria RN Entered By:  Carlene Coria on 06/19/2020 09:12:58 [name] ([id number]) -------------------------------------------------------------------------------- Encounter Discharge Information Details Patient Name: [name] Date of Service: 06/19/2020  8:30 AM Medical Record Number: [id number] Patient Account Number: [id number] Date of Birth/Sex: Nov 02, 1930 (87 y.o. M) Treating RN: Donnamarie Poag Primary Care Charday Capetillo: Gaynelle Arabian Other Clinician: Referring Agustus Mane: Gaynelle Arabian Treating Jasmia Angst/Extender: Skipper Cliche in Treatment: 1 Encounter Discharge Information Items Post Procedure Vitals Discharge Condition: Stable Temperature (F): 98.3 Ambulatory Status: Walker Pulse (bpm): 77 Discharge Destination: Home Respiratory Rate (breaths/min): 20 Transportation: Private Auto Blood Pressure (mmHg): 123/73 Accompanied By: son Schedule Follow-up Appointment: Yes Clinical Summary of Care: Electronic Signature(s) Signed: 06/19/2020 1:59:38 PM By: Donnamarie Poag Entered By: Donnamarie Poag on 06/19/2020 09:26:25 [name] ([id number]) -------------------------------------------------------------------------------- Lower Extremity Assessment Details Patient Name: [name] Date of Service: 06/19/2020 8:30 AM Medical Record Number: [id number] Patient Account Number: [id number] Date of Birth/Sex: [birth year] (87 y.o. M) Treating RN: Carlene Coria Primary Care Dyamond Tolosa: Gaynelle Arabian Other Clinician: Referring Dashauna Heymann: Gaynelle Arabian Treating Kimberely Mccannon/Extender: Skipper Cliche in Treatment: 1 Edema Assessment Assessed: [Left: No] [Right: Yes] Edema: [Left: Ye] [Right: s] Calf Left: Right: Point of Measurement: 34 cm From Medial Instep 36.2 cm 35.6 cm Ankle Left: Right: Point of Measurement: 10 cm From Medial Instep 27.7 cm 25.5 cm Vascular Assessment Pulses: Dorsalis Pedis Palpable: [Right:Yes] Electronic Signature(s) Signed: 06/19/2020 9:23:44 AM By: Carlene Coria  RN Signed: 06/19/2020 4:27:18 PM By: Jeanine Luz Entered By: Jeanine Luz on 06/19/2020 08:57:24 [name] ([id number]) -------------------------------------------------------------------------------- Multi Wound Chart Details Patient Name: [name] Date of Service: 06/19/2020 8:30 AM Medical Record Number: [id number] Patient Account Number: [id number] Date of Birth/Sex: August 01, 1930 (87 y.o. M) Treating RN: Carlene Coria Primary Care Ericia Moxley: Gaynelle Arabian Other Clinician: Referring Atiya Yera: Gaynelle Arabian Treating Keoni Risinger/Extender: Skipper Cliche in Treatment: 1 Vital Signs Height(in): 71 Pulse(bpm): 65 Weight(lbs): 209 Blood Pressure(mmHg): 123/73 Body Mass Index(BMI): 29 Temperature(F): 98.3 Respiratory Rate(breaths/min): 20 Photos: [N/A:N/A] Wound Location: Right, Lateral Lower Leg N/A N/A Wounding Event: Gradually Appeared N/A N/A Primary Etiology: Venous Leg Ulcer N/A N/A Comorbid History: Cataracts, Arrhythmia, N/A N/A Hypertension Date Acquired: 04/29/2020 N/A N/A Weeks of Treatment: 1 N/A N/A Wound Status: Open N/A N/A Measurements L x W x D (cm) 3.1x2.1x0.1 N/A N/A Area (cm) : 5.113 N/A N/A Volume (cm) : 0.511 N/A N/A % Reduction in Area: 32.00% N/A N/A % Reduction in Volume: 32.00% N/A N/A Classification: Full Thickness Without Exposed N/A N/A Support Structures Exudate Amount: Medium N/A N/A Exudate Type: Serosanguineous N/A N/A Exudate Color: red, brown N/A N/A Granulation Amount: Medium (34-66%) N/A N/A Granulation Quality: Red, Pink N/A N/A Necrotic Amount: Medium (34-66%) N/A N/A Exposed Structures: Fat Layer (Subcutaneous Tissue): N/A N/A Yes Fascia: No Tendon: No Muscle: No Joint: No Bone: No Epithelialization: None N/A N/A Treatment Notes Electronic Signature(s) Signed: 06/19/2020 9:23:44 AM By: Carlene Coria RN Entered By: Carlene Coria on 06/19/2020 09:07:05 [name]  ([id number]) -------------------------------------------------------------------------------- [place] Details Patient Name: [name] Date of Service: 06/19/2020 8:30 AM Medical Record Number: [id number] Patient Account Number: [id number] Date of Birth/Sex: 1930/04/18 (87 y.o. M) Treating RN: Carlene Coria Primary Care Fabiano Ginley: Gaynelle Arabian Other Clinician: Referring Dove Gresham: Gaynelle Arabian Treating Osbaldo Mark/Extender: Skipper Cliche in Treatment: 1 Active Inactive Wound/Skin Impairment Nursing Diagnoses: Knowledge deficit related to ulceration/compromised skin integrity Goals: Patient/caregiver will verbalize understanding of skin care regimen Date Initiated: 06/12/2020 Target Resolution Date: 07/12/2020 Goal Status: Active Ulcer/skin breakdown will have a volume reduction of 30% by week 4 Date Initiated: 06/12/2020 Target Resolution Date: 07/12/2020 Goal Status: Active Ulcer/skin breakdown will have a volume reduction of 50% by  week 8 Date Initiated: 06/12/2020 Target Resolution Date: 08/12/2020 Goal Status: Active Ulcer/skin breakdown will have a volume reduction of 80% by week 12 Date Initiated: 06/12/2020 Target Resolution Date: 09/12/2020 Goal Status: Active Ulcer/skin breakdown will heal within 14 weeks Date Initiated: 06/12/2020 Target Resolution Date: 10/12/2020 Goal Status: Active Interventions: Assess patient/caregiver ability to obtain necessary supplies Assess patient/caregiver ability to perform ulcer/skin care regimen upon admission and as needed Assess ulceration(s) every visit Notes: Electronic Signature(s) Signed: 06/19/2020 9:23:44 AM By: Carlene Coria RN Entered By: Carlene Coria on 06/19/2020 09:06:56 [name] ([id number]) -------------------------------------------------------------------------------- Pain Assessment Details Patient Name: [name] Date of Service: 06/19/2020 8:30 AM Medical Record Number: [id number] Patient  Account Number: [id number] Date of Birth/Sex: 1930-07-07 (87 y.o. M) Treating RN: Carlene Coria Primary Care Mayela Bullard: Gaynelle Arabian Other Clinician: Referring Normagene Harvie: Gaynelle Arabian Treating Garl Speigner/Extender: Skipper Cliche in Treatment: 1 Active Problems Location of Pain Severity and Description of Pain Patient Has Paino No Site Locations Rate the pain. Current Pain Level: 0 Pain Management and Medication Current Pain Management: Electronic Signature(s) Signed: 06/19/2020 9:23:44 AM By: Carlene Coria RN Signed: 06/19/2020 4:27:18 PM By: Jeanine Luz Entered By: Jeanine Luz on 06/19/2020 08:43:00 [name] ([id number]) -------------------------------------------------------------------------------- Patient/Caregiver Education Details Patient Name: [name] Date of Service: 06/19/2020 8:30 AM Medical Record Number: [id number] Patient Account Number: [id number] Date of Birth/Gender: 1930/11/17 (87 y.o. M) Treating RN: Carlene Coria Primary Care Physician: Gaynelle Arabian Other Clinician: Referring Physician: Gaynelle Arabian Treating Physician/Extender: Skipper Cliche in Treatment: 1 Education Assessment Education Provided To: Patient Education Topics Provided Wound/Skin Impairment: Methods: Explain/Verbal Responses: State content correctly Electronic Signature(s) Signed: 06/19/2020 9:23:44 AM By: Carlene Coria RN Entered By: Carlene Coria on 06/19/2020 09:13:20 [name] ([id number]) -------------------------------------------------------------------------------- Wound Assessment Details Patient Name: [name] Date of Service: 06/19/2020 8:30 AM Medical Record Number: [id number] Patient Account Number: [id number] Date of Birth/Sex: [birth year] (87 y.o. M) Treating RN: Carlene Coria Primary Care Christiann Hagerty: Gaynelle Arabian Other Clinician: Referring Maryna Yeagle: Gaynelle Arabian Treating Ben Sanz/Extender: Skipper Cliche in Treatment: 1 Wound  Status Wound Number: 5 Primary Etiology: Venous Leg Ulcer Wound Location: Right, Lateral Lower Leg Wound Status: Open Wounding Event: Gradually Appeared Comorbid History: Cataracts, Arrhythmia, Hypertension Date Acquired: 04/29/2020 Weeks Of Treatment: 1 Clustered Wound: No Photos Wound Measurements Length: (cm) 3.1 Width: (cm) 2.1 Depth: (cm) 0.1 Area: (cm) 5.113 Volume: (cm) 0.511 % Reduction in Area: 32% % Reduction in Volume: 32% Epithelialization: None Tunneling: No Undermining: No Wound Description Classification: Full Thickness Without Exposed Support Structures Exudate Amount: Medium Exudate Type: Serosanguineous Exudate Color: red, brown Foul Odor After Cleansing: No Slough/Fibrino Yes Wound Bed Granulation Amount: Medium (34-66%) Exposed Structure Granulation Quality: Red, Pink Fascia Exposed: No Necrotic Amount: Medium (34-66%) Fat Layer (Subcutaneous Tissue) Exposed: Yes Necrotic Quality: Adherent Slough Tendon Exposed: No Muscle Exposed: No Joint Exposed: No Bone Exposed: No Treatment Notes Wound #5 (Lower Leg) Wound Laterality: Right, Lateral Cleanser Soap and Water Discharge Instruction: Gently cleanse wound with antibacterial soap, rinse and pat dry prior to dressing wounds Peri-Wound Care KHALLID, PASILLAS ([id number]) Topical Primary Dressing Prisma 4.34 (in) Discharge Instruction: Moisten w/normal saline or sterile water; Cover wound as directed. Do not remove from wound bed. Secondary Dressing Xtrasorb Medium 4x5 (in/in) Discharge Instruction: Apply to wound as directed. Do not cut. Secured With Compression Wrap Profore Lite LF 3 Multilayer Compression Bandaging System Discharge Instruction: Apply 3 multi-layer wrap as prescribed. Compression Stockings Add-Ons Electronic Signature(s) Signed: 06/19/2020 9:23:44 AM By:  Carlene Coria RN Signed: 06/19/2020 4:27:18 PM By: Jeanine Luz Entered By: Jeanine Luz on 06/19/2020  08:55:02 [name] ([id number]) -------------------------------------------------------------------------------- Vitals Details Patient Name: [name] Date of Service: 06/19/2020 8:30 AM Medical Record Number: [id number] Patient Account Number: [id number] Date of Birth/Sex: 1930-12-03 (87 y.o. M) Treating RN: Carlene Coria Primary Care Clarice Bonaventure: Gaynelle Arabian Other Clinician: Referring Carlen Rebuck: Gaynelle Arabian Treating Luana Tatro/Extender: Skipper Cliche in Treatment: 1 Vital Signs Time Taken: 08:45 Temperature (F): 98.3 Height (in): 71 Pulse (bpm): 77 Weight (lbs): 209 Respiratory Rate (breaths/min): 20 Body Mass Index (BMI): 29.1 Blood Pressure (mmHg): 123/73 Reference Range: 80 - 120 mg / dl Electronic Signature(s) Signed: 06/19/2020 4:27:18 PM By: Jeanine Luz Entered By: Jeanine Luz on 06/19/2020 08:53:47

## 2020-06-19 NOTE — Progress Notes (Addendum)
[name], [name] ([id number]) Visit Report for 06/19/2020 Chief Complaint Document Details Patient Name: [name], [name]. Date of Service: 06/19/2020 8:30 AM Medical Record Number: [id number] Patient Account Number: [id number] Date of Birth/Sex: [birth year] (87 y.o. M) Treating RN: Carlene Coria Primary Care Provider: Gaynelle Arabian Other Clinician: Referring Provider: Gaynelle Arabian Treating Provider/Extender: Skipper Cliche in Treatment: 1 Information Obtained from: Patient Chief Complaint Right LE Ulcers Electronic Signature(s) Signed: 06/19/2020 9:04:10 AM By: Worthy Keeler PA-C Entered By: Worthy Keeler on 06/19/2020 09:04:10 [name] ([id number]) -------------------------------------------------------------------------------- Debridement Details Patient Name: [name] Date of Service: 06/19/2020 8:30 AM Medical Record Number: [id number] Patient Account Number: [id number] Date of Birth/Sex: 1930-05-03 (87 y.o. M) Treating RN: Carlene Coria Primary Care Provider: Gaynelle Arabian Other Clinician: Referring Provider: Gaynelle Arabian Treating Provider/Extender: Skipper Cliche in Treatment: 1 Debridement Performed for Wound #5 Right,Lateral Lower Leg Assessment: Performed By: Physician Tommie Sams., PA-C Debridement Type: Debridement Severity of Tissue Pre Debridement: Fat layer exposed Level of Consciousness (Pre- Awake and Alert procedure): Pre-procedure Verification/Time Out Yes - 09:04 Taken: Start Time: 09:04 Pain Control: Lidocaine 4% Topical Solution Total Area Debrided (L x W): 3.1 (cm) x 2.1 (cm) = 6.51 (cm) Tissue and other material Viable, Non-Viable, Slough, Subcutaneous, Slough debrided: Level: Skin/Subcutaneous Tissue Debridement Description: Excisional Instrument: Curette Bleeding: Moderate Hemostasis Achieved: Pressure End Time: 09:08 Procedural Pain: 0 Post Procedural Pain: 0 Response to Treatment: Procedure was tolerated  well Level of Consciousness (Post- Awake and Alert procedure): Post Debridement Measurements of Total Wound Length: (cm) 3.1 Width: (cm) 2.1 Depth: (cm) 0.1 Volume: (cm) 0.511 Character of Wound/Ulcer Post Debridement: Improved Severity of Tissue Post Debridement: Fat layer exposed Post Procedure Diagnosis Same as Pre-procedure Electronic Signature(s) Signed: 06/19/2020 1:45:04 PM By: Worthy Keeler PA-C Signed: 06/19/2020 4:40:55 PM By: Carlene Coria RN Previous Signature: 06/19/2020 9:23:44 AM Version By: Carlene Coria RN Entered By: Worthy Keeler on 06/19/2020 13:45:04 [name] ([id number]) -------------------------------------------------------------------------------- HPI Details Patient Name: [name] Date of Service: 06/19/2020 8:30 AM Medical Record Number: [id number] Patient Account Number: [id number] Date of Birth/Sex: 1930-09-28 (87 y.o. M) Treating RN: Carlene Coria Primary Care Provider: Gaynelle Arabian Other Clinician: Referring Provider: Gaynelle Arabian Treating Provider/Extender: Skipper Cliche in Treatment: 1 History of Present Illness HPI Description: 87 year old gentleman has been referred to [place] by his PCP Dr. Gaynelle Arabian, for a ulcer on the left posterior lower leg which has been there for about 6 weeks. The patient was thought to have an infection and was put on doxycycline for 14 days and referred to the wound center. He has a past medical history of paroxysmal atrial fibrillation, hypertension, diverticulosis, status post right hip replacement, appendectomy. He has not been a smoker since 34. the anterior shin wound has been more recent about 2 weeks and this was caused probably due to a blunt abrasion. 03/06/17 on evaluation today patient's ulcer appears to show signs of excellent improvement. He is definitely showing additional evidence of granulation which is great news. Overall I'm extremely pleased with the progress that he has been  making from the beginning to where we are right now. With that being said I would like to potentially try different dressing to see if we could stimulate this to heal even faster. 03/13/17 on evaluation today patient appears to be doing little better in regard to his leg ulcer at this time. He has been tolerating the dressing changes without complication. There does not appear to be  any evidence of infection which is good news. He does have some additional granulation observed at this point and I do believe that the silver collagen dressing has been of benefit in this regard. Overall I'm very pleased with the progress she's made since last visit the only downside is that he does have additional maceration due to drainage compared to previous we may need to do something to address this. 03/27/17 on evaluation today patient appears to be doing well in regard to his left posterior lower surety ulcer. He has a significant area of epithelial growth which seems to be doing excellent at this point in time. He also seems to be well controlled as far as slough is concerned and drainage there is no evidence of maceration and there really was no slough that could not be cleaned off with saline and gauze. Overall patient seems to be making excellent progress. 04/10/17 on evaluation today patient appears to be doing better in regard to the original ulcer for which we have been treating him. Unfortunately he has two areas where he sustained injury to his posterior leg more proximal to where the current ulceration is. This was due to it appears to be tape and adhesive at this point. When he was using the Allevyn dressing there was no problems however they ran out of these and since that point there seems to have been more irritation around the wound from the Band-Aids that his wife has been using in their place. Other than this which appear to be very superficial new ulcers everything appears to be progressing  nicely. 04/24/17 on evaluation today patient appears to be doing excellent in regard to his left posterior lower extremity ulcer. He is been tolerating the dressing changes without complication fortunately there does not appear to be any evidence of infection at this point in time. Overall he has made significant improvements since I last saw him. 05/08/17 on evaluation today patient's wound appears to be completely healed at this point. Fortunately this is great news and though it is closed over with epithelium there still some healing to be done underneath for this completely toughen up this was explained to the patient and his wife today as well. Readmission: 06/12/2020 this is a gentleman whom I have previously seen here in the clinic before in regards to similar issues with wounds on his lower extremities. 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 He is also very swollen the skin is tense and even shiny due to the fluid buildup. Obviously I think this is definitely something that we need to address sooner rather than later as far as that is concerned. Fortunately there does not appear to be any signs of active infection he is on a blood thinner, Coumadin, this is due to chronic atrial fibrillation. 06/19/2020 upon evaluation today patient's wound is actually showing signs of significant improvement. I am actually extremely pleased with where things stand he is going require little bit of debridement to clear away some of the necrotic debris on the surface of the wound he actually is okay with that he is on Coumadin selective do this carefully but nonetheless I think it is definitely achievable. Electronic Signature(s) Signed: 06/19/2020 1:43:32 PM By: Worthy Keeler PA-C Entered By: Worthy Keeler on 06/19/2020 13:43:32 [name]  ([id number]) -------------------------------------------------------------------------------- Physical Exam Details Patient Name:  [name], Danny J. Date of Service: 06/19/2020 8:30 AM Medical Record Number: [id number] Patient Account Number: [id number] Date of Birth/Sex: 08/07/1930 (87 y.o. M) Treating RN: Carlene Coria Primary Care Provider: Gaynelle Arabian Other Clinician: Referring Provider: Gaynelle Arabian Treating Provider/Extender: Skipper Cliche in Treatment: 1 Constitutional Well-nourished and well-hydrated in no acute distress. Respiratory normal breathing without difficulty. Psychiatric this patient is able to make decisions and demonstrates good insight into disease process. Alert and Oriented x 3. pleasant and cooperative. Notes Patient's wound bed showed signs of good granulation epithelization at this point. There does not appear to be any signs of infection which is great overall very pleased with where things stand today. No fevers, chills, nausea, vomiting, or diarrhea. I did perform sharp debridement clearly some of the necrotic debris he tolerated that today without complication postdebridement the wound bed appears to be doing much better. Electronic Signature(s) Signed: 06/19/2020 1:43:52 PM By: Worthy Keeler PA-C Entered By: Worthy Keeler on 06/19/2020 13:43:51 [name] ([id number]) -------------------------------------------------------------------------------- Physician Orders Details Patient Name: [name] Date of Service: 06/19/2020 8:30 AM Medical Record Number: [id number] Patient Account Number: [id number] Date of Birth/Sex: Sep 16, 1930 (87 y.o. M) Treating RN: Carlene Coria Primary Care Provider: Gaynelle Arabian Other Clinician: Referring Provider: Gaynelle Arabian Treating Provider/Extender: Skipper Cliche in Treatment: 1 Verbal / Phone Orders: No Diagnosis Coding ICD-10 Coding Code Description I87.2 Venous insufficiency (chronic)  (peripheral) I89.0 Lymphedema, not elsewhere classified L97.812 Non-pressure chronic ulcer of other part of right lower leg with fat layer exposed I48.0 Paroxysmal atrial fibrillation Follow-up Appointments o Return Appointment in 1 week. Bathing/ Shower/ Hygiene o May shower with wound dressing protected with water repellent cover or cast protector. Edema Control - Lymphedema / Segmental Compressive Device / Other o Optional: One layer of unna paste to top of compression wrap (to act as an anchor). o Elevate, Exercise Daily and Avoid Standing for Long Periods of Time. o Elevate legs to the level of the heart and pump ankles as often as possible o Elevate leg(s) parallel to the floor when sitting. Wound Treatment Wound #5 - Lower Leg Wound Laterality: Right, Lateral Cleanser: Soap and Water 2 x Per Week Discharge Instructions: Gently cleanse wound with antibacterial soap, rinse and pat dry prior to dressing wounds Primary Dressing: Prisma 4.34 (in) 2 x Per Week Discharge Instructions: Moisten w/normal saline or sterile water; Cover wound as directed. Do not remove from wound bed. Secondary Dressing: Xtrasorb Medium 4x5 (in/in) 2 x Per Week Discharge Instructions: Apply to wound as directed. Do not cut. Compression Wrap: Profore Lite LF 3 Multilayer Compression Bandaging System 2 x Per Week Discharge Instructions: Apply 3 multi-layer wrap as prescribed. Electronic Signature(s) Signed: 06/19/2020 9:23:44 AM By: Carlene Coria RN Signed: 06/19/2020 5:52:53 PM By: Worthy Keeler PA-C Entered By: Carlene Coria on 06/19/2020 09:12:42 [name] ([id number]) -------------------------------------------------------------------------------- Problem List Details Patient Name: [name], GIL. Date of Service: 06/19/2020 8:30 AM Medical Record Number: [id number] Patient Account Number: [id number] Date of Birth/Sex: 1930-08-27 (87 y.o. M) Treating RN: Carlene Coria Primary Care Provider:  Gaynelle Arabian Other Clinician: Referring Provider: Gaynelle Arabian Treating Provider/Extender: Skipper Cliche in Treatment: 1 Active Problems ICD-10 Encounter Code Description Active Date MDM Diagnosis I87.2 Venous insufficiency (chronic) (peripheral) 06/12/2020 No Yes I89.0 Lymphedema, not elsewhere classified 06/12/2020 No Yes L97.812 Non-pressure chronic ulcer of other part of right lower leg with fat layer 06/12/2020 No Yes exposed I48.0 Paroxysmal atrial fibrillation 06/12/2020 No Yes Inactive Problems Resolved Problems  Electronic Signature(s) Signed: 06/19/2020 9:04:03 AM By: Worthy Keeler PA-C Entered By: Worthy Keeler on 06/19/2020 09:04:03 [name] ([id number]) -------------------------------------------------------------------------------- Progress Note Details Patient Name: [name] Date of Service: 06/19/2020 8:30 AM Medical Record Number: [id number] Patient Account Number: [id number] Date of Birth/Sex: 1930-06-01 (87 y.o. M) Treating RN: Carlene Coria Primary Care Provider: Gaynelle Arabian Other Clinician: Referring Provider: Gaynelle Arabian Treating Provider/Extender: Skipper Cliche in Treatment: 1 Subjective Chief Complaint Information obtained from Patient Right LE Ulcers History of Present Illness (HPI) 87 year old gentleman has been referred to [place] by his PCP Dr. Gaynelle Arabian, for a ulcer on the left posterior lower leg which has been there for about 6 weeks. The patient was thought to have an infection and was put on doxycycline for 14 days and referred to the wound center. He has a past medical history of paroxysmal atrial fibrillation, hypertension, diverticulosis, status post right hip replacement, appendectomy. He has not been a smoker since 69. the anterior shin wound has been more recent about 2 weeks and this was caused probably due to a blunt abrasion. 03/06/17 on evaluation today patient's ulcer appears to show signs of excellent  improvement. He is definitely showing additional evidence of granulation which is great news. Overall I'm extremely pleased with the progress that he has been making from the beginning to where we are right now. With that being said I would like to potentially try different dressing to see if we could stimulate this to heal even faster. 03/13/17 on evaluation today patient appears to be doing little better in regard to his leg ulcer at this time. He has been tolerating the dressing changes without complication. There does not appear to be any evidence of infection which is good news. He does have some additional granulation observed at this point and I do believe that the silver collagen dressing has been of benefit in this regard. Overall I'm very pleased with the progress she's made since last visit the only downside is that he does have additional maceration due to drainage compared to previous we may need to do something to address this. 03/27/17 on evaluation today patient appears to be doing well in regard to his left posterior lower surety ulcer. He has a significant area of epithelial growth which seems to be doing excellent at this point in time. He also seems to be well controlled as far as slough is concerned and drainage there is no evidence of maceration and there really was no slough that could not be cleaned off with saline and gauze. Overall patient seems to be making excellent progress. 04/10/17 on evaluation today patient appears to be doing better in regard to the original ulcer for which we have been treating him. Unfortunately he has two areas where he sustained injury to his posterior leg more proximal to where the current ulceration is. This was due to it appears to be tape and adhesive at this point. When he was using the Allevyn dressing there was no problems however they ran out of these and since that point there seems to have been more irritation around the wound from the  Band-Aids that his wife has been using in their place. Other than this which appear to be very superficial new ulcers everything appears to be progressing nicely. 04/24/17 on evaluation today patient appears to be doing excellent in regard to his left posterior lower extremity ulcer. He is been tolerating the dressing changes without complication fortunately there does not  appear to be any evidence of infection at this point in time. Overall he has made significant improvements since I last saw him. 05/08/17 on evaluation today patient's wound appears to be completely healed at this point. Fortunately this is great news and though it is closed over with epithelium there still some healing to be done underneath for this completely toughen up this was explained to the patient and his wife today as well. Readmission: 06/12/2020 this is a gentleman whom I have previously seen here in the clinic before in regards to similar issues with wounds on his lower extremities. 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 He is also very swollen the skin is tense and even shiny due to the fluid buildup. Obviously I think this is definitely something that we need to address sooner rather than later as far as that is concerned. Fortunately there does not appear to be any signs of active infection he is on a blood thinner, Coumadin, this is due to chronic atrial fibrillation. 06/19/2020 upon evaluation today patient's wound is actually showing signs of significant improvement. I am actually extremely pleased with where things stand he is going require little bit of debridement to clear away some of the necrotic debris on the surface of the wound he actually is okay with that he is on Coumadin selective do this carefully but nonetheless I think it is definitely  achievable. Objective Constitutional Well-nourished and well-hydrated in no acute distress. HYATT, CAPOBIANCO ([id number]) Vitals Time Taken: 8:45 AM, Height: 71 in, Weight: 209 lbs, BMI: 29.1, Temperature: 98.3 F, Pulse: 77 bpm, Respiratory Rate: 20 breaths/min, Blood Pressure: 123/73 mmHg. Respiratory normal breathing without difficulty. Psychiatric this patient is able to make decisions and demonstrates good insight into disease process. Alert and Oriented x 3. pleasant and cooperative. General Notes: Patient's wound bed showed signs of good granulation epithelization at this point. There does not appear to be any signs of infection which is great overall very pleased with where things stand today. No fevers, chills, nausea, vomiting, or diarrhea. I did perform sharp debridement clearly some of the necrotic debris he tolerated that today without complication postdebridement the wound bed appears to be doing much better. Integumentary (Hair, Skin) Wound #5 status is Open. Original cause of wound was Gradually Appeared. The date acquired was: 04/29/2020. The wound has been in treatment 1 weeks. The wound is located on the Right,Lateral Lower Leg. The wound measures 3.1cm length x 2.1cm width x 0.1cm depth; 5.113cm^2 area and 0.511cm^3 volume. There is Fat Layer (Subcutaneous Tissue) exposed. There is no tunneling or undermining noted. There is a medium amount of serosanguineous drainage noted. There is medium (34-66%) red, pink granulation within the wound bed. There is a medium (34-66%) amount of necrotic tissue within the wound bed including Adherent Slough. Assessment Active Problems ICD-10 Venous insufficiency (chronic) (peripheral) Lymphedema, not elsewhere classified Non-pressure chronic ulcer of other part of right lower leg with fat layer exposed Paroxysmal atrial fibrillation Procedures Wound #5 Pre-procedure diagnosis of Wound #5 is a Venous Leg Ulcer located on the  Right,Lateral Lower Leg .Severity of Tissue Pre Debridement is: Fat layer exposed. There was a Excisional Skin/Subcutaneous Tissue Debridement with a total area of 6.51 sq cm performed by Tommie Sams., PA-C. With the following instrument(s): Curette to remove Viable and Non-Viable tissue/material. Material removed includes Subcutaneous  Tissue and Slough and after achieving pain control using Lidocaine 4% Topical Solution. No specimens were taken. A time out was conducted at 09:04, prior to the start of the procedure. A Moderate amount of bleeding was controlled with Pressure. The procedure was tolerated well with a pain level of 0 throughout and a pain level of 0 following the procedure. Post Debridement Measurements: 3.1cm length x 2.1cm width x 0.1cm depth; 0.511cm^3 volume. Character of Wound/Ulcer Post Debridement is improved. Severity of Tissue Post Debridement is: Fat layer exposed. Post procedure Diagnosis Wound #5: Same as Pre-Procedure Plan Follow-up Appointments: Return Appointment in 1 week. Bathing/ Shower/ Hygiene: May shower with wound dressing protected with water repellent cover or cast protector. Edema Control - Lymphedema / Segmental Compressive Device / Other: Optional: One layer of unna paste to top of compression wrap (to act as an anchor). Elevate, Exercise Daily and Avoid Standing for Long Periods of Time. Elevate legs to the level of the heart and pump ankles as often as possible Elevate leg(s) parallel to the floor when sitting. WOUND #5: - Lower Leg Wound Laterality: Right, Lateral Cleanser: Soap and Water 2 x Per Week/ Discharge Instructions: Gently cleanse wound with antibacterial soap, rinse and pat dry prior to dressing wounds Primary Dressing: Prisma 4.34 (in) 2 x Per Week/ [name], [name] ([id number]) Discharge Instructions: Moisten w/normal saline or sterile water; Cover wound as directed. Do not remove from wound bed. Secondary Dressing: Xtrasorb Medium  4x5 (in/in) 2 x Per Week/ Discharge Instructions: Apply to wound as directed. Do not cut. Compression Wrap: Profore Lite LF 3 Multilayer Compression Bandaging System 2 x Per Week/ Discharge Instructions: Apply 3 multi-layer wrap as prescribed. 1. Would recommend currently that we going to continue with the wound care measures as before and the patient is in agreement with the plan. This includes the use of the silver collagen followed by Lauraine Rinne and then a 3 layer compression wrap. 2. Also can recommend that he continue to elevate his legs much as possible try to help with edema control. 3. I am also recommending patient continue to monitor for any signs of overall worsening infection. Obviously I think this is definitely something to keep in mind as we go forward though right now everything appears to be doing great. We will see patient back for reevaluation in 1 week here in the clinic. If anything worsens or changes patient will contact our office for additional recommendations. Electronic Signature(s) Signed: 06/19/2020 1:45:34 PM By: Worthy Keeler PA-C Previous Signature: 06/19/2020 1:44:31 PM Version By: Worthy Keeler PA-C Entered By: Worthy Keeler on 06/19/2020 13:45:34 Macmurray, [name] ([id number]) -------------------------------------------------------------------------------- SuperBill Details Patient Name: [name] Date of Service: 06/19/2020 Medical Record Number: [id number] Patient Account Number: [id number] Date of Birth/Sex: 1930/03/25 (87 y.o. M) Treating RN: Carlene Coria Primary Care Provider: Gaynelle Arabian Other Clinician: Referring Provider: Gaynelle Arabian Treating Provider/Extender: Skipper Cliche in Treatment: 1 Diagnosis Coding ICD-10 Codes Code Description I87.2 Venous insufficiency (chronic) (peripheral) I89.0 Lymphedema, not elsewhere classified L97.812 Non-pressure chronic ulcer of other part of right lower leg with fat layer exposed I48.0  Paroxysmal atrial fibrillation Facility Procedures CPT4 Code: [id number] Description: 27035 - DEB SUBQ TISSUE 20 SQ CM/< Modifier: Quantity: 1 CPT4 Code: Description: ICD-10 Diagnosis Description K09.381 Non-pressure chronic ulcer of other part of right lower leg with fat lay Modifier: er exposed Quantity: Physician Procedures CPT4 Code: 8299371 Description: 11042 - WC PHYS SUBQ TISS 20 SQ CM Modifier: Quantity: 1 CPT4 Code:  Description: ICD-10 Diagnosis Description L97.812 Non-pressure chronic ulcer of other part of right lower leg with fat lay Modifier: er exposed Quantity: Electronic Signature(s) Signed: 06/19/2020 1:45:41 PM By: Worthy Keeler PA-C Entered By: Worthy Keeler on 06/19/2020 13:45:40

## 2020-06-26 ENCOUNTER — Encounter: Payer: Medicare Other | Admitting: Physician Assistant

## 2020-06-26 ENCOUNTER — Other Ambulatory Visit: Payer: Self-pay

## 2020-06-26 DIAGNOSIS — Z7901 Long term (current) use of anticoagulants: Secondary | ICD-10-CM | POA: Diagnosis not present

## 2020-06-26 DIAGNOSIS — L97812 Non-pressure chronic ulcer of other part of right lower leg with fat layer exposed: Secondary | ICD-10-CM | POA: Diagnosis not present

## 2020-06-26 DIAGNOSIS — I872 Venous insufficiency (chronic) (peripheral): Secondary | ICD-10-CM | POA: Diagnosis not present

## 2020-06-26 DIAGNOSIS — I890 Lymphedema, not elsewhere classified: Secondary | ICD-10-CM | POA: Diagnosis not present

## 2020-06-26 DIAGNOSIS — L97512 Non-pressure chronic ulcer of other part of right foot with fat layer exposed: Secondary | ICD-10-CM | POA: Diagnosis not present

## 2020-06-26 DIAGNOSIS — S51011A Laceration without foreign body of right elbow, initial encounter: Secondary | ICD-10-CM | POA: Diagnosis not present

## 2020-06-26 NOTE — Progress Notes (Addendum)
[name], Danny J. ([id number]) Visit Report for 06/26/2020 Chief Complaint Document Details Patient Name: [name], Danny J. Date of Service: 06/26/2020 9:30 AM Medical Record Number: [id number] Patient Account Number: [id number] Date of Birth/Sex: 08/24/1930 (87 y.o. M) Treating RN: Epps, Carrie Primary Care Provider: Ehinger, Robert Other Clinician: Referring Provider: Ehinger, Robert Treating Provider/Extender: Stone,  Weeks in Treatment: 2 Information Obtained from: Patient Chief Complaint Right LE Ulcers Electronic Signature(s) Signed: 06/26/2020 9:27:18 AM By: Stone III,  PA-C Entered By: Stone III,  on 06/26/2020 09:27:18 [name], Danny J. ([id number]) -------------------------------------------------------------------------------- Debridement Details Patient Name: [name], Danny J. Date of Service: 06/26/2020 9:30 AM Medical Record Number: [id number] Patient Account Number: [id number] Date of Birth/Sex: 01/08/1931 (87 y.o. M) Treating RN: Epps, Carrie Primary Care Provider: Ehinger, Robert Other Clinician: Referring Provider: Ehinger, Robert Treating Provider/Extender: Stone,  Weeks in Treatment: 2 Debridement Performed for Wound #5 Right,Lateral Lower Leg Assessment: Performed By: Physician Stone,  E., PA-C Debridement Type: Debridement Severity of Tissue Pre Debridement: Fat layer exposed Level of Consciousness (Pre- Awake and Alert procedure): Pre-procedure Verification/Time Out Yes - 10:29 Taken: Start Time: 10:29 Pain Control: Lidocaine 4% Topical Solution Total Area Debrided (L x W): 2.8 (cm) x 1.8 (cm) = 5.04 (cm) Tissue and other material Viable, Non-Viable, Slough, Subcutaneous, Skin: Dermis , Skin: Epidermis, Slough debrided: Level: Skin/Subcutaneous Tissue Debridement Description: Excisional Instrument: Curette Bleeding: Minimum Hemostasis Achieved: Pressure End Time: 10:32 Procedural Pain: 0 Post Procedural Pain: 0 Response to Treatment:  Procedure was tolerated well Level of Consciousness (Post- Awake and Alert procedure): Post Debridement Measurements of Total Wound Length: (cm) 2.8 Width: (cm) 1.8 Depth: (cm) 0.1 Volume: (cm) 0.396 Character of Wound/Ulcer Post Debridement: Improved Severity of Tissue Post Debridement: Fat layer exposed Post Procedure Diagnosis Same as Pre-procedure Electronic Signature(s) Signed: 06/26/2020 4:51:48 PM By: Stone III,  PA-C Signed: 07/01/2020 4:27:57 PM By: Epps, Carrie RN Entered By: Epps, Carrie on 06/26/2020 10:33:20 [name], Danny J. ([id number]) -------------------------------------------------------------------------------- HPI Details Patient Name: [name], Danny J. Date of Service: 06/26/2020 9:30 AM Medical Record Number: [id number] Patient Account Number: [id number] Date of Birth/Sex: 07/27/1930 (87 y.o. M) Treating RN: Epps, Carrie Primary Care Provider: Ehinger, Robert Other Clinician: Referring Provider: Ehinger, Robert Treating Provider/Extender: Stone,  Weeks in Treatment: 2 History of Present Illness HPI Description: 86-year-old gentleman has been referred to us by his PCP Dr. Robert Ehinger, for a ulcer on the left posterior lower leg which has been there for about 6 weeks. The patient was thought to have an infection and was put on doxycycline for 14 days and referred to the wound center. He has a past medical history of paroxysmal atrial fibrillation, hypertension, diverticulosis, status post right hip replacement, appendectomy. He has not been a smoker since 1980. the anterior shin wound has been more recent about 2 weeks and this was caused probably due to a blunt abrasion. 03/06/17 on evaluation today patient's ulcer appears to show signs of excellent improvement. He is definitely showing additional evidence of granulation which is great news. Overall I'm extremely pleased with the progress that he has been making from the beginning to where we are right now.  With that being said I would like to potentially try different dressing to see if we could stimulate this to heal even faster. 03/13/17 on evaluation today patient appears to be doing little better in regard to his leg ulcer at this time. He has been tolerating the dressing changes without complication. There does not appear to be any evidence of infection which is   good news. He does have some additional granulation observed at this point and I do believe that the silver collagen dressing has been of benefit in this regard. Overall I'm very pleased with the progress she's made since last visit the only downside is that he does have additional maceration due to drainage compared to previous we may need to do something to address this. 03/27/17 on evaluation today patient appears to be doing well in regard to his left posterior lower surety ulcer. He has a significant area of epithelial growth which seems to be doing excellent at this point in time. He also seems to be well controlled as far as slough is concerned and drainage there is no evidence of maceration and there really was no slough that could not be cleaned off with saline and gauze. Overall patient seems to be making excellent progress. 04/10/17 on evaluation today patient appears to be doing better in regard to the original ulcer for which we have been treating him. Unfortunately he has two areas where he sustained injury to his posterior leg more proximal to where the current ulceration is. This was due to it appears to be tape and adhesive at this point. When he was using the Allevyn dressing there was no problems however they ran out of these and since that point there seems to have been more irritation around the wound from the Band-Aids that his wife has been using in their place. Other than this which appear to be very superficial new ulcers everything appears to be progressing nicely. 04/24/17 on evaluation today patient appears to be doing  excellent in regard to his left posterior lower extremity ulcer. He is been tolerating the dressing changes without complication fortunately there does not appear to be any evidence of infection at this point in time. Overall he has made significant improvements since I last saw him. 05/08/17 on evaluation today patient's wound appears to be completely healed at this point. Fortunately this is great news and though it is closed over with epithelium there still some healing to be done underneath for this completely toughen up this was explained to the patient and his wife today as well. Readmission: 06/12/2020 this is a gentleman whom I have previously seen here in the clinic before in regards to similar issues with wounds on his lower extremities. 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 He is also very swollen the skin is tense and even shiny due to the fluid buildup. Obviously I think this is definitely something that we need to address sooner rather than later as far as that is concerned. Fortunately there does not appear to be any signs of active infection he is on a blood thinner, Coumadin, this is due to chronic atrial fibrillation. 06/19/2020 upon evaluation today patient's wound is actually showing signs of significant improvement. I am actually extremely pleased with where things stand he is going require little bit of debridement to clear away some of the necrotic debris on the surface of the wound he actually is okay with that he is on Coumadin selective do this carefully but nonetheless I think it is definitely achievable.. 06/26/2020 upon evaluation today patient appears to be doing well in regard to his leg ulcer. I feel like he is showing signs of improvement and overall very pleased in this regard. Unfortunately he did have  a fall where he injured  his right elbow this is new and subsequently does need to be addressed as well with dressings today. Fortunately it does not appear to be too bad. Electronic Signature(s) Signed: 06/26/2020 11:11:59 AM By: Stone III,  PA-C Entered By: Stone III,  on 06/26/2020 11:11:58 [name], Danny J. ([id number]) -------------------------------------------------------------------------------- Physical Exam Details Patient Name: [name], Danny J. Date of Service: 06/26/2020 9:30 AM Medical Record Number: [id number] Patient Account Number: [id number] Date of Birth/Sex: 08/06/1930 (87 y.o. M) Treating RN: Epps, Carrie Primary Care Provider: Ehinger, Robert Other Clinician: Referring Provider: Ehinger, Robert Treating Provider/Extender: Stone,  Weeks in Treatment: 2 Constitutional Well-nourished and well-hydrated in no acute distress. Respiratory normal breathing without difficulty. Psychiatric this patient is able to make decisions and demonstrates good insight into disease process. Alert and Oriented x 3. pleasant and cooperative. Notes Upon inspection patient's wound on the leg actually appears to be showing signs of excellent improvement which is great news is measuring smaller and looking better. I did have to perform a little bit of sharp debridement to clear this away today he tolerated that without complication. In regard to his wound on the elbow this is a completely new issue which unfortunately occurred as a result of a fall. He does have a skin tear which is going to have to be managed currently. Electronic Signature(s) Signed: 06/26/2020 11:12:31 AM By: Stone III,  PA-C Entered By: Stone III,  on 06/26/2020 11:12:31 [name], Danny J. ([id number]) -------------------------------------------------------------------------------- Physician Orders Details Patient Name: [name], Danny J. Date of Service: 06/26/2020 9:30 AM Medical Record Number: [id number] Patient Account Number:  [id number] Date of Birth/Sex: 09/04/1930 (87 y.o. M) Treating RN: Epps, Carrie Primary Care Provider: Ehinger, Robert Other Clinician: Referring Provider: Ehinger, Robert Treating Provider/Extender: Stone,  Weeks in Treatment: 2 Verbal / Phone Orders: No Diagnosis Coding ICD-10 Coding Code Description I87.2 Venous insufficiency (chronic) (peripheral) I89.0 Lymphedema, not elsewhere classified L97.812 Non-pressure chronic ulcer of other part of right lower leg with fat layer exposed I48.0 Paroxysmal atrial fibrillation Follow-up Appointments o Return Appointment in 1 week. Bathing/ Shower/ Hygiene o May shower with wound dressing protected with water repellent cover or cast protector. Edema Control - Lymphedema / Segmental Compressive Device / Other o Optional: One layer of unna paste to top of compression wrap (to act as an anchor). o Elevate, Exercise Daily and Avoid Standing for Long Periods of Time. o Elevate legs to the level of the heart and pump ankles as often as possible o Elevate leg(s) parallel to the floor when sitting. Wound Treatment Wound #5 - Lower Leg Wound Laterality: Right, Lateral Cleanser: Byram Ancillary Kit - 15 Day Supply (Generic) 1 x Per Week Discharge Instructions: Use supplies as instructed; Kit contains: (15) Saline Bullets; (15) 3x3 Gauze; 15 pr Gloves Cleanser: Soap and Water 1 x Per Week Discharge Instructions: Gently cleanse wound with antibacterial soap, rinse and pat dry prior to dressing wounds Primary Dressing: Prisma 4.34 (in) (Generic) 1 x Per Week Discharge Instructions: Moisten w/normal saline or sterile water; Cover wound as directed. Do not remove from wound bed. Secondary Dressing: Xtrasorb Medium 4x5 (in/in) (Generic) 1 x Per Week Discharge Instructions: Apply to wound as directed. Do not cut. Compression Wrap: Profore Lite LF 3 Multilayer Compression Bandaging System 1 x Per Week Discharge Instructions: Apply 3 multi-layer  wrap as prescribed. Wound #6 - Elbow Wound Laterality: Right Cleanser: Byram Ancillary Kit - 15 Day Supply (DME) (Generic) 1 x Per Day/30 Days Discharge Instructions: Use supplies   as instructed; Kit contains: (15) Saline Bullets; (15) 3x3 Gauze; 15 pr Gloves Cleanser: Normal Saline 1 x Per Day/30 Days Discharge Instructions: Wash your hands with soap and water. Remove old dressing, discard into plastic bag and place into trash. Cleanse the wound with Normal Saline prior to applying a clean dressing using gauze sponges, not tissues or cotton balls. Do not scrub or use excessive force. Pat dry using gauze sponges, not tissue or cotton balls. Primary Dressing: Xeroform 4x4-HBD (in/in) (DME) (Generic) 1 x Per Day/30 Days Discharge Instructions: Apply Xeroform 4x4-HBD (in/in) as directed Secondary Dressing: ABD Pad 5x9 (in/in) (DME) (Generic) 1 x Per Day/30 Days Discharge Instructions: Cover with ABD pad Secured With: Stretch Net Size 10 (yds) 1 x Per Day/30 Days Discharge Instructions: To secure dressing in place. ALIZE, ACY ([id number]) Electronic Signature(s) Signed: 07/05/2020 7:08:44 PM By: Carlene Coria RN Signed: 07/07/2020 4:56:16 PM By: Worthy Keeler PA-C Previous Signature: 06/26/2020 4:51:48 PM Version By: Worthy Keeler PA-C Previous Signature: 07/01/2020 4:27:57 PM Version By: Carlene Coria RN Entered By: Carlene Coria on 07/03/2020 10:54:40 [name] ([id number]) -------------------------------------------------------------------------------- Problem List Details Patient Name: [name], STEMM. Date of Service: 06/26/2020 9:30 AM Medical Record Number: [id number] Patient Account Number: [id number] Date of Birth/Sex: 1930/11/14 (87 y.o. M) Treating RN: Carlene Coria Primary Care Provider: Gaynelle Arabian Other Clinician: Referring Provider: Gaynelle Arabian Treating Provider/Extender: Skipper Cliche in Treatment: 2 Active Problems ICD-10 Encounter Code Description Active  Date MDM Diagnosis I87.2 Venous insufficiency (chronic) (peripheral) 06/12/2020 No Yes I89.0 Lymphedema, not elsewhere classified 06/12/2020 No Yes L97.812 Non-pressure chronic ulcer of other part of right lower leg with fat layer 06/12/2020 No Yes exposed S51.011A Laceration without foreign body of right elbow, initial encounter 06/26/2020 No Yes I48.0 Paroxysmal atrial fibrillation 06/12/2020 No Yes Inactive Problems Resolved Problems Electronic Signature(s) Signed: 06/26/2020 11:11:50 AM By: Worthy Keeler PA-C Previous Signature: 06/26/2020 9:27:10 AM Version By: Worthy Keeler PA-C Entered By: Worthy Keeler on 06/26/2020 11:11:50 [name] ([id number]) -------------------------------------------------------------------------------- Progress Note Details Patient Name: [name] Date of Service: 06/26/2020 9:30 AM Medical Record Number: [id number] Patient Account Number: [id number] Date of Birth/Sex: January 16, 1930 (87 y.o. M) Treating RN: Carlene Coria Primary Care Provider: Gaynelle Arabian Other Clinician: Referring Provider: Gaynelle Arabian Treating Provider/Extender: Skipper Cliche in Treatment: 2 Subjective Chief Complaint Information obtained from Patient Right LE Ulcers History of Present Illness (HPI) 87 year old gentleman has been referred to [place] by his PCP Dr. Gaynelle Arabian, for a ulcer on the left posterior lower leg which has been there for about 6 weeks. The patient was thought to have an infection and was put on doxycycline for 14 days and referred to the wound center. He has a past medical history of paroxysmal atrial fibrillation, hypertension, diverticulosis, status post right hip replacement, appendectomy. He has not been a smoker since 12. the anterior shin wound has been more recent about 2 weeks and this was caused probably due to a blunt abrasion. 03/06/17 on evaluation today patient's ulcer appears to show signs of excellent improvement. He is definitely  showing additional evidence of granulation which is great news. Overall I'm extremely pleased with the progress that he has been making from the beginning to where we are right now. With that being said I would like to potentially try different dressing to see if we could stimulate this to heal even faster. 03/13/17 on evaluation today patient appears to be doing little better in regard to his leg  ulcer at this time. He has been tolerating the dressing changes without complication. There does not appear to be any evidence of infection which is good news. He does have some additional granulation observed at this point and I do believe that the silver collagen dressing has been of benefit in this regard. Overall I'm very pleased with the progress she's made since last visit the only downside is that he does have additional maceration due to drainage compared to previous we may need to do something to address this. 03/27/17 on evaluation today patient appears to be doing well in regard to his left posterior lower surety ulcer. He has a significant area of epithelial growth which seems to be doing excellent at this point in time. He also seems to be well controlled as far as slough is concerned and drainage there is no evidence of maceration and there really was no slough that could not be cleaned off with saline and gauze. Overall patient seems to be making excellent progress. 04/10/17 on evaluation today patient appears to be doing better in regard to the original ulcer for which we have been treating him. Unfortunately he has two areas where he sustained injury to his posterior leg more proximal to where the current ulceration is. This was due to it appears to be tape and adhesive at this point. When he was using the Allevyn dressing there was no problems however they ran out of these and since that point there seems to have been more irritation around the wound from the Band-Aids that his wife has been  using in their place. Other than this which appear to be very superficial new ulcers everything appears to be progressing nicely. 04/24/17 on evaluation today patient appears to be doing excellent in regard to his left posterior lower extremity ulcer. He is been tolerating the dressing changes without complication fortunately there does not appear to be any evidence of infection at this point in time. Overall he has made significant improvements since I last saw him. 05/08/17 on evaluation today patient's wound appears to be completely healed at this point. Fortunately this is great news and though it is closed over with epithelium there still some healing to be done underneath for this completely toughen up this was explained to the patient and his wife today as well. Readmission: 06/12/2020 this is a gentleman whom I have previously seen here in the clinic before in regards to similar issues with wounds on his lower extremities. 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 He is also very swollen the skin is tense and even shiny due to the fluid buildup. Obviously I think this is definitely something that we need to address sooner rather than later as far as that is concerned. Fortunately there does not appear to be any signs of active infection he is on a blood thinner, Coumadin, this is due to chronic atrial fibrillation. 06/19/2020 upon evaluation today patient's wound is actually showing signs of significant improvement. I am actually extremely pleased with where things stand he is going require little bit of debridement to clear away some of the necrotic debris on the surface of the wound he actually is okay with that he is on Coumadin selective do this carefully but nonetheless I think it is definitely achievable.. 06/26/2020 upon evaluation today patient  appears to be doing well   in regard to his leg ulcer. I feel like he is showing signs of improvement and overall very pleased in this regard. Unfortunately he did have a fall where he injured his right elbow this is new and subsequently does need to be addressed as well with dressings today. Fortunately it does not appear to be too bad. [name], Danny J. ([id number]) Objective Constitutional Well-nourished and well-hydrated in no acute distress. Vitals Time Taken: 9:55 AM, Height: 71 in, Weight: 209 lbs, BMI: 29.1, Temperature: 98.0 °F, Pulse: 93 bpm, Respiratory Rate: 20 breaths/min, Blood Pressure: 162/97 mmHg. Respiratory normal breathing without difficulty. Psychiatric this patient is able to make decisions and demonstrates good insight into disease process. Alert and Oriented x 3. pleasant and cooperative. General Notes: Upon inspection patient's wound on the leg actually appears to be showing signs of excellent improvement which is great news is measuring smaller and looking better. I did have to perform a little bit of sharp debridement to clear this away today he tolerated that without complication. In regard to his wound on the elbow this is a completely new issue which unfortunately occurred as a result of a fall. He does have a skin tear which is going to have to be managed currently. Integumentary (Hair, Skin) Wound #5 status is Open. Original cause of wound was Gradually Appeared. The date acquired was: 04/29/2020. The wound has been in treatment 2 weeks. The wound is located on the Right,Lateral Lower Leg. The wound measures 2.8cm length x 1.8cm width x 0.1cm depth; 3.958cm^2 area and 0.396cm^3 volume. There is Fat Layer (Subcutaneous Tissue) exposed. There is no tunneling or undermining noted. There is a medium amount of serosanguineous drainage noted. There is medium (34-66%) red granulation within the wound bed. There is a medium (34-66%) amount of necrotic tissue within the wound  bed including Adherent Slough. Wound #6 status is Open. Original cause of wound was Trauma. The date acquired was: 06/23/2020. The wound is located on the Right Elbow. The wound measures 5cm length x 2cm width x 0.1cm depth; 7.854cm^2 area and 0.785cm^3 volume. There is Fat Layer (Subcutaneous Tissue) exposed. There is no tunneling or undermining noted. There is a large amount of serous drainage noted. There is medium (34-66%) pink granulation within the wound bed. There is a medium (34-66%) amount of necrotic tissue within the wound bed including Adherent Slough. Assessment Active Problems ICD-10 Venous insufficiency (chronic) (peripheral) Lymphedema, not elsewhere classified Non-pressure chronic ulcer of other part of right lower leg with fat layer exposed Laceration without foreign body of right elbow, initial encounter Paroxysmal atrial fibrillation Procedures Wound #5 Pre-procedure diagnosis of Wound #5 is a Venous Leg Ulcer located on the Right,Lateral Lower Leg .Severity of Tissue Pre Debridement is: Fat layer exposed. There was a Excisional Skin/Subcutaneous Tissue Debridement with a total area of 5.04 sq cm performed by Stone,  E., PA-C. With the following instrument(s): Curette to remove Viable and Non-Viable tissue/material. Material removed includes Subcutaneous Tissue, Slough, Skin: Dermis, and Skin: Epidermis after achieving pain control using Lidocaine 4% Topical Solution. No specimens were taken. A time out was conducted at 10:29, prior to the start of the procedure. A Minimum amount of bleeding was controlled with Pressure. The procedure was tolerated well with a pain level of 0 throughout and a pain level of 0 following the procedure. Post Debridement Measurements: 2.8cm length x 1.8cm width x 0.1cm depth; 0.396cm^3 volume. Character of Wound/Ulcer Post Debridement is improved. Severity of Tissue Post Debridement is: Fat layer exposed.   Post procedure Diagnosis Wound #5:  Same as Pre-Procedure Plan Follow-up Appointments: Return Appointment in 1 week. [name], Danny J. ([id number]) Bathing/ Shower/ Hygiene: May shower with wound dressing protected with water repellent cover or cast protector. Edema Control - Lymphedema / Segmental Compressive Device / Other: Optional: One layer of unna paste to top of compression wrap (to act as an anchor). Elevate, Exercise Daily and Avoid Standing for Long Periods of Time. Elevate legs to the level of the heart and pump ankles as often as possible Elevate leg(s) parallel to the floor when sitting. WOUND #5: - Lower Leg Wound Laterality: Right, Lateral Cleanser: Byram Ancillary Kit - 15 Day Supply (Generic) 2 x Per Week/ Discharge Instructions: Use supplies as instructed; Kit contains: (15) Saline Bullets; (15) 3x3 Gauze; 15 pr Gloves Cleanser: Soap and Water 2 x Per Week/ Discharge Instructions: Gently cleanse wound with antibacterial soap, rinse and pat dry prior to dressing wounds Primary Dressing: Prisma 4.34 (in) (Generic) 2 x Per Week/ Discharge Instructions: Moisten w/normal saline or sterile water; Cover wound as directed. Do not remove from wound bed. Secondary Dressing: Xtrasorb Medium 4x5 (in/in) (Generic) 2 x Per Week/ Discharge Instructions: Apply to wound as directed. Do not cut. Compression Wrap: Profore Lite LF 3 Multilayer Compression Bandaging System 2 x Per Week/ Discharge Instructions: Apply 3 multi-layer wrap as prescribed. WOUND #6: - Elbow Wound Laterality: Right Cleanser: Byram Ancillary Kit - 15 Day Supply (DME) (Generic) 1 x Per Day/30 Days Discharge Instructions: Use supplies as instructed; Kit contains: (15) Saline Bullets; (15) 3x3 Gauze; 15 pr Gloves Cleanser: Normal Saline 1 x Per Day/30 Days Discharge Instructions: Wash your hands with soap and water. Remove old dressing, discard into plastic bag and place into trash. Cleanse the wound with Normal Saline prior to applying a clean dressing  using gauze sponges, not tissues or cotton balls. Do not scrub or use excessive force. Pat dry using gauze sponges, not tissue or cotton balls. Primary Dressing: Xeroform 4x4-HBD (in/in) (DME) (Generic) 1 x Per Day/30 Days Discharge Instructions: Apply Xeroform 4x4-HBD (in/in) as directed Secondary Dressing: ABD Pad 5x9 (in/in) (DME) (Generic) 1 x Per Day/30 Days Discharge Instructions: Cover with ABD pad Secured With: Stretch Net Size 10 (yds) 1 x Per Day/30 Days Discharge Instructions: To secure dressing in place. 1. Patient's wound currently showed signs of good granulation epithelization in regard to the leg. I am very pleased we will get a continue with the collagen here followed by XtraSorb and a 3 layer compression wrap. 2. In regard to the elbow where he has a new injury I am actually go ahead and initiate treatment with Xeroform gauze at this point. We will also use an ABD pad and stretch net to secure in place. 3. I am also can recommend the patient continue to be very careful with regard to following we do not want him to continue to have additional injuries obviously and things could even be worse than what he experiences right now I really do think he should be using probably a walker to get around to be honest. We will see patient back for reevaluation in 1 week here in the clinic. If anything worsens or changes patient will contact our office for additional recommendations. Electronic Signature(s) Signed: 06/26/2020 1:44:45 PM By: Stone III,  PA-C Entered By: Stone III,  on 06/26/2020 13:44:45 Modeste, Heron J. ([id number]) -------------------------------------------------------------------------------- SuperBill Details Patient Name: Bohnsack, Brogan J. Date of Service: 06/26/2020 Medical Record Number: [id number] Patient Account Number: [id number] Date   of Birth/Sex: 18-Oct-1930 (87 y.o. M) Treating RN: Carlene Coria Primary Care Provider: Gaynelle Arabian Other  Clinician: Referring Provider: Gaynelle Arabian Treating Provider/Extender: Skipper Cliche in Treatment: 2 Diagnosis Coding ICD-10 Codes Code Description I87.2 Venous insufficiency (chronic) (peripheral) I89.0 Lymphedema, not elsewhere classified L97.812 Non-pressure chronic ulcer of other part of right lower leg with fat layer exposed S51.011A Laceration without foreign body of right elbow, initial encounter I48.0 Paroxysmal atrial fibrillation Facility Procedures CPT4 Code: 85631497 Description: 02637 - DEB SUBQ TISSUE 20 SQ CM/< Modifier: Quantity: 1 CPT4 Code: Description: ICD-10 Diagnosis Description C58.850 Non-pressure chronic ulcer of other part of right lower leg with fat lay Modifier: er exposed Quantity: Physician Procedures CPT4 Code: 2774128 Description: 99214 - WC PHYS LEVEL 4 - EST PT Modifier: 25 Quantity: 1 CPT4 Code: Description: ICD-10 Diagnosis Description I87.2 Venous insufficiency (chronic) (peripheral) I89.0 Lymphedema, not elsewhere classified L97.812 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

## 2020-06-29 NOTE — Progress Notes (Addendum)
GERREN, HOFFMEIER ([id number]) Visit Report for 06/26/2020 Arrival Information Details Patient Name: [name], [name]. Date of Service: 06/26/2020 9:30 AM Medical Record Number: [id number] Patient Account Number: [id number] Date of Birth/Sex: 1930-03-16 (87 y.o. M) Treating RN: Carlene Coria Primary Care Lajuanda Penick: Gaynelle Arabian Other Clinician: Referring Alaynna Kerwood: Gaynelle Arabian Treating Khy Pitre/Extender: Skipper Cliche in Treatment: 2 Visit Information History Since Last Visit Added or deleted any medications: No Patient Arrived: [name] Had a fall or experienced change in Yes Arrival Time: 09:53 activities of daily living that may affect Accompanied By: daughter in law risk of falls: Transfer Assistance: None Hospitalized since last visit: No Patient Identification Verified: Yes Pain Present Now: No Secondary Verification Process Completed: Yes Patient Requires Transmission-Based No Precautions: Patient Has Alerts: Yes Patient Alerts: Patient on Blood Thinner NOT DIABETIC ***WARFARIN*** Electronic Signature(s) Signed: 06/30/2020 4:23:36 PM By: Jeanine Luz Entered By: Jeanine Luz on 06/26/2020 09:57:56 Scheper, [name] ([id number]) -------------------------------------------------------------------------------- Clinic Level of Care Assessment Details Patient Name: [name] Date of Service: 06/26/2020 9:30 AM Medical Record Number: [id number] Patient Account Number: [id number] Date of Birth/Sex: 10/29/1930 (87 y.o. M) Treating RN: Carlene Coria Primary Care Deniyah Dillavou: Gaynelle Arabian Other Clinician: Referring Louden Houseworth: Gaynelle Arabian Treating Ulysees Robarts/Extender: Skipper Cliche in Treatment: 2 Clinic Level of Care Assessment Items TOOL 1 Quantity Score _0  - Use when EandM and Procedure is performed on INITIAL visit 0 ASSESSMENTS - Nursing Assessment / Reassessment _1  - General Physical Exam (combine w/ comprehensive assessment (listed just below) when performed  on new 0 pt. evals) _2  - 0 Comprehensive Assessment (HX, ROS, Risk Assessments, Wounds Hx, etc.) ASSESSMENTS - Wound and Skin Assessment / Reassessment _3  - Dermatologic / Skin Assessment (not related to wound area) 0 ASSESSMENTS - Ostomy and/or Continence Assessment and Care _4  - Incontinence Assessment and Management 0 _5  - 0 Ostomy Care Assessment and Management (repouching, etc.) PROCESS - Coordination of Care _6  - Simple Patient / Family Education for ongoing care 0 _7  - 0 Complex (extensive) Patient / Family Education for ongoing care _8  - 0 Staff obtains Programmer, systems, Records, Test Results / Process Orders _9  - 0 Staff telephones HHA, Nursing Homes / Clarify orders / etc _10  - 0 Routine Transfer to another Facility (non-emergent condition) _11  - 0 Routine Hospital Admission (non-emergent condition) _12  - 0 New Admissions / Biomedical engineer / Ordering NPWT, Apligraf, etc. _13  - 0 Emergency Hospital Admission (emergent condition) PROCESS - Special Needs _14  - Pediatric / Minor Patient Management 0 _15  - 0 Isolation Patient Management _16  - 0 Hearing / Language / Visual special needs _17  - 0 Assessment of Community assistance (transportation, D/C planning, etc.) _18  - 0 Additional assistance / Altered mentation _19  - 0 Support Surface(s) Assessment (bed, cushion, seat, etc.) INTERVENTIONS - Miscellaneous _20  - External ear exam 0 _21  - 0 Patient Transfer (multiple staff / Civil Service fast streamer / Similar devices) _22  - 0 Simple Staple / Suture removal (25 or less) _23  - 0 Complex Staple / Suture removal (26 or more) _24  - 0 Hypo/Hyperglycemic Management (do not check if billed separately) _25  - 0 Ankle / Brachial Index (ABI) - do not check if billed separately Has the patient been seen at the hospital within the last three years: Yes Total Score: 0 Level Of Care: ____ [name] ([id number]) Electronic Signature(s) Signed: 07/01/2020 4:27:57 PM By: Carlene Coria RN Entered By:  Carlene Coria on 06/26/2020 10:33:51 [name] ([id number]) -------------------------------------------------------------------------------- Encounter Discharge Information Details Patient Name: [name] Date of  Service: 06/26/2020 9:30 AM Medical Record Number: [id number] Patient Account Number: [id number] Date of Birth/Sex: August 02, 1930 (87 y.o. M) Treating RN: Dolan Amen Primary Care Nailah Luepke: Gaynelle Arabian Other Clinician: Referring Tyrez Berrios: Gaynelle Arabian Treating Bhavya Eschete/Extender: Skipper Cliche in Treatment: 2 Encounter Discharge Information Items Post Procedure Vitals Discharge Condition: Stable Temperature (F): 98 Ambulatory Status: Cane Pulse (bpm): 93 Discharge Destination: Home Respiratory Rate (breaths/min): 20 Transportation: Private Auto Blood Pressure (mmHg): 162/97 Accompanied By: daughter in law Schedule Follow-up Appointment: Yes Clinical Summary of Care: Electronic Signature(s) Signed: 06/26/2020 4:57:24 PM By: Georges Mouse, Minus Breeding RN Entered By: Georges Mouse, Minus Breeding on 06/26/2020 10:55:49 [name] ([id number]) -------------------------------------------------------------------------------- Lower Extremity Assessment Details Patient Name: [name] Date of Service: 06/26/2020 9:30 AM Medical Record Number: [id number] Patient Account Number: [id number] Date of Birth/Sex: 29-Dec-1930 (87 y.o. M) Treating RN: Carlene Coria Primary Care Meleni Delahunt: Gaynelle Arabian Other Clinician: Referring Alantis Bethune: Gaynelle Arabian Treating Matayah Reyburn/Extender: Skipper Cliche in Treatment: 2 Edema Assessment Assessed: [Left: No] [Right: Yes] Edema: [Left: Ye] [Right: s] Calf Left: Right: Point of Measurement: 34 cm From Medial Instep 36 cm Ankle Left: Right: Point of Measurement: 10 cm From Medial Instep 24.4 cm Vascular Assessment Pulses: Dorsalis Pedis Palpable: [Right:Yes] Electronic Signature(s) Signed: 06/30/2020 4:23:36 PM By:  Jeanine Luz Signed: 07/01/2020 4:27:57 PM By: Carlene Coria RN Entered By: Jeanine Luz on 06/26/2020 10:18:50 [name] ([id number]) -------------------------------------------------------------------------------- Multi Wound Chart Details Patient Name: [name] Date of Service: 06/26/2020 9:30 AM Medical Record Number: [id number] Patient Account Number: [id number] Date of Birth/Sex: November 11, 1930 (87 y.o. M) Treating RN: Carlene Coria Primary Care Asuka Dusseau: Gaynelle Arabian Other Clinician: Referring Addalynn Kumari: Gaynelle Arabian Treating Audie Stayer/Extender: Skipper Cliche in Treatment: 2 Vital Signs Height(in): 70 Pulse(bpm): 7 Weight(lbs): 209 Blood Pressure(mmHg): 162/97 Body Mass Index(BMI): 29 Temperature(F): 98.0 Respiratory Rate(breaths/min): 20 Photos: [N/A:N/A] Wound Location: Right, Lateral Lower Leg Right Elbow N/A Wounding Event: Gradually Appeared Trauma N/A Primary Etiology: Venous Leg Ulcer Trauma, Other N/A Comorbid History: Cataracts, Arrhythmia, Cataracts, Arrhythmia, N/A Hypertension Hypertension Date Acquired: 04/29/2020 06/23/2020 N/A Weeks of Treatment: 2 0 N/A Wound Status: Open Open N/A Measurements L x W x D (cm) 2.8x1.8x0.1 5x2x0.1 N/A Area (cm) : 3.958 7.854 N/A Volume (cm) : 0.396 0.785 N/A % Reduction in Area: 47.30% N/A N/A % Reduction in Volume: 47.30% N/A N/A Classification: Full Thickness Without Exposed Partial Thickness N/A Support Structures Exudate Amount: Medium Large N/A Exudate Type: Serosanguineous Serous N/A Exudate Color: red, brown amber N/A Granulation Amount: Medium (34-66%) Medium (34-66%) N/A Granulation Quality: Red Pink N/A Necrotic Amount: Medium (34-66%) Medium (34-66%) N/A Exposed Structures: Fat Layer (Subcutaneous Tissue): Fat Layer (Subcutaneous Tissue): N/A Yes Yes Fascia: No Fascia: No Tendon: No Tendon: No Muscle: No Muscle: No Joint: No Joint: No Bone: No Bone: No Epithelialization: None  N/A N/A Treatment Notes Electronic Signature(s) Signed: 07/01/2020 4:27:57 PM By: Carlene Coria RN Entered By: Carlene Coria on 06/26/2020 10:29:52 [name] ([id number]) -------------------------------------------------------------------------------- [place] Details Patient Name: [name] Date of Service: 06/26/2020 9:30 AM Medical Record Number: [id number] Patient Account Number: [id number] Date of Birth/Sex: 1930/04/12 (87 y.o. M) Treating RN: Carlene Coria Primary Care Junko Ohagan: Gaynelle Arabian Other Clinician: Referring Janziel Hockett: Gaynelle Arabian Treating Malyiah Fellows/Extender: Skipper Cliche in Treatment: 2 Active Inactive Wound/Skin Impairment Nursing Diagnoses: Knowledge deficit related to ulceration/compromised skin integrity Goals: Patient/caregiver will verbalize understanding of skin care regimen Date Initiated: 06/12/2020 Target Resolution Date: 07/12/2020 Goal Status: Active Ulcer/skin breakdown will have a volume reduction of 30%  by week 4 Date Initiated: 06/12/2020 Target Resolution Date: 07/12/2020 Goal Status: Active Ulcer/skin breakdown will have a volume reduction of 50% by week 8 Date Initiated: 06/12/2020 Target Resolution Date: 08/12/2020 Goal Status: Active Ulcer/skin breakdown will have a volume reduction of 80% by week 12 Date Initiated: 06/12/2020 Target Resolution Date: 09/12/2020 Goal Status: Active Ulcer/skin breakdown will heal within 14 weeks Date Initiated: 06/12/2020 Target Resolution Date: 10/12/2020 Goal Status: Active Interventions: Assess patient/caregiver ability to obtain necessary supplies Assess patient/caregiver ability to perform ulcer/skin care regimen upon admission and as needed Assess ulceration(s) every visit Notes: Electronic Signature(s) Signed: 07/01/2020 4:27:57 PM By: Carlene Coria RN Entered By: Carlene Coria on 06/26/2020 10:29:40 [name]  ([id number]) -------------------------------------------------------------------------------- Pain Assessment Details Patient Name: [name] Date of Service: 06/26/2020 9:30 AM Medical Record Number: [id number] Patient Account Number: [id number] Date of Birth/Sex: 04/13/1930 (87 y.o. M) Treating RN: Carlene Coria Primary Care Vivyan Biggers: Gaynelle Arabian Other Clinician: Referring Voshon Petro: Gaynelle Arabian Treating Javed Cotto/Extender: Skipper Cliche in Treatment: 2 Active Problems Location of Pain Severity and Description of Pain Patient Has Paino No Site Locations Rate the pain. Current Pain Level: 0 Pain Management and Medication Current Pain Management: Electronic Signature(s) Signed: 06/30/2020 4:23:36 PM By: Jeanine Luz Signed: 07/01/2020 4:27:57 PM By: Carlene Coria RN Entered By: Jeanine Luz on 06/26/2020 10:02:22 [name] ([id number]) -------------------------------------------------------------------------------- Patient/Caregiver Education Details Patient Name: [name] Date of Service: 06/26/2020 9:30 AM Medical Record Number: [id number] Patient Account Number: [id number] Date of Birth/Gender: 1930-06-06 (87 y.o. M) Treating RN: Carlene Coria Primary Care Physician: Gaynelle Arabian Other Clinician: Referring Physician: Gaynelle Arabian Treating Physician/Extender: Skipper Cliche in Treatment: 2 Education Assessment Education Provided To: Patient Education Topics Provided Wound/Skin Impairment: Methods: Explain/Verbal Responses: State content correctly Electronic Signature(s) Signed: 07/01/2020 4:27:57 PM By: Carlene Coria RN Entered By: Carlene Coria on 06/26/2020 10:34:10 [name] ([id number]) -------------------------------------------------------------------------------- Wound Assessment Details Patient Name: [name] Date of Service: 06/26/2020 9:30 AM Medical Record Number: [id number] Patient Account Number: [id number] Date  of Birth/Sex: Jan 09, 1931 (87 y.o. M) Treating RN: Carlene Coria Primary Care Endrit Gittins: Gaynelle Arabian Other Clinician: Referring Odilia Damico: Gaynelle Arabian Treating Jerardo Costabile/Extender: Skipper Cliche in Treatment: 2 Wound Status Wound Number: 5 Primary Etiology: Venous Leg Ulcer Wound Location: Right, Lateral Lower Leg Wound Status: Open Wounding Event: Gradually Appeared Comorbid History: Cataracts, Arrhythmia, Hypertension Date Acquired: 04/29/2020 Weeks Of Treatment: 2 Clustered Wound: No Photos Wound Measurements Length: (cm) 2.8 Width: (cm) 1.8 Depth: (cm) 0.1 Area: (cm) 3.958 Volume: (cm) 0.396 % Reduction in Area: 47.3% % Reduction in Volume: 47.3% Epithelialization: None Tunneling: No Undermining: No Wound Description Classification: Full Thickness Without Exposed Support Structures Exudate Amount: Medium Exudate Type: Serosanguineous Exudate Color: red, brown Foul Odor After Cleansing: No Slough/Fibrino Yes Wound Bed Granulation Amount: Medium (34-66%) Exposed Structure Granulation Quality: Red Fascia Exposed: No Necrotic Amount: Medium (34-66%) Fat Layer (Subcutaneous Tissue) Exposed: Yes Necrotic Quality: Adherent Slough Tendon Exposed: No Muscle Exposed: No Joint Exposed: No Bone Exposed: No Treatment Notes Wound #5 (Lower Leg) Wound Laterality: Right, Lateral Cleanser Soap and Water Discharge Instruction: Gently cleanse wound with antibacterial soap, rinse and pat dry prior to dressing wounds Peri-Wound Care [name], [name] ([id number]) Topical Primary Dressing Prisma 4.34 (in) Discharge Instruction: Moisten w/normal saline or sterile water; Cover wound as directed. Do not remove from wound bed. Secondary Dressing Xtrasorb Medium 4x5 (in/in) Discharge Instruction: Apply to wound as directed. Do not cut. Secured With Compression Wrap Profore Lite LF 3  Multilayer Compression Bandaging System Discharge Instruction: Apply 3 multi-layer wrap as  prescribed. Compression Stockings Add-Ons Electronic Signature(s) Signed: 06/30/2020 4:23:36 PM By: Jeanine Luz Signed: 07/01/2020 4:27:57 PM By: Carlene Coria RN Entered By: Jeanine Luz on 06/26/2020 10:17:22 [name] ([id number]) -------------------------------------------------------------------------------- Wound Assessment Details Patient Name: [name], [name]. Date of Service: 06/26/2020 9:30 AM Medical Record Number: [id number] Patient Account Number: [id number] Date of Birth/Sex: 03-20-1930 (87 y.o. M) Treating RN: Carlene Coria Primary Care Leilanie Rauda: Gaynelle Arabian Other Clinician: Referring [place]ha Doster: Gaynelle Arabian Treating Payson Crumby/Extender: Skipper Cliche in Treatment: 2 Wound Status Wound Number: 6 Primary Etiology: Trauma, Other Wound Location: Right Elbow Wound Status: Open Wounding Event: Trauma Comorbid History: Cataracts, Arrhythmia, Hypertension Date Acquired: 06/23/2020 Weeks Of Treatment: 0 Clustered Wound: No Photos Wound Measurements Length: (cm) 5 Width: (cm) 2 Depth: (cm) 0.1 Area: (cm) 7.854 Volume: (cm) 0.785 % Reduction in Area: % Reduction in Volume: Tunneling: No Undermining: No Wound Description Classification: Partial Thickness Exudate Amount: Large Exudate Type: Serous Exudate Color: amber Foul Odor After Cleansing: No Slough/Fibrino Yes Wound Bed Granulation Amount: Medium (34-66%) Exposed Structure Granulation Quality: Pink Fascia Exposed: No Necrotic Amount: Medium (34-66%) Fat Layer (Subcutaneous Tissue) Exposed: Yes Necrotic Quality: Adherent Slough Tendon Exposed: No Muscle Exposed: No Joint Exposed: No Bone Exposed: No Treatment Notes Wound #6 (Elbow) Wound Laterality: Right Cleanser Byram Ancillary Kit - 15 Day Supply Discharge Instruction: Use supplies as instructed; Kit contains: (15) Saline Bullets; (15) 3x3 Gauze; 15 pr Gloves Normal Saline Discharge Instruction: Wash your hands with soap and  water. Remove old dressing, discard into plastic bag and place into trash. Cleanse the wound with Normal Saline prior to applying a clean dressing using gauze sponges, not tissues or cotton balls. Do not [name], [name]. ([id number]) scrub or use excessive force. Pat dry using gauze sponges, not tissue or cotton balls. Peri-Wound Care Topical Primary Dressing Xeroform 4x4-HBD (in/in) Discharge Instruction: Apply Xeroform 4x4-HBD (in/in) as directed Secondary Dressing ABD Pad 5x9 (in/in) Discharge Instruction: Cover with ABD pad Secured With Stretch Net Size 10 (yds) Discharge Instruction: To secure dressing in place. Compression Wrap Compression Stockings Add-Ons Electronic Signature(s) Signed: 06/30/2020 4:23:36 PM By: Jeanine Luz Signed: 07/01/2020 4:27:57 PM By: Carlene Coria RN Entered By: Jeanine Luz on 06/26/2020 10:16:36 [name] ([id number]) -------------------------------------------------------------------------------- Vitals Details Patient Name: [name] Date of Service: 06/26/2020 9:30 AM Medical Record Number: [id number] Patient Account Number: [id number] Date of Birth/Sex: 1930/07/26 (87 y.o. M) Treating RN: Carlene Coria Primary Care Amir Fick: Gaynelle Arabian Other Clinician: Referring Maryna Yeagle: Gaynelle Arabian Treating Alesi Zachery/Extender: Skipper Cliche in Treatment: 2 Vital Signs Time Taken: 09:55 Temperature (F): 98.0 Height (in): 71 Pulse (bpm): 93 Weight (lbs): 209 Respiratory Rate (breaths/min): 20 Body Mass Index (BMI): 29.1 Blood Pressure (mmHg): 162/97 Reference Range: 80 - 120 mg / dl Electronic Signature(s) Signed: 06/30/2020 4:23:36 PM By: Jeanine Luz Entered By: Jeanine Luz on 06/26/2020 10:02:06

## 2020-07-02 DIAGNOSIS — J22 Unspecified acute lower respiratory infection: Secondary | ICD-10-CM | POA: Diagnosis not present

## 2020-07-02 DIAGNOSIS — L97919 Non-pressure chronic ulcer of unspecified part of right lower leg with unspecified severity: Secondary | ICD-10-CM | POA: Diagnosis not present

## 2020-07-02 DIAGNOSIS — M7989 Other specified soft tissue disorders: Secondary | ICD-10-CM | POA: Diagnosis not present

## 2020-07-03 ENCOUNTER — Encounter: Payer: Medicare Other | Admitting: Physician Assistant

## 2020-07-03 ENCOUNTER — Other Ambulatory Visit: Payer: Self-pay

## 2020-07-03 DIAGNOSIS — I890 Lymphedema, not elsewhere classified: Secondary | ICD-10-CM | POA: Diagnosis not present

## 2020-07-03 DIAGNOSIS — L97512 Non-pressure chronic ulcer of other part of right foot with fat layer exposed: Secondary | ICD-10-CM | POA: Diagnosis not present

## 2020-07-03 NOTE — Progress Notes (Addendum)
LORNE, WINKELS ([id number]) Visit Report for 07/03/2020 Chief Complaint Document Details Patient Name: [name]. Date of Service: 07/03/2020 9:15 AM Medical Record Number: [id number] Patient Account Number: [id number] Date of Birth/Sex: Apr 14, 1930 (87 y.o. M) Treating RN: Carlene Coria Primary Care Provider: Gaynelle Arabian Other Clinician: Referring Provider: Gaynelle Arabian Treating Provider/Extender: Skipper Cliche in Treatment: 3 Information Obtained from: Patient Chief Complaint Right LE Ulcers Electronic Signature(s) Signed: 07/03/2020 9:34:29 AM By: Worthy Keeler PA-C Entered By: Worthy Keeler on 07/03/2020 09:34:28 [name] ([id number]) -------------------------------------------------------------------------------- HPI Details Patient Name: [name] Date of Service: 07/03/2020 9:15 AM Medical Record Number: [id number] Patient Account Number: [id number] Date of Birth/Sex: 1930/03/15 (87 y.o. M) Treating RN: Carlene Coria Primary Care Provider: Gaynelle Arabian Other Clinician: Referring Provider: Gaynelle Arabian Treating Provider/Extender: Skipper Cliche in Treatment: 3 History of Present Illness HPI Description: 87 year old gentleman has been referred to [place] by his PCP Dr. Gaynelle Arabian, for a ulcer on the left posterior lower leg which has been there for about 6 weeks. The patient was thought to have an infection and was put on doxycycline for 14 days and referred to the wound center. He has a past medical history of paroxysmal atrial fibrillation, hypertension, diverticulosis, status post right hip replacement, appendectomy. He has not been a smoker since 43. the anterior shin wound has been more recent about 2 weeks and this was caused probably due to a blunt abrasion. 03/06/17 on evaluation today patient's ulcer appears to show signs of excellent improvement. He is definitely showing additional evidence of granulation which is great news. Overall  I'm extremely pleased with the progress that he has been making from the beginning to where we are right now. With that being said I would like to potentially try different dressing to see if we could stimulate this to heal even faster. 03/13/17 on evaluation today patient appears to be doing little better in regard to his leg ulcer at this time. He has been tolerating the dressing changes without complication. There does not appear to be any evidence of infection which is good news. He does have some additional granulation observed at this point and I do believe that the silver collagen dressing has been of benefit in this regard. Overall I'm very pleased with the progress she's made since last visit the only downside is that he does have additional maceration due to drainage compared to previous we may need to do something to address this. 03/27/17 on evaluation today patient appears to be doing well in regard to his left posterior lower surety ulcer. He has a significant area of epithelial growth which seems to be doing excellent at this point in time. He also seems to be well controlled as far as slough is concerned and drainage there is no evidence of maceration and there really was no slough that could not be cleaned off with saline and gauze. Overall patient seems to be making excellent progress. 04/10/17 on evaluation today patient appears to be doing better in regard to the original ulcer for which we have been treating him. Unfortunately he has two areas where he sustained injury to his posterior leg more proximal to where the current ulceration is. This was due to it appears to be tape and adhesive at this point. When he was using the Allevyn dressing there was no problems however they ran out of these and since that point there seems to have been more irritation around the wound from the  Band-Aids that his wife has been using in their place. Other than this which appear to be very superficial  new ulcers everything appears to be progressing nicely. 04/24/17 on evaluation today patient appears to be doing excellent in regard to his left posterior lower extremity ulcer. He is been tolerating the dressing changes without complication fortunately there does not appear to be any evidence of infection at this point in time. Overall he has made significant improvements since I last saw him. 05/08/17 on evaluation today patient's wound appears to be completely healed at this point. Fortunately this is great news and though it is closed over with epithelium there still some healing to be done underneath for this completely toughen up this was explained to the patient and his wife today as well. Readmission: 06/12/2020 this is a gentleman whom I have previously seen here in the clinic before in regards to similar issues with wounds on his lower extremities. 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 He is also very swollen the skin is tense and even shiny due to the fluid buildup. Obviously I think this is definitely something that we need to address sooner rather than later as far as that is concerned. Fortunately there does not appear to be any signs of active infection he is on a blood thinner, Coumadin, this is due to chronic atrial fibrillation. 06/19/2020 upon evaluation today patient's wound is actually showing signs of significant improvement. I am actually extremely pleased with where things stand he is going require little bit of debridement to clear away some of the necrotic debris on the surface of the wound he actually is okay with that he is on Coumadin selective do this carefully but nonetheless I think it is definitely achievable.. 06/26/2020 upon evaluation today patient appears to be doing well in regard to his leg ulcer. I feel like he is showing  signs of improvement and overall very pleased in this regard. Unfortunately he did have a fall where he injured his right elbow this is new and subsequently does need to be addressed as well with dressings today. Fortunately it does not appear to be too bad. 07/03/2020 upon evaluation today patient appears to be doing poorly upon inspection today. In fact we did not even get a look at his wounds because he was not acting himself at all. He was very lethargic leading to the left when he came in in his wheelchair. He did not seem to be extremely responsive which also have me very concerned. His family member who is with him states this has been going on for the last day. With that being said he is not exactly sure what is going on either. Overall the patient does not appear to be in very good shape compared to what I have previously noted. I am not exactly sure what the issue may be but I think he probably needs to be evaluated at the ER to be honest. Electronic Signature(s) Signed: 07/03/2020 6:03:33 PM By: Worthy Keeler PA-C Entered By: Worthy Keeler on 07/03/2020 18:03:33 [name] ([id number]) -------------------------------------------------------------------------------- Physical Exam Details Patient Name: [name] Date of Service: 07/03/2020 9:15 AM Medical Record Number: [id number] Patient Account Number: [id number] Date of Birth/Sex: 1931-01-08 (87 y.o. M) Treating RN: Carlene Coria Primary Care Provider: Gaynelle Arabian Other Clinician: Referring Provider: Gaynelle Arabian Treating  Provider/Extender: Jeri Cos Weeks in Treatment: 3 Constitutional Well-nourished and well-hydrated in no acute distress. Respiratory normal breathing without difficulty. Psychiatric Patient is not able to cooperate in decision making regarding care. Patient is oriented to person only. pleasant and cooperative. Notes Again the patient really was not evaluated today from the standpoint of his  wounds due to the fact that in general he did not appear to be doing too well he was very lethargic and though he does have some memory loss it was difficult to tell if anything was going on in that regard. I do not believe this is a stroke he seem to have equal though weak grips bilaterally. His eyes were also moving equally and his eyebrows raise seem to be equal. Nonetheless something does not appear to be doing quite right ear today. Electronic Signature(s) Signed: 07/03/2020 6:04:46 PM By: Worthy Keeler PA-C Entered By: Worthy Keeler on 07/03/2020 18:04:46 [name] ([id number]) -------------------------------------------------------------------------------- Problem List Details Patient Name: MANCE, VALLEJO Date of Service: 07/03/2020 9:15 AM Medical Record Number: [id number] Patient Account Number: [id number] Date of Birth/Sex: Nov 05, 1930 (87 y.o. M) Treating RN: Carlene Coria Primary Care Provider: Gaynelle Arabian Other Clinician: Referring Provider: Gaynelle Arabian Treating Provider/Extender: Skipper Cliche in Treatment: 3 Active Problems ICD-10 Encounter Code Description Active Date MDM Diagnosis I87.2 Venous insufficiency (chronic) (peripheral) 06/12/2020 No Yes I89.0 Lymphedema, not elsewhere classified 06/12/2020 No Yes L97.812 Non-pressure chronic ulcer of other part of right lower leg with fat layer 06/12/2020 No Yes exposed S51.011A Laceration without foreign body of right elbow, initial encounter 06/26/2020 No Yes I48.0 Paroxysmal atrial fibrillation 06/12/2020 No Yes Inactive Problems Resolved Problems Electronic Signature(s) Signed: 07/03/2020 9:34:22 AM By: Worthy Keeler PA-C Entered By: Worthy Keeler on 07/03/2020 09:34:22 [name] ([id number]) -------------------------------------------------------------------------------- Progress Note Details Patient Name: [name] Date of Service: 07/03/2020 9:15 AM Medical Record Number:  [id number] Patient Account Number: [id number] Date of Birth/Sex: [birth year] (87 y.o. M) Treating RN: Carlene Coria Primary Care Provider: Gaynelle Arabian Other Clinician: Referring Provider: Gaynelle Arabian Treating Provider/Extender: Skipper Cliche in Treatment: 3 Subjective Chief Complaint Information obtained from Patient Right LE Ulcers History of Present Illness (HPI) 87 year old gentleman has been referred to [place] by his PCP Dr. Gaynelle Arabian, for a ulcer on the left posterior lower leg which has been there for about 6 weeks. The patient was thought to have an infection and was put on doxycycline for 14 days and referred to the wound center. He has a past medical history of paroxysmal atrial fibrillation, hypertension, diverticulosis, status post right hip replacement, appendectomy. He has not been a smoker since 65. the anterior shin wound has been more recent about 2 weeks and this was caused probably due to a blunt abrasion. 03/06/17 on evaluation today patient's ulcer appears to show signs of excellent improvement. He is definitely showing additional evidence of granulation which is great news. Overall I'm extremely pleased with the progress that he has been making from the beginning to where we are right now. With that being said I would like to potentially try different dressing to see if we could stimulate this to heal even faster. 03/13/17 on evaluation today patient appears to be doing little better in regard to his leg ulcer at this time. He has been tolerating the dressing changes without complication. There does not appear to be any evidence of infection which is good news. He does have some additional granulation observed at this point and I  do believe that the silver collagen dressing has been of benefit in this regard. Overall I'm very pleased with the progress she's made since last visit the only downside is that he does have additional maceration due to drainage compared  to previous we may need to do something to address this. 03/27/17 on evaluation today patient appears to be doing well in regard to his left posterior lower surety ulcer. He has a significant area of epithelial growth which seems to be doing excellent at this point in time. He also seems to be well controlled as far as slough is concerned and drainage there is no evidence of maceration and there really was no slough that could not be cleaned off with saline and gauze. Overall patient seems to be making excellent progress. 04/10/17 on evaluation today patient appears to be doing better in regard to the original ulcer for which we have been treating him. Unfortunately he has two areas where he sustained injury to his posterior leg more proximal to where the current ulceration is. This was due to it appears to be tape and adhesive at this point. When he was using the Allevyn dressing there was no problems however they ran out of these and since that point there seems to have been more irritation around the wound from the Band-Aids that his wife has been using in their place. Other than this which appear to be very superficial new ulcers everything appears to be progressing nicely. 04/24/17 on evaluation today patient appears to be doing excellent in regard to his left posterior lower extremity ulcer. He is been tolerating the dressing changes without complication fortunately there does not appear to be any evidence of infection at this point in time. Overall he has made significant improvements since I last saw him. 05/08/17 on evaluation today patient's wound appears to be completely healed at this point. Fortunately this is great news and though it is closed over with epithelium there still some healing to be done underneath for this completely toughen up this was explained to the patient and his wife today as well. Readmission: 06/12/2020 this is a gentleman whom I have previously seen here in the clinic  before in regards to similar issues with wounds on his lower extremities. 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 He is also very swollen the skin is tense and even shiny due to the fluid buildup. Obviously I think this is definitely something that we need to address sooner rather than later as far as that is concerned. Fortunately there does not appear to be any signs of active infection he is on a blood thinner, Coumadin, this is due to chronic atrial fibrillation. 06/19/2020 upon evaluation today patient's wound is actually showing signs of significant improvement. I am actually extremely pleased with where things stand he is going require little bit of debridement to clear away some of the necrotic debris on the surface of the wound he actually is okay with that he is on Coumadin selective do this carefully but nonetheless I think it is definitely achievable.. 06/26/2020 upon evaluation today patient appears to be doing well in regard to his leg ulcer. I feel like he is showing signs of improvement and overall very pleased in this regard. Unfortunately he did have a fall where he injured his right elbow this is new and subsequently  does need to be addressed as well with dressings today. Fortunately it does not appear to be too bad. 07/03/2020 upon evaluation today patient appears to be doing poorly upon inspection today. In fact we did not even get a look at his wounds because he was not acting himself at all. He was very lethargic leading to the left when he came in in his wheelchair. He did not seem to be extremely responsive which also have me very concerned. His family member who is with him states this has been going on for the last day. With that being said he is not exactly sure what is going on either. Overall the patient does not appear to be in very  good shape compared to what I have previously noted. I am not exactly sure what the issue may be but I think he probably needs to be evaluated at the ER to be honest. [name], [name]. ([id number]) Objective Constitutional Well-nourished and well-hydrated in no acute distress. Respiratory normal breathing without difficulty. Psychiatric Patient is not able to cooperate in decision making regarding care. Patient is oriented to person only. pleasant and cooperative. General Notes: Again the patient really was not evaluated today from the standpoint of his wounds due to the fact that in general he did not appear to be doing too well he was very lethargic and though he does have some memory loss it was difficult to tell if anything was going on in that regard. I do not believe this is a stroke he seem to have equal though weak grips bilaterally. His eyes were also moving equally and his eyebrows raise seem to be equal. Nonetheless something does not appear to be doing quite right ear today. Assessment Active Problems ICD-10 Venous insufficiency (chronic) (peripheral) Lymphedema, not elsewhere classified Non-pressure chronic ulcer of other part of right lower leg with fat layer exposed Laceration without foreign body of right elbow, initial encounter Paroxysmal atrial fibrillation Plan 1. We did go ahead and have the patient go to the ER for further evaluation and treatment. I felt like this was the safest and best thing to do especially considering the fact that we did not really feel like he was himself today upon evaluation. I am not certain which ER they were going to go to. I did look into the system here at [facility] and I do not see any evidence that they have gone so far. The only notes and there are those from me from earlier in the clinic. Either way I am not certain exactly what is going on with that regard but I did feel like the patient needed more immediate evaluation to ensure there was  nothing more significant going on. 2. I am also can recommend that the patient follow-up with [place] once he is out of the ER for further wound care measures and recommendations. We will see him back after the ER evaluation. Electronic Signature(s) Signed: 07/03/2020 6:05:54 PM By: Worthy Keeler PA-C Entered By: Worthy Keeler on 07/03/2020 18:05:54 [name] ([id number]) -------------------------------------------------------------------------------- SuperBill Details Patient Name: [name] Date of Service: 07/03/2020 Medical Record Number: [id number] Patient Account Number: [id number] Date of Birth/Sex: 04/17/1930 (87 y.o. M) Treating RN: Carlene Coria Primary Care Provider: Gaynelle Arabian Other Clinician: Referring Provider: Gaynelle Arabian Treating Provider/Extender: Skipper Cliche in Treatment: 3 Diagnosis Coding ICD-10 Codes Code Description I87.2 Venous insufficiency (chronic) (peripheral) I89.0 Lymphedema, not elsewhere classified L97.812 Non-pressure chronic ulcer of other part of right lower leg  with fat layer exposed S51.011A Laceration without foreign body of right elbow, initial encounter I48.0 Paroxysmal atrial fibrillation Physician Procedures CPT4 Code: [id number] Description: 01751 - WC PHYS LEVEL 4 - EST PT Modifier: Quantity: 1 CPT4 Code: Description: ICD-10 Diagnosis Description I87.2 Venous insufficiency (chronic) (peripheral) I89.0 Lymphedema, not elsewhere classified W25.852 Non-pressure chronic ulcer of other part of right lower leg with fat la S51.011A Laceration without foreign  body of right elbow, initial encounter Modifier: yer exposed Quantity: Electronic Signature(s) Signed: 07/03/2020 6:06:05 PM By: Worthy Keeler PA-C Entered By: Worthy Keeler on 07/03/2020 18:06:05

## 2020-07-04 ENCOUNTER — Other Ambulatory Visit: Payer: Self-pay | Admitting: Interventional Cardiology

## 2020-07-10 ENCOUNTER — Ambulatory Visit: Payer: Medicare Other | Admitting: Physician Assistant

## 2020-07-17 ENCOUNTER — Other Ambulatory Visit: Payer: Self-pay

## 2020-07-17 ENCOUNTER — Encounter: Payer: Medicare Other | Attending: Physician Assistant | Admitting: Physician Assistant

## 2020-07-17 DIAGNOSIS — I890 Lymphedema, not elsewhere classified: Secondary | ICD-10-CM | POA: Insufficient documentation

## 2020-07-17 DIAGNOSIS — I872 Venous insufficiency (chronic) (peripheral): Secondary | ICD-10-CM | POA: Insufficient documentation

## 2020-07-17 DIAGNOSIS — Z96641 Presence of right artificial hip joint: Secondary | ICD-10-CM | POA: Diagnosis not present

## 2020-07-17 DIAGNOSIS — W19XXXA Unspecified fall, initial encounter: Secondary | ICD-10-CM | POA: Diagnosis not present

## 2020-07-17 DIAGNOSIS — L97812 Non-pressure chronic ulcer of other part of right lower leg with fat layer exposed: Secondary | ICD-10-CM | POA: Insufficient documentation

## 2020-07-17 DIAGNOSIS — L98492 Non-pressure chronic ulcer of skin of other sites with fat layer exposed: Secondary | ICD-10-CM | POA: Diagnosis not present

## 2020-07-17 DIAGNOSIS — S51011A Laceration without foreign body of right elbow, initial encounter: Secondary | ICD-10-CM | POA: Diagnosis not present

## 2020-07-17 DIAGNOSIS — F17200 Nicotine dependence, unspecified, uncomplicated: Secondary | ICD-10-CM | POA: Diagnosis not present

## 2020-07-17 DIAGNOSIS — I480 Paroxysmal atrial fibrillation: Secondary | ICD-10-CM | POA: Diagnosis not present

## 2020-07-17 DIAGNOSIS — I10 Essential (primary) hypertension: Secondary | ICD-10-CM | POA: Diagnosis not present

## 2020-07-17 NOTE — Progress Notes (Addendum)
LOY, MCCARTT ([id number]) Visit Report for 07/17/2020 Chief Complaint Document Details Patient Name: [name], [name]. Date of Service: 07/17/2020 11:15 AM Medical Record Number: [id number] Patient Account Number: [id number] Date of Birth/Sex: Jan 07, 1931 (87 y.o. M) Treating RN: Carlene Coria Primary Care Provider: Gaynelle Arabian Other Clinician: Referring Provider: Gaynelle Arabian Treating Provider/Extender: Skipper Cliche in Treatment: 5 Information Obtained from: Patient Chief Complaint Right LE Ulcers Electronic Signature(s) Signed: 07/17/2020 10:54:56 AM By: Worthy Keeler PA-C Entered By: Worthy Keeler on 07/17/2020 10:54:56 [name] ([id number]) -------------------------------------------------------------------------------- Debridement Details Patient Name: [name] Date of Service: 07/17/2020 11:15 AM Medical Record Number: [id number] Patient Account Number: [id number] Date of Birth/Sex: [birth year] (87 y.o. M) Treating RN: Donnamarie Poag Primary Care Provider: Gaynelle Arabian Other Clinician: Referring Provider: Gaynelle Arabian Treating Provider/Extender: Skipper Cliche in Treatment: 5 Debridement Performed for Wound #6 Right Elbow Assessment: Performed By: Physician Tommie Sams., PA-C Debridement Type: Debridement Level of Consciousness (Pre- Awake and Alert procedure): Pre-procedure Verification/Time Out Yes - 11:43 Taken: Start Time: 11:43 Total Area Debrided (L x W): 0.3 (cm) x 0.3 (cm) = 0.09 (cm) Tissue and other material Viable, Non-Viable, Eschar, Skin: Dermis debrided: Level: Skin/Dermis Debridement Description: Selective/Open Wound Instrument: Curette Bleeding: Minimum Hemostasis Achieved: Pressure Response to Treatment: Procedure was tolerated well Level of Consciousness (Post- Awake and Alert procedure): Post Debridement Measurements of Total Wound Length: (cm) 0.3 Width: (cm) 0.3 Depth: (cm) 0.1 Volume: (cm) 0.007 Character  of Wound/Ulcer Post Debridement: Improved Post Procedure Diagnosis Same as Pre-procedure Electronic Signature(s) Signed: 07/17/2020 3:14:45 PM By: Donnamarie Poag Signed: 07/17/2020 5:03:48 PM By: Worthy Keeler PA-C Entered By: Donnamarie Poag on 07/17/2020 11:44:07 [name] ([id number]) -------------------------------------------------------------------------------- HPI Details Patient Name: [name] Date of Service: 07/17/2020 11:15 AM Medical Record Number: [id number] Patient Account Number: [id number] Date of Birth/Sex: [birth year] (87 y.o. M) Treating RN: Carlene Coria Primary Care Provider: Gaynelle Arabian Other Clinician: Referring Provider: Gaynelle Arabian Treating Provider/Extender: Skipper Cliche in Treatment: 5 History of Present Illness HPI Description: 87 year old gentleman has been referred to [place] by his PCP Dr. Gaynelle Arabian, for a ulcer on the left posterior lower leg which has been there for about 6 weeks. The patient was thought to have an infection and was put on doxycycline for 14 days and referred to the wound center. He has a past medical history of paroxysmal atrial fibrillation, hypertension, diverticulosis, status post right hip replacement, appendectomy. He has not been a smoker since 28. the anterior shin wound has been more recent about 2 weeks and this was caused probably due to a blunt abrasion. 03/06/17 on evaluation today patient's ulcer appears to show signs of excellent improvement. He is definitely showing additional evidence of granulation which is great news. Overall I'm extremely pleased with the progress that he has been making from the beginning to where we are right now. With that being said I would like to potentially try different dressing to see if we could stimulate this to heal even faster. 03/13/17 on evaluation today patient appears to be doing little better in regard to his leg ulcer at this time. He has been tolerating the dressing changes  without complication. There does not appear to be any evidence of infection which is good news. He does have some additional granulation observed at this point and I do believe that the silver collagen dressing has been of benefit in this regard. Overall I'm very pleased with the progress she's made since last  visit the only downside is that he does have additional maceration due to drainage compared to previous we may need to do something to address this. 03/27/17 on evaluation today patient appears to be doing well in regard to his left posterior lower surety ulcer. He has a significant area of epithelial growth which seems to be doing excellent at this point in time. He also seems to be well controlled as far as slough is concerned and drainage there is no evidence of maceration and there really was no slough that could not be cleaned off with saline and gauze. Overall patient seems to be making excellent progress. 04/10/17 on evaluation today patient appears to be doing better in regard to the original ulcer for which we have been treating him. Unfortunately he has two areas where he sustained injury to his posterior leg more proximal to where the current ulceration is. This was due to it appears to be tape and adhesive at this point. When he was using the Allevyn dressing there was no problems however they ran out of these and since that point there seems to have been more irritation around the wound from the Band-Aids that his wife has been using in their place. Other than this which appear to be very superficial new ulcers everything appears to be progressing nicely. 04/24/17 on evaluation today patient appears to be doing excellent in regard to his left posterior lower extremity ulcer. He is been tolerating the dressing changes without complication fortunately there does not appear to be any evidence of infection at this point in time. Overall he has made significant improvements since I last saw  him. 05/08/17 on evaluation today patient's wound appears to be completely healed at this point. Fortunately this is great news and though it is closed over with epithelium there still some healing to be done underneath for this completely toughen up this was explained to the patient and his wife today as well. Readmission: 06/12/2020 this is a gentleman whom I have previously seen here in the clinic before in regards to similar issues with wounds on his lower extremities. 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 He is also very swollen the skin is tense and even shiny due to the fluid buildup. Obviously I think this is definitely something that we need to address sooner rather than later as far as that is concerned. Fortunately there does not appear to be any signs of active infection he is on a blood thinner, Coumadin, this is due to chronic atrial fibrillation. 06/19/2020 upon evaluation today patient's wound is actually showing signs of significant improvement. I am actually extremely pleased with where things stand he is going require little bit of debridement to clear away some of the necrotic debris on the surface of the wound he actually is okay with that he is on Coumadin selective do this carefully but nonetheless I think it is definitely achievable.. 06/26/2020 upon evaluation today patient appears to be doing well in regard to his leg ulcer. I feel like he is showing signs of improvement and overall very pleased in this regard. Unfortunately he did have a fall where he injured his right elbow this is new and subsequently does need to be addressed as well with dressings today. Fortunately it does not appear to be too bad. 07/03/2020 upon evaluation today patient appears to  be doing poorly upon inspection today. In fact we did not even get a look at his  wounds because he was not acting himself at all. He was very lethargic leading to the left when he came in in his wheelchair. He did not seem to be extremely responsive which also have me very concerned. His family member who is with him states this has been going on for the last day. With that being said he is not exactly sure what is going on either. Overall the patient does not appear to be in very good shape compared to what I have previously noted. I am not exactly sure what the issue may be but I think he probably needs to be evaluated at the ER to be honest. 07/17/2020 upon evaluation today patient's wound is actually showing signs of good improvement. Is a little bit more swollen than it was previous but again I think this is secondary to the fact to be honest that he has not had the wrap on for the past week but nonetheless the wound seems to be doing great even so. He is able to get around and walk a little bit better today which is great news as well. I am very pleased in that regard. Nonetheless I do believe that the patient is going to have to go back into the compression wrap I think he was doing a lot better as far as the healing of the wound when he was wrapped. His family members in agreement with that plan today as well. TRANQUILINO, FISCHLER ([id number]) Electronic Signature(s) Signed: 07/17/2020 11:56:46 AM By: Worthy Keeler PA-C Entered By: Worthy Keeler on 07/17/2020 11:56:46 [name] ([id number]) -------------------------------------------------------------------------------- Physical Exam Details Patient Name: JANTHONY, [name] Date of Service: 07/17/2020 11:15 AM Medical Record Number: [id number] Patient Account Number: [id number] Date of Birth/Sex: 18-Jan-1930 (87 y.o. M) Treating RN: Carlene Coria Primary Care Provider: Gaynelle Arabian Other Clinician: Referring Provider: Gaynelle Arabian Treating Provider/Extender: Skipper Cliche in Treatment:  5 Constitutional Well-nourished and well-hydrated in no acute distress. Respiratory normal breathing without difficulty. Psychiatric this patient is able to make decisions and demonstrates good insight into disease process. Alert and Oriented x 3. pleasant and cooperative. Notes Upon inspection patient's wound bed actually showed signs of good granulation epithelization at this point. No sharp debridement was necessary today which is great news mechanically I was able to clear this away with saline and gauze. Overall the patient seems to be doing a great job as far as getting around compared to last week he was very weak and out of it last week again apparently this was due to a sinus and upper respiratory issue. Electronic Signature(s) Signed: 07/17/2020 11:57:12 AM By: Worthy Keeler PA-C Entered By: Worthy Keeler on 07/17/2020 11:57:12 [name] ([id number]) -------------------------------------------------------------------------------- Physician Orders Details Patient Name: [name] Date of Service: 07/17/2020 11:15 AM Medical Record Number: [id number] Patient Account Number: [id number] Date of Birth/Sex: November 25, 1930 (87 y.o. M) Treating RN: Donnamarie Poag Primary Care Provider: Gaynelle Arabian Other Clinician: Referring Provider: Gaynelle Arabian Treating Provider/Extender: Skipper Cliche in Treatment: 5 Verbal / Phone Orders: No Diagnosis Coding ICD-10 Coding Code Description I87.2 Venous insufficiency (chronic) (peripheral) I89.0 Lymphedema, not elsewhere classified L97.812 Non-pressure chronic ulcer of other part of right lower leg with fat layer exposed S51.011A Laceration without foreign body of right elbow, initial encounter I48.0 Paroxysmal atrial fibrillation Follow-up Appointments o Return Appointment in 1 week. Bathing/ Shower/ Hygiene o May  shower with wound dressing protected with water repellent cover or cast protector. Edema Control - Lymphedema  / Segmental Compressive Device / Other o Optional: One layer of unna paste to top of compression wrap (to act as an anchor). o Elevate, Exercise Daily and Avoid Standing for Long Periods of Time. o Elevate legs to the level of the heart and pump ankles as often as possible o Elevate leg(s) parallel to the floor when sitting. Wound Treatment Wound #5 - Lower Leg Wound Laterality: Right, Lateral Cleanser: Byram Ancillary Kit - 15 Day Supply (Generic) 1 x Per Week Discharge Instructions: Use supplies as instructed; Kit contains: (15) Saline Bullets; (15) 3x3 Gauze; 15 pr Gloves Cleanser: Soap and Water 1 x Per Week Discharge Instructions: Gently cleanse wound with antibacterial soap, rinse and pat dry prior to dressing wounds Primary Dressing: Prisma 4.34 (in) (Generic) 1 x Per Week Discharge Instructions: Moisten w/normal saline or sterile water; Cover wound as directed. Do not remove from wound bed. Secondary Dressing: ABD Pad 5x9 (in/in) 1 x Per Week Discharge Instructions: Cover with ABD pad Compression Wrap: Profore Lite LF 3 Multilayer Compression Bandaging System 1 x Per Week Discharge Instructions: Apply 3 multi-layer wrap as prescribed. Wound #6 - Elbow Wound Laterality: Right Cleanser: Byram Ancillary Kit - 15 Day Supply (Generic) 1 x Per Day/30 Days Discharge Instructions: Use supplies as instructed; Kit contains: (15) Saline Bullets; (15) 3x3 Gauze; 15 pr Gloves Cleanser: Normal Saline 1 x Per Day/30 Days Discharge Instructions: Wash your hands with soap and water. Remove old dressing, discard into plastic bag and place into trash. Cleanse the wound with Normal Saline prior to applying a clean dressing using gauze sponges, not tissues or cotton balls. Do not scrub or use excessive force. Pat dry using gauze sponges, not tissue or cotton balls. Primary Dressing: Xeroform 4x4-HBD (in/in) (Generic) 1 x Per Day/30 Days Discharge Instructions: Apply Xeroform 4x4-HBD (in/in) as  directed Secondary Dressing: ABD Pad 5x9 (in/in) (Generic) 1 x Per Day/30 Days Discharge Instructions: Cover with ABD pad Secured With: Stretch Net Size 10 (yds) 1 x Per Day/30 Days ANSEN, SAYEGH ([id number]) Discharge Instructions: To secure dressing in place. Electronic Signature(s) Signed: 07/17/2020 3:14:45 PM By: Donnamarie Poag Signed: 07/17/2020 5:03:48 PM By: Worthy Keeler PA-C Entered By: Donnamarie Poag on 07/17/2020 11:44:59 [name] ([id number]) -------------------------------------------------------------------------------- Problem List Details Patient Name: VIRGINIA, FRANCISCO. Date of Service: 07/17/2020 11:15 AM Medical Record Number: [id number] Patient Account Number: [id number] Date of Birth/Sex: May 16, 1930 (87 y.o. M) Treating RN: Carlene Coria Primary Care Provider: Gaynelle Arabian Other Clinician: Referring Provider: Gaynelle Arabian Treating Provider/Extender: Skipper Cliche in Treatment: 5 Active Problems ICD-10 Encounter Code Description Active Date MDM Diagnosis I87.2 Venous insufficiency (chronic) (peripheral) 06/12/2020 No Yes I89.0 Lymphedema, not elsewhere classified 06/12/2020 No Yes L97.812 Non-pressure chronic ulcer of other part of right lower leg with fat layer 06/12/2020 No Yes exposed S51.011A Laceration without foreign body of right elbow, initial encounter 06/26/2020 No Yes I48.0 Paroxysmal atrial fibrillation 06/12/2020 No Yes Inactive Problems Resolved Problems Electronic Signature(s) Signed: 07/17/2020 10:54:51 AM By: Worthy Keeler PA-C Entered By: Worthy Keeler on 07/17/2020 10:54:50 [name] ([id number]) -------------------------------------------------------------------------------- Progress Note Details Patient Name: [name] Date of Service: 07/17/2020 11:15 AM Medical Record Number: [id number] Patient Account Number: [id number] Date of Birth/Sex: 06/19/1930 (87 y.o. M) Treating RN: Carlene Coria Primary Care Provider: Gaynelle Arabian  Other Clinician: Referring Provider: Gaynelle Arabian Treating Provider/Extender: Skipper Cliche in Treatment: 5 Subjective  Chief Complaint Information obtained from Patient Right LE Ulcers History of Present Illness (HPI) 87 year old gentleman has been referred to [place] by his PCP Dr. Gaynelle Arabian, for a ulcer on the left posterior lower leg which has been there for about 6 weeks. The patient was thought to have an infection and was put on doxycycline for 14 days and referred to the wound center. He has a past medical history of paroxysmal atrial fibrillation, hypertension, diverticulosis, status post right hip replacement, appendectomy. He has not been a smoker since 52. the anterior shin wound has been more recent about 2 weeks and this was caused probably due to a blunt abrasion. 03/06/17 on evaluation today patient's ulcer appears to show signs of excellent improvement. He is definitely showing additional evidence of granulation which is great news. Overall I'm extremely pleased with the progress that he has been making from the beginning to where we are right now. With that being said I would like to potentially try different dressing to see if we could stimulate this to heal even faster. 03/13/17 on evaluation today patient appears to be doing little better in regard to his leg ulcer at this time. He has been tolerating the dressing changes without complication. There does not appear to be any evidence of infection which is good news. He does have some additional granulation observed at this point and I do believe that the silver collagen dressing has been of benefit in this regard. Overall I'm very pleased with the progress she's made since last visit the only downside is that he does have additional maceration due to drainage compared to previous we may need to do something to address this. 03/27/17 on evaluation today patient appears to be doing well in regard to his left posterior  lower surety ulcer. He has a significant area of epithelial growth which seems to be doing excellent at this point in time. He also seems to be well controlled as far as slough is concerned and drainage there is no evidence of maceration and there really was no slough that could not be cleaned off with saline and gauze. Overall patient seems to be making excellent progress. 04/10/17 on evaluation today patient appears to be doing better in regard to the original ulcer for which we have been treating him. Unfortunately he has two areas where he sustained injury to his posterior leg more proximal to where the current ulceration is. This was due to it appears to be tape and adhesive at this point. When he was using the Allevyn dressing there was no problems however they ran out of these and since that point there seems to have been more irritation around the wound from the Band-Aids that his wife has been using in their place. Other than this which appear to be very superficial new ulcers everything appears to be progressing nicely. 04/24/17 on evaluation today patient appears to be doing excellent in regard to his left posterior lower extremity ulcer. He is been tolerating the dressing changes without complication fortunately there does not appear to be any evidence of infection at this point in time. Overall he has made significant improvements since I last saw him. 05/08/17 on evaluation today patient's wound appears to be completely healed at this point. Fortunately this is great news and though it is closed over with epithelium there still some healing to be done underneath for this completely toughen up this was explained to the patient and his wife today as well. Readmission: 06/12/2020 this  is a gentleman whom I have previously seen here in the clinic before in regards to similar issues with wounds on his lower extremities. With that being said he presents today with a wound that appears to be a  venous leg ulcer secondary to his chronic venous stasis and lymphedema over the right lateral lower extremity. He is also very swollen the skin is tense and even shiny due to the fluid buildup. Obviously I think this is definitely something that we need to address sooner rather than later as far as that is concerned. Fortunately there does not appear to be any signs of active infection he is on a blood thinner, Coumadin, this is due to chronic atrial fibrillation. 06/19/2020 upon evaluation today patient's wound is actually showing signs of significant improvement. I am actually extremely pleased with where things stand he is going require little bit of debridement to clear away some of the necrotic debris on the surface of the wound he actually is okay with that he is on Coumadin selective do this carefully but nonetheless I think it is definitely achievable.. 06/26/2020 upon evaluation today patient appears to be doing well in regard to his leg ulcer. I feel like he is showing signs of improvement and overall very pleased in this regard. Unfortunately he did have a fall where he injured his right elbow this is new and subsequently does need to be addressed as well with dressings today. Fortunately it does not appear to be too bad. 07/03/2020 upon evaluation today patient appears to be doing poorly upon inspection today. In fact we did not even get a look at his wounds because he was not acting himself at all. He was very lethargic leading to the left when he came in in his wheelchair. He did not seem to be extremely responsive which also have me very concerned. His family member who is with him states this has been going on for the last day. With that being said he is not exactly sure what is going on either. Overall the patient does not appear to be in very good shape compared to what I have previously noted. I am not exactly sure what the issue may be but I think he probably needs to be evaluated at  the ER to be honest. 07/17/2020 upon evaluation today patient's wound is actually showing signs of good improvement. Is a little bit more swollen than it was previous but again I think this is secondary to the fact to be honest that he has not had the wrap on for the past week but nonetheless the wound seems to be doing great even so. He is able to get around and walk a little bit better today which is great news as well. I am very pleased in that regard. TRENDEN, HAZELRIGG ([id number]) Nonetheless I do believe that the patient is going to have to go back into the compression wrap I think he was doing a lot better as far as the healing of the wound when he was wrapped. His family members in agreement with that plan today as well. Objective Constitutional Well-nourished and well-hydrated in no acute distress. Vitals Time Taken: 11:03 AM, Height: 71 in, Weight: 209 lbs, BMI: 29.1, Temperature: 97.9 F, Pulse: 93 bpm, Respiratory Rate: 18 breaths/min, Blood Pressure: 139/80 mmHg. Respiratory normal breathing without difficulty. Psychiatric this patient is able to make decisions and demonstrates good insight into disease process. Alert and Oriented x 3. pleasant and cooperative. General Notes: Upon inspection  patient's wound bed actually showed signs of good granulation epithelization at this point. No sharp debridement was necessary today which is great news mechanically I was able to clear this away with saline and gauze. Overall the patient seems to be doing a great job as far as getting around compared to last week he was very weak and out of it last week again apparently this was due to a sinus and upper respiratory issue. Integumentary (Hair, Skin) Wound #5 status is Open. Original cause of wound was Gradually Appeared. The date acquired was: 04/29/2020. The wound has been in treatment 5 weeks. The wound is located on the Right,Lateral Lower Leg. The wound measures 2.4cm length x 1.5cm width x  0.1cm depth; 2.827cm^2 area and 0.283cm^3 volume. There is Fat Layer (Subcutaneous Tissue) exposed. There is no tunneling or undermining noted. There is a medium amount of serosanguineous drainage noted. There is medium (34-66%) red granulation within the wound bed. There is a medium (34-66%) amount of necrotic tissue within the wound bed including Adherent Slough. Wound #6 status is Open. Original cause of wound was Trauma. The date acquired was: 06/23/2020. The wound has been in treatment 3 weeks. The wound is located on the Right Elbow. The wound measures 0.3cm length x 0.3cm width x 0.1cm depth; 0.071cm^2 area and 0.007cm^3 volume. There is Fat Layer (Subcutaneous Tissue) exposed. There is no tunneling or undermining noted. There is a none present amount of drainage noted. There is no granulation within the wound bed. There is a large (67-100%) amount of necrotic tissue within the wound bed including Eschar. Assessment Active Problems ICD-10 Venous insufficiency (chronic) (peripheral) Lymphedema, not elsewhere classified Non-pressure chronic ulcer of other part of right lower leg with fat layer exposed Laceration without foreign body of right elbow, initial encounter Paroxysmal atrial fibrillation Procedures Wound #6 Pre-procedure diagnosis of Wound #6 is a Trauma, Other located on the Right Elbow . There was a Selective/Open Wound Skin/Dermis Debridement with a total area of 0.09 sq cm performed by Tommie Sams., PA-C. With the following instrument(s): Curette to remove Viable and Non-Viable tissue/material. Material removed includes Eschar and Skin: Dermis and. A time out was conducted at 11:43, prior to the start of the procedure. A Minimum amount of bleeding was controlled with Pressure. The procedure was tolerated well. Post Debridement Measurements: 0.3cm length x 0.3cm width x 0.1cm depth; 0.007cm^3 volume. Character of Wound/Ulcer Post Debridement is improved. Post procedure  Diagnosis Wound #6: Same as Pre-Procedure BREYDON, SENTERS ([id number]) Wound #5 Pre-procedure diagnosis of Wound #5 is a Venous Leg Ulcer located on the Right,Lateral Lower Leg . There was a Three Layer Compression Therapy Procedure by Donnamarie Poag, RN. Post procedure Diagnosis Wound #5: Same as Pre-Procedure Plan Follow-up Appointments: Return Appointment in 1 week. Bathing/ Shower/ Hygiene: May shower with wound dressing protected with water repellent cover or cast protector. Edema Control - Lymphedema / Segmental Compressive Device / Other: Optional: One layer of unna paste to top of compression wrap (to act as an anchor). Elevate, Exercise Daily and Avoid Standing for Long Periods of Time. Elevate legs to the level of the heart and pump ankles as often as possible Elevate leg(s) parallel to the floor when sitting. WOUND #5: - Lower Leg Wound Laterality: Right, Lateral Cleanser: Byram Ancillary Kit - 15 Day Supply (Generic) 1 x Per Week/ Discharge Instructions: Use supplies as instructed; Kit contains: (15) Saline Bullets; (15) 3x3 Gauze; 15 pr Gloves Cleanser: Soap and Water 1 x Per Week/  Discharge Instructions: Gently cleanse wound with antibacterial soap, rinse and pat dry prior to dressing wounds Primary Dressing: Prisma 4.34 (in) (Generic) 1 x Per Week/ Discharge Instructions: Moisten w/normal saline or sterile water; Cover wound as directed. Do not remove from wound bed. Secondary Dressing: ABD Pad 5x9 (in/in) 1 x Per Week/ Discharge Instructions: Cover with ABD pad Compression Wrap: Profore Lite LF 3 Multilayer Compression Bandaging System 1 x Per Week/ Discharge Instructions: Apply 3 multi-layer wrap as prescribed. WOUND #6: - Elbow Wound Laterality: Right Cleanser: Byram Ancillary Kit - 15 Day Supply (Generic) 1 x Per Day/30 Days Discharge Instructions: Use supplies as instructed; Kit contains: (15) Saline Bullets; (15) 3x3 Gauze; 15 pr Gloves Cleanser: Normal Saline 1 x Per  Day/30 Days Discharge Instructions: Wash your hands with soap and water. Remove old dressing, discard into plastic bag and place into trash. Cleanse the wound with Normal Saline prior to applying a clean dressing using gauze sponges, not tissues or cotton balls. Do not scrub or use excessive force. Pat dry using gauze sponges, not tissue or cotton balls. Primary Dressing: Xeroform 4x4-HBD (in/in) (Generic) 1 x Per Day/30 Days Discharge Instructions: Apply Xeroform 4x4-HBD (in/in) as directed Secondary Dressing: ABD Pad 5x9 (in/in) (Generic) 1 x Per Day/30 Days Discharge Instructions: Cover with ABD pad Secured With: Stretch Net Size 10 (yds) 1 x Per Day/30 Days Discharge Instructions: To secure dressing in place. 1. Would recommend currently that we going continue with the wound care measures as before and the patient is in agreement with the plan this includes the use of the silver collagen dressing. 2. Also can recommend an ABD pad to follow covering the wound on the leg. 3. Muscle and recommend continuation of 3 layer compression wrap. 4. With regard to the wounds on the right upper extremity I think that Xeroform is still the best way to go and the patient's family members in agreement with that plan. We will see patient back for reevaluation in 1 week here in the clinic. If anything worsens or changes patient will contact our office for additional recommendations. Electronic Signature(s) Signed: 07/17/2020 11:57:48 AM By: Worthy Keeler PA-C Entered By: Worthy Keeler on 07/17/2020 11:57:47 [name] ([id number]) -------------------------------------------------------------------------------- SuperBill Details Patient Name: [name] Date of Service: 07/17/2020 Medical Record Number: [id number] Patient Account Number: [id number] Date of Birth/Sex: 1930-06-18 (87 y.o. M) Treating RN: Carlene Coria Primary Care Provider: Gaynelle Arabian Other Clinician: Referring Provider:  Gaynelle Arabian Treating Provider/Extender: Skipper Cliche in Treatment: 5 Diagnosis Coding ICD-10 Codes Code Description I87.2 Venous insufficiency (chronic) (peripheral) I89.0 Lymphedema, not elsewhere classified L97.812 Non-pressure chronic ulcer of other part of right lower leg with fat layer exposed S51.011A Laceration without foreign body of right elbow, initial encounter I48.0 Paroxysmal atrial fibrillation Facility Procedures CPT4 Code: 39767341 Description: [phone] - DEBRIDE WOUND 1ST 20 SQ CM OR < Modifier: Quantity: 1 CPT4 Code: Description: ICD-10 Diagnosis Description S51.011A Laceration without foreign body of right elbow, initial encounter Modifier: Quantity: Physician Procedures CPT4 Code: 2409735 Description: 32992 - WC PHYS LEVEL 3 - EST PT Modifier: 25 Quantity: 1 CPT4 Code: Description: ICD-10 Diagnosis Description I87.2 Venous insufficiency (chronic) (peripheral) I89.0 Lymphedema, not elsewhere classified L97.812 Non-pressure chronic ulcer of other part of right lower leg with fat layer S51.011A Laceration without foreign  body of right elbow, initial encounter Modifier: exposed Quantity: CPT4 Code: [id number] Description: 97597 - WC PHYS DEBR WO ANESTH 20 SQ CM Modifier: Quantity: 1 CPT4 Code: Description: ICD-10 Diagnosis  Description S51.011A Laceration without foreign body of right elbow, initial encounter Modifier: Quantity: Electronic Signature(s) Signed: 07/17/2020 11:58:16 AM By: Worthy Keeler PA-C Entered By: Worthy Keeler on 07/17/2020 11:58:15

## 2020-07-22 ENCOUNTER — Ambulatory Visit: Payer: Medicare Other | Admitting: *Deleted

## 2020-07-22 ENCOUNTER — Other Ambulatory Visit: Payer: Self-pay

## 2020-07-22 DIAGNOSIS — I4891 Unspecified atrial fibrillation: Secondary | ICD-10-CM

## 2020-07-22 DIAGNOSIS — Z5181 Encounter for therapeutic drug level monitoring: Secondary | ICD-10-CM

## 2020-07-22 DIAGNOSIS — I6789 Other cerebrovascular disease: Secondary | ICD-10-CM | POA: Diagnosis not present

## 2020-07-22 LAB — POCT INR: INR: 3.9 — AB (ref 2.0–3.0)

## 2020-07-22 NOTE — Patient Instructions (Signed)
Description   Hold warfarin today and then Continue taking Warfarin 1 tablet daily except 1.5 tablets on Mondays. Recheck in 3 weeks. Call us with any medication changes [phone]

## 2020-07-23 DIAGNOSIS — L97919 Non-pressure chronic ulcer of unspecified part of right lower leg with unspecified severity: Secondary | ICD-10-CM | POA: Diagnosis not present

## 2020-07-23 DIAGNOSIS — J22 Unspecified acute lower respiratory infection: Secondary | ICD-10-CM | POA: Diagnosis not present

## 2020-07-24 ENCOUNTER — Other Ambulatory Visit: Payer: Self-pay

## 2020-07-24 ENCOUNTER — Encounter: Payer: Medicare Other | Admitting: Physician Assistant

## 2020-07-24 DIAGNOSIS — I10 Essential (primary) hypertension: Secondary | ICD-10-CM | POA: Diagnosis not present

## 2020-07-24 DIAGNOSIS — I480 Paroxysmal atrial fibrillation: Secondary | ICD-10-CM | POA: Diagnosis not present

## 2020-07-24 DIAGNOSIS — I872 Venous insufficiency (chronic) (peripheral): Secondary | ICD-10-CM | POA: Diagnosis not present

## 2020-07-24 DIAGNOSIS — I890 Lymphedema, not elsewhere classified: Secondary | ICD-10-CM | POA: Diagnosis not present

## 2020-07-24 DIAGNOSIS — S51011A Laceration without foreign body of right elbow, initial encounter: Secondary | ICD-10-CM | POA: Diagnosis not present

## 2020-07-24 DIAGNOSIS — F17200 Nicotine dependence, unspecified, uncomplicated: Secondary | ICD-10-CM | POA: Diagnosis not present

## 2020-07-24 DIAGNOSIS — Z96641 Presence of right artificial hip joint: Secondary | ICD-10-CM | POA: Diagnosis not present

## 2020-07-24 DIAGNOSIS — L97812 Non-pressure chronic ulcer of other part of right lower leg with fat layer exposed: Secondary | ICD-10-CM | POA: Diagnosis not present

## 2020-07-24 NOTE — Progress Notes (Addendum)
Danny, Mcdonald ([id number]) Visit Report for 07/24/2020 Chief Complaint Document Details Patient Name: [name], [name]. Date of Service: 07/24/2020 8:15 AM Medical Record Number: [id number] Patient Account Number: [id number] Date of Birth/Sex: Jul 08, 1930 (87 y.o. M) Treating RN: Dolan Amen Primary Care Provider: Gaynelle Arabian Other Clinician: Referring Provider: Gaynelle Arabian Treating Provider/Extender: Skipper Cliche in Treatment: 6 Information Obtained from: Patient Chief Complaint Right LE Ulcers Electronic Signature(s) Signed: 07/24/2020 8:23:41 AM By: Worthy Keeler PA-C Entered By: Worthy Keeler on 07/24/2020 08:23:41 [name] ([id number]) -------------------------------------------------------------------------------- Debridement Details Patient Name: [name] Date of Service: 07/24/2020 8:15 AM Medical Record Number: [id number] Patient Account Number: [id number] Date of Birth/Sex: [birth year] (87 y.o. M) Treating RN: Dolan Amen Primary Care Provider: Gaynelle Arabian Other Clinician: Referring Provider: Gaynelle Arabian Treating Provider/Extender: Skipper Cliche in Treatment: 6 Debridement Performed for Wound #5 Right,Lateral Lower Leg Assessment: Performed By: Physician Tommie Sams., PA-C Debridement Type: Debridement Severity of Tissue Pre Debridement: Fat layer exposed Level of Consciousness (Pre- Awake and Alert procedure): Pre-procedure Verification/Time Out Yes - 08:41 Taken: Start Time: 08:41 Total Area Debrided (L x W): 2.7 (cm) x 1.1 (cm) = 2.97 (cm) Tissue and other material Viable, Non-Viable, Slough, Subcutaneous, Biofilm, Slough debrided: Level: Skin/Subcutaneous Tissue Debridement Description: Excisional Instrument: Curette Bleeding: Minimum Hemostasis Achieved: Pressure Response to Treatment: Procedure was tolerated well Level of Consciousness (Post- Awake and Alert procedure): Post Debridement Measurements of  Total Wound Length: (cm) 2.7 Width: (cm) 1.1 Depth: (cm) 0.2 Volume: (cm) 0.467 Character of Wound/Ulcer Post Debridement: Stable Severity of Tissue Post Debridement: Fat layer exposed Post Procedure Diagnosis Same as Pre-procedure Electronic Signature(s) Signed: 07/24/2020 4:02:25 PM By: Dolan Amen RN Signed: 07/24/2020 4:42:29 PM By: Worthy Keeler PA-C Entered By: Dolan Amen on 07/24/2020 08:42:42 [name] ([id number]) -------------------------------------------------------------------------------- HPI Details Patient Name: [name] Date of Service: 07/24/2020 8:15 AM Medical Record Number: [id number] Patient Account Number: [id number] Date of Birth/Sex: Jul 10, 1930 (87 y.o. M) Treating RN: Dolan Amen Primary Care Provider: Gaynelle Arabian Other Clinician: Referring Provider: Gaynelle Arabian Treating Provider/Extender: Skipper Cliche in Treatment: 6 History of Present Illness HPI Description: 87 year old gentleman has been referred to [place] by his PCP Dr. Gaynelle Arabian, for a ulcer on the left posterior lower leg which has been there for about 6 weeks. The patient was thought to have an infection and was put on doxycycline for 14 days and referred to the wound center. He has a past medical history of paroxysmal atrial fibrillation, hypertension, diverticulosis, status post right hip replacement, appendectomy. He has not been a smoker since 50. the anterior shin wound has been more recent about 2 weeks and this was caused probably due to a blunt abrasion. 03/06/17 on evaluation today patient's ulcer appears to show signs of excellent improvement. He is definitely showing additional evidence of granulation which is great news. Overall I'm extremely pleased with the progress that he has been making from the beginning to where we are right now. With that being said I would like to potentially try different dressing to see if we could stimulate this to heal  even faster. 03/13/17 on evaluation today patient appears to be doing little better in regard to his leg ulcer at this time. He has been tolerating the dressing changes without complication. There does not appear to be any evidence of infection which is good news. He does have some additional granulation observed at this point and I do believe that the  silver collagen dressing has been of benefit in this regard. Overall I'm very pleased with the progress she's made since last visit the only downside is that he does have additional maceration due to drainage compared to previous we may need to do something to address this. 03/27/17 on evaluation today patient appears to be doing well in regard to his left posterior lower surety ulcer. He has a significant area of epithelial growth which seems to be doing excellent at this point in time. He also seems to be well controlled as far as slough is concerned and drainage there is no evidence of maceration and there really was no slough that could not be cleaned off with saline and gauze. Overall patient seems to be making excellent progress. 04/10/17 on evaluation today patient appears to be doing better in regard to the original ulcer for which we have been treating him. Unfortunately he has two areas where he sustained injury to his posterior leg more proximal to where the current ulceration is. This was due to it appears to be tape and adhesive at this point. When he was using the Allevyn dressing there was no problems however they ran out of these and since that point there seems to have been more irritation around the wound from the Band-Aids that his wife has been using in their place. Other than this which appear to be very superficial new ulcers everything appears to be progressing nicely. 04/24/17 on evaluation today patient appears to be doing excellent in regard to his left posterior lower extremity ulcer. He is been tolerating the dressing changes  without complication fortunately there does not appear to be any evidence of infection at this point in time. Overall he has made significant improvements since I last saw him. 05/08/17 on evaluation today patient's wound appears to be completely healed at this point. Fortunately this is great news and though it is closed over with epithelium there still some healing to be done underneath for this completely toughen up this was explained to the patient and his wife today as well. Readmission: 06/12/2020 this is a gentleman whom I have previously seen here in the clinic before in regards to similar issues with wounds on his lower extremities. 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 He is also very swollen the skin is tense and even shiny due to the fluid buildup. Obviously I think this is definitely something that we need to address sooner rather than later as far as that is concerned. Fortunately there does not appear to be any signs of active infection he is on a blood thinner, Coumadin, this is due to chronic atrial fibrillation. 06/19/2020 upon evaluation today patient's wound is actually showing signs of significant improvement. I am actually extremely pleased with where things stand he is going require little bit of debridement to clear away some of the necrotic debris on the surface of the wound he actually is okay with that he is on Coumadin selective do this carefully but nonetheless I think it is definitely achievable.. 06/26/2020 upon evaluation today patient appears to be doing well in regard to his leg ulcer. I feel like he is showing signs of improvement and overall very pleased in this regard. Unfortunately he did have a fall where he injured his right elbow this is new and subsequently does need to be addressed  as well with dressings today.  Fortunately it does not appear to be too bad. 07/03/2020 upon evaluation today patient appears to be doing poorly upon inspection today. In fact we did not even get a look at his wounds because he was not acting himself at all. He was very lethargic leading to the left when he came in in his wheelchair. He did not seem to be extremely responsive which also have me very concerned. His family member who is with him states this has been going on for the last day. With that being said he is not exactly sure what is going on either. Overall the patient does not appear to be in very good shape compared to what I have previously noted. I am not exactly sure what the issue may be but I think he probably needs to be evaluated at the ER to be honest. 07/17/2020 upon evaluation today patient's wound is actually showing signs of good improvement. Is a little bit more swollen than it was previous but again I think this is secondary to the fact to be honest that he has not had the wrap on for the past week but nonetheless the wound seems to be doing great even so. He is able to get around and walk a little bit better today which is great news as well. I am very pleased in that regard. Nonetheless I do believe that the patient is going to have to go back into the compression wrap I think he was doing a lot better as far as the healing of the wound when he was wrapped. His family members in agreement with that plan today as well. 07/24/2020 upon evaluation today patient actually appears to be doing quite well in regard to his wounds. He has been tolerating the dressing BURREL, LEGRAND. ([id number]) changes without complication and overall I am quite pleased actually with where things stand today. There does not appear to be any evidence of infection which is great news and overall the patient is doing extremely well. Electronic Signature(s) Signed: 07/24/2020 3:35:49 PM By: Worthy Keeler PA-C Entered By: Worthy Keeler  on 07/24/2020 15:35:48 [name] ([id number]) -------------------------------------------------------------------------------- Physical Exam Details Patient Name: [name] Date of Service: 07/24/2020 8:15 AM Medical Record Number: [id number] Patient Account Number: [id number] Date of Birth/Sex: 11/03/1930 (87 y.o. M) Treating RN: Dolan Amen Primary Care Provider: Gaynelle Arabian Other Clinician: Referring Provider: Gaynelle Arabian Treating Provider/Extender: Skipper Cliche in Treatment: 6 Constitutional Well-nourished and well-hydrated in no acute distress. Respiratory normal breathing without difficulty. Psychiatric this patient is able to make decisions and demonstrates good insight into disease process. Alert and Oriented x 3. pleasant and cooperative. Notes Patient's wound again showed signs of good granulation epithelization at this point. I think he is making great progress and I see no signs of an issue here at all. Electronic Signature(s) Signed: 07/24/2020 3:36:08 PM By: Worthy Keeler PA-C Entered By: Worthy Keeler on 07/24/2020 15:36:08 [name] ([id number]) -------------------------------------------------------------------------------- Physician Orders Details Patient Name: [name] Date of Service: 07/24/2020 8:15 AM Medical Record Number: [id number] Patient Account Number: [id number] Date of Birth/Sex: June 27, 1930 (87 y.o. M) Treating RN: Dolan Amen Primary Care Provider: Gaynelle Arabian Other Clinician: Referring Provider: Gaynelle Arabian Treating Provider/Extender: Skipper Cliche in Treatment: 6 Verbal / Phone Orders: No Diagnosis Coding ICD-10 Coding Code Description I87.2 Venous insufficiency (chronic) (peripheral) I89.0 Lymphedema, not elsewhere classified L97.812 Non-pressure chronic ulcer of other part of right  lower leg with fat layer exposed S51.011A Laceration without foreign body of right elbow, initial  encounter I48.0 Paroxysmal atrial fibrillation Follow-up Appointments o Return Appointment in 1 week. Bathing/ Shower/ Hygiene o May shower with wound dressing protected with water repellent cover or cast protector. Edema Control - Lymphedema / Segmental Compressive Device / Other o Optional: One layer of unna paste to top of compression wrap (to act as an anchor). o Elevate, Exercise Daily and Avoid Standing for Long Periods of Time. o Elevate legs to the level of the heart and pump ankles as often as possible o Elevate leg(s) parallel to the floor when sitting. Wound Treatment Wound #5 - Lower Leg Wound Laterality: Right, Lateral Cleanser: Soap and Water 1 x Per Week/15 Days Discharge Instructions: Gently cleanse wound with antibacterial soap, rinse and pat dry prior to dressing wounds Primary Dressing: Prisma 4.34 (in) (Generic) 1 x Per Week/15 Days Discharge Instructions: Moisten w/normal saline or sterile water; Cover wound as directed. Do not remove from wound bed. Secondary Dressing: ABD Pad 5x9 (in/in) 1 x Per Week/15 Days Discharge Instructions: Cover with ABD pad Compression Wrap: Profore Lite LF 3 Multilayer Compression Bandaging System 1 x Per Week/15 Days Discharge Instructions: Apply 3 multi-layer wrap as prescribed. Electronic Signature(s) Signed: 07/24/2020 4:02:25 PM By: Dolan Amen RN Signed: 07/24/2020 4:42:29 PM By: Worthy Keeler PA-C Entered By: Dolan Amen on 07/24/2020 08:43:08 [name] ([id number]) -------------------------------------------------------------------------------- Problem List Details Patient Name: DREQUAN, IRONSIDE. Date of Service: 07/24/2020 8:15 AM Medical Record Number: [id number] Patient Account Number: [id number] Date of Birth/Sex: 1930/07/23 (87 y.o. M) Treating RN: Dolan Amen Primary Care Provider: Gaynelle Arabian Other Clinician: Referring Provider: Gaynelle Arabian Treating Provider/Extender: Skipper Cliche  in Treatment: 6 Active Problems ICD-10 Encounter Code Description Active Date MDM Diagnosis I87.2 Venous insufficiency (chronic) (peripheral) 06/12/2020 No Yes I89.0 Lymphedema, not elsewhere classified 06/12/2020 No Yes L97.812 Non-pressure chronic ulcer of other part of right lower leg with fat layer 06/12/2020 No Yes exposed S51.011A Laceration without foreign body of right elbow, initial encounter 06/26/2020 No Yes I48.0 Paroxysmal atrial fibrillation 06/12/2020 No Yes Inactive Problems Resolved Problems Electronic Signature(s) Signed: 07/24/2020 8:23:35 AM By: Worthy Keeler PA-C Entered By: Worthy Keeler on 07/24/2020 08:23:35 [name] ([id number]) -------------------------------------------------------------------------------- Progress Note Details Patient Name: [name] Date of Service: 07/24/2020 8:15 AM Medical Record Number: [id number] Patient Account Number: [id number] Date of Birth/Sex: Jun 26, 1930 (87 y.o. M) Treating RN: Dolan Amen Primary Care Provider: Gaynelle Arabian Other Clinician: Referring Provider: Gaynelle Arabian Treating Provider/Extender: Skipper Cliche in Treatment: 6 Subjective Chief Complaint Information obtained from Patient Right LE Ulcers History of Present Illness (HPI) 87 year old gentleman has been referred to [place] by his PCP Dr. Gaynelle Arabian, for a ulcer on the left posterior lower leg which has been there for about 6 weeks. The patient was thought to have an infection and was put on doxycycline for 14 days and referred to the wound center. He has a past medical history of paroxysmal atrial fibrillation, hypertension, diverticulosis, status post right hip replacement, appendectomy. He has not been a smoker since 57. the anterior shin wound has been more recent about 2 weeks and this was caused probably due to a blunt abrasion. 03/06/17 on evaluation today patient's ulcer appears to show signs of excellent improvement. He is  definitely showing additional evidence of granulation which is great news. Overall I'm extremely pleased with the progress that he has been making from the beginning  to where we are right now. With that being said I would like to potentially try different dressing to see if we could stimulate this to heal even faster. 03/13/17 on evaluation today patient appears to be doing little better in regard to his leg ulcer at this time. He has been tolerating the dressing changes without complication. There does not appear to be any evidence of infection which is good news. He does have some additional granulation observed at this point and I do believe that the silver collagen dressing has been of benefit in this regard. Overall I'm very pleased with the progress she's made since last visit the only downside is that he does have additional maceration due to drainage compared to previous we may need to do something to address this. 03/27/17 on evaluation today patient appears to be doing well in regard to his left posterior lower surety ulcer. He has a significant area of epithelial growth which seems to be doing excellent at this point in time. He also seems to be well controlled as far as slough is concerned and drainage there is no evidence of maceration and there really was no slough that could not be cleaned off with saline and gauze. Overall patient seems to be making excellent progress. 04/10/17 on evaluation today patient appears to be doing better in regard to the original ulcer for which we have been treating him. Unfortunately he has two areas where he sustained injury to his posterior leg more proximal to where the current ulceration is. This was due to it appears to be tape and adhesive at this point. When he was using the Allevyn dressing there was no problems however they ran out of these and since that point there seems to have been more irritation around the wound from the Band-Aids that his wife  has been using in their place. Other than this which appear to be very superficial new ulcers everything appears to be progressing nicely. 04/24/17 on evaluation today patient appears to be doing excellent in regard to his left posterior lower extremity ulcer. He is been tolerating the dressing changes without complication fortunately there does not appear to be any evidence of infection at this point in time. Overall he has made significant improvements since I last saw him. 05/08/17 on evaluation today patient's wound appears to be completely healed at this point. Fortunately this is great news and though it is closed over with epithelium there still some healing to be done underneath for this completely toughen up this was explained to the patient and his wife today as well. Readmission: 06/12/2020 this is a gentleman whom I have previously seen here in the clinic before in regards to similar issues with wounds on his lower extremities. 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 He is also very swollen the skin is tense and even shiny due to the fluid buildup. Obviously I think this is definitely something that we need to address sooner rather than later as far as that is concerned. Fortunately there does not appear to be any signs of active infection he is on a blood thinner, Coumadin, this is due to chronic atrial fibrillation. 06/19/2020 upon evaluation today patient's wound is actually showing signs of significant improvement. I am actually extremely pleased with where things stand he is going require little bit of debridement to clear  away some of the necrotic debris on the surface of the wound he actually is okay with that he is on Coumadin selective do this carefully but nonetheless I think it is definitely achievable.. 06/26/2020 upon evaluation today  patient appears to be doing well in regard to his leg ulcer. I feel like he is showing signs of improvement and overall very pleased in this regard. Unfortunately he did have a fall where he injured his right elbow this is new and subsequently does need to be addressed as well with dressings today. Fortunately it does not appear to be too bad. 07/03/2020 upon evaluation today patient appears to be doing poorly upon inspection today. In fact we did not even get a look at his wounds because he was not acting himself at all. He was very lethargic leading to the left when he came in in his wheelchair. He did not seem to be extremely responsive which also have me very concerned. His family member who is with him states this has been going on for the last day. With that being said he is not exactly sure what is going on either. Overall the patient does not appear to be in very good shape compared to what I have previously noted. I am not exactly sure what the issue may be but I think he probably needs to be evaluated at the ER to be honest. 07/17/2020 upon evaluation today patient's wound is actually showing signs of good improvement. Is a little bit more swollen than it was previous but again I think this is secondary to the fact to be honest that he has not had the wrap on for the past week but nonetheless the wound seems to be doing great even so. He is able to get around and walk a little bit better today which is great news as well. I am very pleased in that regard. PROMISE, BUSHONG ([id number]) Nonetheless I do believe that the patient is going to have to go back into the compression wrap I think he was doing a lot better as far as the healing of the wound when he was wrapped. His family members in agreement with that plan today as well. 07/24/2020 upon evaluation today patient actually appears to be doing quite well in regard to his wounds. He has been tolerating the dressing changes without complication  and overall I am quite pleased actually with where things stand today. There does not appear to be any evidence of infection which is great news and overall the patient is doing extremely well. Objective Constitutional Well-nourished and well-hydrated in no acute distress. Vitals Time Taken: 8:24 AM, Height: 71 in, Weight: 209 lbs, BMI: 29.1, Temperature: 97.8 F, Pulse: 84 bpm, Respiratory Rate: 18 breaths/min, Blood Pressure: 168/75 mmHg. Respiratory normal breathing without difficulty. Psychiatric this patient is able to make decisions and demonstrates good insight into disease process. Alert and Oriented x 3. pleasant and cooperative. General Notes: Patient's wound again showed signs of good granulation epithelization at this point. I think he is making great progress and I see no signs of an issue here at all. Integumentary (Hair, Skin) Wound #5 status is Open. Original cause of wound was Gradually Appeared. The date acquired was: 04/29/2020. The wound has been in treatment 6 weeks. The wound is located on the Right,Lateral Lower Leg. The wound measures 2.7cm length x 1.1cm width x 0.1cm depth; 2.333cm^2 area and 0.233cm^3 volume. There is Fat Layer (Subcutaneous Tissue) exposed. There is no tunneling or  undermining noted. There is a medium amount of serosanguineous drainage noted. There is medium (34-66%) red granulation within the wound bed. There is a medium (34-66%) amount of necrotic tissue within the wound bed including Adherent Slough. Wound #6 status is Healed - Epithelialized. Original cause of wound was Trauma. The date acquired was: 06/23/2020. The wound has been in treatment 4 weeks. The wound is located on the Right Elbow. The wound measures 0cm length x 0cm width x 0cm depth; 0cm^2 area and 0cm^3 volume. There is no tunneling or undermining noted. There is a none present amount of drainage noted. There is no granulation within the wound bed. There is no necrotic tissue within  the wound bed. Assessment Active Problems ICD-10 Venous insufficiency (chronic) (peripheral) Lymphedema, not elsewhere classified Non-pressure chronic ulcer of other part of right lower leg with fat layer exposed Laceration without foreign body of right elbow, initial encounter Paroxysmal atrial fibrillation Procedures Wound #5 Pre-procedure diagnosis of Wound #5 is a Venous Leg Ulcer located on the Right,Lateral Lower Leg .Severity of Tissue Pre Debridement is: Fat layer exposed. There was a Excisional Skin/Subcutaneous Tissue Debridement with a total area of 2.97 sq cm performed by Tommie Sams., PA-C. With the following instrument(s): Curette to remove Viable and Non-Viable tissue/material. Material removed includes Subcutaneous Tissue, Slough, and Biofilm. A time out was conducted at 08:41, prior to the start of the procedure. A Minimum amount of bleeding was controlled with Pressure. The procedure was tolerated well. Post Debridement Measurements: 2.7cm length x 1.1cm width x 0.2cm depth; 0.467cm^3 volume. Character of Wound/Ulcer Post Debridement is stable. Severity of Tissue Post Debridement is: Fat layer exposed. 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 There was a Three Layer Compression Therapy Procedure with a pre-treatment ABI of 1.4 by Dolan Amen, RN. Post procedure Diagnosis Wound #5: Same as Pre-Procedure Plan Follow-up Appointments: Return Appointment in 1 week. Bathing/ Shower/ Hygiene: May shower with wound dressing protected with water repellent cover or cast protector. Edema Control - Lymphedema / Segmental Compressive Device / Other: Optional: One layer of unna paste to top of compression wrap (to act as an anchor). Elevate, Exercise Daily and Avoid Standing for Long Periods of Time. Elevate legs to the level of the  heart and pump ankles as often as possible Elevate leg(s) parallel to the floor when sitting. WOUND #5: - Lower Leg Wound Laterality: Right, Lateral Cleanser: Soap and Water 1 x Per Week/15 Days Discharge Instructions: Gently cleanse wound with antibacterial soap, rinse and pat dry prior to dressing wounds Primary Dressing: Prisma 4.34 (in) (Generic) 1 x Per Week/15 Days Discharge Instructions: Moisten w/normal saline or sterile water; Cover wound as directed. Do not remove from wound bed. Secondary Dressing: ABD Pad 5x9 (in/in) 1 x Per Week/15 Days Discharge Instructions: Cover with ABD pad Compression Wrap: Profore Lite LF 3 Multilayer Compression Bandaging System 1 x Per Week/15 Days Discharge Instructions: Apply 3 multi-layer wrap as prescribed. 1. Would recommend that we going continue with wound care measures as before. Patient is in agreement with the plan. This includes the use of silver collagen to the wound bed followed by an ABD pad to cover. 2. Also can recommend that we continue with 3 layer compression wrap which I think is doing a great job. We will see patient back for reevaluation in 1 week here in the clinic. If  anything worsens or changes patient will contact our office for additional recommendations. Electronic Signature(s) Signed: 07/24/2020 3:36:30 PM By: Worthy Keeler PA-C Entered By: Worthy Keeler on 07/24/2020 15:36:30 [name] ([id number]) -------------------------------------------------------------------------------- SuperBill Details Patient Name: [name] Date of Service: 07/24/2020 Medical Record Number: [id number] Patient Account Number: [id number] Date of Birth/Sex: 12-13-1930 (87 y.o. M) Treating RN: Dolan Amen Primary Care Provider: Gaynelle Arabian Other Clinician: Referring Provider: Gaynelle Arabian Treating Provider/Extender: Skipper Cliche in Treatment: 6 Diagnosis Coding ICD-10 Codes Code Description I87.2 Venous  insufficiency (chronic) (peripheral) I89.0 Lymphedema, not elsewhere classified L97.812 Non-pressure chronic ulcer of other part of right lower leg with fat layer exposed S51.011A Laceration without foreign body of right elbow, initial encounter I48.0 Paroxysmal atrial fibrillation Facility Procedures CPT4 Code: 83094076 Description: 80881 - DEB SUBQ TISSUE 20 SQ CM/< Modifier: Quantity: 1 CPT4 Code: Description: ICD-10 Diagnosis Description J03.159 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

## 2020-07-31 ENCOUNTER — Encounter: Payer: Medicare Other | Admitting: Physician Assistant

## 2020-07-31 ENCOUNTER — Other Ambulatory Visit: Payer: Self-pay

## 2020-07-31 DIAGNOSIS — Z96641 Presence of right artificial hip joint: Secondary | ICD-10-CM | POA: Diagnosis not present

## 2020-07-31 DIAGNOSIS — I10 Essential (primary) hypertension: Secondary | ICD-10-CM | POA: Diagnosis not present

## 2020-07-31 DIAGNOSIS — I890 Lymphedema, not elsewhere classified: Secondary | ICD-10-CM | POA: Diagnosis not present

## 2020-07-31 DIAGNOSIS — I872 Venous insufficiency (chronic) (peripheral): Secondary | ICD-10-CM | POA: Diagnosis not present

## 2020-07-31 DIAGNOSIS — L97812 Non-pressure chronic ulcer of other part of right lower leg with fat layer exposed: Secondary | ICD-10-CM | POA: Diagnosis not present

## 2020-07-31 DIAGNOSIS — F17200 Nicotine dependence, unspecified, uncomplicated: Secondary | ICD-10-CM | POA: Diagnosis not present

## 2020-07-31 DIAGNOSIS — S51011A Laceration without foreign body of right elbow, initial encounter: Secondary | ICD-10-CM | POA: Diagnosis not present

## 2020-07-31 DIAGNOSIS — I480 Paroxysmal atrial fibrillation: Secondary | ICD-10-CM | POA: Diagnosis not present

## 2020-07-31 NOTE — Progress Notes (Addendum)
[name], [name] ([id number]) Visit Report for 07/31/2020 Chief Complaint Document Details Patient Name: [name], [name]. Date of Service: 07/31/2020 8:15 AM Medical Record Number: [id number] Patient Account Number: [id number] Date of Birth/Sex: Dec 13, 1930 (87 y.o. M) Treating RN: Dolan Amen Primary Care Provider: Gaynelle Arabian Other Clinician: Referring Provider: Gaynelle Arabian Treating Provider/Extender: Skipper Cliche in Treatment: 7 Information Obtained from: Patient Chief Complaint Right LE Ulcers Electronic Signature(s) Signed: 07/31/2020 8:27:33 AM By: Worthy Keeler PA-C Entered By: Worthy Keeler on 07/31/2020 08:27:33 [name] ([id number]) -------------------------------------------------------------------------------- HPI Details Patient Name: [name] Date of Service: 07/31/2020 8:15 AM Medical Record Number: [id number] Patient Account Number: [id number] Date of Birth/Sex: May 09, 1930 (87 y.o. M) Treating RN: Dolan Amen Primary Care Provider: Gaynelle Arabian Other Clinician: Referring Provider: Gaynelle Arabian Treating Provider/Extender: Skipper Cliche in Treatment: 7 History of Present Illness HPI Description: 87 year old gentleman has been referred to [place] by his PCP Dr. Gaynelle Arabian, for a ulcer on the left posterior lower leg which has been there for about 6 weeks. The patient was thought to have an infection and was put on doxycycline for 14 days and referred to the wound center. He has a past medical history of paroxysmal atrial fibrillation, hypertension, diverticulosis, status post right hip replacement, appendectomy. He has not been a smoker since 47. the anterior shin wound has been more recent about 2 weeks and this was caused probably due to a blunt abrasion. 03/06/17 on evaluation today patient's ulcer appears to show signs of excellent improvement. He is definitely showing additional evidence of granulation which is great news.  Overall I'm extremely pleased with the progress that he has been making from the beginning to where we are right now. With that being said I would like to potentially try different dressing to see if we could stimulate this to heal even faster. 03/13/17 on evaluation today patient appears to be doing little better in regard to his leg ulcer at this time. He has been tolerating the dressing changes without complication. There does not appear to be any evidence of infection which is good news. He does have some additional granulation observed at this point and I do believe that the silver collagen dressing has been of benefit in this regard. Overall I'm very pleased with the progress she's made since last visit the only downside is that he does have additional maceration due to drainage compared to previous we may need to do something to address this. 03/27/17 on evaluation today patient appears to be doing well in regard to his left posterior lower surety ulcer. He has a significant area of epithelial growth which seems to be doing excellent at this point in time. He also seems to be well controlled as far as slough is concerned and drainage there is no evidence of maceration and there really was no slough that could not be cleaned off with saline and gauze. Overall patient seems to be making excellent progress. 04/10/17 on evaluation today patient appears to be doing better in regard to the original ulcer for which we have been treating him. Unfortunately he has two areas where he sustained injury to his posterior leg more proximal to where the current ulceration is. This was due to it appears to be tape and adhesive at this point. When he was using the Allevyn dressing there was no problems however they ran out of these and since that point there seems to have been more irritation around the wound from the  Band-Aids that his wife has been using in their place. Other than this which appear to be very  superficial new ulcers everything appears to be progressing nicely. 04/24/17 on evaluation today patient appears to be doing excellent in regard to his left posterior lower extremity ulcer. He is been tolerating the dressing changes without complication fortunately there does not appear to be any evidence of infection at this point in time. Overall he has made significant improvements since I last saw him. 05/08/17 on evaluation today patient's wound appears to be completely healed at this point. Fortunately this is great news and though it is closed over with epithelium there still some healing to be done underneath for this completely toughen up this was explained to the patient and his wife today as well. Readmission: 06/12/2020 this is a gentleman whom I have previously seen here in the clinic before in regards to similar issues with wounds on his lower extremities. 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 He is also very swollen the skin is tense and even shiny due to the fluid buildup. Obviously I think this is definitely something that we need to address sooner rather than later as far as that is concerned. Fortunately there does not appear to be any signs of active infection he is on a blood thinner, Coumadin, this is due to chronic atrial fibrillation. 06/19/2020 upon evaluation today patient's wound is actually showing signs of significant improvement. I am actually extremely pleased with where things stand he is going require little bit of debridement to clear away some of the necrotic debris on the surface of the wound he actually is okay with that he is on Coumadin selective do this carefully but nonetheless I think it is definitely achievable.. 06/26/2020 upon evaluation today patient appears to be doing well in regard to his leg ulcer. I feel like he  is showing signs of improvement and overall very pleased in this regard. Unfortunately he did have a fall where he injured his right elbow this is new and subsequently does need to be addressed as well with dressings today. Fortunately it does not appear to be too bad. 07/03/2020 upon evaluation today patient appears to be doing poorly upon inspection today. In fact we did not even get a look at his wounds because he was not acting himself at all. He was very lethargic leading to the left when he came in in his wheelchair. He did not seem to be extremely responsive which also have me very concerned. His family member who is with him states this has been going on for the last day. With that being said he is not exactly sure what is going on either. Overall the patient does not appear to be in very good shape compared to what I have previously noted. I am not exactly sure what the issue may be but I think he probably needs to be evaluated at the ER to be honest. 07/17/2020 upon evaluation today patient's wound is actually showing signs of good improvement. Is a little bit more swollen than it was previous but again I think this is secondary to the fact to be honest that he has not had the wrap on for the past week but nonetheless the wound seems to be doing great even so. He is able to get around and walk a little  bit better today which is great news as well. I am very pleased in that regard. Nonetheless I do believe that the patient is going to have to go back into the compression wrap I think he was doing a lot better as far as the healing of the wound when he was wrapped. His family members in agreement with that plan today as well. 07/24/2020 upon evaluation today patient actually appears to be doing quite well in regard to his wounds. He has been tolerating the dressing changes without complication and overall I am quite pleased actually with where things stand today. There does not appear to be any  evidence of [name], [name]. ([id number]) infection which is great news and overall the patient is doing extremely well. 07/31/2020 upon evaluation today patient appears to be doing well with regard to his wound. He has been tolerating the dressing changes without complication and overall I am actually extremely pleased with where things stand today. I do believe that the patient is making excellent progress all things considered here. And his measurements today reflect that. Electronic Signature(s) Signed: 07/31/2020 6:44:39 PM By: Worthy Keeler PA-C Entered By: Worthy Keeler on 07/31/2020 18:44:39 [name] ([id number]) -------------------------------------------------------------------------------- Physical Exam Details Patient Name: [name], [name] Date of Service: 07/31/2020 8:15 AM Medical Record Number: [id number] Patient Account Number: [id number] Date of Birth/Sex: 18-Feb-1930 (87 y.o. M) Treating RN: Dolan Amen Primary Care Provider: Gaynelle Arabian Other Clinician: Referring Provider: Gaynelle Arabian Treating Provider/Extender: Skipper Cliche in Treatment: 7 Constitutional Well-nourished and well-hydrated in no acute distress. Respiratory normal breathing without difficulty. Psychiatric this patient is able to make decisions and demonstrates good insight into disease process. Alert and Oriented x 3. pleasant and cooperative. Notes Patient's wound bed showed signs of good granulation epithelization at this point. There does not appear to be any signs of infection which is great news and overall I am extremely pleased. I do believe that again he is making excellent progress in regard to his wound both in size and overall appearance. Electronic Signature(s) Signed: 07/31/2020 6:45:00 PM By: Worthy Keeler PA-C Entered By: Worthy Keeler on 07/31/2020 18:45:00 [name]  ([id number]) -------------------------------------------------------------------------------- Physician Orders Details Patient Name: [name] Date of Service: 07/31/2020 8:15 AM Medical Record Number: [id number] Patient Account Number: [id number] Date of Birth/Sex: 07-20-1930 (87 y.o. M) Treating RN: Dolan Amen Primary Care Provider: Gaynelle Arabian Other Clinician: Referring Provider: Gaynelle Arabian Treating Provider/Extender: Skipper Cliche in Treatment: 7 Verbal / Phone Orders: No Diagnosis Coding ICD-10 Coding Code Description I87.2 Venous insufficiency (chronic) (peripheral) I89.0 Lymphedema, not elsewhere classified L97.812 Non-pressure chronic ulcer of other part of right lower leg with fat layer exposed S51.011A Laceration without foreign body of right elbow, initial encounter I48.0 Paroxysmal atrial fibrillation Follow-up Appointments o Return Appointment in 1 week. Bathing/ Shower/ Hygiene o May shower with wound dressing protected with water repellent cover or cast protector. Edema Control - Lymphedema / Segmental Compressive Device / Other o Optional: One layer of unna paste to top of compression wrap (to act as an anchor). o Elevate, Exercise Daily and Avoid Standing for Long Periods of Time. o Elevate legs to the level of the heart and pump ankles as often as possible o Elevate leg(s) parallel to the floor when sitting. Wound Treatment Wound #5 - Lower Leg Wound Laterality: Right, Lateral Cleanser: Soap and Water 1 x Per Week/15 Days Discharge Instructions: Gently cleanse wound with antibacterial soap, rinse and pat dry  prior to dressing wounds Primary Dressing: Xeroform-HBD 2x2 (in/in) 1 x Per Week/15 Days Discharge Instructions: Apply Xeroform double layer to wound bed Secondary Dressing: ABD Pad 5x9 (in/in) 1 x Per Week/15 Days Discharge Instructions: Cover with ABD pad Compression Wrap: Profore Lite LF 3 Multilayer Compression Bandaging  System 1 x Per Week/15 Days Discharge Instructions: Apply 3 multi-layer wrap as prescribed. Electronic Signature(s) Signed: 07/31/2020 1:24:17 PM By: Dolan Amen RN Signed: 07/31/2020 6:45:55 PM By: Worthy Keeler PA-C Entered By: Dolan Amen on 07/31/2020 09:08:09 [name] ([id number]) -------------------------------------------------------------------------------- Problem List Details Patient Name: [name], [name]. Date of Service: 07/31/2020 8:15 AM Medical Record Number: [id number] Patient Account Number: [id number] Date of Birth/Sex: [birth year] (87 y.o. M) Treating RN: Dolan Amen Primary Care Provider: Gaynelle Arabian Other Clinician: Referring Provider: Gaynelle Arabian Treating Provider/Extender: Skipper Cliche in Treatment: 7 Active Problems ICD-10 Encounter Code Description Active Date MDM Diagnosis I87.2 Venous insufficiency (chronic) (peripheral) 06/12/2020 No Yes I89.0 Lymphedema, not elsewhere classified 06/12/2020 No Yes L97.812 Non-pressure chronic ulcer of other part of right lower leg with fat layer 06/12/2020 No Yes exposed S51.011A Laceration without foreign body of right elbow, initial encounter 06/26/2020 No Yes I48.0 Paroxysmal atrial fibrillation 06/12/2020 No Yes Inactive Problems Resolved Problems Electronic Signature(s) Signed: 07/31/2020 8:27:27 AM By: Worthy Keeler PA-C Entered By: Worthy Keeler on 07/31/2020 08:27:27 [name] ([id number]) -------------------------------------------------------------------------------- Progress Note Details Patient Name: [name] Date of Service: 07/31/2020 8:15 AM Medical Record Number: [id number] Patient Account Number: [id number] Date of Birth/Sex: 12/20/1930 (87 y.o. M) Treating RN: Dolan Amen Primary Care Provider: Gaynelle Arabian Other Clinician: Referring Provider: Gaynelle Arabian Treating Provider/Extender: Skipper Cliche in Treatment: 7 Subjective Chief  Complaint Information obtained from Patient Right LE Ulcers History of Present Illness (HPI) 87 year old gentleman has been referred to [place] by his PCP Dr. Gaynelle Arabian, for a ulcer on the left posterior lower leg which has been there for about 6 weeks. The patient was thought to have an infection and was put on doxycycline for 14 days and referred to the wound center. He has a past medical history of paroxysmal atrial fibrillation, hypertension, diverticulosis, status post right hip replacement, appendectomy. He has not been a smoker since 80. the anterior shin wound has been more recent about 2 weeks and this was caused probably due to a blunt abrasion. 03/06/17 on evaluation today patient's ulcer appears to show signs of excellent improvement. He is definitely showing additional evidence of granulation which is great news. Overall I'm extremely pleased with the progress that he has been making from the beginning to where we are right now. With that being said I would like to potentially try different dressing to see if we could stimulate this to heal even faster. 03/13/17 on evaluation today patient appears to be doing little better in regard to his leg ulcer at this time. He has been tolerating the dressing changes without complication. There does not appear to be any evidence of infection which is good news. He does have some additional granulation observed at this point and I do believe that the silver collagen dressing has been of benefit in this regard. Overall I'm very pleased with the progress she's made since last visit the only downside is that he does have additional maceration due to drainage compared to previous we may need to do something to address this. 03/27/17 on evaluation today patient appears to be doing well in regard to his left posterior lower  surety ulcer. He has a significant area of epithelial growth which seems to be doing excellent at this point in time. He also seems  to be well controlled as far as slough is concerned and drainage there is no evidence of maceration and there really was no slough that could not be cleaned off with saline and gauze. Overall patient seems to be making excellent progress. 04/10/17 on evaluation today patient appears to be doing better in regard to the original ulcer for which we have been treating him. Unfortunately he has two areas where he sustained injury to his posterior leg more proximal to where the current ulceration is. This was due to it appears to be tape and adhesive at this point. When he was using the Allevyn dressing there was no problems however they ran out of these and since that point there seems to have been more irritation around the wound from the Band-Aids that his wife has been using in their place. Other than this which appear to be very superficial new ulcers everything appears to be progressing nicely. 04/24/17 on evaluation today patient appears to be doing excellent in regard to his left posterior lower extremity ulcer. He is been tolerating the dressing changes without complication fortunately there does not appear to be any evidence of infection at this point in time. Overall he has made significant improvements since I last saw him. 05/08/17 on evaluation today patient's wound appears to be completely healed at this point. Fortunately this is great news and though it is closed over with epithelium there still some healing to be done underneath for this completely toughen up this was explained to the patient and his wife today as well. Readmission: 06/12/2020 this is a gentleman whom I have previously seen here in the clinic before in regards to similar issues with wounds on his lower extremities. 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 He is also very swollen the  skin is tense and even shiny due to the fluid buildup. Obviously I think this is definitely something that we need to address sooner rather than later as far as that is concerned. Fortunately there does not appear to be any signs of active infection he is on a blood thinner, Coumadin, this is due to chronic atrial fibrillation. 06/19/2020 upon evaluation today patient's wound is actually showing signs of significant improvement. I am actually extremely pleased with where things stand he is going require little bit of debridement to clear away some of the necrotic debris on the surface of the wound he actually is okay with that he is on Coumadin selective do this carefully but nonetheless I think it is definitely achievable.. 06/26/2020 upon evaluation today patient appears to be doing well in regard to his leg ulcer. I feel like he is showing signs of improvement and overall very pleased in this regard. Unfortunately he did have a fall where he injured his right elbow this is new and subsequently does need to be addressed as well with dressings today. Fortunately it does not appear to be too bad. 07/03/2020 upon evaluation today patient appears to be doing poorly upon inspection today. In fact we did not even get a look at his wounds because he was not acting himself at all. He was very lethargic leading to the left when he came in in his wheelchair. He  did not seem to be extremely responsive which also have me very concerned. His family member who is with him states this has been going on for the last day. With that being said he is not exactly sure what is going on either. Overall the patient does not appear to be in very good shape compared to what I have previously noted. I am not exactly sure what the issue may be but I think he probably needs to be evaluated at the ER to be honest. 07/17/2020 upon evaluation today patient's wound is actually showing signs of good improvement. Is a little bit more  swollen than it was previous but again I think this is secondary to the fact to be honest that he has not had the wrap on for the past week but nonetheless the wound seems to be doing great even so. He is able to get around and walk a little bit better today which is great news as well. I am very pleased in that regard. [name], [name] ([id number]) Nonetheless I do believe that the patient is going to have to go back into the compression wrap I think he was doing a lot better as far as the healing of the wound when he was wrapped. His family members in agreement with that plan today as well. 07/24/2020 upon evaluation today patient actually appears to be doing quite well in regard to his wounds. He has been tolerating the dressing changes without complication and overall I am quite pleased actually with where things stand today. There does not appear to be any evidence of infection which is great news and overall the patient is doing extremely well. 07/31/2020 upon evaluation today patient appears to be doing well with regard to his wound. He has been tolerating the dressing changes without complication and overall I am actually extremely pleased with where things stand today. I do believe that the patient is making excellent progress all things considered here. And his measurements today reflect that. Objective Constitutional Well-nourished and well-hydrated in no acute distress. Vitals Time Taken: 8:24 AM, Height: 71 in, Weight: 209 lbs, BMI: 29.1, Temperature: 98.3 F, Pulse: 102 bpm, Respiratory Rate: 18 breaths/min, Blood Pressure: 159/80 mmHg. Respiratory normal breathing without difficulty. Psychiatric this patient is able to make decisions and demonstrates good insight into disease process. Alert and Oriented x 3. pleasant and cooperative. General Notes: Patient's wound bed showed signs of good granulation epithelization at this point. There does not appear to be any signs of infection  which is great news and overall I am extremely pleased. I do believe that again he is making excellent progress in regard to his wound both in size and overall appearance. Integumentary (Hair, Skin) Wound #5 status is Open. Original cause of wound was Gradually Appeared. The date acquired was: 04/29/2020. The wound has been in treatment 7 weeks. The wound is located on the Right,Lateral Lower Leg. The wound measures 2.5cm length x 0.8cm width x 0.1cm depth; 1.571cm^2 area and 0.157cm^3 volume. There is Fat Layer (Subcutaneous Tissue) exposed. There is no tunneling or undermining noted. There is a medium amount of serosanguineous drainage noted. There is large (67-100%) red granulation within the wound bed. There is no necrotic tissue within the wound bed. Assessment Active Problems ICD-10 Venous insufficiency (chronic) (peripheral) Lymphedema, not elsewhere classified Non-pressure chronic ulcer of other part of right lower leg with fat layer exposed Laceration without foreign body of right elbow, initial encounter Paroxysmal atrial fibrillation Procedures Wound #5 Pre-procedure  diagnosis of Wound #5 is a Venous Leg Ulcer located on the Right,Lateral Lower Leg . There was a Three Layer Compression Therapy Procedure with a pre-treatment ABI of 1.4 by Dolan Amen, RN. Post procedure Diagnosis Wound #5: Same as Pre-Procedure [name], [name] ([id number]) Plan Follow-up Appointments: Return Appointment in 1 week. Bathing/ Shower/ Hygiene: May shower with wound dressing protected with water repellent cover or cast protector. Edema Control - Lymphedema / Segmental Compressive Device / Other: Optional: One layer of unna paste to top of compression wrap (to act as an anchor). Elevate, Exercise Daily and Avoid Standing for Long Periods of Time. Elevate legs to the level of the heart and pump ankles as often as possible Elevate leg(s) parallel to the floor when sitting. WOUND #5: - Lower  Leg Wound Laterality: Right, Lateral Cleanser: Soap and Water 1 x Per Week/15 Days Discharge Instructions: Gently cleanse wound with antibacterial soap, rinse and pat dry prior to dressing wounds Primary Dressing: Xeroform-HBD 2x2 (in/in) 1 x Per Week/15 Days Discharge Instructions: Apply Xeroform double layer to wound bed Secondary Dressing: ABD Pad 5x9 (in/in) 1 x Per Week/15 Days Discharge Instructions: Cover with ABD pad Compression Wrap: Profore Lite LF 3 Multilayer Compression Bandaging System 1 x Per Week/15 Days Discharge Instructions: Apply 3 multi-layer wrap as prescribed. 1. I would recommend currently that we going continue with wound care measures as before. We are using the Xeroform gauze which I think is doing a good job. 2. I am also can recommend ABD pad to cover. 3. Were also going to continue with the 3 layer compression wrap which I think is doing a great job. We will see patient back for reevaluation in 1 week here in the clinic. If anything worsens or changes patient will contact our office for additional recommendations. Electronic Signature(s) Signed: 07/31/2020 6:45:27 PM By: Worthy Keeler PA-C Entered By: Worthy Keeler on 07/31/2020 18:45:27 [name], [name] ([id number]) -------------------------------------------------------------------------------- SuperBill Details Patient Name: [name] Date of Service: 07/31/2020 Medical Record Number: [id number] Patient Account Number: [id number] Date of Birth/Sex: Sep 10, 1930 (87 y.o. M) Treating RN: Dolan Amen Primary Care Provider: Gaynelle Arabian Other Clinician: Referring Provider: Gaynelle Arabian Treating Provider/Extender: Skipper Cliche in Treatment: 7 Diagnosis Coding ICD-10 Codes Code Description I87.2 Venous insufficiency (chronic) (peripheral) I89.0 Lymphedema, not elsewhere classified L97.812 Non-pressure chronic ulcer of other part of right lower leg with fat layer exposed S51.011A  Laceration without foreign body of right elbow, initial encounter I48.0 Paroxysmal atrial fibrillation Facility Procedures CPT4 Code: [id number] Description: (Facility Use Only) [phone] - [facility] RT LEG Modifier: Quantity: 1 Physician Procedures CPT4 [id number] Description: [id number] - WC PHYS LEVEL 3 - EST PT Modifier: Quantity: 1 CPT4 Code: Description: ICD-10 Diagnosis Description I87.2 Venous insufficiency (chronic) (peripheral) I89.0 Lymphedema, not elsewhere classified L97.812 Non-pressure chronic ulcer of other part of right lower leg with fat la S51.011A Laceration without foreign  body of right elbow, initial encounter Modifier: yer exposed Quantity: Electronic Signature(s) Signed: 07/31/2020 6:45:38 PM By: Worthy Keeler PA-C Previous Signature: 07/31/2020 1:24:17 PM Version By: Dolan Amen RN Entered By: Worthy Keeler on 07/31/2020 18:45:38

## 2020-07-31 NOTE — Progress Notes (Signed)
Danny, Mcdonald ([id number]) Visit Report for 07/31/2020 Arrival Information Details Patient Name: [name], [name]. Date of Service: 07/31/2020 8:15 AM Medical Record Number: [id number] Patient Account Number: [id number] Date of Birth/Sex: 29-Apr-1930 (87 y.o. M) Treating RN: Dolan Amen Primary Care [place] Pilley: Gaynelle Arabian Other Clinician: Referring Intisar Claudio: Gaynelle Arabian Treating Jossiah Smoak/Extender: Skipper Cliche in Treatment: 7 Visit Information History Since Last Visit Has Compression in Place as Prescribed: Yes Patient Arrived: Walker Pain Present Now: No Arrival Time: 08:22 Accompanied By: son Transfer Assistance: None Patient Identification Verified: Yes Secondary Verification Process Completed: Yes Patient Requires Transmission-Based No Precautions: Patient Has Alerts: Yes Patient Alerts: Patient on Blood Thinner NOT DIABETIC ***WARFARIN*** Electronic Signature(s) Signed: 07/31/2020 1:24:17 PM By: Dolan Amen RN Entered By: Dolan Amen on 07/31/2020 08:24:25 [name] ([id number]) -------------------------------------------------------------------------------- Clinic Level of Care Assessment Details Patient Name: [name] Date of Service: 07/31/2020 8:15 AM Medical Record Number: [id number] Patient Account Number: [id number] Date of Birth/Sex: 09-20-1930 (87 y.o. M) Treating RN: Dolan Amen Primary Care Monnie Anspach: Gaynelle Arabian Other Clinician: Referring Khalfani Weideman: Gaynelle Arabian Treating Flavius Repsher/Extender: Skipper Cliche in Treatment: 7 Clinic Level of Care Assessment Items TOOL 1 Quantity Score [] - Use when EandM and Procedure is performed on INITIAL visit 0 ASSESSMENTS - Nursing Assessment / Reassessment [] - General Physical Exam (combine w/ comprehensive assessment (listed just below) when performed on new 0 pt. evals) [] - 0 Comprehensive Assessment (HX, ROS, Risk Assessments, Wounds Hx, etc.) ASSESSMENTS - Wound and Skin  Assessment / Reassessment [] - Dermatologic / Skin Assessment (not related to wound area) 0 ASSESSMENTS - Ostomy and/or Continence Assessment and Care [] - Incontinence Assessment and Management 0 [] - 0 Ostomy Care Assessment and Management (repouching, etc.) PROCESS - Coordination of Care [] - Simple Patient / Family Education for ongoing care 0 [] - 0 Complex (extensive) Patient / Family Education for ongoing care [] - 0 Staff obtains Consents, Records, Test Results / Process Orders [] - 0 Staff telephones HHA, Nursing Homes / Clarify orders / etc [] - 0 Routine Transfer to another Facility (non-emergent condition) [] - 0 Routine Hospital Admission (non-emergent condition) [] - 0 New Admissions / Biomedical engineer / Ordering NPWT, Apligraf, etc. [] - 0 Emergency Hospital Admission (emergent condition) PROCESS - Special Needs [] - Pediatric / Minor Patient Management 0 [] - 0 Isolation Patient Management [] - 0 Hearing / Language / Visual special needs [] - 0 Assessment of Community assistance (transportation, D/C planning, etc.) [] - 0 Additional assistance / Altered mentation [] - 0 Support Surface(s) Assessment (bed, cushion, seat, etc.) INTERVENTIONS - Miscellaneous [] - External ear exam 0 [] - 0 Patient Transfer (multiple staff / Civil Service fast streamer / Similar devices) [] - 0 Simple Staple / Suture removal (25 or less) [] - 0 Complex Staple / Suture removal (26 or more) [] - 0 Hypo/Hyperglycemic Management (do not check if billed separately) [] - 0 Ankle / Brachial Index (ABI) - do not check if billed separately Has the patient been seen at the hospital within the last three years: Yes Total Score: 0 Level Of Care: ____ [name] ([id number]) Electronic Signature(s) Signed: 07/31/2020 1:24:17 PM By: Dolan Amen RN Entered By: Dolan Amen on 07/31/2020 09:08:14 [name]  ([id number]) -------------------------------------------------------------------------------- Compression Therapy Details Patient Name: [name] Date of Service: 07/31/2020 8:15 AM Medical Record Number: [id number] Patient Account Number: [id number] Date of Birth/Sex: 02/02/1930 (87 y.o. M) Treating  RN: Dolan Amen Primary Care : Gaynelle Arabian Other Clinician: Referring : Gaynelle Arabian Treating /Extender: Skipper Cliche in Treatment: 7 Compression Therapy Performed for Wound Assessment: Wound #5 Right,Lateral Lower Leg Performed By: Clinician Dolan Amen, RN Compression Type: Three Layer Pre Treatment ABI: 1.4 Post Procedure Diagnosis Same as Pre-procedure Electronic Signature(s) Signed: 07/31/2020 1:24:17 PM By: Dolan Amen RN Entered By: Dolan Amen on 07/31/2020 09:07:19 [name] ([id number]) -------------------------------------------------------------------------------- Encounter Discharge Information Details Patient Name: [name] Date of Service: 07/31/2020 8:15 AM Medical Record Number: [id number] Patient Account Number: [id number] Date of Birth/Sex: [birth year] (87 y.o. M) Treating RN: Dolan Amen Primary Care : Gaynelle Arabian Other Clinician: Referring : Gaynelle Arabian Treating /Extender: Skipper Cliche in Treatment: 7 Encounter Discharge Information Items Discharge Condition: Stable Ambulatory Status: Walker Discharge Destination: Home Transportation: Private Auto Accompanied By: self Schedule Follow-up Appointment: Yes Clinical Summary of Care: Electronic Signature(s) Signed: 07/31/2020 1:24:17 PM By: Dolan Amen RN Entered By: Dolan Amen on 07/31/2020 09:09:01 [name] ([id number]) -------------------------------------------------------------------------------- Lower Extremity Assessment Details Patient Name: [name] Date of Service: 07/31/2020 8:15  AM Medical Record Number: [id number] Patient Account Number: [id number] Date of Birth/Sex: June 28, 1930 (87 y.o. M) Treating RN: Dolan Amen Primary Care : Gaynelle Arabian Other Clinician: Referring : Gaynelle Arabian Treating /Extender: Skipper Cliche in Treatment: 7 Edema Assessment Assessed: [Left: No] [Right: Yes] Edema: [Left: Ye] [Right: s] Calf Left: Right: Point of Measurement: 34 cm From Medial Instep 34.8 cm Ankle Left: Right: Point of Measurement: 10 cm From Medial Instep 24.8 cm Vascular Assessment Pulses: Dorsalis Pedis Palpable: [Right:Yes] Electronic Signature(s) Signed: 07/31/2020 1:24:17 PM By: Dolan Amen RN Entered By: Dolan Amen on 07/31/2020 08:35:43 [name] ([id number]) -------------------------------------------------------------------------------- Multi Wound Chart Details Patient Name: [name] Date of Service: 07/31/2020 8:15 AM Medical Record Number: [id number] Patient Account Number: [id number] Date of Birth/Sex: [birth year] (87 y.o. M) Treating RN: Dolan Amen Primary Care : Gaynelle Arabian Other Clinician: Referring : Gaynelle Arabian Treating /Extender: Skipper Cliche in Treatment: 7 Vital Signs Height(in): 71 Pulse(bpm): 102 Weight(lbs): 209 Blood Pressure(mmHg): 159/80 Body Mass Index(BMI): 29 Temperature(F): 98.3 Respiratory Rate(breaths/min): 18 Photos: [N/A:N/A] Wound Location: Right, Lateral Lower Leg N/A N/A Wounding Event: Gradually Appeared N/A N/A Primary Etiology: Venous Leg Ulcer N/A N/A Comorbid History: Cataracts, Arrhythmia, N/A N/A Hypertension Date Acquired: 04/29/2020 N/A N/A Weeks of Treatment: 7 N/A N/A Wound Status: Open N/A N/A Measurements L x W x D (cm) 2.5x0.8x0.1 N/A N/A Area (cm) : 1.571 N/A N/A Volume (cm) : 0.157 N/A N/A % Reduction in Area: 79.10% N/A N/A % Reduction in Volume: 79.10% N/A N/A Classification: Full Thickness  Without Exposed N/A N/A Support Structures Exudate Amount: Medium N/A N/A Exudate Type: Serosanguineous N/A N/A Exudate Color: red, brown N/A N/A Granulation Amount: Large (67-100%) N/A N/A Granulation Quality: Red N/A N/A Necrotic Amount: None Present (0%) N/A N/A Exposed Structures: Fat Layer (Subcutaneous Tissue): N/A N/A Yes Fascia: No Tendon: No Muscle: No Joint: No Bone: No Epithelialization: Medium (34-66%) N/A N/A Treatment Notes Electronic Signature(s) Signed: 07/31/2020 1:24:17 PM By: Dolan Amen RN Entered By: Dolan Amen on 07/31/2020 09:07:05 [name] ([id number]) -------------------------------------------------------------------------------- [place] Details Patient Name: [name] Date of Service: 07/31/2020 8:15 AM Medical Record Number: [id number] Patient Account Number: [id number] Date of Birth/Sex: [birth year] (87 y.o. M) Treating RN: Dolan Amen Primary Care : Gaynelle Arabian Other Clinician: Referring : Gaynelle Arabian Treating /Extender:  Jeri Cos Weeks in Treatment: 7 Active Inactive Wound/Skin Impairment Nursing Diagnoses: Knowledge deficit related to ulceration/compromised skin integrity Goals: Patient/caregiver will verbalize understanding of skin care regimen Date Initiated: 06/12/2020 Date Inactivated: 07/17/2020 Target Resolution Date: 07/12/2020 Goal Status: Met Ulcer/skin breakdown will have a volume reduction of 30% by week 4 Date Initiated: 06/12/2020 Date Inactivated: 07/17/2020 Target Resolution Date: 07/12/2020 Goal Status: Met Ulcer/skin breakdown will have a volume reduction of 50% by week 8 Date Initiated: 06/12/2020 Target Resolution Date: 08/12/2020 Goal Status: Active Ulcer/skin breakdown will have a volume reduction of 80% by week 12 Date Initiated: 06/12/2020 Target Resolution Date: 09/12/2020 Goal Status: Active Ulcer/skin breakdown will heal within 14 weeks Date  Initiated: 06/12/2020 Target Resolution Date: 10/12/2020 Goal Status: Active Interventions: Assess patient/caregiver ability to obtain necessary supplies Assess patient/caregiver ability to perform ulcer/skin care regimen upon admission and as needed Assess ulceration(s) every visit Notes: Electronic Signature(s) Signed: 07/31/2020 1:24:17 PM By: Dolan Amen RN Entered By: Dolan Amen on 07/31/2020 09:06:58 [name] ([id number]) -------------------------------------------------------------------------------- Pain Assessment Details Patient Name: [name] Date of Service: 07/31/2020 8:15 AM Medical Record Number: [id number] Patient Account Number: [id number] Date of Birth/Sex: [birth year] (87 y.o. M) Treating RN: Dolan Amen Primary Care : Gaynelle Arabian Other Clinician: Referring : Gaynelle Arabian Treating /Extender: Skipper Cliche in Treatment: 7 Active Problems Location of Pain Severity and Description of Pain Patient Has Paino No Site Locations Rate the pain. Current Pain Level: 0 Pain Management and Medication Current Pain Management: Electronic Signature(s) Signed: 07/31/2020 1:24:17 PM By: Dolan Amen RN Entered By: Dolan Amen on 07/31/2020 08:24:53 [name] ([id number]) -------------------------------------------------------------------------------- Patient/Caregiver Education Details Patient Name: [name] Date of Service: 07/31/2020 8:15 AM Medical Record Number: [id number] Patient Account Number: [id number] Date of Birth/Gender: January 01, 1931 (87 y.o. M) Treating RN: Dolan Amen Primary Care Physician: Gaynelle Arabian Other Clinician: Referring Physician: Gaynelle Arabian Treating Physician/Extender: Skipper Cliche in Treatment: 7 Education Assessment Education Provided To: Patient Education Topics Provided Wound/Skin Impairment: Methods: Explain/Verbal Responses: State content  correctly Electronic Signature(s) Signed: 07/31/2020 1:24:17 PM By: Dolan Amen RN Entered By: Dolan Amen on 07/31/2020 09:08:32 [name] ([id number]) -------------------------------------------------------------------------------- Wound Assessment Details Patient Name: [name] Date of Service: 07/31/2020 8:15 AM Medical Record Number: [id number] Patient Account Number: [id number] Date of Birth/Sex: January 04, 1931 (87 y.o. M) Treating RN: Dolan Amen Primary Care : Gaynelle Arabian Other Clinician: Referring : Gaynelle Arabian Treating /Extender: Skipper Cliche in Treatment: 7 Wound Status Wound Number: 5 Primary Etiology: Venous Leg Ulcer Wound Location: Right, Lateral Lower Leg Wound Status: Open Wounding Event: Gradually Appeared Comorbid History: Cataracts, Arrhythmia, Hypertension Date Acquired: 04/29/2020 Weeks Of Treatment: 7 Clustered Wound: No Photos Wound Measurements Length: (cm) 2.5 Width: (cm) 0.8 Depth: (cm) 0.1 Area: (cm) 1.571 Volume: (cm) 0.157 % Reduction in Area: 79.1% % Reduction in Volume: 79.1% Epithelialization: Medium (34-66%) Tunneling: No Undermining: No Wound Description Classification: Full Thickness Without Exposed Support Structures Exudate Amount: Medium Exudate Type: Serosanguineous Exudate Color: red, brown Foul Odor After Cleansing: No Slough/Fibrino Yes Wound Bed Granulation Amount: Large (67-100%) Exposed Structure Granulation Quality: Red Fascia Exposed: No Necrotic Amount: None Present (0%) Fat Layer (Subcutaneous Tissue) Exposed: Yes Tendon Exposed: No Muscle Exposed: No Joint Exposed: No Bone Exposed: No Treatment Notes Wound #5 (Lower Leg) Wound Laterality: Right, Lateral Cleanser Soap and Water Discharge Instruction: Gently cleanse wound with antibacterial soap, rinse and pat dry prior to dressing wounds Peri-Wound Care Medal, Major  JMarland Kitchen ([id number]) Topical Primary  Dressing Xeroform-HBD 2x2 (in/in) Discharge Instruction: Apply Xeroform double layer to wound bed Secondary Dressing ABD Pad 5x9 (in/in) Discharge Instruction: Cover with ABD pad Secured With Compression Wrap Profore Lite LF 3 Multilayer Compression Bandaging System Discharge Instruction: Apply 3 multi-layer wrap as prescribed. Compression Stockings Add-Ons Electronic Signature(s) Signed: 07/31/2020 1:24:17 PM By: Dolan Amen RN Entered By: Dolan Amen on 07/31/2020 08:53:53 [name] ([id number]) -------------------------------------------------------------------------------- Vitals Details Patient Name: [name] Date of Service: 07/31/2020 8:15 AM Medical Record Number: [id number] Patient Account Number: [id number] Date of Birth/Sex: 1930/07/13 (87 y.o. M) Treating RN: Dolan Amen Primary Care : Gaynelle Arabian Other Clinician: Referring : Gaynelle Arabian Treating /Extender: Skipper Cliche in Treatment: 7 Vital Signs Time Taken: 08:24 Temperature (F): 98.3 Height (in): 71 Pulse (bpm): 102 Weight (lbs): 209 Respiratory Rate (breaths/min): 18 Body Mass Index (BMI): 29.1 Blood Pressure (mmHg): 159/80 Reference Range: 80 - 120 mg / dl Electronic Signature(s) Signed: 07/31/2020 1:24:17 PM By: Dolan Amen RN Entered By: Dolan Amen on 07/31/2020 08:24:44

## 2020-08-07 ENCOUNTER — Other Ambulatory Visit: Payer: Self-pay

## 2020-08-07 ENCOUNTER — Encounter: Payer: Medicare Other | Admitting: Physician Assistant

## 2020-08-07 DIAGNOSIS — F17200 Nicotine dependence, unspecified, uncomplicated: Secondary | ICD-10-CM | POA: Diagnosis not present

## 2020-08-07 DIAGNOSIS — I890 Lymphedema, not elsewhere classified: Secondary | ICD-10-CM | POA: Diagnosis not present

## 2020-08-07 DIAGNOSIS — L97812 Non-pressure chronic ulcer of other part of right lower leg with fat layer exposed: Secondary | ICD-10-CM | POA: Diagnosis not present

## 2020-08-07 DIAGNOSIS — S51011A Laceration without foreign body of right elbow, initial encounter: Secondary | ICD-10-CM | POA: Diagnosis not present

## 2020-08-07 DIAGNOSIS — I872 Venous insufficiency (chronic) (peripheral): Secondary | ICD-10-CM | POA: Diagnosis not present

## 2020-08-07 DIAGNOSIS — I10 Essential (primary) hypertension: Secondary | ICD-10-CM | POA: Diagnosis not present

## 2020-08-07 DIAGNOSIS — I480 Paroxysmal atrial fibrillation: Secondary | ICD-10-CM | POA: Diagnosis not present

## 2020-08-07 DIAGNOSIS — Z96641 Presence of right artificial hip joint: Secondary | ICD-10-CM | POA: Diagnosis not present

## 2020-08-07 NOTE — Progress Notes (Addendum)
CHANEY, SY ([id number]) Visit Report for 08/07/2020 Chief Complaint Document Details Patient Name: [name], [name]. Date of Service: 08/07/2020 8:15 AM Medical Record Number: [id number] Patient Account Number: [id number] Date of Birth/Sex: 05-10-1930 (87 y.o. M) Treating RN: Cornell Barman Primary Care Provider: Gaynelle Arabian Other Clinician: Referring Provider: Gaynelle Arabian Treating Provider/Extender: Skipper Cliche in Treatment: 8 Information Obtained from: Patient Chief Complaint Right LE Ulcers Electronic Signature(s) Signed: 08/07/2020 8:17:48 AM By: Worthy Keeler PA-C Entered By: Worthy Keeler on 08/07/2020 08:17:48 [name] ([id number]) -------------------------------------------------------------------------------- HPI Details Patient Name: [name] Date of Service: 08/07/2020 8:15 AM Medical Record Number: [id number] Patient Account Number: [id number] Date of Birth/Sex: 02-Sep-1930 (87 y.o. M) Treating RN: Cornell Barman Primary Care Provider: Gaynelle Arabian Other Clinician: Referring Provider: Gaynelle Arabian Treating Provider/Extender: Skipper Cliche in Treatment: 8 History of Present Illness HPI Description: 87 year old gentleman has been referred to [place] by his PCP Dr. Gaynelle Arabian, for a ulcer on the left posterior lower leg which has been there for about 6 weeks. The patient was thought to have an infection and was put on doxycycline for 14 days and referred to the wound center. He has a past medical history of paroxysmal atrial fibrillation, hypertension, diverticulosis, status post right hip replacement, appendectomy. He has not been a smoker since 63. the anterior shin wound has been more recent about 2 weeks and this was caused probably due to a blunt abrasion. 03/06/17 on evaluation today patient's ulcer appears to show signs of excellent improvement. He is definitely showing additional evidence of granulation which is great news. Overall I'm  extremely pleased with the progress that he has been making from the beginning to where we are right now. With that being said I would like to potentially try different dressing to see if we could stimulate this to heal even faster. 03/13/17 on evaluation today patient appears to be doing little better in regard to his leg ulcer at this time. He has been tolerating the dressing changes without complication. There does not appear to be any evidence of infection which is good news. He does have some additional granulation observed at this point and I do believe that the silver collagen dressing has been of benefit in this regard. Overall I'm very pleased with the progress she's made since last visit the only downside is that he does have additional maceration due to drainage compared to previous we may need to do something to address this. 03/27/17 on evaluation today patient appears to be doing well in regard to his left posterior lower surety ulcer. He has a significant area of epithelial growth which seems to be doing excellent at this point in time. He also seems to be well controlled as far as slough is concerned and drainage there is no evidence of maceration and there really was no slough that could not be cleaned off with saline and gauze. Overall patient seems to be making excellent progress. 04/10/17 on evaluation today patient appears to be doing better in regard to the original ulcer for which we have been treating him. Unfortunately he has two areas where he sustained injury to his posterior leg more proximal to where the current ulceration is. This was due to it appears to be tape and adhesive at this point. When he was using the Allevyn dressing there was no problems however they ran out of these and since that point there seems to have been more irritation around the wound from the  Band-Aids that his wife has been using in their place. Other than this which appear to be very superficial new  ulcers everything appears to be progressing nicely. 04/24/17 on evaluation today patient appears to be doing excellent in regard to his left posterior lower extremity ulcer. He is been tolerating the dressing changes without complication fortunately there does not appear to be any evidence of infection at this point in time. Overall he has made significant improvements since I last saw him. 05/08/17 on evaluation today patient's wound appears to be completely healed at this point. Fortunately this is great news and though it is closed over with epithelium there still some healing to be done underneath for this completely toughen up this was explained to the patient and his wife today as well. Readmission: 06/12/2020 this is a gentleman whom I have previously seen here in the clinic before in regards to similar issues with wounds on his lower extremities. 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 He is also very swollen the skin is tense and even shiny due to the fluid buildup. Obviously I think this is definitely something that we need to address sooner rather than later as far as that is concerned. Fortunately there does not appear to be any signs of active infection he is on a blood thinner, Coumadin, this is due to chronic atrial fibrillation. 06/19/2020 upon evaluation today patient's wound is actually showing signs of significant improvement. I am actually extremely pleased with where things stand he is going require little bit of debridement to clear away some of the necrotic debris on the surface of the wound he actually is okay with that he is on Coumadin selective do this carefully but nonetheless I think it is definitely achievable.. 06/26/2020 upon evaluation today patient appears to be doing well in regard to his leg ulcer. I feel like he is showing signs  of improvement and overall very pleased in this regard. Unfortunately he did have a fall where he injured his right elbow this is new and subsequently does need to be addressed as well with dressings today. Fortunately it does not appear to be too bad. 07/03/2020 upon evaluation today patient appears to be doing poorly upon inspection today. In fact we did not even get a look at his wounds because he was not acting himself at all. He was very lethargic leading to the left when he came in in his wheelchair. He did not seem to be extremely responsive which also have me very concerned. His family member who is with him states this has been going on for the last day. With that being said he is not exactly sure what is going on either. Overall the patient does not appear to be in very good shape compared to what I have previously noted. I am not exactly sure what the issue may be but I think he probably needs to be evaluated at the ER to be honest. 07/17/2020 upon evaluation today patient's wound is actually showing signs of good improvement. Is a little bit more swollen than it was previous but again I think this is secondary to the fact to be honest that he has not had the wrap on for the past week but nonetheless the wound seems to be doing great even so. He is able to get around and walk a little  bit better today which is great news as well. I am very pleased in that regard. Nonetheless I do believe that the patient is going to have to go back into the compression wrap I think he was doing a lot better as far as the healing of the wound when he was wrapped. His family members in agreement with that plan today as well. 07/24/2020 upon evaluation today patient actually appears to be doing quite well in regard to his wounds. He has been tolerating the dressing changes without complication and overall I am quite pleased actually with where things stand today. There does not appear to be any evidence of ERMIL, SAXMAN. ([id number]) infection which is great news and overall the patient is doing extremely well. 07/31/2020 upon evaluation today patient appears to be doing well with regard to his wound. He has been tolerating the dressing changes without complication and overall I am actually extremely pleased with where things stand today. I do believe that the patient is making excellent progress all things considered here. And his measurements today reflect that. 08/07/2020 upon evaluation today patient appears to be doing well with regard to his leg ulcer. Has been tolerating the dressing changes without complication. Fortunately there is no signs of active infection at this time which is great news. No fevers, chills, nausea, vomiting, or diarrhea. Electronic Signature(s) Signed: 08/07/2020 11:14:48 AM By: Worthy Keeler PA-C Entered By: Worthy Keeler on 08/07/2020 11:14:47 [name] ([id number]) -------------------------------------------------------------------------------- Physical Exam Details Patient Name: JAWAUN, SWAN Date of Service: 08/07/2020 8:15 AM Medical Record Number: [id number] Patient Account Number: [id number] Date of Birth/Sex: [birth year] (87 y.o. M) Treating RN: Cornell Barman Primary Care Provider: Gaynelle Arabian Other Clinician: Referring Provider: Gaynelle Arabian Treating Provider/Extender: Skipper Cliche in Treatment: 8 Constitutional Well-nourished and well-hydrated in no acute distress. Respiratory normal breathing without difficulty. Psychiatric this patient is able to make decisions and demonstrates good insight into disease process. Alert and Oriented x 3. pleasant and cooperative. Notes Upon inspection patient's wound bed showed signs of good granulation epithelization at this point. There does not appear to be any signs of infection which is great news and overall I am extremely pleased with where everything stands at this point. Electronic  Signature(s) Signed: 08/07/2020 11:15:07 AM By: Worthy Keeler PA-C Entered By: Worthy Keeler on 08/07/2020 11:15:07 [name] ([id number]) -------------------------------------------------------------------------------- Physician Orders Details Patient Name: [name] Date of Service: 08/07/2020 8:15 AM Medical Record Number: [id number] Patient Account Number: [id number] Date of Birth/Sex: November 20, 1930 (87 y.o. M) Treating RN: Cornell Barman Primary Care Provider: Gaynelle Arabian Other Clinician: Referring Provider: Gaynelle Arabian Treating Provider/Extender: Skipper Cliche in Treatment: 8 Verbal / Phone Orders: No Diagnosis Coding ICD-10 Coding Code Description I87.2 Venous insufficiency (chronic) (peripheral) I89.0 Lymphedema, not elsewhere classified L97.812 Non-pressure chronic ulcer of other part of right lower leg with fat layer exposed S51.011A Laceration without foreign body of right elbow, initial encounter I48.0 Paroxysmal atrial fibrillation Follow-up Appointments o Return Appointment in 1 week. Bathing/ Shower/ Hygiene o May shower with wound dressing protected with water repellent cover or cast protector. Edema Control - Lymphedema / Segmental Compressive Device / Other o Optional: One layer of unna paste to top of compression wrap (to act as an anchor). o Elevate, Exercise Daily and Avoid Standing for Long Periods of Time. o Elevate legs to the level of the heart and pump ankles as often as possible o Elevate leg(s) parallel to the floor  when sitting. Wound Treatment Wound #5 - Lower Leg Wound Laterality: Right, Lateral Cleanser: Soap and Water 1 x Per Week/15 Days Discharge Instructions: Gently cleanse wound with antibacterial soap, rinse and pat dry prior to dressing wounds Primary Dressing: Xeroform-HBD 2x2 (in/in) 1 x Per Week/15 Days Discharge Instructions: Apply Xeroform double layer to wound bed Secondary Dressing: ABD Pad 5x9 (in/in) 1  x Per Week/15 Days Discharge Instructions: Cover with ABD pad Compression Wrap: Profore Lite LF 3 Multilayer Compression Bandaging System 1 x Per Week/15 Days Discharge Instructions: Apply 3 multi-layer wrap as prescribed. Electronic Signature(s) Signed: 08/07/2020 4:21:57 PM By: Worthy Keeler PA-C Signed: 08/12/2020 5:43:31 PM By: Gretta Cool, BSN, RN, CWS, Kim RN, BSN Entered By: Gretta Cool, BSN, RN, CWS, Kim on 08/07/2020 08:55:57 [name] ([id number]) -------------------------------------------------------------------------------- Problem List Details Patient Name: SWAN, HANDZEL Date of Service: 08/07/2020 8:15 AM Medical Record Number: [id number] Patient Account Number: [id number] Date of Birth/Sex: 12-24-1930 (87 y.o. M) Treating RN: Cornell Barman Primary Care Provider: Gaynelle Arabian Other Clinician: Referring Provider: Gaynelle Arabian Treating Provider/Extender: Skipper Cliche in Treatment: 8 Active Problems ICD-10 Encounter Code Description Active Date MDM Diagnosis I87.2 Venous insufficiency (chronic) (peripheral) 06/12/2020 No Yes I89.0 Lymphedema, not elsewhere classified 06/12/2020 No Yes L97.812 Non-pressure chronic ulcer of other part of right lower leg with fat layer 06/12/2020 No Yes exposed S51.011A Laceration without foreign body of right elbow, initial encounter 06/26/2020 No Yes I48.0 Paroxysmal atrial fibrillation 06/12/2020 No Yes Inactive Problems Resolved Problems Electronic Signature(s) Signed: 08/07/2020 8:17:41 AM By: Worthy Keeler PA-C Entered By: Worthy Keeler on 08/07/2020 08:17:40 [name] ([id number]) -------------------------------------------------------------------------------- Progress Note Details Patient Name: [name] Date of Service: 08/07/2020 8:15 AM Medical Record Number: [id number] Patient Account Number: [id number] Date of Birth/Sex: [birth year] (87 y.o. M) Treating RN: Cornell Barman Primary Care Provider: Gaynelle Arabian Other  Clinician: Referring Provider: Gaynelle Arabian Treating Provider/Extender: Skipper Cliche in Treatment: 8 Subjective Chief Complaint Information obtained from Patient Right LE Ulcers History of Present Illness (HPI) 87 year old gentleman has been referred to [place] by his PCP Dr. Gaynelle Arabian, for a ulcer on the left posterior lower leg which has been there for about 6 weeks. The patient was thought to have an infection and was put on doxycycline for 14 days and referred to the wound center. He has a past medical history of paroxysmal atrial fibrillation, hypertension, diverticulosis, status post right hip replacement, appendectomy. He has not been a smoker since 49. the anterior shin wound has been more recent about 2 weeks and this was caused probably due to a blunt abrasion. 03/06/17 on evaluation today patient's ulcer appears to show signs of excellent improvement. He is definitely showing additional evidence of granulation which is great news. Overall I'm extremely pleased with the progress that he has been making from the beginning to where we are right now. With that being said I would like to potentially try different dressing to see if we could stimulate this to heal even faster. 03/13/17 on evaluation today patient appears to be doing little better in regard to his leg ulcer at this time. He has been tolerating the dressing changes without complication. There does not appear to be any evidence of infection which is good news. He does have some additional granulation observed at this point and I do believe that the silver collagen dressing has been of benefit in this regard. Overall I'm very pleased with the progress she's made since last visit the only downside  is that he does have additional maceration due to drainage compared to previous we may need to do something to address this. 03/27/17 on evaluation today patient appears to be doing well in regard to his left posterior lower  surety ulcer. He has a significant area of epithelial growth which seems to be doing excellent at this point in time. He also seems to be well controlled as far as slough is concerned and drainage there is no evidence of maceration and there really was no slough that could not be cleaned off with saline and gauze. Overall patient seems to be making excellent progress. 04/10/17 on evaluation today patient appears to be doing better in regard to the original ulcer for which we have been treating him. Unfortunately he has two areas where he sustained injury to his posterior leg more proximal to where the current ulceration is. This was due to it appears to be tape and adhesive at this point. When he was using the Allevyn dressing there was no problems however they ran out of these and since that point there seems to have been more irritation around the wound from the Band-Aids that his wife has been using in their place. Other than this which appear to be very superficial new ulcers everything appears to be progressing nicely. 04/24/17 on evaluation today patient appears to be doing excellent in regard to his left posterior lower extremity ulcer. He is been tolerating the dressing changes without complication fortunately there does not appear to be any evidence of infection at this point in time. Overall he has made significant improvements since I last saw him. 05/08/17 on evaluation today patient's wound appears to be completely healed at this point. Fortunately this is great news and though it is closed over with epithelium there still some healing to be done underneath for this completely toughen up this was explained to the patient and his wife today as well. Readmission: 06/12/2020 this is a gentleman whom I have previously seen here in the clinic before in regards to similar issues with wounds on his lower extremities. With that being said he presents today with a wound that appears to be a venous leg  ulcer secondary to his chronic venous stasis and lymphedema over the right lateral lower extremity. He is also very swollen the skin is tense and even shiny due to the fluid buildup. Obviously I think this is definitely something that we need to address sooner rather than later as far as that is concerned. Fortunately there does not appear to be any signs of active infection he is on a blood thinner, Coumadin, this is due to chronic atrial fibrillation. 06/19/2020 upon evaluation today patient's wound is actually showing signs of significant improvement. I am actually extremely pleased with where things stand he is going require little bit of debridement to clear away some of the necrotic debris on the surface of the wound he actually is okay with that he is on Coumadin selective do this carefully but nonetheless I think it is definitely achievable.. 06/26/2020 upon evaluation today patient appears to be doing well in regard to his leg ulcer. I feel like he is showing signs of improvement and overall very pleased in this regard. Unfortunately he did have a fall where he injured his right elbow this is new and subsequently does need to be addressed as well with dressings today. Fortunately it does not appear to be too bad. 07/03/2020 upon evaluation today patient appears to be doing poorly  upon inspection today. In fact we did not even get a look at his wounds because he was not acting himself at all. He was very lethargic leading to the left when he came in in his wheelchair. He did not seem to be extremely responsive which also have me very concerned. His family member who is with him states this has been going on for the last day. With that being said he is not exactly sure what is going on either. Overall the patient does not appear to be in very good shape compared to what I have previously noted. I am not exactly sure what the issue may be but I think he probably needs to be evaluated at the ER to be  honest. 07/17/2020 upon evaluation today patient's wound is actually showing signs of good improvement. Is a little bit more swollen than it was previous but again I think this is secondary to the fact to be honest that he has not had the wrap on for the past week but nonetheless the wound seems to be doing great even so. He is able to get around and walk a little bit better today which is great news as well. I am very pleased in that regard. CHAVON, HOH ([id number]) Nonetheless I do believe that the patient is going to have to go back into the compression wrap I think he was doing a lot better as far as the healing of the wound when he was wrapped. His family members in agreement with that plan today as well. 07/24/2020 upon evaluation today patient actually appears to be doing quite well in regard to his wounds. He has been tolerating the dressing changes without complication and overall I am quite pleased actually with where things stand today. There does not appear to be any evidence of infection which is great news and overall the patient is doing extremely well. 07/31/2020 upon evaluation today patient appears to be doing well with regard to his wound. He has been tolerating the dressing changes without complication and overall I am actually extremely pleased with where things stand today. I do believe that the patient is making excellent progress all things considered here. And his measurements today reflect that. 08/07/2020 upon evaluation today patient appears to be doing well with regard to his leg ulcer. Has been tolerating the dressing changes without complication. Fortunately there is no signs of active infection at this time which is great news. No fevers, chills, nausea, vomiting, or diarrhea. Objective Constitutional Well-nourished and well-hydrated in no acute distress. Vitals Time Taken: 8:22 AM, Height: 71 in, Weight: 209 lbs, BMI: 29.1, Temperature: 97.8 F, Pulse: 111 bpm,  Respiratory Rate: 18 breaths/min, Blood Pressure: 147/70 mmHg. Respiratory normal breathing without difficulty. Psychiatric this patient is able to make decisions and demonstrates good insight into disease process. Alert and Oriented x 3. pleasant and cooperative. General Notes: Upon inspection patient's wound bed showed signs of good granulation epithelization at this point. There does not appear to be any signs of infection which is great news and overall I am extremely pleased with where everything stands at this point. Integumentary (Hair, Skin) Wound #5 status is Open. Original cause of wound was Gradually Appeared. The date acquired was: 04/29/2020. The wound has been in treatment 8 weeks. The wound is located on the Right,Lateral Lower Leg. The wound measures 2.4cm length x 1.2cm width x 0.2cm depth; 2.262cm^2 area and 0.452cm^3 volume. There is Fat Layer (Subcutaneous Tissue) exposed. There is no tunneling  or undermining noted. There is a medium amount of serosanguineous drainage noted. The wound margin is flat and intact. There is large (67-100%) red granulation within the wound bed. There is a small (1-33%) amount of necrotic tissue within the wound bed including Adherent Slough. Assessment Active Problems ICD-10 Venous insufficiency (chronic) (peripheral) Lymphedema, not elsewhere classified Non-pressure chronic ulcer of other part of right lower leg with fat layer exposed Laceration without foreign body of right elbow, initial encounter Paroxysmal atrial fibrillation Procedures Wound #5 Pre-procedure diagnosis of Wound #5 is a Venous Leg Ulcer located on the Right,Lateral Lower Leg . There was a Three Layer Compression Therapy Procedure with a pre-treatment ABI of 1.4 by Cornell Barman, RN. Post procedure Diagnosis Wound #5: Same as Pre-Procedure CLARICE, LEER ([id number]) Plan Follow-up Appointments: Return Appointment in 1 week. Bathing/ Shower/ Hygiene: May shower with  wound dressing protected with water repellent cover or cast protector. Edema Control - Lymphedema / Segmental Compressive Device / Other: Optional: One layer of unna paste to top of compression wrap (to act as an anchor). Elevate, Exercise Daily and Avoid Standing for Long Periods of Time. Elevate legs to the level of the heart and pump ankles as often as possible Elevate leg(s) parallel to the floor when sitting. WOUND #5: - Lower Leg Wound Laterality: Right, Lateral Cleanser: Soap and Water 1 x Per Week/15 Days Discharge Instructions: Gently cleanse wound with antibacterial soap, rinse and pat dry prior to dressing wounds Primary Dressing: Xeroform-HBD 2x2 (in/in) 1 x Per Week/15 Days Discharge Instructions: Apply Xeroform double layer to wound bed Secondary Dressing: ABD Pad 5x9 (in/in) 1 x Per Week/15 Days Discharge Instructions: Cover with ABD pad Compression Wrap: Profore Lite LF 3 Multilayer Compression Bandaging System 1 x Per Week/15 Days Discharge Instructions: Apply 3 multi-layer wrap as prescribed. 1. Would recommend currently that we going continue with the wound care measures as before and the patient is in agreement with the plan. This includes the use of the Xeroform gauze which I think is doing a great job. 2. Also can recommend an ABD pad to cover followed by 3 layer compression wrap which seems to be doing extremely well for him. We will see patient back for reevaluation in 1 week here in the clinic. If anything worsens or changes patient will contact our office for additional recommendations. Electronic Signature(s) Signed: 08/07/2020 11:15:37 AM By: Worthy Keeler PA-C Entered By: Worthy Keeler on 08/07/2020 11:15:37 [name] ([id number]) -------------------------------------------------------------------------------- SuperBill Details Patient Name: [name] Date of Service: 08/07/2020 Medical Record Number: [id number] Patient Account Number:  [id number] Date of Birth/Sex: 1930-08-11 (87 y.o. M) Treating RN: Cornell Barman Primary Care Provider: Gaynelle Arabian Other Clinician: Referring Provider: Gaynelle Arabian Treating Provider/Extender: Skipper Cliche in Treatment: 8 Diagnosis Coding ICD-10 Codes Code Description I87.2 Venous insufficiency (chronic) (peripheral) I89.0 Lymphedema, not elsewhere classified L97.812 Non-pressure chronic ulcer of other part of right lower leg with fat layer exposed S51.011A Laceration without foreign body of right elbow, initial encounter I48.0 Paroxysmal atrial fibrillation Facility Procedures CPT4 Code: [id number] Description: (Facility Use Only) [phone] - APPLY MULTLAY COMPRS LWR RT LEG Modifier: Quantity: 1 Physician Procedures CPT4 [id number] Description: [id number] - WC PHYS LEVEL 3 - EST PT Modifier: Quantity: 1 CPT4 Code: Description: ICD-10 Diagnosis Description I87.2 Venous insufficiency (chronic) (peripheral) I89.0 Lymphedema, not elsewhere classified L97.812 Non-pressure chronic ulcer of other part of right lower leg with fat la S51.011A Laceration without foreign  body of right elbow, initial encounter  Modifier: yer exposed Quantity: Electronic Signature(s) Signed: 08/07/2020 11:15:53 AM By: Worthy Keeler PA-C Entered By: Worthy Keeler on 08/07/2020 11:15:53

## 2020-08-10 NOTE — Progress Notes (Addendum)
ELMOR, KOST ([id number]) Visit Report for 08/07/2020 Arrival Information Details Patient Name: [name], [name]. Date of Service: 08/07/2020 8:15 AM Medical Record Number: [id number] Patient Account Number: [id number] Date of Birth/Sex: 07/01/1930 (87 y.o. M) Treating RN: Cornell Barman Primary Care Ovila Lepage: Gaynelle Arabian Other Clinician: Referring Halana Deisher: Gaynelle Arabian Treating Gregorey Nabor/Extender: Skipper Cliche in Treatment: 8 Visit Information History Since Last Visit Added or deleted any medications: No Patient Arrived: Walker Has Dressing in Place as Prescribed: Yes Arrival Time: 08:21 Has Compression in Place as Prescribed: Yes Accompanied By: son Pain Present Now: No Transfer Assistance: None Patient Identification Verified: Yes Secondary Verification Process Completed: Yes Patient Requires Transmission-Based No Precautions: Patient Has Alerts: Yes Patient Alerts: Patient on Blood Thinner NOT DIABETIC ***WARFARIN*** Electronic Signature(s) Signed: 08/12/2020 5:43:31 PM By: Gretta Cool, BSN, RN, CWS, Kim RN, BSN Entered By: Gretta Cool, BSN, RN, CWS, Kim on 08/07/2020 08:22:06 [name] ([id number]) -------------------------------------------------------------------------------- Compression Therapy Details Patient Name: [name] Date of Service: 08/07/2020 8:15 AM Medical Record Number: [id number] Patient Account Number: [id number] Date of Birth/Sex: Feb 20, 1930 (87 y.o. M) Treating RN: Cornell Barman Primary Care Blanton Kardell: Gaynelle Arabian Other Clinician: Referring Azhia Siefken: Gaynelle Arabian Treating Astra Gregg/Extender: Skipper Cliche in Treatment: 8 Compression Therapy Performed for Wound Assessment: Wound #5 Right,Lateral Lower Leg Performed By: Clinician Cornell Barman, RN Compression Type: Three Layer Pre Treatment ABI: 1.4 Post Procedure Diagnosis Same as Pre-procedure Electronic Signature(s) Signed: 08/12/2020 5:43:31 PM By: Gretta Cool, BSN, RN, CWS, Kim RN,  BSN Entered By: Gretta Cool, BSN, RN, CWS, Kim on 08/07/2020 08:44:56 [name] ([id number]) -------------------------------------------------------------------------------- Encounter Discharge Information Details Patient Name: [name], [name]. Date of Service: 08/07/2020 8:15 AM Medical Record Number: [id number] Patient Account Number: [id number] Date of Birth/Sex: 08-28-1930 (87 y.o. M) Treating RN: Cornell Barman Primary Care Dalaney Needle: Gaynelle Arabian Other Clinician: Referring Deisy Ozbun: Gaynelle Arabian Treating Reniyah Gootee/Extender: Skipper Cliche in Treatment: 8 Encounter Discharge Information Items Discharge Condition: Stable Ambulatory Status: Walker Discharge Destination: Home Transportation: Private Auto Accompanied By: son Schedule Follow-up Appointment: Yes Clinical Summary of Care: Electronic Signature(s) Signed: 08/12/2020 5:43:31 PM By: Gretta Cool, BSN, RN, CWS, Kim RN, BSN Entered By: Gretta Cool, BSN, RN, CWS, Kim on 08/07/2020 08:57:04 [name] ([id number]) -------------------------------------------------------------------------------- Lower Extremity Assessment Details Patient Name: [name], [name]. Date of Service: 08/07/2020 8:15 AM Medical Record Number: [id number] Patient Account Number: [id number] Date of Birth/Sex: Feb 06, 1930 (87 y.o. M) Treating RN: Cornell Barman Primary Care Ralphie Lovelady: Gaynelle Arabian Other Clinician: Referring Kazuo Durnil: Gaynelle Arabian Treating Heli Dino/Extender: Skipper Cliche in Treatment: 8 Edema Assessment Assessed: [Left: No] [Right: No] [Left: Edema] [Right: :] Calf Left: Right: Point of Measurement: 34 cm From Medial Instep 34.5 cm Ankle Left: Right: Point of Measurement: 10 cm From Medial Instep 24 cm Knee To Floor Left: Right: From Medial Instep 42 cm Vascular Assessment Pulses: Dorsalis Pedis Palpable: [Right:Yes] Posterior Tibial Palpable: [Right:Yes] Electronic Signature(s) Signed: 08/12/2020 5:43:31 PM By: Gretta Cool, BSN, RN, CWS,  Kim RN, BSN Entered By: Gretta Cool, BSN, RN, CWS, Kim on 08/07/2020 08:31:24 [name] ([id number]) -------------------------------------------------------------------------------- Multi Wound Chart Details Patient Name: [name] Date of Service: 08/07/2020 8:15 AM Medical Record Number: [id number] Patient Account Number: [id number] Date of Birth/Sex: 1930-07-23 (87 y.o. M) Treating RN: Cornell Barman Primary Care Pebbles Zeiders: Gaynelle Arabian Other Clinician: Referring Erum Cercone: Gaynelle Arabian Treating Ivianna Notch/Extender: Skipper Cliche in Treatment: 8 Vital Signs Height(in): 71 Pulse(bpm): 111 Weight(lbs): 209 Blood Pressure(mmHg): 147/70 Body Mass Index(BMI): 29 Temperature(F): 97.8 Respiratory Rate(breaths/min): 18 Photos: [N/A:N/A] Wound Location: Right, Lateral  Lower Leg N/A N/A Wounding Event: Gradually Appeared N/A N/A Primary Etiology: Venous Leg Ulcer N/A N/A Comorbid History: Cataracts, Arrhythmia, N/A N/A Hypertension Date Acquired: 04/29/2020 N/A N/A Weeks of Treatment: 8 N/A N/A Wound Status: Open N/A N/A Measurements L x W x D (cm) 2.4x1.2x0.2 N/A N/A Area (cm) : 2.262 N/A N/A Volume (cm) : 0.452 N/A N/A % Reduction in Area: 69.90% N/A N/A % Reduction in Volume: 39.90% N/A N/A Classification: Full Thickness Without Exposed N/A N/A Support Structures Exudate Amount: Medium N/A N/A Exudate Type: Serosanguineous N/A N/A Exudate Color: red, brown N/A N/A Wound Margin: Flat and Intact N/A N/A Granulation Amount: Large (67-100%) N/A N/A Granulation Quality: Red N/A N/A Necrotic Amount: Small (1-33%) N/A N/A Exposed Structures: Fat Layer (Subcutaneous Tissue): N/A N/A Yes Fascia: No Tendon: No Muscle: No Joint: No Bone: No Epithelialization: Medium (34-66%) N/A N/A Treatment Notes Electronic Signature(s) Signed: 08/12/2020 5:43:31 PM By: Gretta Cool, BSN, RN, CWS, Kim RN, BSN Entered By: Gretta Cool, BSN, RN, CWS, Kim on 08/07/2020 08:33:10 [name]  ([id number]) -------------------------------------------------------------------------------- Multi-Disciplinary Care Plan Details Patient Name: [name], [name]. Date of Service: 08/07/2020 8:15 AM Medical Record Number: [id number] Patient Account Number: [id number] Date of Birth/Sex: Feb 01, 1930 (87 y.o. M) Treating RN: Cornell Barman Primary Care Bryce Kimble: Gaynelle Arabian Other Clinician: Referring Keawe Marcello: Gaynelle Arabian Treating Saul Fabiano/Extender: Skipper Cliche in Treatment: 8 Active Inactive Wound/Skin Impairment Nursing Diagnoses: Knowledge deficit related to ulceration/compromised skin integrity Goals: Patient/caregiver will verbalize understanding of skin care regimen Date Initiated: 06/12/2020 Date Inactivated: 07/17/2020 Target Resolution Date: 07/12/2020 Goal Status: Met Ulcer/skin breakdown will have a volume reduction of 30% by week 4 Date Initiated: 06/12/2020 Date Inactivated: 07/17/2020 Target Resolution Date: 07/12/2020 Goal Status: Met Ulcer/skin breakdown will have a volume reduction of 50% by week 8 Date Initiated: 06/12/2020 Target Resolution Date: 08/12/2020 Goal Status: Active Ulcer/skin breakdown will have a volume reduction of 80% by week 12 Date Initiated: 06/12/2020 Target Resolution Date: 09/12/2020 Goal Status: Active Ulcer/skin breakdown will heal within 14 weeks Date Initiated: 06/12/2020 Target Resolution Date: 10/12/2020 Goal Status: Active Interventions: Assess patient/caregiver ability to obtain necessary supplies Assess patient/caregiver ability to perform ulcer/skin care regimen upon admission and as needed Assess ulceration(s) every visit Notes: Electronic Signature(s) Signed: 08/12/2020 5:43:31 PM By: Gretta Cool, BSN, RN, CWS, Kim RN, BSN Entered By: Gretta Cool, BSN, RN, CWS, Kim on 08/07/2020 08:33:03 [name] ([id number]) -------------------------------------------------------------------------------- Pain Assessment Details Patient Name: [name] Date of Service: 08/07/2020 8:15 AM Medical Record Number: [id number] Patient Account Number: [id number] Date of Birth/Sex: January 03, 1931 (87 y.o. M) Treating RN: Cornell Barman Primary Care Hester Joslin: Gaynelle Arabian Other Clinician: Referring Raydon Chappuis: Gaynelle Arabian Treating Reagann Dolce/Extender: Skipper Cliche in Treatment: 8 Active Problems Location of Pain Severity and Description of Pain Patient Has Paino No Site Locations Pain Management and Medication Current Pain Management: Notes PAtient denies pain at this time. Electronic Signature(s) Signed: 08/12/2020 5:43:31 PM By: Gretta Cool, BSN, RN, CWS, Kim RN, BSN Entered By: Gretta Cool, BSN, RN, CWS, Kim on 08/07/2020 08:22:46 [name] ([id number]) -------------------------------------------------------------------------------- Patient/Caregiver Education Details Patient Name: [name], [name] Date of Service: 08/07/2020 8:15 AM Medical Record Number: [id number] Patient Account Number: [id number] Date of Birth/Gender: 1930/11/09 (87 y.o. M) Treating RN: Cornell Barman Primary Care Physician: Gaynelle Arabian Other Clinician: Referring Physician: Gaynelle Arabian Treating Physician/Extender: Skipper Cliche in Treatment: 8 Education Assessment Education Provided To: Patient Education Topics Provided Venous: Handouts: Controlling Swelling with Multilayered Compression Wraps Methods: Demonstration, Explain/Verbal Responses: State content correctly Electronic Signature(s)  Signed: 08/12/2020 5:43:31 PM By: Gretta Cool, BSN, RN, CWS, Kim RN, BSN Entered By: Gretta Cool, BSN, RN, CWS, Kim on 08/07/2020 08:56:27 [name] ([id number]) -------------------------------------------------------------------------------- Wound Assessment Details Patient Name: [name], [name]. Date of Service: 08/07/2020 8:15 AM Medical Record Number: [id number] Patient Account Number: [id number] Date of Birth/Sex: [birth year] (87 y.o. M) Treating RN: Cornell Barman Primary Care  Eluterio Seymour: Gaynelle Arabian Other Clinician: Referring Mackinley Kiehn: Gaynelle Arabian Treating Duayne Brideau/Extender: Skipper Cliche in Treatment: 8 Wound Status Wound Number: 5 Primary Etiology: Venous Leg Ulcer Wound Location: Right, Lateral Lower Leg Wound Status: Open Wounding Event: Gradually Appeared Comorbid History: Cataracts, Arrhythmia, Hypertension Date Acquired: 04/29/2020 Weeks Of Treatment: 8 Clustered Wound: No Photos Wound Measurements Length: (cm) 2.4 Width: (cm) 1.2 Depth: (cm) 0.2 Area: (cm) 2.262 Volume: (cm) 0.452 % Reduction in Area: 69.9% % Reduction in Volume: 39.9% Epithelialization: Medium (34-66%) Tunneling: No Undermining: No Wound Description Classification: Full Thickness Without Exposed Support Structures Wound Margin: Flat and Intact Exudate Amount: Medium Exudate Type: Serosanguineous Exudate Color: red, brown Foul Odor After Cleansing: No Slough/Fibrino Yes Wound Bed Granulation Amount: Large (67-100%) Exposed Structure Granulation Quality: Red Fascia Exposed: No Necrotic Amount: Small (1-33%) Fat Layer (Subcutaneous Tissue) Exposed: Yes Necrotic Quality: Adherent Slough Tendon Exposed: No Muscle Exposed: No Joint Exposed: No Bone Exposed: No Treatment Notes Wound #5 (Lower Leg) Wound Laterality: Right, Lateral Cleanser Soap and Water Discharge Instruction: Gently cleanse wound with antibacterial soap, rinse and pat dry prior to dressing wounds Peri-Wound Care EDOUARD, GIKAS ([id number]) Topical Primary Dressing Xeroform-HBD 2x2 (in/in) Discharge Instruction: Apply Xeroform double layer to wound bed Secondary Dressing ABD Pad 5x9 (in/in) Discharge Instruction: Cover with ABD pad Secured With Compression Wrap Profore Lite LF 3 Multilayer Compression Bandaging System Discharge Instruction: Apply 3 multi-layer wrap as prescribed. Compression Stockings Environmental education officer) Signed: 08/12/2020 5:43:31 PM By: Gretta Cool, BSN,  RN, CWS, Kim RN, BSN Entered By: Gretta Cool, BSN, RN, CWS, Kim on 08/07/2020 08:29:59 [name] ([id number]) -------------------------------------------------------------------------------- Vitals Details Patient Name: [name] Date of Service: 08/07/2020 8:15 AM Medical Record Number: [id number] Patient Account Number: [id number] Date of Birth/Sex: [birth year] (87 y.o. M) Treating RN: Cornell Barman Primary Care Jeramie Scogin: Gaynelle Arabian Other Clinician: Referring Veralyn Lopp: Gaynelle Arabian Treating Larz Mark/Extender: Skipper Cliche in Treatment: 8 Vital Signs Time Taken: 08:22 Temperature (F): 97.8 Height (in): 71 Pulse (bpm): 111 Weight (lbs): 209 Respiratory Rate (breaths/min): 18 Body Mass Index (BMI): 29.1 Blood Pressure (mmHg): 147/70 Reference Range: 80 - 120 mg / dl Electronic Signature(s) Signed: 08/12/2020 5:43:31 PM By: Gretta Cool, BSN, RN, CWS, Kim RN, BSN Entered By: Gretta Cool, BSN, RN, CWS, Kim on 08/07/2020 59:93:57

## 2020-08-12 ENCOUNTER — Other Ambulatory Visit: Payer: Self-pay

## 2020-08-12 ENCOUNTER — Ambulatory Visit: Payer: Medicare Other

## 2020-08-12 DIAGNOSIS — Z5181 Encounter for therapeutic drug level monitoring: Secondary | ICD-10-CM | POA: Diagnosis not present

## 2020-08-12 DIAGNOSIS — I6789 Other cerebrovascular disease: Secondary | ICD-10-CM | POA: Diagnosis not present

## 2020-08-12 DIAGNOSIS — I4891 Unspecified atrial fibrillation: Secondary | ICD-10-CM

## 2020-08-12 LAB — POCT INR: INR: 2.6 (ref 2.0–3.0)

## 2020-08-12 NOTE — Patient Instructions (Signed)
Description   Continue taking Warfarin 1 tablet daily except 1.5 tablets on Mondays. Recheck in 4 weeks. Call us with any medication changes [phone]

## 2020-08-18 ENCOUNTER — Other Ambulatory Visit: Payer: Self-pay

## 2020-08-18 ENCOUNTER — Encounter: Payer: Medicare Other | Attending: Physician Assistant

## 2020-08-18 DIAGNOSIS — I872 Venous insufficiency (chronic) (peripheral): Secondary | ICD-10-CM | POA: Diagnosis not present

## 2020-08-18 DIAGNOSIS — X58XXXA Exposure to other specified factors, initial encounter: Secondary | ICD-10-CM | POA: Insufficient documentation

## 2020-08-18 DIAGNOSIS — I890 Lymphedema, not elsewhere classified: Secondary | ICD-10-CM | POA: Insufficient documentation

## 2020-08-18 DIAGNOSIS — S51011A Laceration without foreign body of right elbow, initial encounter: Secondary | ICD-10-CM | POA: Insufficient documentation

## 2020-08-18 DIAGNOSIS — I480 Paroxysmal atrial fibrillation: Secondary | ICD-10-CM | POA: Diagnosis not present

## 2020-08-18 DIAGNOSIS — Z7901 Long term (current) use of anticoagulants: Secondary | ICD-10-CM | POA: Insufficient documentation

## 2020-08-18 DIAGNOSIS — L97812 Non-pressure chronic ulcer of other part of right lower leg with fat layer exposed: Secondary | ICD-10-CM | POA: Insufficient documentation

## 2020-08-18 NOTE — Progress Notes (Signed)
CHRISTOPER, NOGUERAS ([id number]) Visit Report for 08/18/2020 Arrival Information Details Patient Name: [name], [name]. Date of Service: 08/18/2020 8:45 AM Medical Record Number: [id number] Patient Account Number: [id number] Date of Birth/Sex: 1930/12/06 (87 y.o. M) Treating RN: Dolan Amen Primary Care Donovyn Guidice: Gaynelle Arabian Other Clinician: Referring Tannis Burstein: Gaynelle Arabian Treating Levy Wellman/Extender: Skipper Cliche in Treatment: 9 Visit Information History Since Last Visit Pain Present Now: No Patient Arrived: Walker Arrival Time: 08:30 Accompanied By: son Transfer Assistance: None Patient Identification Verified: Yes Secondary Verification Process Completed: Yes Patient Requires Transmission-Based No Precautions: Patient Has Alerts: Yes Patient Alerts: Patient on Blood Thinner NOT DIABETIC ***WARFARIN*** Electronic Signature(s) Signed: 08/18/2020 1:23:29 PM By: Dolan Amen RN Entered By: Dolan Amen on 08/18/2020 08:50:37 [name] ([id number]) -------------------------------------------------------------------------------- Clinic Level of Care Assessment Details Patient Name: [name] Date of Service: 08/18/2020 8:45 AM Medical Record Number: [id number] Patient Account Number: [id number] Date of Birth/Sex: 1930/08/05 (87 y.o. M) Treating RN: Dolan Amen Primary Care Meher Kucinski: Gaynelle Arabian Other Clinician: Referring Lachanda Buczek: Gaynelle Arabian Treating Violetta Lavalle/Extender: Skipper Cliche in Treatment: 9 Clinic Level of Care Assessment Items TOOL 1 Quantity Score '[]'$  - Use when EandM and Procedure is performed on INITIAL visit 0 ASSESSMENTS - Nursing Assessment / Reassessment '[]'$  - General Physical Exam (combine w/ comprehensive assessment (listed just below) when performed on new 0 pt. evals) '[]'$  - 0 Comprehensive Assessment (HX, ROS, Risk Assessments, Wounds Hx, etc.) ASSESSMENTS - Wound and Skin Assessment / Reassessment '[]'$  - Dermatologic /  Skin Assessment (not related to wound area) 0 ASSESSMENTS - Ostomy and/or Continence Assessment and Care '[]'$  - Incontinence Assessment and Management 0 '[]'$  - 0 Ostomy Care Assessment and Management (repouching, etc.) PROCESS - Coordination of Care '[]'$  - Simple Patient / Family Education for ongoing care 0 '[]'$  - 0 Complex (extensive) Patient / Family Education for ongoing care '[]'$  - 0 Staff obtains Programmer, systems, Records, Test Results / Process Orders '[]'$  - 0 Staff telephones HHA, Nursing Homes / Clarify orders / etc '[]'$  - 0 Routine Transfer to another Facility (non-emergent condition) '[]'$  - 0 Routine Hospital Admission (non-emergent condition) '[]'$  - 0 New Admissions / Biomedical engineer / Ordering NPWT, Apligraf, etc. '[]'$  - 0 Emergency Hospital Admission (emergent condition) PROCESS - Special Needs '[]'$  - Pediatric / Minor Patient Management 0 '[]'$  - 0 Isolation Patient Management '[]'$  - 0 Hearing / Language / Visual special needs '[]'$  - 0 Assessment of Community assistance (transportation, D/C planning, etc.) '[]'$  - 0 Additional assistance / Altered mentation '[]'$  - 0 Support Surface(s) Assessment (bed, cushion, seat, etc.) INTERVENTIONS - Miscellaneous '[]'$  - External ear exam 0 '[]'$  - 0 Patient Transfer (multiple staff / Civil Service fast streamer / Similar devices) '[]'$  - 0 Simple Staple / Suture removal (25 or less) '[]'$  - 0 Complex Staple / Suture removal (26 or more) '[]'$  - 0 Hypo/Hyperglycemic Management (do not check if billed separately) '[]'$  - 0 Ankle / Brachial Index (ABI) - do not check if billed separately Has the patient been seen at the hospital within the last three years: Yes Total Score: 0 Level Of Care: ____ [name] ([id number]) Electronic Signature(s) Signed: 08/18/2020 1:23:29 PM By: Dolan Amen RN Entered By: Dolan Amen on 08/18/2020 08:53:52 [name] ([id number]) -------------------------------------------------------------------------------- Compression Therapy  Details Patient Name: [name] Date of Service: 08/18/2020 8:45 AM Medical Record Number: [id number] Patient Account Number: [id number] Date of Birth/Sex: 10/15/1930 (87 y.o. M) Treating RN: Dolan Amen Primary Care Adian Jablonowski: Marisue Humble,  Herbie Baltimore Other Clinician: Referring Rue Tinnel: Gaynelle Arabian Treating Deondre Marinaro/Extender: Skipper Cliche in Treatment: 9 Compression Therapy Performed for Wound Assessment: Wound #5 Right,Lateral Lower Leg Performed By: Cora Daniels, RN Compression Type: Three Layer Pre Treatment ABI: 1.4 Electronic Signature(s) Signed: 08/18/2020 1:23:29 PM By: Dolan Amen RN Entered By: Dolan Amen on 08/18/2020 08:50:57 [name] ([id number]) -------------------------------------------------------------------------------- Encounter Discharge Information Details Patient Name: [name] Date of Service: 08/18/2020 8:45 AM Medical Record Number: [id number] Patient Account Number: [id number] Date of Birth/Sex: 01/04/1931 (87 y.o. M) Treating RN: Dolan Amen Primary Care Mechelle Pates: Gaynelle Arabian Other Clinician: Referring Victormanuel Mclure: Gaynelle Arabian Treating Lahna Nath/Extender: Skipper Cliche in Treatment: 9 Encounter Discharge Information Items Discharge Condition: Stable Ambulatory Status: Walker Discharge Destination: Home Transportation: Private Auto Accompanied By: son Schedule Follow-up Appointment: Yes Clinical Summary of Care: Electronic Signature(s) Signed: 08/18/2020 1:23:29 PM By: Dolan Amen RN Entered By: Dolan Amen on 08/18/2020 08:53:48 [name] ([id number]) -------------------------------------------------------------------------------- Wound Assessment Details Patient Name: [name] Date of Service: 08/18/2020 8:45 AM Medical Record Number: [id number] Patient Account Number: [id number] Date of Birth/Sex: 1930-10-28 (87 y.o. M) Treating RN: Dolan Amen Primary Care Aubriel Khanna: Gaynelle Arabian Other Clinician: Referring Karnisha Lefebre: Gaynelle Arabian Treating Josef Tourigny/Extender: Skipper Cliche in Treatment: 9 Wound Status Wound Number: 5 Primary Etiology: Venous Leg Ulcer Wound Location: Right, Lateral Lower Leg Wound Status: Open Wounding Event: Gradually Appeared Date Acquired: 04/29/2020 Weeks Of Treatment: 9 Clustered Wound: No Wound Measurements Length: (cm) 2.4 Width: (cm) 1.2 Depth: (cm) 0.2 Area: (cm) 2.262 Volume: (cm) 0.452 % Reduction in Area: 69.9% % Reduction in Volume: 39.9% Wound Description Classification: Full Thickness Without Exposed Support Structu Exudate Amount: Medium Exudate Type: Serosanguineous Exudate Color: red, brown res Treatment Notes Wound #5 (Lower Leg) Wound Laterality: Right, Lateral Cleanser Soap and Water Discharge Instruction: Gently cleanse wound with antibacterial soap, rinse and pat dry prior to dressing wounds Peri-Wound Care Topical Primary Dressing Xeroform-HBD 2x2 (in/in) Discharge Instruction: Apply Xeroform double layer to wound bed Secondary Dressing ABD Pad 5x9 (in/in) Discharge Instruction: Cover with ABD pad Secured With Compression Wrap Profore Lite LF 3 Multilayer Compression Bandaging System Discharge Instruction: Apply 3 multi-layer wrap as prescribed. Compression Stockings Add-Ons Electronic Signature(s) Signed: 08/18/2020 1:23:29 PM By: Dolan Amen RN Entered By: Dolan Amen on 08/18/2020 08:50:44

## 2020-08-21 ENCOUNTER — Other Ambulatory Visit: Payer: Self-pay

## 2020-08-21 ENCOUNTER — Encounter: Payer: Medicare Other | Admitting: Physician Assistant

## 2020-08-21 DIAGNOSIS — Z7901 Long term (current) use of anticoagulants: Secondary | ICD-10-CM | POA: Diagnosis not present

## 2020-08-21 DIAGNOSIS — I872 Venous insufficiency (chronic) (peripheral): Secondary | ICD-10-CM | POA: Diagnosis not present

## 2020-08-21 DIAGNOSIS — S51011A Laceration without foreign body of right elbow, initial encounter: Secondary | ICD-10-CM | POA: Diagnosis not present

## 2020-08-21 DIAGNOSIS — I480 Paroxysmal atrial fibrillation: Secondary | ICD-10-CM | POA: Diagnosis not present

## 2020-08-21 DIAGNOSIS — I890 Lymphedema, not elsewhere classified: Secondary | ICD-10-CM | POA: Diagnosis not present

## 2020-08-21 DIAGNOSIS — L97812 Non-pressure chronic ulcer of other part of right lower leg with fat layer exposed: Secondary | ICD-10-CM | POA: Diagnosis not present

## 2020-08-21 NOTE — Progress Notes (Signed)
SOLMON, BOHR ([id number]) Visit Report for 08/21/2020 Arrival Information Details Patient Name: [name], [name]. Date of Service: 08/21/2020 8:15 AM Medical Record Number: [id number] Patient Account Number: [id number] Date of Birth/Sex: [birth year] (87 y.o. M) Treating RN: Dolan Amen Primary Care Ilona Colley: Gaynelle Arabian Other Clinician: Referring Vale Peraza: Gaynelle Arabian Treating Chistina Roston/Extender: Skipper Cliche in Treatment: 10 Visit Information History Since Last Visit Pain Present Now: No Patient Arrived: Walker Arrival Time: 08:24 Accompanied By: son Transfer Assistance: None Patient Identification Verified: Yes Secondary Verification Process Completed: Yes Patient Requires Transmission-Based No Precautions: Patient Has Alerts: Yes Patient Alerts: Patient on Blood Thinner NOT DIABETIC ***WARFARIN*** Electronic Signature(s) Signed: 08/21/2020 5:03:01 PM By: Dolan Amen RN Entered By: Dolan Amen on 08/21/2020 08:25:00 [name] ([id number]) -------------------------------------------------------------------------------- Clinic Level of Care Assessment Details Patient Name: [name] Date of Service: 08/21/2020 8:15 AM Medical Record Number: [id number] Patient Account Number: [id number] Date of Birth/Sex: 1930-01-18 (87 y.o. M) Treating RN: Dolan Amen Primary Care Yoshiko Keleher: Gaynelle Arabian Other Clinician: Referring Aayana Reinertsen: Gaynelle Arabian Treating Caz Weaver/Extender: Skipper Cliche in Treatment: 10 Clinic Level of Care Assessment Items TOOL 1 Quantity Score _0  - Use when EandM and Procedure is performed on INITIAL visit 0 ASSESSMENTS - Nursing Assessment / Reassessment _1  - General Physical Exam (combine w/ comprehensive assessment (listed just below) when performed on new 0 pt. evals) _2  - 0 Comprehensive Assessment (HX, ROS, Risk Assessments, Wounds Hx, etc.) ASSESSMENTS - Wound and Skin Assessment / Reassessment _3  - Dermatologic  / Skin Assessment (not related to wound area) 0 ASSESSMENTS - Ostomy and/or Continence Assessment and Care _4  - Incontinence Assessment and Management 0 _5  - 0 Ostomy Care Assessment and Management (repouching, etc.) PROCESS - Coordination of Care _6  - Simple Patient / Family Education for ongoing care 0 _7  - 0 Complex (extensive) Patient / Family Education for ongoing care _8  - 0 Staff obtains Programmer, systems, Records, Test Results / Process Orders _9  - 0 Staff telephones HHA, Nursing Homes / Clarify orders / etc _10  - 0 Routine Transfer to another Facility (non-emergent condition) _11  - 0 Routine Hospital Admission (non-emergent condition) _12  - 0 New Admissions / Biomedical engineer / Ordering NPWT, Apligraf, etc. _13  - 0 Emergency Hospital Admission (emergent condition) PROCESS - Special Needs _14  - Pediatric / Minor Patient Management 0 _15  - 0 Isolation Patient Management _16  - 0 Hearing / Language / Visual special needs _17  - 0 Assessment of Community assistance (transportation, D/C planning, etc.) _18  - 0 Additional assistance / Altered mentation _19  - 0 Support Surface(s) Assessment (bed, cushion, seat, etc.) INTERVENTIONS - Miscellaneous _20  - External ear exam 0 _21  - 0 Patient Transfer (multiple staff / Civil Service fast streamer / Similar devices) _22  - 0 Simple Staple / Suture removal (25 or less) _23  - 0 Complex Staple / Suture removal (26 or more) _24  - 0 Hypo/Hyperglycemic Management (do not check if billed separately) _25  - 0 Ankle / Brachial Index (ABI) - do not check if billed separately Has the patient been seen at the hospital within the last three years: Yes Total Score: 0 Level Of Care: ____ [name] ([id number]) Electronic Signature(s) Signed: 08/21/2020 5:03:01 PM By: Dolan Amen RN Entered By: Dolan Amen on 08/21/2020 08:53:53 [name] ([id number]) -------------------------------------------------------------------------------- Compression Therapy  Details Patient Name: [name] Date of Service: 08/21/2020 8:15 AM Medical Record Number: [id number] Patient Account Number: [id number] Date of Birth/Sex: 02-Jun-1930 (87 y.o. M) Treating RN: Dolan Amen Primary Care Jariel Drost: Marisue Humble,  Herbie [place] Other Clinician: Referring Eissa Buchberger: Gaynelle Arabian Treating Jahmiyah Dullea/Extender: Skipper Cliche in Treatment: 10 Compression Therapy Performed for Wound Assessment: Wound #5 Right,Lateral Lower Leg Performed By: Clinician Dolan Amen, RN Compression Type: Three Layer Pre Treatment ABI: 1.4 Post Procedure Diagnosis Same as Pre-procedure Electronic Signature(s) Signed: 08/21/2020 5:03:01 PM By: Dolan Amen RN Entered By: Dolan Amen on 08/21/2020 08:52:58 [name] ([id number]) -------------------------------------------------------------------------------- Encounter Discharge Information Details Patient Name: [name] Date of Service: 08/21/2020 8:15 AM Medical Record Number: [id number] Patient Account Number: [id number] Date of Birth/Sex: Jun 03, 1930 (87 y.o. M) Treating RN: Dolan Amen Primary Care Yuepheng Schaller: Gaynelle Arabian Other Clinician: Referring Kanijah Groseclose: Gaynelle Arabian Treating Stacee Earp/Extender: Skipper Cliche in Treatment: 10 Encounter Discharge Information Items Discharge Condition: Stable Ambulatory Status: Walker Discharge Destination: Home Transportation: Private Auto Accompanied By: son Schedule Follow-up Appointment: Yes Clinical Summary of Care: Electronic Signature(s) Signed: 08/21/2020 5:03:01 PM By: Dolan Amen RN Entered By: Dolan Amen on 08/21/2020 09:07:14 [name] ([id number]) -------------------------------------------------------------------------------- Lower Extremity Assessment Details Patient Name: [name] Date of Service: 08/21/2020 8:15 AM Medical Record Number: [id number] Patient Account Number: [id number] Date of Birth/Sex: [birth year] (87 y.o.  M) Treating RN: Dolan Amen Primary Care Boyde Grieco: Gaynelle Arabian Other Clinician: Referring Nolan Tuazon: Gaynelle Arabian Treating Lawson Mahone/Extender: Skipper Cliche in Treatment: 10 Edema Assessment Assessed: Shirlyn Goltz: No] Patrice Paradise: Yes] Edema: [Left: Ye] [Right: s] Calf Left: Right: Point of Measurement: 34 cm From Medial Instep 35 cm Ankle Left: Right: Point of Measurement: 10 cm From Medial Instep 22.5 cm Knee To Floor Left: Right: From Medial Instep 42 cm Vascular Assessment Pulses: Dorsalis Pedis Palpable: [Right:Yes] Electronic Signature(s) Signed: 08/21/2020 5:03:01 PM By: Dolan Amen RN Entered By: Dolan Amen on 08/21/2020 08:35:13 [name] ([id number]) -------------------------------------------------------------------------------- Multi Wound Chart Details Patient Name: [name] Date of Service: 08/21/2020 8:15 AM Medical Record Number: [id number] Patient Account Number: [id number] Date of Birth/Sex: October 31, 1930 (87 y.o. M) Treating RN: Dolan Amen Primary Care Timea Breed: Gaynelle Arabian Other Clinician: Referring Tomica Arseneault: Gaynelle Arabian Treating Francile Woolford/Extender: Skipper Cliche in Treatment: 10 Vital Signs Height(in): 71 Pulse(bpm): 102 Weight(lbs): 209 Blood Pressure(mmHg): 158/78 Body Mass Index(BMI): 29 Temperature(F): 97.9 Respiratory Rate(breaths/min): 18 Photos: [N/A:N/A] Wound Location: Right, Lateral Lower Leg N/A N/A Wounding Event: Gradually Appeared N/A N/A Primary Etiology: Venous Leg Ulcer N/A N/A Comorbid History: Cataracts, Arrhythmia, N/A N/A Hypertension Date Acquired: 04/29/2020 N/A N/A Weeks of Treatment: 10 N/A N/A Wound Status: Open N/A N/A Measurements L x W x D (cm) 0.1x0.1x0.1 N/A N/A Area (cm) : 0.008 N/A N/A Volume (cm) : 0.001 N/A N/A % Reduction in Area: 99.90% N/A N/A % Reduction in Volume: 99.90% N/A N/A Classification: Full Thickness Without Exposed N/A N/A Support Structures Exudate  Amount: None Present N/A N/A Granulation Amount: None Present (0%) N/A N/A Necrotic Amount: None Present (0%) N/A N/A Exposed Structures: Fascia: No N/A N/A Fat Layer (Subcutaneous Tissue): No Tendon: No Muscle: No Joint: No Bone: No Epithelialization: Large (67-100%) N/A N/A Treatment Notes Electronic Signature(s) Signed: 08/21/2020 5:03:01 PM By: Dolan Amen RN Entered By: Dolan Amen on 08/21/2020 [place], Kengo J. ([id number]) -------------------------------------------------------------------------------- [place] Details Patient Name: [name] Date of Service: 08/21/2020 8:15 AM Medical Record Number: [id number] Patient Account Number: [id number] Date of Birth/Sex: [birth year] (87 y.o. M) Treating RN: Dolan Amen Primary Care Rossana Molchan: Gaynelle Arabian Other Clinician: Referring Benigna Delisi: Gaynelle Arabian Treating Antonino Nienhuis/Extender: Skipper Cliche in Treatment: 10 Active Inactive Wound/Skin Impairment Nursing  Diagnoses: Knowledge deficit related to ulceration/compromised skin integrity Goals: Patient/caregiver will verbalize understanding of skin care regimen Date Initiated: 06/12/2020 Date Inactivated: 07/17/2020 Target Resolution Date: 07/12/2020 Goal Status: Met Ulcer/skin breakdown will have a volume reduction of 30% by week 4 Date Initiated: 06/12/2020 Date Inactivated: 07/17/2020 Target Resolution Date: 07/12/2020 Goal Status: Met Ulcer/skin breakdown will have a volume reduction of 50% by week 8 Date Initiated: 06/12/2020 Date Inactivated: 08/21/2020 Target Resolution Date: 08/12/2020 Goal Status: Met Ulcer/skin breakdown will have a volume reduction of 80% by week 12 Date Initiated: 06/12/2020 Date Inactivated: 08/21/2020 Target Resolution Date: 09/12/2020 Goal Status: Met Ulcer/skin breakdown will heal within 14 weeks Date Initiated: 06/12/2020 Target Resolution Date: 10/12/2020 Goal Status: Active Interventions: Assess  patient/caregiver ability to obtain necessary supplies Assess patient/caregiver ability to perform ulcer/skin care regimen upon admission and as needed Assess ulceration(s) every visit Notes: Electronic Signature(s) Signed: 08/21/2020 5:03:01 PM By: Dolan Amen RN Entered By: Dolan Amen on 08/21/2020 08:49:43 [name] ([id number]) -------------------------------------------------------------------------------- Pain Assessment Details Patient Name: [name] Date of Service: 08/21/2020 8:15 AM Medical Record Number: [id number] Patient Account Number: [id number] Date of Birth/Sex: 1930/08/30 (87 y.o. M) Treating RN: Dolan Amen Primary Care Verdon Ferrante: Gaynelle Arabian Other Clinician: Referring Alphonse Asbridge: Gaynelle Arabian Treating Meilah Delrosario/Extender: Skipper Cliche in Treatment: 10 Active Problems Location of Pain Severity and Description of Pain Patient Has Paino No Site Locations Rate the pain. Current Pain Level: 0 Pain Management and Medication Current Pain Management: Electronic Signature(s) Signed: 08/21/2020 5:03:01 PM By: Dolan Amen RN Entered By: Dolan Amen on 08/21/2020 08:26:08 [name] ([id number]) -------------------------------------------------------------------------------- Patient/Caregiver Education Details Patient Name: [name] Date of Service: 08/21/2020 8:15 AM Medical Record Number: [id number] Patient Account Number: [id number] Date of Birth/Gender: [birth year] (87 y.o. M) Treating RN: Dolan Amen Primary Care Physician: Gaynelle Arabian Other Clinician: Referring Physician: Gaynelle Arabian Treating Physician/Extender: Skipper Cliche in Treatment: 10 Education Assessment Education Provided To: Patient Education Topics Provided Wound/Skin Impairment: Methods: Explain/Verbal Responses: State content correctly Electronic Signature(s) Signed: 08/21/2020 5:03:01 PM By: Dolan Amen RN Entered By: Dolan Amen on 08/21/2020 09:06:44 [name] ([id number]) -------------------------------------------------------------------------------- Wound Assessment Details Patient Name: [name] Date of Service: 08/21/2020 8:15 AM Medical Record Number: [id number] Patient Account Number: [id number] Date of Birth/Sex: 08-Nov-1930 (87 y.o. M) Treating RN: Dolan Amen Primary Care Xiao Graul: Gaynelle Arabian Other Clinician: Referring Sharlene Mccluskey: Gaynelle Arabian Treating Manette Doto/Extender: Skipper Cliche in Treatment: 10 Wound Status Wound Number: 5 Primary Etiology: Venous Leg Ulcer Wound Location: Right, Lateral Lower Leg Wound Status: Open Wounding Event: Gradually Appeared Comorbid History: Cataracts, Arrhythmia, Hypertension Date Acquired: 04/29/2020 Weeks Of Treatment: 10 Clustered Wound: No Photos Wound Measurements Length: (cm) 0.1 Width: (cm) 0.1 Depth: (cm) 0.1 Area: (cm) 0.008 Volume: (cm) 0.001 % Reduction in Area: 99.9% % Reduction in Volume: 99.9% Epithelialization: Large (67-100%) Tunneling: No Undermining: No Wound Description Classification: Full Thickness Without Exposed Support Structures Exudate Amount: None Present Foul Odor After Cleansing: No Slough/Fibrino No Wound Bed Granulation Amount: None Present (0%) Exposed Structure Necrotic Amount: None Present (0%) Fascia Exposed: No Fat Layer (Subcutaneous Tissue) Exposed: No Tendon Exposed: No Muscle Exposed: No Joint Exposed: No Bone Exposed: No Treatment Notes Wound #5 (Lower Leg) Wound Laterality: Right, Lateral Cleanser Soap and Water Discharge Instruction: Gently cleanse wound with antibacterial soap, rinse and pat dry prior to dressing wounds Peri-Wound Care Topical SHANE, BADEAUX ([id number]) Primary Dressing Xeroform-HBD 2x2 (in/in) Discharge Instruction: Apply Xeroform double  layer to wound bed Secondary Dressing ABD Pad 5x9 (in/in) Discharge Instruction: Cover with ABD pad Secured  With Compression Wrap Profore Lite LF 3 Multilayer Compression Bandaging System Discharge Instruction: Apply 3 multi-layer wrap as prescribed. Compression Stockings Add-Ons Electronic Signature(s) Signed: 08/21/2020 5:03:01 PM By: Dolan Amen RN Entered By: Dolan Amen on 08/21/2020 08:34:03 [name] ([id number]) -------------------------------------------------------------------------------- Vitals Details Patient Name: [name] Date of Service: 08/21/2020 8:15 AM Medical Record Number: [id number] Patient Account Number: [id number] Date of Birth/Sex: 03-11-1930 (87 y.o. M) Treating RN: Dolan Amen Primary Care Gerard Cantara: Gaynelle Arabian Other Clinician: Referring Zaydenn Balaguer: Gaynelle Arabian Treating Ashon Rosenberg/Extender: Skipper Cliche in Treatment: 10 Vital Signs Time Taken: 08:25 Temperature (F): 97.9 Height (in): 71 Pulse (bpm): 102 Weight (lbs): 209 Respiratory Rate (breaths/min): 18 Body Mass Index (BMI): 29.1 Blood Pressure (mmHg): 158/78 Reference Range: 80 - 120 mg / dl Electronic Signature(s) Signed: 08/21/2020 5:03:01 PM By: Dolan Amen RN Entered By: Dolan Amen on 08/21/2020 08:25:50

## 2020-08-21 NOTE — Progress Notes (Signed)
[name], [name] ([id number]) Visit Report for 08/21/2020 Chief Complaint Document Details Patient Name: [name], [name]. Date of Service: 08/21/2020 8:15 AM Medical Record Number: [id number] Patient Account Number: [id number] Date of Birth/Sex: 03/07/1930 (87 y.o. M) Treating RN: Primary Care Provider: Gaynelle Arabian Other Clinician: Referring Provider: Gaynelle Arabian Treating Provider/Extender: Skipper Cliche in Treatment: 10 Information Obtained from: Patient Chief Complaint Right LE Ulcers Electronic Signature(s) Signed: 08/21/2020 8:59:06 AM By: Worthy Keeler PA-C Entered By: Worthy Keeler on 08/21/2020 08:59:06 [name], [name] ([id number]) -------------------------------------------------------------------------------- HPI Details Patient Name: [name] Date of Service: 08/21/2020 8:15 AM Medical Record Number: [id number] Patient Account Number: [id number] Date of Birth/Sex: [birth year] (87 y.o. M) Treating RN: Primary Care Provider: Gaynelle Arabian Other Clinician: Referring Provider: Gaynelle Arabian Treating Provider/Extender: Skipper Cliche in Treatment: 10 History of Present Illness HPI Description: 87 year old gentleman has been referred to [place] by his PCP Dr. Gaynelle Arabian, for a ulcer on the left posterior lower leg which has been there for about 6 weeks. The patient was thought to have an infection and was put on doxycycline for 14 days and referred to the wound center. He has a past medical history of paroxysmal atrial fibrillation, hypertension, diverticulosis, status post right hip replacement, appendectomy. He has not been a smoker since 11. the anterior shin wound has been more recent about 2 weeks and this was caused probably due to a blunt abrasion. 03/06/17 on evaluation today patient's ulcer appears to show signs of excellent improvement. He is definitely showing additional evidence of granulation which is great news. Overall I'm extremely pleased with  the progress that he has been making from the beginning to where we are right now. With that being said I would like to potentially try different dressing to see if we could stimulate this to heal even faster. 03/13/17 on evaluation today patient appears to be doing little better in regard to his leg ulcer at this time. He has been tolerating the dressing changes without complication. There does not appear to be any evidence of infection which is good news. He does have some additional granulation observed at this point and I do believe that the silver collagen dressing has been of benefit in this regard. Overall I'm very pleased with the progress she's made since last visit the only downside is that he does have additional maceration due to drainage compared to previous we may need to do something to address this. 03/27/17 on evaluation today patient appears to be doing well in regard to his left posterior lower surety ulcer. He has a significant area of epithelial growth which seems to be doing excellent at this point in time. He also seems to be well controlled as far as slough is concerned and drainage there is no evidence of maceration and there really was no slough that could not be cleaned off with saline and gauze. Overall patient seems to be making excellent progress. 04/10/17 on evaluation today patient appears to be doing better in regard to the original ulcer for which we have been treating him. Unfortunately he has two areas where he sustained injury to his posterior leg more proximal to where the current ulceration is. This was due to it appears to be tape and adhesive at this point. When he was using the Allevyn dressing there was no problems however they ran out of these and since that point there seems to have been more irritation around the wound from the Band-Aids that his wife  has been using in their place. Other than this which appear to be very superficial new ulcers everything  appears to be progressing nicely. 04/24/17 on evaluation today patient appears to be doing excellent in regard to his left posterior lower extremity ulcer. He is been tolerating the dressing changes without complication fortunately there does not appear to be any evidence of infection at this point in time. Overall he has made significant improvements since I last saw him. 05/08/17 on evaluation today patient's wound appears to be completely healed at this point. Fortunately this is great news and though it is closed over with epithelium there still some healing to be done underneath for this completely toughen up this was explained to the patient and his wife today as well. Readmission: 06/12/2020 this is a gentleman whom I have previously seen here in the clinic before in regards to similar issues with wounds on his lower extremities. 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 He is also very swollen the skin is tense and even shiny due to the fluid buildup. Obviously I think this is definitely something that we need to address sooner rather than later as far as that is concerned. Fortunately there does not appear to be any signs of active infection he is on a blood thinner, Coumadin, this is due to chronic atrial fibrillation. 06/19/2020 upon evaluation today patient's wound is actually showing signs of significant improvement. I am actually extremely pleased with where things stand he is going require little bit of debridement to clear away some of the necrotic debris on the surface of the wound he actually is okay with that he is on Coumadin selective do this carefully but nonetheless I think it is definitely achievable.. 06/26/2020 upon evaluation today patient appears to be doing well in regard to his leg ulcer. I feel like he is showing signs of improvement  and overall very pleased in this regard. Unfortunately he did have a fall where he injured his right elbow this is new and subsequently does need to be addressed as well with dressings today. Fortunately it does not appear to be too bad. 07/03/2020 upon evaluation today patient appears to be doing poorly upon inspection today. In fact we did not even get a look at his wounds because he was not acting himself at all. He was very lethargic leading to the left when he came in in his wheelchair. He did not seem to be extremely responsive which also have me very concerned. His family member who is with him states this has been going on for the last day. With that being said he is not exactly sure what is going on either. Overall the patient does not appear to be in very good shape compared to what I have previously noted. I am not exactly sure what the issue may be but I think he probably needs to be evaluated at the ER to be honest. 07/17/2020 upon evaluation today patient's wound is actually showing signs of good improvement. Is a little bit more swollen than it was previous but again I think this is secondary to the fact to be honest that he has not had the wrap on for the past week but nonetheless the wound seems to be doing great even so. He is able to get around and walk a little bit better today which  is great news as well. I am very pleased in that regard. Nonetheless I do believe that the patient is going to have to go back into the compression wrap I think he was doing a lot better as far as the healing of the wound when he was wrapped. His family members in agreement with that plan today as well. 07/24/2020 upon evaluation today patient actually appears to be doing quite well in regard to his wounds. He has been tolerating the dressing changes without complication and overall I am quite pleased actually with where things stand today. There does not appear to be any evidence of [name], [name].  ([id number]) infection which is great news and overall the patient is doing extremely well. 07/31/2020 upon evaluation today patient appears to be doing well with regard to his wound. He has been tolerating the dressing changes without complication and overall I am actually extremely pleased with where things stand today. I do believe that the patient is making excellent progress all things considered here. And his measurements today reflect that. 08/07/2020 upon evaluation today patient appears to be doing well with regard to his leg ulcer. Has been tolerating the dressing changes without complication. Fortunately there is no signs of active infection at this time which is great news. No fevers, chills, nausea, vomiting, or diarrhea. 08/21/2020 upon evaluation today patient appears to be doing well with regard to his wounds. In fact the 2 small areas that were open 1 of those is healed the other is not healed but appears to be doing quite well. There does not appear to be any signs of active infection at this time which is great news. No fevers, chills, nausea, vomiting, or diarrhea. Overall I feel like that the patient is making good progress here and in general I think that we are definitely headed in the right direction as far as trying to get the wound completely closed. Electronic Signature(s) Signed: 08/21/2020 9:05:57 AM By: Worthy Keeler PA-C Entered By: Worthy Keeler on 08/21/2020 09:05:57 [name] ([id number]) -------------------------------------------------------------------------------- Physical Exam Details Patient Name: [name], [name] Date of Service: 08/21/2020 8:15 AM Medical Record Number: [id number] Patient Account Number: [id number] Date of Birth/Sex: [birth year] (87 y.o. M) Treating RN: Primary Care Provider: Gaynelle Arabian Other Clinician: Referring Provider: Gaynelle Arabian Treating Provider/Extender: Skipper Cliche in Treatment:  44 Constitutional Well-nourished and well-hydrated in no acute distress. Respiratory normal breathing without difficulty. Psychiatric this patient is able to make decisions and demonstrates good insight into disease process. Alert and Oriented x 3. pleasant and cooperative. Notes Patient had just a very small 2 mm opening circular in the side of his leg laterally the other spot is completely healed. This is very close to complete resolution though hopefully we will be able to get this done fairly rapidly. Nonetheless this is definitely still open and I think we still need to perform compression wrapping for the time being. Electronic Signature(s) Signed: 08/21/2020 9:06:24 AM By: Worthy Keeler PA-C Entered By: Worthy Keeler on 08/21/2020 09:06:24 [name] ([id number]) -------------------------------------------------------------------------------- Physician Orders Details Patient Name: [name] Date of Service: 08/21/2020 8:15 AM Medical Record Number: [id number] Patient Account Number: [id number] Date of Birth/Sex: Sep 07, 1930 (87 y.o. M) Treating RN: Dolan Amen Primary Care Provider: Gaynelle Arabian Other Clinician: Referring Provider: Gaynelle Arabian Treating Provider/Extender: Skipper Cliche in Treatment: 10 Verbal / Phone Orders: No Diagnosis Coding Follow-up Appointments o Return Appointment in 1 week. Bathing/ Shower/ Hygiene o May  shower with wound dressing protected with water repellent cover or cast protector. Edema Control - Lymphedema / Segmental Compressive Device / Other o Optional: One layer of unna paste to top of compression wrap (to act as an anchor). o Elevate, Exercise Daily and Avoid Standing for Long Periods of Time. o Elevate legs to the level of the heart and pump ankles as often as possible o Elevate leg(s) parallel to the floor when sitting. Wound Treatment Wound #5 - Lower Leg Wound Laterality: Right, Lateral Cleanser:  Soap and Water 1 x Per Week/15 Days Discharge Instructions: Gently cleanse wound with antibacterial soap, rinse and pat dry prior to dressing wounds Primary Dressing: Xeroform-HBD 2x2 (in/in) 1 x Per Week/15 Days Discharge Instructions: Apply Xeroform double layer to wound bed Secondary Dressing: ABD Pad 5x9 (in/in) 1 x Per Week/15 Days Discharge Instructions: Cover with ABD pad Compression Wrap: Profore Lite LF 3 Multilayer Compression Bandaging System 1 x Per Week/15 Days Discharge Instructions: Apply 3 multi-layer wrap as prescribed. Electronic Signature(s) Signed: 08/21/2020 5:03:01 PM By: Dolan Amen RN Signed: 08/21/2020 5:54:54 PM By: Worthy Keeler PA-C Entered By: Dolan Amen on 08/21/2020 08:53:48 [name] ([id number]) -------------------------------------------------------------------------------- Problem List Details Patient Name: [name], [name]. Date of Service: 08/21/2020 8:15 AM Medical Record Number: [id number] Patient Account Number: [id number] Date of Birth/Sex: 09-09-1930 (87 y.o. M) Treating RN: Primary Care Provider: Gaynelle Arabian Other Clinician: Referring Provider: Gaynelle Arabian Treating Provider/Extender: Skipper Cliche in Treatment: 10 Active Problems ICD-10 Encounter Code Description Active Date MDM Diagnosis I87.2 Venous insufficiency (chronic) (peripheral) 06/12/2020 No Yes I89.0 Lymphedema, not elsewhere classified 06/12/2020 No Yes L97.812 Non-pressure chronic ulcer of other part of right lower leg with fat layer 06/12/2020 No Yes exposed S51.011A Laceration without foreign body of right elbow, initial encounter 06/26/2020 No Yes I48.0 Paroxysmal atrial fibrillation 06/12/2020 No Yes Inactive Problems Resolved Problems Electronic Signature(s) Signed: 08/21/2020 8:58:54 AM By: Worthy Keeler PA-C Entered By: Worthy Keeler on 08/21/2020 08:58:54 [name]  ([id number]) -------------------------------------------------------------------------------- Progress Note Details Patient Name: [name] Date of Service: 08/21/2020 8:15 AM Medical Record Number: [id number] Patient Account Number: [id number] Date of Birth/Sex: 12/24/1930 (87 y.o. M) Treating RN: Primary Care Provider: Gaynelle Arabian Other Clinician: Referring Provider: Gaynelle Arabian Treating Provider/Extender: Skipper Cliche in Treatment: 10 Subjective Chief Complaint Information obtained from Patient Right LE Ulcers History of Present Illness (HPI) 87 year old gentleman has been referred to [place] by his PCP Dr. Gaynelle Arabian, for a ulcer on the left posterior lower leg which has been there for about 6 weeks. The patient was thought to have an infection and was put on doxycycline for 14 days and referred to the wound center. He has a past medical history of paroxysmal atrial fibrillation, hypertension, diverticulosis, status post right hip replacement, appendectomy. He has not been a smoker since 15. the anterior shin wound has been more recent about 2 weeks and this was caused probably due to a blunt abrasion. 03/06/17 on evaluation today patient's ulcer appears to show signs of excellent improvement. He is definitely showing additional evidence of granulation which is great news. Overall I'm extremely pleased with the progress that he has been making from the beginning to where we are right now. With that being said I would like to potentially try different dressing to see if we could stimulate this to heal even faster. 03/13/17 on evaluation today patient appears to be doing little better in regard to his leg ulcer at this  time. He has been tolerating the dressing changes without complication. There does not appear to be any evidence of infection which is good news. He does have some additional granulation observed at this point and I do believe that the silver collagen dressing  has been of benefit in this regard. Overall I'm very pleased with the progress she's made since last visit the only downside is that he does have additional maceration due to drainage compared to previous we may need to do something to address this. 03/27/17 on evaluation today patient appears to be doing well in regard to his left posterior lower surety ulcer. He has a significant area of epithelial growth which seems to be doing excellent at this point in time. He also seems to be well controlled as far as slough is concerned and drainage there is no evidence of maceration and there really was no slough that could not be cleaned off with saline and gauze. Overall patient seems to be making excellent progress. 04/10/17 on evaluation today patient appears to be doing better in regard to the original ulcer for which we have been treating him. Unfortunately he has two areas where he sustained injury to his posterior leg more proximal to where the current ulceration is. This was due to it appears to be tape and adhesive at this point. When he was using the Allevyn dressing there was no problems however they ran out of these and since that point there seems to have been more irritation around the wound from the Band-Aids that his wife has been using in their place. Other than this which appear to be very superficial new ulcers everything appears to be progressing nicely. 04/24/17 on evaluation today patient appears to be doing excellent in regard to his left posterior lower extremity ulcer. He is been tolerating the dressing changes without complication fortunately there does not appear to be any evidence of infection at this point in time. Overall he has made significant improvements since I last saw him. 05/08/17 on evaluation today patient's wound appears to be completely healed at this point. Fortunately this is great news and though it is closed over with epithelium there still some healing to be done  underneath for this completely toughen up this was explained to the patient and his wife today as well. Readmission: 06/12/2020 this is a gentleman whom I have previously seen here in the clinic before in regards to similar issues with wounds on his lower extremities. 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 He is also very swollen the skin is tense and even shiny due to the fluid buildup. Obviously I think this is definitely something that we need to address sooner rather than later as far as that is concerned. Fortunately there does not appear to be any signs of active infection he is on a blood thinner, Coumadin, this is due to chronic atrial fibrillation. 06/19/2020 upon evaluation today patient's wound is actually showing signs of significant improvement. I am actually extremely pleased with where things stand he is going require little bit of debridement to clear away some of the necrotic debris on the surface of the wound he actually is okay with that he is on Coumadin selective do this carefully but nonetheless I think it is definitely achievable.. 06/26/2020 upon evaluation today patient appears to be doing well in regard to  his leg ulcer. I feel like he is showing signs of improvement and overall very pleased in this regard. Unfortunately he did have a fall where he injured his right elbow this is new and subsequently does need to be addressed as well with dressings today. Fortunately it does not appear to be too bad. 07/03/2020 upon evaluation today patient appears to be doing poorly upon inspection today. In fact we did not even get a look at his wounds because he was not acting himself at all. He was very lethargic leading to the left when he came in in his wheelchair. He did not seem to be extremely responsive which also have me very concerned. His  family member who is with him states this has been going on for the last day. With that being said he is not exactly sure what is going on either. Overall the patient does not appear to be in very good shape compared to what I have previously noted. I am not exactly sure what the issue may be but I think he probably needs to be evaluated at the ER to be honest. 07/17/2020 upon evaluation today patient's wound is actually showing signs of good improvement. Is a little bit more swollen than it was previous but again I think this is secondary to the fact to be honest that he has not had the wrap on for the past week but nonetheless the wound seems to be doing great even so. He is able to get around and walk a little bit better today which is great news as well. I am very pleased in that regard. [name], [name] ([id number]) Nonetheless I do believe that the patient is going to have to go back into the compression wrap I think he was doing a lot better as far as the healing of the wound when he was wrapped. His family members in agreement with that plan today as well. 07/24/2020 upon evaluation today patient actually appears to be doing quite well in regard to his wounds. He has been tolerating the dressing changes without complication and overall I am quite pleased actually with where things stand today. There does not appear to be any evidence of infection which is great news and overall the patient is doing extremely well. 07/31/2020 upon evaluation today patient appears to be doing well with regard to his wound. He has been tolerating the dressing changes without complication and overall I am actually extremely pleased with where things stand today. I do believe that the patient is making excellent progress all things considered here. And his measurements today reflect that. 08/07/2020 upon evaluation today patient appears to be doing well with regard to his leg ulcer. Has been tolerating the dressing  changes without complication. Fortunately there is no signs of active infection at this time which is great news. No fevers, chills, nausea, vomiting, or diarrhea. 08/21/2020 upon evaluation today patient appears to be doing well with regard to his wounds. In fact the 2 small areas that were open 1 of those is healed the other is not healed but appears to be doing quite well. There does not appear to be any signs of active infection at this time which is great news. No fevers, chills, nausea, vomiting, or diarrhea. Overall I feel like that the patient is making good progress here and in general I think that we are definitely headed in the right direction as far as trying to get the wound completely closed. Objective Constitutional Well-nourished and  well-hydrated in no acute distress. Vitals Time Taken: 8:25 AM, Height: 71 in, Weight: 209 lbs, BMI: 29.1, Temperature: 97.9 F, Pulse: 102 bpm, Respiratory Rate: 18 breaths/min, Blood Pressure: 158/78 mmHg. Respiratory normal breathing without difficulty. Psychiatric this patient is able to make decisions and demonstrates good insight into disease process. Alert and Oriented x 3. pleasant and cooperative. General Notes: Patient had just a very small 2 mm opening circular in the side of his leg laterally the other spot is completely healed. This is very close to complete resolution though hopefully we will be able to get this done fairly rapidly. Nonetheless this is definitely still open and I think we still need to perform compression wrapping for the time being. Integumentary (Hair, Skin) Wound #5 status is Open. Original cause of wound was Gradually Appeared. The date acquired was: 04/29/2020. The wound has been in treatment 10 weeks. The wound is located on the Right,Lateral Lower Leg. The wound measures 0.1cm length x 0.1cm width x 0.1cm depth; 0.008cm^2 area and 0.001cm^3 volume. There is no tunneling or undermining noted. There is a none  present amount of drainage noted. There is no granulation within the wound bed. There is no necrotic tissue within the wound bed. Assessment Active Problems ICD-10 Venous insufficiency (chronic) (peripheral) Lymphedema, not elsewhere classified Non-pressure chronic ulcer of other part of right lower leg with fat layer exposed Laceration without foreign body of right elbow, initial encounter Paroxysmal atrial fibrillation Procedures Wound #5 [name], [name] ([id number]) Pre-procedure diagnosis of Wound #5 is a Venous Leg Ulcer located on the Right,Lateral Lower Leg . There was a Three Layer Compression Therapy Procedure with a pre-treatment ABI of 1.4 by Dolan Amen, RN. Post procedure Diagnosis Wound #5: Same as Pre-Procedure Plan Follow-up Appointments: Return Appointment in 1 week. Bathing/ Shower/ Hygiene: May shower with wound dressing protected with water repellent cover or cast protector. Edema Control - Lymphedema / Segmental Compressive Device / Other: Optional: One layer of unna paste to top of compression wrap (to act as an anchor). Elevate, Exercise Daily and Avoid Standing for Long Periods of Time. Elevate legs to the level of the heart and pump ankles as often as possible Elevate leg(s) parallel to the floor when sitting. WOUND #5: - Lower Leg Wound Laterality: Right, Lateral Cleanser: Soap and Water 1 x Per Week/15 Days Discharge Instructions: Gently cleanse wound with antibacterial soap, rinse and pat dry prior to dressing wounds Primary Dressing: Xeroform-HBD 2x2 (in/in) 1 x Per Week/15 Days Discharge Instructions: Apply Xeroform double layer to wound bed Secondary Dressing: ABD Pad 5x9 (in/in) 1 x Per Week/15 Days Discharge Instructions: Cover with ABD pad Compression Wrap: Profore Lite LF 3 Multilayer Compression Bandaging System 1 x Per Week/15 Days Discharge Instructions: Apply 3 multi-layer wrap as prescribed. 1. Would recommend currently that we going  continue with the Xeroform gauze dressing which I think is doing a good job. 2. I am also can recommend that we have the patient continue with ABD pads to cover. 3. Were also can continue with 3 layer compression wrap which is actually doing an awesome job for him from the standpoint of compression. We will see patient back for reevaluation in 1 week here in the clinic. If anything worsens or changes patient will contact our office for additional recommendations. Electronic Signature(s) Signed: 08/21/2020 9:06:48 AM By: Worthy Keeler PA-C Entered By: Worthy Keeler on 08/21/2020 09:06:48 [name] ([id number]) -------------------------------------------------------------------------------- SuperBill Details Patient Name: [name] Date of Service:  08/21/2020 Medical Record Number: [id number] Patient Account Number: [id number] Date of Birth/Sex: 20-May-1930 (87 y.o. M) Treating RN: Dolan Amen Primary Care Provider: Gaynelle Arabian Other Clinician: Referring Provider: Gaynelle Arabian Treating Provider/Extender: Skipper Cliche in Treatment: 10 Diagnosis Coding ICD-10 Codes Code Description I87.2 Venous insufficiency (chronic) (peripheral) I89.0 Lymphedema, not elsewhere classified L97.812 Non-pressure chronic ulcer of other part of right lower leg with fat layer exposed S51.011A Laceration without foreign body of right elbow, initial encounter I48.0 Paroxysmal atrial fibrillation Facility Procedures CPT4 Code: [id number] Description: (Facility Use Only) [phone] - [facility] RT LEG Modifier: Quantity: 1 Physician Procedures CPT4 [id number] Description: [id number] - WC PHYS LEVEL 4 - EST PT Modifier: Quantity: 1 CPT4 Code: Description: ICD-10 Diagnosis Description I87.2 Venous insufficiency (chronic) (peripheral) I89.0 Lymphedema, not elsewhere classified [id number] Non-pressure chronic ulcer of other part of right lower leg with fat la S51.011A Laceration  without foreign  body of right elbow, initial encounter Modifier: yer exposed Quantity: Electronic Signature(s) Signed: 08/21/2020 9:07:00 AM By: Worthy Keeler PA-C Entered By: Worthy Keeler on 08/21/2020 09:07:00

## 2020-08-28 ENCOUNTER — Other Ambulatory Visit: Payer: Self-pay

## 2020-08-28 ENCOUNTER — Encounter: Payer: Medicare Other | Admitting: Physician Assistant

## 2020-08-28 DIAGNOSIS — I872 Venous insufficiency (chronic) (peripheral): Secondary | ICD-10-CM | POA: Diagnosis not present

## 2020-08-28 DIAGNOSIS — I890 Lymphedema, not elsewhere classified: Secondary | ICD-10-CM | POA: Diagnosis not present

## 2020-08-28 DIAGNOSIS — Z7901 Long term (current) use of anticoagulants: Secondary | ICD-10-CM | POA: Diagnosis not present

## 2020-08-28 DIAGNOSIS — S51011A Laceration without foreign body of right elbow, initial encounter: Secondary | ICD-10-CM | POA: Diagnosis not present

## 2020-08-28 DIAGNOSIS — I480 Paroxysmal atrial fibrillation: Secondary | ICD-10-CM | POA: Diagnosis not present

## 2020-08-28 DIAGNOSIS — L97812 Non-pressure chronic ulcer of other part of right lower leg with fat layer exposed: Secondary | ICD-10-CM | POA: Diagnosis not present

## 2020-08-28 NOTE — Progress Notes (Signed)
HAYES, STACHOWICZ ([id number]) Visit Report for 08/28/2020 Arrival Information Details Patient Name: [name], [name]. Date of Service: 08/28/2020 8:15 AM Medical Record Number: [id number] Patient Account Number: [id number] Date of Birth/Sex: [birth year] (87 y.o. M) Treating RN: Dolan Amen Primary Care Antoinett Dorman: Gaynelle Arabian Other Clinician: Referring Ronnica Dreese: Gaynelle Arabian Treating Cordie Buening/Extender: Skipper Cliche in Treatment: 11 Visit Information History Since Last Visit Pain Present Now: No Patient Arrived: Walker Arrival Time: 08:22 Accompanied By: son Transfer Assistance: None Patient Identification Verified: Yes Secondary Verification Process Completed: Yes Patient Requires Transmission-Based No Precautions: Patient Has Alerts: Yes Patient Alerts: Patient on Blood Thinner NOT DIABETIC ***WARFARIN*** Electronic Signature(s) Signed: 08/28/2020 11:41:50 AM By: Dolan Amen RN Entered By: Dolan Amen on 08/28/2020 08:24:08 [name] ([id number]) -------------------------------------------------------------------------------- Clinic Level of Care Assessment Details Patient Name: [name] Date of Service: 08/28/2020 8:15 AM Medical Record Number: [id number] Patient Account Number: [id number] Date of Birth/Sex: 07/06/1930 (87 y.o. M) Treating RN: Dolan Amen Primary Care Zeyad Delaguila: Gaynelle Arabian Other Clinician: Referring Jhoan Schmieder: Gaynelle Arabian Treating Beuford Garcilazo/Extender: Skipper Cliche in Treatment: 11 Clinic Level of Care Assessment Items TOOL 1 Quantity Score '[]'$  - Use when EandM and Procedure is performed on INITIAL visit 0 ASSESSMENTS - Nursing Assessment / Reassessment '[]'$  - General Physical Exam (combine w/ comprehensive assessment (listed just below) when performed on new 0 pt. evals) '[]'$  - 0 Comprehensive Assessment (HX, ROS, Risk Assessments, Wounds Hx, etc.) ASSESSMENTS - Wound and Skin Assessment / Reassessment '[]'$  -  Dermatologic / Skin Assessment (not related to wound area) 0 ASSESSMENTS - Ostomy and/or Continence Assessment and Care '[]'$  - Incontinence Assessment and Management 0 '[]'$  - 0 Ostomy Care Assessment and Management (repouching, etc.) PROCESS - Coordination of Care '[]'$  - Simple Patient / Family Education for ongoing care 0 '[]'$  - 0 Complex (extensive) Patient / Family Education for ongoing care '[]'$  - 0 Staff obtains Consents, Records, Test Results / Process Orders '[]'$  - 0 Staff telephones HHA, Nursing Homes / Clarify orders / etc '[]'$  - 0 Routine Transfer to another Facility (non-emergent condition) '[]'$  - 0 Routine Hospital Admission (non-emergent condition) '[]'$  - 0 New Admissions / Biomedical engineer / Ordering NPWT, Apligraf, etc. '[]'$  - 0 Emergency Hospital Admission (emergent condition) PROCESS - Special Needs '[]'$  - Pediatric / Minor Patient Management 0 '[]'$  - 0 Isolation Patient Management '[]'$  - 0 Hearing / Language / Visual special needs '[]'$  - 0 Assessment of Community assistance (transportation, D/C planning, etc.) '[]'$  - 0 Additional assistance / Altered mentation '[]'$  - 0 Support Surface(s) Assessment (bed, cushion, seat, etc.) INTERVENTIONS - Miscellaneous '[]'$  - External ear exam 0 '[]'$  - 0 Patient Transfer (multiple staff / Civil Service fast streamer / Similar devices) '[]'$  - 0 Simple Staple / Suture removal (25 or less) '[]'$  - 0 Complex Staple / Suture removal (26 or more) '[]'$  - 0 Hypo/Hyperglycemic Management (do not check if billed separately) '[]'$  - 0 Ankle / Brachial Index (ABI) - do not check if billed separately Has the patient been seen at the hospital within the last three years: Yes Total Score: 0 Level Of Care: ____ [name] ([id number]) Electronic Signature(s) Signed: 08/28/2020 11:41:50 AM By: Dolan Amen RN Entered By: Dolan Amen on 08/28/2020 08:48:52 [name]  ([id number]) -------------------------------------------------------------------------------- Compression Therapy Details Patient Name: [name] Date of Service: 08/28/2020 8:15 AM Medical Record Number: [id number] Patient Account Number: [id number] Date of Birth/Sex: 01-05-1931 (87 y.o. M) Treating RN: Dolan Amen Primary Care Emalina Dubreuil: Marisue Humble,  Herbie [place] Other Clinician: Referring Braylyn Kalter: Gaynelle Arabian Treating Elroy Schembri/Extender: Skipper Cliche in Treatment: 11 Compression Therapy Performed for Wound Assessment: Non-Wound Location Performed By: Cora Daniels, RN Compression Type: Three Layer Pre Treatment ABI: 1.4 Location: Lower Extremity, Right Post Procedure Diagnosis Same as Pre-procedure Electronic Signature(s) Signed: 08/28/2020 11:41:50 AM By: Dolan Amen RN Entered By: Dolan Amen on 08/28/2020 08:48:04 [name] ([id number]) -------------------------------------------------------------------------------- Encounter Discharge Information Details Patient Name: [name] Date of Service: 08/28/2020 8:15 AM Medical Record Number: [id number] Patient Account Number: [id number] Date of Birth/Sex: [birth year] (87 y.o. M) Treating RN: Dolan Amen Primary Care Namiko Pritts: Gaynelle Arabian Other Clinician: Referring Frimet Durfee: Gaynelle Arabian Treating Jenina Moening/Extender: Skipper Cliche in Treatment: 11 Encounter Discharge Information Items Discharge Condition: Stable Ambulatory Status: Walker Discharge Destination: Home Transportation: Private Auto Accompanied By: son Schedule Follow-up Appointment: Yes Clinical Summary of Care: Electronic Signature(s) Signed: 08/28/2020 11:41:50 AM By: Dolan Amen RN Entered By: Dolan Amen on 08/28/2020 08:49:50 [name] ([id number]) -------------------------------------------------------------------------------- Lower Extremity Assessment Details Patient Name: [name] Date of  Service: 08/28/2020 8:15 AM Medical Record Number: [id number] Patient Account Number: [id number] Date of Birth/Sex: 07/12/1930 (87 y.o. M) Treating RN: Dolan Amen Primary Care Sayan Aldava: Gaynelle Arabian Other Clinician: Referring Trinia Georgi: Gaynelle Arabian Treating Quaron Delacruz/Extender: Skipper Cliche in Treatment: 11 Edema Assessment Assessed: [Left: No] Patrice Paradise: Yes] Edema: [Left: Ye] [Right: s] Calf Left: Right: Point of Measurement: 34 cm From Medial Instep 34 cm Ankle Left: Right: Point of Measurement: 10 cm From Medial Instep 23 cm Vascular Assessment Pulses: Dorsalis Pedis Palpable: [Right:Yes] Electronic Signature(s) Signed: 08/28/2020 11:41:50 AM By: Dolan Amen RN Entered By: Dolan Amen on 08/28/2020 08:34:34 [name] ([id number]) -------------------------------------------------------------------------------- Multi Wound Chart Details Patient Name: [name] Date of Service: 08/28/2020 8:15 AM Medical Record Number: [id number] Patient Account Number: [id number] Date of Birth/Sex: 1930/04/05 (87 y.o. M) Treating RN: Dolan Amen Primary Care Iseah Plouff: Gaynelle Arabian Other Clinician: Referring Ubaldo Daywalt: Gaynelle Arabian Treating Markes Shatswell/Extender: Skipper Cliche in Treatment: 11 Vital Signs Height(in): 71 Pulse(bpm): 93 Weight(lbs): 209 Blood Pressure(mmHg): 153/73 Body Mass Index(BMI): 29 Temperature(F): 98.3 Respiratory Rate(breaths/min): 18 Photos: [N/A:N/A] Wound Location: Right, Lateral Lower Leg N/A N/A Wounding Event: Gradually Appeared N/A N/A Primary Etiology: Venous Leg Ulcer N/A N/A Comorbid History: Cataracts, Arrhythmia, N/A N/A Hypertension Date Acquired: 04/29/2020 N/A N/A Weeks of Treatment: 11 N/A N/A Wound Status: Healed - Epithelialized N/A N/A Measurements L x W x D (cm) 0x0x0 N/A N/A Area (cm) : 0 N/A N/A Volume (cm) : 0 N/A N/A % Reduction in Area: 100.00% N/A N/A % Reduction in Volume: 100.00% N/A  N/A Classification: Full Thickness Without Exposed N/A N/A Support Structures Exudate Amount: None Present N/A N/A Granulation Amount: None Present (0%) N/A N/A Necrotic Amount: None Present (0%) N/A N/A Exposed Structures: Fascia: No N/A N/A Fat Layer (Subcutaneous Tissue): No Tendon: No Muscle: No Joint: No Bone: No Epithelialization: Large (67-100%) N/A N/A Treatment Notes Electronic Signature(s) Signed: 08/28/2020 11:41:50 AM By: Dolan Amen RN Entered By: Dolan Amen on 08/28/2020 08:47:44 [name] ([id number]) -------------------------------------------------------------------------------- Multi-Disciplinary Care Plan Details Patient Name: [name] Date of Service: 08/28/2020 8:15 AM Medical Record Number: [id number] Patient Account Number: [id number] Date of Birth/Sex: [birth year] (87 y.o. M) Treating RN: Dolan Amen Primary Care Tonda Wiederhold: Gaynelle Arabian Other Clinician: Referring Sharah Finnell: Gaynelle Arabian Treating Slayde Brault/Extender: Skipper Cliche in Treatment: 11 Active Inactive Electronic Signature(s) Signed: 08/28/2020 11:41:50 AM By: Dolan Amen  RN Entered By: Dolan Amen on 08/28/2020 08:47:37 [name] ([id number]) -------------------------------------------------------------------------------- Pain Assessment Details Patient Name: [name], [name]. Date of Service: 08/28/2020 8:15 AM Medical Record Number: [id number] Patient Account Number: [id number] Date of Birth/Sex: 17-Feb-1930 (87 y.o. M) Treating RN: Dolan Amen Primary Care Tima Curet: Gaynelle Arabian Other Clinician: Referring August Gosser: Gaynelle Arabian Treating Fredric Slabach/Extender: Skipper Cliche in Treatment: 11 Active Problems Location of Pain Severity and Description of Pain Patient Has Paino No Site Locations Rate the pain. Current Pain Level: 0 Pain Management and Medication Current Pain Management: Electronic Signature(s) Signed: 08/28/2020 11:41:50 AM  By: Dolan Amen RN Entered By: Dolan Amen on 08/28/2020 08:24:49 [name] ([id number]) -------------------------------------------------------------------------------- Patient/Caregiver Education Details Patient Name: [name] Date of Service: 08/28/2020 8:15 AM Medical Record Number: [id number] Patient Account Number: [id number] Date of Birth/Gender: [birth year] (87 y.o. M) Treating RN: Dolan Amen Primary Care Physician: Gaynelle Arabian Other Clinician: Referring Physician: Gaynelle Arabian Treating Physician/Extender: Skipper Cliche in Treatment: 11 Education Assessment Education Provided To: Patient and Caregiver son Education Topics Provided Wound/Skin Impairment: Methods: Explain/Verbal Responses: State content correctly Notes compression sock education Electronic Signature(s) Signed: 08/28/2020 11:41:50 AM By: Dolan Amen RN Entered By: Dolan Amen on 08/28/2020 08:49:21 [name] ([id number]) -------------------------------------------------------------------------------- Wound Assessment Details Patient Name: [name] Date of Service: 08/28/2020 8:15 AM Medical Record Number: [id number] Patient Account Number: [id number] Date of Birth/Sex: Aug 03, 1930 (87 y.o. M) Treating RN: Dolan Amen Primary Care Jalessa Peyser: Gaynelle Arabian Other Clinician: Referring Cassiopeia Florentino: Gaynelle Arabian Treating Jerred Zaremba/Extender: Skipper Cliche in Treatment: 11 Wound Status Wound Number: 5 Primary Etiology: Venous Leg Ulcer Wound Location: Right, Lateral Lower Leg Wound Status: Healed - Epithelialized Wounding Event: Gradually Appeared Comorbid History: Cataracts, Arrhythmia, Hypertension Date Acquired: 04/29/2020 Weeks Of Treatment: 11 Clustered Wound: No Photos Wound Measurements Length: (cm) 0 Width: (cm) 0 Depth: (cm) 0 Area: (cm) 0 Volume: (cm) 0 % Reduction in Area: 100% % Reduction in Volume: 100% Epithelialization: Large  (67-100%) Tunneling: No Undermining: No Wound Description Classification: Full Thickness Without Exposed Support Structure Exudate Amount: None Present s Foul Odor After Cleansing: No Slough/Fibrino No Wound Bed Granulation Amount: None Present (0%) Exposed Structure Necrotic Amount: None Present (0%) Fascia Exposed: No Fat Layer (Subcutaneous Tissue) Exposed: No Tendon Exposed: No Muscle Exposed: No Joint Exposed: No Bone Exposed: No Electronic Signature(s) Signed: 08/28/2020 11:41:50 AM By: Dolan Amen RN Entered By: Dolan Amen on 08/28/2020 08:47:26 [name] ([id number]) -------------------------------------------------------------------------------- Vitals Details Patient Name: [name] Date of Service: 08/28/2020 8:15 AM Medical Record Number: [id number] Patient Account Number: [id number] Date of Birth/Sex: [birth year] (87 y.o. M) Treating RN: Dolan Amen Primary Care Bernard Slayden: Gaynelle Arabian Other Clinician: Referring Alicha Raspberry: Gaynelle Arabian Treating Shanaiya Bene/Extender: Skipper Cliche in Treatment: 11 Vital Signs Time Taken: 08:24 Temperature (F): 98.3 Height (in): 71 Pulse (bpm): 93 Weight (lbs): 209 Respiratory Rate (breaths/min): 18 Body Mass Index (BMI): 29.1 Blood Pressure (mmHg): 153/73 Reference Range: 80 - 120 mg / dl Electronic Signature(s) Signed: 08/28/2020 11:41:50 AM By: Dolan Amen RN Entered By: Dolan Amen on 08/28/2020 08:24:31

## 2020-08-28 NOTE — Progress Notes (Addendum)
STORM, BRANDSMA ([id number]) Visit Report for 08/28/2020 Chief Complaint Document Details Patient Name: [name], [name]. Date of Service: 08/28/2020 8:15 AM Medical Record Number: [id number] Patient Account Number: [id number] Date of Birth/Sex: 03-09-1930 (87 y.o. M) Treating RN: Dolan Amen Primary Care Provider: Gaynelle Arabian Other Clinician: Referring Provider: Gaynelle Arabian Treating Provider/Extender: Skipper Cliche in Treatment: 11 Information Obtained from: Patient Chief Complaint Right LE Ulcers Electronic Signature(s) Signed: 08/28/2020 8:24:23 AM By: Worthy Keeler PA-C Entered By: Worthy Keeler on 08/28/2020 08:24:23 [name] ([id number]) -------------------------------------------------------------------------------- HPI Details Patient Name: [name] Date of Service: 08/28/2020 8:15 AM Medical Record Number: [id number] Patient Account Number: [id number] Date of Birth/Sex: 28-Jan-1930 (87 y.o. M) Treating RN: Dolan Amen Primary Care Provider: Gaynelle Arabian Other Clinician: Referring Provider: Gaynelle Arabian Treating Provider/Extender: Skipper Cliche in Treatment: 11 History of Present Illness HPI Description: 87 year old gentleman has been referred to [place] by his PCP Dr. Gaynelle Arabian, for a ulcer on the left posterior lower leg which has been there for about 6 weeks. The patient was thought to have an infection and was put on doxycycline for 14 days and referred to the wound center. He has a past medical history of paroxysmal atrial fibrillation, hypertension, diverticulosis, status post right hip replacement, appendectomy. He has not been a smoker since 29. the anterior shin wound has been more recent about 2 weeks and this was caused probably due to a blunt abrasion. 03/06/17 on evaluation today patient's ulcer appears to show signs of excellent improvement. He is definitely showing additional evidence of granulation which is great news.  Overall I'm extremely pleased with the progress that he has been making from the beginning to where we are right now. With that being said I would like to potentially try different dressing to see if we could stimulate this to heal even faster. 03/13/17 on evaluation today patient appears to be doing little better in regard to his leg ulcer at this time. He has been tolerating the dressing changes without complication. There does not appear to be any evidence of infection which is good news. He does have some additional granulation observed at this point and I do believe that the silver collagen dressing has been of benefit in this regard. Overall I'm very pleased with the progress she's made since last visit the only downside is that he does have additional maceration due to drainage compared to previous we may need to do something to address this. 03/27/17 on evaluation today patient appears to be doing well in regard to his left posterior lower surety ulcer. He has a significant area of epithelial growth which seems to be doing excellent at this point in time. He also seems to be well controlled as far as slough is concerned and drainage there is no evidence of maceration and there really was no slough that could not be cleaned off with saline and gauze. Overall patient seems to be making excellent progress. 04/10/17 on evaluation today patient appears to be doing better in regard to the original ulcer for which we have been treating him. Unfortunately he has two areas where he sustained injury to his posterior leg more proximal to where the current ulceration is. This was due to it appears to be tape and adhesive at this point. When he was using the Allevyn dressing there was no problems however they ran out of these and since that point there seems to have been more irritation around the wound from the  Band-Aids that his wife has been using in their place. Other than this which appear to be very  superficial new ulcers everything appears to be progressing nicely. 04/24/17 on evaluation today patient appears to be doing excellent in regard to his left posterior lower extremity ulcer. He is been tolerating the dressing changes without complication fortunately there does not appear to be any evidence of infection at this point in time. Overall he has made significant improvements since I last saw him. 05/08/17 on evaluation today patient's wound appears to be completely healed at this point. Fortunately this is great news and though it is closed over with epithelium there still some healing to be done underneath for this completely toughen up this was explained to the patient and his wife today as well. Readmission: 06/12/2020 this is a gentleman whom I have previously seen here in the clinic before in regards to similar issues with wounds on his lower extremities. 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 He is also very swollen the skin is tense and even shiny due to the fluid buildup. Obviously I think this is definitely something that we need to address sooner rather than later as far as that is concerned. Fortunately there does not appear to be any signs of active infection he is on a blood thinner, Coumadin, this is due to chronic atrial fibrillation. 06/19/2020 upon evaluation today patient's wound is actually showing signs of significant improvement. I am actually extremely pleased with where things stand he is going require little bit of debridement to clear away some of the necrotic debris on the surface of the wound he actually is okay with that he is on Coumadin selective do this carefully but nonetheless I think it is definitely achievable.. 06/26/2020 upon evaluation today patient appears to be doing well in regard to his leg ulcer. I feel like he  is showing signs of improvement and overall very pleased in this regard. Unfortunately he did have a fall where he injured his right elbow this is new and subsequently does need to be addressed as well with dressings today. Fortunately it does not appear to be too bad. 07/03/2020 upon evaluation today patient appears to be doing poorly upon inspection today. In fact we did not even get a look at his wounds because he was not acting himself at all. He was very lethargic leading to the left when he came in in his wheelchair. He did not seem to be extremely responsive which also have me very concerned. His family member who is with him states this has been going on for the last day. With that being said he is not exactly sure what is going on either. Overall the patient does not appear to be in very good shape compared to what I have previously noted. I am not exactly sure what the issue may be but I think he probably needs to be evaluated at the ER to be honest. 07/17/2020 upon evaluation today patient's wound is actually showing signs of good improvement. Is a little bit more swollen than it was previous but again I think this is secondary to the fact to be honest that he has not had the wrap on for the past week but nonetheless the wound seems to be doing great even so. He is able to get around and walk a little  bit better today which is great news as well. I am very pleased in that regard. Nonetheless I do believe that the patient is going to have to go back into the compression wrap I think he was doing a lot better as far as the healing of the wound when he was wrapped. His family members in agreement with that plan today as well. 07/24/2020 upon evaluation today patient actually appears to be doing quite well in regard to his wounds. He has been tolerating the dressing changes without complication and overall I am quite pleased actually with where things stand today. There does not appear to be any  evidence of EYAN, GUSTAVESON. ([id number]) infection which is great news and overall the patient is doing extremely well. 07/31/2020 upon evaluation today patient appears to be doing well with regard to his wound. He has been tolerating the dressing changes without complication and overall I am actually extremely pleased with where things stand today. I do believe that the patient is making excellent progress all things considered here. And his measurements today reflect that. 08/07/2020 upon evaluation today patient appears to be doing well with regard to his leg ulcer. Has been tolerating the dressing changes without complication. Fortunately there is no signs of active infection at this time which is great news. No fevers, chills, nausea, vomiting, or diarrhea. 08/21/2020 upon evaluation today patient appears to be doing well with regard to his wounds. In fact the 2 small areas that were open 1 of those is healed the other is not healed but appears to be doing quite well. There does not appear to be any signs of active infection at this time which is great news. No fevers, chills, nausea, vomiting, or diarrhea. Overall I feel like that the patient is making good progress here and in general I think that we are definitely headed in the right direction as far as trying to get the wound completely closed. 08/28/2020 upon evaluation today patient's wound actually appears to be completely healed which is great news and overall I am extremely pleased with where things stand today. Fortunately there does not appear to be any evidence of active infection which is great and overall I feel like the patient is doing awesome. They have ordered new compression socks they will not be here until probably the beginning of next week. For that reason I Georgina Peer recommend that we probably go ahead and really Laurey Arrow the application of the compression wrap today just to make sure everything stays closed. Electronic  Signature(s) Signed: 08/28/2020 9:53:54 AM By: Worthy Keeler PA-C Entered By: Worthy Keeler on 08/28/2020 09:53:54 [name] ([id number]) -------------------------------------------------------------------------------- Physical Exam Details Patient Name: [name], [name] Date of Service: 08/28/2020 8:15 AM Medical Record Number: [id number] Patient Account Number: [id number] Date of Birth/Sex: [birth year] (87 y.o. M) Treating RN: Dolan Amen Primary Care Provider: Gaynelle Arabian Other Clinician: Referring Provider: Gaynelle Arabian Treating Provider/Extender: Skipper Cliche in Treatment: 25 Constitutional Well-nourished and well-hydrated in no acute distress. Respiratory normal breathing without difficulty. Psychiatric this patient is able to make decisions and demonstrates good insight into disease process. Alert and Oriented x 3. pleasant and cooperative. Notes Patient's wound bed actually showed signs of complete epithelization and seems to be doing quite well which is great news. Overall I do not see any signs or evidence of active infection at this time. Electronic Signature(s) Signed: 08/28/2020 9:54:09 AM By: Worthy Keeler PA-C Entered By: Worthy Keeler on 08/28/2020 09:54:09  JORAH, GROSSE ([id number]) -------------------------------------------------------------------------------- Physician Orders Details Patient Name: [name], [name]. Date of Service: 08/28/2020 8:15 AM Medical Record Number: [id number] Patient Account Number: [id number] Date of Birth/Sex: 1930/12/31 (87 y.o. M) Treating RN: Dolan Amen Primary Care Provider: Gaynelle Arabian Other Clinician: Referring Provider: Gaynelle Arabian Treating Provider/Extender: Skipper Cliche in Treatment: 11 Verbal / Phone Orders: No Diagnosis Coding ICD-10 Coding Code Description I87.2 Venous insufficiency (chronic) (peripheral) I89.0 Lymphedema, not elsewhere classified L97.812 Non-pressure chronic  ulcer of other part of right lower leg with fat layer exposed S51.011A Laceration without foreign body of right elbow, initial encounter I48.0 Paroxysmal atrial fibrillation Follow-up Appointments o Return Appointment in 1 week. Bathing/ Shower/ Hygiene o May shower with wound dressing protected with water repellent cover or cast protector. Edema Control - Lymphedema / Segmental Compressive Device / Other Right Lower Extremity o Optional: One layer of unna paste to top of compression wrap (to act as an anchor). o 3 Layer Compression System for Lymphedema. o Elevate, Exercise Daily and Avoid Standing for Long Periods of Time. o Elevate legs to the level of the heart and pump ankles as often as possible o Elevate leg(s) parallel to the floor when sitting. Electronic Signature(s) Signed: 08/28/2020 11:41:50 AM By: Dolan Amen RN Signed: 08/28/2020 5:46:21 PM By: Worthy Keeler PA-C Entered By: Dolan Amen on 08/28/2020 08:48:48 [name] ([id number]) -------------------------------------------------------------------------------- Problem List Details Patient Name: [name], [name]. Date of Service: 08/28/2020 8:15 AM Medical Record Number: [id number] Patient Account Number: [id number] Date of Birth/Sex: [birth year] (87 y.o. M) Treating RN: Dolan Amen Primary Care Provider: Gaynelle Arabian Other Clinician: Referring Provider: Gaynelle Arabian Treating Provider/Extender: Skipper Cliche in Treatment: 11 Active Problems ICD-10 Encounter Code Description Active Date MDM Diagnosis I87.2 Venous insufficiency (chronic) (peripheral) 06/12/2020 No Yes I89.0 Lymphedema, not elsewhere classified 06/12/2020 No Yes L97.812 Non-pressure chronic ulcer of other part of right lower leg with fat layer 06/12/2020 No Yes exposed S51.011A Laceration without foreign body of right elbow, initial encounter 06/26/2020 No Yes I48.0 Paroxysmal atrial fibrillation 06/12/2020 No Yes Inactive  Problems Resolved Problems Electronic Signature(s) Signed: 08/28/2020 8:24:12 AM By: Worthy Keeler PA-C Entered By: Worthy Keeler on 08/28/2020 08:24:11 [name] ([id number]) -------------------------------------------------------------------------------- Progress Note Details Patient Name: [name] Date of Service: 08/28/2020 8:15 AM Medical Record Number: [id number] Patient Account Number: [id number] Date of Birth/Sex: April 02, 1930 (87 y.o. M) Treating RN: Dolan Amen Primary Care Provider: Gaynelle Arabian Other Clinician: Referring Provider: Gaynelle Arabian Treating Provider/Extender: Skipper Cliche in Treatment: 11 Subjective Chief Complaint Information obtained from Patient Right LE Ulcers History of Present Illness (HPI) 87 year old gentleman has been referred to [place] by his PCP Dr. Gaynelle Arabian, for a ulcer on the left posterior lower leg which has been there for about 6 weeks. The patient was thought to have an infection and was put on doxycycline for 14 days and referred to the wound center. He has a past medical history of paroxysmal atrial fibrillation, hypertension, diverticulosis, status post right hip replacement, appendectomy. He has not been a smoker since 73. the anterior shin wound has been more recent about 2 weeks and this was caused probably due to a blunt abrasion. 03/06/17 on evaluation today patient's ulcer appears to show signs of excellent improvement. He is definitely showing additional evidence of granulation which is great news. Overall I'm extremely pleased with the progress that he has been making from the beginning to where we are right now. With that being  said I would like to potentially try different dressing to see if we could stimulate this to heal even faster. 03/13/17 on evaluation today patient appears to be doing little better in regard to his leg ulcer at this time. He has been tolerating the dressing changes without  complication. There does not appear to be any evidence of infection which is good news. He does have some additional granulation observed at this point and I do believe that the silver collagen dressing has been of benefit in this regard. Overall I'm very pleased with the progress she's made since last visit the only downside is that he does have additional maceration due to drainage compared to previous we may need to do something to address this. 03/27/17 on evaluation today patient appears to be doing well in regard to his left posterior lower surety ulcer. He has a significant area of epithelial growth which seems to be doing excellent at this point in time. He also seems to be well controlled as far as slough is concerned and drainage there is no evidence of maceration and there really was no slough that could not be cleaned off with saline and gauze. Overall patient seems to be making excellent progress. 04/10/17 on evaluation today patient appears to be doing better in regard to the original ulcer for which we have been treating him. Unfortunately he has two areas where he sustained injury to his posterior leg more proximal to where the current ulceration is. This was due to it appears to be tape and adhesive at this point. When he was using the Allevyn dressing there was no problems however they ran out of these and since that point there seems to have been more irritation around the wound from the Band-Aids that his wife has been using in their place. Other than this which appear to be very superficial new ulcers everything appears to be progressing nicely. 04/24/17 on evaluation today patient appears to be doing excellent in regard to his left posterior lower extremity ulcer. He is been tolerating the dressing changes without complication fortunately there does not appear to be any evidence of infection at this point in time. Overall he has made significant improvements since I last saw  him. 05/08/17 on evaluation today patient's wound appears to be completely healed at this point. Fortunately this is great news and though it is closed over with epithelium there still some healing to be done underneath for this completely toughen up this was explained to the patient and his wife today as well. Readmission: 06/12/2020 this is a gentleman whom I have previously seen here in the clinic before in regards to similar issues with wounds on his lower extremities. 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 He is also very swollen the skin is tense and even shiny due to the fluid buildup. Obviously I think this is definitely something that we need to address sooner rather than later as far as that is concerned. Fortunately there does not appear to be any signs of active infection he is on a blood thinner, Coumadin, this is due to chronic atrial fibrillation. 06/19/2020 upon evaluation today patient's wound is actually showing signs of significant improvement. I am actually extremely pleased with where things stand he is going require little bit of debridement to clear away some of the necrotic debris on the  surface of the wound he actually is okay with that he is on Coumadin selective do this carefully but nonetheless I think it is definitely achievable.. 06/26/2020 upon evaluation today patient appears to be doing well in regard to his leg ulcer. I feel like he is showing signs of improvement and overall very pleased in this regard. Unfortunately he did have a fall where he injured his right elbow this is new and subsequently does need to be addressed as well with dressings today. Fortunately it does not appear to be too bad. 07/03/2020 upon evaluation today patient appears to be doing poorly upon inspection today. In fact we did not even get a look at his  wounds because he was not acting himself at all. He was very lethargic leading to the left when he came in in his wheelchair. He did not seem to be extremely responsive which also have me very concerned. His family member who is with him states this has been going on for the last day. With that being said he is not exactly sure what is going on either. Overall the patient does not appear to be in very good shape compared to what I have previously noted. I am not exactly sure what the issue may be but I think he probably needs to be evaluated at the ER to be honest. 07/17/2020 upon evaluation today patient's wound is actually showing signs of good improvement. Is a little bit more swollen than it was previous but again I think this is secondary to the fact to be honest that he has not had the wrap on for the past week but nonetheless the wound seems to be doing great even so. He is able to get around and walk a little bit better today which is great news as well. I am very pleased in that regard. [name], [name] ([id number]) Nonetheless I do believe that the patient is going to have to go back into the compression wrap I think he was doing a lot better as far as the healing of the wound when he was wrapped. His family members in agreement with that plan today as well. 07/24/2020 upon evaluation today patient actually appears to be doing quite well in regard to his wounds. He has been tolerating the dressing changes without complication and overall I am quite pleased actually with where things stand today. There does not appear to be any evidence of infection which is great news and overall the patient is doing extremely well. 07/31/2020 upon evaluation today patient appears to be doing well with regard to his wound. He has been tolerating the dressing changes without complication and overall I am actually extremely pleased with where things stand today. I do believe that the patient is making excellent  progress all things considered here. And his measurements today reflect that. 08/07/2020 upon evaluation today patient appears to be doing well with regard to his leg ulcer. Has been tolerating the dressing changes without complication. Fortunately there is no signs of active infection at this time which is great news. No fevers, chills, nausea, vomiting, or diarrhea. 08/21/2020 upon evaluation today patient appears to be doing well with regard to his wounds. In fact the 2 small areas that were open 1 of those is healed the other is not healed but appears to be doing quite well. There does not appear to be any signs of active infection at this time which is great news. No fevers, chills, nausea, vomiting, or diarrhea. Overall  I feel like that the patient is making good progress here and in general I think that we are definitely headed in the right direction as far as trying to get the wound completely closed. 08/28/2020 upon evaluation today patient's wound actually appears to be completely healed which is great news and overall I am extremely pleased with where things stand today. Fortunately there does not appear to be any evidence of active infection which is great and overall I feel like the patient is doing awesome. They have ordered new compression socks they will not be here until probably the beginning of next week. For that reason I Georgina Peer recommend that we probably go ahead and really Laurey Arrow the application of the compression wrap today just to make sure everything stays closed. Objective Constitutional Well-nourished and well-hydrated in no acute distress. Vitals Time Taken: 8:24 AM, Height: 71 in, Weight: 209 lbs, BMI: 29.1, Temperature: 98.3 F, Pulse: 93 bpm, Respiratory Rate: 18 breaths/min, Blood Pressure: 153/73 mmHg. Respiratory normal breathing without difficulty. Psychiatric this patient is able to make decisions and demonstrates good insight into disease process. Alert and  Oriented x 3. pleasant and cooperative. General Notes: Patient's wound bed actually showed signs of complete epithelization and seems to be doing quite well which is great news. Overall I do not see any signs or evidence of active infection at this time. Integumentary (Hair, Skin) Wound #5 status is Healed - Epithelialized. Original cause of wound was Gradually Appeared. The date acquired was: 04/29/2020. The wound has been in treatment 11 weeks. The wound is located on the Right,Lateral Lower Leg. The wound measures 0cm length x 0cm width x 0cm depth; 0cm^2 area and 0cm^3 volume. There is no tunneling or undermining noted. There is a none present amount of drainage noted. There is no granulation within the wound bed. There is no necrotic tissue within the wound bed. Assessment Active Problems ICD-10 Venous insufficiency (chronic) (peripheral) Lymphedema, not elsewhere classified Non-pressure chronic ulcer of other part of right lower leg with fat layer exposed Laceration without foreign body of right elbow, initial encounter Paroxysmal atrial fibrillation [name], [name]. ([id number]) Procedures There was a Three Layer Compression Therapy Procedure with a pre-treatment ABI of 1.4 by Dolan Amen, RN. Post procedure Diagnosis Wound #: Same as Pre-Procedure Plan Follow-up Appointments: Return Appointment in 1 week. Bathing/ Shower/ Hygiene: May shower with wound dressing protected with water repellent cover or cast protector. Edema Control - Lymphedema / Segmental Compressive Device / Other: Optional: One layer of unna paste to top of compression wrap (to act as an anchor). 3 Layer Compression System for Lymphedema. Elevate, Exercise Daily and Avoid Standing for Long Periods of Time. Elevate legs to the level of the heart and pump ankles as often as possible Elevate leg(s) parallel to the floor when sitting. 1. Would recommend currently that we go ahead and continue with the wound care  measures as before and the patient is in agreement with that plan. This includes the use of the compression wrap which is a 3 layer compression wrap I think that is the best way to go currently. 2. I am also can recommend that we have the patient go ahead and continue with the elevation. I think this is good for him but overall I feel confident that this is good to remain closed over the next week. He should be ready for discharge once he has a compression socks next week. We will see patient back for reevaluation in 1 week here  in the clinic. If anything worsens or changes patient will contact our office for additional recommendations. Electronic Signature(s) Signed: 08/28/2020 9:55:15 AM By: Worthy Keeler PA-C Entered By: Worthy Keeler on 08/28/2020 09:55:15 Mcdonald, Danny Massed ([id number]) -------------------------------------------------------------------------------- SuperBill Details Patient Name: [name] Date of Service: 08/28/2020 Medical Record Number: [id number] Patient Account Number: [id number] Date of Birth/Sex: 1930/10/01 (86 y.o. M) Treating RN: Dolan Amen Primary Care Provider: Gaynelle Arabian Other Clinician: Referring Provider: Gaynelle Arabian Treating Provider/Extender: Skipper Cliche in Treatment: 11 Diagnosis Coding ICD-10 Codes Code Description I87.2 Venous insufficiency (chronic) (peripheral) I89.0 Lymphedema, not elsewhere classified L97.812 Non-pressure chronic ulcer of other part of right lower leg with fat layer exposed S51.011A Laceration without foreign body of right elbow, initial encounter I48.0 Paroxysmal atrial fibrillation Facility Procedures CPT4 Code: [id number] Description: (Facility Use Only) [phone] - [place] L[facility] RT LEG Modifier: Quantity: 1 Physician Procedures CPT4 [id number] Description: [id number] - WC PHYS LEVEL 3 - EST PT Modifier: Quantity: 1 CPT4 Code: Description: ICD-10 Diagnosis Description I87.2 Venous  insufficiency (chronic) (peripheral) I89.0 Lymphedema, not elsewhere classified L97.812 Non-pressure chronic ulcer of other part of right lower leg with fat la S51.011A Laceration without foreign  body of right elbow, initial encounter Modifier: yer exposed Quantity: Electronic Signature(s) Signed: 08/28/2020 9:55:29 AM By: Worthy Keeler PA-C Entered By: Worthy Keeler on 08/28/2020 09:55:29

## 2020-08-31 ENCOUNTER — Emergency Department
Admission: EM | Admit: 2020-08-31 | Discharge: 2020-08-31 | Disposition: A | Discharge status: Home / Self Care | Payer: Medicare Other | Attending: Student in an Organized Health Care Education/Training Program | Admitting: Student in an Organized Health Care Education/Training Program

## 2020-08-31 ENCOUNTER — Emergency Department: Payer: Medicare Other

## 2020-08-31 ENCOUNTER — Other Ambulatory Visit: Payer: Self-pay

## 2020-08-31 DIAGNOSIS — Z87891 Personal history of nicotine dependence: Secondary | ICD-10-CM | POA: Insufficient documentation

## 2020-08-31 DIAGNOSIS — Z85828 Personal history of other malignant neoplasm of skin: Secondary | ICD-10-CM | POA: Insufficient documentation

## 2020-08-31 DIAGNOSIS — G8929 Other chronic pain: Secondary | ICD-10-CM | POA: Insufficient documentation

## 2020-08-31 DIAGNOSIS — I10 Essential (primary) hypertension: Secondary | ICD-10-CM | POA: Insufficient documentation

## 2020-08-31 DIAGNOSIS — M25512 Pain in left shoulder: Secondary | ICD-10-CM | POA: Diagnosis not present

## 2020-08-31 DIAGNOSIS — M19012 Primary osteoarthritis, left shoulder: Secondary | ICD-10-CM | POA: Diagnosis not present

## 2020-08-31 DIAGNOSIS — Z7982 Long term (current) use of aspirin: Secondary | ICD-10-CM | POA: Diagnosis not present

## 2020-08-31 DIAGNOSIS — Z79899 Other long term (current) drug therapy: Secondary | ICD-10-CM | POA: Insufficient documentation

## 2020-08-31 MED ORDER — TRAMADOL HCL 50 MG PO TABS: 50.0000 mg | ORAL_TABLET | Freq: Four times a day (QID) | ORAL | 0 refills | Status: DC | PRN

## 2020-08-31 MED ORDER — OXYCODONE-ACETAMINOPHEN 5-325 MG PO TABS
1.0000 | ORAL_TABLET | Freq: Once | ORAL | Status: AC
  Administered 2020-08-31: 1 via ORAL
  Filled 2020-08-31: qty 1

## 2020-08-31 MED ORDER — LIDOCAINE 5 % EX PTCH
1.0000 | MEDICATED_PATCH | CUTANEOUS | Status: DC
  Administered 2020-08-31: 1 via TRANSDERMAL
  Filled 2020-08-31: qty 1

## 2020-08-31 MED ORDER — LIDOCAINE 5 % EX PTCH: 1.0000 | MEDICATED_PATCH | Freq: Two times a day (BID) | CUTANEOUS | 0 refills | Status: DC

## 2020-08-31 NOTE — ED Provider Notes (Signed)
[facility] Emergency Department Provider Note  ____________________________________________   Event Date/Time   First MD Initiated Contact with Patient 08/31/20 1325     (approximate)  I have reviewed the triage vital signs and the nursing notes.   HISTORY  Chief Complaint Shoulder Pain    HPI [name] is a 87 y.o. male presents to the emergency department complaining of left shoulder pain.  Patient has history of chronic left shoulder pain.  Denies chest pain or shortness of breath.  States pain is with movement.  Has been following going to label.  Did have a recent cortisone injection.  His son states that he uses a walker and puts a lot of pressure on the joint.  States he gave him Tylenol which did help.  He is concerned he cannot give him anything else because of the Coumadin.  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 Authorizing Provider  lidocaine (LIDODERM) 5 % Place 1 patch onto the skin every 12 (twelve) hours. Remove & Discard patch within 12 hours or as directed by MD 08/31/20 08/31/21 Yes [place] Huberty, Linden Dolin, PA-C  traMADol (ULTRAM) 50 MG tablet Take 1 tablet (50 mg total) by mouth every 6 (six) hours as needed. 08/31/20  Yes Eilyn Polack, Linden Dolin, PA-C  acetaminophen (TYLENOL) 500 MG tablet Take 500-1,000 mg by mouth every 6 (six) hours as needed (FOR PAIN.).    [provider]  aspirin EC 81 MG tablet Take 81 mg by mouth at bedtime.    [provider]  calcium carbonate (OS-CAL) 600 MG TABS tablet Take 600 mg by mouth daily.     [provider]  Glucosamine HCl (GLUCOSAMINE PO) Take 1,500 mg by mouth daily.     [provider]  hydrochlorothiazide (HYDRODIURIL) 12.5 MG tablet Take 1 tablet (12.5 mg total) by mouth daily. 07/03/17   Belva Crome, MD  lisinopril (PRINIVIL,ZESTRIL) 10 MG tablet Take 10 mg by mouth daily.    [provider]  metoprolol succinate (TOPROL-XL) 25 MG 24 hr tablet TAKE 1 TABLET BY MOUTH DAILY WITH 50 MG TABLET FOR TOTAL DAILY DOSE OF 75 MG. TAKE WITH OR IMMEDIATELY FOLLOWING A MEAL. 07/06/20   Belva Crome, MD  metoprolol succinate (TOPROL-XL) 50 MG 24 hr tablet Take 1 tablet (50 mg total) by mouth daily. Along with a '25mg'$  tablet  for a total of '75mg'$ . Take with or immediately following a meal. 01/24/19   Belva Crome, MD  Multiple Vitamin (MULTIVITAMIN) tablet Take 1 tablet by mouth daily.    [provider]  Multiple Vitamins-Minerals (PRESERVISION AREDS 2) CAPS Take 1 capsule by mouth 2 (two) times daily.    [provider]  warfarin (COUMADIN) 5 MG tablet TAKE AS DIRECTED PER ANTICOAGULATION CLINIC 06/15/20   Belva Crome, MD    Allergies Patient has no known allergies.  Family History  Problem Relation Age of Onset   Other Mother        Brain hemmorrhage   Other Father        unknown     Social History Social History   Tobacco Use   Smoking status: Former    Packs/day: 0.25    Years: 10.00    Pack years: 2.50    Types: Cigarettes   Smokeless tobacco: Never  Vaping Use   Vaping Use: Never used  Substance Use Topics   Alcohol use: No    Alcohol/week: 0.0 standard drinks   Drug use: No    Review of Systems  Constitutional: No fever/chills Eyes: No visual changes. ENT: No sore throat. Respiratory: Denies cough Cardiovascular: Denies chest pain Gastrointestinal: Denies abdominal pain Genitourinary: Negative for dysuria. Musculoskeletal: Negative for back pain.  Positive for left shoulder pain Skin: Negative for rash. Psychiatric: no mood changes,     ____________________________________________   PHYSICAL EXAM:  VITAL SIGNS: ED Triage Vitals  Enc Vitals Group     BP 08/31/20 1156 122/78     Pulse Rate 08/31/20 1156 (!) 112     Resp 08/31/20 1157 20     Temp 08/31/20 1156 98.7 F (37.1 C)     Temp Source 08/31/20 1156 Oral     SpO2 08/31/20 1156 94 %     Weight 08/31/20 1202 200 lb (90.7 kg)     Height 08/31/20 1202 '5\' 10"'$  (1.778 m)     Head Circumference --      Peak Flow --      Pain Score 08/31/20 1202 10     Pain Loc --      Pain Edu? --      Excl. in [place]? --     Constitutional: Alert and oriented. Well appearing and in no acute distress. Eyes: Conjunctivae are normal.  Head: Atraumatic. Nose: No congestion/rhinnorhea. Mouth/Throat: Mucous membranes are moist.   Neck:  supple no lymphadenopathy noted Cardiovascular: Normal rate, regular rhythm. Heart sounds are normal Respiratory: Normal respiratory effort.  No retractions, lungs c t a  GU: deferred Musculoskeletal: FROM all extremities, warm and well perfused, pain is reproduced with range of motion of the left shoulder, joint is tender to palpation, somewhat warm to touch, neurovascular is intact Neurologic:  Normal speech and language.  Skin:  Skin is warm, dry and intact. No  rash noted. Psychiatric: Mood and affect are normal. Speech and behavior are normal.  ____________________________________________   LABS (all labs ordered are listed, but only abnormal results are displayed)  Labs Reviewed - No data to display ____________________________________________   ____________________________________________  RADIOLOGY  X-ray of the left shoulder  ____________________________________________   PROCEDURES  Procedure(s) performed: No  Procedures    ____________________________________________   INITIAL IMPRESSION / ASSESSMENT AND PLAN / ED COURSE  Pertinent labs & imaging results that were available during my care of the patient were reviewed by me and considered in my medical decision making (  see chart for details).   Patient's 87 year old male presents with left shoulder pain.  See HPI.  Physical exam shows it to be a musculoskeletal problem.  X-ray of the left shoulder reviewed by me confirmed by radiology to have very little joint space.  I did show the x-ray and explained the findings to the patient and his son.  Patient was given pain medication while here in the ED and a Lidoderm patch was applied.  They are to follow-up with orthopedics.  Return emergency department worsening.  He was discharged stable condition.     [name] was evaluated in Emergency Department on 08/31/2020 for the symptoms described in the history of present illness. He was evaluated in the context of the global COVID-19 pandemic, which necessitated consideration that the patient might be at risk for infection with the SARS-CoV-2 virus that causes COVID-19. Institutional protocols and algorithms that pertain to the evaluation of patients at risk for COVID-19 are in a state of rapid change based on information released by regulatory bodies including the CDC and federal and state organizations. These policies and algorithms were followed during the patient's care in the  ED.    As part of my medical decision making, I reviewed the following data within the [place] History obtained from family, Nursing notes reviewed and incorporated, Old chart reviewed, Radiograph reviewed , Notes from prior ED visits, and [place] Controlled Substance Database  ____________________________________________   FINAL CLINICAL IMPRESSION(S) / ED DIAGNOSES  Final diagnoses:  Chronic left shoulder pain      NEW MEDICATIONS STARTED DURING THIS VISIT:  New Prescriptions   LIDOCAINE (LIDODERM) 5 %    Place 1 patch onto the skin every 12 (twelve) hours. Remove & Discard patch within 12 hours or as directed by MD   TRAMADOL (ULTRAM) 50 MG TABLET    Take 1 tablet (50 mg total) by mouth every 6 (six) hours as needed.     Note:  This document was prepared using Dragon voice recognition software and may include unintentional dictation errors.    Versie Starks, PA-C 08/31/20 1529    Merlyn Lot, MD 08/31/20 [phone]

## 2020-08-31 NOTE — ED Triage Notes (Signed)
Pt arrived via pov with c/o left shoulder pain x2 weeks. Pt denies any known injury or falls. NAD noted at this time

## 2020-08-31 NOTE — Discharge Instructions (Addendum)
Take the medication for pain only as needed Try tylenol and the lidoderm patch first, then take tramadol for pain not controlled by these medications Return if worsening Follow up with your regular orthopedic if not improving in 3 to 4 days

## 2020-09-04 ENCOUNTER — Encounter: Payer: Medicare Other | Admitting: Physician Assistant

## 2020-09-04 ENCOUNTER — Other Ambulatory Visit: Payer: Self-pay

## 2020-09-04 DIAGNOSIS — I480 Paroxysmal atrial fibrillation: Secondary | ICD-10-CM | POA: Diagnosis not present

## 2020-09-04 DIAGNOSIS — S51011A Laceration without foreign body of right elbow, initial encounter: Secondary | ICD-10-CM | POA: Diagnosis not present

## 2020-09-04 DIAGNOSIS — I890 Lymphedema, not elsewhere classified: Secondary | ICD-10-CM | POA: Diagnosis not present

## 2020-09-04 DIAGNOSIS — L97812 Non-pressure chronic ulcer of other part of right lower leg with fat layer exposed: Secondary | ICD-10-CM | POA: Diagnosis not present

## 2020-09-04 DIAGNOSIS — I872 Venous insufficiency (chronic) (peripheral): Secondary | ICD-10-CM | POA: Diagnosis not present

## 2020-09-04 DIAGNOSIS — Z7901 Long term (current) use of anticoagulants: Secondary | ICD-10-CM | POA: Diagnosis not present

## 2020-09-04 NOTE — Progress Notes (Signed)
[name], [name] ([id number]) Visit Report for 09/04/2020 Arrival Information Details Patient Name: [name], [name]. Date of Service: 09/04/2020 8:15 AM Medical Record Number: [id number] Patient Account Number: [id number] Date of Birth/Sex: [birth year] (87 y.o. M) Treating RN: Cornell Barman Primary Care Skeeter Sheard: Gaynelle Arabian Other Clinician: Referring Nissan Frazzini: Gaynelle Arabian Treating Nohelani Benning/Extender: Skipper Cliche in Treatment: 12 Visit Information History Since Last Visit Has Dressing in Place as Prescribed: Yes Patient Arrived: Walker Has Compression in Place as Prescribed: Yes Arrival Time: 08:24 Pain Present Now: No Transfer Assistance: None Patient Identification Verified: Yes Secondary Verification Process Completed: Yes Patient Requires Transmission-Based No Precautions: Patient Has Alerts: Yes Patient Alerts: Patient on Blood Thinner NOT DIABETIC ***WARFARIN*** Electronic Signature(s) Signed: 09/04/2020 4:00:05 PM By: Gretta Cool, BSN, RN, CWS, Kim RN, BSN Entered By: Gretta Cool, BSN, RN, CWS, Kim on 09/04/2020 08:24:53 [name] ([id number]) -------------------------------------------------------------------------------- Compression Therapy Details Patient Name: [name] Date of Service: 09/04/2020 8:15 AM Medical Record Number: [id number] Patient Account Number: [id number] Date of Birth/Sex: 03-29-1930 (87 y.o. M) Treating RN: Cornell Barman Primary Care Aram Domzalski: Gaynelle Arabian Other Clinician: Referring Sevanna Ballengee: Gaynelle Arabian Treating Everlean Bucher/Extender: Skipper Cliche in Treatment: 12 Compression Therapy Performed for Wound Assessment: Wound #7 Right,Midline,Anterior Lower Leg Performed By: Clinician Cornell Barman, RN Compression Type: Three Layer Pre Treatment ABI: 1.4 Post Procedure Diagnosis Same as Pre-procedure Electronic Signature(s) Signed: 09/04/2020 4:00:05 PM By: Gretta Cool, BSN, RN, CWS, Kim RN, BSN Entered By: Gretta Cool, BSN, RN, CWS, Kim on 09/04/2020  08:50:27 [name] ([id number]) -------------------------------------------------------------------------------- Encounter Discharge Information Details Patient Name: [name], [name]. Date of Service: 09/04/2020 8:15 AM Medical Record Number: [id number] Patient Account Number: [id number] Date of Birth/Sex: 02-23-1930 (87 y.o. M) Treating RN: Cornell Barman Primary Care Arshad Oberholzer: Gaynelle Arabian Other Clinician: Referring Hansini Clodfelter: Gaynelle Arabian Treating Toluwanimi Radebaugh/Extender: Skipper Cliche in Treatment: 12 Encounter Discharge Information Items Discharge Condition: Stable Ambulatory Status: Walker Discharge Destination: Home Transportation: Private Auto Accompanied By: son Schedule Follow-up Appointment: Yes Clinical Summary of Care: Electronic Signature(s) Signed: 09/04/2020 4:00:05 PM By: Gretta Cool, BSN, RN, CWS, Kim RN, BSN Entered By: Gretta Cool, BSN, RN, CWS, Kim on 09/04/2020 09:03:16 [name] ([id number]) -------------------------------------------------------------------------------- Lower Extremity Assessment Details Patient Name: [name], [name]. Date of Service: 09/04/2020 8:15 AM Medical Record Number: [id number] Patient Account Number: [id number] Date of Birth/Sex: 1930-11-09 (87 y.o. M) Treating RN: Cornell Barman Primary Care Tayten Heber: Gaynelle Arabian Other Clinician: Referring Majesta Leichter: Gaynelle Arabian Treating Gianella Chismar/Extender: Skipper Cliche in Treatment: 12 Edema Assessment Assessed: [Left: No] [Right: No] [Left: Edema] [Right: :] Calf Left: Right: Point of Measurement: 34 cm From Medial Instep 37 cm Ankle Left: Right: Point of Measurement: 10 cm From Medial Instep 24 cm Vascular Assessment Pulses: Dorsalis Pedis Palpable: [Right:Yes] Electronic Signature(s) Signed: 09/04/2020 4:00:05 PM By: Gretta Cool, BSN, RN, CWS, Kim RN, BSN Entered By: Gretta Cool, BSN, RN, CWS, Kim on 09/04/2020 08:33:55 [name]  ([id number]) -------------------------------------------------------------------------------- Multi Wound Chart Details Patient Name: [name] Date of Service: 09/04/2020 8:15 AM Medical Record Number: [id number] Patient Account Number: [id number] Date of Birth/Sex: 10-03-1930 (87 y.o. M) Treating RN: Cornell Barman Primary Care Arletha Marschke: Gaynelle Arabian Other Clinician: Referring Bodi Palmeri: Gaynelle Arabian Treating Sunnie Odden/Extender: Skipper Cliche in Treatment: 12 Vital Signs Height(in): 71 Pulse(bpm): 109 Weight(lbs): 209 Blood Pressure(mmHg): 163/87 Body Mass Index(BMI): 29 Temperature(F): 97.8 Respiratory Rate(breaths/min): 18 Photos: [N/A:N/A] Wound Location: Right, Midline, Anterior Lower Leg N/A N/A Wounding Event: Trauma N/A N/A Primary Etiology: Trauma, Other N/A N/A Comorbid History: Cataracts, Arrhythmia, N/A N/A Hypertension  Date Acquired: 09/03/2020 N/A N/A Weeks of Treatment: 0 N/A N/A Wound Status: Open N/A N/A Measurements L x W x D (cm) 3.2x1.5x0.1 N/A N/A Area (cm) : 3.77 N/A N/A Volume (cm) : 0.377 N/A N/A % Reduction in Area: 0.00% N/A N/A % Reduction in Volume: 0.00% N/A N/A Classification: Full Thickness Without Exposed N/A N/A Support Structures Exudate Amount: Medium N/A N/A Exudate Type: Sanguinous N/A N/A Exudate Color: red N/A N/A Wound Margin: Flat and Intact N/A N/A Granulation Amount: Large (67-100%) N/A N/A Necrotic Amount: None Present (0%) N/A N/A Exposed Structures: Fat Layer (Subcutaneous Tissue): N/A N/A Yes Fascia: No Tendon: No Muscle: No Joint: No Bone: No Treatment Notes Electronic Signature(s) Signed: 09/04/2020 4:00:05 PM By: Gretta Cool, BSN, RN, CWS, Kim RN, BSN Entered By: Gretta Cool, BSN, RN, CWS, Kim on 09/04/2020 08:49:49 [name] ([id number]) -------------------------------------------------------------------------------- Multi-Disciplinary Care Plan Details Patient Name: [name], [name]. Date of Service:  09/04/2020 8:15 AM Medical Record Number: [id number] Patient Account Number: [id number] Date of Birth/Sex: Mar 06, 1930 (87 y.o. M) Treating RN: Cornell Barman Primary Care : Gaynelle Arabian Other Clinician: Referring : Gaynelle Arabian Treating /Extender: Skipper Cliche in Treatment: 12 Active Inactive Abuse / Safety / Falls / Self Care Management Nursing Diagnoses: History of Falls Goals: Patient will not experience any injury related to falls Date Initiated: 09/04/2020 Target Resolution Date: 09/11/2020 Goal Status: Active Patient will remain injury free related to falls Date Initiated: 09/04/2020 Target Resolution Date: 09/11/2020 Goal Status: Active Interventions: Assess fall risk on admission and as needed Notes: Wound/Skin Impairment Nursing Diagnoses: Knowledge deficit related to ulceration/compromised skin integrity Goals: Patient/caregiver will verbalize understanding of skin care regimen Date Initiated: 06/12/2020 Target Resolution Date: 10/05/2020 Goal Status: Active Ulcer/skin breakdown will have a volume reduction of 30% by week 4 Date Initiated: 06/12/2020 Target Resolution Date: 10/05/2020 Goal Status: Active Ulcer/skin breakdown will have a volume reduction of 50% by week 8 Date Initiated: 06/12/2020 Date Inactivated: 08/21/2020 Target Resolution Date: 08/12/2020 Goal Status: Met Ulcer/skin breakdown will have a volume reduction of 80% by week 12 Date Initiated: 06/12/2020 Date Inactivated: 08/21/2020 Target Resolution Date: 09/12/2020 Goal Status: Met Ulcer/skin breakdown will heal within 14 weeks Date Initiated: 06/12/2020 Date Inactivated: 08/28/2020 Target Resolution Date: 10/12/2020 Goal Status: Met Interventions: Assess patient/caregiver ability to obtain necessary supplies Assess patient/caregiver ability to perform ulcer/skin care regimen upon admission and as needed Assess ulceration(s) every visit Notes: Electronic Signature(s) Signed:  09/04/2020 4:00:05 PM By: Gretta Cool, BSN, RN, CWS, Kim RN, BSN Entered By: Gretta Cool, BSN, RN, CWS, Kim on 09/04/2020 08:49:42 [name] ([id number]) -------------------------------------------------------------------------------- Pain Assessment Details Patient Name: [name] Date of Service: 09/04/2020 8:15 AM Medical Record Number: [id number] Patient Account Number: [id number] Date of Birth/Sex: March 23, 1930 (87 y.o. M) Treating RN: Cornell Barman Primary Care : Gaynelle Arabian Other Clinician: Referring : Gaynelle Arabian Treating /Extender: Skipper Cliche in Treatment: 12 Active Problems Location of Pain Severity and Description of Pain Patient Has Paino No Site Locations Pain Management and Medication Current Pain Management: Electronic Signature(s) Signed: 09/04/2020 4:00:05 PM By: Gretta Cool, BSN, RN, CWS, Kim RN, BSN Entered By: Gretta Cool, BSN, RN, CWS, Kim on 09/04/2020 08:30:09 [name] ([id number]) -------------------------------------------------------------------------------- Patient/Caregiver Education Details Patient Name: [name] Date of Service: 09/04/2020 8:15 AM Medical Record Number: [id number] Patient Account Number: [id number] Date of Birth/Gender: 02-Dec-1930 (87 y.o. M) Treating RN: Cornell Barman Primary Care Physician: Gaynelle Arabian Other Clinician: Referring Physician: Gaynelle Arabian Treating Physician/Extender: Skipper Cliche in Treatment: 12 Education Assessment  Education Provided To: Patient Education Topics Provided Venous: Handouts: Controlling Swelling with Multilayered Compression Wraps Methods: Demonstration, Explain/Verbal Responses: State content correctly Wound/Skin Impairment: Handouts: Caring for Your Ulcer, Other: wound care as prescribed Methods: Demonstration, Explain/Verbal Responses: State content correctly Electronic Signature(s) Signed: 09/04/2020 4:00:05 PM By: Gretta Cool, BSN, RN, CWS, Kim RN,  BSN Entered By: Gretta Cool, BSN, RN, CWS, Kim on 09/04/2020 09:02:43 [name] ([id number]) -------------------------------------------------------------------------------- Wound Assessment Details Patient Name: [name], [name]. Date of Service: 09/04/2020 8:15 AM Medical Record Number: [id number] Patient Account Number: [id number] Date of Birth/Sex: 03-Jul-1930 (87 y.o. M) Treating RN: Cornell Barman Primary Care : Gaynelle Arabian Other Clinician: Referring : Gaynelle Arabian Treating /Extender: Skipper Cliche in Treatment: 12 Wound Status Wound Number: 7 Primary Etiology: Trauma, Other Wound Location: Right, Midline, Anterior Lower Leg Wound Status: Open Wounding Event: Trauma Comorbid History: Cataracts, Arrhythmia, Hypertension Date Acquired: 09/03/2020 Weeks Of Treatment: 0 Clustered Wound: No Photos Wound Measurements Length: (cm) 3.2 Width: (cm) 1.5 Depth: (cm) 0.1 Area: (cm) 3.77 Volume: (cm) 0.377 % Reduction in Area: 0% % Reduction in Volume: 0% Wound Description Classification: Full Thickness Without Exposed Support Structu Wound Margin: Flat and Intact Exudate Amount: Medium Exudate Type: Sanguinous Exudate Color: red res Foul Odor After Cleansing: No Slough/Fibrino No Wound Bed Granulation Amount: Large (67-100%) Exposed Structure Necrotic Amount: None Present (0%) Fascia Exposed: No Fat Layer (Subcutaneous Tissue) Exposed: Yes Tendon Exposed: No Muscle Exposed: No Joint Exposed: No Bone Exposed: No Treatment Notes Wound #7 (Lower Leg) Wound Laterality: Right, Midline, Anterior Cleanser Peri-Wound Care Topical CARDALE, DORER ([id number]) Primary Dressing Xeroform-HBD 2x2 (in/in) Discharge Instruction: Apply Xeroform-HBD 2x2 (in/in) as directed Secondary Dressing ABD Pad 5x9 (in/in) Discharge Instruction: Cover with ABD pad Secured With Compression Wrap Profore Lite LF 3 Multilayer Compression Bandaging System Discharge  Instruction: Apply 3 multi-layer wrap as prescribed. Compression Stockings Environmental education officer) Signed: 09/04/2020 4:00:05 PM By: Gretta Cool, BSN, RN, CWS, Kim RN, BSN Entered By: Gretta Cool, BSN, RN, CWS, Kim on 09/04/2020 08:32:58 [name] ([id number]) -------------------------------------------------------------------------------- Vitals Details Patient Name: [name] Date of Service: 09/04/2020 8:15 AM Medical Record Number: [id number] Patient Account Number: [id number] Date of Birth/Sex: 1930-08-11 (87 y.o. M) Treating RN: Cornell Barman Primary Care : Gaynelle Arabian Other Clinician: Referring : Gaynelle Arabian Treating /Extender: Skipper Cliche in Treatment: 12 Vital Signs Time Taken: 08:24 Temperature (F): 97.8 Height (in): 71 Pulse (bpm): 109 Weight (lbs): 209 Respiratory Rate (breaths/min): 18 Body Mass Index (BMI): 29.1 Blood Pressure (mmHg): 163/87 Reference Range: 80 - 120 mg / dl Electronic Signature(s) Signed: 09/04/2020 4:00:05 PM By: Gretta Cool, BSN, RN, CWS, Kim RN, BSN Entered By: Gretta Cool, BSN, RN, CWS, Kim on 09/04/2020 08:30:01
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
## 2020-09-11 ENCOUNTER — Other Ambulatory Visit: Payer: Self-pay

## 2020-09-11 ENCOUNTER — Encounter: Payer: Medicare Other | Attending: Physician Assistant | Admitting: Physician Assistant

## 2020-09-11 DIAGNOSIS — F17200 Nicotine dependence, unspecified, uncomplicated: Secondary | ICD-10-CM | POA: Insufficient documentation

## 2020-09-11 DIAGNOSIS — X58XXXA Exposure to other specified factors, initial encounter: Secondary | ICD-10-CM | POA: Diagnosis not present

## 2020-09-11 DIAGNOSIS — L97812 Non-pressure chronic ulcer of other part of right lower leg with fat layer exposed: Secondary | ICD-10-CM | POA: Insufficient documentation

## 2020-09-11 DIAGNOSIS — S51011A Laceration without foreign body of right elbow, initial encounter: Secondary | ICD-10-CM | POA: Diagnosis not present

## 2020-09-11 DIAGNOSIS — I480 Paroxysmal atrial fibrillation: Secondary | ICD-10-CM | POA: Diagnosis not present

## 2020-09-11 DIAGNOSIS — I890 Lymphedema, not elsewhere classified: Secondary | ICD-10-CM | POA: Insufficient documentation

## 2020-09-11 DIAGNOSIS — Z96641 Presence of right artificial hip joint: Secondary | ICD-10-CM | POA: Diagnosis not present

## 2020-09-11 DIAGNOSIS — I872 Venous insufficiency (chronic) (peripheral): Secondary | ICD-10-CM | POA: Insufficient documentation

## 2020-09-11 DIAGNOSIS — I10 Essential (primary) hypertension: Secondary | ICD-10-CM | POA: Diagnosis not present

## 2020-09-11 DIAGNOSIS — K5790 Diverticulosis of intestine, part unspecified, without perforation or abscess without bleeding: Secondary | ICD-10-CM | POA: Diagnosis not present

## 2020-09-11 DIAGNOSIS — Z7901 Long term (current) use of anticoagulants: Secondary | ICD-10-CM | POA: Diagnosis not present
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
## 2020-09-12 ENCOUNTER — Other Ambulatory Visit: Payer: Self-pay | Admitting: Interventional Cardiology

## 2020-09-15 NOTE — Telephone Encounter (Signed)
Prescription refill request received for warfarin Lov: 01/24/20 (Pt scheduled for office visit with [facility] on 10/15/20)  Next INR check: 09/16/20 Warfarin tablet strength: '5mg'$   Appropriate dose and refill sent to requested pharmacy.

## 2020-09-16 ENCOUNTER — Other Ambulatory Visit: Payer: Self-pay

## 2020-09-16 ENCOUNTER — Ambulatory Visit: Payer: Medicare Other | Admitting: *Deleted

## 2020-09-16 DIAGNOSIS — I4891 Unspecified atrial fibrillation: Secondary | ICD-10-CM | POA: Diagnosis not present

## 2020-09-16 DIAGNOSIS — I6789 Other cerebrovascular disease: Secondary | ICD-10-CM

## 2020-09-16 DIAGNOSIS — Z5181 Encounter for therapeutic drug level monitoring: Secondary | ICD-10-CM

## 2020-09-16 LAB — POCT INR: INR: 2.5 (ref 2.0–3.0)

## 2020-09-16 NOTE — Patient Instructions (Signed)
Description   Continue taking Warfarin 1 tablet daily except 1.5 tablets on Mondays. Recheck in 5 weeks. Call us with any medication changes [phone]

## 2020-09-18 ENCOUNTER — Other Ambulatory Visit: Payer: Self-pay

## 2020-09-18 ENCOUNTER — Encounter: Payer: Medicare Other | Admitting: Physician Assistant

## 2020-09-18 DIAGNOSIS — I480 Paroxysmal atrial fibrillation: Secondary | ICD-10-CM | POA: Diagnosis not present

## 2020-09-18 DIAGNOSIS — Z96641 Presence of right artificial hip joint: Secondary | ICD-10-CM | POA: Diagnosis not present

## 2020-09-18 DIAGNOSIS — L97812 Non-pressure chronic ulcer of other part of right lower leg with fat layer exposed: Secondary | ICD-10-CM | POA: Diagnosis not present

## 2020-09-18 DIAGNOSIS — S51011A Laceration without foreign body of right elbow, initial encounter: Secondary | ICD-10-CM | POA: Diagnosis not present

## 2020-09-18 DIAGNOSIS — F17200 Nicotine dependence, unspecified, uncomplicated: Secondary | ICD-10-CM | POA: Diagnosis not present

## 2020-09-18 DIAGNOSIS — I890 Lymphedema, not elsewhere classified: Secondary | ICD-10-CM | POA: Diagnosis not present

## 2020-09-18 DIAGNOSIS — K5790 Diverticulosis of intestine, part unspecified, without perforation or abscess without bleeding: Secondary | ICD-10-CM | POA: Diagnosis not present

## 2020-09-18 DIAGNOSIS — I10 Essential (primary) hypertension: Secondary | ICD-10-CM | POA: Diagnosis not present

## 2020-09-18 DIAGNOSIS — I872 Venous insufficiency (chronic) (peripheral): Secondary | ICD-10-CM | POA: Diagnosis not present

## 2020-09-18 DIAGNOSIS — Z7901 Long term (current) use of anticoagulants: Secondary | ICD-10-CM | POA: Diagnosis not present

## 2020-09-18 NOTE — Progress Notes (Addendum)
UNDRAY, COLMENERO ([id number]) Visit Report for 09/18/2020 Arrival Information Details Patient Name: [name], [name]. Date of Service: 09/18/2020 1:15 PM Medical Record Number: [id number] Patient Account Number: [id number] Date of Birth/Sex: 1930/07/15 (87 y.o. M) Treating RN: Carlene Coria Primary Care Loni Abdon: Gaynelle Arabian Other Clinician: Referring Dailin Sosnowski: Gaynelle Arabian Treating Adlean Hardeman/Extender: Skipper Cliche in Treatment: 14 Visit Information History Since Last Visit All ordered tests and consults were completed: No Patient Arrived: [name] Added or deleted any medications: No Arrival Time: 13:26 Any new allergies or adverse reactions: No Accompanied By: son Had a fall or experienced change in No Transfer Assistance: None activities of daily living that may affect Patient Identification Verified: Yes risk of falls: Secondary Verification Process Completed: Yes Signs or symptoms of abuse/neglect since last visito No Patient Requires Transmission-Based No Hospitalized since last visit: No Precautions: Implantable device outside of the clinic excluding No Patient Has Alerts: Yes cellular tissue based products placed in the center Patient Alerts: Patient on Blood since last visit: Thinner Has Dressing in Place as Prescribed: Yes NOT DIABETIC Has Compression in Place as Prescribed: Yes ***WARFARIN*** Pain Present Now: No Electronic Signature(s) Signed: 09/23/2020 7:54:12 AM By: Carlene Coria RN Entered By: Carlene Coria on 09/18/2020 13:31:25 [name] ([id number]) -------------------------------------------------------------------------------- Clinic Level of Care Assessment Details Patient Name: [name] Date of Service: 09/18/2020 1:15 PM Medical Record Number: [id number] Patient Account Number: [id number] Date of Birth/Sex: [birth year] (87 y.o. M) Treating RN: Carlene Coria Primary Care Lorrin Nawrot: Gaynelle Arabian Other Clinician: Referring Ved Martos: Gaynelle Arabian Treating Lexxus Underhill/Extender: Skipper Cliche in Treatment: 14 Clinic Level of Care Assessment Items TOOL 4 Quantity Score X - Use when only an EandM is performed on FOLLOW-UP visit 1 0 ASSESSMENTS - Nursing Assessment / Reassessment X - Reassessment of Co-morbidities (includes updates in patient status) 1 10 X- 1 5 Reassessment of Adherence to Treatment Plan ASSESSMENTS - Wound and Skin Assessment / Reassessment X - Simple Wound Assessment / Reassessment - one wound 1 5 '[]'$  - 0 Complex Wound Assessment / Reassessment - multiple wounds '[]'$  - 0 Dermatologic / Skin Assessment (not related to wound area) ASSESSMENTS - Focused Assessment '[]'$  - Circumferential Edema Measurements - multi extremities 0 '[]'$  - 0 Nutritional Assessment / Counseling / Intervention '[]'$  - 0 Lower Extremity Assessment (monofilament, tuning fork, pulses) '[]'$  - 0 Peripheral Arterial Disease Assessment (using hand held doppler) ASSESSMENTS - Ostomy and/or Continence Assessment and Care '[]'$  - Incontinence Assessment and Management 0 '[]'$  - 0 Ostomy Care Assessment and Management (repouching, etc.) PROCESS - Coordination of Care X - Simple Patient / Family Education for ongoing care 1 15 '[]'$  - 0 Complex (extensive) Patient / Family Education for ongoing care '[]'$  - 0 Staff obtains Programmer, systems, Records, Test Results / Process Orders '[]'$  - 0 Staff telephones HHA, Nursing Homes / Clarify orders / etc '[]'$  - 0 Routine Transfer to another Facility (non-emergent condition) '[]'$  - 0 Routine Hospital Admission (non-emergent condition) '[]'$  - 0 New Admissions / Biomedical engineer / Ordering NPWT, Apligraf, etc. '[]'$  - 0 Emergency Hospital Admission (emergent condition) X- 1 10 Simple Discharge Coordination '[]'$  - 0 Complex (extensive) Discharge Coordination PROCESS - Special Needs '[]'$  - Pediatric / Minor Patient Management 0 '[]'$  - 0 Isolation Patient Management '[]'$  - 0 Hearing / Language / Visual special needs '[]'$  -  0 Assessment of Community assistance (transportation, D/C planning, etc.) '[]'$  - 0 Additional assistance / Altered mentation '[]'$  - 0 Support Surface(s) Assessment (bed, cushion, seat, etc.) INTERVENTIONS -  Wound Cleansing / Measurement [name], [name] ([id number]) X- 1 5 Simple Wound Cleansing - one wound '[]'$  - 0 Complex Wound Cleansing - multiple wounds X- 1 5 Wound Imaging (photographs - any number of wounds) '[]'$  - 0 Wound Tracing (instead of photographs) X- 1 5 Simple Wound Measurement - one wound '[]'$  - 0 Complex Wound Measurement - multiple wounds INTERVENTIONS - Wound Dressings '[]'$  - Small Wound Dressing one or multiple wounds 0 '[]'$  - 0 Medium Wound Dressing one or multiple wounds '[]'$  - 0 Large Wound Dressing one or multiple wounds '[]'$  - 0 Application of Medications - topical '[]'$  - 0 Application of Medications - injection INTERVENTIONS - Miscellaneous '[]'$  - External ear exam 0 '[]'$  - 0 Specimen Collection (cultures, biopsies, blood, body fluids, etc.) '[]'$  - 0 Specimen(s) / Culture(s) sent or taken to Lab for analysis '[]'$  - 0 Patient Transfer (multiple staff / Civil Service fast streamer / Similar devices) '[]'$  - 0 Simple Staple / Suture removal (25 or less) '[]'$  - 0 Complex Staple / Suture removal (26 or more) '[]'$  - 0 Hypo / Hyperglycemic Management (close monitor of Blood Glucose) '[]'$  - 0 Ankle / Brachial Index (ABI) - do not check if billed separately X- 1 5 Vital Signs Has the patient been seen at the hospital within the last three years: Yes Total Score: 65 Level Of Care: New/Established - Level 2 Electronic Signature(s) Signed: 09/23/2020 7:54:12 AM By: Carlene Coria RN Entered By: Carlene Coria on 09/18/2020 14:04:08 [name] ([id number]) -------------------------------------------------------------------------------- Encounter Discharge Information Details Patient Name: [name] Date of Service: 09/18/2020 1:15 PM Medical Record Number: [id number] Patient Account Number:  [id number] Date of Birth/Sex: Feb 07, 1930 (87 y.o. M) Treating RN: Carlene Coria Primary Care Scharlene Catalina: Gaynelle Arabian Other Clinician: Referring Kelechi Astarita: Gaynelle Arabian Treating Thijs Brunton/Extender: Skipper Cliche in Treatment: 14 Encounter Discharge Information Items Discharge Condition: Stable Ambulatory Status: Walker Discharge Destination: Home Transportation: Private Auto Accompanied By: son Schedule Follow-up Appointment: Yes Clinical Summary of Care: Patient Declined Electronic Signature(s) Signed: 09/23/2020 7:54:12 AM By: Carlene Coria RN Entered By: Carlene Coria on 09/18/2020 14:04:58 [name] ([id number]) -------------------------------------------------------------------------------- Lower Extremity Assessment Details Patient Name: [name] Date of Service: 09/18/2020 1:15 PM Medical Record Number: [id number] Patient Account Number: [id number] Date of Birth/Sex: [birth year] (87 y.o. M) Treating RN: Carlene Coria Primary Care Rocko Fesperman: Gaynelle Arabian Other Clinician: Referring Adisyn Ruscitti: Gaynelle Arabian Treating Danile Trier/Extender: Skipper Cliche in Treatment: 14 Edema Assessment Assessed: [Left: No] [name]: No] Edema: [Left: N] [Right: o] Calf Left: Right: Point of Measurement: 34 cm From Medial Instep 35 cm Ankle Left: Right: Point of Measurement: 10 cm From Medial Instep 24 cm Vascular Assessment Pulses: Dorsalis Pedis Palpable: [Right:Yes] Electronic Signature(s) Signed: 09/23/2020 7:54:12 AM By: Carlene Coria RN Entered By: Carlene Coria on 09/18/2020 13:45:16 [name] ([id number]) -------------------------------------------------------------------------------- Multi Wound Chart Details Patient Name: [name] Date of Service: 09/18/2020 1:15 PM Medical Record Number: [id number] Patient Account Number: [id number] Date of Birth/Sex: 1930-03-29 (87 y.o. M) Treating RN: Carlene Coria Primary Care Kentley Cedillo: Gaynelle Arabian Other  Clinician: Referring Luisantonio Adinolfi: Gaynelle Arabian Treating Romell Cavanah/Extender: Skipper Cliche in Treatment: 14 Vital Signs Height(in): 71 Pulse(bpm): 93 Weight(lbs): 209 Blood Pressure(mmHg): 175/79 Body Mass Index(BMI): 29 Temperature(F): 97.7 Respiratory Rate(breaths/min): 18 Photos: [N/A:N/A] Wound Location: Right, Midline, Anterior Lower Leg N/A N/A Wounding Event: Trauma N/A N/A Primary Etiology: Trauma, Other N/A N/A Comorbid History: Cataracts, Arrhythmia, N/A N/A Hypertension Date Acquired: 09/03/2020 N/A N/A Weeks of Treatment: 2 N/A N/A Wound Status:  Open N/A N/A Measurements L x W x D (cm) 0x0x0 N/A N/A Area (cm) : 0 N/A N/A Volume (cm) : 0 N/A N/A % Reduction in Area: 100.00% N/A N/A % Reduction in Volume: 100.00% N/A N/A Classification: Full Thickness Without Exposed N/A N/A Support Structures Exudate Amount: Small N/A N/A Exudate Type: Sanguinous N/A N/A Exudate Color: red N/A N/A Wound Margin: Flat and Intact N/A N/A Granulation Amount: None Present (0%) N/A N/A Necrotic Amount: None Present (0%) N/A N/A Exposed Structures: Fascia: No N/A N/A Fat Layer (Subcutaneous Tissue): No Tendon: No Muscle: No Joint: No Bone: No Epithelialization: Large (67-100%) N/A N/A Treatment Notes Electronic Signature(s) Signed: 09/23/2020 7:54:12 AM By: Carlene Coria RN Entered By: Carlene Coria on 09/18/2020 13:52:36 [name] ([id number]) -------------------------------------------------------------------------------- Multi-Disciplinary Care Plan Details Patient Name: [name] Date of Service: 09/18/2020 1:15 PM Medical Record Number: [id number] Patient Account Number: [id number] Date of Birth/Sex: 02-25-1930 (87 y.o. M) Treating RN: Carlene Coria Primary Care Jamarrion Budai: Gaynelle Arabian Other Clinician: Referring Aizah Gehlhausen: Gaynelle Arabian Treating Shatana Saxton/Extender: Skipper Cliche in Treatment: 14 Active Inactive Electronic Signature(s) Signed:  09/23/2020 7:54:12 AM By: Carlene Coria RN Entered By: Carlene Coria on 09/18/2020 14:06:23 [name] ([id number]) -------------------------------------------------------------------------------- Pain Assessment Details Patient Name: [name] Date of Service: 09/18/2020 1:15 PM Medical Record Number: [id number] Patient Account Number: [id number] Date of Birth/Sex: 1930-11-28 (87 y.o. M) Treating RN: Carlene Coria Primary Care Azlan Hanway: Gaynelle Arabian Other Clinician: Referring Xandra Laramee: Gaynelle Arabian Treating Lanayah Gartley/Extender: Skipper Cliche in Treatment: 14 Active Problems Location of Pain Severity and Description of Pain Patient Has Paino No Site Locations Pain Management and Medication Current Pain Management: Electronic Signature(s) Signed: 09/23/2020 7:54:12 AM By: Carlene Coria RN Entered By: Carlene Coria on 09/18/2020 13:32:08 [name] ([id number]) -------------------------------------------------------------------------------- Patient/Caregiver Education Details Patient Name: [name] Date of Service: 09/18/2020 1:15 PM Medical Record Number: [id number] Patient Account Number: [id number] Date of Birth/Gender: 04/03/1930 (87 y.o. M) Treating RN: Carlene Coria Primary Care Physician: Gaynelle Arabian Other Clinician: Referring Physician: Gaynelle Arabian Treating Physician/Extender: Skipper Cliche in Treatment: 14 Education Assessment Education Provided To: Patient Education Topics Provided Wound/Skin Impairment: Methods: Explain/Verbal Responses: State content correctly Electronic Signature(s) Signed: 09/23/2020 7:54:12 AM By: Carlene Coria RN Entered By: Carlene Coria on 09/18/2020 14:04:23 [name] ([id number]) -------------------------------------------------------------------------------- Wound Assessment Details Patient Name: [name] Date of Service: 09/18/2020 1:15 PM Medical Record Number: [id number] Patient Account Number:  [id number] Date of Birth/Sex: 08-27-1930 (87 y.o. M) Treating RN: Carlene Coria Primary Care Marymargaret Kirker: Gaynelle Arabian Other Clinician: Referring Lupita Rosales: Gaynelle Arabian Treating Thomasena Vandenheuvel/Extender: Skipper Cliche in Treatment: 14 Wound Status Wound Number: 7 Primary Etiology: Trauma, Other Wound Location: Right, Midline, Anterior Lower Leg Wound Status: Healed - Epithelialized Wounding Event: Trauma Comorbid History: Cataracts, Arrhythmia, Hypertension Date Acquired: 09/03/2020 Weeks Of Treatment: 2 Clustered Wound: No Photos Wound Measurements Length: (cm) 0 Width: (cm) 0 Depth: (cm) 0 Area: (cm) Volume: (cm) % Reduction in Area: 100% % Reduction in Volume: 100% Epithelialization: Large (67-100%) 0 Tunneling: No 0 Undermining: No Wound Description Classification: Full Thickness Without Exposed Support Structu Wound Margin: Flat and Intact Exudate Amount: Small Exudate Type: Sanguinous Exudate Color: red res Foul Odor After Cleansing: No Slough/Fibrino No Wound Bed Granulation Amount: None Present (0%) Exposed Structure Necrotic Amount: None Present (0%) Fascia Exposed: No Fat Layer (Subcutaneous Tissue) Exposed: No Tendon Exposed: No Muscle Exposed: No Joint Exposed: No Bone Exposed: No Treatment Notes Wound #7 (Lower Leg) Wound Laterality: Right,  Midline, Anterior Cleanser Peri-Wound Care Topical [name], [name] ([id number]) Primary Dressing Secondary Dressing Secured With Compression Wrap Compression Stockings Add-Ons Electronic Signature(s) Signed: 09/23/2020 7:54:12 AM By: Carlene Coria RN Entered By: Carlene Coria on 09/18/2020 14:03:36 [name] ([id number]) -------------------------------------------------------------------------------- Vitals Details Patient Name: [name] Date of Service: 09/18/2020 1:15 PM Medical Record Number: [id number] Patient Account Number: [id number] Date of Birth/Sex: 13-Feb-1930 (87 y.o. M) Treating RN:  Carlene Coria Primary Care Darbie Biancardi: Gaynelle Arabian Other Clinician: Referring Holten Spano: Gaynelle Arabian Treating Tareka Jhaveri/Extender: Skipper Cliche in Treatment: 14 Vital Signs Time Taken: 15:31 Temperature (F): 97.7 Height (in): 71 Pulse (bpm): 93 Weight (lbs): 209 Respiratory Rate (breaths/min): 18 Body Mass Index (BMI): 29.1 Blood Pressure (mmHg): 175/79 Reference Range: 80 - 120 mg / dl Electronic Signature(s) Signed: 09/23/2020 7:54:12 AM By: Carlene Coria RN Entered By: Carlene Coria on 09/18/2020 13:32:01
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
## 2020-09-25 ENCOUNTER — Ambulatory Visit: Payer: Medicare Other | Admitting: Physician Assistant

## 2020-09-26 ENCOUNTER — Other Ambulatory Visit: Payer: Self-pay | Admitting: Interventional Cardiology

## 2020-10-02 ENCOUNTER — Ambulatory Visit: Payer: Medicare Other | Admitting: Physician Assistant

## 2020-10-03 ENCOUNTER — Other Ambulatory Visit: Payer: Self-pay | Admitting: Interventional Cardiology

## 2020-10-09 ENCOUNTER — Ambulatory Visit: Payer: Medicare Other | Admitting: Physician Assistant

## 2020-10-13 ENCOUNTER — Inpatient Hospital Stay (HOSPITAL_COMMUNITY)
Admission: EM | Admit: 2020-10-13 | Discharge: 2020-10-22 | DRG: 291 | Disposition: A | Discharge status: Skilled Nursing Facility | Payer: Medicare Other | Attending: Internal Medicine | Admitting: Internal Medicine

## 2020-10-13 ENCOUNTER — Other Ambulatory Visit: Payer: Self-pay

## 2020-10-13 ENCOUNTER — Emergency Department (HOSPITAL_COMMUNITY): Payer: Medicare Other

## 2020-10-13 DIAGNOSIS — W19XXXA Unspecified fall, initial encounter: Secondary | ICD-10-CM | POA: Diagnosis not present

## 2020-10-13 DIAGNOSIS — I5043 Acute on chronic combined systolic (congestive) and diastolic (congestive) heart failure: Secondary | ICD-10-CM | POA: Diagnosis not present

## 2020-10-13 DIAGNOSIS — F0392 Unspecified dementia, unspecified severity, with psychotic disturbance: Secondary | ICD-10-CM | POA: Diagnosis not present

## 2020-10-13 DIAGNOSIS — Z515 Encounter for palliative care: Secondary | ICD-10-CM | POA: Diagnosis not present

## 2020-10-13 DIAGNOSIS — R17 Unspecified jaundice: Secondary | ICD-10-CM | POA: Diagnosis not present

## 2020-10-13 DIAGNOSIS — Z20822 Contact with and (suspected) exposure to covid-19: Secondary | ICD-10-CM | POA: Diagnosis not present

## 2020-10-13 DIAGNOSIS — Y92009 Unspecified place in unspecified non-institutional (private) residence as the place of occurrence of the external cause: Secondary | ICD-10-CM | POA: Diagnosis not present

## 2020-10-13 DIAGNOSIS — Z8673 Personal history of transient ischemic attack (TIA), and cerebral infarction without residual deficits: Secondary | ICD-10-CM

## 2020-10-13 DIAGNOSIS — I872 Venous insufficiency (chronic) (peripheral): Secondary | ICD-10-CM | POA: Diagnosis not present

## 2020-10-13 DIAGNOSIS — Z9049 Acquired absence of other specified parts of digestive tract: Secondary | ICD-10-CM

## 2020-10-13 DIAGNOSIS — Z7982 Long term (current) use of aspirin: Secondary | ICD-10-CM

## 2020-10-13 DIAGNOSIS — S0990XA Unspecified injury of head, initial encounter: Secondary | ICD-10-CM | POA: Diagnosis not present

## 2020-10-13 DIAGNOSIS — Z87891 Personal history of nicotine dependence: Secondary | ICD-10-CM | POA: Diagnosis not present

## 2020-10-13 DIAGNOSIS — I4819 Other persistent atrial fibrillation: Secondary | ICD-10-CM | POA: Diagnosis present

## 2020-10-13 DIAGNOSIS — E874 Mixed disorder of acid-base balance: Secondary | ICD-10-CM | POA: Diagnosis not present

## 2020-10-13 DIAGNOSIS — R0602 Shortness of breath: Secondary | ICD-10-CM

## 2020-10-13 DIAGNOSIS — K5730 Diverticulosis of large intestine without perforation or abscess without bleeding: Secondary | ICD-10-CM | POA: Diagnosis not present

## 2020-10-13 DIAGNOSIS — I6782 Cerebral ischemia: Secondary | ICD-10-CM | POA: Diagnosis not present

## 2020-10-13 DIAGNOSIS — I110 Hypertensive heart disease with heart failure: Principal | ICD-10-CM | POA: Diagnosis present

## 2020-10-13 DIAGNOSIS — G9341 Metabolic encephalopathy: Secondary | ICD-10-CM | POA: Diagnosis not present

## 2020-10-13 DIAGNOSIS — Z7901 Long term (current) use of anticoagulants: Secondary | ICD-10-CM

## 2020-10-13 DIAGNOSIS — Z9181 History of falling: Secondary | ICD-10-CM | POA: Diagnosis not present

## 2020-10-13 DIAGNOSIS — I5031 Acute diastolic (congestive) heart failure: Secondary | ICD-10-CM | POA: Diagnosis present

## 2020-10-13 DIAGNOSIS — R6889 Other general symptoms and signs: Secondary | ICD-10-CM | POA: Diagnosis not present

## 2020-10-13 DIAGNOSIS — M79604 Pain in right leg: Secondary | ICD-10-CM | POA: Diagnosis present

## 2020-10-13 DIAGNOSIS — Z23 Encounter for immunization: Secondary | ICD-10-CM

## 2020-10-13 DIAGNOSIS — Z7401 Bed confinement status: Secondary | ICD-10-CM | POA: Diagnosis not present

## 2020-10-13 DIAGNOSIS — I10 Essential (primary) hypertension: Secondary | ICD-10-CM | POA: Diagnosis present

## 2020-10-13 DIAGNOSIS — Z043 Encounter for examination and observation following other accident: Secondary | ICD-10-CM | POA: Diagnosis not present

## 2020-10-13 DIAGNOSIS — R319 Hematuria, unspecified: Secondary | ICD-10-CM | POA: Diagnosis present

## 2020-10-13 DIAGNOSIS — I4891 Unspecified atrial fibrillation: Secondary | ICD-10-CM | POA: Diagnosis present

## 2020-10-13 DIAGNOSIS — Z09 Encounter for follow-up examination after completed treatment for conditions other than malignant neoplasm: Secondary | ICD-10-CM

## 2020-10-13 DIAGNOSIS — Z7189 Other specified counseling: Secondary | ICD-10-CM | POA: Diagnosis not present

## 2020-10-13 DIAGNOSIS — R55 Syncope and collapse: Secondary | ICD-10-CM | POA: Diagnosis not present

## 2020-10-13 DIAGNOSIS — E871 Hypo-osmolality and hyponatremia: Secondary | ICD-10-CM | POA: Diagnosis present

## 2020-10-13 DIAGNOSIS — N390 Urinary tract infection, site not specified: Secondary | ICD-10-CM | POA: Diagnosis not present

## 2020-10-13 DIAGNOSIS — Z85828 Personal history of other malignant neoplasm of skin: Secondary | ICD-10-CM | POA: Diagnosis not present

## 2020-10-13 DIAGNOSIS — E785 Hyperlipidemia, unspecified: Secondary | ICD-10-CM | POA: Diagnosis not present

## 2020-10-13 DIAGNOSIS — M25512 Pain in left shoulder: Secondary | ICD-10-CM | POA: Diagnosis not present

## 2020-10-13 DIAGNOSIS — Z79899 Other long term (current) drug therapy: Secondary | ICD-10-CM

## 2020-10-13 DIAGNOSIS — J9811 Atelectasis: Secondary | ICD-10-CM | POA: Diagnosis not present

## 2020-10-13 DIAGNOSIS — R2689 Other abnormalities of gait and mobility: Secondary | ICD-10-CM | POA: Diagnosis not present

## 2020-10-13 DIAGNOSIS — I5030 Unspecified diastolic (congestive) heart failure: Secondary | ICD-10-CM | POA: Diagnosis present

## 2020-10-13 DIAGNOSIS — M6281 Muscle weakness (generalized): Secondary | ICD-10-CM | POA: Diagnosis not present

## 2020-10-13 DIAGNOSIS — G319 Degenerative disease of nervous system, unspecified: Secondary | ICD-10-CM | POA: Diagnosis not present

## 2020-10-13 DIAGNOSIS — S199XXA Unspecified injury of neck, initial encounter: Secondary | ICD-10-CM | POA: Diagnosis not present

## 2020-10-13 DIAGNOSIS — M533 Sacrococcygeal disorders, not elsewhere classified: Secondary | ICD-10-CM | POA: Diagnosis not present

## 2020-10-13 DIAGNOSIS — M47812 Spondylosis without myelopathy or radiculopathy, cervical region: Secondary | ICD-10-CM | POA: Diagnosis not present

## 2020-10-13 DIAGNOSIS — R4182 Altered mental status, unspecified: Secondary | ICD-10-CM | POA: Diagnosis not present

## 2020-10-13 DIAGNOSIS — M5030 Other cervical disc degeneration, unspecified cervical region: Secondary | ICD-10-CM | POA: Diagnosis not present

## 2020-10-13 DIAGNOSIS — R278 Other lack of coordination: Secondary | ICD-10-CM | POA: Diagnosis not present

## 2020-10-13 DIAGNOSIS — J9601 Acute respiratory failure with hypoxia: Secondary | ICD-10-CM | POA: Diagnosis present

## 2020-10-13 DIAGNOSIS — R0902 Hypoxemia: Secondary | ICD-10-CM | POA: Diagnosis not present

## 2020-10-13 DIAGNOSIS — I482 Chronic atrial fibrillation, unspecified: Secondary | ICD-10-CM | POA: Diagnosis not present

## 2020-10-13 DIAGNOSIS — Z743 Need for continuous supervision: Secondary | ICD-10-CM | POA: Diagnosis not present

## 2020-10-13 DIAGNOSIS — R531 Weakness: Secondary | ICD-10-CM | POA: Diagnosis not present

## 2020-10-13 DIAGNOSIS — I517 Cardiomegaly: Secondary | ICD-10-CM | POA: Diagnosis not present

## 2020-10-13 DIAGNOSIS — R609 Edema, unspecified: Secondary | ICD-10-CM

## 2020-10-13 LAB — URINALYSIS, ROUTINE W REFLEX MICROSCOPIC
Bacteria, UA: NONE SEEN
Bilirubin Urine: NEGATIVE
Glucose, UA: NEGATIVE mg/dL
Ketones, ur: NEGATIVE mg/dL
Leukocytes,Ua: NEGATIVE
Nitrite: NEGATIVE
Protein, ur: 100 mg/dL — AB
RBC / HPF: >50 RBC/hpf — ABNORMAL HIGH (ref 0–5)
Specific Gravity, Urine: 1.016 (ref 1.005–1.030)
pH: 6 (ref 5.0–8.0)

## 2020-10-13 LAB — CBC WITH DIFFERENTIAL/PLATELET
Abs Immature Granulocytes: 0.07 10*3/uL (ref 0.00–0.07)
Basophils Absolute: 0 10*3/uL (ref 0.0–0.1)
Basophils Relative: 0 %
Eosinophils Absolute: 0.1 10*3/uL (ref 0.0–0.5)
Eosinophils Relative: 1 %
HCT: 40.6 % (ref 39.0–52.0)
Hemoglobin: 13.6 g/dL (ref 13.0–17.0)
Immature Granulocytes: 1 %
Lymphocytes Relative: 8 %
Lymphs Abs: 0.7 10*3/uL (ref 0.7–4.0)
MCH: 29.4 pg (ref 26.0–34.0)
MCHC: 33.5 g/dL (ref 30.0–36.0)
MCV: 87.7 fL (ref 80.0–100.0)
Monocytes Absolute: 1 10*3/uL (ref 0.1–1.0)
Monocytes Relative: 11 %
Neutro Abs: 7 10*3/uL (ref 1.7–7.7)
Neutrophils Relative %: 79 %
Platelets: UNDETERMINED 10*3/uL (ref 150–400)
RBC: 4.63 MIL/uL (ref 4.22–5.81)
RDW: 13.8 % (ref 11.5–15.5)
WBC: 8.9 10*3/uL (ref 4.0–10.5)
nRBC: 0 % (ref 0.0–0.2)

## 2020-10-13 LAB — BASIC METABOLIC PANEL
Anion gap: 10 (ref 5–15)
BUN: 11 mg/dL (ref 8–23)
CO2: 29 mmol/L (ref 22–32)
Calcium: 8.9 mg/dL (ref 8.9–10.3)
Chloride: 88 mmol/L — ABNORMAL LOW (ref 98–111)
Creatinine, Ser: 0.93 mg/dL (ref 0.61–1.24)
GFR, Estimated: >60 mL/min (ref >60)
Glucose, Bld: 110 mg/dL — ABNORMAL HIGH (ref 70–99)
Potassium: 3.7 mmol/L (ref 3.5–5.1)
Sodium: 127 mmol/L — ABNORMAL LOW (ref 135–145)

## 2020-10-13 NOTE — ED Provider Notes (Signed)
Emergency Medicine Provider Triage Evaluation Note  [name] , a 87 y.o. male  was evaluated in triage.  Pt complains of a fall earlier today.  He is now with pain on his tailbone.  Also endorsing hematuria.  No fevers, chills, saddle anesthesia, loss of bowel or bladder dysfunction.  Review of Systems  Positive: Pain in tailbone, hematuria Negative: Seizure, syncope, LOC, dysuria  Physical Exam  BP (!) 155/91   Pulse 96   Temp 98.4 F (36.9 C) (Oral)   Resp 18   SpO2 95%  Gen:   Awake, no distress   Resp:  Normal effort  MSK:   Moves extremities without difficulty  Other:    Medical Decision Making  Medically screening exam initiated at 3:59 PM.  Appropriate orders placed.  KURTIS ANASTASIA was informed that the remainder of the evaluation will be completed by another provider, this initial triage assessment does not replace that evaluation, and the importance of remaining in the ED until their evaluation is complete.     Rhae Hammock, PA-C 10/13/20 1603    Lorelle Gibbs, DO 10/15/20 1005

## 2020-10-13 NOTE — ED Triage Notes (Signed)
Pt arrived to triage with family after having a fall this morning. Pt denies hitting his head or having an LOC. States he fell due to losing his balance. Family concerned due to notified some blood in urine and pt having pain in scrotum area and tailbone   Pt states he has been walking since the fall this morning and pain gets worse with movement

## 2020-10-14 ENCOUNTER — Telehealth: Payer: Self-pay | Admitting: Orthopaedic Surgery

## 2020-10-14 ENCOUNTER — Emergency Department (HOSPITAL_COMMUNITY): Payer: Medicare Other

## 2020-10-14 ENCOUNTER — Ambulatory Visit: Payer: Medicare Other | Admitting: Physician Assistant

## 2020-10-14 DIAGNOSIS — M47812 Spondylosis without myelopathy or radiculopathy, cervical region: Secondary | ICD-10-CM | POA: Diagnosis not present

## 2020-10-14 DIAGNOSIS — Z20822 Contact with and (suspected) exposure to covid-19: Secondary | ICD-10-CM | POA: Diagnosis present

## 2020-10-14 DIAGNOSIS — G319 Degenerative disease of nervous system, unspecified: Secondary | ICD-10-CM | POA: Diagnosis not present

## 2020-10-14 DIAGNOSIS — Y92009 Unspecified place in unspecified non-institutional (private) residence as the place of occurrence of the external cause: Secondary | ICD-10-CM | POA: Diagnosis not present

## 2020-10-14 DIAGNOSIS — Z23 Encounter for immunization: Secondary | ICD-10-CM | POA: Diagnosis not present

## 2020-10-14 DIAGNOSIS — Z85828 Personal history of other malignant neoplasm of skin: Secondary | ICD-10-CM | POA: Diagnosis not present

## 2020-10-14 DIAGNOSIS — M79604 Pain in right leg: Secondary | ICD-10-CM | POA: Diagnosis present

## 2020-10-14 DIAGNOSIS — R319 Hematuria, unspecified: Secondary | ICD-10-CM | POA: Diagnosis present

## 2020-10-14 DIAGNOSIS — I4819 Other persistent atrial fibrillation: Secondary | ICD-10-CM | POA: Diagnosis present

## 2020-10-14 DIAGNOSIS — W19XXXA Unspecified fall, initial encounter: Secondary | ICD-10-CM

## 2020-10-14 DIAGNOSIS — R531 Weakness: Secondary | ICD-10-CM | POA: Diagnosis not present

## 2020-10-14 DIAGNOSIS — Z7901 Long term (current) use of anticoagulants: Secondary | ICD-10-CM

## 2020-10-14 DIAGNOSIS — S199XXA Unspecified injury of neck, initial encounter: Secondary | ICD-10-CM | POA: Diagnosis not present

## 2020-10-14 DIAGNOSIS — R0902 Hypoxemia: Secondary | ICD-10-CM | POA: Diagnosis not present

## 2020-10-14 DIAGNOSIS — Z7401 Bed confinement status: Secondary | ICD-10-CM | POA: Diagnosis not present

## 2020-10-14 DIAGNOSIS — J9811 Atelectasis: Secondary | ICD-10-CM | POA: Diagnosis not present

## 2020-10-14 DIAGNOSIS — Z9049 Acquired absence of other specified parts of digestive tract: Secondary | ICD-10-CM | POA: Diagnosis not present

## 2020-10-14 DIAGNOSIS — Z515 Encounter for palliative care: Secondary | ICD-10-CM | POA: Diagnosis not present

## 2020-10-14 DIAGNOSIS — Z79899 Other long term (current) drug therapy: Secondary | ICD-10-CM | POA: Diagnosis not present

## 2020-10-14 DIAGNOSIS — E871 Hypo-osmolality and hyponatremia: Secondary | ICD-10-CM | POA: Diagnosis present

## 2020-10-14 DIAGNOSIS — R6889 Other general symptoms and signs: Secondary | ICD-10-CM | POA: Diagnosis not present

## 2020-10-14 DIAGNOSIS — I4891 Unspecified atrial fibrillation: Secondary | ICD-10-CM | POA: Diagnosis not present

## 2020-10-14 DIAGNOSIS — Z8673 Personal history of transient ischemic attack (TIA), and cerebral infarction without residual deficits: Secondary | ICD-10-CM | POA: Diagnosis not present

## 2020-10-14 DIAGNOSIS — I6782 Cerebral ischemia: Secondary | ICD-10-CM | POA: Diagnosis not present

## 2020-10-14 DIAGNOSIS — S0990XA Unspecified injury of head, initial encounter: Secondary | ICD-10-CM | POA: Diagnosis not present

## 2020-10-14 DIAGNOSIS — I872 Venous insufficiency (chronic) (peripheral): Secondary | ICD-10-CM | POA: Diagnosis present

## 2020-10-14 DIAGNOSIS — F0392 Unspecified dementia, unspecified severity, with psychotic disturbance: Secondary | ICD-10-CM | POA: Diagnosis present

## 2020-10-14 DIAGNOSIS — Z043 Encounter for examination and observation following other accident: Secondary | ICD-10-CM | POA: Diagnosis not present

## 2020-10-14 DIAGNOSIS — Z7189 Other specified counseling: Secondary | ICD-10-CM | POA: Diagnosis not present

## 2020-10-14 DIAGNOSIS — M5030 Other cervical disc degeneration, unspecified cervical region: Secondary | ICD-10-CM | POA: Diagnosis present

## 2020-10-14 DIAGNOSIS — J9601 Acute respiratory failure with hypoxia: Secondary | ICD-10-CM | POA: Diagnosis present

## 2020-10-14 DIAGNOSIS — E874 Mixed disorder of acid-base balance: Secondary | ICD-10-CM | POA: Diagnosis present

## 2020-10-14 DIAGNOSIS — E785 Hyperlipidemia, unspecified: Secondary | ICD-10-CM | POA: Diagnosis present

## 2020-10-14 DIAGNOSIS — I5031 Acute diastolic (congestive) heart failure: Secondary | ICD-10-CM | POA: Diagnosis not present

## 2020-10-14 DIAGNOSIS — I10 Essential (primary) hypertension: Secondary | ICD-10-CM | POA: Diagnosis not present

## 2020-10-14 DIAGNOSIS — I110 Hypertensive heart disease with heart failure: Secondary | ICD-10-CM | POA: Diagnosis present

## 2020-10-14 DIAGNOSIS — N390 Urinary tract infection, site not specified: Secondary | ICD-10-CM

## 2020-10-14 DIAGNOSIS — I517 Cardiomegaly: Secondary | ICD-10-CM | POA: Diagnosis not present

## 2020-10-14 DIAGNOSIS — R4182 Altered mental status, unspecified: Secondary | ICD-10-CM | POA: Diagnosis not present

## 2020-10-14 DIAGNOSIS — R17 Unspecified jaundice: Secondary | ICD-10-CM | POA: Diagnosis not present

## 2020-10-14 DIAGNOSIS — Z743 Need for continuous supervision: Secondary | ICD-10-CM | POA: Diagnosis not present

## 2020-10-14 DIAGNOSIS — G9341 Metabolic encephalopathy: Secondary | ICD-10-CM | POA: Diagnosis present

## 2020-10-14 DIAGNOSIS — K5730 Diverticulosis of large intestine without perforation or abscess without bleeding: Secondary | ICD-10-CM | POA: Diagnosis not present

## 2020-10-14 DIAGNOSIS — I5030 Unspecified diastolic (congestive) heart failure: Secondary | ICD-10-CM | POA: Diagnosis present

## 2020-10-14 DIAGNOSIS — R0602 Shortness of breath: Secondary | ICD-10-CM | POA: Diagnosis present

## 2020-10-14 DIAGNOSIS — I5043 Acute on chronic combined systolic (congestive) and diastolic (congestive) heart failure: Secondary | ICD-10-CM | POA: Diagnosis not present

## 2020-10-14 DIAGNOSIS — Z87891 Personal history of nicotine dependence: Secondary | ICD-10-CM | POA: Diagnosis not present

## 2020-10-14 LAB — I-STAT VENOUS BLOOD GAS, ED
Acid-Base Excess: 7 mmol/L — ABNORMAL HIGH (ref 0.0–2.0)
Bicarbonate: 33.5 mmol/L — ABNORMAL HIGH (ref 20.0–28.0)
Calcium, Ion: 1.1 mmol/L — ABNORMAL LOW (ref 1.15–1.40)
HCT: 46 % (ref 39.0–52.0)
Hemoglobin: 15.6 g/dL (ref 13.0–17.0)
O2 Saturation: 51 %
Potassium: 4 mmol/L (ref 3.5–5.1)
Sodium: 130 mmol/L — ABNORMAL LOW (ref 135–145)
TCO2: 35 mmol/L — ABNORMAL HIGH (ref 22–32)
pCO2, Ven: 54.2 mmHg (ref 44.0–60.0)
pH, Ven: 7.399 (ref 7.250–7.430)
pO2, Ven: 28 mmHg — CL (ref 32.0–45.0)

## 2020-10-14 LAB — RESP PANEL BY RT-PCR (FLU A&B, COVID) ARPGX2
Influenza A by PCR: NEGATIVE
Influenza B by PCR: NEGATIVE
SARS Coronavirus 2 by RT PCR: NEGATIVE

## 2020-10-14 LAB — C-REACTIVE PROTEIN: CRP: 8.8 mg/dL — ABNORMAL HIGH (ref <1.0)

## 2020-10-14 LAB — HEPATIC FUNCTION PANEL
ALT: 27 U/L (ref 0–44)
AST: 43 U/L — ABNORMAL HIGH (ref 15–41)
Albumin: 3.5 g/dL (ref 3.5–5.0)
Alkaline Phosphatase: 54 U/L (ref 38–126)
Bilirubin, Direct: 0.4 mg/dL — ABNORMAL HIGH (ref 0.0–0.2)
Indirect Bilirubin: 1.1 mg/dL — ABNORMAL HIGH (ref 0.3–0.9)
Total Bilirubin: 1.5 mg/dL — ABNORMAL HIGH (ref 0.3–1.2)
Total Protein: <3 g/dL — ABNORMAL LOW (ref 6.5–8.1)

## 2020-10-14 LAB — SODIUM, URINE, RANDOM: Sodium, Ur: 89 mmol/L

## 2020-10-14 LAB — PROTIME-INR
INR: 2.5 — ABNORMAL HIGH (ref 0.8–1.2)
Prothrombin Time: 26.7 seconds — ABNORMAL HIGH (ref 11.4–15.2)

## 2020-10-14 LAB — PROCALCITONIN: Procalcitonin: <0.1 ng/mL

## 2020-10-14 LAB — BRAIN NATRIURETIC PEPTIDE: B Natriuretic Peptide: 141.1 pg/mL — ABNORMAL HIGH (ref 0.0–100.0)

## 2020-10-14 LAB — CK: Total CK: 413 U/L — ABNORMAL HIGH (ref 49–397)

## 2020-10-14 LAB — TSH: TSH: 0.991 u[IU]/mL (ref 0.350–4.500)

## 2020-10-14 LAB — AMMONIA: Ammonia: 23 umol/L (ref 9–35)

## 2020-10-14 LAB — OSMOLALITY, URINE: Osmolality, Ur: 328 mOsm/kg (ref 300–900)

## 2020-10-14 MED ORDER — ACETAMINOPHEN 650 MG RE SUPP: 650.0000 mg | Freq: Four times a day (QID) | RECTAL | Status: DC | PRN

## 2020-10-14 MED ORDER — ACETAMINOPHEN 325 MG PO TABS
650.0000 mg | ORAL_TABLET | Freq: Once | ORAL | Status: AC
  Administered 2020-10-14: 650 mg via ORAL
  Filled 2020-10-14: qty 2

## 2020-10-14 MED ORDER — WARFARIN SODIUM 5 MG PO TABS
5.0000 mg | ORAL_TABLET | Freq: Once | ORAL | Status: AC
  Administered 2020-10-14: 5 mg via ORAL
  Filled 2020-10-14 (×2): qty 1

## 2020-10-14 MED ORDER — METOPROLOL SUCCINATE ER 50 MG PO TB24
75.0000 mg | ORAL_TABLET | Freq: Every day | ORAL | Status: DC
  Administered 2020-10-14 – 2020-10-15 (×2): 75 mg via ORAL
  Filled 2020-10-14: qty 1
  Filled 2020-10-14: qty 3

## 2020-10-14 MED ORDER — SODIUM CHLORIDE 0.9% FLUSH
3.0000 mL | Freq: Two times a day (BID) | INTRAVENOUS | Status: DC
  Administered 2020-10-14 – 2020-10-22 (×16): 3 mL via INTRAVENOUS

## 2020-10-14 MED ORDER — FUROSEMIDE 10 MG/ML IJ SOLN: 40.0000 mg | Freq: Once | INTRAMUSCULAR | Status: DC

## 2020-10-14 MED ORDER — WARFARIN - PHARMACIST DOSING INPATIENT: Freq: Every day | Status: DC

## 2020-10-14 MED ORDER — FUROSEMIDE 10 MG/ML IJ SOLN
40.0000 mg | Freq: Two times a day (BID) | INTRAMUSCULAR | Status: DC
  Administered 2020-10-14 – 2020-10-15 (×4): 40 mg via INTRAVENOUS
  Filled 2020-10-14 (×4): qty 4

## 2020-10-14 MED ORDER — LISINOPRIL 10 MG PO TABS
10.0000 mg | ORAL_TABLET | Freq: Every day | ORAL | Status: DC
  Administered 2020-10-14 – 2020-10-17 (×4): 10 mg via ORAL
  Filled 2020-10-14 (×4): qty 1

## 2020-10-14 MED ORDER — ALBUTEROL SULFATE (2.5 MG/3ML) 0.083% IN NEBU
2.5000 mg | INHALATION_SOLUTION | Freq: Four times a day (QID) | RESPIRATORY_TRACT | Status: DC | PRN
  Administered 2020-10-16: 2.5 mg via RESPIRATORY_TRACT
  Filled 2020-10-14: qty 3

## 2020-10-14 MED ORDER — ACETAMINOPHEN 325 MG PO TABS
650.0000 mg | ORAL_TABLET | Freq: Four times a day (QID) | ORAL | Status: DC | PRN
  Administered 2020-10-15: 650 mg via ORAL
  Filled 2020-10-14: qty 2

## 2020-10-14 MED ORDER — DICLOFENAC SODIUM 1 % EX GEL
2.0000 g | Freq: Two times a day (BID) | CUTANEOUS | Status: DC | PRN
  Administered 2020-10-15 – 2020-10-16 (×2): 2 g via TOPICAL
  Filled 2020-10-14: qty 100

## 2020-10-14 NOTE — Progress Notes (Signed)
[place] for warfarin Indication: atrial fibrillation   Assessment: 71 yom on warfarin PTA for afib presenting s/p fall. Imaging negative for bleed. Pharmacy consulted to dose warfarin inpatient. INR therapeutic at 2.5 on admit. Hg wnl, plt clumped. No bleed issues reported.  PTA warfarin dose: 5mg  daily except 7.5mg  on Mon (last dose 10/4 PTA)  Goal of Therapy:  INR 2-3 Monitor platelets by anticoagulation protocol: Yes   Plan:  Warfarin 5mg  PO x 1 dose Daily INR Monitor CBC, s/sx bleeding  Elicia Lamp, PharmD, BCPS Clinical Pharmacist 10/14/2020 12:22 PM

## 2020-10-14 NOTE — ED Notes (Signed)
Son concern he notice dad starting to have trimers once he was falling asleep. Pt placed in recliner for more comfort. Will continue to monitor.

## 2020-10-14 NOTE — ED Provider Notes (Signed)
[facility] EMERGENCY DEPARTMENT Provider Note   CSN: [id number] Arrival date & time: 10/13/20  1424     History Chief Complaint  Patient presents with   [facility] [name] is a 87 y.o. male.  87 y.o male with a PMH of Afib, Embolic stroke, presents to the ED with a chief complaint of unwitnessed fall x yesterday. According to son at the bedside, patient was at home, when he likely fell backwards. He picked patient up from the floor, bathed him however he noted patient to be not at his baseline since the incident. He states patient has not been ambulatory since the incident. He was given tylenol by triage without much improvement in symptoms. In addition, patient's son does report some "seizure-like activity ", when patient was falling asleep in the waiting room.  Denies any abdominal pain, chest pain, shortness of breath, headache.  Of note, patient is anticoagulated on Coumadin.  The history is provided by the patient and a caregiver.  Fall This is a new problem. Pertinent negatives include no chest pain, no abdominal pain, no headaches and no shortness of breath.      Past Medical History:  Diagnosis Date   Atrial fibrillation ([place]) 10/15/2012   Cancer ([place])    Skin (R hand)   Embolic stroke ([place]) 24/58/0998   Essential hypertension 01/21/2014   Long term current use of anticoagulant therapy 12/25/2012    Patient Active Problem List   Diagnosis Date Noted   Hyponatremia 10/14/2020   Subdural hematoma 10/22/2015   Thrombocytopenia ([place]) 10/22/2015   Chronic venous stasis dermatitis 10/22/2015   Fall    Head injury    Essential hypertension 33/82/5053   Embolic stroke ([place]) 97/67/3419   Long term current use of anticoagulant therapy 12/25/2012   Atrial fibrillation ([place]) 10/15/2012   Acute, but ill-defined, cerebrovascular disease 10/15/2012    Past Surgical History:  Procedure Laterality Date   APPENDECTOMY     CARDIAC CATHETERIZATION      CYSTOSCOPY WITH INSERTION OF UROLIFT N/A 10/10/2017   Procedure: CYSTOSCOPY WITH INSERTION OF UROLIFT;  Surgeon: [name], MD;  Location: [facility]S;  Service: Urology;  Laterality: N/A;  NEEDS 60 MIN   EYE SURGERY Bilateral 2013   Cataracts       Family History  Problem Relation Age of Onset   Other Mother        Brain hemmorrhage   Other Father        unknown    Social History   Tobacco Use   Smoking status: Former    Packs/day: 0.25    Years: 10.00    Pack years: 2.50    Types: Cigarettes   Smokeless tobacco: Never  Vaping Use   Vaping Use: Never used  Substance Use Topics   Alcohol use: No    Alcohol/week: 0.0 standard drinks   Drug use: No    Home Medications Prior to Admission medications   Medication Sig Start Date End Date Taking? Authorizing Provider  acetaminophen (TYLENOL) 500 MG tablet Take 500-1,000 mg by mouth every 6 (six) hours as needed (FOR PAIN.).   Yes [provider]  albuterol (VENTOLIN HFA) 108 (90 Base) MCG/ACT inhaler Inhale 1 puff into the lungs every 4 (four) hours as needed for wheezing or shortness of breath. 07/02/20  Yes [provider]  aspirin EC 81 MG tablet Take 81 mg by mouth at bedtime.   Yes [provider]  calcium carbonate (OS-CAL)  600 MG TABS tablet Take 600 mg by mouth daily.    Yes [provider]  Glucosamine HCl (GLUCOSAMINE PO) Take 1,500 mg by mouth daily.    Yes [provider]  hydrochlorothiazide (HYDRODIURIL) 12.5 MG tablet Take 1 tablet (12.5 mg total) by mouth daily. 07/03/17  Yes Belva Crome, MD  lisinopril (PRINIVIL,ZESTRIL) 10 MG tablet Take 10 mg by mouth daily.   Yes [provider]  metoprolol succinate (TOPROL-XL) 25 MG 24 hr tablet TAKE 1 TABLET BY MOUTH DAILY WITH 50 MG TABLET FOR TOTAL DAILY DOSE OF 75 MG. TAKE WITH OR IMMEDIATELY FOLLOWING A MEAL. Please make overdue appt for future refills Thank you Patient taking differently: Take 25 mg by mouth  daily. Along with 50 mg tab 09/28/20  Yes Belva Crome, MD  metoprolol succinate (TOPROL-XL) 50 MG 24 hr tablet Take 1 tablet (50 mg total) by mouth daily. Along with a 25mg  tablet for a total of 75mg . Take with or immediately following a meal. Patient taking differently: Take 50 mg by mouth daily. Along with 25 mg tab 01/24/19  Yes Belva Crome, MD  Multiple Vitamin (MULTIVITAMIN) tablet Take 1 tablet by mouth daily.   Yes [provider]  Multiple Vitamins-Minerals (PRESERVISION AREDS 2) CAPS Take 1 capsule by mouth 2 (two) times daily.   Yes [provider]  traMADol (ULTRAM) 50 MG tablet Take 1 tablet (50 mg total) by mouth every 6 (six) hours as needed. Patient taking differently: Take 50 mg by mouth every 6 (six) hours as needed for moderate pain. 08/31/20  Yes Fisher, Linden Dolin, PA-C  warfarin (COUMADIN) 5 MG tablet TAKE AS DIRECTED PER ANTICOAGULATION CLINIC Patient taking differently: Take 5-7.5 mg by mouth See admin instructions. 7.5 mg on Sunday 5 mg Monday,Tuesday,Wednesday,Thursday,Friday,saturday 09/15/20  Yes Bhagat, Bhavinkumar, PA  lidocaine (LIDODERM) 5 % Place 1 patch onto the skin every 12 (twelve) hours. Remove & Discard patch within 12 hours or as directed by MD Patient not taking: No sig reported 08/31/20 08/31/21  Versie Starks, PA-C    Allergies    Patient has no known allergies.  Review of Systems   Review of Systems  Constitutional:  Negative for chills and fever.  HENT:  Negative for sore throat.   Respiratory:  Negative for shortness of breath.   Cardiovascular:  Negative for chest pain.  Gastrointestinal:  Negative for abdominal pain, nausea and vomiting.  Genitourinary:  Negative for flank pain.  Musculoskeletal:  Positive for back pain and myalgias.  Skin:  Negative for pallor and wound.  Neurological:  Negative for syncope, light-headedness and headaches.  All other systems reviewed and are negative.  Physical Exam Updated Vital Signs BP  (!) 165/92   Pulse 88   Temp 98 F (36.7 C) (Oral)   Resp (!) 21   SpO2 96%   Physical Exam Vitals and nursing note reviewed.  Constitutional:      Appearance: Normal appearance. He is not ill-appearing.  HENT:     Head: Normocephalic.     Nose: Nose normal.     Mouth/Throat:     Mouth: Mucous membranes are dry.  Eyes:     Comments: Pupils are pinpoint, constricted.  Cardiovascular:     Rate and Rhythm: Normal rate.     Comments: No calf tenderness, 2 + BL pitting edema.  Pulmonary:     Effort: Pulmonary effort is normal.     Breath sounds: No wheezing.  Abdominal:  General: Abdomen is flat.  Musculoskeletal:     Cervical back: Normal range of motion and neck supple.     Right lower leg: 2+ Edema present.     Left lower leg: 2+ Edema present.     Comments: Chronic venous statis to BLLE  Skin:    General: Skin is warm and dry.  Neurological:     Mental Status: He is disoriented.    ED Results / Procedures / Treatments   Labs (all labs ordered are listed, but only abnormal results are displayed) Labs Reviewed  URINALYSIS, ROUTINE W REFLEX MICROSCOPIC - Abnormal; Notable for the following components:      Result Value   Hgb urine dipstick SMALL (*)    Protein, ur 100 (*)    RBC / HPF >50 (*)    All other components within normal limits  BASIC METABOLIC PANEL - Abnormal; Notable for the following components:   Sodium 127 (*)    Chloride 88 (*)    Glucose, Bld 110 (*)    All other components within normal limits  PROTIME-INR - Abnormal; Notable for the following components:   Prothrombin Time 26.7 (*)    INR 2.5 (*)    All other components within normal limits  HEPATIC FUNCTION PANEL - Abnormal; Notable for the following components:   Total Protein <3.0 (*)    AST 43 (*)    Total Bilirubin 1.5 (*)    Bilirubin, Direct 0.4 (*)    Indirect Bilirubin 1.1 (*)    All other components within normal limits  BRAIN NATRIURETIC PEPTIDE - Abnormal; Notable for the  following components:   B Natriuretic Peptide 141.1 (*)    All other components within normal limits  RESP PANEL BY RT-PCR (FLU A&B, COVID) [id number]  CBC WITH DIFFERENTIAL/PLATELET  CK    EKG EKG Interpretation  Date/Time:  Wednesday October 14 2020 10:40:28 EDT Ventricular Rate:  96 PR Interval:    QRS Duration: 93 QT Interval:  383 QTC Calculation: 484 R Axis:   92 Text Interpretation: Atrial fibrillation Right axis deviation Probable anteroseptal infarct, old Minimal ST depression, inferior leads No significant change since prior 10/17 Confirmed by Aletta Edouard [phone] on 10/14/2020 10:43:35 AM  Radiology DG Chest 2 View  Result Date: 10/14/2020 CLINICAL DATA:  Syncope ,feeling weak,fell down , some sobfall/syncope EXAM: CHEST - 2 VIEW COMPARISON:  Chest x-ray 2011, shoulder radiograph 08/31/2020 FINDINGS: Stable cardiac silhouette. There is linear interstitial thickening in the lungs. Chronic elevation RIGHT hemidiaphragm. No focal consolidation. No pneumothorax. Irregular margin of the LEFT humeral head. IMPRESSION: 1. Interstitial edema pattern. 2. Low lung volumes. 3. LEFT humeral head fracture versus severe arthropathy. Consider dedicated view of the LEFT shoulder Electronically Signed   By: Suzy Bouchard M.D.   On: 10/14/2020 08:40   DG Pelvis 1-2 Views  Result Date: 10/13/2020 CLINICAL DATA:  Tailbone pain EXAM: PELVIS - 1-2 VIEW COMPARISON:  06/09/2015 FINDINGS: SI joints are non widened. The pubic symphysis and rami are intact. Right hip replacement with intact hardware and normal alignment. Vascular calcifications. There are mild degenerative changes of the left hip. IMPRESSION: Right hip replacement without acute osseous abnormality. Electronically Signed   By: Donavan Foil M.D.   On: 10/13/2020 16:51   CT HEAD WO CONTRAST (5MM)  Result Date: 10/14/2020 CLINICAL DATA:  87 year old male with history of trauma from a fall at home yesterday. EXAM: CT HEAD WITHOUT  CONTRAST CT CERVICAL SPINE WITHOUT CONTRAST TECHNIQUE: Multidetector CT imaging of  the head and cervical spine was performed following the standard protocol without intravenous contrast. Multiplanar CT image reconstructions of the cervical spine were also generated. COMPARISON:  Head CT 11/13/2015.  Cervical spine CT 10/21/2015. FINDINGS: CT HEAD FINDINGS Brain: Moderate cerebral atrophy with ex vacuo dilatation of the ventricular system. Patchy and confluent areas of decreased attenuation are noted throughout the deep and periventricular white matter of the cerebral hemispheres bilaterally, compatible with chronic microvascular ischemic disease. Well-defined areas of low attenuation in the left occipital lobe posteromedially, similar to the prior study, compatible with an area of encephalomalacia from remote left PCA territory infarct. Cavum septum pellucidum (normal anatomical variant). No evidence of acute infarction, hemorrhage, hydrocephalus, extra-axial collection or mass lesion/mass effect. Vascular: No hyperdense vessel or unexpected calcification. Skull: Normal. Negative for fracture or focal lesion. Sinuses/Orbits: No acute finding. Other: None. CT CERVICAL SPINE FINDINGS Alignment: Normal. Skull base and vertebrae: No acute fracture. No primary bone lesion or focal pathologic process. Soft tissues and spinal canal: No prevertebral fluid or swelling. No visible canal hematoma. Disc levels: Severe multilevel degenerative disc disease, most pronounced at C4-C5, C5-C6 and. Severe multilevel facet arthropathy bilaterally. Upper chest: Areas of ground-glass attenuation and interlobular septal thickening in the visualize lungs, concerning for pulmonary edema. Other: None. IMPRESSION: 1. No evidence of significant acute traumatic injury to the skull, brain or cervical spine. 2. Moderate cerebral atrophy with extensive chronic microvascular ischemic changes and old left PCA territory infarct. 3. The appearance of  the lung apices suggests pulmonary edema. Correlation with chest radiography is recommended. 4. Multilevel degenerative disc disease and cervical spondylosis, as above. Electronically Signed   By: Vinnie Langton M.D.   On: 10/14/2020 08:33   CT Cervical Spine Wo Contrast  Result Date: 10/14/2020 CLINICAL DATA:  87 year old male with history of trauma from a fall at home yesterday. EXAM: CT HEAD WITHOUT CONTRAST CT CERVICAL SPINE WITHOUT CONTRAST TECHNIQUE: Multidetector CT imaging of the head and cervical spine was performed following the standard protocol without intravenous contrast. Multiplanar CT image reconstructions of the cervical spine were also generated. COMPARISON:  Head CT 11/13/2015.  Cervical spine CT 10/21/2015. FINDINGS: CT HEAD FINDINGS Brain: Moderate cerebral atrophy with ex vacuo dilatation of the ventricular system. Patchy and confluent areas of decreased attenuation are noted throughout the deep and periventricular white matter of the cerebral hemispheres bilaterally, compatible with chronic microvascular ischemic disease. Well-defined areas of low attenuation in the left occipital lobe posteromedially, similar to the prior study, compatible with an area of encephalomalacia from remote left PCA territory infarct. Cavum septum pellucidum (normal anatomical variant). No evidence of acute infarction, hemorrhage, hydrocephalus, extra-axial collection or mass lesion/mass effect. Vascular: No hyperdense vessel or unexpected calcification. Skull: Normal. Negative for fracture or focal lesion. Sinuses/Orbits: No acute finding. Other: None. CT CERVICAL SPINE FINDINGS Alignment: Normal. Skull base and vertebrae: No acute fracture. No primary bone lesion or focal pathologic process. Soft tissues and spinal canal: No prevertebral fluid or swelling. No visible canal hematoma. Disc levels: Severe multilevel degenerative disc disease, most pronounced at C4-C5, C5-C6 and. Severe multilevel facet  arthropathy bilaterally. Upper chest: Areas of ground-glass attenuation and interlobular septal thickening in the visualize lungs, concerning for pulmonary edema. Other: None. IMPRESSION: 1. No evidence of significant acute traumatic injury to the skull, brain or cervical spine. 2. Moderate cerebral atrophy with extensive chronic microvascular ischemic changes and old left PCA territory infarct. 3. The appearance of the lung apices suggests pulmonary edema. Correlation with chest radiography is  recommended. 4. Multilevel degenerative disc disease and cervical spondylosis, as above. Electronically Signed   By: Vinnie Langton M.D.   On: 10/14/2020 08:33   CT Lumbar Spine Wo Contrast  Result Date: 10/14/2020 CLINICAL DATA:  87 year old male status post fall at home yesterday. EXAM: CT LUMBAR SPINE WITHOUT CONTRAST TECHNIQUE: Multidetector CT imaging of the lumbar spine was performed without intravenous contrast administration. Multiplanar CT image reconstructions were also generated. COMPARISON:  [facility] CT Abdomen and Pelvis 01/06/2012 FINDINGS: Segmentation: Normal. Alignment: Stable lumbar lordosis since 2013. No significant spondylolisthesis. Vertebrae: Osteopenia. Chronic degenerative S1 superior endplate sclerosis appears stable. Stable lumbar and lower thoracic vertebral height and alignment. Visible sacrum and SI joints appear intact. Visible posterior lower ribs appear intact. No acute osseous abnormality identified. Paraspinal and other soft tissues: Aortoiliac calcified atherosclerosis. Normal caliber abdominal aorta. Stable visible abdominal viscera. Motion artifact at the left costophrenic angle. Lumbar paraspinal soft tissues are normal except for L2-L3 on the left which is detailed below. Disc levels: Widespread lumbar spine degeneration. Vacuum disc L2-L3 through L5-S1. Chronic appearing degenerative and multifactorial spinal stenosis does not appear significantly changed  since 2013, severe at L4-L5. Superimposed chronic oval soft tissue mass occupying the left L2 neural foramen which is smoothly expanded. This mass is best demonstrated on series 1, image 58 and measures 15 x 28 x 13 mm (AP by transverse by CC). It was present in 2013, and is not significantly changed in size or configuration since that time (series 3, image 40 of that exam). IMPRESSION: 1.  No acute traumatic injury identified in the lumbar spine. 2. 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 This has not significantly changed since 2013. 3. Chronic lumbar spine degeneration. Severe chronic multifactorial spinal stenosis at L4-L5. 4.  Aortic Atherosclerosis (ICD10-I70.0). Electronically Signed   By: Genevie Ann M.D.   On: 10/14/2020 08:45   CT PELVIS WO CONTRAST  Result Date: 10/14/2020 CLINICAL DATA:  87 year old male status post fall yesterday. EXAM: CT PELVIS WITHOUT CONTRAST TECHNIQUE: Multidetector CT imaging of the pelvis was performed following the standard protocol without intravenous contrast. COMPARISON:  Lumbar spine CT today. [facility] CT Abdomen and Pelvis 01/06/2012. FINDINGS: Urinary Tract: Kidneys not included. Chronic postoperative changes to the prostate and floor of the bladder. Streak artifact from chronic right hip arthroplasty. The bladder appears stable since 2013. No hydroureter. Bowel: Nondilated visible large and small bowel. Diverticulosis of the sigmoid colon with no active inflammation identified. No free air or free fluid. Vascular/Lymphatic: Aortoiliac calcified atherosclerosis. No lymphadenopathy. Reproductive: Chronic postoperative changes to the prostate, otherwise negative. Other:  Chronic pelvic phleboliths. Musculoskeletal: Sacrum, SI joints, pelvis, left hip, proximal left femur, and pubic rami appear stable since 2013 and intact. Chronic right total hip arthroplasty with regional streak  artifact and heterotopic ossification, not significantly changed since 2013. The entire right femoral component is not included on the CT images but is visible on the scout view. No acute osseous abnormality identified. However, there is some evidence of superficial subcutaneous hematoma or contusion overlying the posterolateral right hip on series 5, image 31, new compared to 2013. But no other superficial soft tissue injury is identified. IMPRESSION: 1. Superficial soft tissue contusion or hematoma overlying the posterolateral right hip. Underlying right hip arthroplasty appears intact. 2. No other acute traumatic injury identified. Electronically Signed   By: Genevie Ann M.D.   On: 10/14/2020 09:04   DG Shoulder  Left  Result Date: 10/14/2020 CLINICAL DATA:  Fall, pain in left shoulder EXAM: LEFT SHOULDER - 2+ VIEW COMPARISON:  Shoulder radiographs 08/31/2020 FINDINGS: No acute fracture or dislocation is seen. Advanced degenerative changes of the glenohumeral joint are again seen. The soft tissues are unremarkable. IMPRESSION: No acute fracture or dislocation identified. Electronically Signed   By: Valetta Mole M.D.   On: 10/14/2020 09:59    Procedures Procedures   Medications Ordered in ED Medications  acetaminophen (TYLENOL) tablet 650 mg (650 mg Oral Given 10/14/20 0353)    ED Course  I have reviewed the triage vital signs and the nursing notes.  Pertinent labs & imaging results that were available during my care of the patient were reviewed by me and considered in my medical decision making (see chart for details).  Clinical Course as of 10/14/20 1156  Wed Oct 14, 2020  0720 Hgb urine dipstick(!): SMALL [JS]  0720 Sodium(!): 127 [JS]  0914 Prothrombin Time(!): 26.7 [JS]  0914 INR(!): 2.5 [JS]  6429 87 year old male here after unwitnessed fall at home.  Complaining of left shoulder low back and pelvic pain.  Imaging showing left humeral head fracture vs degenerative changes.   Hyponatremic.   Increased work of breathing.  May need admission. [MB]    Clinical Course User Index [JS] Janeece Fitting, PA-C [MB] Hayden Rasmussen, MD   MDM Rules/Calculators/A&P  Patient presents to the ED status post unwitnessed fall by his wife.  He is brought in by son at the bedside, who was concerned after patient did not return back to his baseline since the fall.  He continues to complain of low back pain.  He reports he did not strike his head, however patient is currently anticoagulated on Coumadin for a history of A. fib.  She does endorse pain along the lower lumbar spine along with his buttocks.  Not given anything for pain prior to arrival, provided with Tylenol in the ED.  Derm evaluation patient is evaluated approximately 16 hours after his arrival in the emergency department, he is overall very sleepy, does answer questions minimally, however exam with lungs clear to auscultation, no pain with palpation of the abdomen, some small abrasions noted to the superficial aspect of the skin but without any active laceration.  No pain along his head, neck's.  Pupils are pinpoint and constricted, some concern for head injury, will add CT head at this time to further evaluate and rule out any intracranial pathology along with cervical spine CT. Does have chronic venous stasis, 2+ pitting edema bilaterally with a wound that appears in the healing process to the right lower leg.  Moves all upper and lower extremities but very slowly.  Vitals are within normal limits without any signs of tachycardia or have a hypoxia.  Labs obtained during the waiting room are remarkable for hyponatremia with a sodium of 127, creatinine level is within normal limits.  CBC with no leukocytosis, hemoglobin is unremarkable.  PT and INR have been ordered.  UA without any nitrites or leukocytes but some small hemoglobin.  Son does voice concern for blood along patient's briefs after this fall.  Also obtain CT pelvis in L-spine to  further evaluate.  History obtained while in the waiting room of his pelvis without any acute finding.  Xray of the left chest:  3. LEFT humeral head fracture versus severe arthropathy. Consider  dedicated view of the LEFT shoulder   CT Head/ Cervical Spine:  IMPRESSION:  1. No evidence  of significant acute traumatic injury to the skull,  brain or cervical spine.  2. Moderate cerebral atrophy with extensive chronic microvascular  ischemic changes and old left PCA territory infarct.  3. The appearance of the lung apices suggests pulmonary edema.  Correlation with chest radiography is recommended.  4. Multilevel degenerative disc disease and cervical spondylosis, as  above.      9:15 AM spoke to son at the bedside, who reports patient ambulates with a walker at baseline, however due to his new humeral head fracture may need intervention prior to disposition.  BNP is slightly elevated but normal. He has been requiring oxygen here while in the ED. I do feel patient will need to be brought in for further management with a low sodium, hypoxia and likely exacerbation of his CHF. Call placed to [facility] for admission.  Spoke to Dr. Tamala Julian who will admit patient for further management. Patient stable for admission.     Portions of this note were generated with [employer]. Dictation errors may occur despite best attempts at proofreading.  Final Clinical Impression(s) / ED Diagnoses Final diagnoses:  Fall, initial encounter  Hyponatremia  Shortness of breath    Rx / DC Orders ED Discharge Orders     None        Janeece Fitting, PA-C 10/14/20 1156    Hayden Rasmussen, MD 10/14/20 [id number]

## 2020-10-14 NOTE — H&P (Addendum)
History and Physical    [name] RXY:[id number] DOB: September 03, 1930 DOA: 10/13/2020  Referring MD/NP/PA: Bennie Dallas, PA-C PCP: Gaynelle Arabian, MD  Patient coming from: home  Chief Complaint: Fall  I have personally briefly reviewed patient's old medical records in [place]   HPI: [name] is a 87 y.o. male with medical history significant of atrial fibrillation on chronic anticoagulation, embolic CVA, and venous stasis ulcers who presents after having an unwitnessed fall yesterday morning at around 4 AM.  Patient is unable to give history at this time as he is altered and obtained from his son.  Patient was found on the floor and thought that he likely fell backwards.  His son was able to help pick him up, but the patient was complaining of left shoulder and right leg pain.  His son notes that at some point they found some blood present in his stool when he had a bowel movement, but when they wiped him they did not notice any further blood.  Upon further questioning it appears the patient is 4 days and has been more weak.  At baseline patient poor recent memory, but was more confused than normal and not oriented to place or time.  Reported that it seemed like he was hallucinating complaining of the floor moving and saying things like "want to play this game" that did not make sense.  Associated symptoms include swelling of his leg and he has been followed by wound care for venous stasis ulcers.  While in the emergency department patient fell asleep twice and had shaking of his arms that lasted briefly.  No reports of tongue biting, incontinence of urine, or fevers.  He is not on oxygen at baseline and he quit smoking several years ago.  ED Course: Upon admission into the emergency department patient was noted to be afebrile, pulse 81-1 06, respirations 16-21, blood pressures maintained, and O2 saturations as low as 87% on room air with improvement on 2 L of oxygen.  CT scan of the head  and cervical spine did not note any acute fracture or intercranial abnormality.  Labs since 10/4 significant for platelet count noted, sodium 127, CO2 29, chloride 88, AST 43, total bilirubin 1.5, BNP 141.1.  Chest x-ray noted stable cardiac silhouette with concern for interstitial edema pattern.  CT scan of the pelvis was significant for soft tissues contusion or hematoma overlying the posterior lateral right hip.  Noted urinalysis was positive for small hemoglobin and greater than 50 RBCs.   Review of Systems  Unable to perform ROS: Mental status change  Gastrointestinal:  Positive for blood in stool.  Musculoskeletal:  Positive for falls and joint pain.  Psychiatric/Behavioral:  Positive for hallucinations and memory loss.    Past Medical History:  Diagnosis Date   Atrial fibrillation ([place]) 10/15/2012   Cancer ([place])    Skin (R hand)   Embolic stroke ([place]) 62/86/3817   Essential hypertension 01/21/2014   Long term current use of anticoagulant therapy 12/25/2012    Past Surgical History:  Procedure Laterality Date   APPENDECTOMY     CARDIAC CATHETERIZATION     CYSTOSCOPY WITH INSERTION OF UROLIFT N/A 10/10/2017   Procedure: CYSTOSCOPY WITH INSERTION OF UROLIFT;  Surgeon: [name], MD;  Location: [facility];  Service: Urology;  Laterality: N/A;  NEEDS 60 MIN   EYE SURGERY Bilateral 2013   Cataracts     reports that he has quit smoking. His smoking use included cigarettes. He has  a 2.50 pack-year smoking history. He has never used smokeless tobacco. He reports that he does not drink alcohol and does not use drugs.  No Known Allergies  Family History  Problem Relation Age of Onset   Other Mother        Brain hemmorrhage   Other Father        unknown    Prior to Admission medications   Medication Sig Start Date End Date Taking? Authorizing Provider  acetaminophen (TYLENOL) 500 MG tablet Take 500-1,000 mg by mouth every 6 (six) hours as needed (FOR PAIN.).   Yes [provider]  albuterol (VENTOLIN HFA) 108 (90 Base) MCG/ACT inhaler Inhale 1 puff into the lungs every 4 (four) hours as needed for wheezing or shortness of breath. 07/02/20  Yes [provider]  aspirin EC 81 MG tablet Take 81 mg by mouth at bedtime.   Yes [provider]  calcium carbonate (OS-CAL) 600 MG TABS tablet Take 600 mg by mouth daily.    Yes [provider]  Glucosamine HCl (GLUCOSAMINE PO) Take 1,500 mg by mouth daily.    Yes [provider]  hydrochlorothiazide (HYDRODIURIL) 12.5 MG tablet Take 1 tablet (12.5 mg total) by mouth daily. 07/03/17  Yes Belva Crome, MD  lisinopril (PRINIVIL,ZESTRIL) 10 MG tablet Take 10 mg by mouth daily.   Yes [provider]  metoprolol succinate (TOPROL-XL) 25 MG 24 hr tablet TAKE 1 TABLET BY MOUTH DAILY WITH 50 MG TABLET FOR TOTAL DAILY DOSE OF 75 MG. TAKE WITH OR IMMEDIATELY FOLLOWING A MEAL. Please make overdue appt for future refills Thank you Patient taking differently: Take 25 mg by mouth daily. Along with 50 mg tab 09/28/20  Yes Belva Crome, MD  metoprolol succinate (TOPROL-XL) 50 MG 24 hr tablet Take 1 tablet (50 mg total) by mouth daily. Along with a 25mg  tablet for a total of 75mg . Take with or immediately following a meal. Patient taking differently: Take 50 mg by mouth daily. Along with 25 mg tab 01/24/19  Yes Belva Crome, MD  Multiple Vitamin (MULTIVITAMIN) tablet Take 1 tablet by mouth daily.   Yes [provider]  Multiple Vitamins-Minerals (PRESERVISION AREDS 2) CAPS Take 1 capsule by mouth 2 (two) times daily.   Yes [provider]  traMADol (ULTRAM) 50 MG tablet Take 1 tablet (50 mg total) by mouth every 6 (six) hours as needed. Patient taking differently: Take 50 mg by mouth every 6 (six) hours as needed for moderate pain. 08/31/20  Yes Fisher, Linden Dolin, PA-C  warfarin (COUMADIN) 5 MG tablet TAKE AS DIRECTED PER ANTICOAGULATION CLINIC Patient taking differently: Take  5-7.5 mg by mouth See admin instructions. 7.5 mg on Sunday 5 mg Monday,Tuesday,Wednesday,Thursday,Friday,saturday 09/15/20  Yes Bhagat, Bhavinkumar, PA  lidocaine (LIDODERM) 5 % Place 1 patch onto the skin every 12 (twelve) hours. Remove & Discard patch within 12 hours or as directed by MD Patient not taking: No sig reported 08/31/20 08/31/21  Versie Starks, PA-C    Physical Exam:  Constitutional: Elderly male who appears to be in no acute distress at this time Vitals:   10/14/20 0121 10/14/20 0453 10/14/20 0816 10/14/20 0827  BP: (!) 164/90 (!) 159/90 (!) 165/92 (!) 165/92  Pulse: (!) 106 95 88 88  Resp: 16 16  (!) 21  Temp: 98 F (36.7 C)     TempSrc: Oral     SpO2: 94% 95% (!) 87% 96%   Eyes: PERRL, lids and conjunctivae  normal ENMT: Mucous membranes are moist. Posterior pharynx clear of any exudate or lesions.  Neck: normal, supple, no masses, no thyromegaly Respiratory: Lungs sound relatively clear without significant wheezing appreciated at this time.  O2 saturation currently maintained on 2 L nasal cannula oxygen Cardiovascular: Irregular irregular.  At least 2+ pitting bilateral extremity edema. 2+ pedal pulses.  Abdomen: no tenderness, no masses palpated. No hepatosplenomegaly. Bowel sounds positive.  Urine in catheter bag is currently yellow with no signs of blood. Musculoskeletal: no clubbing / cyanosis. No joint deformity upper and lower extremities. Good ROM, no contractures. Normal muscle tone.  Skin: Venous stasis changes of the lower extremities with ulceration of the lateral aspect of the right leg that appears to be healing without significant signs of erythema as seen below.   Neurologic: CN 2-12 grossly intact.  Able to move all 4 extremities Psychiatric: Alert and oriented to self, but states that he is at home and did not know the year at this time.    Labs on Admission: I have personally reviewed following labs and imaging studies  CBC: Recent Labs  Lab  10/13/20 1607  WBC 8.9  NEUTROABS 7.0  HGB 13.6  HCT 40.6  MCV 87.7  PLT PLATELET CLUMPS NOTED ON SMEAR, UNABLE TO ESTIMATE   Basic Metabolic Panel: Recent Labs  Lab 10/13/20 1607  NA 127*  K 3.7  CL 88*  CO2 29  GLUCOSE 110*  BUN 11  CREATININE 0.93  CALCIUM 8.9   GFR: CrCl cannot be calculated (Unknown ideal weight.). Liver Function Tests: Recent Labs  Lab 10/14/20 0716  AST 43*  ALT 27  ALKPHOS 54  BILITOT 1.5*  PROT <3.0*  ALBUMIN 3.5   No results for input(s): LIPASE, AMYLASE in the last 168 hours. No results for input(s): AMMONIA in the last 168 hours. Coagulation Profile: Recent Labs  Lab 10/14/20 0716  INR 2.5*   Cardiac Enzymes: No results for input(s): CKTOTAL, CKMB, CKMBINDEX, TROPONINI in the last 168 hours. BNP (last 3 results) No results for input(s): PROBNP in the last 8760 hours. HbA1C: No results for input(s): HGBA1C in the last 72 hours. CBG: No results for input(s): GLUCAP in the last 168 hours. Lipid Profile: No results for input(s): CHOL, HDL, LDLCALC, TRIG, CHOLHDL, LDLDIRECT in the last 72 hours. Thyroid Function Tests: No results for input(s): TSH, T4TOTAL, FREET4, T3FREE, THYROIDAB in the last 72 hours. Anemia Panel: No results for input(s): VITAMINB12, FOLATE, FERRITIN, TIBC, IRON, RETICCTPCT in the last 72 hours. Urine analysis:    Component Value Date/Time   COLORURINE YELLOW 10/13/2020 1950   APPEARANCEUR CLEAR 10/13/2020 1950   LABSPEC 1.016 10/13/2020 1950   PHURINE 6.0 10/13/2020 1950   GLUCOSEU NEGATIVE 10/13/2020 1950   HGBUR SMALL (A) 10/13/2020 1950   BILIRUBINUR NEGATIVE 10/13/2020 [place] NEGATIVE 10/13/2020 1950   PROTEINUR 100 (A) 10/13/2020 1950   NITRITE NEGATIVE 10/13/2020 1950   LEUKOCYTESUR NEGATIVE 10/13/2020 1950   Sepsis Labs: No results found for this or any previous visit (from the past 240 hour(s)).   Radiological Exams on Admission: DG Chest 2 View  Result Date:  10/14/2020 CLINICAL DATA:  Syncope ,feeling weak,fell down , some sobfall/syncope EXAM: CHEST - 2 VIEW COMPARISON:  Chest x-ray 2011, shoulder radiograph 08/31/2020 FINDINGS: Stable cardiac silhouette. There is linear interstitial thickening in the lungs. Chronic elevation RIGHT hemidiaphragm. No focal consolidation. No pneumothorax. Irregular margin of the LEFT humeral head. IMPRESSION: 1. Interstitial edema pattern. 2. Low lung volumes. 3.  LEFT humeral head fracture versus severe arthropathy. Consider dedicated view of the LEFT shoulder Electronically Signed   By: Suzy Bouchard M.D.   On: 10/14/2020 08:40   DG Pelvis 1-2 Views  Result Date: 10/13/2020 CLINICAL DATA:  Tailbone pain EXAM: PELVIS - 1-2 VIEW COMPARISON:  06/09/2015 FINDINGS: SI joints are non widened. The pubic symphysis and rami are intact. Right hip replacement with intact hardware and normal alignment. Vascular calcifications. There are mild degenerative changes of the left hip. IMPRESSION: Right hip replacement without acute osseous abnormality. Electronically Signed   By: Donavan Foil M.D.   On: 10/13/2020 16:51   CT HEAD WO CONTRAST (5MM)  Result Date: 10/14/2020 CLINICAL DATA:  87 year old male with history of trauma from a fall at home yesterday. EXAM: CT HEAD WITHOUT CONTRAST CT CERVICAL SPINE WITHOUT CONTRAST TECHNIQUE: Multidetector CT imaging of the head and cervical spine was performed following the standard protocol without intravenous contrast. Multiplanar CT image reconstructions of the cervical spine were also generated. COMPARISON:  Head CT 11/13/2015.  Cervical spine CT 10/21/2015. FINDINGS: CT HEAD FINDINGS Brain: Moderate cerebral atrophy with ex vacuo dilatation of the ventricular system. Patchy and confluent areas of decreased attenuation are noted throughout the deep and periventricular white matter of the cerebral hemispheres bilaterally, compatible with chronic microvascular ischemic disease. Well-defined areas of  low attenuation in the left occipital lobe posteromedially, similar to the prior study, compatible with an area of encephalomalacia from remote left PCA territory infarct. Cavum septum pellucidum (normal anatomical variant). No evidence of acute infarction, hemorrhage, hydrocephalus, extra-axial collection or mass lesion/mass effect. Vascular: No hyperdense vessel or unexpected calcification. Skull: Normal. Negative for fracture or focal lesion. Sinuses/Orbits: No acute finding. Other: None. CT CERVICAL SPINE FINDINGS Alignment: Normal. Skull base and vertebrae: No acute fracture. No primary bone lesion or focal pathologic process. Soft tissues and spinal canal: No prevertebral fluid or swelling. No visible canal hematoma. Disc levels: Severe multilevel degenerative disc disease, most pronounced at C4-C5, C5-C6 and. Severe multilevel facet arthropathy bilaterally. Upper chest: Areas of ground-glass attenuation and interlobular septal thickening in the visualize lungs, concerning for pulmonary edema. Other: None. IMPRESSION: 1. No evidence of significant acute traumatic injury to the skull, brain or cervical spine. 2. Moderate cerebral atrophy with extensive chronic microvascular ischemic changes and old left PCA territory infarct. 3. The appearance of the lung apices suggests pulmonary edema. Correlation with chest radiography is recommended. 4. Multilevel degenerative disc disease and cervical spondylosis, as above. Electronically Signed   By: Vinnie Langton M.D.   On: 10/14/2020 08:33   CT Cervical Spine Wo Contrast  Result Date: 10/14/2020 CLINICAL DATA:  87 year old male with history of trauma from a fall at home yesterday. EXAM: CT HEAD WITHOUT CONTRAST CT CERVICAL SPINE WITHOUT CONTRAST TECHNIQUE: Multidetector CT imaging of the head and cervical spine was performed following the standard protocol without intravenous contrast. Multiplanar CT image reconstructions of the cervical spine were also  generated. COMPARISON:  Head CT 11/13/2015.  Cervical spine CT 10/21/2015. FINDINGS: CT HEAD FINDINGS Brain: Moderate cerebral atrophy with ex vacuo dilatation of the ventricular system. Patchy and confluent areas of decreased attenuation are noted throughout the deep and periventricular white matter of the cerebral hemispheres bilaterally, compatible with chronic microvascular ischemic disease. Well-defined areas of low attenuation in the left occipital lobe posteromedially, similar to the prior study, compatible with an area of encephalomalacia from remote left PCA territory infarct. Cavum septum pellucidum (normal anatomical variant). No evidence of acute infarction, hemorrhage, hydrocephalus,  extra-axial collection or mass lesion/mass effect. Vascular: No hyperdense vessel or unexpected calcification. Skull: Normal. Negative for fracture or focal lesion. Sinuses/Orbits: No acute finding. Other: None. CT CERVICAL SPINE FINDINGS Alignment: Normal. Skull base and vertebrae: No acute fracture. No primary bone lesion or focal pathologic process. Soft tissues and spinal canal: No prevertebral fluid or swelling. No visible canal hematoma. Disc levels: Severe multilevel degenerative disc disease, most pronounced at C4-C5, C5-C6 and. Severe multilevel facet arthropathy bilaterally. Upper chest: Areas of ground-glass attenuation and interlobular septal thickening in the visualize lungs, concerning for pulmonary edema. Other: None. IMPRESSION: 1. No evidence of significant acute traumatic injury to the skull, brain or cervical spine. 2. Moderate cerebral atrophy with extensive chronic microvascular ischemic changes and old left PCA territory infarct. 3. The appearance of the lung apices suggests pulmonary edema. Correlation with chest radiography is recommended. 4. Multilevel degenerative disc disease and cervical spondylosis, as above. Electronically Signed   By: Vinnie Langton M.D.   On: 10/14/2020 08:33   CT Lumbar  Spine Wo Contrast  Result Date: 10/14/2020 CLINICAL DATA:  87 year old male status post fall at home yesterday. EXAM: CT LUMBAR SPINE WITHOUT CONTRAST TECHNIQUE: Multidetector CT imaging of the lumbar spine was performed without intravenous contrast administration. Multiplanar CT image reconstructions were also generated. COMPARISON:  [facility] CT Abdomen and Pelvis 01/06/2012 FINDINGS: Segmentation: Normal. Alignment: Stable lumbar lordosis since 2013. No significant spondylolisthesis. Vertebrae: Osteopenia. Chronic degenerative S1 superior endplate sclerosis appears stable. Stable lumbar and lower thoracic vertebral height and alignment. Visible sacrum and SI joints appear intact. Visible posterior lower ribs appear intact. No acute osseous abnormality identified. Paraspinal and other soft tissues: Aortoiliac calcified atherosclerosis. Normal caliber abdominal aorta. Stable visible abdominal viscera. Motion artifact at the left costophrenic angle. Lumbar paraspinal soft tissues are normal except for L2-L3 on the left which is detailed below. Disc levels: Widespread lumbar spine degeneration. Vacuum disc L2-L3 through L5-S1. Chronic appearing degenerative and multifactorial spinal stenosis does not appear significantly changed since 2013, severe at L4-L5. Superimposed chronic oval soft tissue mass occupying the left L2 neural foramen which is smoothly expanded. This mass is best demonstrated on series 1, image 58 and measures 15 x 28 x 13 mm (AP by transverse by CC). It was present in 2013, and is not significantly changed in size or configuration since that time (series 3, image 40 of that exam). IMPRESSION: 1.  No acute traumatic injury identified in the lumbar spine. 2. 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 This has not significantly changed since 2013. 3. Chronic lumbar spine degeneration. Severe chronic  multifactorial spinal stenosis at L4-L5. 4.  Aortic Atherosclerosis (ICD10-I70.0). Electronically Signed   By: Genevie Ann M.D.   On: 10/14/2020 08:45   CT PELVIS WO CONTRAST  Result Date: 10/14/2020 CLINICAL DATA:  87 year old male status post fall yesterday. EXAM: CT PELVIS WITHOUT CONTRAST TECHNIQUE: Multidetector CT imaging of the pelvis was performed following the standard protocol without intravenous contrast. COMPARISON:  Lumbar spine CT today. [facility] CT Abdomen and Pelvis 01/06/2012. FINDINGS: Urinary Tract: Kidneys not included. Chronic postoperative changes to the prostate and floor of the bladder. Streak artifact from chronic right hip arthroplasty. The bladder appears stable since 2013. No hydroureter. Bowel: Nondilated visible large and small bowel. Diverticulosis of the sigmoid colon with no active inflammation identified. No free air or free fluid. Vascular/Lymphatic: Aortoiliac calcified atherosclerosis. No lymphadenopathy. Reproductive: Chronic postoperative changes to  the prostate, otherwise negative. Other:  Chronic pelvic phleboliths. Musculoskeletal: Sacrum, SI joints, pelvis, left hip, proximal left femur, and pubic rami appear stable since 2013 and intact. Chronic right total hip arthroplasty with regional streak artifact and heterotopic ossification, not significantly changed since 2013. The entire right femoral component is not included on the CT images but is visible on the scout view. No acute osseous abnormality identified. However, there is some evidence of superficial subcutaneous hematoma or contusion overlying the posterolateral right hip on series 5, image 31, new compared to 2013. But no other superficial soft tissue injury is identified. IMPRESSION: 1. Superficial soft tissue contusion or hematoma overlying the posterolateral right hip. Underlying right hip arthroplasty appears intact. 2. No other acute traumatic injury identified. Electronically Signed   By:  Genevie Ann M.D.   On: 10/14/2020 09:04   DG Shoulder Left  Result Date: 10/14/2020 CLINICAL DATA:  Fall, pain in left shoulder EXAM: LEFT SHOULDER - 2+ VIEW COMPARISON:  Shoulder radiographs 08/31/2020 FINDINGS: No acute fracture or dislocation is seen. Advanced degenerative changes of the glenohumeral joint are again seen. The soft tissues are unremarkable. IMPRESSION: No acute fracture or dislocation identified. Electronically Signed   By: Valetta Mole M.D.   On: 10/14/2020 09:59    EKG: Independently reviewed.  Atrial fibrillation at 96 bpm  Assessment/Plan Acute respiratory failure with hypoxia secondary to suspected diastolic congestive heart failure exacerbation: Patient presents with O2 saturations as low as 87% on room air with improvement on 2 L nasal cannula oxygen.  On physical exam patient noted to have at least 2+ pitting lower extremity edema.  Chest x-ray significant for interstitial edema pattern with low lung volumes.  BNP was elevated at 141.1.  Last echocardiogram revealed EF of 65% back in 2018.  Suspect patient is acutely fluid overloaded with mild symptoms. -Admit to medical telemetry. -Heart failure orders set  initiated  -Continuous pulse oximetry with nasal cannula oxygen as needed to keep O2 saturations >92% -Strict I&Os and daily weights -Elevate lower extremities -Lasix 40 mg IV Bid  -Reassess in a.m. and adjust diuresis as needed. -Check echocardiogram -Message sent for cardiology to evaluate in a.m.  Acute metabolic encephalopathy: In talking to the patient's son it seems that at baseline the patient has some dementia, but was more acutely altered with reports of hallucinations.  CT scan of the head was negative for any signs of an acute bleed or signs of a stroke.  No clear source of infection appreciated at this time.  Unclear cause of symptoms at this time. -Neurochecks -Seizure precautions -Follow-up venous blood -Check blood cultures -Check TSH and  ammonia -Check EEG -May warrant further work-up and/or consult to neurology depending on findings  Fall at home with hip contusion/hematoma: Acute.  Patient reportedly had been more weak over the last 4 days prior to falling.  No acute fractures noted on imaging studies. -Check CK level -Continue to monitor blood counts -PT to eval and treat  Persistent atrial fibrillation on chronic anticoagulation: Patient was found to be in atrial fibrillation, but appears to be relatively rate controlled.  INR therapeutic at 2.5 and therefore the low or suspicion for a pulmonary embolus. -Continue metoprolol -Coumadin per pharmacy  Hyponatremia: Acute.  On admission sodium was noted to be as low as 127.  Patient sodium level had previously been within normal limits in the past. -Check urine sodium and urine osmolarity -Serial monitoring of sodium levels with diuresis  Hematuria: Acute.  Urinalysis was  significant for blood present in urine, but hemoglobin has been stable.  Urine currently appears to be yellow in appearance without signs of bleeding.  Suspect this could have initially been traumatic in nature as patient is not on anticoagulation. -Continue to monitor blood counts  Essential hypertension: Home blood pressure medications include metoprolol succinate 75 mg daily, and lisinopril 10 mg daily. -Continue home medication regimen  Elevated AST and hyperbilirubinemia: On admission AST was just mildly elevated at 43.  Denies any significant history of alcohol abuse. -Check ammonia level  DVT prophylaxis: Coumadin Code Status: Full Family Communication: Son updated at bedside Disposition Plan: to be determined  Consults called: Message sent for cardiology to eval in a.m. Admission status: Inpatient, require more than 2 midnight stay to work-up for acute delirium and hyponatremia  Norval Morton MD [facility]   If 7PM-7AM, please contact night-coverage   10/14/2020, 11:54 AM

## 2020-10-14 NOTE — Telephone Encounter (Signed)
Pt fell and is in hospital. Was told that he may or may not be here for his appt.   CB [phone]

## 2020-10-14 NOTE — ED Notes (Signed)
The patient's vitals will updated upon arrival from imaging

## 2020-10-15 ENCOUNTER — Inpatient Hospital Stay (HOSPITAL_COMMUNITY): Payer: Medicare Other

## 2020-10-15 ENCOUNTER — Ambulatory Visit: Payer: Medicare Other | Admitting: Physician Assistant

## 2020-10-15 DIAGNOSIS — J9601 Acute respiratory failure with hypoxia: Secondary | ICD-10-CM | POA: Diagnosis not present

## 2020-10-15 DIAGNOSIS — I10 Essential (primary) hypertension: Secondary | ICD-10-CM

## 2020-10-15 DIAGNOSIS — I4891 Unspecified atrial fibrillation: Secondary | ICD-10-CM

## 2020-10-15 DIAGNOSIS — I5031 Acute diastolic (congestive) heart failure: Secondary | ICD-10-CM | POA: Diagnosis not present

## 2020-10-15 DIAGNOSIS — R4182 Altered mental status, unspecified: Secondary | ICD-10-CM

## 2020-10-15 LAB — COMPREHENSIVE METABOLIC PANEL
ALT: 24 U/L (ref 0–44)
AST: 41 U/L (ref 15–41)
Albumin: 3.2 g/dL — ABNORMAL LOW (ref 3.5–5.0)
Alkaline Phosphatase: 56 U/L (ref 38–126)
Anion gap: 8 (ref 5–15)
BUN: 11 mg/dL (ref 8–23)
CO2: 29 mmol/L (ref 22–32)
Calcium: 8.7 mg/dL — ABNORMAL LOW (ref 8.9–10.3)
Chloride: 88 mmol/L — ABNORMAL LOW (ref 98–111)
Creatinine, Ser: 0.86 mg/dL (ref 0.61–1.24)
GFR, Estimated: >60 mL/min (ref >60)
Glucose, Bld: 111 mg/dL — ABNORMAL HIGH (ref 70–99)
Potassium: 3.7 mmol/L (ref 3.5–5.1)
Sodium: 125 mmol/L — ABNORMAL LOW (ref 135–145)
Total Bilirubin: 1.8 mg/dL — ABNORMAL HIGH (ref 0.3–1.2)
Total Protein: 7.4 g/dL (ref 6.5–8.1)

## 2020-10-15 LAB — CBC
HCT: 40.9 % (ref 39.0–52.0)
Hemoglobin: 13.6 g/dL (ref 13.0–17.0)
MCH: 28.8 pg (ref 26.0–34.0)
MCHC: 33.3 g/dL (ref 30.0–36.0)
MCV: 86.5 fL (ref 80.0–100.0)
Platelets: UNDETERMINED 10*3/uL (ref 150–400)
RBC: 4.73 MIL/uL (ref 4.22–5.81)
RDW: 13.5 % (ref 11.5–15.5)
WBC: 8.7 10*3/uL (ref 4.0–10.5)
nRBC: 0 % (ref 0.0–0.2)

## 2020-10-15 LAB — LIPID PANEL
Cholesterol: 137 mg/dL (ref 0–200)
HDL: 45 mg/dL (ref >40)
LDL Cholesterol: 75 mg/dL (ref 0–99)
Total CHOL/HDL Ratio: 3 RATIO
Triglycerides: 87 mg/dL (ref <150)
VLDL: 17 mg/dL (ref 0–40)

## 2020-10-15 LAB — RPR: RPR Ser Ql: NONREACTIVE

## 2020-10-15 LAB — OSMOLALITY: Osmolality: 274 mOsm/kg — ABNORMAL LOW (ref 275–295)

## 2020-10-15 LAB — ECHOCARDIOGRAM COMPLETE
AR max vel: 2.79 cm2
AV Area VTI: 2.96 cm2
AV Area mean vel: 2.84 cm2
AV Mean grad: 4 mmHg
AV Peak grad: 7.4 mmHg
Ao pk vel: 1.36 m/s
S' Lateral: 3.3 cm
Weight: 3453.29 oz

## 2020-10-15 LAB — PROTIME-INR
INR: 2.4 — ABNORMAL HIGH (ref 0.8–1.2)
Prothrombin Time: 26.1 seconds — ABNORMAL HIGH (ref 11.4–15.2)

## 2020-10-15 MED ORDER — METOPROLOL SUCCINATE ER 100 MG PO TB24
100.0000 mg | ORAL_TABLET | Freq: Every day | ORAL | Status: DC
  Administered 2020-10-16 – 2020-10-22 (×6): 100 mg via ORAL
  Filled 2020-10-15 (×7): qty 1

## 2020-10-15 MED ORDER — WARFARIN SODIUM 5 MG PO TABS
5.0000 mg | ORAL_TABLET | Freq: Once | ORAL | Status: AC
  Administered 2020-10-15: 5 mg via ORAL
  Filled 2020-10-15: qty 1

## 2020-10-15 MED ORDER — METOPROLOL TARTRATE 5 MG/5ML IV SOLN: 5.0000 mg | Freq: Four times a day (QID) | INTRAVENOUS | Status: DC | PRN

## 2020-10-15 MED ORDER — INFLUENZA VAC A&B SA ADJ QUAD 0.5 ML IM PRSY
0.5000 mL | PREFILLED_SYRINGE | INTRAMUSCULAR | Status: AC
  Administered 2020-10-17: 0.5 mL via INTRAMUSCULAR
  Filled 2020-10-15: qty 0.5

## 2020-10-15 NOTE — Progress Notes (Signed)
Mobility Specialist Progress Note:   10/15/20 1130  Therapy Vitals  Pulse Rate (!) 110  Mobility  Activity Transferred to/from [facility]  Level of Assistance Maximum assist, patient does 25-49%  Assistive Device Stedy  Mobility Out of bed to chair with meals  Mobility Response Tolerated fair  Mobility performed by Mobility specialist  $Mobility charge 1 Mobility   Pre Mobility: HR 110 bpm During Mobility: HR 156 bpm Post Mobility: HR 121 bpm  Pt received in chair, agreed to mobility. Transferred to [facility] with Stedy. Required ModA+2 to stand and max cues for upright posture. Pt in Afib after mobility. RN notified. Pt left in bed with all needs met and NT present.  Nelta Numbers Mobility Specialist  Phone [phone]

## 2020-10-15 NOTE — Procedures (Signed)
Patient Name: [name]  MRN: [id number]  Epilepsy Attending: Lora Havens  Referring Physician/Provider: Dr Fuller Plan Date: 10/15/2020 Duration: 23.41 mins  Patient history: 87 year old male with altered mental status.  EEG to evaluate for seizures.  Level of alertness: Awake  AEDs during EEG study: None  Technical aspects: This EEG study was done with scalp electrodes positioned according to the 10-20 International system of electrode placement. Electrical activity was acquired at a sampling rate of 500Hz  and reviewed with a high frequency filter of 70Hz  and a low frequency filter of 1Hz . EEG data were recorded continuously and digitally stored.   Description: The posterior dominant rhythm consists of 8 Hz activity of moderate voltage (25-35 uV) seen predominantly in posterior head regions, symmetric and reactive to eye opening and eye closing. Hyperventilation and photic stimulation were not performed.     IMPRESSION: This study is within normal limits. No seizures or epileptiform discharges were seen throughout the recording.  [name]

## 2020-10-15 NOTE — Progress Notes (Signed)
PROGRESS NOTE    [name]  DGL:[id number] DOB: [birth year] DOA: 10/13/2020 PCP: Gaynelle Arabian, MD    Brief Narrative: [name] is a 87 y.o. male with medical history significant of atrial fibrillation on chronic anticoagulation, embolic CVA, and venous stasis ulcers who presents after having an unwitnessed fall yesterday morning at around 4 AM.  Patient is unable to give history at this time as he is altered and obtained from his son.  Patient was found on the floor and thought that he likely fell backwards.  His son was able to help pick him up, but the patient was complaining of left shoulder and right leg pain.  His son notes that at some point they found some blood present in his stool when he had a bowel movement, but when they wiped him they did not notice any further blood.  Upon further questioning it appears the patient is 4 days and has been more weak.  At baseline patient poor recent memory, but was more confused than normal and not oriented to place or time.  Reported that it seemed like he was hallucinating complaining of the floor moving and saying things like "want to play this game" that did not make sense.  Associated symptoms include swelling of his leg and he has been followed by wound care for venous stasis ulcers.  While in the emergency department patient fell asleep twice and had shaking of his arms that lasted briefly.  No reports of tongue biting, incontinence of urine, or fevers.  He is not on oxygen at baseline and he quit smoking several years ago.   ED Course: Upon admission into the emergency department patient was noted to be afebrile, pulse 81-1 06, respirations 16-21, blood pressures maintained, and O2 saturations as low as 87% on room air with improvement on 2 L of oxygen.  CT scan of the head and cervical spine did not note any acute fracture or intercranial abnormality.  Labs since 10/4 significant for platelet count noted, sodium 127, CO2 29, chloride 88, AST  43, total bilirubin 1.5, BNP 141.1.  Chest x-ray noted stable cardiac silhouette with concern for interstitial edema pattern.  CT scan of the pelvis was significant for soft tissues contusion or hematoma overlying the posterior lateral right hip.  Noted urinalysis was positive for small hemoglobin and greater than 50 RBCs.  Assessment & Plan:   Principal Problem:   Acute respiratory failure with hypoxia (HCC) Active Problems:   Atrial fibrillation (HCC)   Long term current use of anticoagulant therapy   Essential hypertension   Fall at home, initial encounter   Hyponatremia   Hyperbilirubinemia   Acute diastolic CHF (congestive heart failure) ([place])    #1 acute hypoxic respiratory failure secondary to acute diastolic heart failure exacerbation. Echo during this admission is pending.  Echo from 2018 with EF of 65%.   Patient was hypoxic at 87% on room air on admission with 2+ bilateral pitting edema and chest x-ray consistent with pulmonary edema and elevated BNP. Continue Lasix twice daily IV. Appreciate cardiology input.   #2 acute metabolic encephalopathy likely from hypoxia secondary to #1.  Patient lives alone at home however his son lives at the back of the house and keeps an eye on him. TSH normal Ammonia not elevated EEG no seizures or epileptiform discharges normal study.  #3chronic afib on coumadin INR therapeutic  #4 fall at home x-rays of the hip shows no acute fracture PT recommending SNF.  #5  hypervolemic hyponatremia -likely multifactorial  He was on HCTZ at home which has been stopped  Normal TSH   sodium dropped to 125 from 130 on admission Avoid hypotonic fluids Repeat labs in a.m.  #6 history of essential hypertension continue current meds  #7 chronic venous stasis dermatitis with 2 open wound on the right lower extremity seen by wound care nurse.  Secure with foam dressing Wrap the right leg with Kerlix and Coban change 3 times a week.    Estimated  body mass index is 30.97 kg/m as calculated from the following:   Height as of 08/31/20: 5\' 10"  (1.778 m).   Weight as of this encounter: 97.9 kg.  DVT prophylaxis: coumadin Code Status:full Family Communication: dw son Disposition Plan:  Status is: Inpatient  Remains inpatient appropriate because:IV treatments appropriate due to intensity of illness or inability to take PO  Dispo: The patient is from: Home              Anticipated d/c is to: Home              Patient currently is not medically stable to d/c.   Difficult to place patient No   Consultants:  cardio  Procedures: none Antimicrobials: None  Subjective:  Patient is resting in bed son by the bedside he feels his father's mentation has improved and breathing has improved Objective: Vitals:   10/15/20 0422 10/15/20 1038 10/15/20 1130 10/15/20 1231  BP: (!) 150/85 (!) 152/68  (!) 141/88  Pulse: 97 (!) 111 (!) 110 (!) 111  Resp: 20   20  Temp: 97.9 F (36.6 C)   98.4 F (36.9 C)  TempSrc: Axillary   Oral  SpO2: 100%   95%  Weight: 97.9 kg       Intake/Output Summary (Last 24 hours) at 10/15/2020 1438 Last data filed at 10/15/2020 1233 Gross per 24 hour  Intake 360 ml  Output 1375 ml  Net -1015 ml   Filed Weights   10/15/20 0422  Weight: 97.9 kg    Examination:  General exam: Appears calm and comfortable  Respiratory system: Crackles at the bases to auscultation. Respiratory effort normal. Cardiovascular system: S1 & S2 heard, RRR. No JVD, murmurs, rubs, gallops or clicks. No pedal edema. Gastrointestinal system: Abdomen is nondistended, soft and nontender. No organomegaly or masses felt. Normal bowel sounds heard. Central nervous system: Alert and oriented. No focal neurological deficits. Extremities: 2+ bilateral pitting edema  skin: Right lower extremity to open wounds Psychiatry: Judgement and insight appear normal. Mood & affect appropriate.     Data Reviewed: I have personally reviewed  following labs and imaging studies  CBC: Recent Labs  Lab 10/13/20 1607 10/14/20 1437 10/15/20 0129  WBC 8.9  --  8.7  NEUTROABS 7.0  --   --   HGB 13.6 15.6 13.6  HCT 40.6 46.0 40.9  MCV 87.7  --  86.5  PLT PLATELET CLUMPS NOTED ON SMEAR, UNABLE TO ESTIMATE  --  PLATELET CLUMPS NOTED ON SMEAR, UNABLE TO ESTIMATE   Basic Metabolic Panel: Recent Labs  Lab 10/13/20 1607 10/14/20 1437 10/15/20 0129  NA 127* 130* 125*  K 3.7 4.0 3.7  CL 88*  --  88*  CO2 29  --  29  GLUCOSE 110*  --  111*  BUN 11  --  11  CREATININE 0.93  --  0.86  CALCIUM 8.9  --  8.7*   GFR: Estimated Creatinine Clearance: 69.7 mL/min (by C-G formula based on  SCr of 0.86 mg/dL). Liver Function Tests: Recent Labs  Lab 10/14/20 0716 10/15/20 0129  AST 43* 41  ALT 27 24  ALKPHOS 54 56  BILITOT 1.5* 1.8*  PROT <3.0* 7.4  ALBUMIN 3.5 3.2*   No results for input(s): LIPASE, AMYLASE in the last 168 hours. Recent Labs  Lab 10/14/20 1426  AMMONIA 23   Coagulation Profile: Recent Labs  Lab 10/14/20 0716 10/15/20 0129  INR 2.5* 2.4*   Cardiac Enzymes: Recent Labs  Lab 10/14/20 1426  CKTOTAL 413*   BNP (last 3 results) No results for input(s): PROBNP in the last 8760 hours. HbA1C: No results for input(s): HGBA1C in the last 72 hours. CBG: No results for input(s): GLUCAP in the last 168 hours. Lipid Profile: No results for input(s): CHOL, HDL, LDLCALC, TRIG, CHOLHDL, LDLDIRECT in the last 72 hours. Thyroid Function Tests: Recent Labs    10/14/20 1426  TSH 0.991   Anemia Panel: No results for input(s): VITAMINB12, FOLATE, FERRITIN, TIBC, IRON, RETICCTPCT in the last 72 hours. Sepsis Labs: Recent Labs  Lab 10/14/20 1942  PROCALCITON <0.10    Recent Results (from the past 240 hour(s))  Resp Panel by RT-PCR (Flu A&B, Covid) Nasopharyngeal Swab     Status: None   Collection Time: 10/14/20 12:42 PM   Specimen: Nasopharyngeal Swab; Nasopharyngeal(NP) swabs in vial transport medium   Result Value Ref Range Status   SARS Coronavirus 2 by RT PCR NEGATIVE NEGATIVE Final    Comment: (NOTE) SARS-CoV-2 target nucleic acids are NOT DETECTED.  The SARS-CoV-2 RNA is generally detectable in upper respiratory specimens during the acute phase of infection. The lowest concentration of SARS-CoV-2 viral copies this assay can detect is 138 copies/mL. A negative result does not preclude SARS-Cov-2 infection and should not be used as the sole basis for treatment or other patient management decisions. A negative result may occur with  improper specimen collection/handling, submission of specimen other than nasopharyngeal swab, presence of viral mutation(s) within the areas targeted by this assay, and inadequate number of viral copies(<138 copies/mL). A negative result must be combined with clinical observations, patient history, and epidemiological information. The expected result is Negative.  Fact Sheet for Patients:  EntrepreneurPulse.com.au  Fact Sheet for Healthcare Providers:  IncredibleEmployment.be  This test is no t yet approved or cleared by the [place] FDA and  has been authorized for detection and/or diagnosis of SARS-CoV-2 by FDA under an Emergency Use Authorization (EUA). This EUA will remain  in effect (meaning this test can be used) for the duration of the COVID-19 declaration under Section 564(b)(1) of the Act, 21 U.S.C.section 360bbb-3(b)(1), unless the authorization is terminated  or revoked sooner.       Influenza A by PCR NEGATIVE NEGATIVE Final   Influenza B by PCR NEGATIVE NEGATIVE Final    Comment: (NOTE) The Xpert Xpress SARS-CoV-2/FLU/RSV plus assay is intended as an aid in the diagnosis of influenza from Nasopharyngeal swab specimens and should not be used as a sole basis for treatment. Nasal washings and aspirates are unacceptable for Xpert Xpress SARS-CoV-2/FLU/RSV testing.  Fact Sheet for  Patients: EntrepreneurPulse.com.au  Fact Sheet for Healthcare Providers: IncredibleEmployment.be  This test is not yet approved or cleared by the [place] FDA and has been authorized for detection and/or diagnosis of SARS-CoV-2 by FDA under an Emergency Use Authorization (EUA). This EUA will remain in effect (meaning this test can be used) for the duration of the COVID-19 declaration under Section 564(b)(1) of the Act, 21 U.S.C.  section 360bbb-3(b)(1), unless the authorization is terminated or revoked.  Performed at [facility], [place] [address], [place], [place] [address]   Culture, blood (routine x 2)     Status: None (Preliminary result)   Collection Time: 10/14/20  9:25 PM   Specimen: BLOOD RIGHT HAND  Result Value Ref Range Status   Specimen Description BLOOD RIGHT HAND  Final   Special Requests   Final    BOTTLES DRAWN AEROBIC AND ANAEROBIC Blood Culture adequate volume   Culture   Final    NO GROWTH < 12 HOURS Performed at [facility], [place] [address], [place], [place] [address]    Report Status PENDING  Incomplete         Radiology Studies: DG Chest 2 View  Result Date: 10/14/2020 CLINICAL DATA:  Syncope ,feeling weak,fell down , some sobfall/syncope EXAM: CHEST - 2 VIEW COMPARISON:  Chest x-ray 2011, shoulder radiograph 08/31/2020 FINDINGS: Stable cardiac silhouette. There is linear interstitial thickening in the lungs. Chronic elevation RIGHT hemidiaphragm. No focal consolidation. No pneumothorax. Irregular margin of the LEFT humeral head. IMPRESSION: 1. Interstitial edema pattern. 2. Low lung volumes. 3. LEFT humeral head fracture versus severe arthropathy. Consider dedicated view of the LEFT shoulder Electronically Signed   By: Suzy Bouchard M.D.   On: 10/14/2020 08:40   DG Pelvis 1-2 Views  Result Date: 10/13/2020 CLINICAL DATA:  Tailbone pain EXAM: PELVIS - 1-2 VIEW COMPARISON:  06/09/2015 FINDINGS: SI  joints are non widened. The pubic symphysis and rami are intact. Right hip replacement with intact hardware and normal alignment. Vascular calcifications. There are mild degenerative changes of the left hip. IMPRESSION: Right hip replacement without acute osseous abnormality. Electronically Signed   By: Donavan Foil M.D.   On: 10/13/2020 16:51   CT HEAD WO CONTRAST (5MM)  Result Date: 10/14/2020 CLINICAL DATA:  87 year old male with history of trauma from a fall at home yesterday. EXAM: CT HEAD WITHOUT CONTRAST CT CERVICAL SPINE WITHOUT CONTRAST TECHNIQUE: Multidetector CT imaging of the head and cervical spine was performed following the standard protocol without intravenous contrast. Multiplanar CT image reconstructions of the cervical spine were also generated. COMPARISON:  Head CT 11/13/2015.  Cervical spine CT 10/21/2015. FINDINGS: CT HEAD FINDINGS Brain: Moderate cerebral atrophy with ex vacuo dilatation of the ventricular system. Patchy and confluent areas of decreased attenuation are noted throughout the deep and periventricular white matter of the cerebral hemispheres bilaterally, compatible with chronic microvascular ischemic disease. Well-defined areas of low attenuation in the left occipital lobe posteromedially, similar to the prior study, compatible with an area of encephalomalacia from remote left PCA territory infarct. Cavum septum pellucidum (normal anatomical variant). No evidence of acute infarction, hemorrhage, hydrocephalus, extra-axial collection or mass lesion/mass effect. Vascular: No hyperdense vessel or unexpected calcification. Skull: Normal. Negative for fracture or focal lesion. Sinuses/Orbits: No acute finding. Other: None. CT CERVICAL SPINE FINDINGS Alignment: Normal. Skull base and vertebrae: No acute fracture. No primary bone lesion or focal pathologic process. Soft tissues and spinal canal: No prevertebral fluid or swelling. No visible canal hematoma. Disc levels: Severe  multilevel degenerative disc disease, most pronounced at C4-C5, C5-C6 and. Severe multilevel facet arthropathy bilaterally. Upper chest: Areas of ground-glass attenuation and interlobular septal thickening in the visualize lungs, concerning for pulmonary edema. Other: None. IMPRESSION: 1. No evidence of significant acute traumatic injury to the skull, brain or cervical spine. 2. Moderate cerebral atrophy with extensive chronic microvascular ischemic changes and old left PCA territory infarct.  3. The appearance of the lung apices suggests pulmonary edema. Correlation with chest radiography is recommended. 4. Multilevel degenerative disc disease and cervical spondylosis, as above. Electronically Signed   By: Vinnie Langton M.D.   On: 10/14/2020 08:33   CT Cervical Spine Wo Contrast  Result Date: 10/14/2020 CLINICAL DATA:  87 year old male with history of trauma from a fall at home yesterday. EXAM: CT HEAD WITHOUT CONTRAST CT CERVICAL SPINE WITHOUT CONTRAST TECHNIQUE: Multidetector CT imaging of the head and cervical spine was performed following the standard protocol without intravenous contrast. Multiplanar CT image reconstructions of the cervical spine were also generated. COMPARISON:  Head CT 11/13/2015.  Cervical spine CT 10/21/2015. FINDINGS: CT HEAD FINDINGS Brain: Moderate cerebral atrophy with ex vacuo dilatation of the ventricular system. Patchy and confluent areas of decreased attenuation are noted throughout the deep and periventricular white matter of the cerebral hemispheres bilaterally, compatible with chronic microvascular ischemic disease. Well-defined areas of low attenuation in the left occipital lobe posteromedially, similar to the prior study, compatible with an area of encephalomalacia from remote left PCA territory infarct. Cavum septum pellucidum (normal anatomical variant). No evidence of acute infarction, hemorrhage, hydrocephalus, extra-axial collection or mass lesion/mass effect.  Vascular: No hyperdense vessel or unexpected calcification. Skull: Normal. Negative for fracture or focal lesion. Sinuses/Orbits: No acute finding. Other: None. CT CERVICAL SPINE FINDINGS Alignment: Normal. Skull base and vertebrae: No acute fracture. No primary bone lesion or focal pathologic process. Soft tissues and spinal canal: No prevertebral fluid or swelling. No visible canal hematoma. Disc levels: Severe multilevel degenerative disc disease, most pronounced at C4-C5, C5-C6 and. Severe multilevel facet arthropathy bilaterally. Upper chest: Areas of ground-glass attenuation and interlobular septal thickening in the visualize lungs, concerning for pulmonary edema. Other: None. IMPRESSION: 1. No evidence of significant acute traumatic injury to the skull, brain or cervical spine. 2. Moderate cerebral atrophy with extensive chronic microvascular ischemic changes and old left PCA territory infarct. 3. The appearance of the lung apices suggests pulmonary edema. Correlation with chest radiography is recommended. 4. Multilevel degenerative disc disease and cervical spondylosis, as above. Electronically Signed   By: Vinnie Langton M.D.   On: 10/14/2020 08:33   CT Lumbar Spine Wo Contrast  Result Date: 10/14/2020 CLINICAL DATA:  87 year old male status post fall at home yesterday. EXAM: CT LUMBAR SPINE WITHOUT CONTRAST TECHNIQUE: Multidetector CT imaging of the lumbar spine was performed without intravenous contrast administration. Multiplanar CT image reconstructions were also generated. COMPARISON:  [facility] CT Abdomen and Pelvis 01/06/2012 FINDINGS: Segmentation: Normal. Alignment: Stable lumbar lordosis since 2013. No significant spondylolisthesis. Vertebrae: Osteopenia. Chronic degenerative S1 superior endplate sclerosis appears stable. Stable lumbar and lower thoracic vertebral height and alignment. Visible sacrum and SI joints appear intact. Visible posterior lower ribs appear intact.  No acute osseous abnormality identified. Paraspinal and other soft tissues: Aortoiliac calcified atherosclerosis. Normal caliber abdominal aorta. Stable visible abdominal viscera. Motion artifact at the left costophrenic angle. Lumbar paraspinal soft tissues are normal except for L2-L3 on the left which is detailed below. Disc levels: Widespread lumbar spine degeneration. Vacuum disc L2-L3 through L5-S1. Chronic appearing degenerative and multifactorial spinal stenosis does not appear significantly changed since 2013, severe at L4-L5. Superimposed chronic oval soft tissue mass occupying the left L2 neural foramen which is smoothly expanded. This mass is best demonstrated on series 1, image 58 and measures 15 x 28 x 13 mm (AP by transverse by CC). It was present in 2013, and is not significantly changed in size  or configuration since that time (series 3, image 40 of that exam). IMPRESSION: 1.  No acute traumatic injury identified in the lumbar spine. 2. 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 This has not significantly changed since 2013. 3. Chronic lumbar spine degeneration. Severe chronic multifactorial spinal stenosis at L4-L5. 4.  Aortic Atherosclerosis (ICD10-I70.0). Electronically Signed   By: Genevie Ann M.D.   On: 10/14/2020 08:45   CT PELVIS WO CONTRAST  Result Date: 10/14/2020 CLINICAL DATA:  87 year old male status post fall yesterday. EXAM: CT PELVIS WITHOUT CONTRAST TECHNIQUE: Multidetector CT imaging of the pelvis was performed following the standard protocol without intravenous contrast. COMPARISON:  Lumbar spine CT today. [facility] CT Abdomen and Pelvis 01/06/2012. FINDINGS: Urinary Tract: Kidneys not included. Chronic postoperative changes to the prostate and floor of the bladder. Streak artifact from chronic right hip arthroplasty. The bladder appears stable since 2013. No hydroureter. Bowel: Nondilated  visible large and small bowel. Diverticulosis of the sigmoid colon with no active inflammation identified. No free air or free fluid. Vascular/Lymphatic: Aortoiliac calcified atherosclerosis. No lymphadenopathy. Reproductive: Chronic postoperative changes to the prostate, otherwise negative. Other:  Chronic pelvic phleboliths. Musculoskeletal: Sacrum, SI joints, pelvis, left hip, proximal left femur, and pubic rami appear stable since 2013 and intact. Chronic right total hip arthroplasty with regional streak artifact and heterotopic ossification, not significantly changed since 2013. The entire right femoral component is not included on the CT images but is visible on the scout view. No acute osseous abnormality identified. However, there is some evidence of superficial subcutaneous hematoma or contusion overlying the posterolateral right hip on series 5, image 31, new compared to 2013. But no other superficial soft tissue injury is identified. IMPRESSION: 1. Superficial soft tissue contusion or hematoma overlying the posterolateral right hip. Underlying right hip arthroplasty appears intact. 2. No other acute traumatic injury identified. Electronically Signed   By: Genevie Ann M.D.   On: 10/14/2020 09:04   DG Shoulder Left  Result Date: 10/14/2020 CLINICAL DATA:  Fall, pain in left shoulder EXAM: LEFT SHOULDER - 2+ VIEW COMPARISON:  Shoulder radiographs 08/31/2020 FINDINGS: No acute fracture or dislocation is seen. Advanced degenerative changes of the glenohumeral joint are again seen. The soft tissues are unremarkable. IMPRESSION: No acute fracture or dislocation identified. Electronically Signed   By: Valetta Mole M.D.   On: 10/14/2020 09:59   EEG adult  Result Date: 10/15/2020 Lora Havens, MD     10/15/2020 11:02 AM Patient Name: EBUBECHUKWU JEDLICKA MRN: [id number] Epilepsy Attending: Lora Havens Referring Physician/Provider: Dr Fuller Plan Date: 10/15/2020 Duration: 23.41 mins Patient history:  87 year old male with altered mental status.  EEG to evaluate for seizures. Level of alertness: Awake AEDs during EEG study: None Technical aspects: This EEG study was done with scalp electrodes positioned according to the 10-20 International system of electrode placement. Electrical activity was acquired at a sampling rate of 500Hz  and reviewed with a high frequency filter of 70Hz  and a low frequency filter of 1Hz . EEG data were recorded continuously and digitally stored. Description: The posterior dominant rhythm consists of 8 Hz activity of moderate voltage (25-35 uV) seen predominantly in posterior head regions, symmetric and reactive to eye opening and eye closing. Hyperventilation and photic stimulation were not performed.   IMPRESSION: This study is within normal limits. No seizures or epileptiform discharges were seen throughout the recording. Priyanka Barbra Sarks  Scheduled Meds:  furosemide  40 mg Intravenous BID   [START ON 10/16/2020] influenza vaccine adjuvanted  0.5 mL Intramuscular Tomorrow-1000   lisinopril  10 mg Oral Daily   [START ON 10/16/2020] metoprolol succinate  100 mg Oral Daily   sodium chloride flush  3 mL Intravenous Q12H   warfarin  5 mg Oral ONCE-1600   Warfarin - Pharmacist Dosing Inpatient   Does not apply q1600   Continuous Infusions:   LOS: 1 day    Time spent:    Georgette Shell, MD  10/15/2020, 2:38 PM

## 2020-10-15 NOTE — Progress Notes (Deleted)
Cardiology Office Note    Date:  10/15/2020   ID:  [name], [name] June 25, 1930, MRN [id number]   PCP:  Gaynelle Arabian, [place]  Cardiologist:  Sinclair Grooms, MD *** Advanced Practice Provider:  No care team member to display Electrophysiologist:  None   [phone]   No chief complaint on file.   History of Present Illness:  [name] is a 87 y.o. 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     Allergies:   Patient has no known allergies.   Social History   Socioeconomic History   Marital status: Married    Spouse name: Not on file   Number of children: Not on file   Years of education: Not on file   Highest education level: Not on file  Occupational History   Not on file  Tobacco Use   Smoking status: Former    Packs/day: 0.25    Years: 10.00    Pack years: 2.50    Types: Cigarettes   Smokeless tobacco: Never  Vaping Use   Vaping Use: Never used  Substance and Sexual Activity   Alcohol use: No    Alcohol/week: 0.0 standard drinks   Drug use: No   Sexual activity: Not on file  Other Topics Concern   Not on file  Social History Narrative   Not on file   Social Determinants of Health   Financial Resource  Strain: Not on file  Food Insecurity: Not on file  Transportation Needs: Not on file  Physical Activity: Not on file  Stress: Not on file  Social Connections: Not on file     Family History:  The patient's ***family history includes Other in his father and mother.   ROS:   Please see the history of present illness.    ROS All other systems reviewed and are negative.   PHYSICAL EXAM:   VS:  There were no vitals taken for this visit.  Physical Exam  GEN: Well nourished, well developed, in no acute distress  HEENT: normal  Neck: no JVD, carotid bruits, or masses Cardiac:RRR; no murmurs, rubs, or gallops  Respiratory:  clear to auscultation bilaterally, normal work of breathing GI: soft, nontender, nondistended, + BS Ext: without cyanosis, clubbing, or edema, Good distal pulses bilaterally MS: no deformity or atrophy  Skin: warm and dry, no rash Neuro:  Alert and Oriented x 3, Strength and sensation are intact Psych: euthymic mood, full affect  Wt Readings from Last 3 Encounters:  10/15/20 215 lb 13.3 oz (97.9 kg)  08/31/20 200 lb (90.7 kg)  01/24/19 216 lb (98 kg)      Studies/Labs Reviewed:   EKG:  EKG is***  ordered today.  The ekg ordered today demonstrates ***  Recent Labs: 10/14/2020: B Natriuretic Peptide 141.1; TSH 0.991 10/15/2020: ALT 24; BUN 11; Creatinine, Ser 0.86; Hemoglobin 13.6; Platelets PLATELET CLUMPS NOTED ON SMEAR, UNABLE TO ESTIMATE; Potassium 3.7; Sodium 125   Lipid Panel No results found for: CHOL, TRIG, HDL, CHOLHDL, VLDL, LDLCALC, LDLDIRECT  Additional studies/ records that were reviewed today include:  ***   Risk Assessment/Calculations:   {Does this patient have ATRIAL FIBRILLATION?:[phone]}     ASSESSMENT:    No diagnosis found.   PLAN:  In order of problems listed above:    Shared Decision Making/Informed Consent   {Are you ordering a CV Procedure (e.g. stress test, cath, DCCV, TEE, etc)?   Press F2        :[id number]}     Medication Adjustments/Labs and Tests Ordered: Current medicines are reviewed at length with the patient today.  Concerns regarding medicines are outlined above.  Medication changes, Labs and Tests ordered today are listed in the Patient Instructions below. There are no Patient Instructions on file for this visit.   Signed, Ermalinda Barrios, PA-C  10/15/2020 7:10 AM    [facility] [place], [place], [place]  [address] Phone: [phone]; Fax: [phone]

## 2020-10-15 NOTE — Consult Note (Addendum)
[place] Nurse Consult Note: Patient receiving care in [place] Reason for Consult: RLE wounds Wound type: 2 full thickness ulcers on the right lateral lower extremity that are yellow with small amount of drainage. Chronic venous disease. Patient has been going to the [facility] with last visit on 09/18/20 where they released him. Patient has been wearing compression on the right leg and compression sock on the left leg.  Pressure Injury POA: NA Periwound: Intact with hemosiderin staining Dressing procedure/placement/frequency: Apply a small piece of Aquacel Advantage over the 2 open wounds, secure with foam dressing. Wrap the right leg with Kerlix and Coban. Change on Tu, Th, Sa. Use NS to loosen the Aquacel when removing if stuck to the wound.     Monitor the wound area(s) for worsening of condition such as: Signs/symptoms of infection, increase in size, development of or worsening of odor, development of pain, or increased pain at the affected locations.   Notify the medical team if any of these develop.  Pressure Injury Prevention Bundle May use any that apply to this patient. Support surfaces (air mattress) chair cushion Kellie Simmering # [phone]) Heel offloading boots Kellie Simmering # [phone]) Turning and Positioning  Measures to reduce shear (draw sheet, knees up) Skin protection Products (Foam dressing) Moisture management products (Critic-Aid Barrier Cream (Purple top) Sween moisturizing lotion (Pink top in clean supply) Nutrition Management Protection for Medical Devices Routine Skin Assessment   Thank you for the consult. [place] nurse will not follow at this time.   Please re-consult the [place] team if needed.  Cathlean Marseilles Tamala Julian, MSN, RN, [place], Lysle Pearl, [facility] Wound Treatment Associate Pager [phone]

## 2020-10-15 NOTE — Evaluation (Signed)
Physical Therapy Evaluation Patient Details Name: [name] MRN: [id number] DOB: [birth year] Today's Date: 10/15/2020  History of Present Illness  The pt is an 87 yo male presenting after a fall at home resulting in pain in tailbone and hematuria. Upon work up pt found to have acute resp failure with hypoxia due to CHF exacerbation and AMS. PMH includes: afib on anticoagulation, embolic CVA, HTN, and venous stasis ulcers.   Clinical Impression  Pt in bed upon arrival of PT, agreeable to evaluation at this time. Prior to admission the pt was mobilizing short distances with 4-wheel walker in the home, but per son requires lift chair to rist to standing. The pt requires assist from son for LB dressing, but was able to complete showers and toileting without assist.  The pt now presents with limitations in functional mobility, strength, power, stability, and fear of falling due to above dx, and will continue to benefit from skilled PT to address these deficits. The pt required maxA of 2 to complete bed mobility and min-modA to maintain static sitting due to frequent posterior LOB. The pt was unable to recognize or correct LOB at this time without physical assist. The pt did complete x2 sit-stand attempts, but was unable to achieve full stand with knees and hips extended despite maxA of 2-3 and use of RW. Given recent decline resulting in increased frequency of falls and increased level of assist, recommend SNF at d/c for continued rehab prior to return home.         Recommendations for follow up therapy are one component of a multi-disciplinary discharge planning process, led by the attending physician.  Recommendations may be updated based on patient status, additional functional criteria and insurance authorization.  Follow Up Recommendations SNF;Supervision/Assistance - 24 hour    Equipment Recommendations  None recommended by PT    Recommendations for Other Services       Precautions /  Restrictions Precautions Precautions: Fall Precaution Comments: pt son reports x2 falls in last week, increased fear of falls Restrictions Weight Bearing Restrictions: No      Mobility  Bed Mobility Overal bed mobility: Needs Assistance Bed Mobility: Supine to Sit     Supine to sit: Max assist;+2 for physical assistance;+2 for safety/equipment;HOB elevated     General bed mobility comments: pt needing maxA to complete movement of either LE, maxA to trunk with use of bed pads to move to EOB. pt with frequent LOB backwards sitting EOB with pt unable to recognize LOB or initiate postural changes    Transfers Overall transfer level: Needs assistance Equipment used: Rolling walker (2 wheeled) Transfers: Sit to/from Stand Sit to Stand: Max assist;+2 physical assistance;+2 safety/equipment;From elevated surface         General transfer comment: max of 2-3 (son assisting on 2nd attempt). Pt unable to achieve knee extension or hip extension. poor functional power through BLE  Ambulation/Gait             General Gait Details: pt unable to generate any steps at this time     Balance Overall balance assessment: Needs assistance;History of Falls Sitting-balance support: Bilateral upper extremity supported;Feet supported Sitting balance-Leahy Scale: Poor Sitting balance - Comments: pt intermittently falling backwards, mod-maxA to correct with minG-minA to maintain static sitting. pt unable to initiate correction to LOB Postural control: Posterior lean Standing balance support: Bilateral upper extremity supported;During functional activity Standing balance-Leahy Scale: Zero Standing balance comment: dependent on BUE support and maxA of 2-3, still unable to  achieve full stand                             Pertinent Vitals/Pain Pain Assessment: Faces Faces Pain Scale: Hurts little more Pain Location: R hip, RLE ulcer, L shoulder Pain Descriptors / Indicators:  Aching;Discomfort;Sore Pain Intervention(s): Limited activity within patient's tolerance;Monitored during session;Repositioned    Home Living Family/patient expects to be discharged to:: Private residence Living Arrangements: Spouse/significant other;Children (son lives behind them) Available Help at Discharge: Family;Available 24 hours/day Type of Home: House Home Access: Ramped entrance     Home Layout: Multi-level (son reports x2 places in home with 1 step) Home Equipment: Walker - 4 wheels;Bedside commode;Grab bars - tub/shower;Other (comment) (lift recliner)      Prior Function Level of Independence: Needs assistance   Gait / Transfers Assistance Needed: pt walks short distances with rollator in the home, son reports he walks sometimes wihtout AD  ADL's / Homemaking Assistance Needed: son reports the pt needs assist with dressing LB, but sometimes "does it before I get there" in the mornings. Son also provides supervision, but states the pt showers in walk in shower without seat. per son, pt toilets independently  Comments: pt son inquiring about lift toilet seat     Hand Dominance   Dominant Hand: Right    Extremity/Trunk Assessment   Upper Extremity Assessment Upper Extremity Assessment: Defer to OT evaluation    Lower Extremity Assessment Lower Extremity Assessment: Generalized weakness;RLE deficits/detail;LLE deficits/detail RLE Deficits / Details: leg wrapped, prt son pt with ulcer on RLE lateral aspect. pt reports painful to touch, unable to complete full AROM against gravity due to pain and weakness, grossly 3+/5 RLE: Unable to fully assess due to pain LLE Deficits / Details: pt with swelling and pitting edema below knee, grossly 4-/5 with limited knee extension    Cervical / Trunk Assessment Cervical / Trunk Assessment: Normal  Communication   Communication: No difficulties  Cognition Arousal/Alertness: Awake/alert Behavior During Therapy: WFL for tasks  assessed/performed Overall Cognitive Status: Impaired/Different from baseline Area of Impairment: Following commands;Awareness;Safety/judgement;Problem solving                       Following Commands: Follows one step commands with increased time Safety/Judgement: Decreased awareness of safety;Decreased awareness of deficits Awareness: Intellectual Problem Solving: Slow processing;Decreased initiation;Difficulty sequencing;Requires verbal cues General Comments: pt needing verbal cues and increased time following all instructions. limited by fear of falling and then unable to make necessary adjustments with verbal or tactile cues. decreased insight to condition, need for tioleting, and unable to voice needs or direct care      General Comments General comments (skin integrity, edema, etc.): HR to 120s with transition to sitting EOB, 144 with stand. continues with pitting edema below knees, son reports swelling is better. difficulty obtaining SpO2 reading with reliable pleth, from 82-92% with pt put on 2L O2 to maintain in 90s        Assessment/Plan    PT Assessment Patient needs continued PT services  PT Problem List Decreased strength;Decreased range of motion;Decreased balance;Decreased activity tolerance;Decreased mobility;Decreased coordination;Decreased cognition;Decreased safety awareness;Decreased knowledge of use of DME;Decreased knowledge of precautions;Pain       PT Treatment Interventions Gait training;DME instruction;Functional mobility training;Stair training;Therapeutic activities;Therapeutic exercise;Balance training;Patient/family education    PT Goals (Current goals can be found in the Care Plan section)  Acute Rehab PT Goals Patient Stated Goal: return home PT Goal  Formulation: With patient Time For Goal Achievement: 10/29/20 Potential to Achieve Goals: Fair    Frequency Min 2X/week   Barriers to discharge Decreased caregiver support pt currently  requires maxA of 2-3    Co-evaluation PT/OT/SLP Co-Evaluation/Treatment: Yes Reason for Co-Treatment: Necessary to address cognition/behavior during functional activity;For patient/therapist safety;To address functional/ADL transfers PT goals addressed during session: Mobility/safety with mobility;Balance;Strengthening/ROM         AM-PAC PT "6 Clicks" Mobility  Outcome Measure Help needed turning from your back to your side while in a flat bed without using bedrails?: A Lot Help needed moving from lying on your back to sitting on the side of a flat bed without using bedrails?: A Lot Help needed moving to and from a bed to a chair (including a wheelchair)?: Total Help needed standing up from a chair using your arms (e.g., wheelchair or bedside chair)?: A Lot Help needed to walk in hospital room?: Total Help needed climbing 3-5 steps with a railing? : Total 6 Click Score: 9    End of Session Equipment Utilized During Treatment: Gait belt;Oxygen Activity Tolerance: Patient limited by fatigue Patient left: in chair;with call bell/phone within reach;with chair alarm set;with family/visitor present Nurse Communication: Mobility status;Need for lift equipment PT Visit Diagnosis: Other abnormalities of gait and mobility (R26.89);Repeated falls (R29.6);Muscle weakness (generalized) (M62.81)    Time: 7782-4235 PT Time Calculation (min) (ACUTE ONLY): 37 min   Charges:   PT Evaluation $PT Eval Moderate Complexity: 1 Mod          [place], PT, DPT   Acute Rehabilitation Department Pager #: [phone]  Sandra Cockayne 10/15/2020, 9:41 AM

## 2020-10-15 NOTE — Progress Notes (Signed)
EEG complete - results pending 

## 2020-10-15 NOTE — Progress Notes (Signed)
ANTICOAGULATION CONSULT NOTE   Pharmacy Consult for Coumadin Indication: atrial fibrillation  No Known Allergies  Patient Measurements: Weight: 97.9 kg (215 lb 13.3 oz) Heparin Dosing Weight: n/a  Vital Signs: Temp: 97.9 F (36.6 C) (10/06 0422) Temp Source: Axillary (10/06 0422) BP: 150/85 (10/06 0422) Pulse Rate: 97 (10/06 0422)  Labs: Recent Labs    10/13/20 1607 10/14/20 0716 10/14/20 1426 10/14/20 1437 10/15/20 0129  HGB 13.6  --   --  15.6 13.6  HCT 40.6  --   --  46.0 40.9  PLT PLATELET CLUMPS NOTED ON SMEAR, UNABLE TO ESTIMATE  --   --   --  PLATELET CLUMPS NOTED ON SMEAR, UNABLE TO ESTIMATE  LABPROT  --  26.7*  --   --  26.1*  INR  --  2.5*  --   --  2.4*  CREATININE 0.93  --   --   --  0.86  CKTOTAL  --   --  413*  --   --     Estimated Creatinine Clearance: 69.7 mL/min (by C-G formula based on SCr of 0.86 mg/dL).    Assessment: 54 yom on warfarin PTA for afib presenting s/p fall. Imaging negative for bleed. Pharmacy consulted to dose warfarin inpatient. INR therapeutic at 2.5 on admit. Hg wnl, plt clumped. No bleed issues reported.   PTA warfarin dose: 5mg  daily except 7.5mg  on Mon (last dose 10/4 PTA)  Goal of Therapy:  INR 2-3 Monitor platelets by anticoagulation protocol: Yes   Plan:  Coumadin 5 mg po x 1 again tonight. Daily INR.  [place] Crane, Roylene Reason, BCCP Clinical Pharmacist  10/15/2020 10:26 AM   [facility] pharmacy phone numbers are listed on [email or web address]

## 2020-10-15 NOTE — Progress Notes (Signed)
  Echocardiogram 2D Echocardiogram has been performed.  Merrie Roof F 10/15/2020, 3:11 PM

## 2020-10-15 NOTE — Evaluation (Signed)
Occupational Therapy Evaluation Patient Details Name: [name] MRN: [id number] DOB: 1930-09-26 Today's Date: 10/15/2020   History of Present Illness The pt is an 87 yo male presenting after a fall at home resulting in pain in tailbone and hematuria. Upon work up pt found to have acute resp failure with hypoxia due to CHF exacerbation and AMS. PMH includes: afib on anticoagulation, embolic CVA, HTN, and venous stasis ulcers.   Clinical Impression   PTA, pt lives with family and typically ambulatory short distances with Rollator. Pt typically able to toilet, bathe and dress self but may require family assist at times. Pt presents now significantly below baseline and limited by R LE/L UE soreness from fall. Pt also with deficits in sitting/standing balance, cognition, cardiopulmonary endurance, ROM, and strength. Pt overall Max A x 2 for bed mobility and sit to stand attempts with RW. Pt unable to successfully take steps this AM . Pt requires Mod A for UB ADLs and up to Total A x 2 for LB ADLs (if completed in standing). Pt's son present and assisting during session; agreeable for SNF rehab prior to return home. Will continue to follow acutely to progress balance and postural control during ADLs/transfer training.      Recommendations for follow up therapy are one component of a multi-disciplinary discharge planning process, led by the attending physician.  Recommendations may be updated based on patient status, additional functional criteria and insurance authorization.   Follow Up Recommendations  SNF;Supervision/Assistance - 24 hour    Equipment Recommendations  Other (comment) (to be determined pending progress)    Recommendations for Other Services       Precautions / Restrictions Precautions Precautions: Fall Precaution Comments: pt son reports x2 falls in last week, increased fear of falls; monitor O2 (does not wear at baseline) Restrictions Weight Bearing Restrictions: No       Mobility Bed Mobility Overal bed mobility: Needs Assistance Bed Mobility: Supine to Sit     Supine to sit: Max assist;+2 for physical assistance;+2 for safety/equipment;HOB elevated     General bed mobility comments: pt needing maxA to complete movement of either LE, maxA to trunk with use of bed pads to move to EOB. pt with frequent LOB backwards sitting EOB with pt unable to recognize LOB or initiate postural changes    Transfers Overall transfer level: Needs assistance Equipment used: Rolling walker (2 wheeled) Transfers: Sit to/from Stand Sit to Stand: Max assist;+2 physical assistance;+2 safety/equipment;From elevated surface         General transfer comment: max of 2-3 (son assisting on 2nd attempt). Pt unable to achieve knee extension or hip extension. poor functional power through BLE    Balance Overall balance assessment: Needs assistance;History of Falls Sitting-balance support: Bilateral upper extremity supported;Feet supported Sitting balance-Leahy Scale: Poor Sitting balance - Comments: pt intermittently falling backwards, mod-maxA to correct with minG-minA to maintain static sitting. pt unable to initiate correction to LOB Postural control: Posterior lean Standing balance support: Bilateral upper extremity supported;During functional activity Standing balance-Leahy Scale: Zero Standing balance comment: dependent on BUE support and maxA of 2-3, still unable to achieve full stand                           ADL either performed or assessed with clinical judgement   ADL Overall ADL's : Needs assistance/impaired Eating/Feeding: Set up;Sitting   Grooming: Minimal assistance;Sitting   Upper Body Bathing: Moderate assistance;Sitting   Lower Body Bathing:  Total assistance;Sit to/from stand;+2 for physical assistance;+2 for safety/equipment Lower Body Bathing Details (indicate cue type and reason): Total A x 2 for bathing peri region after condom cath  malfunction. 1 person to assist in maintaining balance and 1 person to assist with hygiene Upper Body Dressing : Moderate assistance;Sitting   Lower Body Dressing: Total assistance;+2 for physical assistance;+2 for safety/equipment;Sit to/from stand Lower Body Dressing Details (indicate cue type and reason): Total A to don socks     Toileting- Clothing Manipulation and Hygiene: Total assistance;+2 for physical assistance;+2 for safety/equipment;Sit to/from stand         General ADL Comments: Pt limited by LE and UE soreness from fall, difficulty following directions at times and fear of falling requiring increased assist to maintain standing. Pt unable to demo ability to lift feet for transfers today     Vision Baseline Vision/History: 1 Wears glasses Ability to See in Adequate Light: 1 Impaired Patient Visual Report: No change from baseline Vision Assessment?: No apparent visual deficits     Perception     Praxis      Pertinent Vitals/Pain Pain Assessment: Faces Faces Pain Scale: Hurts little more Pain Location: R hip, RLE ulcer, L shoulder Pain Descriptors / Indicators: Aching;Discomfort;Sore Pain Intervention(s): Monitored during session;Limited activity within patient's tolerance;Repositioned     Hand Dominance Right   Extremity/Trunk Assessment Upper Extremity Assessment Upper Extremity Assessment: Generalized weakness;RUE deficits/detail;LUE deficits/detail RUE Deficits / Details: Able to lift UE to about 95*, able to touch top of head RUE Coordination: decreased fine motor;decreased gross motor LUE Deficits / Details: L UE sore from fall. unable to lift UE more than 35*. can touch mouth but not above nose due to pain LUE Coordination: decreased fine motor;decreased gross motor   Lower Extremity Assessment Lower Extremity Assessment: Defer to PT evaluation RLE Deficits / Details: leg wrapped, prt son pt with ulcer on RLE lateral aspect. pt reports painful to touch,  unable to complete full AROM against gravity due to pain and weakness, grossly 3+/5 RLE: Unable to fully assess due to pain LLE Deficits / Details: pt with swelling and pitting edema below knee, grossly 4-/5 with limited knee extension   Cervical / Trunk Assessment Cervical / Trunk Assessment: Kyphotic   Communication Communication Communication: No difficulties   Cognition Arousal/Alertness: Awake/alert Behavior During Therapy: WFL for tasks assessed/performed Overall Cognitive Status: Impaired/Different from baseline Area of Impairment: Following commands;Awareness;Safety/judgement;Problem solving                       Following Commands: Follows one step commands with increased time Safety/Judgement: Decreased awareness of safety;Decreased awareness of deficits Awareness: Intellectual Problem Solving: Slow processing;Decreased initiation;Difficulty sequencing;Requires verbal cues General Comments: pt needing verbal cues and increased time following all instructions. limited by fear of falling and then unable to make necessary adjustments with verbal or tactile cues. decreased insight to condition, need for toileting, and unable to voice needs or direct care consistently   General Comments  HR to 120s with transition to sitting EOB, 144 with stand. continues with pitting edema below knees, son reports swelling is better. difficulty obtaining SpO2 reading with reliable pleth, from 82-92% with pt put on 2L O2 to maintain in 90s    Exercises     Shoulder Instructions      Home Living Family/patient expects to be discharged to:: Private residence Living Arrangements: Spouse/significant other;Children (son lives behind them) Available Help at Discharge: Family;Available 24 hours/day Type of Home: [place]  Access: Ramped entrance     Home Layout: Multi-level (son reports x2 places in home with 1 step) Alternate Level Stairs-Number of Steps: 1 Alternate Level  Stairs-Rails: None Bathroom Shower/Tub: Occupational psychologist: Handicapped height     Home Equipment: Environmental consultant - 4 wheels;Bedside commode;Grab bars - tub/shower;Other (comment) (lift recliner)          Prior Functioning/Environment Level of Independence: Needs assistance  Gait / Transfers Assistance Needed: pt walks short distances with rollator in the home, son reports he walks sometimes without AD ADL's / Homemaking Assistance Needed: son reports the pt needs assist with dressing LB, but sometimes "does it before I get there" in the mornings. Son also provides supervision, but states the pt showers in walk in shower without seat. per son, pt toilets independently   Comments: pt son inquiring about lift toilet seat        OT Problem List: Decreased strength;Decreased activity tolerance;Decreased range of motion;Impaired balance (sitting and/or standing);Decreased coordination;Decreased cognition;Cardiopulmonary status limiting activity;Decreased knowledge of precautions;Impaired UE functional use;Pain      OT Treatment/Interventions: Self-care/ADL training;Therapeutic exercise;Energy conservation;DME and/or AE instruction;Therapeutic activities;Balance training;Patient/family education    OT Goals(Current goals can be found in the care plan section) Acute Rehab OT Goals Patient Stated Goal: return home OT Goal Formulation: With patient/family Time For Goal Achievement: 10/29/20 Potential to Achieve Goals: Fair ADL Goals Pt Will Perform Lower Body Dressing: with min assist;sit to/from stand Pt Will Transfer to Toilet: with min guard assist;stand pivot transfer;bedside commode Pt Will Perform Toileting - Clothing Manipulation and hygiene: with min assist;sit to/from stand Additional ADL Goal #1: Pt to complete bed mobility at Min A in prep for ADLs  OT Frequency: Min 2X/week   Barriers to D/C:            Co-evaluation PT/OT/SLP Co-Evaluation/Treatment: Yes Reason  for Co-Treatment: For patient/therapist safety;To address functional/ADL transfers;Necessary to address cognition/behavior during functional activity PT goals addressed during session: Mobility/safety with mobility;Balance;Strengthening/ROM OT goals addressed during session: ADL's and self-care      AM-PAC OT "6 Clicks" Daily Activity     Outcome Measure Help from another person eating meals?: A Little Help from another person taking care of personal grooming?: A Little Help from another person toileting, which includes using toliet, bedpan, or urinal?: Total Help from another person bathing (including washing, rinsing, drying)?: A Lot Help from another person to put on and taking off regular upper body clothing?: A Lot Help from another person to put on and taking off regular lower body clothing?: Total 6 Click Score: 12   End of Session Equipment Utilized During Treatment: Gait belt;Rolling walker;Oxygen Nurse Communication: Mobility status  Activity Tolerance: Patient tolerated treatment well Patient left: in chair;with call bell/phone within reach;with chair alarm set;with family/visitor present  OT Visit Diagnosis: Unsteadiness on feet (R26.81);Other abnormalities of gait and mobility (R26.89);Muscle weakness (generalized) (M62.81);History of falling (Z91.81);Pain Pain - Right/Left: Left Pain - part of body: Shoulder (R LE)                Time: 6384-5364 OT Time Calculation (min): 35 min Charges:  OT General Charges $OT Visit: 1 Visit OT Evaluation $OT Eval Moderate Complexity: 1 Mod  Malachy Chamber, OTR/L Acute Rehab Services Office: [phone]   Layla Maw 10/15/2020, 10:14 AM

## 2020-10-15 NOTE — Consult Note (Signed)
Cardiology Consultation:   Patient ID: [name] MRN: [id number]; DOB: [birth year]  Admit date: 10/13/2020 Date of Consult: 10/15/2020  PCP:  Gaynelle Arabian, MD   [facility] Providers Cardiologist:  Sinclair Grooms, MD   {   Patient Profile:   [name] is a 87 y.o. male with a PMH of permanent atrial fibrillation on Coumadin, embolic CVA, hypertension, hyperlipidemia, BPH, memory loss, chronic venous stasis ulcers, who is being seen 10/15/2020 for the evaluation of CHF at the request of Dr. Tamala Julian.  History of Present Illness:   [name] follows Dr. Tamala Julian outpatient, has long standing hx of  permanent atrial fibrillation, initially diagnosed in 04/2006 when he suffered left posterior cerebral artery embolic infarct. He is managed on metoprolol and Coumadin.  He was last seen in the office on 01/24/2019, rate control was borderline, metoprolol XL was increased from 50 to 75 mg daily.   He underwent left heart catheterization 05/04/2006 which showed no significant obstructive CAD, minimal plaquing, normal LVEF, and mild ectasia in the proximal left anterior descending. Echo from 05/03/2006 with EF 65% and mild AR.   He presented to ER on 10/13/20 for unwitnessed fall, left shoulder/pelvic/ right leg/low back pain, blood in stool x1, dark urine, altered mental status, hallucination, and generalized weakness. Patient is sleepy during encounter, unable to provide good history. He knows he is in the hospital and oriented to person. He is able to follow one step commands. He can't elaborate history. He denied any active pain at this time. His wife is at bedside, states patient has been getting intermittent SOB at rest and with exertion, heart palpitation, irregular heartbeat sensation, brief chest pain lasting few seconds at home over the past 3-4 weeks. He also suffers BLE swelling for nearly 9 months, has leg ulcers that are treated by wound care center. He suffered a fall at home  yesterday, was noted much more weaker than usual, was also confused and not knowing where he is. He has mild memory loss without gross confusion and disorientation at baseline. His urine appeared much darker and had some blood in his stool. Wife felt patient is more oriented today, BLE swelling are improving as well. Wife states patient gained >10 pounds over the past few weeks.   Admission diagnostics noted acute hyponatremia 127, hypochloremia 88, total bilirubin 1.5, CBC differential unremarkable, INR 2.5. BNP 141. RPR/COVID-19/Flu negative.  Urinalysis + RBC/Hgb and protein. EKG with A fib and nonspecific ST-T abnormalities.  CT head /cervical spine / lumbar spine revealed no acute traumatic finding. CT pelvis revealed superficial soft tissue contusion/hematoma overlying the posterior lateral right hip.  CXR revealed interstitial edema with low lung volume, left humeral head concerning for fracture versus arthropathy. Subsequent left shoulder x-ray revealed no fracture. He is afebrile, tachycardic 90-110s, hypertensive with blood pressure up to 165/92, and hypoxic with pox 87% RA and required [facility] since admission. He is currently admitted to hospital medicine service for acute metabolic encephalopathy, fall.  He was concerned for acute diastolic heart failure, started on IV Lasix 40 mg twice daily, cardiology is consulted for further evaluation.   Past Medical History:  Diagnosis Date   Atrial fibrillation ([place]) 10/15/2012   Cancer ([place])    Skin (R hand)   Embolic stroke ([place]) 07/10/1599   Essential hypertension 01/21/2014   Long term current use of anticoagulant therapy 12/25/2012    Past Surgical History:  Procedure Laterality Date   APPENDECTOMY     CARDIAC CATHETERIZATION  CYSTOSCOPY WITH INSERTION OF UROLIFT N/A 10/10/2017   Procedure: CYSTOSCOPY WITH INSERTION OF UROLIFT;  Surgeon: [name], MD;  Location: [facility];  Service: Urology;  Laterality: N/A;  NEEDS 60 MIN   EYE  SURGERY Bilateral 2013   Cataracts     Home Medications:  Prior to Admission medications   Medication Sig Start Date End Date Taking? Authorizing Provider  acetaminophen (TYLENOL) 500 MG tablet Take 500-1,000 mg by mouth every 6 (six) hours as needed (FOR PAIN.).   Yes [provider]  albuterol (VENTOLIN HFA) 108 (90 Base) MCG/ACT inhaler Inhale 1 puff into the lungs every 4 (four) hours as needed for wheezing or shortness of breath. 07/02/20  Yes [provider]  aspirin EC 81 MG tablet Take 81 mg by mouth at bedtime.   Yes [provider]  calcium carbonate (OS-CAL) 600 MG TABS tablet Take 600 mg by mouth daily.    Yes [provider]  Glucosamine HCl (GLUCOSAMINE PO) Take 1,500 mg by mouth daily.    Yes [provider]  hydrochlorothiazide (HYDRODIURIL) 12.5 MG tablet Take 1 tablet (12.5 mg total) by mouth daily. 07/03/17  Yes Belva Crome, MD  lisinopril (PRINIVIL,ZESTRIL) 10 MG tablet Take 10 mg by mouth daily.   Yes [provider]  metoprolol succinate (TOPROL-XL) 25 MG 24 hr tablet TAKE 1 TABLET BY MOUTH DAILY WITH 50 MG TABLET FOR TOTAL DAILY DOSE OF 75 MG. TAKE WITH OR IMMEDIATELY FOLLOWING A MEAL. Please make overdue appt for future refills Thank you Patient taking differently: Take 25 mg by mouth daily. Along with 50 mg tab 09/28/20  Yes Belva Crome, MD  metoprolol succinate (TOPROL-XL) 50 MG 24 hr tablet Take 1 tablet (50 mg total) by mouth daily. Along with a 25mg  tablet for a total of 75mg . Take with or immediately following a meal. Patient taking differently: Take 50 mg by mouth daily. Along with 25 mg tab 01/24/19  Yes Belva Crome, MD  Multiple Vitamin (MULTIVITAMIN) tablet Take 1 tablet by mouth daily.   Yes [provider]  Multiple Vitamins-Minerals (PRESERVISION AREDS 2) CAPS Take 1 capsule by mouth 2 (two) times daily.   Yes [provider]  traMADol (ULTRAM) 50 MG tablet Take 1 tablet (50 mg total)  by mouth every 6 (six) hours as needed. Patient taking differently: Take 50 mg by mouth every 6 (six) hours as needed for moderate pain. 08/31/20  Yes Fisher, Linden Dolin, PA-C  warfarin (COUMADIN) 5 MG tablet TAKE AS DIRECTED PER ANTICOAGULATION CLINIC Patient taking differently: Take 5-7.5 mg by mouth See admin instructions. 7.5 mg on Sunday 5 mg Monday,Tuesday,Wednesday,Thursday,Friday,saturday 09/15/20  Yes Bhagat, Bhavinkumar, PA  lidocaine (LIDODERM) 5 % Place 1 patch onto the skin every 12 (twelve) hours. Remove & Discard patch within 12 hours or as directed by MD Patient not taking: No sig reported 08/31/20 08/31/21  Versie Starks, PA-C    Inpatient Medications: Scheduled Meds:  furosemide  40 mg Intravenous BID   [START ON 10/16/2020] influenza vaccine adjuvanted  0.5 mL Intramuscular Tomorrow-1000   lisinopril  10 mg Oral Daily   metoprolol succinate  75 mg Oral Daily   sodium chloride flush  3 mL Intravenous Q12H   warfarin  5 mg Oral ONCE-1600   Warfarin - Pharmacist Dosing Inpatient   Does not apply q1600   Continuous Infusions:  PRN Meds: acetaminophen **OR** acetaminophen, albuterol, diclofenac Sodium, metoprolol tartrate  Allergies:   No Known Allergies  Social  History:   Social History   Socioeconomic History   Marital status: Married    Spouse name: Not on file   Number of children: Not on file   Years of education: Not on file   Highest education level: Not on file  Occupational History   Not on file  Tobacco Use   Smoking status: Former    Packs/day: 0.25    Years: 10.00    Pack years: 2.50    Types: Cigarettes   Smokeless tobacco: Never  Vaping Use   Vaping Use: Never used  Substance and Sexual Activity   Alcohol use: No    Alcohol/week: 0.0 standard drinks   Drug use: No   Sexual activity: Not on file  Other Topics Concern   Not on file  Social History Narrative   Not on file   Social Determinants of Health   Financial Resource Strain: Not on  file  Food Insecurity: Not on file  Transportation Needs: Not on file  Physical Activity: Not on file  Stress: Not on file  Social Connections: Not on file  Intimate Partner Violence: Not on file    Family History:    Family History  Problem Relation Age of Onset   Other Mother        Brain hemmorrhage   Other Father        unknown     ROS:  Unable to obtain as patient is lethargic at this time  Physical Exam/Data:   Vitals:   10/15/20 0422 10/15/20 1038 10/15/20 1130 10/15/20 1231  BP: (!) 150/85 (!) 152/68  (!) 141/88  Pulse: 97 (!) 111 (!) 110 (!) 111  Resp: 20   20  Temp: 97.9 F (36.6 C)   98.4 F (36.9 C)  TempSrc: Axillary   Oral  SpO2: 100%   95%  Weight: 97.9 kg       Intake/Output Summary (Last 24 hours) at 10/15/2020 1345 Last data filed at 10/15/2020 1233 Gross per 24 hour  Intake 360 ml  Output 1375 ml  Net -1015 ml   Last 3 Weights 10/15/2020 08/31/2020 01/24/2019  Weight (lbs) 215 lb 13.3 oz 200 lb 216 lb  Weight (kg) 97.9 kg 90.719 kg 97.977 kg     Body mass index is 30.97 kg/m.   Vitals:  Vitals:   10/15/20 1130 10/15/20 1231  BP:  (!) 141/88  Pulse: (!) 110 (!) 111  Resp:  20  Temp:  98.4 F (36.9 C)  SpO2:  95%   General Appearance: In no apparent distress, laying in bed, lethargic  HEENT: Normocephalic, atraumatic.  Neck: Supple, trachea midline, JVD 8-9cm  Cardiovascular: Irregularly irregular, normal S1-S2,  no murmur/rub/gallop Respiratory: Resting breathing unlabored, lungs sounds clear but diminished at base to auscultation bilaterally, no use of accessory muscles. On 2LNC Gastrointestinal: Bowel sounds positive, abdomen soft, non-tender, non-distended.  Extremities: Able to move all extremities in bed , BLE 1+ edema, RLE with compression warp in place/unable see wound  Genitourinary: Foley with yellow urine Musculoskeletal: Normal muscle bulk and tone, global weakness  Skin: Intact, warm, dry. No rashes or petechiae noted in  exposed areas.  Neurologic: Lethargic/sleepy, open eyes to loud voice, fall asleep quickly, oriented to person, place. Fluent speech, mild memory deficit, poor historian, able to answer yes and no question, able to follow one step commands  Psychiatric: Normal affect. Mood is appropriate.    EKG:  The EKG was personally reviewed and demonstrates:    EKG from  10/14/2020 at 10:40 showed atrial fibrillation with ventricular rate of 96, LVH, nonspecific ST-T abnormality, no acute changes.   Telemetry:  Telemetry was personally reviewed and demonstrates:    A fib with RVR 90-160s noted over the past 24 hours    Relevant CV Studies:  Echocardiogram from 05/03/2006:   -  Overall left ventricular systolic function was normal. Left         ventricular ejection fraction was estimated to be 65 %. There         were no left ventricular regional wall motion abnormalities.         Left ventricular wall thickness was at the upper limits of         normal.   -  Aortic valve thickness was mildly to moderately increased. The         aortic valve was mildly calcified. There was mild aortic         valvular regurgitation.   -  There was mild mitral valvular regurgitation.    Left heart cath on 05/04/2006:   CONCLUSIONS:  1. Widely patent coronaries without any evidence of significant      obstructive disease.  Minimal plaquing is noted.  2. Normal left ventricular function.  3. Mild ectasia in the proximal left anterior descending.    PLAN:  1. The patient's medication regimen should be changed to include beta      blocker therapy to slow rate if he develops recurrent atrial      fibrillation.  2. His blood pressure regimen should contain ACE inhibitor, ARB      therapy, which may have some protective effect against      redevelopment of atrial fibrillation.  3. The patient's risk for systemic embolization is relatively low; and      after this one brief episode of atrial fibrillation, I  believe,      aspirin therapy is reasonable.  If he has recurrent atrial      fibrillation, Coumadin should be started.    Laboratory Data:  High Sensitivity Troponin:  No results for input(s): TROPONINIHS in the last 720 hours.   Chemistry Recent Labs  Lab 10/13/20 1607 10/14/20 1437 10/15/20 0129  NA 127* 130* 125*  K 3.7 4.0 3.7  CL 88*  --  88*  CO2 29  --  29  GLUCOSE 110*  --  111*  BUN 11  --  11  CREATININE 0.93  --  0.86  CALCIUM 8.9  --  8.7*  GFRNONAA >60  --  >60  ANIONGAP 10  --  8    Recent Labs  Lab 10/14/20 0716 10/15/20 0129  PROT <3.0* 7.4  ALBUMIN 3.5 3.2*  AST 43* 41  ALT 27 24  ALKPHOS 54 56  BILITOT 1.5* 1.8*   Lipids No results for input(s): CHOL, TRIG, HDL, LABVLDL, LDLCALC, CHOLHDL in the last 168 hours.  Hematology Recent Labs  Lab 10/13/20 1607 10/14/20 1437 10/15/20 0129  WBC 8.9  --  8.7  RBC 4.63  --  4.73  HGB 13.6 15.6 13.6  HCT 40.6 46.0 40.9  MCV 87.7  --  86.5  MCH 29.4  --  28.8  MCHC 33.5  --  33.3  RDW 13.8  --  13.5  PLT PLATELET CLUMPS NOTED ON SMEAR, UNABLE TO ESTIMATE  --  PLATELET CLUMPS NOTED ON SMEAR, UNABLE TO ESTIMATE   Thyroid  Recent Labs  Lab 10/14/20 1426  TSH 0.991  BNP Recent Labs  Lab 10/14/20 0928  BNP 141.1*    DDimer No results for input(s): DDIMER in the last 168 hours.   Radiology/Studies:  DG Chest 2 View  Result Date: 10/14/2020 CLINICAL DATA:  Syncope ,feeling weak,fell down , some sobfall/syncope EXAM: CHEST - 2 VIEW COMPARISON:  Chest x-ray 2011, shoulder radiograph 08/31/2020 FINDINGS: Stable cardiac silhouette. There is linear interstitial thickening in the lungs. Chronic elevation RIGHT hemidiaphragm. No focal consolidation. No pneumothorax. Irregular margin of the LEFT humeral head. IMPRESSION: 1. Interstitial edema pattern. 2. Low lung volumes. 3. LEFT humeral head fracture versus severe arthropathy. Consider dedicated view of the LEFT shoulder Electronically Signed   By:  Suzy Bouchard M.D.   On: 10/14/2020 08:40   DG Pelvis 1-2 Views  Result Date: 10/13/2020 CLINICAL DATA:  Tailbone pain EXAM: PELVIS - 1-2 VIEW COMPARISON:  06/09/2015 FINDINGS: SI joints are non widened. The pubic symphysis and rami are intact. Right hip replacement with intact hardware and normal alignment. Vascular calcifications. There are mild degenerative changes of the left hip. IMPRESSION: Right hip replacement without acute osseous abnormality. Electronically Signed   By: Donavan Foil M.D.   On: 10/13/2020 16:51   CT HEAD WO CONTRAST (5MM)  Result Date: 10/14/2020 CLINICAL DATA:  87 year old male with history of trauma from a fall at home yesterday. EXAM: CT HEAD WITHOUT CONTRAST CT CERVICAL SPINE WITHOUT CONTRAST TECHNIQUE: Multidetector CT imaging of the head and cervical spine was performed following the standard protocol without intravenous contrast. Multiplanar CT image reconstructions of the cervical spine were also generated. COMPARISON:  Head CT 11/13/2015.  Cervical spine CT 10/21/2015. FINDINGS: CT HEAD FINDINGS Brain: Moderate cerebral atrophy with ex vacuo dilatation of the ventricular system. Patchy and confluent areas of decreased attenuation are noted throughout the deep and periventricular white matter of the cerebral hemispheres bilaterally, compatible with chronic microvascular ischemic disease. Well-defined areas of low attenuation in the left occipital lobe posteromedially, similar to the prior study, compatible with an area of encephalomalacia from remote left PCA territory infarct. Cavum septum pellucidum (normal anatomical variant). No evidence of acute infarction, hemorrhage, hydrocephalus, extra-axial collection or mass lesion/mass effect. Vascular: No hyperdense vessel or unexpected calcification. Skull: Normal. Negative for fracture or focal lesion. Sinuses/Orbits: No acute finding. Other: None. CT CERVICAL SPINE FINDINGS Alignment: Normal. Skull base and vertebrae: No  acute fracture. No primary bone lesion or focal pathologic process. Soft tissues and spinal canal: No prevertebral fluid or swelling. No visible canal hematoma. Disc levels: Severe multilevel degenerative disc disease, most pronounced at C4-C5, C5-C6 and. Severe multilevel facet arthropathy bilaterally. Upper chest: Areas of ground-glass attenuation and interlobular septal thickening in the visualize lungs, concerning for pulmonary edema. Other: None. IMPRESSION: 1. No evidence of significant acute traumatic injury to the skull, brain or cervical spine. 2. Moderate cerebral atrophy with extensive chronic microvascular ischemic changes and old left PCA territory infarct. 3. The appearance of the lung apices suggests pulmonary edema. Correlation with chest radiography is recommended. 4. Multilevel degenerative disc disease and cervical spondylosis, as above. Electronically Signed   By: Vinnie Langton M.D.   On: 10/14/2020 08:33   CT Cervical Spine Wo Contrast  Result Date: 10/14/2020 CLINICAL DATA:  87 year old male with history of trauma from a fall at home yesterday. EXAM: CT HEAD WITHOUT CONTRAST CT CERVICAL SPINE WITHOUT CONTRAST TECHNIQUE: Multidetector CT imaging of the head and cervical spine was performed following the standard protocol without intravenous contrast. Multiplanar CT image reconstructions of the cervical spine  were also generated. COMPARISON:  Head CT 11/13/2015.  Cervical spine CT 10/21/2015. FINDINGS: CT HEAD FINDINGS Brain: Moderate cerebral atrophy with ex vacuo dilatation of the ventricular system. Patchy and confluent areas of decreased attenuation are noted throughout the deep and periventricular white matter of the cerebral hemispheres bilaterally, compatible with chronic microvascular ischemic disease. Well-defined areas of low attenuation in the left occipital lobe posteromedially, similar to the prior study, compatible with an area of encephalomalacia from remote left PCA  territory infarct. Cavum septum pellucidum (normal anatomical variant). No evidence of acute infarction, hemorrhage, hydrocephalus, extra-axial collection or mass lesion/mass effect. Vascular: No hyperdense vessel or unexpected calcification. Skull: Normal. Negative for fracture or focal lesion. Sinuses/Orbits: No acute finding. Other: None. CT CERVICAL SPINE FINDINGS Alignment: Normal. Skull base and vertebrae: No acute fracture. No primary bone lesion or focal pathologic process. Soft tissues and spinal canal: No prevertebral fluid or swelling. No visible canal hematoma. Disc levels: Severe multilevel degenerative disc disease, most pronounced at C4-C5, C5-C6 and. Severe multilevel facet arthropathy bilaterally. Upper chest: Areas of ground-glass attenuation and interlobular septal thickening in the visualize lungs, concerning for pulmonary edema. Other: None. IMPRESSION: 1. No evidence of significant acute traumatic injury to the skull, brain or cervical spine. 2. Moderate cerebral atrophy with extensive chronic microvascular ischemic changes and old left PCA territory infarct. 3. The appearance of the lung apices suggests pulmonary edema. Correlation with chest radiography is recommended. 4. Multilevel degenerative disc disease and cervical spondylosis, as above. Electronically Signed   By: Vinnie Langton M.D.   On: 10/14/2020 08:33   CT Lumbar Spine Wo Contrast  Result Date: 10/14/2020 CLINICAL DATA:  87 year old male status post fall at home yesterday. EXAM: CT LUMBAR SPINE WITHOUT CONTRAST TECHNIQUE: Multidetector CT imaging of the lumbar spine was performed without intravenous contrast administration. Multiplanar CT image reconstructions were also generated. COMPARISON:  [facility] CT Abdomen and Pelvis 01/06/2012 FINDINGS: Segmentation: Normal. Alignment: Stable lumbar lordosis since 2013. No significant spondylolisthesis. Vertebrae: Osteopenia. Chronic degenerative S1 superior  endplate sclerosis appears stable. Stable lumbar and lower thoracic vertebral height and alignment. Visible sacrum and SI joints appear intact. Visible posterior lower ribs appear intact. No acute osseous abnormality identified. Paraspinal and other soft tissues: Aortoiliac calcified atherosclerosis. Normal caliber abdominal aorta. Stable visible abdominal viscera. Motion artifact at the left costophrenic angle. Lumbar paraspinal soft tissues are normal except for L2-L3 on the left which is detailed below. Disc levels: Widespread lumbar spine degeneration. Vacuum disc L2-L3 through L5-S1. Chronic appearing degenerative and multifactorial spinal stenosis does not appear significantly changed since 2013, severe at L4-L5. Superimposed chronic oval soft tissue mass occupying the left L2 neural foramen which is smoothly expanded. This mass is best demonstrated on series 1, image 58 and measures 15 x 28 x 13 mm (AP by transverse by CC). It was present in 2013, and is not significantly changed in size or configuration since that time (series 3, image 40 of that exam). IMPRESSION: 1.  No acute traumatic injury identified in the lumbar spine. 2. 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 This has not significantly changed since 2013. 3. Chronic lumbar spine degeneration. Severe chronic multifactorial spinal stenosis at L4-L5. 4.  Aortic Atherosclerosis (ICD10-I70.0). Electronically Signed   By: Genevie Ann M.D.   On: 10/14/2020 08:45   CT PELVIS WO CONTRAST  Result Date: 10/14/2020 CLINICAL DATA:  87 year old male status post fall yesterday. EXAM: CT PELVIS WITHOUT  CONTRAST TECHNIQUE: Multidetector CT imaging of the pelvis was performed following the standard protocol without intravenous contrast. COMPARISON:  Lumbar spine CT today. [facility] CT Abdomen and Pelvis 01/06/2012. FINDINGS: Urinary Tract: Kidneys not included. Chronic  postoperative changes to the prostate and floor of the bladder. Streak artifact from chronic right hip arthroplasty. The bladder appears stable since 2013. No hydroureter. Bowel: Nondilated visible large and small bowel. Diverticulosis of the sigmoid colon with no active inflammation identified. No free air or free fluid. Vascular/Lymphatic: Aortoiliac calcified atherosclerosis. No lymphadenopathy. Reproductive: Chronic postoperative changes to the prostate, otherwise negative. Other:  Chronic pelvic phleboliths. Musculoskeletal: Sacrum, SI joints, pelvis, left hip, proximal left femur, and pubic rami appear stable since 2013 and intact. Chronic right total hip arthroplasty with regional streak artifact and heterotopic ossification, not significantly changed since 2013. The entire right femoral component is not included on the CT images but is visible on the scout view. No acute osseous abnormality identified. However, there is some evidence of superficial subcutaneous hematoma or contusion overlying the posterolateral right hip on series 5, image 31, new compared to 2013. But no other superficial soft tissue injury is identified. IMPRESSION: 1. Superficial soft tissue contusion or hematoma overlying the posterolateral right hip. Underlying right hip arthroplasty appears intact. 2. No other acute traumatic injury identified. Electronically Signed   By: Genevie Ann M.D.   On: 10/14/2020 09:04   DG Shoulder Left  Result Date: 10/14/2020 CLINICAL DATA:  Fall, pain in left shoulder EXAM: LEFT SHOULDER - 2+ VIEW COMPARISON:  Shoulder radiographs 08/31/2020 FINDINGS: No acute fracture or dislocation is seen. Advanced degenerative changes of the glenohumeral joint are again seen. The soft tissues are unremarkable. IMPRESSION: No acute fracture or dislocation identified. Electronically Signed   By: Valetta Mole M.D.   On: 10/14/2020 09:59   EEG adult  Result Date: 10/15/2020 Lora Havens, MD     10/15/2020 11:02 AM  Patient Name: JERONIMO HELLBERG MRN: [id number] Epilepsy Attending: Lora Havens Referring Physician/Provider: Dr Fuller Plan Date: 10/15/2020 Duration: 23.41 mins Patient history: 87 year old male with altered mental status.  EEG to evaluate for seizures. Level of alertness: Awake AEDs during EEG study: None Technical aspects: This EEG study was done with scalp electrodes positioned according to the 10-20 International system of electrode placement. Electrical activity was acquired at a sampling rate of 500Hz  and reviewed with a high frequency filter of 70Hz  and a low frequency filter of 1Hz . EEG data were recorded continuously and digitally stored. Description: The posterior dominant rhythm consists of 8 Hz activity of moderate voltage (25-35 uV) seen predominantly in posterior head regions, symmetric and reactive to eye opening and eye closing. Hyperventilation and photic stimulation were not performed.   IMPRESSION: This study is within normal limits. No seizures or epileptiform discharges were seen throughout the recording. Priyanka O Yadav     Assessment and Plan:   Acute hypoxic respiratory failure - wean off oxygen as tolerated   Acute diastolic heart failure  - wife reports patient had 3-4 weeks intermittent SOB with exertion/at rest, heart palpitation/irregular HR, brief chest pain lasting seconds, and BLE edema for 9 month, as well as generalized weakness/fatigue  - BNP 141 which is not impressive, POA - CXR with concern of pulmonary edema,  POA - Echo pending, will follow  - no historical CHF diagnosis, ? tachycardia mediated CM from A fib RVR  - clinically hypervolemic today  - s/p IV Lasix 40mg  BID, UOP 1352ml over  the past 24-48 rs, Net -1L so far - trend daily weight, renal index, I&O - GDMT: continue metoprolol XL 75 mg daily, lisinopril 10mg  daily   Acute hyponatremia  - Na 127 POA, baseline WNL, downtrend to 125 today  - serum osm not sent at admission, urine osm 328 and urine  sodium 89, TSH WNL - no significant hyperglycemia, will send lipid panel/serum osm today  - clinically hypervolemic today, etiology of hyponatremia likely multifactorial with mixed clinical picture  - will stop home med HCTZ for now, continue diuresis with loop diuretic, trend BMP daily with diuresis, further management per IM   Permanent atrial fibrillation with RVR  -Rate is uncontrolled, suspect rate uncontrolled over the past month, will increase metoprolol XL 75 to 100mg  daily today  -INR therapeutic, 2.4 today, continue anticoagulation with Coumadin as he is not having overt bleeding clinically   Acute metabolic encephalopathy Mechanical falls at home Right hip contusion/hematoma Elevated CK  Microscopic hematuria Blood in stool per wife  Debility  -Managed per IM     Risk Assessment/Risk Scores:  0746}      New York Heart Association (NYHA) Functional Class NYHA Class III  CHA2DS2-VASc Score = 6  This indicates a 9.7% annual risk of stroke. The patient's score is based upon: CHF History: 1 HTN History: 1 Diabetes History: 0 Stroke History: 2 Vascular Disease History: 0 Age Score: 2 Gender Score: 0        For questions or updates, please contact [place] Please consult [email or web address] for contact info under    Signed, Margie Billet, NP  10/15/2020 1:45 PM

## 2020-10-15 NOTE — Social Work (Signed)
CSW left voice message for patient's son,Ricky to return phone call.   Thurmond Butts, MSW, LCSW Clinical Social Worker

## 2020-10-16 DIAGNOSIS — E871 Hypo-osmolality and hyponatremia: Secondary | ICD-10-CM

## 2020-10-16 DIAGNOSIS — I10 Essential (primary) hypertension: Secondary | ICD-10-CM | POA: Diagnosis not present

## 2020-10-16 DIAGNOSIS — I4891 Unspecified atrial fibrillation: Secondary | ICD-10-CM | POA: Diagnosis not present

## 2020-10-16 DIAGNOSIS — J9601 Acute respiratory failure with hypoxia: Secondary | ICD-10-CM | POA: Diagnosis not present

## 2020-10-16 LAB — BASIC METABOLIC PANEL
Anion gap: 7 (ref 5–15)
BUN: 15 mg/dL (ref 8–23)
CO2: 30 mmol/L (ref 22–32)
Calcium: 8.6 mg/dL — ABNORMAL LOW (ref 8.9–10.3)
Chloride: 85 mmol/L — ABNORMAL LOW (ref 98–111)
Creatinine, Ser: 1.01 mg/dL (ref 0.61–1.24)
GFR, Estimated: >60 mL/min (ref >60)
Glucose, Bld: 115 mg/dL — ABNORMAL HIGH (ref 70–99)
Potassium: 3.5 mmol/L (ref 3.5–5.1)
Sodium: 122 mmol/L — ABNORMAL LOW (ref 135–145)

## 2020-10-16 LAB — TROPONIN I (HIGH SENSITIVITY)
Troponin I (High Sensitivity): 17 ng/L (ref <18)
Troponin I (High Sensitivity): 17 ng/L (ref <18)

## 2020-10-16 LAB — PROTIME-INR
INR: 2.7 — ABNORMAL HIGH (ref 0.8–1.2)
Prothrombin Time: 28.6 seconds — ABNORMAL HIGH (ref 11.4–15.2)

## 2020-10-16 LAB — MAGNESIUM: Magnesium: 1.7 mg/dL (ref 1.7–2.4)

## 2020-10-16 MED ORDER — WARFARIN SODIUM 5 MG PO TABS
5.0000 mg | ORAL_TABLET | Freq: Once | ORAL | Status: AC
  Administered 2020-10-16: 5 mg via ORAL
  Filled 2020-10-16: qty 1

## 2020-10-16 NOTE — Progress Notes (Signed)
PROGRESS NOTE    [name]  DTO:[id number] DOB: 05-25-1930 DOA: 10/13/2020 PCP: Gaynelle Arabian, MD    Brief Narrative: [name] is a 87 y.o. male with medical history significant of atrial fibrillation on chronic anticoagulation, embolic CVA, and venous stasis ulcers who presents after having an unwitnessed fall yesterday morning at around 4 AM.  Patient is unable to give history at this time as he is altered and obtained from his son.  Patient was found on the floor and thought that he likely fell backwards.  His son was able to help pick him up, but the patient was complaining of left shoulder and right leg pain.  His son notes that at some point they found some blood present in his stool when he had a bowel movement, but when they wiped him they did not notice any further blood.  Upon further questioning it appears the patient is 4 days and has been more weak.  At baseline patient poor recent memory, but was more confused than normal and not oriented to place or time.  Reported that it seemed like he was hallucinating complaining of the floor moving and saying things like "want to play this game" that did not make sense.  Associated symptoms include swelling of his leg and he has been followed by wound care for venous stasis ulcers.  While in the emergency department patient fell asleep twice and had shaking of his arms that lasted briefly.  No reports of tongue biting, incontinence of urine, or fevers.  He is not on oxygen at baseline and he quit smoking several years ago.   ED Course: Upon admission into the emergency department patient was noted to be afebrile, pulse 81-1 06, respirations 16-21, blood pressures maintained, and O2 saturations as low as 87% on room air with improvement on 2 L of oxygen.  CT scan of the head and cervical spine did not note any acute fracture or intercranial abnormality.  Labs since 10/4 significant for platelet count noted, sodium 127, CO2 29, chloride 88, AST  43, total bilirubin 1.5, BNP 141.1.  Chest x-ray noted stable cardiac silhouette with concern for interstitial edema pattern.  CT scan of the pelvis was significant for soft tissues contusion or hematoma overlying the posterior lateral right hip.  Noted urinalysis was positive for small hemoglobin and greater than 50 RBCs.  Assessment & Plan:   Principal Problem:   Acute respiratory failure with hypoxia (HCC) Active Problems:   Atrial fibrillation (HCC)   Long term current use of anticoagulant therapy   Essential hypertension   Fall at home, initial encounter   Hyponatremia   Hyperbilirubinemia   Acute diastolic CHF (congestive heart failure) ([place])    #1 acute hypoxic respiratory failure secondary to acute diastolic heart failure exacerbation. Echo ejection fraction 60 to 65%.   Patient was hypoxic at 87% on room air on admission with 2+ bilateral pitting edema and chest x-ray consistent with pulmonary edema and elevated BNP. Lasix stopped by cardiology today in view of normal ejection fraction and worsening hyponatremia.  #2 acute metabolic encephalopathy likely from hypoxia secondary to #1.  Patient lives alone at home however his son lives at the back of the house and keeps an eye on him. TSH normal Ammonia not elevated EEG no seizures or epileptiform discharges normal study.  #3chronic afib on coumadin   #4 fall at home x-rays of the hip shows no acute fracture PT recommending SNF.  #5 hypervolemic hyponatremia -likely multifactorial  He was on HCTZ at home which has been stopped  Normal TSH   sodium dropped to 122 from 125 from 130 on admission, if not better will consult renal tomorrow. Avoid hypotonic fluids Repeat labs in a.m.  #6 history of essential hypertension continue current meds  #7 chronic venous stasis dermatitis with 2 open wound on the right lower extremity seen by wound care nurse.  Secure with foam dressing Wrap the right leg with Kerlix and Coban change  3 times a week.    Estimated body mass index is 31.06 kg/m as calculated from the following:   Height as of 08/31/20: 5\' 10"  (1.778 m).   Weight as of this encounter: 98.2 kg.  DVT prophylaxis: coumadin Code Status:full Family Communication: dw son Disposition Plan:  Status is: Inpatient  Remains inpatient appropriate because:IV treatments appropriate due to intensity of illness or inability to take PO  Dispo: The patient is from: Home              Anticipated d/c is to: Home              Patient currently is not medically stable to d/c.   Difficult to place patient No   Consultants:  cardio  Procedures: none Antimicrobials: None  Subjective:  He is resting in bed his son is by the bedside son stayed with him overnight he feels his dad's mentation is better  objective: Vitals:   10/16/20 0300 10/16/20 0807 10/16/20 1014 10/16/20 1343  BP: (!) 145/80 132/85 115/62 139/78  Pulse: (!) 101 100 95 (!) 107  Resp: 19 20  20   Temp:  98 F (36.7 C)  98 F (36.7 C)  TempSrc:  Oral  Oral  SpO2: 93% 92%  94%  Weight:        Intake/Output Summary (Last 24 hours) at 10/16/2020 1556 Last data filed at 10/16/2020 1300 Gross per 24 hour  Intake 240 ml  Output 1600 ml  Net -1360 ml    Filed Weights   10/15/20 0422 10/16/20 0257  Weight: 97.9 kg 98.2 kg    Examination:  General exam: Appears calm and comfortable  Respiratory system: Crackles at the bases to auscultation. Respiratory effort normal. Cardiovascular system: S1 & S2 heard, RRR. No JVD, murmurs, rubs, gallops or clicks. No pedal edema. Gastrointestinal system: Abdomen is nondistended, soft and nontender. No organomegaly or masses felt. Normal bowel sounds heard. Central nervous system: Alert and oriented. No focal neurological deficits. Extremities: 2+ bilateral pitting edema  skin: Right lower extremity to open wounds Psychiatry: Judgement and insight appear normal. Mood & affect appropriate.     Data  Reviewed: I have personally reviewed following labs and imaging studies  CBC: Recent Labs  Lab 10/13/20 1607 10/14/20 1437 10/15/20 0129  WBC 8.9  --  8.7  NEUTROABS 7.0  --   --   HGB 13.6 15.6 13.6  HCT 40.6 46.0 40.9  MCV 87.7  --  86.5  PLT PLATELET CLUMPS NOTED ON SMEAR, UNABLE TO ESTIMATE  --  PLATELET CLUMPS NOTED ON SMEAR, UNABLE TO ESTIMATE    Basic Metabolic Panel: Recent Labs  Lab 10/13/20 1607 10/14/20 1437 10/15/20 0129 10/16/20 0158  NA 127* 130* 125* 122*  K 3.7 4.0 3.7 3.5  CL 88*  --  88* 85*  CO2 29  --  29 30  GLUCOSE 110*  --  111* 115*  BUN 11  --  11 15  CREATININE 0.93  --  0.86 1.01  CALCIUM 8.9  --  8.7* 8.6*  MG  --   --   --  1.7    GFR: Estimated Creatinine Clearance: 59.4 mL/min (by C-G formula based on SCr of 1.01 mg/dL). Liver Function Tests: Recent Labs  Lab 10/14/20 0716 10/15/20 0129  AST 43* 41  ALT 27 24  ALKPHOS 54 56  BILITOT 1.5* 1.8*  PROT <3.0* 7.4  ALBUMIN 3.5 3.2*    No results for input(s): LIPASE, AMYLASE in the last 168 hours. Recent Labs  Lab 10/14/20 1426  AMMONIA 23    Coagulation Profile: Recent Labs  Lab 10/14/20 0716 10/15/20 0129 10/16/20 0437  INR 2.5* 2.4* 2.7*    Cardiac Enzymes: Recent Labs  Lab 10/14/20 1426  CKTOTAL 413*    BNP (last 3 results) No results for input(s): PROBNP in the last 8760 hours. HbA1C: No results for input(s): HGBA1C in the last 72 hours. CBG: No results for input(s): GLUCAP in the last 168 hours. Lipid Profile: Recent Labs    10/15/20 1302  CHOL 137  HDL 45  LDLCALC 75  TRIG 87  CHOLHDL 3.0   Thyroid Function Tests: Recent Labs    10/14/20 1426  TSH 0.991    Anemia Panel: No results for input(s): VITAMINB12, FOLATE, FERRITIN, TIBC, IRON, RETICCTPCT in the last 72 hours. Sepsis Labs: Recent Labs  Lab 10/14/20 1942  PROCALCITON <0.10     Recent Results (from the past 240 hour(s))  Resp Panel by RT-PCR (Flu A&B, Covid) Nasopharyngeal  Swab     Status: None   Collection Time: 10/14/20 12:42 PM   Specimen: Nasopharyngeal Swab; Nasopharyngeal(NP) swabs in vial transport medium  Result Value Ref Range Status   SARS Coronavirus 2 by RT PCR NEGATIVE NEGATIVE Final    Comment: (NOTE) SARS-CoV-2 target nucleic acids are NOT DETECTED.  The SARS-CoV-2 RNA is generally detectable in upper respiratory specimens during the acute phase of infection. The lowest concentration of SARS-CoV-2 viral copies this assay can detect is 138 copies/mL. A negative result does not preclude SARS-Cov-2 infection and should not be used as the sole basis for treatment or other patient management decisions. A negative result may occur with  improper specimen collection/handling, submission of specimen other than nasopharyngeal swab, presence of viral mutation(s) within the areas targeted by this assay, and inadequate number of viral copies(<138 copies/mL). A negative result must be combined with clinical observations, patient history, and epidemiological information. The expected result is Negative.  Fact Sheet for Patients:  EntrepreneurPulse.com.au  Fact Sheet for Healthcare Providers:  IncredibleEmployment.be  This test is no t yet approved or cleared by the [place] FDA and  has been authorized for detection and/or diagnosis of SARS-CoV-2 by FDA under an Emergency Use Authorization (EUA). This EUA will remain  in effect (meaning this test can be used) for the duration of the COVID-19 declaration under Section 564(b)(1) of the Act, 21 U.S.C.section 360bbb-3(b)(1), unless the authorization is terminated  or revoked sooner.       Influenza A by PCR NEGATIVE NEGATIVE Final   Influenza B by PCR NEGATIVE NEGATIVE Final    Comment: (NOTE) The Xpert Xpress SARS-CoV-2/FLU/RSV plus assay is intended as an aid in the diagnosis of influenza from Nasopharyngeal swab specimens and should not be used as a sole  basis for treatment. Nasal washings and aspirates are unacceptable for Xpert Xpress SARS-CoV-2/FLU/RSV testing.  Fact Sheet for Patients: EntrepreneurPulse.com.au  Fact Sheet for Healthcare Providers: IncredibleEmployment.be  This test is not yet approved or  cleared by the [place] and has been authorized for detection and/or diagnosis of SARS-CoV-2 by FDA under an Emergency Use Authorization (EUA). This EUA will remain in effect (meaning this test can be used) for the duration of the COVID-19 declaration under Section 564(b)(1) of the Act, 21 U.S.C. section 360bbb-3(b)(1), unless the authorization is terminated or revoked.  Performed at [facility], [place] [address] [place], [place] [address]   Culture, blood (routine x 2)     Status: None (Preliminary result)   Collection Time: 10/14/20  9:25 PM   Specimen: BLOOD RIGHT HAND  Result Value Ref Range Status   Specimen Description BLOOD RIGHT HAND  Final   Special Requests   Final    BOTTLES DRAWN AEROBIC AND ANAEROBIC Blood Culture adequate volume   Culture   Final    NO GROWTH 2 DAYS Performed at [facility], [place] [address] [place], [place] [address]    Report Status PENDING  Incomplete          Radiology Studies: EEG adult  Result Date: 2020/11/10 Lora Havens, MD     2020/11/10 11:02 AM Patient Name: JALANI ROMINGER MRN: [id number] Epilepsy Attending: Lora Havens Referring Physician/Provider: Dr Fuller Plan Date: 11-10-20 Duration: 23.41 mins Patient history: 87 year old male with altered mental status.  EEG to evaluate for seizures. Level of alertness: Awake AEDs during EEG study: None Technical aspects: This EEG study was done with scalp electrodes positioned according to the 10-20 International system of electrode placement. Electrical activity was acquired at a sampling rate of 500Hz  and reviewed with a high frequency filter of 70Hz  and a low frequency  filter of 1Hz . EEG data were recorded continuously and digitally stored. Description: The posterior dominant rhythm consists of 8 Hz activity of moderate voltage (25-35 uV) seen predominantly in posterior head regions, symmetric and reactive to eye opening and eye closing. Hyperventilation and photic stimulation were not performed.   IMPRESSION: This study is within normal limits. No seizures or epileptiform discharges were seen throughout the recording. Lora Havens   ECHOCARDIOGRAM COMPLETE  Result Date: 2020-11-10    ECHOCARDIOGRAM REPORT   Patient Name:   RENA SWEEDEN Date of Exam: 11-10-2020 Medical Rec #:  [id number]     Height:       70.0 in Accession #:    [id number]    Weight:       215.8 lb Date of Birth:  1930/08/20     BSA:          2.156 m Patient Age:    20 years      BP:           141/88 mmHg Patient Gender: M             HR:           111 bpm. Exam Location:  Inpatient Procedure: 2D Echo, Cardiac Doppler and Color Doppler Indications:    CHF-Acute Diastolic  History:        Patient has prior history of Echocardiogram examinations, most                 recent 01/10/2007. Risk Factors:Morbid Obesity.  Sonographer:    Merrie Roof RDCS Referring Phys: [id number] [place]  1. Left ventricular ejection fraction, by estimation, is 60 to 65%. The left ventricle has normal function. The left ventricle has no regional wall motion abnormalities. There is mild left ventricular hypertrophy. Left ventricular  diastolic function could not be evaluated.  2. Right ventricular systolic function is normal. The right ventricular size is normal. There is mildly elevated pulmonary artery systolic pressure.  3. Left atrial size was severely dilated.  4. The mitral valve is grossly normal. Mild mitral valve regurgitation.  5. The aortic valve is tricuspid. There is mild calcification of the aortic valve. There is mild thickening of the aortic valve. Aortic valve regurgitation is not visualized.  FINDINGS  Left Ventricle: Left ventricular ejection fraction, by estimation, is 60 to 65%. The left ventricle has normal function. The left ventricle has no regional wall motion abnormalities. The left ventricular internal cavity size was normal in size. There is  mild left ventricular hypertrophy. Left ventricular diastolic function could not be evaluated due to atrial fibrillation. Left ventricular diastolic function could not be evaluated. Right Ventricle: The right ventricular size is normal. Right vetricular wall thickness was not well visualized. Right ventricular systolic function is normal. There is mildly elevated pulmonary artery systolic pressure. The tricuspid regurgitant velocity  is 2.93 m/s, and with an assumed right atrial pressure of 3 mmHg, the estimated right ventricular systolic pressure is 68.1 mmHg. Left Atrium: Left atrial size was severely dilated. Right Atrium: Right atrial size was normal in size. Pericardium: There is no evidence of pericardial effusion. Mitral Valve: The mitral valve is grossly normal. There is mild thickening of the mitral valve leaflet(s). There is mild calcification of the mitral valve leaflet(s). Mild to moderate mitral annular calcification. Mild mitral valve regurgitation. Tricuspid Valve: The tricuspid valve is grossly normal. Tricuspid valve regurgitation is mild. Aortic Valve: The aortic valve is tricuspid. There is mild calcification of the aortic valve. There is mild thickening of the aortic valve. Aortic valve regurgitation is not visualized. Aortic valve mean gradient measures 4.0 mmHg. Aortic valve peak gradient measures 7.4 mmHg. Aortic valve area, by VTI measures 2.96 cm. Pulmonic Valve: The pulmonic valve was grossly normal. Pulmonic valve regurgitation is not visualized. Aorta: The aortic root and ascending aorta are structurally normal, with no evidence of dilitation. IAS/Shunts: The atrial septum is grossly normal.  LEFT VENTRICLE PLAX 2D LVIDd:          4.60 cm LVIDs:         3.30 cm LV PW:         1.30 cm LV IVS:        1.30 cm LVOT diam:     2.30 cm LV SV:         61 LV SV Index:   28 LVOT Area:     4.15 cm  RIGHT VENTRICLE RV Basal diam:  3.20 cm RV S prime:     10.30 cm/s TAPSE (M-mode): 1.4 cm LEFT ATRIUM              Index        RIGHT ATRIUM           Index LA diam:        4.80 cm  2.23 cm/m   RA Area:     12.80 cm LA Vol (A2C):   118.0 ml 54.73 ml/m  RA Volume:   26.00 ml  12.06 ml/m LA Vol (A4C):   96.8 ml  44.90 ml/m LA Biplane Vol: 112.0 ml 51.95 ml/m  AORTIC VALVE AV Area (Vmax):    2.79 cm AV Area (Vmean):   2.84 cm AV Area (VTI):     2.96 cm AV Vmax:  136.00 cm/s AV Vmean:          92.600 cm/s AV VTI:            0.205 m AV Peak Grad:      7.4 mmHg AV Mean Grad:      4.0 mmHg LVOT Vmax:         91.30 cm/s LVOT Vmean:        63.200 cm/s LVOT VTI:          0.146 m LVOT/AV VTI ratio: 0.71  AORTA Ao Root diam: 3.40 cm TRICUSPID VALVE TR Peak grad:   34.3 mmHg TR Vmax:        293.00 cm/s  SHUNTS Systemic VTI:  0.15 m Systemic Diam: 2.30 cm Mertie Moores MD Electronically signed by Mertie Moores MD Signature Date/Time: 10/15/2020/4:41:41 PM    Final         Scheduled Meds:  influenza vaccine adjuvanted  0.5 mL Intramuscular Tomorrow-1000   lisinopril  10 mg Oral Daily   metoprolol succinate  100 mg Oral Daily   sodium chloride flush  3 mL Intravenous Q12H   warfarin  5 mg Oral ONCE-1600   Warfarin - Pharmacist Dosing Inpatient   Does not apply q1600   Continuous Infusions:   LOS: 2 days    Time spent: 39 min   Georgette Shell, MD  10/16/2020, 3:56 PM

## 2020-10-16 NOTE — Social Work (Signed)
12:00pm- visit with patient at bedside. His son, Louie Casa was visiting as well. CSW briefly talked with the patient and PT/OT recommendation of short term rehab at [facility]. Patient and son,Randy reports preference is for patient to discharge home. Patient states she has his sons close by but also expressed to discuss recommendations with his spouse.  4:00pm- CSW called patient's spouse # [phone]- left voice message to return call.   TOC will continue to follow and assist with discharge planning.   Thurmond Butts, MSW, LCSW Clinical Social Worker

## 2020-10-16 NOTE — Progress Notes (Signed)
Pt O2 desating to 87-88  Increased O2 from 2L to 5L increasing O2 to 93-94

## 2020-10-16 NOTE — TOC Initial Note (Signed)
Transition of Care [facility]) - Initial/Assessment Note    Patient Details  Name: [name] MRN: [id number] Date of Birth: 1930/02/02  [facility] of [facility]) CM/SW Contact:    Vinie Sill, LCSW Phone Number: 10/16/2020, 5:15 PM  Clinical Narrative:                  CSW met with patient, spouse and grandson. CSW introduced self and explained role. CSW discussed with spouse PT/OT recommendations. She states the patient wants to go home and she hopes he gets strong enough to return home. If not, she states they "will have to do something different". CSW explained the SNF process and was given permission to send out referrals as back up plan. Family states no questions or concerns at this time.   TOC will provide bed offers once provided. TOC will continue to follow and assist with discharge planning.  Thurmond Butts, MSW, LCSW Clinical Social Worker      Barriers to Discharge: Continued Medical Work up, SNF Pending bed offer   Patient Goals and CMS Choice        Expected Discharge Plan and Services   In-house Referral: Clinical Social Work     Living arrangements for the past 2 months: Single Family Home                                      Prior Living Arrangements/Services Living arrangements for the past 2 months: Single Family Home Lives with:: Self, Spouse Patient language and need for interpreter reviewed:: No        Need for Family Participation in Patient Care: Yes (Comment) Care giver support system in place?: Yes (comment)   Criminal Activity/Legal Involvement Pertinent to Current Situation/Hospitalization: No - Comment as needed  Activities of Daily Living      Permission Sought/Granted Permission sought to share information with : Family Supports Permission granted to share information with : Yes, Verbal Permission Granted  Share Information with NAME: Germaine Shenker  Permission granted to share info w AGENCY: SNF  Permission granted  to share info w Relationship: son  Permission granted to share info w Contact Information: [phone]  Emotional Assessment Appearance:: Appears stated age Attitude/Demeanor/Rapport: Engaged Affect (typically observed): Pleasant, Appropriate Orientation: : Oriented to Self, Oriented to Place, Oriented to  Time, Oriented to Situation Alcohol / Substance Use: Not Applicable Psych Involvement: No (comment)  Admission diagnosis:  Shortness of breath [R06.02] Hyponatremia [E87.1] Fall, initial encounter [W19.XXXA] Patient Active Problem List   Diagnosis Date Noted   Hyponatremia 10/14/2020   Hyperbilirubinemia 10/14/2020   Acute respiratory failure with hypoxia ([place]) 74/82/7078   Acute diastolic CHF (congestive heart failure) ([place]) 10/14/2020   Subdural hematoma 10/22/2015   Thrombocytopenia ([place]) 10/22/2015   Chronic venous stasis dermatitis 10/22/2015   Fall at home, initial encounter    Head injury    Essential hypertension 67/54/4920   Embolic stroke ([place]) 10/16/1217   Long term current use of anticoagulant therapy 12/25/2012   Atrial fibrillation ([place]) 10/15/2012   Acute, but ill-defined, cerebrovascular disease 10/15/2012   PCP:  Gaynelle Arabian, MD Pharmacy:   [facility], [address] [place] [address] Phone: [phone] Fax: [phone]     Social Determinants of Health (SDOH) Interventions    Readmission Risk Interventions No flowsheet data found.

## 2020-10-16 NOTE — Progress Notes (Signed)
Progress Note  Patient Name: [name] Date of Encounter: 10/16/2020  [facility] Cardiologist: Sinclair Grooms, MD   Subjective   Feeling better.  Thirsty this AM. Already had 4 drinks.   Inpatient Medications    Scheduled Meds:  furosemide  40 mg Intravenous BID   influenza vaccine adjuvanted  0.5 mL Intramuscular Tomorrow-1000   lisinopril  10 mg Oral Daily   metoprolol succinate  100 mg Oral Daily   sodium chloride flush  3 mL Intravenous Q12H   warfarin  5 mg Oral ONCE-1600   Warfarin - Pharmacist Dosing Inpatient   Does not apply q1600   Continuous Infusions:  PRN Meds: acetaminophen **OR** acetaminophen, albuterol, diclofenac Sodium, metoprolol tartrate   Vital Signs    Vitals:   10/15/20 2339 10/16/20 0257 10/16/20 0300 10/16/20 0807  BP: (!) 143/77 (!) 145/80 (!) 145/80 132/85  Pulse: 92 90 (!) 101 100  Resp: 20 20 19 20   Temp: 98.1 F (36.7 C) 98.1 F (36.7 C)  98 F (36.7 C)  TempSrc: Oral Oral  Oral  SpO2: 97% 97% 93% 92%  Weight:  98.2 kg      Intake/Output Summary (Last 24 hours) at 10/16/2020 0901 Last data filed at 10/16/2020 0655 Gross per 24 hour  Intake --  Output 1650 ml  Net -1650 ml   Last 3 Weights 10/16/2020 10/15/2020 08/31/2020  Weight (lbs) 216 lb 7.9 oz 215 lb 13.3 oz 200 lb  Weight (kg) 98.2 kg 97.9 kg 90.719 kg      Telemetry    Atrial fibrillation.  - Personally Reviewed  ECG    Atrial fibrillation.  Rate 96 bpm.  RAD. - Personally Reviewed  Physical Exam   GEN: No acute distress.  More alert Neck: No JVD Cardiac: Irregularly irregular.  No murmurs, rubs, or gallops.  Respiratory: Clear to auscultation bilaterally. GI: Soft, nontender, non-distended  MS: Trace bilateral LE edema; No deformity. SKIN: Chronic stasis dermatitis. Neuro:  Nonfocal  Psych: Normal affect   Labs    High Sensitivity Troponin:  No results for input(s): TROPONINIHS in the last 720 hours.   Chemistry Recent Labs  Lab  10/13/20 1607 10/14/20 0716 10/14/20 1437 10/15/20 0129 10/16/20 0158  NA 127*  --  130* 125* 122*  K 3.7  --  4.0 3.7 3.5  CL 88*  --   --  88* 85*  CO2 29  --   --  29 30  GLUCOSE 110*  --   --  111* 115*  BUN 11  --   --  11 15  CREATININE 0.93  --   --  0.86 1.01  CALCIUM 8.9  --   --  8.7* 8.6*  MG  --   --   --   --  1.7  PROT  --  <3.0*  --  7.4  --   ALBUMIN  --  3.5  --  3.2*  --   AST  --  43*  --  41  --   ALT  --  27  --  24  --   ALKPHOS  --  54  --  56  --   BILITOT  --  1.5*  --  1.8*  --   GFRNONAA >60  --   --  >60 >60  ANIONGAP 10  --   --  8 7    Lipids  Recent Labs  Lab 10/15/20 1302  CHOL 137  TRIG 87  HDL 45  LDLCALC 75  CHOLHDL 3.0    Hematology Recent Labs  Lab 10/13/20 1607 10/14/20 1437 10/15/20 0129  WBC 8.9  --  8.7  RBC 4.63  --  4.73  HGB 13.6 15.6 13.6  HCT 40.6 46.0 40.9  MCV 87.7  --  86.5  MCH 29.4  --  28.8  MCHC 33.5  --  33.3  RDW 13.8  --  13.5  PLT PLATELET CLUMPS NOTED ON SMEAR, UNABLE TO ESTIMATE  --  PLATELET CLUMPS NOTED ON SMEAR, UNABLE TO ESTIMATE   Thyroid  Recent Labs  Lab 10/14/20 1426  TSH 0.991    BNP Recent Labs  Lab 10/14/20 0928  BNP 141.1*    DDimer No results for input(s): DDIMER in the last 168 hours.   Radiology    DG Shoulder Left  Result Date: 10/14/2020 CLINICAL DATA:  Fall, pain in left shoulder EXAM: LEFT SHOULDER - 2+ VIEW COMPARISON:  Shoulder radiographs 08/31/2020 FINDINGS: No acute fracture or dislocation is seen. Advanced degenerative changes of the glenohumeral joint are again seen. The soft tissues are unremarkable. IMPRESSION: No acute fracture or dislocation identified. Electronically Signed   By: Valetta Mole M.D.   On: 10/14/2020 09:59   EEG adult  Result Date: 10/15/2020 Lora Havens, MD     10/15/2020 11:02 AM Patient Name: [name] MRN: [id number] Epilepsy Attending: Lora Havens Referring Physician/Provider: Dr Fuller Plan Date: 10/15/2020 Duration: 23.41  mins Patient history: 87 year old male with altered mental status.  EEG to evaluate for seizures. Level of alertness: Awake AEDs during EEG study: None Technical aspects: This EEG study was done with scalp electrodes positioned according to the 10-20 International system of electrode placement. Electrical activity was acquired at a sampling rate of 500Hz  and reviewed with a high frequency filter of 70Hz  and a low frequency filter of 1Hz . EEG data were recorded continuously and digitally stored. Description: The posterior dominant rhythm consists of 8 Hz activity of moderate voltage (25-35 uV) seen predominantly in posterior head regions, symmetric and reactive to eye opening and eye closing. Hyperventilation and photic stimulation were not performed.   IMPRESSION: This study is within normal limits. No seizures or epileptiform discharges were seen throughout the recording. Lora Havens   ECHOCARDIOGRAM COMPLETE  Result Date: 10/15/2020    ECHOCARDIOGRAM REPORT   Patient Name:   [name] Date of Exam: 10/15/2020 Medical Rec #:  [id number]     Height:       70.0 in Accession #:    [id number]    Weight:       215.8 lb Date of Birth:  1930/11/05     BSA:          2.156 m Patient Age:    48 years      BP:           141/88 mmHg Patient Gender: M             HR:           111 bpm. Exam Location:  Inpatient Procedure: 2D Echo, Cardiac Doppler and Color Doppler Indications:    CHF-Acute Diastolic  History:        Patient has prior history of Echocardiogram examinations, most                 recent 01/10/2007. Risk Factors:Morbid Obesity.  Sonographer:    Merrie Roof RDCS Referring Phys: [id number] [place]  1. Left ventricular  ejection fraction, by estimation, is 60 to 65%. The left ventricle has normal function. The left ventricle has no regional wall motion abnormalities. There is mild left ventricular hypertrophy. Left ventricular diastolic function could not be evaluated.  2. Right ventricular  systolic function is normal. The right ventricular size is normal. There is mildly elevated pulmonary artery systolic pressure.  3. Left atrial size was severely dilated.  4. The mitral valve is grossly normal. Mild mitral valve regurgitation.  5. The aortic valve is tricuspid. There is mild calcification of the aortic valve. There is mild thickening of the aortic valve. Aortic valve regurgitation is not visualized. FINDINGS  Left Ventricle: Left ventricular ejection fraction, by estimation, is 60 to 65%. The left ventricle has normal function. The left ventricle has no regional wall motion abnormalities. The left ventricular internal cavity size was normal in size. There is  mild left ventricular hypertrophy. Left ventricular diastolic function could not be evaluated due to atrial fibrillation. Left ventricular diastolic function could not be evaluated. Right Ventricle: The right ventricular size is normal. Right vetricular wall thickness was not well visualized. Right ventricular systolic function is normal. There is mildly elevated pulmonary artery systolic pressure. The tricuspid regurgitant velocity  is 2.93 m/s, and with an assumed right atrial pressure of 3 mmHg, the estimated right ventricular systolic pressure is 74.1 mmHg. Left Atrium: Left atrial size was severely dilated. Right Atrium: Right atrial size was normal in size. Pericardium: There is no evidence of pericardial effusion. Mitral Valve: The mitral valve is grossly normal. There is mild thickening of the mitral valve leaflet(s). There is mild calcification of the mitral valve leaflet(s). Mild to moderate mitral annular calcification. Mild mitral valve regurgitation. Tricuspid Valve: The tricuspid valve is grossly normal. Tricuspid valve regurgitation is mild. Aortic Valve: The aortic valve is tricuspid. There is mild calcification of the aortic valve. There is mild thickening of the aortic valve. Aortic valve regurgitation is not visualized.  Aortic valve mean gradient measures 4.0 mmHg. Aortic valve peak gradient measures 7.4 mmHg. Aortic valve area, by VTI measures 2.96 cm. Pulmonic Valve: The pulmonic valve was grossly normal. Pulmonic valve regurgitation is not visualized. Aorta: The aortic root and ascending aorta are structurally normal, with no evidence of dilitation. IAS/Shunts: The atrial septum is grossly normal.  LEFT VENTRICLE PLAX 2D LVIDd:         4.60 cm LVIDs:         3.30 cm LV PW:         1.30 cm LV IVS:        1.30 cm LVOT diam:     2.30 cm LV SV:         61 LV SV Index:   28 LVOT Area:     4.15 cm  RIGHT VENTRICLE RV Basal diam:  3.20 cm RV S prime:     10.30 cm/s TAPSE (M-mode): 1.4 cm LEFT ATRIUM              Index        RIGHT ATRIUM           Index LA diam:        4.80 cm  2.23 cm/m   RA Area:     12.80 cm LA Vol (A2C):   118.0 ml 54.73 ml/m  RA Volume:   26.00 ml  12.06 ml/m LA Vol (A4C):   96.8 ml  44.90 ml/m LA Biplane Vol: 112.0 ml 51.95 ml/m  AORTIC VALVE AV Area (  Vmax):    2.79 cm AV Area (Vmean):   2.84 cm AV Area (VTI):     2.96 cm AV Vmax:           136.00 cm/s AV Vmean:          92.600 cm/s AV VTI:            0.205 m AV Peak Grad:      7.4 mmHg AV Mean Grad:      4.0 mmHg LVOT Vmax:         91.30 cm/s LVOT Vmean:        63.200 cm/s LVOT VTI:          0.146 m LVOT/AV VTI ratio: 0.71  AORTA Ao Root diam: 3.40 cm TRICUSPID VALVE TR Peak grad:   34.3 mmHg TR Vmax:        293.00 cm/s  SHUNTS Systemic VTI:  0.15 m Systemic Diam: 2.30 cm Mertie Moores MD Electronically signed by Mertie Moores MD Signature Date/Time: 10/15/2020/4:41:41 PM    Final     Cardiac Studies   Echo 10/15/20: IMPRESSIONS   1. Left ventricular ejection fraction, by estimation, is 60 to 65%. The  left ventricle has normal function. The left ventricle has no regional  wall motion abnormalities. There is mild left ventricular hypertrophy.  Left ventricular diastolic function  could not be evaluated.   2. Right ventricular systolic  function is normal. The right ventricular  size is normal. There is mildly elevated pulmonary artery systolic  pressure.   3. Left atrial size was severely dilated.   4. The mitral valve is grossly normal. Mild mitral valve regurgitation.   5. The aortic valve is tricuspid. There is mild calcification of the  aortic valve. There is mild thickening of the aortic valve. Aortic valve  regurgitation is not visualized.   Patient Profile     Mr. Bonura is an 28M with persistent atrial fibrillation, hypertension, hyperlipidemia, embolic stroke, chronic venous ulcers and memory loss admitted with acute on chronic diastolic heart failure and atrial fibrillation with RVR after a fall.  Assessment & Plan    # Acute on chronic diastolic heart failure:  # Hypertension:  # Hyponatremia: BNP mildly elevated on admission.  He has chronic LE edema that has improved.  However, on echo, here isn't really any evidence of heart failure.  His systolic function is normal and his IVC is not dilated.  His hyponatremia is worsening with diuresis.  Thyroid function, renal function and glucose are all normal.  He is on a thiazide diuretic chronically.   Recommend stopping all diuretics and encouraging him to drink.  Gentle hydration and watch sodium levels.  Continue lisinopril.   # Atrial fibrillation with RVR:  Metoprolol was increased due to poor rate control.   # Chest pain:  Hs troponin pending.  Currently chest pain free.  Echo was very reassuring as above.  # CVA:  Continue warfarin.   # Chronic LE edema:  Related to stasis dermatitis.  Stop thiazide diuretic.  Loop diuretic as needed for respiratory symptoms.  Recommend compression socks rather than diuretics for management.    [facility] will sign off.   Medication Recommendations:  Increase metoprolol as needed for HR control  Other recommendations (labs, testing, etc):  avoid thiazide diuretics.  Cm Follow up as an outpatient:  we will  arrange  For questions or updates, please contact [facility] Please consult [email or web address] for contact info under  Signed, Skeet Latch, MD  10/16/2020, 9:01 AM

## 2020-10-16 NOTE — Social Work (Signed)
  11:51am-Patient's son,Ricky returned phone call- CSW discussed PT/OT recommendation of short term rehab at SNF- he requested, I talk with her mother, the patient's spouse.   12:00pm- CSW went to complete assessment- patient care was in progress- uable to speak with the patient's spouse.  Thurmond Butts, MSW, LCSW Clinical Social Worker

## 2020-10-16 NOTE — Progress Notes (Signed)
ANTICOAGULATION CONSULT NOTE   Pharmacy Consult for warfarin Indication: atrial fibrillation  No Known Allergies  Patient Measurements: Weight: 98.2 kg (216 lb 7.9 oz) Heparin Dosing Weight: n/a  Vital Signs: Temp: 98 F (36.7 C) (10/07 0807) Temp Source: Oral (10/07 0807) BP: 132/85 (10/07 0807) Pulse Rate: 100 (10/07 0807)  Labs: Recent Labs    10/13/20 1607 10/14/20 0716 10/14/20 1426 10/14/20 1437 10/15/20 0129 10/16/20 0158 10/16/20 0437  HGB 13.6  --   --  15.6 13.6  --   --   HCT 40.6  --   --  46.0 40.9  --   --   PLT PLATELET CLUMPS NOTED ON SMEAR, UNABLE TO ESTIMATE  --   --   --  PLATELET CLUMPS NOTED ON SMEAR, UNABLE TO ESTIMATE  --   --   LABPROT  --  26.7*  --   --  26.1*  --  28.6*  INR  --  2.5*  --   --  2.4*  --  2.7*  CREATININE 0.93  --   --   --  0.86 1.01  --   CKTOTAL  --   --  413*  --   --   --   --      Estimated Creatinine Clearance: 59.4 mL/min (by C-G formula based on SCr of 1.01 mg/dL).    Assessment: 3 yoM on warfarin PTA for afib presenting s/p fall. Imaging negative for bleed. Pharmacy consulted to dose warfarin inpatient. INR is therapeutic at 2.7.   PTA warfarin dose: 5mg  daily except 7.5mg  on Mon (last dose 10/4 PTA)  Goal of Therapy:  INR 2-3 Monitor platelets by anticoagulation protocol: Yes   Plan:  Warfarin 5mg  PO x1 tonight Daily INR  Arrie Senate, PharmD, BCPS, [facility] Clinical Pharmacist [phone] Please check AMION for all [place] numbers 10/16/2020

## 2020-10-16 NOTE — Progress Notes (Signed)
Mobility Specialist Criteria Algorithm Info.  Mobility Team: HOB elevated: Activity: Transferred:  Bed to chair Range of motion: Active; Passive Level of assistance: +2 (takes two people) Assistive device: Front wheel walker Minutes sitting in chair:  Minutes stood: 2 minutes Minutes ambulated:  Distance ambulated (ft): 2 ft Mobility response: Tolerated fair Bed Position: Chair  Patient received lying supine in bed requesting assistance to chair. Required mod A for bed mobility + cues to push through elbow into seated position supine<>sit. Upon sitting EOB pt needed minimal assistance to steady while dangling d/t posterior lean. Required max A +2 to stand + cues for hand placement. Pivot to recliner chair requiring min-mod A +2 to steady. Was left in recliner chair with all needs met and family at bedside.   10/16/2020 4:32 PM

## 2020-10-16 NOTE — Progress Notes (Signed)
Heart Failure Nurse Navigator Progress Note  Following this admission to assess for HV TOC. Currently pt requiring increased oxygen needs. Physical function needs to improve, per nursing staff pt up with 2 assist to chair from bed.   Pricilla Holm, MSN, RN Heart Failure Nurse Navigator [phone]

## 2020-10-17 ENCOUNTER — Inpatient Hospital Stay (HOSPITAL_COMMUNITY): Payer: Medicare Other

## 2020-10-17 LAB — BASIC METABOLIC PANEL
Anion gap: 6 (ref 5–15)
BUN: 14 mg/dL (ref 8–23)
CO2: 31 mmol/L (ref 22–32)
Calcium: 8.4 mg/dL — ABNORMAL LOW (ref 8.9–10.3)
Chloride: 84 mmol/L — ABNORMAL LOW (ref 98–111)
Creatinine, Ser: 0.83 mg/dL (ref 0.61–1.24)
GFR, Estimated: >60 mL/min (ref >60)
Glucose, Bld: 110 mg/dL — ABNORMAL HIGH (ref 70–99)
Potassium: 3.7 mmol/L (ref 3.5–5.1)
Sodium: 121 mmol/L — ABNORMAL LOW (ref 135–145)

## 2020-10-17 LAB — MAGNESIUM: Magnesium: 1.9 mg/dL (ref 1.7–2.4)

## 2020-10-17 LAB — PROTIME-INR
INR: 2.5 — ABNORMAL HIGH (ref 0.8–1.2)
Prothrombin Time: 27.3 seconds — ABNORMAL HIGH (ref 11.4–15.2)

## 2020-10-17 MED ORDER — FUROSEMIDE 10 MG/ML IJ SOLN
40.0000 mg | Freq: Three times a day (TID) | INTRAMUSCULAR | Status: DC
  Administered 2020-10-17 – 2020-10-18 (×3): 40 mg via INTRAVENOUS
  Filled 2020-10-17 (×3): qty 4

## 2020-10-17 MED ORDER — WARFARIN SODIUM 5 MG PO TABS
5.0000 mg | ORAL_TABLET | Freq: Once | ORAL | Status: AC
  Administered 2020-10-17: 5 mg via ORAL
  Filled 2020-10-17: qty 1

## 2020-10-17 NOTE — NC FL2 (Signed)
[place] LEVEL OF CARE SCREENING TOOL     IDENTIFICATION  Patient Name: [name] Birthdate: August 09, 1930 Sex: male Admission Date (Current Location): 10/13/2020  [facility] and [place] Number:  Herbalist and Address:  [facility] [place] [place] [address] [place], [place] [address]      Provider Number: [id number]  Attending Physician Name and Address:  Georgette Shell, MD  Relative Name and Phone Number:  Ricky [facility]: Hospital Recommended Level of Care: [place] Prior Approval Number:    Date Approved/Denied:   PASRR Number: [id number]  Discharge Plan: SNF    Current Diagnoses: Patient Active Problem List   Diagnosis Date Noted   Hyponatremia 10/14/2020   Hyperbilirubinemia 10/14/2020   Acute respiratory failure with hypoxia ([place]) 99/83/3825   Acute diastolic CHF (congestive heart failure) ([place]) 10/14/2020   Subdural hematoma 10/22/2015   Thrombocytopenia ([place]) 10/22/2015   Chronic venous stasis dermatitis 10/22/2015   Fall at home, initial encounter    Head injury    Essential hypertension 05/39/7673   Embolic stroke ([place]) 41/93/7902   Long term current use of anticoagulant therapy 12/25/2012   Atrial fibrillation ([place]) 10/15/2012   Acute, but ill-defined, cerebrovascular disease 10/15/2012    Orientation RESPIRATION BLADDER Height & Weight     Self, Time  O2 (2L) Incontinent, External catheter Weight: 216 lb 14.9 oz (98.4 kg) Height:     BEHAVIORAL SYMPTOMS/MOOD NEUROLOGICAL BOWEL NUTRITION STATUS      Incontinent Diet (See DC summary)  AMBULATORY STATUS COMMUNICATION OF NEEDS Skin   Extensive Assist Verbally Other (Comment), Surgical wounds (incision, pretibial right)                       Personal Care Assistance Level of Assistance  Bathing, Feeding, Dressing Bathing Assistance: Maximum assistance Feeding assistance: Limited assistance Dressing  Assistance: Maximum assistance     Functional Limitations Info  Sight, Hearing, Speech Sight Info: Adequate Hearing Info: Impaired Speech Info: Adequate    SPECIAL CARE FACTORS FREQUENCY  PT (By licensed PT), OT (By licensed OT)     PT Frequency: 5x per week OT Frequency: 5x per week            Contractures Contractures Info: Not present    Additional Factors Info  Code Status, Allergies Code Status Info: Full Allergies Info: NKA           Current Medications (10/17/2020):  This is the current hospital active medication list Current Facility-Administered Medications  Medication Dose Route Frequency Provider Last Rate Last Admin   acetaminophen (TYLENOL) tablet 650 mg  650 mg Oral Q6H PRN Fuller Plan A, MD   650 mg at 10/15/20 1038   Or   acetaminophen (TYLENOL) suppository 650 mg  650 mg Rectal Q6H PRN Fuller Plan A, MD       albuterol (PROVENTIL) (2.5 MG/3ML) 0.083% nebulizer solution 2.5 mg  2.5 mg Nebulization Q6H PRN Fuller Plan A, MD   2.5 mg at 10/16/20 1236   diclofenac Sodium (VOLTAREN) 1 % topical gel 2 g  2 g Topical BID PRN Fuller Plan A, MD   2 g at 10/16/20 1236   furosemide (LASIX) injection 40 mg  40 mg Intravenous Q8H Roney Jaffe, MD   40 mg at 10/17/20 1555   metoprolol succinate (TOPROL-XL) 24 hr tablet 100 mg  100 mg Oral Daily Margie Billet, NP  100 mg at 10/17/20 0836   metoprolol tartrate (LOPRESSOR) injection 5 mg  5 mg Intravenous Q6H PRN Georgette Shell, MD       sodium chloride flush (NS) 0.9 % injection 3 mL  3 mL Intravenous Q12H Fuller Plan A, MD   3 mL at 10/17/20 4540   Warfarin - Pharmacist Dosing Inpatient   Does not apply q1600 [facility]   Given at 10/15/20 1910     Discharge Medications: Please see discharge summary for a list of discharge medications.  Relevant Imaging Results:  Relevant Lab Results:   Additional Information SSN# [id number] pt vaccinnated on one booster  Bary Castilla,  LCSW

## 2020-10-17 NOTE — Progress Notes (Signed)
PROGRESS NOTE    [name]  TMA:[id number] DOB: [birth year] DOA: 10/13/2020 PCP: Gaynelle Arabian, MD    Brief Narrative: [name] is a 87 y.o. male with medical history significant of atrial fibrillation on chronic anticoagulation, embolic CVA, and venous stasis ulcers who presents after having an unwitnessed fall yesterday morning at around 4 AM.  Patient is unable to give history at this time as he is altered and obtained from his son.  Patient was found on the floor and thought that he likely fell backwards.  His son was able to help pick him up, but the patient was complaining of left shoulder and right leg pain.  His son notes that at some point they found some blood present in his stool when he had a bowel movement, but when they wiped him they did not notice any further blood.  Upon further questioning it appears the patient is 4 days and has been more weak.  At baseline patient poor recent memory, but was more confused than normal and not oriented to place or time.  Reported that it seemed like he was hallucinating complaining of the floor moving and saying things like "want to play this game" that did not make sense.  Associated symptoms include swelling of his leg and he has been followed by wound care for venous stasis ulcers.  While in the emergency department patient fell asleep twice and had shaking of his arms that lasted briefly.  No reports of tongue biting, incontinence of urine, or fevers.  He is not on oxygen at baseline and he quit smoking several years ago.   ED Course: Upon admission into the emergency department patient was noted to be afebrile, pulse 81-1 06, respirations 16-21, blood pressures maintained, and O2 saturations as low as 87% on room air with improvement on 2 L of oxygen.  CT scan of the head and cervical spine did not note any acute fracture or intercranial abnormality.  Labs since 10/4 significant for platelet count noted, sodium 127, CO2 29, chloride 88, AST  43, total bilirubin 1.5, BNP 141.1.  Chest x-ray noted stable cardiac silhouette with concern for interstitial edema pattern.  CT scan of the pelvis was significant for soft tissues contusion or hematoma overlying the posterior lateral right hip.  Noted urinalysis was positive for small hemoglobin and greater than 50 RBCs.  Assessment & Plan:   Principal Problem:   Acute respiratory failure with hypoxia (HCC) Active Problems:   Atrial fibrillation (HCC)   Long term current use of anticoagulant therapy   Essential hypertension   Fall at home, initial encounter   Hyponatremia   Hyperbilirubinemia   Acute diastolic CHF (congestive heart failure) ([place])    #1 acute hypoxic respiratory failure secondary to acute diastolic heart failure exacerbation. Echo ejection fraction 60 to 65%.   Patient was hypoxic at 87% on room air on admission with 2+ bilateral pitting edema and chest x-ray consistent with pulmonary edema and elevated BNP. Lasix stopped by cardiology  in view of normal ejection fraction and worsening hyponatremia.  #2 acute metabolic encephalopathy likely from hypoxia secondary to #1.  Patient lives alone at home however his son lives at the back of the house and keeps an eye on him. TSH normal Ammonia not elevated EEG no seizures or epileptiform discharges normal study.  #3chronic afib on coumadin   #4 fall at home x-rays of the hip shows no acute fracture PT recommending SNF.  #5 hypervolemic hyponatremia -likely multifactorial  Seizure precautions  He was on HCTZ at home which has been stopped  Normal TSH  Urine osm normal Serum osm was ordered dont see Check serum osm  sodium dropped to 121 from  122 from 125 from 130 on admission, if not better will consult renal tomorrow. Nephrology consulted  Avoid hypotonic fluids Repeat labs in a.m.  #6 history of essential hypertension continue current meds  #7 chronic venous stasis dermatitis with 2 open wound on the right  lower extremity seen by wound care nurse.  Secure with foam dressing Wrap the right leg with Kerlix and Coban change 3 times a week.    Estimated body mass index is 31.13 kg/m as calculated from the following:   Height as of 08/31/20: 5\' 10"  (1.778 m).   Weight as of this encounter: 98.4 kg.  DVT prophylaxis: coumadin Code Status:full Family Communication: dw son Disposition Plan:  Status is: Inpatient  Remains inpatient appropriate because:IV treatments appropriate due to intensity of illness or inability to take PO  Dispo: The patient is from: Home              Anticipated d/c is to: Home              Patient currently is not medically stable to d/c.   Difficult to place patient No   Consultants:  cardio  Procedures: none Antimicrobials: None  Subjective:  He is resting in bed his son is by the bedside son stayed with him overnight he feels his dad's mentation is better  objective: Vitals:   10/16/20 2334 10/17/20 0359 10/17/20 0744 10/17/20 1218  BP: 135/77 (!) 151/85 (!) 145/90 119/76  Pulse: 88 84 97 85  Resp: 17 16 16 19   Temp: 98.2 F (36.8 C) 98.6 F (37 C) 97.7 F (36.5 C) 97.8 F (36.6 C)  TempSrc: Oral Oral Oral Oral  SpO2: 98% 92% 97% 98%  Weight:  98.4 kg      Intake/Output Summary (Last 24 hours) at 10/17/2020 1335 Last data filed at 10/16/2020 1801 Gross per 24 hour  Intake 240 ml  Output --  Net 240 ml    Filed Weights   10/15/20 0422 10/16/20 0257 10/17/20 0359  Weight: 97.9 kg 98.2 kg 98.4 kg    Examination:  General exam: Appears calm and comfortable  Respiratory system: Crackles at the bases to auscultation. Respiratory effort normal. Cardiovascular system: S1 & S2 heard, RRR. No JVD, murmurs, rubs, gallops or clicks. No pedal edema. Gastrointestinal system: Abdomen is nondistended, soft and nontender. No organomegaly or masses felt. Normal bowel sounds heard. Central nervous system: Alert and oriented. No focal neurological  deficits. Extremities: 2+ bilateral pitting edema  skin: Right lower extremity to open wounds Psychiatry: Judgement and insight appear normal. Mood & affect appropriate.     Data Reviewed: I have personally reviewed following labs and imaging studies  CBC: Recent Labs  Lab 10/13/20 1607 10/14/20 1437 10/15/20 0129  WBC 8.9  --  8.7  NEUTROABS 7.0  --   --   HGB 13.6 15.6 13.6  HCT 40.6 46.0 40.9  MCV 87.7  --  86.5  PLT PLATELET CLUMPS NOTED ON SMEAR, UNABLE TO ESTIMATE  --  PLATELET CLUMPS NOTED ON SMEAR, UNABLE TO ESTIMATE    Basic Metabolic Panel: Recent Labs  Lab 10/13/20 1607 10/14/20 1437 10/15/20 0129 10/16/20 0158 10/17/20 0140  NA 127* 130* 125* 122* 121*  K 3.7 4.0 3.7 3.5 3.7  CL 88*  --  88*  85* 84*  CO2 29  --  29 30 31   GLUCOSE 110*  --  111* 115* 110*  BUN 11  --  11 15 14   CREATININE 0.93  --  0.86 1.01 0.83  CALCIUM 8.9  --  8.7* 8.6* 8.4*  MG  --   --   --  1.7 1.9    GFR: Estimated Creatinine Clearance: 72.4 mL/min (by C-G formula based on SCr of 0.83 mg/dL). Liver Function Tests: Recent Labs  Lab 10/14/20 0716 10/15/20 0129  AST 43* 41  ALT 27 24  ALKPHOS 54 56  BILITOT 1.5* 1.8*  PROT <3.0* 7.4  ALBUMIN 3.5 3.2*    No results for input(s): LIPASE, AMYLASE in the last 168 hours. Recent Labs  Lab 10/14/20 1426  AMMONIA 23    Coagulation Profile: Recent Labs  Lab 10/14/20 0716 10/15/20 0129 10/16/20 0437 10/17/20 0140  INR 2.5* 2.4* 2.7* 2.5*    Cardiac Enzymes: Recent Labs  Lab 10/14/20 1426  CKTOTAL 413*    BNP (last 3 results) No results for input(s): PROBNP in the last 8760 hours. HbA1C: No results for input(s): HGBA1C in the last 72 hours. CBG: No results for input(s): GLUCAP in the last 168 hours. Lipid Profile: Recent Labs    10/15/20 1302  CHOL 137  HDL 45  LDLCALC 75  TRIG 87  CHOLHDL 3.0    Thyroid Function Tests: Recent Labs    10/14/20 1426  TSH 0.991    Anemia Panel: No results for  input(s): VITAMINB12, FOLATE, FERRITIN, TIBC, IRON, RETICCTPCT in the last 72 hours. Sepsis Labs: Recent Labs  Lab 10/14/20 1942  PROCALCITON <0.10     Recent Results (from the past 240 hour(s))  Resp Panel by RT-PCR (Flu A&B, Covid) Nasopharyngeal Swab     Status: None   Collection Time: 10/14/20 12:42 PM   Specimen: Nasopharyngeal Swab; Nasopharyngeal(NP) swabs in vial transport medium  Result Value Ref Range Status   SARS Coronavirus 2 by RT PCR NEGATIVE NEGATIVE Final    Comment: (NOTE) SARS-CoV-2 target nucleic acids are NOT DETECTED.  The SARS-CoV-2 RNA is generally detectable in upper respiratory specimens during the acute phase of infection. The lowest concentration of SARS-CoV-2 viral copies this assay can detect is 138 copies/mL. A negative result does not preclude SARS-Cov-2 infection and should not be used as the sole basis for treatment or other patient management decisions. A negative result may occur with  improper specimen collection/handling, submission of specimen other than nasopharyngeal swab, presence of viral mutation(s) within the areas targeted by this assay, and inadequate number of viral copies(<138 copies/mL). A negative result must be combined with clinical observations, patient history, and epidemiological information. The expected result is Negative.  Fact Sheet for Patients:  EntrepreneurPulse.com.au  Fact Sheet for Healthcare Providers:  IncredibleEmployment.be  This test is no t yet approved or cleared by the [place] FDA and  has been authorized for detection and/or diagnosis of SARS-CoV-2 by FDA under an Emergency Use Authorization (EUA). This EUA will remain  in effect (meaning this test can be used) for the duration of the COVID-19 declaration under Section 564(b)(1) of the Act, 21 U.S.C.section 360bbb-3(b)(1), unless the authorization is terminated  or revoked sooner.       Influenza A by PCR  NEGATIVE NEGATIVE Final   Influenza B by PCR NEGATIVE NEGATIVE Final    Comment: (NOTE) The Xpert Xpress SARS-CoV-2/FLU/RSV plus assay is intended as an aid in the diagnosis of influenza from Nasopharyngeal  swab specimens and should not be used as a sole basis for treatment. Nasal washings and aspirates are unacceptable for Xpert Xpress SARS-CoV-2/FLU/RSV testing.  Fact Sheet for Patients: EntrepreneurPulse.com.au  Fact Sheet for Healthcare Providers: IncredibleEmployment.be  This test is not yet approved or cleared by the [place] FDA and has been authorized for detection and/or diagnosis of SARS-CoV-2 by FDA under an Emergency Use Authorization (EUA). This EUA will remain in effect (meaning this test can be used) for the duration of the COVID-19 declaration under Section 564(b)(1) of the Act, 21 U.S.C. section 360bbb-3(b)(1), unless the authorization is terminated or revoked.  Performed at [facility], [place] [address] [place], [place] [address]   Culture, blood (routine x 2)     Status: None (Preliminary result)   Collection Time: 10/14/20  9:25 PM   Specimen: BLOOD RIGHT HAND  Result Value Ref Range Status   Specimen Description BLOOD RIGHT HAND  Final   Special Requests   Final    BOTTLES DRAWN AEROBIC AND ANAEROBIC Blood Culture adequate volume   Culture   Final    NO GROWTH 2 DAYS Performed at [facility], [place] [address] [place], [place] [address]    Report Status PENDING  Incomplete          Radiology Studies: DG Chest 1 View  Result Date: 10/17/2020 CLINICAL DATA:  Edema EXAM: CHEST  1 VIEW COMPARISON:  10/14/2020 chest radiograph. FINDINGS: Low lung volumes. Stable cardiomediastinal silhouette with mild cardiomegaly. No pneumothorax. Blunting of the right costophrenic angle, similar, favor atelectasis. No left pleural effusion. Diffuse patchy hazy and linear opacities in both lungs, improved. Mild  platelike right lung base atelectasis, similar. IMPRESSION: 1. Low lung volumes. 2. Stable mild cardiomegaly. Decreased diffuse patchy hazy and linear opacities in both lungs, compatible with improving pulmonary edema. 3. Mild platelike right lung base atelectasis. Electronically Signed   By: Ilona Sorrel M.D.   On: 10/17/2020 10:33   ECHOCARDIOGRAM COMPLETE  Result Date: 10/15/2020    ECHOCARDIOGRAM REPORT   Patient Name:   KENNA KIRN Date of Exam: 10/15/2020 Medical Rec #:  [id number]     Height:       70.0 in Accession #:    [id number]    Weight:       215.8 lb Date of Birth:  [birth year]     BSA:          2.156 m Patient Age:    80 years      BP:           141/88 mmHg Patient Gender: M             HR:           111 bpm. Exam Location:  Inpatient Procedure: 2D Echo, Cardiac Doppler and Color Doppler Indications:    CHF-Acute Diastolic  History:        Patient has prior history of Echocardiogram examinations, most                 recent 01/10/2007. Risk Factors:Morbid Obesity.  Sonographer:    Merrie Roof RDCS Referring Phys: [id number] Arist[place]  1. Left ventricular ejection fraction, by estimation, is 60 to 65%. The left ventricle has normal function. The left ventricle has no regional wall motion abnormalities. There is mild left ventricular hypertrophy. Left ventricular diastolic function could not be evaluated.  2. Right ventricular systolic function is normal. The right ventricular size is normal. There  is mildly elevated pulmonary artery systolic pressure.  3. Left atrial size was severely dilated.  4. The mitral valve is grossly normal. Mild mitral valve regurgitation.  5. The aortic valve is tricuspid. There is mild calcification of the aortic valve. There is mild thickening of the aortic valve. Aortic valve regurgitation is not visualized. FINDINGS  Left Ventricle: Left ventricular ejection fraction, by estimation, is 60 to 65%. The left ventricle has normal function. The left  ventricle has no regional wall motion abnormalities. The left ventricular internal cavity size was normal in size. There is  mild left ventricular hypertrophy. Left ventricular diastolic function could not be evaluated due to atrial fibrillation. Left ventricular diastolic function could not be evaluated. Right Ventricle: The right ventricular size is normal. Right vetricular wall thickness was not well visualized. Right ventricular systolic function is normal. There is mildly elevated pulmonary artery systolic pressure. The tricuspid regurgitant velocity  is 2.93 m/s, and with an assumed right atrial pressure of 3 mmHg, the estimated right ventricular systolic pressure is 25.4 mmHg. Left Atrium: Left atrial size was severely dilated. Right Atrium: Right atrial size was normal in size. Pericardium: There is no evidence of pericardial effusion. Mitral Valve: The mitral valve is grossly normal. There is mild thickening of the mitral valve leaflet(s). There is mild calcification of the mitral valve leaflet(s). Mild to moderate mitral annular calcification. Mild mitral valve regurgitation. Tricuspid Valve: The tricuspid valve is grossly normal. Tricuspid valve regurgitation is mild. Aortic Valve: The aortic valve is tricuspid. There is mild calcification of the aortic valve. There is mild thickening of the aortic valve. Aortic valve regurgitation is not visualized. Aortic valve mean gradient measures 4.0 mmHg. Aortic valve peak gradient measures 7.4 mmHg. Aortic valve area, by VTI measures 2.96 cm. Pulmonic Valve: The pulmonic valve was grossly normal. Pulmonic valve regurgitation is not visualized. Aorta: The aortic root and ascending aorta are structurally normal, with no evidence of dilitation. IAS/Shunts: The atrial septum is grossly normal.  LEFT VENTRICLE PLAX 2D LVIDd:         4.60 cm LVIDs:         3.30 cm LV PW:         1.30 cm LV IVS:        1.30 cm LVOT diam:     2.30 cm LV SV:         61 LV SV Index:   28  LVOT Area:     4.15 cm  RIGHT VENTRICLE RV Basal diam:  3.20 cm RV S prime:     10.30 cm/s TAPSE (M-mode): 1.4 cm LEFT ATRIUM              Index        RIGHT ATRIUM           Index LA diam:        4.80 cm  2.23 cm/m   RA Area:     12.80 cm LA Vol (A2C):   118.0 ml 54.73 ml/m  RA Volume:   26.00 ml  12.06 ml/m LA Vol (A4C):   96.8 ml  44.90 ml/m LA Biplane Vol: 112.0 ml 51.95 ml/m  AORTIC VALVE AV Area (Vmax):    2.79 cm AV Area (Vmean):   2.84 cm AV Area (VTI):     2.96 cm AV Vmax:           136.00 cm/s AV Vmean:          92.600 cm/s AV VTI:  0.205 m AV Peak Grad:      7.4 mmHg AV Mean Grad:      4.0 mmHg LVOT Vmax:         91.30 cm/s LVOT Vmean:        63.200 cm/s LVOT VTI:          0.146 m LVOT/AV VTI ratio: 0.71  AORTA Ao Root diam: 3.40 cm TRICUSPID VALVE TR Peak grad:   34.3 mmHg TR Vmax:        293.00 cm/s  SHUNTS Systemic VTI:  0.15 m Systemic Diam: 2.30 cm Mertie Moores MD Electronically signed by Mertie Moores MD Signature Date/Time: 10/15/2020/4:41:41 PM    Final         Scheduled Meds:  influenza vaccine adjuvanted  0.5 mL Intramuscular Tomorrow-1000   lisinopril  10 mg Oral Daily   metoprolol succinate  100 mg Oral Daily   sodium chloride flush  3 mL Intravenous Q12H   warfarin  5 mg Oral ONCE-1600   Warfarin - Pharmacist Dosing Inpatient   Does not apply q1600   Continuous Infusions:   LOS: 3 days    Time spent: 39 min   Georgette Shell, MD  10/17/2020, 1:35 PM

## 2020-10-17 NOTE — Consult Note (Addendum)
Renal Service Consult Note [facility]  MICHAI DIEPPA 10/17/2020 Sol Blazing, MD Requesting Physician: Dr. Rodena Piety  Reason for Consult: Hyponatremia HPI: The patient is a 87 y.o. year-old w/ hx of atrial fib, embolic CVA, venous stasis w/ hx of ulcers presented to ED after a fall at home. Pt had AMS, more than usual confusion, also had leg swelling. In ED pt had low SpO2 88% , CXR showed pulm edema. Pt was admitted for hypoxic resp failure and acute diast CHF exacerbation.  Pt was started on IV lasix 40 bid. Na+ was 127 on admission. Na+ dropped then to 125 and 121 today. Lasix was dc'd on 10/6. Asked to see for hyponatremia.   Pt seen in room, son at bedside. Pt poor historian. No sig SOB at rest, no CP. Son says legs are "better" than on admission.    ROS - denies CP, no joint pain, no HA, no blurry vision, no rash, no diarrhea, no nausea/ vomiting, no dysuria, no difficulty voiding   Past Medical History  Past Medical History:  Diagnosis Date   Atrial fibrillation ([place]) 10/15/2012   Cancer ([place])    Skin (R hand)   Embolic stroke ([place]) 75/91/6384   Essential hypertension 01/21/2014   Long term current use of anticoagulant therapy 12/25/2012   Past Surgical History  Past Surgical History:  Procedure Laterality Date   APPENDECTOMY     CARDIAC CATHETERIZATION     CYSTOSCOPY WITH INSERTION OF UROLIFT N/A 10/10/2017   Procedure: CYSTOSCOPY WITH INSERTION OF UROLIFT;  Surgeon: [name], MD;  Location: [facility] ORS;  Service: Urology;  Laterality: N/A;  NEEDS 60 MIN   EYE SURGERY Bilateral 2013   Cataracts   Family History  Family History  Problem Relation Age of Onset   Other Mother        Brain hemmorrhage   Other Father        unknown   Social History  reports that he has quit smoking. His smoking use included cigarettes. He has a 2.50 pack-year smoking history. He has never used smokeless tobacco. He reports that he does not drink alcohol and does not  use drugs. Allergies No Known Allergies Home medications Prior to Admission medications   Medication Sig Start Date End Date Taking? Authorizing Provider  acetaminophen (TYLENOL) 500 MG tablet Take 500-1,000 mg by mouth every 6 (six) hours as needed (FOR PAIN.).   Yes [provider]  albuterol (VENTOLIN HFA) 108 (90 Base) MCG/ACT inhaler Inhale 1 puff into the lungs every 4 (four) hours as needed for wheezing or shortness of breath. 07/02/20  Yes [provider]  aspirin EC 81 MG tablet Take 81 mg by mouth at bedtime.   Yes [provider]  calcium carbonate (OS-CAL) 600 MG TABS tablet Take 600 mg by mouth daily.    Yes [provider]  Glucosamine HCl (GLUCOSAMINE PO) Take 1,500 mg by mouth daily.    Yes [provider]  hydrochlorothiazide (HYDRODIURIL) 12.5 MG tablet Take 1 tablet (12.5 mg total) by mouth daily. 07/03/17  Yes Belva Crome, MD  lisinopril (PRINIVIL,ZESTRIL) 10 MG tablet Take 10 mg by mouth daily.   Yes [provider]  metoprolol succinate (TOPROL-XL) 25 MG 24 hr tablet TAKE 1 TABLET BY MOUTH DAILY WITH 50 MG TABLET FOR TOTAL DAILY DOSE OF 75 MG. TAKE WITH OR IMMEDIATELY FOLLOWING A MEAL. Please make overdue appt for future refills Thank you Patient taking differently: Take 25 mg by  mouth daily. Along with 50 mg tab 09/28/20  Yes Belva Crome, MD  metoprolol succinate (TOPROL-XL) 50 MG 24 hr tablet Take 1 tablet (50 mg total) by mouth daily. Along with a 25mg  tablet for a total of 75mg . Take with or immediately following a meal. Patient taking differently: Take 50 mg by mouth daily. Along with 25 mg tab 01/24/19  Yes Belva Crome, MD  Multiple Vitamin (MULTIVITAMIN) tablet Take 1 tablet by mouth daily.   Yes [provider]  Multiple Vitamins-Minerals (PRESERVISION AREDS 2) CAPS Take 1 capsule by mouth 2 (two) times daily.   Yes [provider]  traMADol (ULTRAM) 50 MG tablet Take 1 tablet (50 mg total) by  mouth every 6 (six) hours as needed. Patient taking differently: Take 50 mg by mouth every 6 (six) hours as needed for moderate pain. 08/31/20  Yes Fisher, Linden Dolin, PA-C  warfarin (COUMADIN) 5 MG tablet TAKE AS DIRECTED PER ANTICOAGULATION CLINIC Patient taking differently: Take 5-7.5 mg by mouth See admin instructions. 7.5 mg on Sunday 5 mg Monday,Tuesday,Wednesday,Thursday,Friday,saturday 09/15/20  Yes Bhagat, Bhavinkumar, PA  lidocaine (LIDODERM) 5 % Place 1 patch onto the skin every 12 (twelve) hours. Remove & Discard patch within 12 hours or as directed by MD Patient not taking: No sig reported 08/31/20 08/31/21  Versie Starks, PA-C     Vitals:   10/16/20 2334 10/17/20 0359 10/17/20 0744 10/17/20 1218  BP: 135/77 (!) 151/85 (!) 145/90 119/76  Pulse: 88 84 97 85  Resp: 17 16 16 19   Temp: 98.2 F (36.8 C) 98.6 F (37 C) 97.7 F (36.5 C) 97.8 F (36.6 C)  TempSrc: Oral Oral Oral Oral  SpO2: 98% 92% 97% 98%  Weight:  98.4 kg     Exam Gen alert, no distress, n asal O2 No rash, cyanosis or gangrene Sclera anicteric, throat clear  + jvd or bruits Chest bilat rales 1/3 to 1/2 up   RRR no MRG Abd soft ntnd no mass or ascites +bs GU normal MS no joint effusions or deformity Ext 1+ pretib edema, no wounds or ulcers Neuro is alert, Ox 3 , nf     Home meds include - hctz 12.5 qd, lisinopril, metoprolol xl 75 qd, coumadin as directed    UA 10/4 - prot 100, > 50 rbc, 0-5 wbc, no bact   UNa 89, UOsm 328   Na 121  K 3.7  CO2 31  BUN 14  cr 0.83  Ca 8.3  Alb 3.2  LFT's ok      WBC 8K Hb 13      CXR 10/5 - bilat IS pulm edema    CXR 10/8 - decreased diffuse patchy hazy and linear opacities in both lungs, compatible with improving pulmonary edema     I/O -  840 cc in / 3.6 L out = neg 2.8 L     Wts 10/6 97.9 > 98.4 today  Assessment/ Plan: Hyponatremia - hypervolemic in pt w/ decompensated CHF and pulm edema. SP IV lasix diuresed x 2 days and Na+ improved initially then dropped to 121  today. Still wet on exam and by f/u CXR today. Will resume IV lasix, ^ to 40 tid. Restrict fluid intake. Will hold ACEi for now while diuresing. Ok to cont metoprolol. Alkalosis on bmet is compensation due to hypercarbia, not vol depletion. Will follow.  HTN - cont metoprolol, BP's are okay Acute diastolic CHF/ acute hypoxic resp failure - w/ pulm edema  Dementia  Rob Mica Releford  MD 10/17/2020, 3:16 PM  Recent Labs  Lab 10/13/20 1607 10/14/20 1437 10/15/20 0129  WBC 8.9  --  8.7  HGB 13.6 15.6 13.6   Recent Labs  Lab 10/16/20 0158 10/17/20 0140  K 3.5 3.7  BUN 15 14  CREATININE 1.01 0.83  CALCIUM 8.6* 8.4*

## 2020-10-17 NOTE — Progress Notes (Signed)
[facility] for warfarin Indication: atrial fibrillation  No Known Allergies  Patient Measurements: Weight: 98.4 kg (216 lb 14.9 oz) Heparin Dosing Weight: n/a  Vital Signs: Temp: 97.7 F (36.5 C) (10/08 0744) Temp Source: Oral (10/08 0744) BP: 145/90 (10/08 0744) Pulse Rate: 97 (10/08 0744)  Labs: Recent Labs    10/14/20 1426 10/14/20 1437 10/15/20 0129 10/16/20 0158 10/16/20 0437 10/16/20 0912 10/16/20 1203 10/17/20 0140  HGB  --  15.6 13.6  --   --   --   --   --   HCT  --  46.0 40.9  --   --   --   --   --   PLT  --   --  PLATELET CLUMPS NOTED ON SMEAR, UNABLE TO ESTIMATE  --   --   --   --   --   LABPROT  --   --  26.1*  --  28.6*  --   --  27.3*  INR  --   --  2.4*  --  2.7*  --   --  2.5*  CREATININE  --   --  0.86 1.01  --   --   --  0.83  CKTOTAL 413*  --   --   --   --   --   --   --   TROPONINIHS  --   --   --   --   --  17 17  --      Estimated Creatinine Clearance: 72.4 mL/min (by C-G formula based on SCr of 0.83 mg/dL).    Assessment: 63 yoM on warfarin PTA for afib presenting s/p fall. Imaging negative for bleed. Pharmacy consulted to dose warfarin inpatient. INR is therapeutic at 2.5. Continue home regimen for now.   PTA warfarin dose: 5mg  daily except 7.5mg  on Mon (last dose 10/4 PTA)  Goal of Therapy:  INR 2-3 Monitor platelets by anticoagulation protocol: Yes   Plan:  Warfarin 5mg  PO x1 tonight Daily INR  Thank you for allowing pharmacy to participate in this patient's care.  Reatha Harps, PharmD PGY1 Pharmacy Resident 10/17/2020 8:29 AM Check [email or web address] for unit specific pharmacy number

## 2020-10-18 ENCOUNTER — Inpatient Hospital Stay (HOSPITAL_COMMUNITY): Payer: Medicare Other

## 2020-10-18 DIAGNOSIS — Z7189 Other specified counseling: Secondary | ICD-10-CM

## 2020-10-18 DIAGNOSIS — Z515 Encounter for palliative care: Secondary | ICD-10-CM

## 2020-10-18 LAB — BLOOD GAS, ARTERIAL
Acid-Base Excess: 7.2 mmol/L — ABNORMAL HIGH (ref 0.0–2.0)
Bicarbonate: 31.8 mmol/L — ABNORMAL HIGH (ref 20.0–28.0)
Drawn by: 53309
FIO2: 21
O2 Saturation: 91.9 %
Patient temperature: 37
pCO2 arterial: 50 mmHg — ABNORMAL HIGH (ref 32.0–48.0)
pH, Arterial: 7.419 (ref 7.350–7.450)
pO2, Arterial: 62.3 mmHg — ABNORMAL LOW (ref 83.0–108.0)

## 2020-10-18 LAB — CBC
HCT: 42.9 % (ref 39.0–52.0)
Hemoglobin: 14.3 g/dL (ref 13.0–17.0)
MCH: 28.8 pg (ref 26.0–34.0)
MCHC: 33.3 g/dL (ref 30.0–36.0)
MCV: 86.3 fL (ref 80.0–100.0)
Platelets: UNDETERMINED 10*3/uL (ref 150–400)
RBC: 4.97 MIL/uL (ref 4.22–5.81)
RDW: 13.1 % (ref 11.5–15.5)
WBC: 8.7 10*3/uL (ref 4.0–10.5)
nRBC: 0 % (ref 0.0–0.2)

## 2020-10-18 LAB — BASIC METABOLIC PANEL
Anion gap: 7 (ref 5–15)
BUN: 17 mg/dL (ref 8–23)
CO2: 33 mmol/L — ABNORMAL HIGH (ref 22–32)
Calcium: 8.7 mg/dL — ABNORMAL LOW (ref 8.9–10.3)
Chloride: 84 mmol/L — ABNORMAL LOW (ref 98–111)
Creatinine, Ser: 0.95 mg/dL (ref 0.61–1.24)
GFR, Estimated: >60 mL/min (ref >60)
Glucose, Bld: 116 mg/dL — ABNORMAL HIGH (ref 70–99)
Potassium: 3.9 mmol/L (ref 3.5–5.1)
Sodium: 124 mmol/L — ABNORMAL LOW (ref 135–145)

## 2020-10-18 LAB — OSMOLALITY: Osmolality: 270 mOsm/kg — ABNORMAL LOW (ref 275–295)

## 2020-10-18 LAB — PROTIME-INR
INR: 2.9 — ABNORMAL HIGH (ref 0.8–1.2)
Prothrombin Time: 30 seconds — ABNORMAL HIGH (ref 11.4–15.2)

## 2020-10-18 LAB — MAGNESIUM: Magnesium: 2 mg/dL (ref 1.7–2.4)

## 2020-10-18 MED ORDER — WARFARIN SODIUM 5 MG PO TABS
5.0000 mg | ORAL_TABLET | Freq: Once | ORAL | Status: AC
  Administered 2020-10-18: 5 mg via ORAL
  Filled 2020-10-18: qty 1

## 2020-10-18 MED ORDER — FUROSEMIDE 10 MG/ML IJ SOLN
40.0000 mg | Freq: Two times a day (BID) | INTRAMUSCULAR | Status: DC
  Administered 2020-10-18 – 2020-10-21 (×6): 40 mg via INTRAVENOUS
  Filled 2020-10-18 (×6): qty 4

## 2020-10-18 NOTE — Progress Notes (Signed)
PROGRESS NOTE    [name]  KGM:[id number] DOB: Dec 08, 1930 DOA: 10/13/2020 PCP: Danny Arabian, MD    Brief Narrative: [name] is a 87 y.o. male with medical history significant of atrial fibrillation on chronic anticoagulation, embolic CVA, and venous stasis ulcers who presents after having an unwitnessed fall yesterday morning at around 4 AM.  Patient is unable to give history at this time as he is altered and obtained from his son.  Patient was found on the floor and thought that he likely fell backwards.  His son was able to help pick him up, but the patient was complaining of left shoulder and right leg pain.  His son notes that at some point they found some blood present in his stool when he had a bowel movement, but when they wiped him they did not notice any further blood.  Upon further questioning it appears the patient is 4 days and has been more weak.  At baseline patient poor recent memory, but was more confused than normal and not oriented to place or time.  Reported that it seemed like he was hallucinating complaining of the floor moving and saying things like "want to play this game" that did not make sense.  Associated symptoms include swelling of his leg and he has been followed by wound care for venous stasis ulcers.  While in the emergency department patient fell asleep twice and had shaking of his arms that lasted briefly.  No reports of tongue biting, incontinence of urine, or fevers.  He is not on oxygen at baseline and he quit smoking several years ago.   ED Course: Upon admission into the emergency department patient was noted to be afebrile, pulse 81-1 06, respirations 16-21, blood pressures maintained, and O2 saturations as low as 87% on room air with improvement on 2 L of oxygen.  CT scan of the head and cervical spine did not note any acute fracture or intercranial abnormality.  Labs since 10/4 significant for platelet count noted, sodium 127, CO2 29, chloride 88, AST  43, total bilirubin 1.5, BNP 141.1.  Chest x-ray noted stable cardiac silhouette with concern for interstitial edema pattern.  CT scan of the pelvis was significant for soft tissues contusion or hematoma overlying the posterior lateral right hip.  Noted urinalysis was positive for small hemoglobin and greater than 50 RBCs.  Assessment & Plan:   Principal Problem:   Acute respiratory failure with hypoxia (HCC) Active Problems:   Atrial fibrillation (HCC)   Long term current use of anticoagulant therapy   Essential hypertension   Fall at home, initial encounter   Hyponatremia   Hyperbilirubinemia   Acute diastolic CHF (congestive heart failure) ([place])    #1 acute hypoxic respiratory failure secondary to acute diastolic heart failure exacerbation. Echo ejection fraction 60 to 65%.   Patient was hypoxic at 87% on room air on admission with 2+ bilateral pitting edema and chest x-ray consistent with pulmonary edema and elevated BNP. Lasix restarted 10/17/2020 with improvement in his symptoms Chest x-ray 10/18/2020 with improved pulmonary infiltrates and decreased right basilar atelectasis. Encourage incentive spirometer  #2 acute metabolic encephalopathy likely from hypoxia secondary to #1.  Patient lives alone at home however his son lives at the back of the house and keeps an eye on him. TSH normal Ammonia not elevated EEG no seizures or epileptiform discharges normal study.  #3chronic afib on coumadin INR 2.9  #4 fall at home x-rays of the hip shows no acute  fracture PT recommending SNF.  #5 hypervolemic hyponatremia -likely multifactorial, improving since restarting Lasix. Seizure precautions  He was on HCTZ at home which has been stopped  Normal TSH  Urine osm normal Serum osm low normal  Avoid hypotonic fluids Repeat labs in a.m.  #6 history of essential hypertension continue current meds  #7 chronic venous stasis dermatitis with 2 open wound on the right lower extremity  seen by wound care nurse.  Secure with foam dressing Wrap the right leg with Kerlix and Coban change 3 times a week.    Estimated body mass index is 29.77 kg/m as calculated from the following:   Height as of 08/31/20: 5\' 10"  (1.778 m).   Weight as of this encounter: 94.1 kg.  DVT prophylaxis: coumadin Code Status:full Family Communication: dw son Disposition Plan:  Status is: Inpatient  Remains inpatient appropriate because:IV treatments appropriate due to intensity of illness or inability to take PO  Dispo: The patient is from: Home              Anticipated d/c is to: Home              Patient currently is not medically stable to d/c.   Difficult to place patient No   Consultants:  cardio  Procedures: none Antimicrobials: None  Subjective:   He is resting in bed he was talking with his wife on the phone when I walked into the room his son is by the bedside he reports he had a better night His breathing is better edema is better objective: Vitals:   10/17/20 2035 10/18/20 0001 10/18/20 0328 10/18/20 0739  BP: (!) 146/69 (!) 139/93 121/75 112/63  Pulse: 79 100 92 99  Resp: 20 19 15 20   Temp: 98.3 F (36.8 C) (!) 97.5 F (36.4 C) 98.3 F (36.8 C) 98 F (36.7 C)  TempSrc: Oral Oral Oral Oral  SpO2: 97% 97% 94% 94%  Weight:   94.1 kg     Intake/Output Summary (Last 24 hours) at 10/18/2020 1106 Last data filed at 10/18/2020 0900 Gross per 24 hour  Intake 820 ml  Output 4380 ml  Net -3560 ml    Filed Weights   10/16/20 0257 10/17/20 0359 10/18/20 0328  Weight: 98.2 kg 98.4 kg 94.1 kg    Examination:  General exam: Appears calm and comfortable  Respiratory system: Crackles at the bases to auscultation. Respiratory effort normal. Cardiovascular system: S1 & S2 heard, RRR. No JVD, murmurs, rubs, gallops or clicks. No pedal edema. Gastrointestinal system: Abdomen is nondistended, soft and nontender. No organomegaly or masses felt. Normal bowel sounds  heard. Central nervous system: Alert and oriented. No focal neurological deficits. Extremities: 2+ bilateral pitting edema  skin: Right lower extremity to open wounds Psychiatry: Judgement and insight appear normal. Mood & affect appropriate.     Data Reviewed: I have personally reviewed following labs and imaging studies  CBC: Recent Labs  Lab 10/13/20 1607 10/14/20 1437 10/15/20 0129 10/18/20 0106  WBC 8.9  --  8.7 8.7  NEUTROABS 7.0  --   --   --   HGB 13.6 15.6 13.6 14.3  HCT 40.6 46.0 40.9 42.9  MCV 87.7  --  86.5 86.3  PLT PLATELET CLUMPS NOTED ON SMEAR, UNABLE TO ESTIMATE  --  PLATELET CLUMPS NOTED ON SMEAR, UNABLE TO ESTIMATE PLATELET CLUMPS NOTED ON SMEAR, UNABLE TO ESTIMATE    Basic Metabolic Panel: Recent Labs  Lab 10/13/20 1607 10/14/20 1437 10/15/20 0129 10/16/20 0158 10/17/20  0140 10/18/20 0106  NA 127* 130* 125* 122* 121* 124*  K 3.7 4.0 3.7 3.5 3.7 3.9  CL 88*  --  88* 85* 84* 84*  CO2 29  --  29 30 31  33*  GLUCOSE 110*  --  111* 115* 110* 116*  BUN 11  --  11 15 14 17   CREATININE 0.93  --  0.86 1.01 0.83 0.95  CALCIUM 8.9  --  8.7* 8.6* 8.4* 8.7*  MG  --   --   --  1.7 1.9 2.0    GFR: Estimated Creatinine Clearance: 61.9 mL/min (by C-G formula based on SCr of 0.95 mg/dL). Liver Function Tests: Recent Labs  Lab 10/14/20 0716 10/15/20 0129  AST 43* 41  ALT 27 24  ALKPHOS 54 56  BILITOT 1.5* 1.8*  PROT <3.0* 7.4  ALBUMIN 3.5 3.2*    No results for input(s): LIPASE, AMYLASE in the last 168 hours. Recent Labs  Lab 10/14/20 1426  AMMONIA 23    Coagulation Profile: Recent Labs  Lab 10/14/20 0716 10/15/20 0129 10/16/20 0437 10/17/20 0140 10/18/20 0106  INR 2.5* 2.4* 2.7* 2.5* 2.9*    Cardiac Enzymes: Recent Labs  Lab 10/14/20 1426  CKTOTAL 413*    BNP (last 3 results) No results for input(s): PROBNP in the last 8760 hours. HbA1C: No results for input(s): HGBA1C in the last 72 hours. CBG: No results for input(s): GLUCAP  in the last 168 hours. Lipid Profile: Recent Labs    10/15/20 1302  CHOL 137  HDL 45  LDLCALC 75  TRIG 87  CHOLHDL 3.0    Thyroid Function Tests: No results for input(s): TSH, T4TOTAL, FREET4, T3FREE, THYROIDAB in the last 72 hours.  Anemia Panel: No results for input(s): VITAMINB12, FOLATE, FERRITIN, TIBC, IRON, RETICCTPCT in the last 72 hours. Sepsis Labs: Recent Labs  Lab 10/14/20 1942  PROCALCITON <0.10     Recent Results (from the past 240 hour(s))  Resp Panel by RT-PCR (Flu A&B, Covid) Nasopharyngeal Swab     Status: None   Collection Time: 10/14/20 12:42 PM   Specimen: Nasopharyngeal Swab; Nasopharyngeal(NP) swabs in vial transport medium  Result Value Ref Range Status   SARS Coronavirus 2 by RT PCR NEGATIVE NEGATIVE Final    Comment: (NOTE) SARS-CoV-2 target nucleic acids are NOT DETECTED.  The SARS-CoV-2 RNA is generally detectable in upper respiratory specimens during the acute phase of infection. The lowest concentration of SARS-CoV-2 viral copies this assay can detect is 138 copies/mL. A negative result does not preclude SARS-Cov-2 infection and should not be used as the sole basis for treatment or other patient management decisions. A negative result may occur with  improper specimen collection/handling, submission of specimen other than nasopharyngeal swab, presence of viral mutation(s) within the areas targeted by this assay, and inadequate number of viral copies(<138 copies/mL). A negative result must be combined with clinical observations, patient history, and epidemiological information. The expected result is Negative.  Fact Sheet for Patients:  EntrepreneurPulse.com.au  Fact Sheet for Healthcare Providers:  IncredibleEmployment.be  This test is no t yet approved or cleared by the [place] FDA and  has been authorized for detection and/or diagnosis of SARS-CoV-2 by FDA under an Emergency Use  Authorization (EUA). This EUA will remain  in effect (meaning this test can be used) for the duration of the COVID-19 declaration under Section 564(b)(1) of the Act, 21 U.S.C.section 360bbb-3(b)(1), unless the authorization is terminated  or revoked sooner.       Influenza  A by PCR NEGATIVE NEGATIVE Final   Influenza B by PCR NEGATIVE NEGATIVE Final    Comment: (NOTE) The Xpert Xpress SARS-CoV-2/FLU/RSV plus assay is intended as an aid in the diagnosis of influenza from Nasopharyngeal swab specimens and should not be used as a sole basis for treatment. Nasal washings and aspirates are unacceptable for Xpert Xpress SARS-CoV-2/FLU/RSV testing.  Fact Sheet for Patients: EntrepreneurPulse.com.au  Fact Sheet for Healthcare Providers: IncredibleEmployment.be  This test is not yet approved or cleared by the [place] FDA and has been authorized for detection and/or diagnosis of SARS-CoV-2 by FDA under an Emergency Use Authorization (EUA). This EUA will remain in effect (meaning this test can be used) for the duration of the COVID-19 declaration under Section 564(b)(1) of the Act, 21 U.S.C. section 360bbb-3(b)(1), unless the authorization is terminated or revoked.  Performed at [facility], [place] [address], [place], [place] [address]   Culture, blood (routine x 2)     Status: None (Preliminary result)   Collection Time: 10/14/20  9:25 PM   Specimen: BLOOD  Result Value Ref Range Status   Specimen Description BLOOD RIGHT ANTECUBITAL  Final   Special Requests   Final    BOTTLES DRAWN AEROBIC AND ANAEROBIC Blood Culture adequate volume   Culture   Final    NO GROWTH 4 DAYS Performed at [facility], [place] [address] [place], [place] [address]    Report Status PENDING  Incomplete  Culture, blood (routine x 2)     Status: None (Preliminary result)   Collection Time: 10/14/20  9:25 PM   Specimen: BLOOD RIGHT HAND  Result Value Ref  Range Status   Specimen Description BLOOD RIGHT HAND  Final   Special Requests   Final    BOTTLES DRAWN AEROBIC AND ANAEROBIC Blood Culture adequate volume   Culture   Final    NO GROWTH 4 DAYS Performed at [facility], [place] [address] [place], [place] [address]    Report Status PENDING  Incomplete          Radiology Studies: DG Chest 1 View  Result Date: 10/17/2020 CLINICAL DATA:  Edema EXAM: CHEST  1 VIEW COMPARISON:  10/14/2020 chest radiograph. FINDINGS: Low lung volumes. Stable cardiomediastinal silhouette with mild cardiomegaly. No pneumothorax. Blunting of the right costophrenic angle, similar, favor atelectasis. No left pleural effusion. Diffuse patchy hazy and linear opacities in both lungs, improved. Mild platelike right lung base atelectasis, similar. IMPRESSION: 1. Low lung volumes. 2. Stable mild cardiomegaly. Decreased diffuse patchy hazy and linear opacities in both lungs, compatible with improving pulmonary edema. 3. Mild platelike right lung base atelectasis. Electronically Signed   By: Ilona Sorrel M.D.   On: 10/17/2020 10:33   DG CHEST PORT 1 VIEW  Result Date: 10/18/2020 CLINICAL DATA:  Shortness of breath EXAM: PORTABLE CHEST 1 VIEW COMPARISON:  Portable exam 0742 hours compared to 10/17/2020 FINDINGS: Enlargement of cardiac silhouette. Atherosclerotic calcification aorta. Diffuse interstitial infiltrates, improved. Residual RIGHT basilar atelectasis. No pneumothorax or acute osseous findings. Old healed RIGHT midclavicular fracture with deformity. IMPRESSION: Improved pulmonary infiltrates and decreased RIGHT basilar atelectasis. Enlargement of cardiac silhouette. Aortic Atherosclerosis (ICD10-I70.0). Electronically Signed   By: Lavonia Dana M.D.   On: 10/18/2020 09:43        Scheduled Meds:  furosemide  40 mg Intravenous Q12H   metoprolol succinate  100 mg Oral Daily   sodium chloride flush  3 mL Intravenous Q12H   warfarin  5 mg  Oral ONCE-1600    Warfarin - Pharmacist Dosing Inpatient   Does not apply q1600   Continuous Infusions:   LOS: 4 days    Time spent: 39 min   Georgette Shell, MD  10/18/2020, 11:06 AM

## 2020-10-18 NOTE — Consult Note (Signed)
Palliative Medicine Inpatient Consult Note  Consulting Provider: Georgette Shell, MD  Reason for consult:   [facility] Palliative Medicine Consult  Reason for Consult? goals of care   HPI:  Per intake H&P --> [name] is a 87 y.o. male with medical history significant of atrial fibrillation on chronic anticoagulation, embolic CVA, and venous stasis ulcers who presents after having an unwitnessed fall yesterday morning at around 4 AM.  Patient is unable to give history at this time as he is altered and obtained from his son.  Patient was found on the floor and thought that he likely fell backwards.  His son was able to help pick him up, but the patient was complaining of left shoulder and right leg pain.  His son notes that at some point they found some blood present in his stool when he had a bowel movement, but when they wiped him they did not notice any further blood.  Upon further questioning it appears the patient is 4 days and has been more weak.  At baseline patient poor recent memory, but was more confused than normal and not oriented to place or time.  Reported that it seemed like he was hallucinating complaining of the floor moving and saying things like "want to play this game" that did not make sense.  Associated symptoms include swelling of his leg and he has been followed by wound care for venous stasis ulcers.  While in the emergency department patient fell asleep twice and had shaking of his arms that lasted briefly.  No reports of tongue biting, incontinence of urine, or fevers.  He is not on oxygen at baseline and he quit smoking several years ago.  Palliative care has been asked to get involved to aid in goals of care conversations.   Clinical Assessment/Goals of Care:  *Please note that this is a verbal dictation therefore any spelling or grammatical errors are due to the "[place]" system interpretation.  I have reviewed medical records  including EPIC notes, labs and imaging, received report from bedside RN, assessed the patient who is resting comfortably eating his lunch.    I met with [name] and his son, [name] to further discuss diagnosis prognosis, GOC, EOL wishes, disposition and options.  We reviewed via the chart [name]'s co-morbid conditions and the significant of hyponatremia in the setting of heart failure. We discussed [name]'s Afib, CVA, and venous stasis ulcers.    I introduced Palliative Medicine as specialized medical care for people living with serious illness. It focuses on providing relief from the symptoms and stress of a serious illness. The goal is to improve quality of life for both the patient and the family.  [name] is from [place] originally. He has been married for the past 39 years. He has three sons and four grandchildren. 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  He enjoys spending time with his family. He is a member of [employer] and has strong faith.  Prior to admission, [name] had been living in a single family home with his spouse. His son, [name] lives in the home behind his and provides supportive care. He was able to get up through the help of a recliner/lift chair and mobilized with a rollator. He could perform most bADL's on his own though his son would sit at shower-side as he bathed.   I reviewed with patients son the progressive stages of heart failure in accordance with  NYHA classification system. Reviewed the connection between the cardiovascular and renal systems. 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  A detailed discussion was had today regarding advanced directives, patient and his spouse have never completed these therefore a packet was provided for review and completion.    Concepts specific to code status, artifical feeding and hydration, continued IV antibiotics and rehospitalization was had. We discussed that cardiopulmonary  resuscitation is simply not "brining someone back" we reviewed the various benefits vs. Risks associated with its actions in an older gentleman with other chronic conditions. I provided a MOST form and Hard choices for loving people booklet. Patients son shares they will go over this together. [name] himself has mixed emotions as his wife died of a cardiac arrest last year and his mother rescinded her own DNR after starting hemodialysis.   We had a conversation about present goals which are to optimize strength. I asked [name] to get OOB with me which he was able to do with 2P assistance. He appears to have a very weak trunk and LE's. I asked patients son if he has considered rehabilitation which he shares that he has considered. I expressed my concern about [place] caring for [place] and his mother at home as presently I believe [name] will require total care. He stated understanding of this.   Discussed the importance of continued conversation with family and their  medical providers regarding overall plan of care and treatment options, ensuring decisions are within the context of the patients values and GOCs.  Decision Maker: [name] (son) [phone]  SUMMARY OF RECOMMENDATIONS   Full Code / Full scope of care  A MOST form was provided as well as a Hard Choices for [employer] book  Education on CHF and it's progression was provided  Education on Palliative care and supportive services were provided  [facility] - OP Palliative support for ongoing goals of care conversation  Muscular Weakness - Patient may benefit from SNF placement  Incremental PMT support  Code Status/Advance Care Planning: FULL CODE  Palliative Prophylaxis:  Oral Care, Mobility  Additional Recommendations (Limitations, Scope, Preferences): Continue current scope of care   Psycho-social/Spiritual:  Desire for further Chaplaincy support: Yes Additional Recommendations:    Prognosis: Unclear (+)1 ER visit, (+)1 IP  admission, multiple co-morbidities placed at an increased 70-monthmortality risk  Discharge Planning: Discharge plan is yet to be determined  Vitals:   10/18/20 0739 10/18/20 1142  BP: 112/63 110/67  Pulse: 99 68  Resp: 20 20  Temp: 98 F (36.7 C) 98.1 F (36.7 C)  SpO2: 94% 95%    Intake/Output Summary (Last 24 hours) at 10/18/2020 1258 Last data filed at 10/18/2020 0900 Gross per 24 hour  Intake 820 ml  Output 4380 ml  Net -3560 ml   Last Weight  Most recent update: 10/18/2020  3:31 AM    Weight  94.1 kg (207 lb 7.3 oz)            Gen: Elderly Caucasian M in NAD HEENT: moist mucous membranes CV: Regular rate and irregular rhythm  PULM: On 2LPM [place] ABD: soft/nontender  EXT: (+) pedal edema Neuro: Alert and oriented x2  PPS: 50%   This conversation/these recommendations were discussed with patient primary care team, Dr. MRodena Piety Time In: 1210 Time Out: 1320 Total Time: 70 Greater than 50%  of this time was spent counseling and coordinating care related to the above assessment and plan.  [place] [place]  Team Cell Phone: [phone] Please utilize secure chat with additional questions, if there is no response within 30 minutes please call the above phone number  Palliative Medicine Team providers are available by phone from 7am to 7pm daily and can be reached through the team cell phone.  Should this patient require assistance outside of these hours, please call the patient's attending physician.

## 2020-10-18 NOTE — Progress Notes (Signed)
[facility] Progress Note  Subjective: pt seen in room, no c/o's. Breathing better at night per the son, was not as restless last night. CXR today continues to show improvement in edema. I/O 300 in and 3000 UOP yesterday.  Na up 124, creat stable 0.95, CO2 33 up a bit.   ABG 7.42/ 50/ 62.   Vitals:   10/17/20 2035 10/18/20 0001 10/18/20 0328 10/18/20 0739  BP: (!) 146/69 (!) 139/93 121/75 112/63  Pulse: 79 100 92 99  Resp: 20 19 15 20   Temp: 98.3 F (36.8 C) (!) 97.5 F (36.4 C) 98.3 F (36.8 C) 98 F (36.7 C)  TempSrc: Oral Oral Oral Oral  SpO2: 97% 97% 94% 94%  Weight:   94.1 kg     Exam:  alert, nad , elderly WM ,HOH   no jvd  Chest cta bilat today, rales mostly resolved  Cor reg no RG  Abd soft ntnd no ascites   Ext + pretib edema R > L improving   Alert, NF, ox3       Home meds include - hctz 12.5 qd, lisinopril, metoprolol xl 75 qd, coumadin as directed     UA 10/4 - prot 100, > 50 rbc, 0-5 wbc, no bact   UNa 89, UOsm 328   Na 121  K 3.7  CO2 31  BUN 14  cr 0.83  Ca 8.3  Alb 3.2  LFT's ok      WBC 8K Hb 13      CXR 10/5 - bilat IS pulm edema    CXR 10/8 - decreased diffuse patchy hazy and linear opacities in both lungs, compatible with improving pulmonary edema     CXR 10/9 - edema continues to improve    Assessment/ Plan: Hyponatremia - due to hypervolemia in pt w/ a/c diast CHF. Na+ up today 124. CXR/ exam shows vol overload still present but improving daily. Holding acei will diuresing. Will cont IV lasix lower to 40 bid, prob needs another 24 hrs or so of IV lasix. Will follow.   A/C diast CHF - this severity of hyponatremia can be poor prognostic sign w/ regards to CHF. Have d/w son and w/ pmd.  HTN - cont metoprolol, acei on hold, BP's are okay Dementia  Metabolic alkalosis - due to CO2 retention, prob OSA or OHS. Serum bicarb was up on admission at 29 which is compensatory for the hypercarbia/ resp acidosis. Here it is up to 33 w/ diuresis  which is expected.       Rob Mansel Strother 10/18/2020, 10:39 AM   Recent Labs  Lab 10/15/20 0129 10/16/20 0158 10/17/20 0140 10/18/20 0106  K 3.7   < > 3.7 3.9  BUN 11   < > 14 17  CREATININE 0.86   < > 0.83 0.95  CALCIUM 8.7*   < > 8.4* 8.7*  HGB 13.6  --   --  14.3   < > = values in this interval not displayed.   Inpatient medications:  furosemide  40 mg Intravenous Q8H   metoprolol succinate  100 mg Oral Daily   sodium chloride flush  3 mL Intravenous Q12H   warfarin  5 mg Oral ONCE-1600   Warfarin - Pharmacist Dosing Inpatient   Does not apply q1600    acetaminophen **OR** acetaminophen, albuterol, diclofenac Sodium, metoprolol tartrate

## 2020-10-18 NOTE — Progress Notes (Signed)
Mobility Specialist: Progress Note   10/18/20 1323  Mobility  Activity Transferred to/from BSC;Sat and stood x 3  Level of Assistance +2 (takes two people)  Secondary school teacher Out of bed to chair with meals  Mobility Response Tolerated well  Mobility performed by Mobility specialist  Bed Position Chair  $Mobility charge 1 Mobility   Post-Mobility: 93 HR  Pt on BSC upon entering room, BM unsuccessful. Pt assisted to the chair using [name]. Once in the chair pt performed sit to stand x3 for 1-2 minutes each bout. Pt sitting in the chair with call bell in his lap and family member present in the room.   [facility] [name] Mobility Specialist Mobility Specialist Phone: [phone]

## 2020-10-18 NOTE — Progress Notes (Signed)
ANTICOAGULATION CONSULT NOTE   Pharmacy Consult for warfarin Indication: atrial fibrillation  No Known Allergies  Patient Measurements: Weight: 94.1 kg (207 lb 7.3 oz) Heparin Dosing Weight: n/a  Vital Signs: Temp: 98 F (36.7 C) (10/09 0739) Temp Source: Oral (10/09 0739) BP: 112/63 (10/09 0739) Pulse Rate: 99 (10/09 0739)  Labs: Recent Labs    10/16/20 0158 10/16/20 0437 10/16/20 0912 10/16/20 1203 10/17/20 0140 10/18/20 0106  HGB  --   --   --   --   --  14.3  HCT  --   --   --   --   --  42.9  PLT  --   --   --   --   --  PLATELET CLUMPS NOTED ON SMEAR, UNABLE TO ESTIMATE  LABPROT  --  28.6*  --   --  27.3* 30.0*  INR  --  2.7*  --   --  2.5* 2.9*  CREATININE 1.01  --   --   --  0.83 0.95  TROPONINIHS  --   --  17 17  --   --      Estimated Creatinine Clearance: 61.9 mL/min (by C-G formula based on SCr of 0.95 mg/dL).    Assessment: 42 yoM on warfarin PTA for afib presenting s/p fall. Imaging negative for bleed. Pharmacy consulted to dose warfarin inpatient. INR is upper end of therapeutic at 2.9 today, however INR has been stable on 5 mg since 10/5. Will continue with 5 mg today based on trend. CBC stable with no reported bleeding.   PTA warfarin dose: 5mg  daily except 7.5mg  on Mon (last dose 10/4 PTA)  Goal of Therapy:  INR 2-3 Monitor platelets by anticoagulation protocol: Yes   Plan:  Warfarin 5mg  PO x1 tonight Daily INR  Thank you for allowing pharmacy to participate in this patient's care.  Reatha Harps, PharmD PGY1 Pharmacy Resident 10/18/2020 9:05 AM Check [email or web address] for unit specific pharmacy number

## 2020-10-19 LAB — BASIC METABOLIC PANEL
Anion gap: 8 (ref 5–15)
BUN: 20 mg/dL (ref 8–23)
CO2: 33 mmol/L — ABNORMAL HIGH (ref 22–32)
Calcium: 8.7 mg/dL — ABNORMAL LOW (ref 8.9–10.3)
Chloride: 84 mmol/L — ABNORMAL LOW (ref 98–111)
Creatinine, Ser: 0.97 mg/dL (ref 0.61–1.24)
GFR, Estimated: >60 mL/min (ref >60)
Glucose, Bld: 99 mg/dL (ref 70–99)
Potassium: 3.6 mmol/L (ref 3.5–5.1)
Sodium: 125 mmol/L — ABNORMAL LOW (ref 135–145)

## 2020-10-19 LAB — CULTURE, BLOOD (ROUTINE X 2)
Culture: NO GROWTH
Culture: NO GROWTH
Special Requests: ADEQUATE
Special Requests: ADEQUATE

## 2020-10-19 LAB — MAGNESIUM: Magnesium: 2 mg/dL (ref 1.7–2.4)

## 2020-10-19 LAB — CBC
HCT: 43.2 % (ref 39.0–52.0)
Hemoglobin: 14.1 g/dL (ref 13.0–17.0)
MCH: 28.5 pg (ref 26.0–34.0)
MCHC: 32.6 g/dL (ref 30.0–36.0)
MCV: 87.4 fL (ref 80.0–100.0)
Platelets: UNDETERMINED 10*3/uL (ref 150–400)
RBC: 4.94 MIL/uL (ref 4.22–5.81)
RDW: 13.2 % (ref 11.5–15.5)
WBC: 7.6 10*3/uL (ref 4.0–10.5)
nRBC: 0 % (ref 0.0–0.2)

## 2020-10-19 LAB — PROTIME-INR
INR: 3 — ABNORMAL HIGH (ref 0.8–1.2)
Prothrombin Time: 31 seconds — ABNORMAL HIGH (ref 11.4–15.2)

## 2020-10-19 MED ORDER — ACETAZOLAMIDE 250 MG PO TABS
250.0000 mg | ORAL_TABLET | Freq: Two times a day (BID) | ORAL | Status: AC
  Administered 2020-10-19 (×2): 250 mg via ORAL
  Filled 2020-10-19 (×2): qty 1

## 2020-10-19 MED ORDER — WARFARIN SODIUM 4 MG PO TABS
4.0000 mg | ORAL_TABLET | Freq: Once | ORAL | Status: AC
  Administered 2020-10-19: 4 mg via ORAL
  Filled 2020-10-19: qty 1

## 2020-10-19 NOTE — TOC Progression Note (Signed)
Transition of Care [facility]) - Progression Note    Patient Details  Name: [name] MRN: [id number] Date of Birth: [birth year]  [facility] CM/SW [place], [place] Phone Number: 10/19/2020, 2:42 PM  Clinical Narrative:     CSW spoke with patient's wife, although she expresses she remains undecided( HH vs SNF), CSW gave her current SNF bed offers. If she decides on SNF, [facility] her first choice and then [facility].   CSW will continue to follow and assist with discharge planning.   Thurmond Butts, MSW, LCSW Clinical Social Worker       Barriers to Discharge: Continued Medical Work up, SNF Pending bed offer  Expected Discharge Plan and Services   In-house Referral: Clinical Social Work     Living arrangements for the past 2 months: Single Family Home                                       Social Determinants of Health (SDOH) Interventions    Readmission Risk Interventions No flowsheet data found.

## 2020-10-19 NOTE — Progress Notes (Signed)
[name] for warfarin Indication: atrial fibrillation  No Known Allergies  Patient Measurements: Weight: 90.5 kg (199 lb 8.3 oz) Heparin Dosing Weight: n/a  Vital Signs: Temp: 97.6 F (36.4 C) (10/10 0816) Temp Source: Oral (10/10 0816) BP: 140/91 (10/10 0816) Pulse Rate: 100 (10/10 0816)  Labs: Recent Labs    10/16/20 1203 10/17/20 0140 10/18/20 0106 10/19/20 0119  HGB  --   --  14.3 14.1  HCT  --   --  42.9 43.2  PLT  --   --  PLATELET CLUMPS NOTED ON SMEAR, UNABLE TO ESTIMATE PLATELET CLUMPS NOTED ON SMEAR, UNABLE TO ESTIMATE  LABPROT  --  27.3* 30.0* 31.0*  INR  --  2.5* 2.9* 3.0*  CREATININE  --  0.83 0.95 0.97  TROPONINIHS 17  --   --   --      Estimated Creatinine Clearance: 59.6 mL/min (by C-G formula based on SCr of 0.97 mg/dL).    Assessment: 77 yoM on warfarin PTA for afib presenting s/p fall. Imaging negative for bleed. Pharmacy consulted to dose warfarin inpatient.   INR up to 3 today   PTA warfarin dose: 5mg  daily except 7.5mg  on Mon (last dose 10/4 PTA)  Goal of Therapy:  INR 2-3 Monitor platelets by anticoagulation protocol: Yes   Plan:  Warfarin 4mg  PO x1 tonight Daily INR  Thank you Anette Guarneri, PharmD 10/19/2020 9:19 AM Check [email or web address] for unit specific pharmacy number

## 2020-10-19 NOTE — Progress Notes (Signed)
Mobility Specialist: Progress Note   10/19/20 1506  Mobility  Activity Ambulated in hall  Level of Assistance +2 (takes two people)  Assistive Device Front wheel walker  Distance Ambulated (ft) 18 ft (16'+2')  Mobility Ambulated with assistance in hallway  Mobility Response Tolerated fair  Mobility performed by Mobility specialist  Bed Position Chair  $Mobility charge 1 Mobility   Pre-Mobility: 95 HR, 119/77 BP, 94% SpO2 During Mobility: 90% SpO2 Post-Mobility: 97 HR  Pt required modA to sit EOB from supine and +2 modA to stand from EOB. Pt with slow shuffle gait requiring verbal cues to take bigger steps during ambulation. Pt had no c/o during ambulation. Distance limited d/t pt needed to use BSC. Pt assisted to [facility] and then transferred to the chair with call bell in reach.   [facility] Loki Wuthrich Mobility Specialist Mobility Specialist Phone: [phone]

## 2020-10-19 NOTE — Care Management Important Message (Signed)
Important Message  Patient Details  Name: [name] MRN: [id number] Date of Birth: 10/16/1930   Medicare Important Message Given:  Yes     Shelda Altes 10/19/2020, 10:19 AM
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
## 2020-10-19 NOTE — Progress Notes (Signed)
PROGRESS NOTE    PAXTEN APPELT  JKK:[id number] DOB: 02/11/1930 DOA: 10/13/2020 PCP: Gaynelle Arabian, MD    Brief Narrative: [name] is a 87 y.o. male with medical history significant of atrial fibrillation on chronic anticoagulation, embolic CVA, and venous stasis ulcers who presents after having an unwitnessed fall yesterday morning at around 4 AM.  Patient is unable to give history at this time as he is altered and obtained from his son.  Patient was found on the floor and thought that he likely fell backwards.  His son was able to help pick him up, but the patient was complaining of left shoulder and right leg pain.  His son notes that at some point they found some blood present in his stool when he had a bowel movement, but when they wiped him they did not notice any further blood.  Upon further questioning it appears the patient is 4 days and has been more weak.  At baseline patient poor recent memory, but was more confused than normal and not oriented to place or time.  Reported that it seemed like he was hallucinating complaining of the floor moving and saying things like "want to play this game" that did not make sense.  Associated symptoms include swelling of his leg and he has been followed by wound care for venous stasis ulcers.  While in the emergency department patient fell asleep twice and had shaking of his arms that lasted briefly.  No reports of tongue biting, incontinence of urine, or fevers.  He is not on oxygen at baseline and he quit smoking several years ago.   ED Course: Upon admission into the emergency department patient was noted to be afebrile, pulse 81-1 06, respirations 16-21, blood pressures maintained, and O2 saturations as low as 87% on room air with improvement on 2 L of oxygen.  CT scan of the head and cervical spine did not note any acute fracture or intercranial abnormality.  Labs since 10/4 significant for platelet count noted, sodium 127, CO2 29, chloride 88, AST  43, total bilirubin 1.5, BNP 141.1.  Chest x-ray noted stable cardiac silhouette with concern for interstitial edema pattern.  CT scan of the pelvis was significant for soft tissues contusion or hematoma overlying the posterior lateral right hip.  Noted urinalysis was positive for small hemoglobin and greater than 50 RBCs.  Assessment & Plan:   Principal Problem:   Acute respiratory failure with hypoxia (HCC) Active Problems:   Atrial fibrillation (HCC)   Long term current use of anticoagulant therapy   Essential hypertension   Fall at home, initial encounter   Hyponatremia   Hyperbilirubinemia   Acute diastolic CHF (congestive heart failure) ([place])    #1 acute hypoxic respiratory failure secondary to acute diastolic heart failure exacerbation. Echo ejection fraction 60 to 65%.   Patient was hypoxic at 87% on room air on admission with 2+ bilateral pitting edema and chest x-ray consistent with pulmonary edema and elevated BNP. Lasix restarted 10/17/2020 with improvement in his symptoms Chest x-ray with improved pulmonary infiltrates and decreased right basilar atelectasis. Encourage incentive spirometer  #2 acute metabolic encephalopathy likely from hypoxia secondary to #1.  Patient lives alone at home however his son lives at the back of the house and keeps an eye on him. TSH normal Ammonia not elevated EEG no seizures or epileptiform discharges normal study.  #3chronic afib on coumadin INR 3.0  #4 fall at home x-rays of the hip shows no acute fracture  PT recommending SNF.  #5 hypervolemic hyponatremia -likely multifactorial, improving since restarting Lasix.na 125. Seizure precautions  He was on HCTZ at home which has been stopped  Normal TSH  Urine osm normal Serum osm low normal  Avoid hypotonic fluids Repeat labs in a.m.  #6 history of essential hypertension continue current meds  #7 chronic venous stasis dermatitis with 2 open wound on the right lower extremity seen  by wound care nurse.  Secure with foam dressing Wrap the right leg with Kerlix and Coban change 3 times a week.    Estimated body mass index is 28.63 kg/m as calculated from the following:   Height as of 08/31/20: 5\' 10"  (1.778 m).   Weight as of this encounter: 90.5 kg.  DVT prophylaxis: coumadin Code Status:full Family Communication: dw son Disposition Plan:  Status is: Inpatient  Remains inpatient appropriate because:IV treatments appropriate due to intensity of illness or inability to take PO  Dispo: The patient is from: Home              Anticipated d/c is to: Home              Patient currently is not medically stable to d/c.   Difficult to place patient No   Consultants:  cardio  Procedures: none Antimicrobials: None  Subjective:  He is resting in bed he appears a little bit more weaker than yesterday.  His son is by the bedside.  Discussed with him detail about his father's condition.  They are open to going to rehab if needed as he has to take care of patients wife who is on dialysis 3 times a week and the patient. objective: Vitals:   10/18/20 2333 10/19/20 0508 10/19/20 0816 10/19/20 1122  BP: 134/75 119/72 (!) 140/91 122/74  Pulse: 90 92 100 97  Resp: 18 17 17 19   Temp: 98.5 F (36.9 C) 97.7 F (36.5 C) 97.6 F (36.4 C) 97.7 F (36.5 C)  TempSrc: Oral Oral Oral Oral  SpO2: 95% 94% 93% 96%  Weight:  90.5 kg      Intake/Output Summary (Last 24 hours) at 10/19/2020 1148 Last data filed at 10/19/2020 0600 Gross per 24 hour  Intake 0 ml  Output 2450 ml  Net -2450 ml    Filed Weights   10/17/20 0359 10/18/20 0328 10/19/20 0508  Weight: 98.4 kg 94.1 kg 90.5 kg    Examination:  General exam: Appears calm and comfortable  Respiratory system: Crackles at the bases to auscultation. Respiratory effort normal. Cardiovascular system: S1 & S2 heard, RRR. No JVD, murmurs, rubs, gallops or clicks. No pedal edema. Gastrointestinal system: Abdomen is  nondistended, soft and nontender. No organomegaly or masses felt. Normal bowel sounds heard. Central nervous system: Alert and oriented. No focal neurological deficits. Extremities: 2+ bilateral pitting edema  skin: Right lower extremity to open wounds Psychiatry: Judgement and insight appear normal. Mood & affect appropriate.     Data Reviewed: I have personally reviewed following labs and imaging studies  CBC: Recent Labs  Lab 10/13/20 1607 10/14/20 1437 10/15/20 0129 10/18/20 0106 10/19/20 0119  WBC 8.9  --  8.7 8.7 7.6  NEUTROABS 7.0  --   --   --   --   HGB 13.6 15.6 13.6 14.3 14.1  HCT 40.6 46.0 40.9 42.9 43.2  MCV 87.7  --  86.5 86.3 87.4  PLT PLATELET CLUMPS NOTED ON SMEAR, UNABLE TO ESTIMATE  --  PLATELET CLUMPS NOTED ON SMEAR, UNABLE TO ESTIMATE PLATELET  CLUMPS NOTED ON SMEAR, UNABLE TO ESTIMATE PLATELET CLUMPS NOTED ON SMEAR, UNABLE TO ESTIMATE    Basic Metabolic Panel: Recent Labs  Lab 10/15/20 0129 10/16/20 0158 10/17/20 0140 10/18/20 0106 10/19/20 0119  NA 125* 122* 121* 124* 125*  K 3.7 3.5 3.7 3.9 3.6  CL 88* 85* 84* 84* 84*  CO2 29 30 31  33* 33*  GLUCOSE 111* 115* 110* 116* 99  BUN 11 15 14 17 20   CREATININE 0.86 1.01 0.83 0.95 0.97  CALCIUM 8.7* 8.6* 8.4* 8.7* 8.7*  MG  --  1.7 1.9 2.0 2.0    GFR: Estimated Creatinine Clearance: 59.6 mL/min (by C-G formula based on SCr of 0.97 mg/dL). Liver Function Tests: Recent Labs  Lab 10/14/20 0716 10/15/20 0129  AST 43* 41  ALT 27 24  ALKPHOS 54 56  BILITOT 1.5* 1.8*  PROT <3.0* 7.4  ALBUMIN 3.5 3.2*    No results for input(s): LIPASE, AMYLASE in the last 168 hours. Recent Labs  Lab 10/14/20 1426  AMMONIA 23    Coagulation Profile: Recent Labs  Lab 10/15/20 0129 10/16/20 0437 10/17/20 0140 10/18/20 0106 10/19/20 0119  INR 2.4* 2.7* 2.5* 2.9* 3.0*    Cardiac Enzymes: Recent Labs  Lab 10/14/20 1426  CKTOTAL 413*    BNP (last 3 results) No results for input(s): PROBNP in the  last 8760 hours. HbA1C: No results for input(s): HGBA1C in the last 72 hours. CBG: No results for input(s): GLUCAP in the last 168 hours. Lipid Profile: No results for input(s): CHOL, HDL, LDLCALC, TRIG, CHOLHDL, LDLDIRECT in the last 72 hours.  Thyroid Function Tests: No results for input(s): TSH, T4TOTAL, FREET4, T3FREE, THYROIDAB in the last 72 hours.  Anemia Panel: No results for input(s): VITAMINB12, FOLATE, FERRITIN, TIBC, IRON, RETICCTPCT in the last 72 hours. Sepsis Labs: Recent Labs  Lab 10/14/20 1942  PROCALCITON <0.10     Recent Results (from the past 240 hour(s))  Resp Panel by RT-PCR (Flu A&B, Covid) Nasopharyngeal Swab     Status: None   Collection Time: 10/14/20 12:42 PM   Specimen: Nasopharyngeal Swab; Nasopharyngeal(NP) swabs in vial transport medium  Result Value Ref Range Status   SARS Coronavirus 2 by RT PCR NEGATIVE NEGATIVE Final    Comment: (NOTE) SARS-CoV-2 target nucleic acids are NOT DETECTED.  The SARS-CoV-2 RNA is generally detectable in upper respiratory specimens during the acute phase of infection. The lowest concentration of SARS-CoV-2 viral copies this assay can detect is 138 copies/mL. A negative result does not preclude SARS-Cov-2 infection and should not be used as the sole basis for treatment or other patient management decisions. A negative result may occur with  improper specimen collection/handling, submission of specimen other than nasopharyngeal swab, presence of viral mutation(s) within the areas targeted by this assay, and inadequate number of viral copies(<138 copies/mL). A negative result must be combined with clinical observations, patient history, and epidemiological information. The expected result is Negative.  Fact Sheet for Patients:  EntrepreneurPulse.com.au  Fact Sheet for Healthcare Providers:  IncredibleEmployment.be  This test is no t yet approved or cleared by the [place] FDA and  has been authorized for detection and/or diagnosis of SARS-CoV-2 by FDA under an Emergency Use Authorization (EUA). This EUA will remain  in effect (meaning this test can be used) for the duration of the COVID-19 declaration under Section 564(b)(1) of the Act, 21 U.S.C.section 360bbb-3(b)(1), unless the authorization is terminated  or revoked sooner.       Influenza A by  PCR NEGATIVE NEGATIVE Final   Influenza B by PCR NEGATIVE NEGATIVE Final    Comment: (NOTE) The Xpert Xpress SARS-CoV-2/FLU/RSV plus assay is intended as an aid in the diagnosis of influenza from Nasopharyngeal swab specimens and should not be used as a sole basis for treatment. Nasal washings and aspirates are unacceptable for Xpert Xpress SARS-CoV-2/FLU/RSV testing.  Fact Sheet for Patients: EntrepreneurPulse.com.au  Fact Sheet for Healthcare Providers: IncredibleEmployment.be  This test is not yet approved or cleared by the [place] FDA and has been authorized for detection and/or diagnosis of SARS-CoV-2 by FDA under an Emergency Use Authorization (EUA). This EUA will remain in effect (meaning this test can be used) for the duration of the COVID-19 declaration under Section 564(b)(1) of the Act, 21 U.S.C. section 360bbb-3(b)(1), unless the authorization is terminated or revoked.  Performed at [facility], [place] [address] [place], [place] [address]   Culture, blood (routine x 2)     Status: None   Collection Time: 10/14/20  9:25 PM   Specimen: BLOOD  Result Value Ref Range Status   Specimen Description BLOOD RIGHT ANTECUBITAL  Final   Special Requests   Final    BOTTLES DRAWN AEROBIC AND ANAEROBIC Blood Culture adequate volume   Culture   Final    NO GROWTH 5 DAYS Performed at [facility], [place] [address], [place], [place] [address]    Report Status 10/19/2020 FINAL  Final  Culture, blood (routine x 2)     Status: None   Collection  Time: 10/14/20  9:25 PM   Specimen: BLOOD RIGHT HAND  Result Value Ref Range Status   Specimen Description BLOOD RIGHT HAND  Final   Special Requests   Final    BOTTLES DRAWN AEROBIC AND ANAEROBIC Blood Culture adequate volume   Culture   Final    NO GROWTH 5 DAYS Performed at [facility], [place] [address] [place], [place] [address]    Report Status 10/19/2020 FINAL  Final          Radiology Studies: DG CHEST PORT 1 VIEW  Result Date: 10/18/2020 CLINICAL DATA:  Shortness of breath EXAM: PORTABLE CHEST 1 VIEW COMPARISON:  Portable exam 0742 hours compared to 10/17/2020 FINDINGS: Enlargement of cardiac silhouette. Atherosclerotic calcification aorta. Diffuse interstitial infiltrates, improved. Residual RIGHT basilar atelectasis. No pneumothorax or acute osseous findings. Old healed RIGHT midclavicular fracture with deformity. IMPRESSION: Improved pulmonary infiltrates and decreased RIGHT basilar atelectasis. Enlargement of cardiac silhouette. Aortic Atherosclerosis (ICD10-I70.0). Electronically Signed   By: Lavonia Dana M.D.   On: 10/18/2020 09:43        Scheduled Meds:  acetaZOLAMIDE  250 mg Oral BID   furosemide  40 mg Intravenous Q12H   metoprolol succinate  100 mg Oral Daily   sodium chloride flush  3 mL Intravenous Q12H   warfarin  4 mg Oral ONCE-1600   Warfarin - Pharmacist Dosing Inpatient   Does not apply q1600   Continuous Infusions:   LOS: 5 days    Time spent: 39 min   Georgette Shell, MD  10/19/2020, 11:48 AM

## 2020-10-20 LAB — CBC WITH DIFFERENTIAL/PLATELET
Abs Immature Granulocytes: 0.09 10*3/uL — ABNORMAL HIGH (ref 0.00–0.07)
Basophils Absolute: 0 10*3/uL (ref 0.0–0.1)
Basophils Relative: 1 %
Eosinophils Absolute: 0.1 10*3/uL (ref 0.0–0.5)
Eosinophils Relative: 1 %
HCT: 45.4 % (ref 39.0–52.0)
Hemoglobin: 15.1 g/dL (ref 13.0–17.0)
Immature Granulocytes: 1 %
Lymphocytes Relative: 8 %
Lymphs Abs: 0.7 10*3/uL (ref 0.7–4.0)
MCH: 28.7 pg (ref 26.0–34.0)
MCHC: 33.3 g/dL (ref 30.0–36.0)
MCV: 86.3 fL (ref 80.0–100.0)
Monocytes Absolute: 1.1 10*3/uL — ABNORMAL HIGH (ref 0.1–1.0)
Monocytes Relative: 13 %
Neutro Abs: 6.4 10*3/uL (ref 1.7–7.7)
Neutrophils Relative %: 76 %
Platelets: UNDETERMINED 10*3/uL (ref 150–400)
RBC: 5.26 MIL/uL (ref 4.22–5.81)
RDW: 13 % (ref 11.5–15.5)
WBC: 8.4 10*3/uL (ref 4.0–10.5)
nRBC: 0 % (ref 0.0–0.2)

## 2020-10-20 LAB — COMPREHENSIVE METABOLIC PANEL
ALT: 22 U/L (ref 0–44)
AST: 27 U/L (ref 15–41)
Albumin: 3.1 g/dL — ABNORMAL LOW (ref 3.5–5.0)
Alkaline Phosphatase: 62 U/L (ref 38–126)
Anion gap: 9 (ref 5–15)
BUN: 22 mg/dL (ref 8–23)
CO2: 30 mmol/L (ref 22–32)
Calcium: 9.1 mg/dL (ref 8.9–10.3)
Chloride: 85 mmol/L — ABNORMAL LOW (ref 98–111)
Creatinine, Ser: 1.08 mg/dL (ref 0.61–1.24)
GFR, Estimated: >60 mL/min (ref >60)
Glucose, Bld: 109 mg/dL — ABNORMAL HIGH (ref 70–99)
Potassium: 3.6 mmol/L (ref 3.5–5.1)
Sodium: 124 mmol/L — ABNORMAL LOW (ref 135–145)
Total Bilirubin: 1.5 mg/dL — ABNORMAL HIGH (ref 0.3–1.2)
Total Protein: 8.2 g/dL — ABNORMAL HIGH (ref 6.5–8.1)

## 2020-10-20 LAB — PROTIME-INR
INR: 2.8 — ABNORMAL HIGH (ref 0.8–1.2)
Prothrombin Time: 29.6 seconds — ABNORMAL HIGH (ref 11.4–15.2)

## 2020-10-20 LAB — BRAIN NATRIURETIC PEPTIDE: B Natriuretic Peptide: 103.7 pg/mL — ABNORMAL HIGH (ref 0.0–100.0)

## 2020-10-20 MED ORDER — WARFARIN SODIUM 4 MG PO TABS
4.0000 mg | ORAL_TABLET | Freq: Once | ORAL | Status: AC
  Administered 2020-10-20: 4 mg via ORAL
  Filled 2020-10-20: qty 1

## 2020-10-20 NOTE — TOC Progression Note (Signed)
Transition of Care [facility]) - Progression Note    Patient Details  Name: [name] MRN: [id number] Date of Birth: 1931-01-08  Transition of Care [facility]) CM/SW [place], [place] Phone Number: 10/20/2020, 3:09 PM  Clinical Narrative:     CSW spoke with patient's spouse, she confirmed the plan to mo ve forward with SNF- she confirmed as advised by her son, [facility] first choice and [facility] second. CSW explained once bed is confirmed, once close to being medically stable, CSW will seek insurance approval. She states understanding. No questions or concerns noted at this time.   CSW contacted [employer]- left  voice message to contact CSW.  CSW will continue to follow and assist with discharge planning.  Thurmond Butts, MSW, LCSW Clinical Social Worker     Barriers to Discharge: Continued Medical Work up, SNF Pending bed offer  Expected Discharge Plan and Services   In-house Referral: Clinical Social Work     Living arrangements for the past 2 months: Single Family Home                                       Social Determinants of Health (SDOH) Interventions    Readmission Risk Interventions No flowsheet data found.

## 2020-10-20 NOTE — Progress Notes (Signed)
Physical Therapy Treatment Patient Details Name: [name] MRN: [id number] DOB: [birth year] Today's Date: 10/20/2020   History of Present Illness Pt is an 87 y.o. male admitted 10/13/20 after fall, found to have tailbone pain, hematura, confusion. Workup for AMS, acute respiratory failure secondary to CHF exacerbation. Pelvic CT showed soft tissue contusion or hematoma overlying posterior lateral R hip. PMH includes afib, CVA, HTN, venous stasis ulcers.   PT Comments    Pt progressing with mobility. Today's session focused on transfer and gait training, pt requiring up to modA+2 for bed mobility and standing, able to progression ambulation in room with RW and minA+2. Pt pleasantly confused, requiring increased time to follow simple commands with frequent verbal cuing. Pt remains limited by generalized weakness, decreased activity tolerance, poor balance strategies/postural reactions, and impaired cognition. Continue to recommend SNF-level therapies to maximize functional mobility and independence prior to return home.    Recommendations for follow up therapy are one component of a multi-disciplinary discharge planning process, led by the attending physician.  Recommendations may be updated based on patient status, additional functional criteria and insurance authorization.  Follow Up Recommendations  SNF;Supervision/Assistance - 24 hour     Equipment Recommendations  None recommended by PT    Recommendations for Other Services       Precautions / Restrictions Precautions Precautions: Fall;Other (comment) Precaution Comments: bladder/bowel incontinence Restrictions Weight Bearing Restrictions: No     Mobility  Bed Mobility Overal bed mobility: Needs Assistance Bed Mobility: Supine to Sit     Supine to sit: Mod assist;+2 for physical assistance;HOB elevated     General bed mobility comments: Good BLE initiation to EOB, requiring modA+2 for UE support to elevate trunk and  scoot hips to EOB; significant increased time to establish base of support and maintain balance once sitting EOB    Transfers Overall transfer level: Needs assistance Equipment used: Rolling walker (2 wheeled) Transfers: Sit to/from Stand Sit to Stand: Mod assist;Min assist;+2 physical assistance         General transfer comment: ModA+2 to stand from EOB to RW, verbal cues for hand placement and sequencing; additional stand from raised BSC to RW with minA+2, pt assisting well with RUE pulling on sink to stand; poor eccentric control to sitting  Ambulation/Gait Ambulation/Gait assistance: Min assist;+2 physical assistance;+2 safety/equipment Gait Distance (Feet): 12 Feet Assistive device: Rolling walker (2 wheeled) Gait Pattern/deviations: Step-to pattern;Shuffle;Step-through pattern;Trunk flexed;Leaning posteriorly Gait velocity: Decreased   General Gait Details: Slow, shuffling (Parkinsonian-like?) gait with RW and minA+2; pt able to increase step length and foot clearance with verbal cues for "big step"; pt taking 1x standing rest break at sink at 1x sitting rest break to void on BSC, quick to fatigue   Stairs             Wheelchair Mobility    Modified Rankin (Stroke Patients Only)       Balance Overall balance assessment: Needs assistance;History of Falls Sitting-balance support: Bilateral upper extremity supported;Feet supported Sitting balance-Leahy Scale: Poor Sitting balance - Comments: Increased time to establish BOS and maintain balance sitting EOB, ultimately progressing to min guard but still reliant on UE support Postural control: Posterior lean Standing balance support: Bilateral upper extremity supported;During functional activity Standing balance-Leahy Scale: Poor Standing balance comment: Reliant on UE support and external assist; dependent for posterior pericare                            Cognition  Arousal/Alertness:  Awake/alert Behavior During Therapy: WFL for tasks assessed/performed;Flat affect Overall Cognitive Status: Impaired/Different from baseline Area of Impairment: Attention;Memory;Following commands;Safety/judgement;Awareness;Problem solving                   Current Attention Level: Sustained;Selective Memory: Decreased short-term memory Following Commands: Follows one step commands with increased time Safety/Judgement: Decreased awareness of safety;Decreased awareness of deficits Awareness: Intellectual Problem Solving: Slow processing;Decreased initiation;Difficulty sequencing;Requires verbal cues General Comments: Needing frequent verbal cues and increased time following instructions; unaware of bladder/bowel incontinence; pleasant and agreeable      Exercises      General Comments General comments (skin integrity, edema, etc.): HR up to 140s with activity, typically maintaining 120s; SpO2 97% on RA. Son present at beginning of session      Pertinent Vitals/Pain Pain Assessment: Faces Faces Pain Scale: Hurts a little bit Pain Location: chest/abdomen Pain Descriptors / Indicators: Tightness Pain Intervention(s): Monitored during session    Home Living                      Prior Function            PT Goals (current goals can now be found in the care plan section) Progress towards PT goals: Progressing toward goals    Frequency    Min 2X/week      PT Plan Current plan remains appropriate    Co-evaluation              AM-PAC PT "6 Clicks" Mobility   Outcome Measure  Help needed turning from your back to your side while in a flat bed without using bedrails?: A Lot Help needed moving from lying on your back to sitting on the side of a flat bed without using bedrails?: A Lot Help needed moving to and from a bed to a chair (including a wheelchair)?: A Lot Help needed standing up from a chair using your arms (e.g., wheelchair or bedside chair)?:  A Lot Help needed to walk in hospital room?: A Lot Help needed climbing 3-5 steps with a railing? : Total 6 Click Score: 11    End of Session Equipment Utilized During Treatment: Gait belt Activity Tolerance: Patient tolerated treatment well Patient left: in chair;with call bell/phone within reach;with chair alarm set Nurse Communication: Mobility status PT Visit Diagnosis: Other abnormalities of gait and mobility (R26.89);Repeated falls (R29.6);Muscle weakness (generalized) (M62.81)     Time: 7782-4235 PT Time Calculation (min) (ACUTE ONLY): 25 min  Charges:  $Therapeutic Activity: 8-22 mins                     Mabeline Caras, PT, DPT Acute Rehabilitation Services  Pager [phone] Office [phone]  Derry Lory 10/20/2020, 9:38 AM

## 2020-10-20 NOTE — Progress Notes (Signed)
[place] for warfarin Indication: atrial fibrillation  No Known Allergies  Patient Measurements: Weight: 91.6 kg (201 lb 15.1 oz) Heparin Dosing Weight: n/a  Vital Signs: Temp: 98 F (36.7 C) (10/11 0743) Temp Source: Oral (10/11 0743) BP: 114/71 (10/11 0743) Pulse Rate: 88 (10/11 0743)  Labs: Recent Labs    10/18/20 0106 10/19/20 0119 10/20/20 0314 10/20/20 0728  HGB 14.3 14.1  --  15.1  HCT 42.9 43.2  --  45.4  PLT PLATELET CLUMPS NOTED ON SMEAR, UNABLE TO ESTIMATE PLATELET CLUMPS NOTED ON SMEAR, UNABLE TO ESTIMATE  --  PLATELET CLUMPS NOTED ON SMEAR, UNABLE TO ESTIMATE  LABPROT 30.0* 31.0* 29.6*  --   INR 2.9* 3.0* 2.8*  --   CREATININE 0.95 0.97  --  1.08     Estimated Creatinine Clearance: 53.8 mL/min (by C-G formula based on SCr of 1.08 mg/dL).    Assessment: 50 yoM on warfarin PTA for afib presenting s/p fall. Imaging negative for bleed. Pharmacy consulted to dose warfarin inpatient.   INR 2.8 after dose decreased yesterday    PTA warfarin dose: 5mg  daily except 7.5mg  on Mon (last dose 10/4 PTA)  Goal of Therapy:  INR 2-3 Monitor platelets by anticoagulation protocol: Yes   Plan:  Warfarin 4mg  PO x1  repeat again tonight Daily INR   Bonnita Nasuti Pharm.D. CPP, BCPS Clinical Pharmacist [phone] 10/20/2020 2:18 PM

## 2020-10-20 NOTE — Progress Notes (Signed)
PROGRESS NOTE    [name]  GYI:[id number] DOB: [birth year] DOA: 10/13/2020 PCP: Gaynelle Arabian, MD    Brief Narrative:-year-old male with history of A. fib on chronic anticoagulation with Coumadin history of stroke venous stasis ulcers lives at home admitted with change in mental status hypoxia and fluid retention.  Assessment & Plan:   Principal Problem:   Acute respiratory failure with hypoxia (HCC) Active Problems:   Atrial fibrillation (HCC)   Long term current use of anticoagulant therapy   Essential hypertension   Fall at home, initial encounter   Hyponatremia   Hyperbilirubinemia   Acute diastolic CHF (congestive heart failure) ([place])    #1 acute hypoxic respiratory failure secondary to acute diastolic heart failure exacerbation. Echo ejection fraction 60 to 65%.   Patient was hypoxic at 87% on room air on admission with 2+ bilateral pitting edema and chest x-ray consistent with pulmonary edema and elevated BNP. Lasix restarted 10/17/2020 with improvement in his symptoms Chest x-ray with improved pulmonary infiltrates and decreased right basilar atelectasis. NEGATIVE BY 2 L  #2 acute metabolic encephalopathy-RESOLVED  likely from hypoxia secondary to #1.  Patient lives alone at home however his son lives at the back of the house and keeps an eye on him. TSH normal Ammonia not elevated EEG no seizures or epileptiform discharges normal study.  #3chronic afib on coumadin INR 3.0  #4 fall at home x-rays of the hip shows no acute fracture PT recommending SNF.  #5 hypervolemic hyponatremia -likely multifactorial, improving since restarting Lasix.na 124.  Nephrology following. Seizure precautions  He was on HCTZ at home which has been stopped  Normal TSH  Urine osm normal Serum osm low normal  Avoid hypotonic fluids Repeat labs in a.m.  #6 history of essential hypertension continue current meds  #7 chronic venous stasis dermatitis with 2 open wound on the right  lower extremity seen by wound care nurse.  Secure with foam dressing Wrap the right leg with Kerlix and Coban change 3 times a week.    Estimated body mass index is 28.98 kg/m as calculated from the following:   Height as of 08/31/20: 5\' 10"  (1.778 m).   Weight as of this encounter: 91.6 kg.  DVT prophylaxis: coumadin Code Status:full Family Communication: dw son Disposition Plan:  Status is: Inpatient  Remains inpatient appropriate because:IV treatments appropriate due to intensity of illness or inability to take PO  Dispo: The patient is from: Home              Anticipated d/c is to: Home              Patient currently is not medically stable to d/c.   Difficult to place patient No   Consultants:  cardio  Procedures: none Antimicrobials: None  Subjective:  Patient is resting in bed eating breakfast He is awake and alert answers all questions appropriately and follows commands His son is by the bedside objective: Vitals:   10/19/20 2344 10/20/20 0359 10/20/20 0400 10/20/20 0743  BP: (!) 155/87 140/83 140/83 114/71  Pulse: 89 94 84 88  Resp: 18 14 17 18   Temp: 98.5 F (36.9 C) 98.1 F (36.7 C)  98 F (36.7 C)  TempSrc: Oral Axillary  Oral  SpO2: 96% 100% 100% 93%  Weight:  91.6 kg      Intake/Output Summary (Last 24 hours) at 10/20/2020 1518 Last data filed at 10/20/2020 0600 Gross per 24 hour  Intake 730 ml  Output 2850 ml  Net -  2120 ml    Filed Weights   10/18/20 0328 10/19/20 0508 10/20/20 0359  Weight: 94.1 kg 90.5 kg 91.6 kg    Examination:  General exam: Appears calm and comfortable  Respiratory system: Crackles at the bases to auscultation. Respiratory effort normal. Cardiovascular system: S1 & S2 heard, RRR. No JVD, murmurs, rubs, gallops or clicks. No pedal edema. Gastrointestinal system: Abdomen is nondistended, soft and nontender. No organomegaly or masses felt. Normal bowel sounds heard. Central nervous system: Alert and oriented. No focal  neurological deficits. Extremities: 2+ bilateral pitting edema  skin: Right lower extremity to open wounds Psychiatry: Judgement and insight appear normal. Mood & affect appropriate.     Data Reviewed: I have personally reviewed following labs and imaging studies  CBC: Recent Labs  Lab 10/13/20 1607 10/14/20 1437 10/15/20 0129 10/18/20 0106 10/19/20 0119 10/20/20 0728  WBC 8.9  --  8.7 8.7 7.6 8.4  NEUTROABS 7.0  --   --   --   --  6.4  HGB 13.6 15.6 13.6 14.3 14.1 15.1  HCT 40.6 46.0 40.9 42.9 43.2 45.4  MCV 87.7  --  86.5 86.3 87.4 86.3  PLT PLATELET CLUMPS NOTED ON SMEAR, UNABLE TO ESTIMATE  --  PLATELET CLUMPS NOTED ON SMEAR, UNABLE TO ESTIMATE PLATELET CLUMPS NOTED ON SMEAR, UNABLE TO ESTIMATE PLATELET CLUMPS NOTED ON SMEAR, UNABLE TO ESTIMATE PLATELET CLUMPS NOTED ON SMEAR, UNABLE TO ESTIMATE    Basic Metabolic Panel: Recent Labs  Lab 10/16/20 0158 10/17/20 0140 10/18/20 0106 10/19/20 0119 10/20/20 0728  NA 122* 121* 124* 125* 124*  K 3.5 3.7 3.9 3.6 3.6  CL 85* 84* 84* 84* 85*  CO2 30 31 33* 33* 30  GLUCOSE 115* 110* 116* 99 109*  BUN 15 14 17 20 22   CREATININE 1.01 0.83 0.95 0.97 1.08  CALCIUM 8.6* 8.4* 8.7* 8.7* 9.1  MG 1.7 1.9 2.0 2.0  --     GFR: Estimated Creatinine Clearance: 53.8 mL/min (by C-G formula based on SCr of 1.08 mg/dL). Liver Function Tests: Recent Labs  Lab 10/14/20 0716 10/15/20 0129 10/20/20 0728  AST 43* 41 27  ALT 27 24 22   ALKPHOS 54 56 62  BILITOT 1.5* 1.8* 1.5*  PROT <3.0* 7.4 8.2*  ALBUMIN 3.5 3.2* 3.1*    No results for input(s): LIPASE, AMYLASE in the last 168 hours. Recent Labs  Lab 10/14/20 1426  AMMONIA 23    Coagulation Profile: Recent Labs  Lab 10/16/20 0437 10/17/20 0140 10/18/20 0106 10/19/20 0119 10/20/20 0314  INR 2.7* 2.5* 2.9* 3.0* 2.8*    Cardiac Enzymes: Recent Labs  Lab 10/14/20 1426  CKTOTAL 413*    BNP (last 3 results) No results for input(s): PROBNP in the last 8760  hours. HbA1C: No results for input(s): HGBA1C in the last 72 hours. CBG: No results for input(s): GLUCAP in the last 168 hours. Lipid Profile: No results for input(s): CHOL, HDL, LDLCALC, TRIG, CHOLHDL, LDLDIRECT in the last 72 hours.  Thyroid Function Tests: No results for input(s): TSH, T4TOTAL, FREET4, T3FREE, THYROIDAB in the last 72 hours.  Anemia Panel: No results for input(s): VITAMINB12, FOLATE, FERRITIN, TIBC, IRON, RETICCTPCT in the last 72 hours. Sepsis Labs: Recent Labs  Lab 10/14/20 1942  PROCALCITON <0.10     Recent Results (from the past 240 hour(s))  Resp Panel by RT-PCR (Flu A&B, Covid) Nasopharyngeal Swab     Status: None   Collection Time: 10/14/20 12:42 PM   Specimen: Nasopharyngeal Swab; Nasopharyngeal(NP) swabs in vial  transport medium  Result Value Ref Range Status   SARS Coronavirus 2 by RT PCR NEGATIVE NEGATIVE Final    Comment: (NOTE) SARS-CoV-2 target nucleic acids are NOT DETECTED.  The SARS-CoV-2 RNA is generally detectable in upper respiratory specimens during the acute phase of infection. The lowest concentration of SARS-CoV-2 viral copies this assay can detect is 138 copies/mL. A negative result does not preclude SARS-Cov-2 infection and should not be used as the sole basis for treatment or other patient management decisions. A negative result may occur with  improper specimen collection/handling, submission of specimen other than nasopharyngeal swab, presence of viral mutation(s) within the areas targeted by this assay, and inadequate number of viral copies(<138 copies/mL). A negative result must be combined with clinical observations, patient history, and epidemiological information. The expected result is Negative.  Fact Sheet for Patients:  EntrepreneurPulse.com.au  Fact Sheet for Healthcare Providers:  IncredibleEmployment.be  This test is no t yet approved or cleared by the [place] FDA and   has been authorized for detection and/or diagnosis of SARS-CoV-2 by FDA under an Emergency Use Authorization (EUA). This EUA will remain  in effect (meaning this test can be used) for the duration of the COVID-19 declaration under Section 564(b)(1) of the Act, 21 U.S.C.section 360bbb-3(b)(1), unless the authorization is terminated  or revoked sooner.       Influenza A by PCR NEGATIVE NEGATIVE Final   Influenza B by PCR NEGATIVE NEGATIVE Final    Comment: (NOTE) The Xpert Xpress SARS-CoV-2/FLU/RSV plus assay is intended as an aid in the diagnosis of influenza from Nasopharyngeal swab specimens and should not be used as a sole basis for treatment. Nasal washings and aspirates are unacceptable for Xpert Xpress SARS-CoV-2/FLU/RSV testing.  Fact Sheet for Patients: EntrepreneurPulse.com.au  Fact Sheet for Healthcare Providers: IncredibleEmployment.be  This test is not yet approved or cleared by the [place] FDA and has been authorized for detection and/or diagnosis of SARS-CoV-2 by FDA under an Emergency Use Authorization (EUA). This EUA will remain in effect (meaning this test can be used) for the duration of the COVID-19 declaration under Section 564(b)(1) of the Act, 21 U.S.C. section 360bbb-3(b)(1), unless the authorization is terminated or revoked.  Performed at [facility], [place] [address], [place], [place] [address]   Culture, blood (routine x 2)     Status: None   Collection Time: 10/14/20  9:25 PM   Specimen: BLOOD  Result Value Ref Range Status   Specimen Description BLOOD RIGHT ANTECUBITAL  Final   Special Requests   Final    BOTTLES DRAWN AEROBIC AND ANAEROBIC Blood Culture adequate volume   Culture   Final    NO GROWTH 5 DAYS Performed at [facility], [place] [address], [place], [place] [address]    Report Status 10/19/2020 FINAL  Final  Culture, blood (routine x 2)     Status: None   Collection Time: 10/14/20   9:25 PM   Specimen: BLOOD RIGHT HAND  Result Value Ref Range Status   Specimen Description BLOOD RIGHT HAND  Final   Special Requests   Final    BOTTLES DRAWN AEROBIC AND ANAEROBIC Blood Culture adequate volume   Culture   Final    NO GROWTH 5 DAYS Performed at [facility], [place] [address] [place], [place] [address]    Report Status 10/19/2020 FINAL  Final          Radiology Studies: No results found.  Scheduled Meds:  furosemide  40 mg Intravenous Q12H   metoprolol succinate  100 mg Oral Daily   sodium chloride flush  3 mL Intravenous Q12H   warfarin  4 mg Oral ONCE-1600   Warfarin - Pharmacist Dosing Inpatient   Does not apply q1600   Continuous Infusions:   LOS: 6 days    Time spent: 39 min   Georgette Shell, MD  10/20/2020, 3:18 PM

## 2020-10-20 NOTE — Progress Notes (Signed)
Heart Failure Navigator Progress Note  Assessed for Heart & Vascular TOC clinic readiness.  Patient does not meet criteria due to continued confusion/poor cognition and decreased physical function.  Navigator available for reassessment of patient.   Pricilla Holm, MSN, RN Heart Failure Nurse Navigator [phone]

## 2020-10-20 NOTE — Progress Notes (Signed)
Occupational Therapy Treatment Patient Details Name: [name] MRN: [id number] DOB: [birth year] Today's Date: 10/20/2020   History of present illness 87 y.o. male admitted 10/13/20 after fall, found to have tailbone pain, hematura, confusion. Workup for AMS, acute respiratory failure secondary to CHF exacerbation. Pelvic CT showed soft tissue contusion or hematoma overlying posterior lateral R hip. PMH includes afib, CVA, HTN, venous stasis ulcers.   OT comments  Pt progressing towards established OT goals. Continues to present with decreased strength, balance, and cognition impacting his functional performance and safety. Pt performing functional mobility to sink with Min-Mod A +2 and RW. Pt performing grooming at sink with Min A for standing balance with cues throughout for sequencing. Pt with incontinence while at sink and requiring to sit at [facility]. Max A +2 for peri care. Continue to recommend dc to SNF and will continue to follow acutely as admitted.   Recommendations for follow up therapy are one component of a multi-disciplinary discharge planning process, led by the attending physician.  Recommendations may be updated based on patient status, additional functional criteria and insurance authorization.    Follow Up Recommendations  SNF;Supervision/Assistance - 24 hour    Equipment Recommendations  Other (comment) (to be determined pending progress)    Recommendations for Other Services      Precautions / Restrictions Precautions Precautions: Fall;Other (comment) Precaution Comments: bladder/bowel incontinence Restrictions Weight Bearing Restrictions: No       Mobility Bed Mobility Overal bed mobility: Needs Assistance Bed Mobility: Supine to Sit     Supine to sit: Mod assist;+2 for physical assistance;HOB elevated     General bed mobility comments: Good BLE initiation to EOB, requiring modA+2 for UE support to elevate trunk and scoot hips to EOB; significant increased  time to establish base of support and maintain balance once sitting EOB    Transfers Overall transfer level: Needs assistance Equipment used: Rolling walker (2 wheeled) Transfers: Sit to/from Stand Sit to Stand: Mod assist;Min assist;+2 physical assistance         General transfer comment: ModA+2 to stand from EOB to RW, verbal cues for hand placement and sequencing; additional stand from raised BSC to RW with minA+2, pt assisting well with RUE pulling on sink to stand; poor eccentric control to sitting    Balance Overall balance assessment: Needs assistance;History of Falls Sitting-balance support: Bilateral upper extremity supported;Feet supported Sitting balance-Leahy Scale: Poor Sitting balance - Comments: Increased time to establish BOS and maintain balance sitting EOB, ultimately progressing to min guard but still reliant on UE support Postural control: Posterior lean Standing balance support: Bilateral upper extremity supported;During functional activity Standing balance-Leahy Scale: Poor Standing balance comment: Reliant on UE support and external assist; dependent for posterior pericare                           ADL either performed or assessed with clinical judgement   ADL Overall ADL's : Needs assistance/impaired     Grooming: Minimal assistance;Wash/dry face;Standing Grooming Details (indicate cue type and reason): Min A for standign blance. Cues for sequencing throughout                 Toilet Transfer: Minimal assistance;Ambulation;BSC;RW;Moderate assistance Toilet Transfer Details (indicate cue type and reason): Min A for power up and Mod A for posterior lean correction when in standing Toileting- Clothing Manipulation and Hygiene: +2 for safety/equipment;Maximal assistance;+2 for physical assistance;Sit to/from stand Toileting - Clothing Manipulation Details (indicate cue type  and reason): Max A for peri care and second person to assist with  maintaining standing     Functional mobility during ADLs: Minimal assistance;Moderate assistance;+2 for physical assistance;+2 for safety/equipment;Rolling walker General ADL Comments: Pt performing functional mobility to sink and then washign hsi face while standing with Min A for balance. Bowel incontience and sitting at [place]?: No apparent visual deficits   Perception     Praxis      Cognition Arousal/Alertness: Awake/alert Behavior During Therapy: WFL for tasks assessed/performed;Flat affect Overall Cognitive Status: Impaired/Different from baseline Area of Impairment: Attention;Memory;Following commands;Safety/judgement;Awareness;Problem solving                   Current Attention Level: Sustained;Selective Memory: Decreased short-term memory Following Commands: Follows one step commands with increased time Safety/Judgement: Decreased awareness of safety;Decreased awareness of deficits Awareness: Intellectual Problem Solving: Slow processing;Decreased initiation;Difficulty sequencing;Requires verbal cues General Comments: Needing frequent verbal cues and increased time following instructions; unaware of bladder/bowel incontinence; pleasant and agreeable        Exercises     Shoulder Instructions       General Comments HR up to 140s with activity, typically maintaining 120s; SpO2 97% on RA. Son present at beginning of session    Pertinent Vitals/ Pain       Pain Assessment: Faces Faces Pain Scale: Hurts a little bit Pain Location: chest/abdomen Pain Descriptors / Indicators: Tightness Pain Intervention(s): Monitored during session;Limited activity within patient's tolerance;Repositioned  Home Living                                          Prior Functioning/Environment              Frequency  Min 2X/week        Progress Toward Goals  OT Goals(current goals can now be found in the care plan  section)  Progress towards OT goals: Progressing toward goals  Acute Rehab OT Goals Patient Stated Goal: return home OT Goal Formulation: With patient/family Time For Goal Achievement: 10/29/20 Potential to Achieve Goals: Fair ADL Goals Pt Will Perform Lower Body Dressing: with mod assist;sit to/from stand Pt Will Transfer to Toilet: with mod assist;stand pivot transfer;bedside commode Pt Will Perform Toileting - Clothing Manipulation and hygiene: with mod assist;sit to/from stand;sitting/lateral leans Additional ADL Goal #1: Pt to complete bed mobility at Min A in prep for ADLs  Plan Discharge plan remains appropriate    Co-evaluation    PT/OT/SLP Co-Evaluation/Treatment: Yes Reason for Co-Treatment: For patient/therapist safety;To address functional/ADL transfers   OT goals addressed during session: ADL's and self-care      AM-PAC OT "6 Clicks" Daily Activity     Outcome Measure   Help from another person eating meals?: A Little Help from another person taking care of personal grooming?: A Little Help from another person toileting, which includes using toliet, bedpan, or urinal?: Total Help from another person bathing (including washing, rinsing, drying)?: A Lot Help from another person to put on and taking off regular upper body clothing?: A Lot Help from another person to put on and taking off regular lower body clothing?: Total 6 Click Score: 12    End of Session Equipment Utilized During Treatment: Gait belt;Rolling walker;Oxygen  OT Visit Diagnosis: Unsteadiness on feet (R26.81);Other abnormalities of gait and mobility (R26.89);Muscle weakness (generalized) (M62.81);History of falling (Z91.81);Pain  Pain - Right/Left: Left Pain - part of body: Shoulder (R LE)   Activity Tolerance Patient tolerated treatment well   Patient Left in chair;with call bell/phone within reach;with chair alarm set;with family/visitor present   Nurse Communication Mobility status         Time: 7026-3785 OT Time Calculation (min): 27 min  Charges: OT General Charges $OT Visit: 1 Visit OT Treatments $Self Care/Home Management : 8-22 mins  [place], OTR/L Acute Rehab Pager: [phone] Office: [place] 10/20/2020, 10:01 AM

## 2020-10-20 NOTE — Progress Notes (Signed)
Mobility Specialist: Progress Note   10/20/20 1632  Mobility  Activity Transferred:  Bed to chair  Level of Assistance Maximum assist, patient does 25-49%  Assistive Device Front wheel walker  Distance Ambulated (ft) 2 ft  Mobility Out of bed to chair with meals  Mobility Response Tolerated fair  Mobility performed by Mobility specialist  Bed Position Chair  $Mobility charge 1 Mobility   Pre-Mobility: 77 HR, 114/61 BP, 94% SpO2 Post-Mobility: 74 HR, 100% SpO2  Pt declined ambulation but agreeable to sit in the chair. Pt required heavy modA to stand from EOB with bed height elevated but was minA to transfer to the recliner. Pt has call bell in his lap with family member present in the room.   [facility] Natter Mobility Specialist Mobility Specialist Phone: [phone]

## 2020-10-20 NOTE — Progress Notes (Signed)
Mobility Specialist: Progress Note   10/20/20 1724  Mobility  Activity Transferred:  Chair to bed  Level of Assistance +2 (takes two people)  Information systems manager Ambulated (ft) 2 ft  Mobility Out of bed to chair with meals  Mobility Response Tolerated well  Mobility performed by Mobility specialist  $Mobility charge 1 Mobility   Pt assisted back to the bed per request. Pt required +2 modA to stand from recliner and contact guard during transfer. Pt back to bed with call bell in his lap and family member present in the room.   [facility] Artha Stavros Mobility Specialist Mobility Specialist Phone: [phone]

## 2020-10-20 NOTE — Progress Notes (Signed)
Patient ID: [name], male   DOB: 1930/12/14, 87 y.o.   MRN: [id number] [facility] Progress Note   Assessment/ Plan:   1.  Hyponatremia: Hypervolemic with hypoosmolality and inappropriately elevated urine osmolality.  Likely associated with acute exacerbation of diastolic heart failure and on furosemide 40 mg IV twice daily overnight-labs pending from this morning.  Previously without indication for hypertonic saline and mental status was questionable to justify safe use of tolvaptan. 2.  Acute exacerbation of diastolic heart failure: Ongoing diuresis for volume unloading, hyponatremia portending poor prognosis for recurrent decompensation/hospitalization. 3.  Hypertension: Continue to hold ACE inhibitor for efforts at correcting sodium level, monitor with ongoing metoprolol and diuresis. 4.  Metabolic alkalosis: Suspected to have underlying chronic respiratory acidosis with exacerbation from ongoing diuresis/contraction.  Status post acetazolamide yesterday.  Subjective:   Reports uneventful night-denies any chest pain or shortness of breath.  Son at bedside inquires about oxygen requirements.   Objective:   BP 114/71 (BP Location: Right Arm)   Pulse 88   Temp 98 F (36.7 C) (Oral)   Resp 18   Wt 91.6 kg   SpO2 93%   BMI 28.98 kg/m   Intake/Output Summary (Last 24 hours) at 10/20/2020 0852 Last data filed at 10/20/2020 0600 Gross per 24 hour  Intake 1690 ml  Output 2850 ml  Net -1160 ml   Weight change: 1.1 kg  Physical Exam: Gen: More awake and alert compared to yesterday, son at bedside CVS: Pulse regular rhythm, normal rate, S1 and S2 normal Resp: Poor inspiratory effort with decreased breath sounds over bases, no distinct rales or rhonchi Abd: Soft, flat, nontender, bowel sounds normal Ext: Right leg in Ace wrap, 1+ left lower extremity edema  Imaging: No results found.  Labs: BMET Recent Labs  Lab 10/13/20 1607 10/14/20 1437 10/15/20 0129  10/16/20 0158 10/17/20 0140 10/18/20 0106 10/19/20 0119 10/20/20 0728  NA 127* 130* 125* 122* 121* 124* 125* 124*  K 3.7 4.0 3.7 3.5 3.7 3.9 3.6 3.6  CL 88*  --  88* 85* 84* 84* 84* 85*  CO2 29  --  29 30 31  33* 33* 30  GLUCOSE 110*  --  111* 115* 110* 116* 99 109*  BUN 11  --  11 15 14 17 20 22   CREATININE 0.93  --  0.86 1.01 0.83 0.95 0.97 1.08  CALCIUM 8.9  --  8.7* 8.6* 8.4* 8.7* 8.7* 9.1   CBC Recent Labs  Lab 10/13/20 1607 10/14/20 1437 10/15/20 0129 10/18/20 0106 10/19/20 0119  WBC 8.9  --  8.7 8.7 7.6  NEUTROABS 7.0  --   --   --   --   HGB 13.6 15.6 13.6 14.3 14.1  HCT 40.6 46.0 40.9 42.9 43.2  MCV 87.7  --  86.5 86.3 87.4  PLT PLATELET CLUMPS NOTED ON SMEAR, UNABLE TO ESTIMATE  --  PLATELET CLUMPS NOTED ON SMEAR, UNABLE TO ESTIMATE PLATELET CLUMPS NOTED ON SMEAR, UNABLE TO ESTIMATE PLATELET CLUMPS NOTED ON SMEAR, UNABLE TO ESTIMATE   Medications:     furosemide  40 mg Intravenous Q12H   metoprolol succinate  100 mg Oral Daily   sodium chloride flush  3 mL Intravenous Q12H   Warfarin - Pharmacist Dosing Inpatient   Does not apply q1600   Elmarie Shiley, MD 10/20/2020, 8:52 AM

## 2020-10-21 LAB — HEPATIC FUNCTION PANEL
ALT: 20 U/L (ref 0–44)
AST: 25 U/L (ref 15–41)
Albumin: 2.8 g/dL — ABNORMAL LOW (ref 3.5–5.0)
Alkaline Phosphatase: 55 U/L (ref 38–126)
Bilirubin, Direct: 0.2 mg/dL (ref 0.0–0.2)
Indirect Bilirubin: 1.1 mg/dL — ABNORMAL HIGH (ref 0.3–0.9)
Total Bilirubin: 1.3 mg/dL — ABNORMAL HIGH (ref 0.3–1.2)
Total Protein: 7.8 g/dL (ref 6.5–8.1)

## 2020-10-21 LAB — RENAL FUNCTION PANEL
Albumin: 2.7 g/dL — ABNORMAL LOW (ref 3.5–5.0)
Anion gap: 11 (ref 5–15)
BUN: 31 mg/dL — ABNORMAL HIGH (ref 8–23)
CO2: 27 mmol/L (ref 22–32)
Calcium: 8.9 mg/dL (ref 8.9–10.3)
Chloride: 86 mmol/L — ABNORMAL LOW (ref 98–111)
Creatinine, Ser: 1.18 mg/dL (ref 0.61–1.24)
GFR, Estimated: 59 mL/min — ABNORMAL LOW (ref >60)
Glucose, Bld: 121 mg/dL — ABNORMAL HIGH (ref 70–99)
Phosphorus: 4.1 mg/dL (ref 2.5–4.6)
Potassium: 3.7 mmol/L (ref 3.5–5.1)
Sodium: 124 mmol/L — ABNORMAL LOW (ref 135–145)

## 2020-10-21 LAB — PROTIME-INR
INR: 2.7 — ABNORMAL HIGH (ref 0.8–1.2)
Prothrombin Time: 28.9 seconds — ABNORMAL HIGH (ref 11.4–15.2)

## 2020-10-21 LAB — SARS CORONAVIRUS 2 (TAT 6-24 HRS): SARS Coronavirus 2: NEGATIVE

## 2020-10-21 LAB — SODIUM: Sodium: 124 mmol/L — ABNORMAL LOW (ref 135–145)

## 2020-10-21 MED ORDER — WARFARIN SODIUM 4 MG PO TABS
4.0000 mg | ORAL_TABLET | Freq: Once | ORAL | Status: AC
  Administered 2020-10-21: 4 mg via ORAL
  Filled 2020-10-21: qty 1

## 2020-10-21 MED ORDER — FUROSEMIDE 40 MG PO TABS
40.0000 mg | ORAL_TABLET | Freq: Every day | ORAL | Status: DC
  Administered 2020-10-22: 40 mg via ORAL
  Filled 2020-10-21: qty 1

## 2020-10-21 MED ORDER — TOLVAPTAN 15 MG PO TABS
15.0000 mg | ORAL_TABLET | Freq: Once | ORAL | Status: AC
  Administered 2020-10-21: 15 mg via ORAL
  Filled 2020-10-21: qty 1

## 2020-10-21 NOTE — Progress Notes (Signed)
Mobility Specialist Progress Note    10/21/20 1300  Mobility  Activity Transferred:  Chair to bed  Level of Assistance +2 (takes two people)  Information systems manager Ambulated (ft) 4 ft  Mobility Out of bed for toileting  Mobility Response Tolerated fair  Mobility performed by Mobility specialist;Nurse tech;Nurse  $Mobility charge 1 Mobility   Pt assisted to [facility] before transferring back to bed due to BM upon standing. Pt assisted w/ pericare. Pt required +2 ModA to stand and transfer. Pt in bed with call bell and wife present in room.   Hildred Alamin Mobility Specialist  Mobility Specialist Phone: [phone]

## 2020-10-21 NOTE — TOC Progression Note (Signed)
Transition of Care [facility]) - Progression Note    Patient Details  Name: [name] MRN: [id number] Date of Birth: [birth year]  Transition of Care [facility]) CM/SW [place], [place] Phone Number: 10/21/2020, 2:14 PM  Clinical Narrative:     CSW informed insurance auth has been approved-  RN updated - covid test requested  Thurmond Butts, MSW, LCSW Clinical Social Worker      Barriers to Discharge: Continued Medical Work up, SNF Pending bed offer  Expected Discharge Plan and Services   In-house Referral: Clinical Social Work     Living arrangements for the past 2 months: Single Family Home                                       Social Determinants of Health (SDOH) Interventions    Readmission Risk Interventions No flowsheet data found.

## 2020-10-21 NOTE — Progress Notes (Signed)
ANTICOAGULATION CONSULT NOTE   Pharmacy Consult for warfarin Indication: atrial fibrillation  No Known Allergies  Patient Measurements: Weight: 92.2 kg (203 lb 4.2 oz) Heparin Dosing Weight: n/a  Vital Signs: Temp: 97.9 F (36.6 C) (10/12 0753) Temp Source: Oral (10/12 0753) BP: 105/65 (10/12 0839) Pulse Rate: 83 (10/12 0839)  Labs: Recent Labs    10/19/20 0119 10/20/20 0314 10/20/20 0728 10/21/20 0208  HGB 14.1  --  15.1  --   HCT 43.2  --  45.4  --   PLT PLATELET CLUMPS NOTED ON SMEAR, UNABLE TO ESTIMATE  --  PLATELET CLUMPS NOTED ON SMEAR, UNABLE TO ESTIMATE  --   LABPROT 31.0* 29.6*  --  28.9*  INR 3.0* 2.8*  --  2.7*  CREATININE 0.97  --  1.08 1.18     Estimated Creatinine Clearance: 49.4 mL/min (by C-G formula based on SCr of 1.18 mg/dL).    Assessment: 51 yoM on warfarin PTA for afib presenting s/p fall. Imaging negative for bleed. Pharmacy consulted to dose warfarin inpatient.   PTA warfarin dose: 5mg  daily except 7.5mg  on Mon (last dose 10/4 PTA)  INR at goal 2.7, after dose decreased to 4mg  x2 days.   Goal of Therapy:  INR 2-3 Monitor platelets by anticoagulation protocol: Yes   Plan:  Warfarin 4mg  PO x1 repeat again tonight Daily INR, s/sx of bleeding  Thank you, Chrisandra Carota, PharmD Candidate 10/21/2020 10:40 AM

## 2020-10-21 NOTE — Progress Notes (Signed)
Patient ID: [name], male   DOB: 14-Sep-1930, 87 y.o.   MRN: [id number] [facility] Progress Note   Assessment/ Plan:   1.  Hyponatremia: Hypervolemic with hypoosmolality and inappropriately elevated urine osmolality.  Likely associated with acute exacerbation of diastolic heart failure and inappropriate ADH activation from decreased effective arterial blood volume.  Will convert to oral furosemide today and add tolvaptan. 2.  Acute exacerbation of diastolic heart failure: Clinically improving status post diuresis with weight down 5 kg since admission and net -10 L fluid balance since admission. 3.  Hypertension: Blood pressures currently on goal with metoprolol and diuresis.  ACE inhibitor on hold. 4.  Metabolic alkalosis: Suspected to have underlying chronic respiratory acidosis with exacerbation from ongoing diuresis/contraction.  Corrected with acetazolamide.  Subjective:   Denies any acute events overnight including chest pain or shortness of breath.  Worked on transfers yesterday with PT.   Objective:   BP 105/65   Pulse 83   Temp 97.9 F (36.6 C) (Oral)   Resp 17   Wt 92.2 kg   SpO2 100%   BMI 29.17 kg/m   Intake/Output Summary (Last 24 hours) at 10/21/2020 0856 Last data filed at 10/21/2020 0753 Gross per 24 hour  Intake 240 ml  Output 650 ml  Net -410 ml   Weight change: 0.6 kg  Physical Exam: Gen: Awake/alert, sitting in recliner watching videos on phone CVS: Pulse regular rhythm, normal rate, S1 and S2 normal Resp: Decreased breath sounds over bases, no distinct rales or rhonchi Abd: Soft, flat, nontender, bowel sounds normal Ext: Right leg in Ace wrap, trace left lower extremity edema with skin wrinkling  Imaging: No results found.  Labs: BMET Recent Labs  Lab 10/15/20 0129 10/16/20 0158 10/17/20 0140 10/18/20 0106 10/19/20 0119 10/20/20 0728 10/21/20 0208  NA 125* 122* 121* 124* 125* 124* 124*  K 3.7 3.5 3.7 3.9 3.6 3.6 3.7  CL  88* 85* 84* 84* 84* 85* 86*  CO2 29 30 31  33* 33* 30 27  GLUCOSE 111* 115* 110* 116* 99 109* 121*  BUN 11 15 14 17 20 22  31*  CREATININE 0.86 1.01 0.83 0.95 0.97 1.08 1.18  CALCIUM 8.7* 8.6* 8.4* 8.7* 8.7* 9.1 8.9  PHOS  --   --   --   --   --   --  4.1   CBC Recent Labs  Lab 10/15/20 0129 10/18/20 0106 10/19/20 0119 10/20/20 0728  WBC 8.7 8.7 7.6 8.4  NEUTROABS  --   --   --  6.4  HGB 13.6 14.3 14.1 15.1  HCT 40.9 42.9 43.2 45.4  MCV 86.5 86.3 87.4 86.3  PLT PLATELET CLUMPS NOTED ON SMEAR, UNABLE TO ESTIMATE PLATELET CLUMPS NOTED ON SMEAR, UNABLE TO ESTIMATE PLATELET CLUMPS NOTED ON SMEAR, UNABLE TO ESTIMATE PLATELET CLUMPS NOTED ON SMEAR, UNABLE TO ESTIMATE   Medications:     furosemide  40 mg Intravenous Q12H   metoprolol succinate  100 mg Oral Daily   sodium chloride flush  3 mL Intravenous Q12H   Warfarin - Pharmacist Dosing Inpatient   Does not apply q1600   Elmarie Shiley, MD 10/21/2020, 8:56 AM

## 2020-10-21 NOTE — Social Work (Signed)
CSW confirmed that [facility] can accept pt at discharge. CSW started insurance auth and it was instantly approved from 10/21/20- 10/23/20. Reference number [id number]. Auth ID has not been generated yet.   Emeterio Reeve, LCSW Clinical Social Worker

## 2020-10-21 NOTE — Progress Notes (Addendum)
PROGRESS NOTE    [name]  DVV:[id number] DOB: 1930-11-05 DOA: 10/13/2020 PCP: Gaynelle Arabian, MD    Brief Narrative: 87yo male with history of A. fib on chronic anticoagulation with Coumadin history of stroke venous stasis ulcers lives at home admitted with change in mental status hypoxia and fluid retention.  Patient medically stable for discharge, awaiting safe disposition, tentative plan for discharge to SNF once bed has been made available and insurance is approved.   Assessment & Plan:  Acute hypoxic respiratory failure secondary to acute diastolic heart failure exacerbation. Echo ejection fraction 60 to 65%.  Resolving Patient was hypoxic at 87% on room air on admission with 2+ bilateral pitting edema and chest x-ray consistent with pulmonary edema and elevated BNP. Lasix restarted 10/17/2020 with improvement in his symptoms Chest x-ray with improved pulmonary infiltrates and decreased right basilar atelectasis. IO remain negative  Acute metabolic encephalopathy-RESOLVED  Secondary to above Patient lives alone at home and able to perform ADLs - his son lives at the back of the house and keeps an eye on him but does not care for him 24/7 per previous discussion TSH normal Ammonia not elevated EEG no seizures or epileptiform discharges normal study.  Chronic afib on coumadin INR 3.0  Fall at home x-rays of the hip shows no acute fracture PT recommending SNF  Hypervolemic hyponatremia Likely multifactorial, improving since restarting Lasix.na 124.   Nephrology following. He was on HCTZ at home which has been stopped  Normal TSH   History of essential hypertension Stable on current meds  Chronic venous stasis dermatitis with 2 open wound on the right lower extremity seen by wound care nurse, POA.   Secure with foam dressing Wrap the right leg with Kerlix and Coban change 3 times a week.    Estimated body mass index is 29.17 kg/m as calculated from the  following:   Height as of 08/31/20: 5\' 10"  (1.778 m).   Weight as of this encounter: 92.2 kg.  DVT prophylaxis: coumadin Code Status:full Family Communication: dw son Disposition Plan:  Status is: Inpatient  Remains inpatient appropriate because:IV treatments appropriate due to intensity of illness or inability to take PO  Dispo: The patient is from: Home              Anticipated d/c is to: Home              Patient currently is not medically stable to d/c.   Difficult to place patient No   Consultants:  cardio  Procedures: none Antimicrobials: None  Subjective:  Patient is resting in bed eating breakfast He is awake and alert answers all questions appropriately and follows commands His son is by the bedside objective: Vitals:   10/20/20 1943 10/20/20 2341 10/21/20 0329 10/21/20 0753  BP: (!) 103/59 117/83 117/73 94/62  Pulse: 81 79 76 98  Resp: 20 13 16 20   Temp: 97.9 F (36.6 C) 98.3 F (36.8 C) 97.8 F (36.6 C) 97.9 F (36.6 C)  TempSrc: Oral Axillary Axillary Oral  SpO2: 92% 96% 95% 94%  Weight:   92.2 kg     Intake/Output Summary (Last 24 hours) at 10/21/2020 0756 Last data filed at 10/21/2020 0753 Gross per 24 hour  Intake 240 ml  Output 650 ml  Net -410 ml    Filed Weights   10/19/20 0508 10/20/20 0359 10/21/20 0329  Weight: 90.5 kg 91.6 kg 92.2 kg    Examination:  General exam: Appears calm and comfortable  Respiratory  system: Crackles at the bases to auscultation. Respiratory effort normal. Cardiovascular system: S1 & S2 heard, RRR. No JVD, murmurs, rubs, gallops or clicks. No pedal edema. Gastrointestinal system: Abdomen is nondistended, soft and nontender. No organomegaly or masses felt. Normal bowel sounds heard. Central nervous system: Alert and oriented. No focal neurological deficits. Extremities: 2+ bilateral pitting edema  skin: Right lower extremity to open wounds Psychiatry: Judgement and insight appear normal. Mood & affect  appropriate.     Data Reviewed: I have personally reviewed following labs and imaging studies  CBC: Recent Labs  Lab 10/14/20 1437 10/15/20 0129 10/18/20 0106 10/19/20 0119 10/20/20 0728  WBC  --  8.7 8.7 7.6 8.4  NEUTROABS  --   --   --   --  6.4  HGB 15.6 13.6 14.3 14.1 15.1  HCT 46.0 40.9 42.9 43.2 45.4  MCV  --  86.5 86.3 87.4 86.3  PLT  --  PLATELET CLUMPS NOTED ON SMEAR, UNABLE TO ESTIMATE PLATELET CLUMPS NOTED ON SMEAR, UNABLE TO ESTIMATE PLATELET CLUMPS NOTED ON SMEAR, UNABLE TO ESTIMATE PLATELET CLUMPS NOTED ON SMEAR, UNABLE TO ESTIMATE    Basic Metabolic Panel: Recent Labs  Lab 10/16/20 0158 10/17/20 0140 10/18/20 0106 10/19/20 0119 10/20/20 0728 10/21/20 0208  NA 122* 121* 124* 125* 124* 124*  K 3.5 3.7 3.9 3.6 3.6 3.7  CL 85* 84* 84* 84* 85* 86*  CO2 30 31 33* 33* 30 27  GLUCOSE 115* 110* 116* 99 109* 121*  BUN 15 14 17 20 22  31*  CREATININE 1.01 0.83 0.95 0.97 1.08 1.18  CALCIUM 8.6* 8.4* 8.7* 8.7* 9.1 8.9  MG 1.7 1.9 2.0 2.0  --   --   PHOS  --   --   --   --   --  4.1    GFR: Estimated Creatinine Clearance: 49.4 mL/min (by C-G formula based on SCr of 1.18 mg/dL). Liver Function Tests: Recent Labs  Lab 10/15/20 0129 10/20/20 0728 10/21/20 0208  AST 41 27  --   ALT 24 22  --   ALKPHOS 56 62  --   BILITOT 1.8* 1.5*  --   PROT 7.4 8.2*  --   ALBUMIN 3.2* 3.1* 2.7*    No results for input(s): LIPASE, AMYLASE in the last 168 hours. Recent Labs  Lab 10/14/20 1426  AMMONIA 23    Coagulation Profile: Recent Labs  Lab 10/17/20 0140 10/18/20 0106 10/19/20 0119 10/20/20 0314 10/21/20 0208  INR 2.5* 2.9* 3.0* 2.8* 2.7*    Cardiac Enzymes: Recent Labs  Lab 10/14/20 1426  CKTOTAL 413*    BNP (last 3 results) No results for input(s): PROBNP in the last 8760 hours. HbA1C: No results for input(s): HGBA1C in the last 72 hours. CBG: No results for input(s): GLUCAP in the last 168 hours. Lipid Profile: No results for input(s): CHOL,  HDL, LDLCALC, TRIG, CHOLHDL, LDLDIRECT in the last 72 hours.  Thyroid Function Tests: No results for input(s): TSH, T4TOTAL, FREET4, T3FREE, THYROIDAB in the last 72 hours.  Anemia Panel: No results for input(s): VITAMINB12, FOLATE, FERRITIN, TIBC, IRON, RETICCTPCT in the last 72 hours. Sepsis Labs: Recent Labs  Lab 10/14/20 1942  PROCALCITON <0.10     Recent Results (from the past 240 hour(s))  Resp Panel by RT-PCR (Flu A&B, Covid) Nasopharyngeal Swab     Status: None   Collection Time: 10/14/20 12:42 PM   Specimen: Nasopharyngeal Swab; Nasopharyngeal(NP) swabs in vial transport medium  Result Value Ref Range Status  SARS Coronavirus 2 by RT PCR NEGATIVE NEGATIVE Final    Comment: (NOTE) SARS-CoV-2 target nucleic acids are NOT DETECTED.  The SARS-CoV-2 RNA is generally detectable in upper respiratory specimens during the acute phase of infection. The lowest concentration of SARS-CoV-2 viral copies this assay can detect is 138 copies/mL. A negative result does not preclude SARS-Cov-2 infection and should not be used as the sole basis for treatment or other patient management decisions. A negative result may occur with  improper specimen collection/handling, submission of specimen other than nasopharyngeal swab, presence of viral mutation(s) within the areas targeted by this assay, and inadequate number of viral copies(<138 copies/mL). A negative result must be combined with clinical observations, patient history, and epidemiological information. The expected result is Negative.  Fact Sheet for Patients:  EntrepreneurPulse.com.au  Fact Sheet for Healthcare Providers:  IncredibleEmployment.be  This test is no t yet approved or cleared by the [place] FDA and  has been authorized for detection and/or diagnosis of SARS-CoV-2 by FDA under an Emergency Use Authorization (EUA). This EUA will remain  in effect (meaning this test can be  used) for the duration of the COVID-19 declaration under Section 564(b)(1) of the Act, 21 U.S.C.section 360bbb-3(b)(1), unless the authorization is terminated  or revoked sooner.       Influenza A by PCR NEGATIVE NEGATIVE Final   Influenza B by PCR NEGATIVE NEGATIVE Final    Comment: (NOTE) The Xpert Xpress SARS-CoV-2/FLU/RSV plus assay is intended as an aid in the diagnosis of influenza from Nasopharyngeal swab specimens and should not be used as a sole basis for treatment. Nasal washings and aspirates are unacceptable for Xpert Xpress SARS-CoV-2/FLU/RSV testing.  Fact Sheet for Patients: EntrepreneurPulse.com.au  Fact Sheet for Healthcare Providers: IncredibleEmployment.be  This test is not yet approved or cleared by the [place] FDA and has been authorized for detection and/or diagnosis of SARS-CoV-2 by FDA under an Emergency Use Authorization (EUA). This EUA will remain in effect (meaning this test can be used) for the duration of the COVID-19 declaration under Section 564(b)(1) of the Act, 21 U.S.C. section 360bbb-3(b)(1), unless the authorization is terminated or revoked.  Performed at [facility], [place] [address], [place], [place] [address]   Culture, blood (routine x 2)     Status: None   Collection Time: 10/14/20  9:25 PM   Specimen: BLOOD  Result Value Ref Range Status   Specimen Description BLOOD RIGHT ANTECUBITAL  Final   Special Requests   Final    BOTTLES DRAWN AEROBIC AND ANAEROBIC Blood Culture adequate volume   Culture   Final    NO GROWTH 5 DAYS Performed at [facility], [place] [address], [place], [place] [address]    Report Status 10/19/2020 FINAL  Final  Culture, blood (routine x 2)     Status: None   Collection Time: 10/14/20  9:25 PM   Specimen: BLOOD RIGHT HAND  Result Value Ref Range Status   Specimen Description BLOOD RIGHT HAND  Final   Special Requests   Final    BOTTLES DRAWN AEROBIC AND  ANAEROBIC Blood Culture adequate volume   Culture   Final    NO GROWTH 5 DAYS Performed at [facility], [place] [address] [place], [place] [address]    Report Status 10/19/2020 FINAL  Final          Radiology Studies: No results found.      Scheduled Meds:  furosemide  40 mg Intravenous Q12H  metoprolol succinate  100 mg Oral Daily   sodium chloride flush  3 mL Intravenous Q12H   Warfarin - Pharmacist Dosing Inpatient   Does not apply q1600   Continuous Infusions:   LOS: 7 days    Time spent: 39 min   Little Ishikawa, DO  10/21/2020, 7:56 AM

## 2020-10-22 DIAGNOSIS — R2689 Other abnormalities of gait and mobility: Secondary | ICD-10-CM | POA: Diagnosis not present

## 2020-10-22 DIAGNOSIS — R296 Repeated falls: Secondary | ICD-10-CM | POA: Diagnosis not present

## 2020-10-22 DIAGNOSIS — E871 Hypo-osmolality and hyponatremia: Secondary | ICD-10-CM | POA: Diagnosis not present

## 2020-10-22 DIAGNOSIS — Z9181 History of falling: Secondary | ICD-10-CM | POA: Diagnosis not present

## 2020-10-22 DIAGNOSIS — I5043 Acute on chronic combined systolic (congestive) and diastolic (congestive) heart failure: Secondary | ICD-10-CM | POA: Diagnosis not present

## 2020-10-22 DIAGNOSIS — M6281 Muscle weakness (generalized): Secondary | ICD-10-CM | POA: Diagnosis not present

## 2020-10-22 DIAGNOSIS — G9341 Metabolic encephalopathy: Secondary | ICD-10-CM | POA: Diagnosis not present

## 2020-10-22 DIAGNOSIS — R6889 Other general symptoms and signs: Secondary | ICD-10-CM | POA: Diagnosis not present

## 2020-10-22 DIAGNOSIS — W19XXXA Unspecified fall, initial encounter: Secondary | ICD-10-CM | POA: Diagnosis not present

## 2020-10-22 DIAGNOSIS — R55 Syncope and collapse: Secondary | ICD-10-CM | POA: Diagnosis not present

## 2020-10-22 DIAGNOSIS — J9601 Acute respiratory failure with hypoxia: Secondary | ICD-10-CM | POA: Diagnosis not present

## 2020-10-22 DIAGNOSIS — R7989 Other specified abnormal findings of blood chemistry: Secondary | ICD-10-CM | POA: Diagnosis not present

## 2020-10-22 DIAGNOSIS — Z743 Need for continuous supervision: Secondary | ICD-10-CM | POA: Diagnosis not present

## 2020-10-22 DIAGNOSIS — R278 Other lack of coordination: Secondary | ICD-10-CM | POA: Diagnosis not present

## 2020-10-22 DIAGNOSIS — I4891 Unspecified atrial fibrillation: Secondary | ICD-10-CM | POA: Diagnosis not present

## 2020-10-22 DIAGNOSIS — R0902 Hypoxemia: Secondary | ICD-10-CM | POA: Diagnosis not present

## 2020-10-22 DIAGNOSIS — I482 Chronic atrial fibrillation, unspecified: Secondary | ICD-10-CM | POA: Diagnosis not present

## 2020-10-22 DIAGNOSIS — I110 Hypertensive heart disease with heart failure: Secondary | ICD-10-CM | POA: Diagnosis not present

## 2020-10-22 DIAGNOSIS — I83009 Varicose veins of unspecified lower extremity with ulcer of unspecified site: Secondary | ICD-10-CM | POA: Diagnosis not present

## 2020-10-22 DIAGNOSIS — Y92009 Unspecified place in unspecified non-institutional (private) residence as the place of occurrence of the external cause: Secondary | ICD-10-CM | POA: Diagnosis not present

## 2020-10-22 DIAGNOSIS — Z7401 Bed confinement status: Secondary | ICD-10-CM | POA: Diagnosis not present

## 2020-10-22 DIAGNOSIS — I5031 Acute diastolic (congestive) heart failure: Secondary | ICD-10-CM | POA: Diagnosis not present

## 2020-10-22 DIAGNOSIS — Z23 Encounter for immunization: Secondary | ICD-10-CM | POA: Diagnosis not present

## 2020-10-22 DIAGNOSIS — I10 Essential (primary) hypertension: Secondary | ICD-10-CM | POA: Diagnosis not present

## 2020-10-22 LAB — PROTIME-INR
INR: 2.5 — ABNORMAL HIGH (ref 0.8–1.2)
Prothrombin Time: 27.3 seconds — ABNORMAL HIGH (ref 11.4–15.2)

## 2020-10-22 LAB — RENAL FUNCTION PANEL
Albumin: 2.8 g/dL — ABNORMAL LOW (ref 3.5–5.0)
Anion gap: 10 (ref 5–15)
BUN: 32 mg/dL — ABNORMAL HIGH (ref 8–23)
CO2: 30 mmol/L (ref 22–32)
Calcium: 9 mg/dL (ref 8.9–10.3)
Chloride: 85 mmol/L — ABNORMAL LOW (ref 98–111)
Creatinine, Ser: 1.31 mg/dL — ABNORMAL HIGH (ref 0.61–1.24)
GFR, Estimated: 52 mL/min — ABNORMAL LOW (ref >60)
Glucose, Bld: 102 mg/dL — ABNORMAL HIGH (ref 70–99)
Phosphorus: 3.8 mg/dL (ref 2.5–4.6)
Potassium: 3.8 mmol/L (ref 3.5–5.1)
Sodium: 125 mmol/L — ABNORMAL LOW (ref 135–145)

## 2020-10-22 MED ORDER — METOPROLOL SUCCINATE ER 100 MG PO TB24: 100.0000 mg | ORAL_TABLET | Freq: Every day | ORAL | 0 refills | Status: DC

## 2020-10-22 MED ORDER — WARFARIN SODIUM 5 MG PO TABS
5.0000 mg | ORAL_TABLET | Freq: Once | ORAL | Status: AC
  Administered 2020-10-22: 5 mg via ORAL
  Filled 2020-10-22: qty 1

## 2020-10-22 MED ORDER — FUROSEMIDE 40 MG PO TABS: 40.0000 mg | ORAL_TABLET | Freq: Every day | ORAL | 0 refills | Status: AC

## 2020-10-22 MED ORDER — TRAMADOL HCL 50 MG PO TABS: 50.0000 mg | ORAL_TABLET | Freq: Four times a day (QID) | ORAL | 0 refills | Status: DC | PRN

## 2020-10-22 MED ORDER — TOLVAPTAN 15 MG PO TABS
15.0000 mg | ORAL_TABLET | Freq: Once | ORAL | Status: AC
  Administered 2020-10-22: 15 mg via ORAL
  Filled 2020-10-22: qty 1

## 2020-10-22 NOTE — Progress Notes (Signed)
ANTICOAGULATION CONSULT NOTE   Pharmacy Consult for warfarin Indication: atrial fibrillation  No Known Allergies  Patient Measurements: Height: 5\' 10"  (177.8 cm) Weight: 90.2 kg (198 lb 13.7 oz) IBW/kg (Calculated) : 73 Heparin Dosing Weight: n/a  Vital Signs: Temp: 97.3 F (36.3 C) (10/13 0735) Temp Source: Oral (10/13 0735) BP: 111/72 (10/13 0735) Pulse Rate: 97 (10/13 0735)  Labs: Recent Labs    10/20/20 0314 10/20/20 0728 10/21/20 0208 10/22/20 0451  HGB  --  15.1  --   --   HCT  --  45.4  --   --   PLT  --  PLATELET CLUMPS NOTED ON SMEAR, UNABLE TO ESTIMATE  --   --   LABPROT 29.6*  --  28.9* 27.3*  INR 2.8*  --  2.7* 2.5*  CREATININE  --  1.08 1.18 1.31*     Estimated Creatinine Clearance: 44.1 mL/min (A) (by C-G formula based on SCr of 1.31 mg/dL (H)).   Assessment: 55 yoM on warfarin PTA for afib presenting s/p fall. Imaging negative for bleed. Pharmacy consulted to dose warfarin inpatient.   PTA warfarin dose: 5mg  daily except 7.5mg  on Mon (last dose 10/4 PTA)  INR at goal 2.5  Goal of Therapy:  INR 2-3 Monitor platelets by anticoagulation protocol: Yes   Plan:  Warfarin 5 mg po x 1 dose tonight Daily INR, s/sx of bleeding  Thank you Anette Guarneri, PharmD 10/22/2020 8:06 AM

## 2020-10-22 NOTE — Progress Notes (Addendum)
Pt being d/c, VSS, IV removed, Education provided to family.  Report has been called to [facility].    Chrisandra Carota, RN 10/22/2020 3:13 PM

## 2020-10-22 NOTE — Progress Notes (Signed)
Mobility Specialist: Progress Note   10/22/20 1242  Mobility  Activity Transferred:  Bed to chair  Level of Assistance +2 (takes two people)  Games developer wheel walker  Distance Ambulated (ft) 2 ft  Mobility Out of bed to chair with meals  Mobility Response Tolerated well  Mobility performed by Mobility specialist;Nurse tech;Nurse  Bed Position Chair  $Mobility charge 1 Mobility   Pt required modA to sit EOB from supine and +2 physical assistance to stand and pivot to the chair from the bed with bed height elevated. Pt had no c/o throughout. Pt is in the chair with RN and NT present in the room.   [facility] Julieanne Hadsall Mobility Specialist Mobility Specialist Phone: [phone]

## 2020-10-22 NOTE — Discharge Summary (Signed)
Physician Discharge Summary  [name] LDJ:[id number] DOB: 11/14/1930 DOA: 10/13/2020  PCP: Gaynelle Arabian, MD  Admit date: 10/13/2020 Discharge date: 10/22/2020  Admitted From: Home Disposition: SNF  Recommendations for Outpatient Follow-up:  Follow up with PCP in 1-2 weeks Please obtain BMP/CBC in one week  Discharge Condition: Stable CODE STATUS: Full Diet recommendation: Low-salt low-fat diet  Brief/Interim Summary: 87yo male with history of A. fib on chronic anticoagulation with Coumadin history of stroke venous stasis ulcers lives at home admitted with change in mental status hypoxia and fluid retention.   Patient medically stable for discharge, awaiting safe disposition, tentative plan for discharge to SNF once bed has been made available and insurance is approved.     Assessment & Plan:   Acute hypoxic respiratory failure secondary to acute diastolic heart failure exacerbation. Echo ejection fraction 60 to 65%.  Resolving Patient was hypoxic at 87% on room air on admission with 2+ bilateral pitting edema and chest x-ray consistent with pulmonary edema and elevated BNP. Lasix restarted 10/17/2020 with improvement in his symptoms Chest x-ray with improved pulmonary infiltrates and decreased right basilar atelectasis. IO remain negative   Acute metabolic encephalopathy-RESOLVED  Secondary to above Patient lives alone at home and able to perform ADLs - his son lives at the back of the house and keeps an eye on him but does not care for him 24/7 per previous discussion TSH normal Ammonia not elevated EEG no seizures or epileptiform discharges normal study.   Chronic afib on coumadin INR 3.0   Fall at home x-rays of the hip shows no acute fracture PT recommending SNF   Hypervolemic hyponatremia Likely multifactorial, improving since restarting Lasix.na 124.   Nephrology following. He was on HCTZ at home which has been stopped  Normal TSH    History of essential  hypertension Stable on current meds   Chronic venous stasis dermatitis with 2 open wound on the right lower extremity seen by wound care nurse, POA.   Secure with foam dressing Wrap the right leg with Kerlix and Coban change 3 times a week.   Discharge Instructions  Discharge Instructions     Diet - low sodium heart healthy   Complete by: As directed    Discharge wound care:   Complete by: As directed    Apply a small piece of Aquacel Advantage Kellie Simmering # [phone]) over the 2 open wounds on the RLE, secure with foam dressing. Wrap the right leg with Kerlix and Coban Kellie Simmering # [phone]). Change on Tu, Th, Sa. Use NS to loosen the Aquacel when removing if stuck to the wound   Increase activity slowly   Complete by: As directed       Allergies as of 10/22/2020   No Known Allergies      Medication List     STOP taking these medications    hydrochlorothiazide 12.5 MG tablet Commonly known as: HYDRODIURIL   lidocaine 5 % Commonly known as: Lidoderm   lisinopril 10 MG tablet Commonly known as: ZESTRIL       TAKE these medications    acetaminophen 500 MG tablet Commonly known as: TYLENOL Take 500-1,000 mg by mouth every 6 (six) hours as needed (FOR PAIN.).   albuterol 108 (90 Base) MCG/ACT inhaler Commonly known as: VENTOLIN HFA Inhale 1 puff into the lungs every 4 (four) hours as needed for wheezing or shortness of breath.   aspirin EC 81 MG tablet Take 81 mg by mouth at bedtime.   calcium carbonate  600 MG Tabs tablet Commonly known as: OS-CAL Take 600 mg by mouth daily.   furosemide 40 MG tablet Commonly known as: LASIX Take 1 tablet (40 mg total) by mouth daily. Start taking on: October 23, 2020   GLUCOSAMINE PO Take 1,500 mg by mouth daily.   metoprolol succinate 100 MG 24 hr tablet Commonly known as: TOPROL-XL Take 1 tablet (100 mg total) by mouth daily. Take with or immediately following a meal. Start taking on: October 23, 2020 What changed:   medication strength how much to take additional instructions Another medication with the same name was removed. Continue taking this medication, and follow the directions you see here.   multivitamin tablet Take 1 tablet by mouth daily.   PreserVision AREDS 2 Caps Take 1 capsule by mouth 2 (two) times daily.   traMADol 50 MG tablet Commonly known as: ULTRAM Take 1 tablet (50 mg total) by mouth every 6 (six) hours as needed. What changed: reasons to take this   warfarin 5 MG tablet Commonly known as: COUMADIN Take as directed. If you are unsure how to take this medication, talk to your nurse or doctor. Original instructions: TAKE AS DIRECTED PER ANTICOAGULATION CLINIC What changed: See the new instructions.               Discharge Care Instructions  (From admission, onward)           Start     Ordered   10/22/20 0000  Discharge wound care:       Comments: Apply a small piece of Aquacel Advantage Kellie Simmering # [phone]) over the 2 open wounds on the RLE, secure with foam dressing. Wrap the right leg with Kerlix and Coban Kellie Simmering # [id number]). Change on Tu, Th, Sa. Use NS to loosen the Aquacel when removing if stuck to the wound   10/22/20 1417            Follow-up Information     Richardson Dopp T, PA-C Follow up.   Specialties: Cardiology, Physician Assistant Why: [place] location - a cardiology follow-up has been arranged for you on Tuesday Nov 10, 2020 11:15 AM (Arrive by 11:00 AM). Nicki Reaper is one of our PAs with our team. Contact information: [address] [address] [phone]                No Known Allergies   Procedures/Studies: DG Chest 1 View  Result Date: 10/17/2020 CLINICAL DATA:  Edema EXAM: CHEST  1 VIEW COMPARISON:  10/14/2020 chest radiograph. FINDINGS: Low lung volumes. Stable cardiomediastinal silhouette with mild cardiomegaly. No pneumothorax. Blunting of the right costophrenic angle, similar,  favor atelectasis. No left pleural effusion. Diffuse patchy hazy and linear opacities in both lungs, improved. Mild platelike right lung base atelectasis, similar. IMPRESSION: 1. Low lung volumes. 2. Stable mild cardiomegaly. Decreased diffuse patchy hazy and linear opacities in both lungs, compatible with improving pulmonary edema. 3. Mild platelike right lung base atelectasis. Electronically Signed   By: Ilona Sorrel M.D.   On: 10/17/2020 10:33   DG Chest 2 View  Result Date: 10/14/2020 CLINICAL DATA:  Syncope ,feeling weak,fell down , some sobfall/syncope EXAM: CHEST - 2 VIEW COMPARISON:  Chest x-ray 2011, shoulder radiograph 08/31/2020 FINDINGS: Stable cardiac silhouette. There is linear interstitial thickening in the lungs. Chronic elevation RIGHT hemidiaphragm. No focal consolidation. No pneumothorax. Irregular margin of the LEFT humeral head. IMPRESSION: 1. Interstitial edema pattern. 2. Low lung volumes. 3. LEFT humeral  head fracture versus severe arthropathy. Consider dedicated view of the LEFT shoulder Electronically Signed   By: Suzy Bouchard M.D.   On: 10/14/2020 08:40   DG Pelvis 1-2 Views  Result Date: 10/13/2020 CLINICAL DATA:  Tailbone pain EXAM: PELVIS - 1-2 VIEW COMPARISON:  06/09/2015 FINDINGS: SI joints are non widened. The pubic symphysis and rami are intact. Right hip replacement with intact hardware and normal alignment. Vascular calcifications. There are mild degenerative changes of the left hip. IMPRESSION: Right hip replacement without acute osseous abnormality. Electronically Signed   By: Donavan Foil M.D.   On: 10/13/2020 16:51   CT HEAD WO CONTRAST (5MM)  Result Date: 10/14/2020 CLINICAL DATA:  87 year old male with history of trauma from a fall at home yesterday. EXAM: CT HEAD WITHOUT CONTRAST CT CERVICAL SPINE WITHOUT CONTRAST TECHNIQUE: Multidetector CT imaging of the head and cervical spine was performed following the standard protocol without intravenous contrast.  Multiplanar CT image reconstructions of the cervical spine were also generated. COMPARISON:  Head CT 11/13/2015.  Cervical spine CT 10/21/2015. FINDINGS: CT HEAD FINDINGS Brain: Moderate cerebral atrophy with ex vacuo dilatation of the ventricular system. Patchy and confluent areas of decreased attenuation are noted throughout the deep and periventricular white matter of the cerebral hemispheres bilaterally, compatible with chronic microvascular ischemic disease. Well-defined areas of low attenuation in the left occipital lobe posteromedially, similar to the prior study, compatible with an area of encephalomalacia from remote left PCA territory infarct. Cavum septum pellucidum (normal anatomical variant). No evidence of acute infarction, hemorrhage, hydrocephalus, extra-axial collection or mass lesion/mass effect. Vascular: No hyperdense vessel or unexpected calcification. Skull: Normal. Negative for fracture or focal lesion. Sinuses/Orbits: No acute finding. Other: None. CT CERVICAL SPINE FINDINGS Alignment: Normal. Skull base and vertebrae: No acute fracture. No primary bone lesion or focal pathologic process. Soft tissues and spinal canal: No prevertebral fluid or swelling. No visible canal hematoma. Disc levels: Severe multilevel degenerative disc disease, most pronounced at C4-C5, C5-C6 and. Severe multilevel facet arthropathy bilaterally. Upper chest: Areas of ground-glass attenuation and interlobular septal thickening in the visualize lungs, concerning for pulmonary edema. Other: None. IMPRESSION: 1. No evidence of significant acute traumatic injury to the skull, brain or cervical spine. 2. Moderate cerebral atrophy with extensive chronic microvascular ischemic changes and old left PCA territory infarct. 3. The appearance of the lung apices suggests pulmonary edema. Correlation with chest radiography is recommended. 4. Multilevel degenerative disc disease and cervical spondylosis, as above. Electronically  Signed   By: Vinnie Langton M.D.   On: 10/14/2020 08:33   CT Cervical Spine Wo Contrast  Result Date: 10/14/2020 CLINICAL DATA:  87 year old male with history of trauma from a fall at home yesterday. EXAM: CT HEAD WITHOUT CONTRAST CT CERVICAL SPINE WITHOUT CONTRAST TECHNIQUE: Multidetector CT imaging of the head and cervical spine was performed following the standard protocol without intravenous contrast. Multiplanar CT image reconstructions of the cervical spine were also generated. COMPARISON:  Head CT 11/13/2015.  Cervical spine CT 10/21/2015. FINDINGS: CT HEAD FINDINGS Brain: Moderate cerebral atrophy with ex vacuo dilatation of the ventricular system. Patchy and confluent areas of decreased attenuation are noted throughout the deep and periventricular white matter of the cerebral hemispheres bilaterally, compatible with chronic microvascular ischemic disease. Well-defined areas of low attenuation in the left occipital lobe posteromedially, similar to the prior study, compatible with an area of encephalomalacia from remote left PCA territory infarct. Cavum septum pellucidum (normal anatomical variant). No evidence of acute infarction, hemorrhage, hydrocephalus, extra-axial collection  or mass lesion/mass effect. Vascular: No hyperdense vessel or unexpected calcification. Skull: Normal. Negative for fracture or focal lesion. Sinuses/Orbits: No acute finding. Other: None. CT CERVICAL SPINE FINDINGS Alignment: Normal. Skull base and vertebrae: No acute fracture. No primary bone lesion or focal pathologic process. Soft tissues and spinal canal: No prevertebral fluid or swelling. No visible canal hematoma. Disc levels: Severe multilevel degenerative disc disease, most pronounced at C4-C5, C5-C6 and. Severe multilevel facet arthropathy bilaterally. Upper chest: Areas of ground-glass attenuation and interlobular septal thickening in the visualize lungs, concerning for pulmonary edema. Other: None. IMPRESSION: 1.  No evidence of significant acute traumatic injury to the skull, brain or cervical spine. 2. Moderate cerebral atrophy with extensive chronic microvascular ischemic changes and old left PCA territory infarct. 3. The appearance of the lung apices suggests pulmonary edema. Correlation with chest radiography is recommended. 4. Multilevel degenerative disc disease and cervical spondylosis, as above. Electronically Signed   By: Vinnie Langton M.D.   On: 10/14/2020 08:33   CT Lumbar Spine Wo Contrast  Result Date: 10/14/2020 CLINICAL DATA:  87 year old male status post fall at home yesterday. EXAM: CT LUMBAR SPINE WITHOUT CONTRAST TECHNIQUE: Multidetector CT imaging of the lumbar spine was performed without intravenous contrast administration. Multiplanar CT image reconstructions were also generated. COMPARISON:  [facility] CT Abdomen and Pelvis 01/06/2012 FINDINGS: Segmentation: Normal. Alignment: Stable lumbar lordosis since 2013. No significant spondylolisthesis. Vertebrae: Osteopenia. Chronic degenerative S1 superior endplate sclerosis appears stable. Stable lumbar and lower thoracic vertebral height and alignment. Visible sacrum and SI joints appear intact. Visible posterior lower ribs appear intact. No acute osseous abnormality identified. Paraspinal and other soft tissues: Aortoiliac calcified atherosclerosis. Normal caliber abdominal aorta. Stable visible abdominal viscera. Motion artifact at the left costophrenic angle. Lumbar paraspinal soft tissues are normal except for L2-L3 on the left which is detailed below. Disc levels: Widespread lumbar spine degeneration. Vacuum disc L2-L3 through L5-S1. Chronic appearing degenerative and multifactorial spinal stenosis does not appear significantly changed since 2013, severe at L4-L5. Superimposed chronic oval soft tissue mass occupying the left L2 neural foramen which is smoothly expanded. This mass is best demonstrated on series 1, image 58 and  measures 15 x 28 x 13 mm (AP by transverse by CC). It was present in 2013, and is not significantly changed in size or configuration since that time (series 3, image 40 of that exam). IMPRESSION: 1.  No acute traumatic injury identified in the lumbar spine. 2. 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 This has not significantly changed since 2013. 3. Chronic lumbar spine degeneration. Severe chronic multifactorial spinal stenosis at L4-L5. 4.  Aortic Atherosclerosis (ICD10-I70.0). Electronically Signed   By: Genevie Ann M.D.   On: 10/14/2020 08:45   CT PELVIS WO CONTRAST  Result Date: 10/14/2020 CLINICAL DATA:  87 year old male status post fall yesterday. EXAM: CT PELVIS WITHOUT CONTRAST TECHNIQUE: Multidetector CT imaging of the pelvis was performed following the standard protocol without intravenous contrast. COMPARISON:  Lumbar spine CT today. [facility] CT Abdomen and Pelvis 01/06/2012. FINDINGS: Urinary Tract: Kidneys not included. Chronic postoperative changes to the prostate and floor of the bladder. Streak artifact from chronic right hip arthroplasty. The bladder appears stable since 2013. No hydroureter. Bowel: Nondilated visible large and small bowel. Diverticulosis of the sigmoid colon with no active inflammation identified. No free air or free fluid. Vascular/Lymphatic: Aortoiliac calcified atherosclerosis. No lymphadenopathy. Reproductive: Chronic postoperative changes to the prostate,  otherwise negative. Other:  Chronic pelvic phleboliths. Musculoskeletal: Sacrum, SI joints, pelvis, left hip, proximal left femur, and pubic rami appear stable since 2013 and intact. Chronic right total hip arthroplasty with regional streak artifact and heterotopic ossification, not significantly changed since 2013. The entire right femoral component is not included on the CT images but is visible on the scout view. No acute  osseous abnormality identified. However, there is some evidence of superficial subcutaneous hematoma or contusion overlying the posterolateral right hip on series 5, image 31, new compared to 2013. But no other superficial soft tissue injury is identified. IMPRESSION: 1. Superficial soft tissue contusion or hematoma overlying the posterolateral right hip. Underlying right hip arthroplasty appears intact. 2. No other acute traumatic injury identified. Electronically Signed   By: Genevie Ann M.D.   On: 10/14/2020 09:04   DG CHEST PORT 1 VIEW  Result Date: 10/18/2020 CLINICAL DATA:  Shortness of breath EXAM: PORTABLE CHEST 1 VIEW COMPARISON:  Portable exam 0742 hours compared to 10/17/2020 FINDINGS: Enlargement of cardiac silhouette. Atherosclerotic calcification aorta. Diffuse interstitial infiltrates, improved. Residual RIGHT basilar atelectasis. No pneumothorax or acute osseous findings. Old healed RIGHT midclavicular fracture with deformity. IMPRESSION: Improved pulmonary infiltrates and decreased RIGHT basilar atelectasis. Enlargement of cardiac silhouette. Aortic Atherosclerosis (ICD10-I70.0). Electronically Signed   By: Lavonia Dana M.D.   On: 10/18/2020 09:43   DG Shoulder Left  Result Date: 10/14/2020 CLINICAL DATA:  Fall, pain in left shoulder EXAM: LEFT SHOULDER - 2+ VIEW COMPARISON:  Shoulder radiographs 08/31/2020 FINDINGS: No acute fracture or dislocation is seen. Advanced degenerative changes of the glenohumeral joint are again seen. The soft tissues are unremarkable. IMPRESSION: No acute fracture or dislocation identified. Electronically Signed   By: Valetta Mole M.D.   On: 10/14/2020 09:59   EEG adult  Result Date: 10/15/2020 Lora Havens, MD     10/15/2020 11:02 AM Patient Name: ROCKFORD LEINEN MRN: [id number] Epilepsy Attending: Lora Havens Referring Physician/Provider: Dr Fuller Plan Date: 10/15/2020 Duration: 23.41 mins Patient history: 87 year old male with altered mental status.   EEG to evaluate for seizures. Level of alertness: Awake AEDs during EEG study: None Technical aspects: This EEG study was done with scalp electrodes positioned according to the 10-20 International system of electrode placement. Electrical activity was acquired at a sampling rate of 500Hz  and reviewed with a high frequency filter of 70Hz  and a low frequency filter of 1Hz . EEG data were recorded continuously and digitally stored. Description: The posterior dominant rhythm consists of 8 Hz activity of moderate voltage (25-35 uV) seen predominantly in posterior head regions, symmetric and reactive to eye opening and eye closing. Hyperventilation and photic stimulation were not performed.   IMPRESSION: This study is within normal limits. No seizures or epileptiform discharges were seen throughout the recording. Lora Havens   ECHOCARDIOGRAM COMPLETE  Result Date: 10/15/2020    ECHOCARDIOGRAM REPORT   Patient Name:   TAYSHAWN PURNELL Date of Exam: 10/15/2020 Medical Rec #:  [id number]     Height:       70.0 in Accession #:    [id number]    Weight:       215.8 lb Date of Birth:  [birth year]     BSA:          2.156 m Patient Age:    46 years      BP:           141/88 mmHg Patient Gender: M  HR:           111 bpm. Exam Location:  Inpatient Procedure: 2D Echo, Cardiac Doppler and Color Doppler Indications:    CHF-Acute Diastolic  History:        Patient has prior history of Echocardiogram examinations, most                 recent 01/10/2007. Risk Factors:Morbid Obesity.  Sonographer:    Merrie Roof RDCS Referring Phys: [id number] [place]  1. Left ventricular ejection fraction, by estimation, is 60 to 65%. The left ventricle has normal function. The left ventricle has no regional wall motion abnormalities. There is mild left ventricular hypertrophy. Left ventricular diastolic function could not be evaluated.  2. Right ventricular systolic function is normal. The right ventricular size is normal.  There is mildly elevated pulmonary artery systolic pressure.  3. Left atrial size was severely dilated.  4. The mitral valve is grossly normal. Mild mitral valve regurgitation.  5. The aortic valve is tricuspid. There is mild calcification of the aortic valve. There is mild thickening of the aortic valve. Aortic valve regurgitation is not visualized. FINDINGS  Left Ventricle: Left ventricular ejection fraction, by estimation, is 60 to 65%. The left ventricle has normal function. The left ventricle has no regional wall motion abnormalities. The left ventricular internal cavity size was normal in size. There is  mild left ventricular hypertrophy. Left ventricular diastolic function could not be evaluated due to atrial fibrillation. Left ventricular diastolic function could not be evaluated. Right Ventricle: The right ventricular size is normal. Right vetricular wall thickness was not well visualized. Right ventricular systolic function is normal. There is mildly elevated pulmonary artery systolic pressure. The tricuspid regurgitant velocity  is 2.93 m/s, and with an assumed right atrial pressure of 3 mmHg, the estimated right ventricular systolic pressure is 65.7 mmHg. Left Atrium: Left atrial size was severely dilated. Right Atrium: Right atrial size was normal in size. Pericardium: There is no evidence of pericardial effusion. Mitral Valve: The mitral valve is grossly normal. There is mild thickening of the mitral valve leaflet(s). There is mild calcification of the mitral valve leaflet(s). Mild to moderate mitral annular calcification. Mild mitral valve regurgitation. Tricuspid Valve: The tricuspid valve is grossly normal. Tricuspid valve regurgitation is mild. Aortic Valve: The aortic valve is tricuspid. There is mild calcification of the aortic valve. There is mild thickening of the aortic valve. Aortic valve regurgitation is not visualized. Aortic valve mean gradient measures 4.0 mmHg. Aortic valve peak gradient  measures 7.4 mmHg. Aortic valve area, by VTI measures 2.96 cm. Pulmonic Valve: The pulmonic valve was grossly normal. Pulmonic valve regurgitation is not visualized. Aorta: The aortic root and ascending aorta are structurally normal, with no evidence of dilitation. IAS/Shunts: The atrial septum is grossly normal.  LEFT VENTRICLE PLAX 2D LVIDd:         4.60 cm LVIDs:         3.30 cm LV PW:         1.30 cm LV IVS:        1.30 cm LVOT diam:     2.30 cm LV SV:         61 LV SV Index:   28 LVOT Area:     4.15 cm  RIGHT VENTRICLE RV Basal diam:  3.20 cm RV S prime:     10.30 cm/s TAPSE (M-mode): 1.4 cm LEFT ATRIUM  Index        RIGHT ATRIUM           Index LA diam:        4.80 cm  2.23 cm/m   RA Area:     12.80 cm LA Vol (A2C):   118.0 ml 54.73 ml/m  RA Volume:   26.00 ml  12.06 ml/m LA Vol (A4C):   96.8 ml  44.90 ml/m LA Biplane Vol: 112.0 ml 51.95 ml/m  AORTIC VALVE AV Area (Vmax):    2.79 cm AV Area (Vmean):   2.84 cm AV Area (VTI):     2.96 cm AV Vmax:           136.00 cm/s AV Vmean:          92.600 cm/s AV VTI:            0.205 m AV Peak Grad:      7.4 mmHg AV Mean Grad:      4.0 mmHg LVOT Vmax:         91.30 cm/s LVOT Vmean:        63.200 cm/s LVOT VTI:          0.146 m LVOT/AV VTI ratio: 0.71  AORTA Ao Root diam: 3.40 cm TRICUSPID VALVE TR Peak grad:   34.3 mmHg TR Vmax:        293.00 cm/s  SHUNTS Systemic VTI:  0.15 m Systemic Diam: 2.30 cm Mertie Moores MD Electronically signed by Mertie Moores MD Signature Date/Time: 10/15/2020/4:41:41 PM    Final      Subjective: No acute issues or events overnight, son at bedside, patient otherwise stable and agreeable for discharge   Discharge Exam: Vitals:   10/22/20 0735 10/22/20 1220  BP: 111/72 (!) 143/78  Pulse: 97 93  Resp: 18 17  Temp: (!) 97.3 F (36.3 C) (!) 97.4 F (36.3 C)  SpO2: 91% 98%   Vitals:   10/22/20 0039 10/22/20 0512 10/22/20 0735 10/22/20 1220  BP: 104/70 116/62 111/72 (!) 143/78  Pulse: 85  97 93  Resp: 15 19  18 17   Temp: 97.6 F (36.4 C) 97.6 F (36.4 C) (!) 97.3 F (36.3 C) (!) 97.4 F (36.3 C)  TempSrc: Oral Axillary Oral Oral  SpO2: 94% 94% 91% 98%  Weight:  90.2 kg 90.2 kg   Height:   5\' 10"  (1.778 m)     General: Pt is alert, awake, not in acute distress Cardiovascular: RRR, S1/S2 +, no rubs, no gallops Respiratory: CTA bilaterally, no wheezing, no rhonchi Abdominal: Soft, NT, ND, bowel sounds + Extremities: no edema, no cyanosis    The results of significant diagnostics from this hospitalization (including imaging, microbiology, ancillary and laboratory) are listed below for reference.     Microbiology: Recent Results (from the past 240 hour(s))  Resp Panel by RT-PCR (Flu A&B, Covid) Nasopharyngeal Swab     Status: None   Collection Time: 10/14/20 12:42 PM   Specimen: Nasopharyngeal Swab; Nasopharyngeal(NP) swabs in vial transport medium  Result Value Ref Range Status   SARS Coronavirus 2 by RT PCR NEGATIVE NEGATIVE Final    Comment: (NOTE) SARS-CoV-2 target nucleic acids are NOT DETECTED.  The SARS-CoV-2 RNA is generally detectable in upper respiratory specimens during the acute phase of infection. The lowest concentration of SARS-CoV-2 viral copies this assay can detect is 138 copies/mL. A negative result does not preclude SARS-Cov-2 infection and should not be used as the sole basis for treatment or other patient management decisions.  A negative result may occur with  improper specimen collection/handling, submission of specimen other than nasopharyngeal swab, presence of viral mutation(s) within the areas targeted by this assay, and inadequate number of viral copies(<138 copies/mL). A negative result must be combined with clinical observations, patient history, and epidemiological information. The expected result is Negative.  Fact Sheet for Patients:  EntrepreneurPulse.com.au  Fact Sheet for Healthcare Providers:   IncredibleEmployment.be  This test is no t yet approved or cleared by the [place] FDA and  has been authorized for detection and/or diagnosis of SARS-CoV-2 by FDA under an Emergency Use Authorization (EUA). This EUA will remain  in effect (meaning this test can be used) for the duration of the COVID-19 declaration under Section 564(b)(1) of the Act, 21 U.S.C.section 360bbb-3(b)(1), unless the authorization is terminated  or revoked sooner.       Influenza A by PCR NEGATIVE NEGATIVE Final   Influenza B by PCR NEGATIVE NEGATIVE Final    Comment: (NOTE) The Xpert Xpress SARS-CoV-2/FLU/RSV plus assay is intended as an aid in the diagnosis of influenza from Nasopharyngeal swab specimens and should not be used as a sole basis for treatment. Nasal washings and aspirates are unacceptable for Xpert Xpress SARS-CoV-2/FLU/RSV testing.  Fact Sheet for Patients: EntrepreneurPulse.com.au  Fact Sheet for Healthcare Providers: IncredibleEmployment.be  This test is not yet approved or cleared by the [place] FDA and has been authorized for detection and/or diagnosis of SARS-CoV-2 by FDA under an Emergency Use Authorization (EUA). This EUA will remain in effect (meaning this test can be used) for the duration of the COVID-19 declaration under Section 564(b)(1) of the Act, 21 U.S.C. section 360bbb-3(b)(1), unless the authorization is terminated or revoked.  Performed at [facility], [place] [address], [place], [place] [address]   Culture, blood (routine x 2)     Status: None   Collection Time: 10/14/20  9:25 PM   Specimen: BLOOD  Result Value Ref Range Status   Specimen Description BLOOD RIGHT ANTECUBITAL  Final   Special Requests   Final    BOTTLES DRAWN AEROBIC AND ANAEROBIC Blood Culture adequate volume   Culture   Final    NO GROWTH 5 DAYS Performed at [facility], [place] [address], [place], [place] [address]     Report Status 10/19/2020 FINAL  Final  Culture, blood (routine x 2)     Status: None   Collection Time: 10/14/20  9:25 PM   Specimen: BLOOD RIGHT HAND  Result Value Ref Range Status   Specimen Description BLOOD RIGHT HAND  Final   Special Requests   Final    BOTTLES DRAWN AEROBIC AND ANAEROBIC Blood Culture adequate volume   Culture   Final    NO GROWTH 5 DAYS Performed at [facility], [place] [address] [place], [place] [address]    Report Status 10/19/2020 FINAL  Final  SARS CORONAVIRUS 2 (TAT 6-24 HRS) Nasopharyngeal Nasopharyngeal Swab     Status: None   Collection Time: 10/21/20  2:57 PM   Specimen: Nasopharyngeal Swab  Result Value Ref Range Status   SARS Coronavirus 2 NEGATIVE NEGATIVE Final    Comment: (NOTE) SARS-CoV-2 target nucleic acids are NOT DETECTED.  The SARS-CoV-2 RNA is generally detectable in upper and lower respiratory specimens during the acute phase of infection. Negative results do not preclude SARS-CoV-2 infection, do not rule out co-infections with other pathogens, and should not be used as the sole basis for treatment or other patient management  decisions. Negative results must be combined with clinical observations, patient history, and epidemiological information. The expected result is Negative.  Fact Sheet for Patients: SugarRoll.be  Fact Sheet for Healthcare Providers: https://www.woods-mathews.com/  This test is not yet approved or cleared by the [place] FDA and  has been authorized for detection and/or diagnosis of SARS-CoV-2 by FDA under an Emergency Use Authorization (EUA). This EUA will remain  in effect (meaning this test can be used) for the duration of the COVID-19 declaration under Se ction 564(b)(1) of the Act, 21 U.S.C. section 360bbb-3(b)(1), unless the authorization is terminated or revoked sooner.  Performed at [facility], [place] [address], [place],  [address]       Labs: BNP (last 3 results) Recent Labs    10/14/20 0928 10/20/20 0728  BNP 141.1* 814.4*   Basic Metabolic Panel: Recent Labs  Lab 10/16/20 0158 10/17/20 0140 10/18/20 0106 10/19/20 0119 10/20/20 0728 10/21/20 0208 10/21/20 1940 10/22/20 0451  NA 122* 121* 124* 125* 124* 124* 124* 125*  K 3.5 3.7 3.9 3.6 3.6 3.7  --  3.8  CL 85* 84* 84* 84* 85* 86*  --  85*  CO2 30 31 33* 33* 30 27  --  30  GLUCOSE 115* 110* 116* 99 109* 121*  --  102*  BUN 15 14 17 20 22  31*  --  32*  CREATININE 1.01 0.83 0.95 0.97 1.08 1.18  --  1.31*  CALCIUM 8.6* 8.4* 8.7* 8.7* 9.1 8.9  --  9.0  MG 1.7 1.9 2.0 2.0  --   --   --   --   PHOS  --   --   --   --   --  4.1  --  3.8   Liver Function Tests: Recent Labs  Lab 10/20/20 0728 10/21/20 0208 10/22/20 0451  AST 27 25  --   ALT 22 20  --   ALKPHOS 62 55  --   BILITOT 1.5* 1.3*  --   PROT 8.2* 7.8  --   ALBUMIN 3.1* 2.8*  2.7* 2.8*   No results for input(s): LIPASE, AMYLASE in the last 168 hours. No results for input(s): AMMONIA in the last 168 hours. CBC: Recent Labs  Lab 10/18/20 0106 10/19/20 0119 10/20/20 0728  WBC 8.7 7.6 8.4  NEUTROABS  --   --  6.4  HGB 14.3 14.1 15.1  HCT 42.9 43.2 45.4  MCV 86.3 87.4 86.3  PLT PLATELET CLUMPS NOTED ON SMEAR, UNABLE TO ESTIMATE PLATELET CLUMPS NOTED ON SMEAR, UNABLE TO ESTIMATE PLATELET CLUMPS NOTED ON SMEAR, UNABLE TO ESTIMATE   Cardiac Enzymes: No results for input(s): CKTOTAL, CKMB, CKMBINDEX, TROPONINI in the last 168 hours. BNP: Invalid input(s): POCBNP CBG: No results for input(s): GLUCAP in the last 168 hours. D-Dimer No results for input(s): DDIMER in the last 72 hours. Hgb A1c No results for input(s): HGBA1C in the last 72 hours. Lipid Profile No results for input(s): CHOL, HDL, LDLCALC, TRIG, CHOLHDL, LDLDIRECT in the last 72 hours. Thyroid function studies No results for input(s): TSH, T4TOTAL, T3FREE, THYROIDAB in the last 72 hours.  Invalid input(s):  FREET3 Anemia work up No results for input(s): VITAMINB12, FOLATE, FERRITIN, TIBC, IRON, RETICCTPCT in the last 72 hours. Urinalysis    Component Value Date/Time   COLORURINE YELLOW 10/13/2020 1950   APPEARANCEUR CLEAR 10/13/2020 1950   LABSPEC 1.016 10/13/2020 1950   PHURINE 6.0 10/13/2020 1950   GLUCOSEU NEGATIVE 10/13/2020 1950   HGBUR SMALL (A)  10/13/2020 [place] 10/13/2020 [place] NEGATIVE 10/13/2020 1950   PROTEINUR 100 (A) 10/13/2020 1950   NITRITE NEGATIVE 10/13/2020 1950   LEUKOCYTESUR NEGATIVE 10/13/2020 1950   Sepsis Labs Invalid input(s): PROCALCITONIN,  WBC,  LACTICIDVEN Microbiology Recent Results (from the past 240 hour(s))  Resp Panel by RT-PCR (Flu A&B, Covid) Nasopharyngeal Swab     Status: None   Collection Time: 10/14/20 12:42 PM   Specimen: Nasopharyngeal Swab; Nasopharyngeal(NP) swabs in vial transport medium  Result Value Ref Range Status   SARS Coronavirus 2 by RT PCR NEGATIVE NEGATIVE Final    Comment: (NOTE) SARS-CoV-2 target nucleic acids are NOT DETECTED.  The SARS-CoV-2 RNA is generally detectable in upper respiratory specimens during the acute phase of infection. The lowest concentration of SARS-CoV-2 viral copies this assay can detect is 138 copies/mL. A negative result does not preclude SARS-Cov-2 infection and should not be used as the sole basis for treatment or other patient management decisions. A negative result may occur with  improper specimen collection/handling, submission of specimen other than nasopharyngeal swab, presence of viral mutation(s) within the areas targeted by this assay, and inadequate number of viral copies(<138 copies/mL). A negative result must be combined with clinical observations, patient history, and epidemiological information. The expected result is Negative.  Fact Sheet for Patients:  EntrepreneurPulse.com.au  Fact Sheet for Healthcare Providers:   IncredibleEmployment.be  This test is no t yet approved or cleared by the [place] FDA and  has been authorized for detection and/or diagnosis of SARS-CoV-2 by FDA under an Emergency Use Authorization (EUA). This EUA will remain  in effect (meaning this test can be used) for the duration of the COVID-19 declaration under Section 564(b)(1) of the Act, 21 U.S.C.section 360bbb-3(b)(1), unless the authorization is terminated  or revoked sooner.       Influenza A by PCR NEGATIVE NEGATIVE Final   Influenza B by PCR NEGATIVE NEGATIVE Final    Comment: (NOTE) The Xpert Xpress SARS-CoV-2/FLU/RSV plus assay is intended as an aid in the diagnosis of influenza from Nasopharyngeal swab specimens and should not be used as a sole basis for treatment. Nasal washings and aspirates are unacceptable for Xpert Xpress SARS-CoV-2/FLU/RSV testing.  Fact Sheet for Patients: EntrepreneurPulse.com.au  Fact Sheet for Healthcare Providers: IncredibleEmployment.be  This test is not yet approved or cleared by the [place] FDA and has been authorized for detection and/or diagnosis of SARS-CoV-2 by FDA under an Emergency Use Authorization (EUA). This EUA will remain in effect (meaning this test can be used) for the duration of the COVID-19 declaration under Section 564(b)(1) of the Act, 21 U.S.C. section 360bbb-3(b)(1), unless the authorization is terminated or revoked.  Performed at [facility], [place] [address] [place], [place] [address]   Culture, blood (routine x 2)     Status: None   Collection Time: 10/14/20  9:25 PM   Specimen: BLOOD  Result Value Ref Range Status   Specimen Description BLOOD RIGHT ANTECUBITAL  Final   Special Requests   Final    BOTTLES DRAWN AEROBIC AND ANAEROBIC Blood Culture adequate volume   Culture   Final    NO GROWTH 5 DAYS Performed at [facility], [place] [address] [place], [place] [address]     Report Status 10/19/2020 FINAL  Final  Culture, blood (routine x 2)     Status: None   Collection Time: 10/14/20  9:25 PM   Specimen: BLOOD RIGHT HAND  Result Value  Ref Range Status   Specimen Description BLOOD RIGHT HAND  Final   Special Requests   Final    BOTTLES DRAWN AEROBIC AND ANAEROBIC Blood Culture adequate volume   Culture   Final    NO GROWTH 5 DAYS Performed at [facility], [address] [place], [place] [address]    Report Status 10/19/2020 FINAL  Final  SARS CORONAVIRUS 2 (TAT 6-24 HRS) Nasopharyngeal Nasopharyngeal Swab     Status: None   Collection Time: 10/21/20  2:57 PM   Specimen: Nasopharyngeal Swab  Result Value Ref Range Status   SARS Coronavirus 2 NEGATIVE NEGATIVE Final    Comment: (NOTE) SARS-CoV-2 target nucleic acids are NOT DETECTED.  The SARS-CoV-2 RNA is generally detectable in upper and lower respiratory specimens during the acute phase of infection. Negative results do not preclude SARS-CoV-2 infection, do not rule out co-infections with other pathogens, and should not be used as the sole basis for treatment or other patient management decisions. Negative results must be combined with clinical observations, patient history, and epidemiological information. The expected result is Negative.  Fact Sheet for Patients: SugarRoll.be  Fact Sheet for Healthcare Providers: https://www.woods-mathews.com/  This test is not yet approved or cleared by the [place] FDA and  has been authorized for detection and/or diagnosis of SARS-CoV-2 by FDA under an Emergency Use Authorization (EUA). This EUA will remain  in effect (meaning this test can be used) for the duration of the COVID-19 declaration under Se ction 564(b)(1) of the Act, 21 U.S.C. section 360bbb-3(b)(1), unless the authorization is terminated or revoked sooner.  Performed at [facility], [place] [address] [place], [place] [address]       Time coordinating discharge: Over 30 minutes  SIGNED:   Little Ishikawa, DO [facility] 10/22/2020, 2:43 PM Pager   If 7PM-7AM, please contact night-coverage [email or web address]

## 2020-10-22 NOTE — Progress Notes (Signed)
Patient ID: [name], male   DOB: [birth year], 87 y.o.   MRN: [id number] [name] Progress Note   Assessment/ Plan:   1.  Hyponatremia: Hypervolemic with hypoosmolality and inappropriately elevated urine osmolality.  Likely associated with acute exacerbation of diastolic heart failure and inappropriate ADH activation from decreased effective arterial blood volume. Minimally improved overnight with tolvaptan added to furosemide-repeat dose today. 2.  Acute exacerbation of diastolic heart failure: Clinically improving status post diuresis with weight down 7.7 kg since admission and net -9.7L fluid balance since admission.  Overnight weight loss 2 kg. 3.  Hypertension: Blood pressures currently on goal with metoprolol and diuresis.  ACE inhibitor on hold. 4.  Metabolic alkalosis: Suspected to have underlying chronic respiratory acidosis with exacerbation from ongoing diuresis/contraction.  Transiently improved with acetazolamide  Subjective:   Without acute events overnight, worked with PT again yesterday on transfers from bed to recliner.  Son concerned about overnight blood pressures (the lowest of 94/62).   Objective:   BP 111/72 (BP Location: Left Arm)   Pulse 97   Temp (!) 97.3 F (36.3 C) (Oral)   Resp 18   Ht 5\' 10"  (1.778 m)   Wt 90.2 kg   SpO2 91%   BMI 28.53 kg/m   Intake/Output Summary (Last 24 hours) at 10/22/2020 0851 Last data filed at 10/21/2020 2100 Gross per 24 hour  Intake 580 ml  Output --  Net 580 ml   Weight change: -2 kg  Physical Exam: Gen: Comfortably resting in bed, son at bedside. CVS: Pulse regular rhythm, normal rate, S1 and S2 normal Resp: Decreased breath sounds over bases, no distinct rales or rhonchi Abd: Soft, flat, nontender, bowel sounds normal Ext: Right leg in Ace wrap, trace left lower extremity edema with skin wrinkling  Imaging: No results found.  Labs: BMET Recent Labs  Lab 10/16/20 0158 10/17/20 0140  10/18/20 0106 10/19/20 0119 10/20/20 0728 10/21/20 0208 10/21/20 1940 10/22/20 0451  NA 122* 121* 124* 125* 124* 124* 124* 125*  K 3.5 3.7 3.9 3.6 3.6 3.7  --  3.8  CL 85* 84* 84* 84* 85* 86*  --  85*  CO2 30 31 33* 33* 30 27  --  30  GLUCOSE 115* 110* 116* 99 109* 121*  --  102*  BUN 15 14 17 20 22  31*  --  32*  CREATININE 1.01 0.83 0.95 0.97 1.08 1.18  --  1.31*  CALCIUM 8.6* 8.4* 8.7* 8.7* 9.1 8.9  --  9.0  PHOS  --   --   --   --   --  4.1  --  3.8   CBC Recent Labs  Lab 10/18/20 0106 10/19/20 0119 10/20/20 0728  WBC 8.7 7.6 8.4  NEUTROABS  --   --  6.4  HGB 14.3 14.1 15.1  HCT 42.9 43.2 45.4  MCV 86.3 87.4 86.3  PLT PLATELET CLUMPS NOTED ON SMEAR, UNABLE TO ESTIMATE PLATELET CLUMPS NOTED ON SMEAR, UNABLE TO ESTIMATE PLATELET CLUMPS NOTED ON SMEAR, UNABLE TO ESTIMATE   Medications:     furosemide  40 mg Oral Daily   metoprolol succinate  100 mg Oral Daily   sodium chloride flush  3 mL Intravenous Q12H   tolvaptan  15 mg Oral Once   warfarin  5 mg Oral ONCE-1600   Warfarin - Pharmacist Dosing Inpatient   Does not apply q1600   Elmarie Shiley, MD 10/22/2020, 8:51 AM

## 2020-10-22 NOTE — TOC Transition Note (Signed)
Transition of Care [facility]) - CM/SW Discharge Note   Patient Details  Name: [name] MRN: [id number] Date of Birth: 20-Jun-1930  [facility] of [facility]) CM/SW Contact:  Vinie Sill, LCSW Phone Number: 10/22/2020, 2:53 PM   Clinical Narrative:     Patient will Discharge to: [facility] Discharge Date: 10/22/2020 Family Notified: left voice message for son- unable to reach spouse Transport By: Corey Harold   Please send signed script for tramadol with patient.  Per MD patient is ready for discharge. RN, patient, and facility notified of discharge. Discharge Summary sent to facility. RN given number for report [phone]. Ambulance transport requested for patient.   Clinical Social Worker signing off.  Thurmond Butts, MSW, LCSW Clinical Social Worker      Final next level of care: Skilled Nursing Facility Barriers to Discharge: Barriers Resolved   Patient Goals and CMS Choice        Discharge Placement              Patient chooses bed at: [facility] Patient to be transferred to facility by: [place] Name of family member notified: left voice message with son,Ricky - called spouse, unable to reach her Patient and family notified of of transfer: 10/22/20  Discharge Plan and Services In-house Referral: Clinical Social Work                                   Social Determinants of Health ([place]) Interventions     Readmission Risk Interventions No flowsheet data found.

## 2020-10-22 NOTE — Plan of Care (Signed)

## 2020-10-23 DIAGNOSIS — E871 Hypo-osmolality and hyponatremia: Secondary | ICD-10-CM | POA: Diagnosis not present

## 2020-10-23 DIAGNOSIS — R296 Repeated falls: Secondary | ICD-10-CM | POA: Diagnosis not present

## 2020-10-23 DIAGNOSIS — G9341 Metabolic encephalopathy: Secondary | ICD-10-CM | POA: Diagnosis not present

## 2020-10-23 DIAGNOSIS — I5031 Acute diastolic (congestive) heart failure: Secondary | ICD-10-CM | POA: Diagnosis not present

## 2020-10-23 DIAGNOSIS — I10 Essential (primary) hypertension: Secondary | ICD-10-CM | POA: Diagnosis not present

## 2020-10-23 DIAGNOSIS — I482 Chronic atrial fibrillation, unspecified: Secondary | ICD-10-CM | POA: Diagnosis not present

## 2020-10-23 DIAGNOSIS — J9601 Acute respiratory failure with hypoxia: Secondary | ICD-10-CM | POA: Diagnosis not present

## 2020-10-23 DIAGNOSIS — I83009 Varicose veins of unspecified lower extremity with ulcer of unspecified site: Secondary | ICD-10-CM | POA: Diagnosis not present

## 2020-10-25 DIAGNOSIS — I482 Chronic atrial fibrillation, unspecified: Secondary | ICD-10-CM | POA: Diagnosis not present

## 2020-10-27 DIAGNOSIS — I482 Chronic atrial fibrillation, unspecified: Secondary | ICD-10-CM | POA: Diagnosis not present

## 2020-10-28 ENCOUNTER — Ambulatory Visit: Payer: Medicare Other | Admitting: Physician Assistant

## 2020-10-29 DIAGNOSIS — I10 Essential (primary) hypertension: Secondary | ICD-10-CM | POA: Diagnosis not present

## 2020-10-29 DIAGNOSIS — G9341 Metabolic encephalopathy: Secondary | ICD-10-CM | POA: Diagnosis not present

## 2020-10-29 DIAGNOSIS — E871 Hypo-osmolality and hyponatremia: Secondary | ICD-10-CM | POA: Diagnosis not present

## 2020-10-29 DIAGNOSIS — R296 Repeated falls: Secondary | ICD-10-CM | POA: Diagnosis not present

## 2020-10-29 DIAGNOSIS — I5031 Acute diastolic (congestive) heart failure: Secondary | ICD-10-CM | POA: Diagnosis not present

## 2020-10-29 DIAGNOSIS — I482 Chronic atrial fibrillation, unspecified: Secondary | ICD-10-CM | POA: Diagnosis not present

## 2020-10-29 DIAGNOSIS — I83009 Varicose veins of unspecified lower extremity with ulcer of unspecified site: Secondary | ICD-10-CM | POA: Diagnosis not present

## 2020-10-29 DIAGNOSIS — R7989 Other specified abnormal findings of blood chemistry: Secondary | ICD-10-CM | POA: Diagnosis not present

## 2020-11-02 DIAGNOSIS — E871 Hypo-osmolality and hyponatremia: Secondary | ICD-10-CM | POA: Diagnosis not present

## 2020-11-02 DIAGNOSIS — I482 Chronic atrial fibrillation, unspecified: Secondary | ICD-10-CM | POA: Diagnosis not present

## 2020-11-02 DIAGNOSIS — I5031 Acute diastolic (congestive) heart failure: Secondary | ICD-10-CM | POA: Diagnosis not present

## 2020-11-02 DIAGNOSIS — I83009 Varicose veins of unspecified lower extremity with ulcer of unspecified site: Secondary | ICD-10-CM | POA: Diagnosis not present

## 2020-11-02 DIAGNOSIS — J9601 Acute respiratory failure with hypoxia: Secondary | ICD-10-CM | POA: Diagnosis not present

## 2020-11-02 DIAGNOSIS — G9341 Metabolic encephalopathy: Secondary | ICD-10-CM | POA: Diagnosis not present

## 2020-11-02 DIAGNOSIS — R296 Repeated falls: Secondary | ICD-10-CM | POA: Diagnosis not present

## 2020-11-06 DIAGNOSIS — R7989 Other specified abnormal findings of blood chemistry: Secondary | ICD-10-CM | POA: Diagnosis not present

## 2020-11-06 DIAGNOSIS — I5031 Acute diastolic (congestive) heart failure: Secondary | ICD-10-CM | POA: Diagnosis not present

## 2020-11-06 DIAGNOSIS — I482 Chronic atrial fibrillation, unspecified: Secondary | ICD-10-CM | POA: Diagnosis not present

## 2020-11-06 DIAGNOSIS — J9601 Acute respiratory failure with hypoxia: Secondary | ICD-10-CM | POA: Diagnosis not present

## 2020-11-06 DIAGNOSIS — E871 Hypo-osmolality and hyponatremia: Secondary | ICD-10-CM | POA: Diagnosis not present

## 2020-11-06 DIAGNOSIS — I10 Essential (primary) hypertension: Secondary | ICD-10-CM | POA: Diagnosis not present

## 2020-11-06 DIAGNOSIS — R296 Repeated falls: Secondary | ICD-10-CM | POA: Diagnosis not present

## 2020-11-06 DIAGNOSIS — I83009 Varicose veins of unspecified lower extremity with ulcer of unspecified site: Secondary | ICD-10-CM | POA: Diagnosis not present

## 2020-11-09 DIAGNOSIS — R2689 Other abnormalities of gait and mobility: Secondary | ICD-10-CM | POA: Diagnosis not present

## 2020-11-09 DIAGNOSIS — M6281 Muscle weakness (generalized): Secondary | ICD-10-CM | POA: Diagnosis not present

## 2020-11-09 DIAGNOSIS — I482 Chronic atrial fibrillation, unspecified: Secondary | ICD-10-CM | POA: Diagnosis not present

## 2020-11-09 DIAGNOSIS — R278 Other lack of coordination: Secondary | ICD-10-CM | POA: Diagnosis not present

## 2020-11-09 NOTE — Progress Notes (Addendum)
Cardiology Office Note:    Date:  11/10/2020   ID:  [name], DOB 1930-05-10, MRN [id number]  PCP:  Danny Arabian, MD   [facility] Providers Cardiologist:  Sinclair Grooms, MD    Referring MD: Danny Arabian, MD   Chief Complaint:  Hospitalization Follow-up (CHF, AF with RVR)    Patient Profile:   [name] is a 87 y.o. male with:  Permanent atrial fibrillation Chronic Coumadin therapy History of embolic CVA HFpEF Cath 04/5036: No significant CAD Hypertension Hyperlipidemia Venous insufficiency with chronic venous stasis ulcers Memory loss  History of Present Illness: [name] was last seen by [name] in 1/21.   He was admitted 10/4-10/13 with acute hypoxic respiratory failure due to acute HFpEF and atrial fibrillation with rapid rate in the setting of mechanical fall.  He was followed by cardiology.  He did have symptoms of chest pain.  An echocardiogram demonstrated normal EF with normal wall motion and his hs-Troponins were normal.  He was diuresed.  He did have hyponatremia and was followed by nephrology.  His HCTZ was discontinued.  He was discharged to [name] (S[facility]).  He turns for follow-up.  He is here today with his son.  He notes that he will be going home in the next 1 to 2 days.  He did take medications this morning but is not sure which ones.  He has had blood drawn to recheck his sodium.  He has not had chest pain.  His breathing is stable.  He sleeps on 1-2 pillows.  His leg edema is much improved.  He has not had syncope.  ASSESSMENT & PLAN:   Atrial fibrillation (HCC) Heart rate is uncontrolled.  It is not clear if he took his metoprolol yet today.  We tried several times to reach [name] but we could not speak to anyone.  If he has taken metoprolol succinate already today, increase metoprolol succinate to 150 mg daily.  If he has not taken metoprolol succinate, no change.  Continue warfarin for anticoagulation.  Follow-up with [name]  in 3 months.  (HFpEF) heart failure with preserved ejection fraction (HCC) Echocardiogram in 10/22 with EF 60-65.  Overall, volume status appears stable.  Continue furosemide 40 mg daily.  We will try to reach [name] to request a copy of his most recent BMET.  Essential hypertension Blood pressure somewhat elevated.  As noted, if he has already taken metoprolol today, I will increase his dose to 150 mg daily.  Hyponatremia Related to a combination of HCTZ and SIADH.  He has had blood drawn at [name].  As noted, we will request the most recent result.   Addendum: Labs from 11/06/2020 received and reviewed.  Creatinine 0.79, BUN 12.2, NA 134, K+ 4.3         Dispo:  Return in about 3 months (around 02/10/2021) for Routine Follow Up w/ [name].    Prior CV studies: Echocardiogram 10/15/2020  EF 60-65, no RWMA, mild LVH, normal RVSF, mildly elevated PASP, severe LAE, mild MR, RVSP 37.3  Cardiac catheterization 05/04/2006 Minimal plaquing, no evidence of significant obstructive CAD EF 65     Past Medical History:  Diagnosis Date   Atrial fibrillation ([place]) 10/15/2012   Cancer ([place])    Skin (R hand)   Embolic stroke ([place]) 88/28/0034   Essential hypertension 01/21/2014   Long term current use of anticoagulant therapy 12/25/2012   Current Medications: Current Meds  Medication Sig  acetaminophen (TYLENOL) 500 MG tablet Take 500-1,000 mg by mouth every 6 (six) hours as needed (FOR PAIN.).   albuterol (VENTOLIN HFA) 108 (90 Base) MCG/ACT inhaler Inhale 1 puff into the lungs every 4 (four) hours as needed for wheezing or shortness of breath.   aspirin EC 81 MG tablet Take 81 mg by mouth at bedtime.   calcium carbonate (OS-CAL) 600 MG TABS tablet Take 600 mg by mouth daily.    furosemide (LASIX) 40 MG tablet Take 1 tablet (40 mg total) by mouth daily.   Glucosamine HCl (GLUCOSAMINE PO) Take 1,500 mg by mouth daily.    metoprolol succinate (TOPROL-XL) 100 MG 24 hr tablet Take 1  tablet (100 mg total) by mouth daily. Take with or immediately following a meal.   Multiple Vitamin (MULTIVITAMIN) tablet Take 1 tablet by mouth daily.   Multiple Vitamins-Minerals (PRESERVISION AREDS 2) CAPS Take 1 capsule by mouth 2 (two) times daily.   traMADol (ULTRAM) 50 MG tablet Take 1 tablet (50 mg total) by mouth every 6 (six) hours as needed.   warfarin (COUMADIN) 1 MG tablet Take as directed by the coumadin dept   warfarin (COUMADIN) 3 MG tablet Take as directed by coumadin dept   warfarin (COUMADIN) 5 MG tablet TAKE AS DIRECTED PER ANTICOAGULATION CLINIC (Patient taking differently: Take 5-7.5 mg by mouth See admin instructions. 7.5 mg on Sunday 5 mg Monday,Tuesday,Wednesday,Thursday,Friday,saturday)    Allergies:   Patient has no known allergies.   Social History   Tobacco Use   Smoking status: Former    Packs/day: 0.25    Years: 10.00    Pack years: 2.50    Types: Cigarettes   Smokeless tobacco: Never  Vaping Use   Vaping Use: Never used  Substance Use Topics   Alcohol use: No    Alcohol/week: 0.0 standard drinks   Drug use: No    Family Hx: The patient's family history includes Other in his father and mother.  ROS see HPI  EKGs/Labs/Other Test Reviewed:    EKG:  EKG is   ordered today.  The ekg ordered today demonstrates a fibrillation, HR 118, left axis deviation, septal Q waves, and nonspecific ST-T wave changes  Recent Labs: 10/14/2020: TSH 0.991 10/19/2020: Magnesium 2.0 10/20/2020: B Natriuretic Peptide 103.7; Hemoglobin 15.1; Platelets PLATELET CLUMPS NOTED ON SMEAR, UNABLE TO ESTIMATE 10/21/2020: ALT 20 10/22/2020: BUN 32; Creatinine, Ser 1.31; Potassium 3.8; Sodium 125   Recent Lipid Panel Lab Results  Component Value Date/Time   CHOL 137 10/15/2020 01:02 PM   TRIG 87 10/15/2020 01:02 PM   HDL 45 10/15/2020 01:02 PM   LDLCALC 75 10/15/2020 01:02 PM     Risk Assessment/Calculations:    CHA2DS2-VASc Score = 6   This indicates a 9.7% annual  risk of stroke. The patient's score is based upon: CHF History: 1 HTN History: 1 Diabetes History: 0 Stroke History: 2 Vascular Disease History: 0 Age Score: 2 Gender Score: 0         Physical Exam:    VS:  BP 140/64   Pulse 82   Ht 5\' 10"  (1.778 m)   SpO2 92%   BMI 28.53 kg/m     Wt Readings from Last 3 Encounters:  10/22/20 198 lb 13.7 oz (90.2 kg)  08/31/20 200 lb (90.7 kg)  01/24/19 216 lb (98 kg)    Constitutional:      Appearance: Healthy appearance. Not in distress.  Neck:     Vascular: No JVR.  Pulmonary:  Effort: Pulmonary effort is normal.     Breath sounds: No wheezing. No rales.  Cardiovascular:     Tachycardia present. Irregularly irregular rhythm. Normal S1. Normal S2.      Murmurs: There is no murmur.  Edema:    Pretibial: bilateral 1+ edema of the pretibial area.    Comments: +stasis changes Abdominal:     Palpations: Abdomen is soft.  Skin:    General: Skin is warm and dry.  Neurological:     General: No focal deficit present.     Mental Status: Alert and oriented to person, place and time.     Cranial Nerves: Cranial nerves are intact.       Medication Adjustments/Labs and Tests Ordered: Current medicines are reviewed at length with the patient today.  Concerns regarding medicines are outlined above.  Tests Ordered: Orders Placed This Encounter  Procedures   EKG 12-Lead   Medication Changes: No orders of the defined types were placed in this encounter.  Signed, Richardson Dopp, PA-C  11/10/2020 12:14 PM    [facility] [address], [place], [place]  [address] Phone: [phone]; Fax: [phone]

## 2020-11-10 ENCOUNTER — Other Ambulatory Visit: Payer: Self-pay

## 2020-11-10 ENCOUNTER — Ambulatory Visit: Payer: Medicare Other | Admitting: Physician Assistant

## 2020-11-10 ENCOUNTER — Encounter: Payer: Self-pay | Admitting: Physician Assistant

## 2020-11-10 VITALS — BP 140/64 | HR 82 | Ht 70.0 in

## 2020-11-10 DIAGNOSIS — I5032 Chronic diastolic (congestive) heart failure: Secondary | ICD-10-CM

## 2020-11-10 DIAGNOSIS — I4821 Permanent atrial fibrillation: Secondary | ICD-10-CM | POA: Diagnosis not present

## 2020-11-10 DIAGNOSIS — R2689 Other abnormalities of gait and mobility: Secondary | ICD-10-CM | POA: Diagnosis not present

## 2020-11-10 DIAGNOSIS — R278 Other lack of coordination: Secondary | ICD-10-CM | POA: Diagnosis not present

## 2020-11-10 DIAGNOSIS — I10 Essential (primary) hypertension: Secondary | ICD-10-CM | POA: Diagnosis not present

## 2020-11-10 DIAGNOSIS — M6281 Muscle weakness (generalized): Secondary | ICD-10-CM | POA: Diagnosis not present

## 2020-11-10 DIAGNOSIS — R55 Syncope and collapse: Secondary | ICD-10-CM | POA: Diagnosis not present

## 2020-11-10 DIAGNOSIS — I4891 Unspecified atrial fibrillation: Secondary | ICD-10-CM | POA: Diagnosis not present

## 2020-11-10 DIAGNOSIS — E871 Hypo-osmolality and hyponatremia: Secondary | ICD-10-CM | POA: Diagnosis not present

## 2020-11-10 DIAGNOSIS — I482 Chronic atrial fibrillation, unspecified: Secondary | ICD-10-CM | POA: Diagnosis not present

## 2020-11-10 DIAGNOSIS — Z9181 History of falling: Secondary | ICD-10-CM | POA: Diagnosis not present

## 2020-11-10 NOTE — Assessment & Plan Note (Signed)
Echocardiogram in 10/22 with EF 60-65.  Overall, volume status appears stable.  Continue furosemide 40 mg daily.  We will try to reach [facility] to request a copy of his most recent BMET.

## 2020-11-10 NOTE — Assessment & Plan Note (Signed)
Related to a combination of HCTZ and SIADH.  He has had blood drawn at [facility].  As noted, we will request the most recent result.

## 2020-11-10 NOTE — Assessment & Plan Note (Signed)
Heart rate is uncontrolled.  It is not clear if he took his metoprolol yet today.  We tried several times to reach [facility] but we could not speak to anyone.  If he has taken metoprolol succinate already today, increase metoprolol succinate to 150 mg daily.  If he has not taken metoprolol succinate, no change.  Continue warfarin for anticoagulation.  Follow-up with Dr. Tamala Julian in 3 months.

## 2020-11-10 NOTE — Assessment & Plan Note (Signed)
Blood pressure somewhat elevated.  As noted, if he has already taken metoprolol today, I will increase his dose to 150 mg daily.

## 2020-11-10 NOTE — Patient Instructions (Signed)
Medication Instructions:   IF PATIENT HAS TAKEN TOPROL THIS AM INCREASE TO one and one half tablet ( 150 mg) dail.  IF PATIENT HAS NOT TAKEN TOPROL pt will stay on one tablet by mouth ( 100 mg) daily.   *If you need a refill on your cardiac medications before your next appointment, please call your pharmacy*   Lab Work:   PLEASE SEND (FAX) RECENT BMET TO Attention: Tajh Livsey [phone].  If you have labs (blood work) drawn today and your tests are completely normal, you will receive your results only by: [place] (if you have MyChart) OR A paper copy in the mail If you have any lab test that is abnormal or we need to change your treatment, we will call you to review the results.   Testing/Procedures:  -NONE   Follow-Up: At [facility], you and your health needs are our priority.  As part of our continuing mission to provide you with exceptional heart care, we have created designated Provider Care Teams.  These Care Teams include your primary Cardiologist (physician) and Advanced Practice Providers (APPs -  Physician Assistants and Nurse Practitioners) who all work together to provide you with the care you need, when you need it.  We recommend signing up for the patient portal called "MyChart".  Sign up information is provided on this After Visit Summary.  MyChart is used to connect with patients for Virtual Visits (Telemedicine).  Patients are able to view lab/test results, encounter notes, upcoming appointments, etc.  Non-urgent messages can be sent to your provider as well.   To learn more about what you can do with MyChart, go to [email or web address].    Your next appointment:   3 month(s)  The format for your next appointment:   In Person  Provider:   Daneen Schick, MD   Other Instructions

## 2020-11-11 DIAGNOSIS — G9341 Metabolic encephalopathy: Secondary | ICD-10-CM | POA: Diagnosis not present

## 2020-11-11 DIAGNOSIS — J9601 Acute respiratory failure with hypoxia: Secondary | ICD-10-CM | POA: Diagnosis not present

## 2020-11-11 DIAGNOSIS — E871 Hypo-osmolality and hyponatremia: Secondary | ICD-10-CM | POA: Diagnosis not present

## 2020-11-11 DIAGNOSIS — R296 Repeated falls: Secondary | ICD-10-CM | POA: Diagnosis not present

## 2020-11-11 DIAGNOSIS — I5031 Acute diastolic (congestive) heart failure: Secondary | ICD-10-CM | POA: Diagnosis not present

## 2020-11-11 DIAGNOSIS — I83009 Varicose veins of unspecified lower extremity with ulcer of unspecified site: Secondary | ICD-10-CM | POA: Diagnosis not present

## 2020-11-11 DIAGNOSIS — I482 Chronic atrial fibrillation, unspecified: Secondary | ICD-10-CM | POA: Diagnosis not present

## 2020-11-11 DIAGNOSIS — I10 Essential (primary) hypertension: Secondary | ICD-10-CM | POA: Diagnosis not present

## 2020-11-16 DIAGNOSIS — J9601 Acute respiratory failure with hypoxia: Secondary | ICD-10-CM | POA: Diagnosis not present

## 2020-11-16 DIAGNOSIS — I5030 Unspecified diastolic (congestive) heart failure: Secondary | ICD-10-CM | POA: Diagnosis not present

## 2020-11-16 DIAGNOSIS — I10 Essential (primary) hypertension: Secondary | ICD-10-CM | POA: Diagnosis not present

## 2020-11-16 DIAGNOSIS — S68012A Complete traumatic metacarpophalangeal amputation of left thumb, initial encounter: Secondary | ICD-10-CM | POA: Diagnosis not present

## 2020-11-16 DIAGNOSIS — I8310 Varicose veins of unspecified lower extremity with inflammation: Secondary | ICD-10-CM | POA: Diagnosis not present

## 2020-11-16 DIAGNOSIS — E871 Hypo-osmolality and hyponatremia: Secondary | ICD-10-CM | POA: Diagnosis not present

## 2020-11-16 DIAGNOSIS — D6869 Other thrombophilia: Secondary | ICD-10-CM | POA: Diagnosis not present

## 2020-11-16 DIAGNOSIS — D692 Other nonthrombocytopenic purpura: Secondary | ICD-10-CM | POA: Diagnosis not present

## 2020-11-18 DIAGNOSIS — L570 Actinic keratosis: Secondary | ICD-10-CM | POA: Diagnosis not present

## 2020-11-18 DIAGNOSIS — Z87891 Personal history of nicotine dependence: Secondary | ICD-10-CM | POA: Diagnosis not present

## 2020-11-18 DIAGNOSIS — Z9181 History of falling: Secondary | ICD-10-CM | POA: Diagnosis not present

## 2020-11-18 DIAGNOSIS — Z7982 Long term (current) use of aspirin: Secondary | ICD-10-CM | POA: Diagnosis not present

## 2020-11-18 DIAGNOSIS — Z8673 Personal history of transient ischemic attack (TIA), and cerebral infarction without residual deficits: Secondary | ICD-10-CM | POA: Diagnosis not present

## 2020-11-18 DIAGNOSIS — M5136 Other intervertebral disc degeneration, lumbar region: Secondary | ICD-10-CM | POA: Diagnosis not present

## 2020-11-18 DIAGNOSIS — I480 Paroxysmal atrial fibrillation: Secondary | ICD-10-CM | POA: Diagnosis not present

## 2020-11-18 DIAGNOSIS — M48061 Spinal stenosis, lumbar region without neurogenic claudication: Secondary | ICD-10-CM | POA: Diagnosis not present

## 2020-11-18 DIAGNOSIS — I5033 Acute on chronic diastolic (congestive) heart failure: Secondary | ICD-10-CM | POA: Diagnosis not present

## 2020-11-18 DIAGNOSIS — M47812 Spondylosis without myelopathy or radiculopathy, cervical region: Secondary | ICD-10-CM | POA: Diagnosis not present

## 2020-11-18 DIAGNOSIS — Z96641 Presence of right artificial hip joint: Secondary | ICD-10-CM | POA: Diagnosis not present

## 2020-11-18 DIAGNOSIS — I110 Hypertensive heart disease with heart failure: Secondary | ICD-10-CM | POA: Diagnosis not present

## 2020-11-18 DIAGNOSIS — S68012D Complete traumatic metacarpophalangeal amputation of left thumb, subsequent encounter: Secondary | ICD-10-CM | POA: Diagnosis not present

## 2020-11-18 DIAGNOSIS — J9601 Acute respiratory failure with hypoxia: Secondary | ICD-10-CM | POA: Diagnosis not present

## 2020-11-18 DIAGNOSIS — K5730 Diverticulosis of large intestine without perforation or abscess without bleeding: Secondary | ICD-10-CM | POA: Diagnosis not present

## 2020-11-18 DIAGNOSIS — E871 Hypo-osmolality and hyponatremia: Secondary | ICD-10-CM | POA: Diagnosis not present

## 2020-11-18 DIAGNOSIS — D6869 Other thrombophilia: Secondary | ICD-10-CM | POA: Diagnosis not present

## 2020-11-18 DIAGNOSIS — Z7901 Long term (current) use of anticoagulants: Secondary | ICD-10-CM | POA: Diagnosis not present

## 2020-11-18 DIAGNOSIS — E781 Pure hyperglyceridemia: Secondary | ICD-10-CM | POA: Diagnosis not present

## 2020-11-18 DIAGNOSIS — D696 Thrombocytopenia, unspecified: Secondary | ICD-10-CM | POA: Diagnosis not present

## 2020-11-18 DIAGNOSIS — I8310 Varicose veins of unspecified lower extremity with inflammation: Secondary | ICD-10-CM | POA: Diagnosis not present

## 2020-11-24 DIAGNOSIS — I480 Paroxysmal atrial fibrillation: Secondary | ICD-10-CM | POA: Diagnosis not present

## 2020-11-24 DIAGNOSIS — Z96641 Presence of right artificial hip joint: Secondary | ICD-10-CM | POA: Diagnosis not present

## 2020-11-24 DIAGNOSIS — D6869 Other thrombophilia: Secondary | ICD-10-CM | POA: Diagnosis not present

## 2020-11-24 DIAGNOSIS — M5136 Other intervertebral disc degeneration, lumbar region: Secondary | ICD-10-CM | POA: Diagnosis not present

## 2020-11-24 DIAGNOSIS — Z87891 Personal history of nicotine dependence: Secondary | ICD-10-CM | POA: Diagnosis not present

## 2020-11-24 DIAGNOSIS — D696 Thrombocytopenia, unspecified: Secondary | ICD-10-CM | POA: Diagnosis not present

## 2020-11-24 DIAGNOSIS — J9601 Acute respiratory failure with hypoxia: Secondary | ICD-10-CM | POA: Diagnosis not present

## 2020-11-24 DIAGNOSIS — E871 Hypo-osmolality and hyponatremia: Secondary | ICD-10-CM | POA: Diagnosis not present

## 2020-11-24 DIAGNOSIS — M47812 Spondylosis without myelopathy or radiculopathy, cervical region: Secondary | ICD-10-CM | POA: Diagnosis not present

## 2020-11-24 DIAGNOSIS — L570 Actinic keratosis: Secondary | ICD-10-CM | POA: Diagnosis not present

## 2020-11-24 DIAGNOSIS — Z8673 Personal history of transient ischemic attack (TIA), and cerebral infarction without residual deficits: Secondary | ICD-10-CM | POA: Diagnosis not present

## 2020-11-24 DIAGNOSIS — I5033 Acute on chronic diastolic (congestive) heart failure: Secondary | ICD-10-CM | POA: Diagnosis not present

## 2020-11-24 DIAGNOSIS — S68012D Complete traumatic metacarpophalangeal amputation of left thumb, subsequent encounter: Secondary | ICD-10-CM | POA: Diagnosis not present

## 2020-11-24 DIAGNOSIS — E781 Pure hyperglyceridemia: Secondary | ICD-10-CM | POA: Diagnosis not present

## 2020-11-24 DIAGNOSIS — M48061 Spinal stenosis, lumbar region without neurogenic claudication: Secondary | ICD-10-CM | POA: Diagnosis not present

## 2020-11-24 DIAGNOSIS — I110 Hypertensive heart disease with heart failure: Secondary | ICD-10-CM | POA: Diagnosis not present

## 2020-11-24 DIAGNOSIS — Z9181 History of falling: Secondary | ICD-10-CM | POA: Diagnosis not present

## 2020-11-24 DIAGNOSIS — K5730 Diverticulosis of large intestine without perforation or abscess without bleeding: Secondary | ICD-10-CM | POA: Diagnosis not present

## 2020-11-24 DIAGNOSIS — Z7901 Long term (current) use of anticoagulants: Secondary | ICD-10-CM | POA: Diagnosis not present

## 2020-11-24 DIAGNOSIS — I8310 Varicose veins of unspecified lower extremity with inflammation: Secondary | ICD-10-CM | POA: Diagnosis not present

## 2020-11-24 DIAGNOSIS — Z7982 Long term (current) use of aspirin: Secondary | ICD-10-CM | POA: Diagnosis not present

## 2020-11-27 DIAGNOSIS — S68012D Complete traumatic metacarpophalangeal amputation of left thumb, subsequent encounter: Secondary | ICD-10-CM | POA: Diagnosis not present

## 2020-11-27 DIAGNOSIS — I480 Paroxysmal atrial fibrillation: Secondary | ICD-10-CM | POA: Diagnosis not present

## 2020-11-27 DIAGNOSIS — Z9181 History of falling: Secondary | ICD-10-CM | POA: Diagnosis not present

## 2020-11-27 DIAGNOSIS — M47812 Spondylosis without myelopathy or radiculopathy, cervical region: Secondary | ICD-10-CM | POA: Diagnosis not present

## 2020-11-27 DIAGNOSIS — E781 Pure hyperglyceridemia: Secondary | ICD-10-CM | POA: Diagnosis not present

## 2020-11-27 DIAGNOSIS — I5033 Acute on chronic diastolic (congestive) heart failure: Secondary | ICD-10-CM | POA: Diagnosis not present

## 2020-11-27 DIAGNOSIS — M5136 Other intervertebral disc degeneration, lumbar region: Secondary | ICD-10-CM | POA: Diagnosis not present

## 2020-11-27 DIAGNOSIS — Z7982 Long term (current) use of aspirin: Secondary | ICD-10-CM | POA: Diagnosis not present

## 2020-11-27 DIAGNOSIS — D696 Thrombocytopenia, unspecified: Secondary | ICD-10-CM | POA: Diagnosis not present

## 2020-11-27 DIAGNOSIS — M48061 Spinal stenosis, lumbar region without neurogenic claudication: Secondary | ICD-10-CM | POA: Diagnosis not present

## 2020-11-27 DIAGNOSIS — Z96641 Presence of right artificial hip joint: Secondary | ICD-10-CM | POA: Diagnosis not present

## 2020-11-27 DIAGNOSIS — I110 Hypertensive heart disease with heart failure: Secondary | ICD-10-CM | POA: Diagnosis not present

## 2020-11-27 DIAGNOSIS — J9601 Acute respiratory failure with hypoxia: Secondary | ICD-10-CM | POA: Diagnosis not present

## 2020-11-27 DIAGNOSIS — D6869 Other thrombophilia: Secondary | ICD-10-CM | POA: Diagnosis not present

## 2020-11-27 DIAGNOSIS — Z7901 Long term (current) use of anticoagulants: Secondary | ICD-10-CM | POA: Diagnosis not present

## 2020-11-27 DIAGNOSIS — K5730 Diverticulosis of large intestine without perforation or abscess without bleeding: Secondary | ICD-10-CM | POA: Diagnosis not present

## 2020-11-27 DIAGNOSIS — Z87891 Personal history of nicotine dependence: Secondary | ICD-10-CM | POA: Diagnosis not present

## 2020-11-27 DIAGNOSIS — I8310 Varicose veins of unspecified lower extremity with inflammation: Secondary | ICD-10-CM | POA: Diagnosis not present

## 2020-11-27 DIAGNOSIS — Z8673 Personal history of transient ischemic attack (TIA), and cerebral infarction without residual deficits: Secondary | ICD-10-CM | POA: Diagnosis not present

## 2020-11-27 DIAGNOSIS — L570 Actinic keratosis: Secondary | ICD-10-CM | POA: Diagnosis not present

## 2020-11-27 DIAGNOSIS — E871 Hypo-osmolality and hyponatremia: Secondary | ICD-10-CM | POA: Diagnosis not present

## 2020-12-01 DIAGNOSIS — Z9181 History of falling: Secondary | ICD-10-CM | POA: Diagnosis not present

## 2020-12-01 DIAGNOSIS — L570 Actinic keratosis: Secondary | ICD-10-CM | POA: Diagnosis not present

## 2020-12-01 DIAGNOSIS — Z8673 Personal history of transient ischemic attack (TIA), and cerebral infarction without residual deficits: Secondary | ICD-10-CM | POA: Diagnosis not present

## 2020-12-01 DIAGNOSIS — M5136 Other intervertebral disc degeneration, lumbar region: Secondary | ICD-10-CM | POA: Diagnosis not present

## 2020-12-01 DIAGNOSIS — Z7982 Long term (current) use of aspirin: Secondary | ICD-10-CM | POA: Diagnosis not present

## 2020-12-01 DIAGNOSIS — Z87891 Personal history of nicotine dependence: Secondary | ICD-10-CM | POA: Diagnosis not present

## 2020-12-01 DIAGNOSIS — I480 Paroxysmal atrial fibrillation: Secondary | ICD-10-CM | POA: Diagnosis not present

## 2020-12-01 DIAGNOSIS — E781 Pure hyperglyceridemia: Secondary | ICD-10-CM | POA: Diagnosis not present

## 2020-12-01 DIAGNOSIS — E871 Hypo-osmolality and hyponatremia: Secondary | ICD-10-CM | POA: Diagnosis not present

## 2020-12-01 DIAGNOSIS — I8310 Varicose veins of unspecified lower extremity with inflammation: Secondary | ICD-10-CM | POA: Diagnosis not present

## 2020-12-01 DIAGNOSIS — S68012D Complete traumatic metacarpophalangeal amputation of left thumb, subsequent encounter: Secondary | ICD-10-CM | POA: Diagnosis not present

## 2020-12-01 DIAGNOSIS — D6869 Other thrombophilia: Secondary | ICD-10-CM | POA: Diagnosis not present

## 2020-12-01 DIAGNOSIS — M47812 Spondylosis without myelopathy or radiculopathy, cervical region: Secondary | ICD-10-CM | POA: Diagnosis not present

## 2020-12-01 DIAGNOSIS — I110 Hypertensive heart disease with heart failure: Secondary | ICD-10-CM | POA: Diagnosis not present

## 2020-12-01 DIAGNOSIS — J9601 Acute respiratory failure with hypoxia: Secondary | ICD-10-CM | POA: Diagnosis not present

## 2020-12-01 DIAGNOSIS — Z96641 Presence of right artificial hip joint: Secondary | ICD-10-CM | POA: Diagnosis not present

## 2020-12-01 DIAGNOSIS — I5033 Acute on chronic diastolic (congestive) heart failure: Secondary | ICD-10-CM | POA: Diagnosis not present

## 2020-12-01 DIAGNOSIS — K5730 Diverticulosis of large intestine without perforation or abscess without bleeding: Secondary | ICD-10-CM | POA: Diagnosis not present

## 2020-12-01 DIAGNOSIS — Z7901 Long term (current) use of anticoagulants: Secondary | ICD-10-CM | POA: Diagnosis not present

## 2020-12-01 DIAGNOSIS — M48061 Spinal stenosis, lumbar region without neurogenic claudication: Secondary | ICD-10-CM | POA: Diagnosis not present

## 2020-12-01 DIAGNOSIS — D696 Thrombocytopenia, unspecified: Secondary | ICD-10-CM | POA: Diagnosis not present

## 2020-12-04 DIAGNOSIS — I110 Hypertensive heart disease with heart failure: Secondary | ICD-10-CM | POA: Diagnosis not present

## 2020-12-04 DIAGNOSIS — Z7982 Long term (current) use of aspirin: Secondary | ICD-10-CM | POA: Diagnosis not present

## 2020-12-04 DIAGNOSIS — I5033 Acute on chronic diastolic (congestive) heart failure: Secondary | ICD-10-CM | POA: Diagnosis not present

## 2020-12-04 DIAGNOSIS — J9601 Acute respiratory failure with hypoxia: Secondary | ICD-10-CM | POA: Diagnosis not present

## 2020-12-04 DIAGNOSIS — L570 Actinic keratosis: Secondary | ICD-10-CM | POA: Diagnosis not present

## 2020-12-04 DIAGNOSIS — M47812 Spondylosis without myelopathy or radiculopathy, cervical region: Secondary | ICD-10-CM | POA: Diagnosis not present

## 2020-12-04 DIAGNOSIS — Z9181 History of falling: Secondary | ICD-10-CM | POA: Diagnosis not present

## 2020-12-04 DIAGNOSIS — M5136 Other intervertebral disc degeneration, lumbar region: Secondary | ICD-10-CM | POA: Diagnosis not present

## 2020-12-04 DIAGNOSIS — D6869 Other thrombophilia: Secondary | ICD-10-CM | POA: Diagnosis not present

## 2020-12-04 DIAGNOSIS — E871 Hypo-osmolality and hyponatremia: Secondary | ICD-10-CM | POA: Diagnosis not present

## 2020-12-04 DIAGNOSIS — D696 Thrombocytopenia, unspecified: Secondary | ICD-10-CM | POA: Diagnosis not present

## 2020-12-04 DIAGNOSIS — Z96641 Presence of right artificial hip joint: Secondary | ICD-10-CM | POA: Diagnosis not present

## 2020-12-04 DIAGNOSIS — E781 Pure hyperglyceridemia: Secondary | ICD-10-CM | POA: Diagnosis not present

## 2020-12-04 DIAGNOSIS — I480 Paroxysmal atrial fibrillation: Secondary | ICD-10-CM | POA: Diagnosis not present

## 2020-12-04 DIAGNOSIS — I8310 Varicose veins of unspecified lower extremity with inflammation: Secondary | ICD-10-CM | POA: Diagnosis not present

## 2020-12-04 DIAGNOSIS — M48061 Spinal stenosis, lumbar region without neurogenic claudication: Secondary | ICD-10-CM | POA: Diagnosis not present

## 2020-12-04 DIAGNOSIS — K5730 Diverticulosis of large intestine without perforation or abscess without bleeding: Secondary | ICD-10-CM | POA: Diagnosis not present

## 2020-12-04 DIAGNOSIS — S68012D Complete traumatic metacarpophalangeal amputation of left thumb, subsequent encounter: Secondary | ICD-10-CM | POA: Diagnosis not present

## 2020-12-04 DIAGNOSIS — Z87891 Personal history of nicotine dependence: Secondary | ICD-10-CM | POA: Diagnosis not present

## 2020-12-04 DIAGNOSIS — Z7901 Long term (current) use of anticoagulants: Secondary | ICD-10-CM | POA: Diagnosis not present

## 2020-12-04 DIAGNOSIS — Z8673 Personal history of transient ischemic attack (TIA), and cerebral infarction without residual deficits: Secondary | ICD-10-CM | POA: Diagnosis not present

## 2020-12-07 DIAGNOSIS — E871 Hypo-osmolality and hyponatremia: Secondary | ICD-10-CM | POA: Diagnosis not present

## 2020-12-07 DIAGNOSIS — Z87891 Personal history of nicotine dependence: Secondary | ICD-10-CM | POA: Diagnosis not present

## 2020-12-07 DIAGNOSIS — Z96641 Presence of right artificial hip joint: Secondary | ICD-10-CM | POA: Diagnosis not present

## 2020-12-07 DIAGNOSIS — D6869 Other thrombophilia: Secondary | ICD-10-CM | POA: Diagnosis not present

## 2020-12-07 DIAGNOSIS — K5730 Diverticulosis of large intestine without perforation or abscess without bleeding: Secondary | ICD-10-CM | POA: Diagnosis not present

## 2020-12-07 DIAGNOSIS — Z8673 Personal history of transient ischemic attack (TIA), and cerebral infarction without residual deficits: Secondary | ICD-10-CM | POA: Diagnosis not present

## 2020-12-07 DIAGNOSIS — I480 Paroxysmal atrial fibrillation: Secondary | ICD-10-CM | POA: Diagnosis not present

## 2020-12-07 DIAGNOSIS — E781 Pure hyperglyceridemia: Secondary | ICD-10-CM | POA: Diagnosis not present

## 2020-12-07 DIAGNOSIS — M5136 Other intervertebral disc degeneration, lumbar region: Secondary | ICD-10-CM | POA: Diagnosis not present

## 2020-12-07 DIAGNOSIS — M47812 Spondylosis without myelopathy or radiculopathy, cervical region: Secondary | ICD-10-CM | POA: Diagnosis not present

## 2020-12-07 DIAGNOSIS — Z7982 Long term (current) use of aspirin: Secondary | ICD-10-CM | POA: Diagnosis not present

## 2020-12-07 DIAGNOSIS — J9601 Acute respiratory failure with hypoxia: Secondary | ICD-10-CM | POA: Diagnosis not present

## 2020-12-07 DIAGNOSIS — Z9181 History of falling: Secondary | ICD-10-CM | POA: Diagnosis not present

## 2020-12-07 DIAGNOSIS — D696 Thrombocytopenia, unspecified: Secondary | ICD-10-CM | POA: Diagnosis not present

## 2020-12-07 DIAGNOSIS — M48061 Spinal stenosis, lumbar region without neurogenic claudication: Secondary | ICD-10-CM | POA: Diagnosis not present

## 2020-12-07 DIAGNOSIS — S68012D Complete traumatic metacarpophalangeal amputation of left thumb, subsequent encounter: Secondary | ICD-10-CM | POA: Diagnosis not present

## 2020-12-07 DIAGNOSIS — Z7901 Long term (current) use of anticoagulants: Secondary | ICD-10-CM | POA: Diagnosis not present

## 2020-12-07 DIAGNOSIS — I110 Hypertensive heart disease with heart failure: Secondary | ICD-10-CM | POA: Diagnosis not present

## 2020-12-07 DIAGNOSIS — I5033 Acute on chronic diastolic (congestive) heart failure: Secondary | ICD-10-CM | POA: Diagnosis not present

## 2020-12-07 DIAGNOSIS — L570 Actinic keratosis: Secondary | ICD-10-CM | POA: Diagnosis not present

## 2020-12-07 DIAGNOSIS — I8310 Varicose veins of unspecified lower extremity with inflammation: Secondary | ICD-10-CM | POA: Diagnosis not present

## 2020-12-09 DIAGNOSIS — Z7901 Long term (current) use of anticoagulants: Secondary | ICD-10-CM | POA: Diagnosis not present

## 2020-12-09 DIAGNOSIS — J9601 Acute respiratory failure with hypoxia: Secondary | ICD-10-CM | POA: Diagnosis not present

## 2020-12-09 DIAGNOSIS — I480 Paroxysmal atrial fibrillation: Secondary | ICD-10-CM | POA: Diagnosis not present

## 2020-12-09 DIAGNOSIS — Z96641 Presence of right artificial hip joint: Secondary | ICD-10-CM | POA: Diagnosis not present

## 2020-12-09 DIAGNOSIS — S68012D Complete traumatic metacarpophalangeal amputation of left thumb, subsequent encounter: Secondary | ICD-10-CM | POA: Diagnosis not present

## 2020-12-09 DIAGNOSIS — M48061 Spinal stenosis, lumbar region without neurogenic claudication: Secondary | ICD-10-CM | POA: Diagnosis not present

## 2020-12-09 DIAGNOSIS — L570 Actinic keratosis: Secondary | ICD-10-CM | POA: Diagnosis not present

## 2020-12-09 DIAGNOSIS — D696 Thrombocytopenia, unspecified: Secondary | ICD-10-CM | POA: Diagnosis not present

## 2020-12-09 DIAGNOSIS — I8310 Varicose veins of unspecified lower extremity with inflammation: Secondary | ICD-10-CM | POA: Diagnosis not present

## 2020-12-09 DIAGNOSIS — Z8673 Personal history of transient ischemic attack (TIA), and cerebral infarction without residual deficits: Secondary | ICD-10-CM | POA: Diagnosis not present

## 2020-12-09 DIAGNOSIS — Z87891 Personal history of nicotine dependence: Secondary | ICD-10-CM | POA: Diagnosis not present

## 2020-12-09 DIAGNOSIS — E871 Hypo-osmolality and hyponatremia: Secondary | ICD-10-CM | POA: Diagnosis not present

## 2020-12-09 DIAGNOSIS — I5033 Acute on chronic diastolic (congestive) heart failure: Secondary | ICD-10-CM | POA: Diagnosis not present

## 2020-12-09 DIAGNOSIS — E781 Pure hyperglyceridemia: Secondary | ICD-10-CM | POA: Diagnosis not present

## 2020-12-09 DIAGNOSIS — M5136 Other intervertebral disc degeneration, lumbar region: Secondary | ICD-10-CM | POA: Diagnosis not present

## 2020-12-09 DIAGNOSIS — Z7982 Long term (current) use of aspirin: Secondary | ICD-10-CM | POA: Diagnosis not present

## 2020-12-09 DIAGNOSIS — D6869 Other thrombophilia: Secondary | ICD-10-CM | POA: Diagnosis not present

## 2020-12-09 DIAGNOSIS — I110 Hypertensive heart disease with heart failure: Secondary | ICD-10-CM | POA: Diagnosis not present

## 2020-12-09 DIAGNOSIS — Z9181 History of falling: Secondary | ICD-10-CM | POA: Diagnosis not present

## 2020-12-09 DIAGNOSIS — K5730 Diverticulosis of large intestine without perforation or abscess without bleeding: Secondary | ICD-10-CM | POA: Diagnosis not present

## 2020-12-09 DIAGNOSIS — M47812 Spondylosis without myelopathy or radiculopathy, cervical region: Secondary | ICD-10-CM | POA: Diagnosis not present

## 2020-12-11 ENCOUNTER — Other Ambulatory Visit: Payer: Self-pay

## 2020-12-11 ENCOUNTER — Inpatient Hospital Stay (HOSPITAL_BASED_OUTPATIENT_CLINIC_OR_DEPARTMENT_OTHER)
Admission: EM | Admit: 2020-12-11 | Discharge: 2020-12-19 | DRG: 291 | Disposition: A | Discharge status: Hospice | Payer: Medicare Other | Attending: Family Medicine | Admitting: Family Medicine

## 2020-12-11 ENCOUNTER — Encounter (HOSPITAL_BASED_OUTPATIENT_CLINIC_OR_DEPARTMENT_OTHER): Payer: Self-pay | Admitting: Emergency Medicine

## 2020-12-11 ENCOUNTER — Emergency Department (HOSPITAL_BASED_OUTPATIENT_CLINIC_OR_DEPARTMENT_OTHER): Payer: Medicare Other

## 2020-12-11 DIAGNOSIS — J849 Interstitial pulmonary disease, unspecified: Secondary | ICD-10-CM | POA: Diagnosis not present

## 2020-12-11 DIAGNOSIS — I639 Cerebral infarction, unspecified: Secondary | ICD-10-CM | POA: Diagnosis present

## 2020-12-11 DIAGNOSIS — I672 Cerebral atherosclerosis: Secondary | ICD-10-CM | POA: Diagnosis not present

## 2020-12-11 DIAGNOSIS — Z8673 Personal history of transient ischemic attack (TIA), and cerebral infarction without residual deficits: Secondary | ICD-10-CM | POA: Diagnosis not present

## 2020-12-11 DIAGNOSIS — Z789 Other specified health status: Secondary | ICD-10-CM | POA: Diagnosis not present

## 2020-12-11 DIAGNOSIS — I872 Venous insufficiency (chronic) (peripheral): Secondary | ICD-10-CM | POA: Diagnosis not present

## 2020-12-11 DIAGNOSIS — I5033 Acute on chronic diastolic (congestive) heart failure: Secondary | ICD-10-CM | POA: Diagnosis not present

## 2020-12-11 DIAGNOSIS — J9601 Acute respiratory failure with hypoxia: Secondary | ICD-10-CM | POA: Diagnosis present

## 2020-12-11 DIAGNOSIS — H5347 Heteronymous bilateral field defects: Secondary | ICD-10-CM | POA: Diagnosis not present

## 2020-12-11 DIAGNOSIS — I6523 Occlusion and stenosis of bilateral carotid arteries: Secondary | ICD-10-CM | POA: Diagnosis not present

## 2020-12-11 DIAGNOSIS — G9340 Encephalopathy, unspecified: Secondary | ICD-10-CM | POA: Diagnosis not present

## 2020-12-11 DIAGNOSIS — R54 Age-related physical debility: Secondary | ICD-10-CM | POA: Diagnosis present

## 2020-12-11 DIAGNOSIS — I5030 Unspecified diastolic (congestive) heart failure: Secondary | ICD-10-CM | POA: Diagnosis not present

## 2020-12-11 DIAGNOSIS — I63133 Cerebral infarction due to embolism of bilateral carotid arteries: Secondary | ICD-10-CM | POA: Diagnosis not present

## 2020-12-11 DIAGNOSIS — R1312 Dysphagia, oropharyngeal phase: Secondary | ICD-10-CM | POA: Diagnosis not present

## 2020-12-11 DIAGNOSIS — I4891 Unspecified atrial fibrillation: Secondary | ICD-10-CM | POA: Diagnosis present

## 2020-12-11 DIAGNOSIS — R0689 Other abnormalities of breathing: Secondary | ICD-10-CM | POA: Diagnosis not present

## 2020-12-11 DIAGNOSIS — E871 Hypo-osmolality and hyponatremia: Secondary | ICD-10-CM | POA: Diagnosis not present

## 2020-12-11 DIAGNOSIS — J690 Pneumonitis due to inhalation of food and vomit: Secondary | ICD-10-CM | POA: Diagnosis present

## 2020-12-11 DIAGNOSIS — I499 Cardiac arrhythmia, unspecified: Secondary | ICD-10-CM | POA: Diagnosis not present

## 2020-12-11 DIAGNOSIS — R531 Weakness: Secondary | ICD-10-CM | POA: Diagnosis not present

## 2020-12-11 DIAGNOSIS — J9811 Atelectasis: Secondary | ICD-10-CM | POA: Diagnosis not present

## 2020-12-11 DIAGNOSIS — N179 Acute kidney failure, unspecified: Secondary | ICD-10-CM | POA: Diagnosis present

## 2020-12-11 DIAGNOSIS — R471 Dysarthria and anarthria: Secondary | ICD-10-CM | POA: Diagnosis present

## 2020-12-11 DIAGNOSIS — Z711 Person with feared health complaint in whom no diagnosis is made: Secondary | ICD-10-CM | POA: Diagnosis not present

## 2020-12-11 DIAGNOSIS — Z7189 Other specified counseling: Secondary | ICD-10-CM | POA: Diagnosis not present

## 2020-12-11 DIAGNOSIS — G8194 Hemiplegia, unspecified affecting left nondominant side: Secondary | ICD-10-CM | POA: Diagnosis not present

## 2020-12-11 DIAGNOSIS — Z515 Encounter for palliative care: Secondary | ICD-10-CM

## 2020-12-11 DIAGNOSIS — I6623 Occlusion and stenosis of bilateral posterior cerebral arteries: Secondary | ICD-10-CM | POA: Diagnosis not present

## 2020-12-11 DIAGNOSIS — R000 Tachycardia, unspecified: Secondary | ICD-10-CM | POA: Diagnosis not present

## 2020-12-11 DIAGNOSIS — Z66 Do not resuscitate: Secondary | ICD-10-CM | POA: Diagnosis present

## 2020-12-11 DIAGNOSIS — D696 Thrombocytopenia, unspecified: Secondary | ICD-10-CM | POA: Diagnosis not present

## 2020-12-11 DIAGNOSIS — I69398 Other sequelae of cerebral infarction: Secondary | ICD-10-CM | POA: Diagnosis not present

## 2020-12-11 DIAGNOSIS — R0602 Shortness of breath: Secondary | ICD-10-CM

## 2020-12-11 DIAGNOSIS — Z79899 Other long term (current) drug therapy: Secondary | ICD-10-CM

## 2020-12-11 DIAGNOSIS — Z7901 Long term (current) use of anticoagulants: Secondary | ICD-10-CM

## 2020-12-11 DIAGNOSIS — I509 Heart failure, unspecified: Secondary | ICD-10-CM | POA: Diagnosis not present

## 2020-12-11 DIAGNOSIS — I469 Cardiac arrest, cause unspecified: Secondary | ICD-10-CM | POA: Diagnosis not present

## 2020-12-11 DIAGNOSIS — E876 Hypokalemia: Secondary | ICD-10-CM | POA: Diagnosis present

## 2020-12-11 DIAGNOSIS — J9602 Acute respiratory failure with hypercapnia: Secondary | ICD-10-CM | POA: Diagnosis present

## 2020-12-11 DIAGNOSIS — I110 Hypertensive heart disease with heart failure: Principal | ICD-10-CM | POA: Diagnosis present

## 2020-12-11 DIAGNOSIS — R0902 Hypoxemia: Secondary | ICD-10-CM | POA: Diagnosis not present

## 2020-12-11 DIAGNOSIS — Z87891 Personal history of nicotine dependence: Secondary | ICD-10-CM

## 2020-12-11 DIAGNOSIS — Z7401 Bed confinement status: Secondary | ICD-10-CM | POA: Diagnosis not present

## 2020-12-11 DIAGNOSIS — I4821 Permanent atrial fibrillation: Secondary | ICD-10-CM | POA: Diagnosis present

## 2020-12-11 DIAGNOSIS — R29810 Facial weakness: Secondary | ICD-10-CM | POA: Diagnosis not present

## 2020-12-11 DIAGNOSIS — N400 Enlarged prostate without lower urinary tract symptoms: Secondary | ICD-10-CM | POA: Diagnosis present

## 2020-12-11 DIAGNOSIS — I10 Essential (primary) hypertension: Secondary | ICD-10-CM | POA: Diagnosis not present

## 2020-12-11 DIAGNOSIS — I517 Cardiomegaly: Secondary | ICD-10-CM | POA: Diagnosis not present

## 2020-12-11 DIAGNOSIS — Z7982 Long term (current) use of aspirin: Secondary | ICD-10-CM

## 2020-12-11 DIAGNOSIS — E785 Hyperlipidemia, unspecified: Secondary | ICD-10-CM | POA: Diagnosis present

## 2020-12-11 DIAGNOSIS — Z743 Need for continuous supervision: Secondary | ICD-10-CM | POA: Diagnosis not present

## 2020-12-11 DIAGNOSIS — I771 Stricture of artery: Secondary | ICD-10-CM | POA: Diagnosis not present

## 2020-12-11 DIAGNOSIS — I6389 Other cerebral infarction: Secondary | ICD-10-CM | POA: Diagnosis not present

## 2020-12-11 DIAGNOSIS — R404 Transient alteration of awareness: Secondary | ICD-10-CM | POA: Diagnosis not present

## 2020-12-11 DIAGNOSIS — R638 Other symptoms and signs concerning food and fluid intake: Secondary | ICD-10-CM | POA: Diagnosis not present

## 2020-12-11 DIAGNOSIS — Z20822 Contact with and (suspected) exposure to covid-19: Secondary | ICD-10-CM | POA: Diagnosis not present

## 2020-12-11 DIAGNOSIS — G319 Degenerative disease of nervous system, unspecified: Secondary | ICD-10-CM | POA: Diagnosis not present

## 2020-12-11 DIAGNOSIS — R918 Other nonspecific abnormal finding of lung field: Secondary | ICD-10-CM | POA: Diagnosis not present

## 2020-12-11 LAB — CBC WITH DIFFERENTIAL/PLATELET
Abs Immature Granulocytes: 0.05 10*3/uL (ref 0.00–0.07)
Basophils Absolute: 0 10*3/uL (ref 0.0–0.1)
Basophils Relative: 0 %
Eosinophils Absolute: 0.1 10*3/uL (ref 0.0–0.5)
Eosinophils Relative: 1 %
HCT: 43.4 % (ref 39.0–52.0)
Hemoglobin: 13.9 g/dL (ref 13.0–17.0)
Immature Granulocytes: 1 %
Lymphocytes Relative: 11 %
Lymphs Abs: 0.9 10*3/uL (ref 0.7–4.0)
MCH: 28.1 pg (ref 26.0–34.0)
MCHC: 32 g/dL (ref 30.0–36.0)
MCV: 87.7 fL (ref 80.0–100.0)
Monocytes Absolute: 0.8 10*3/uL (ref 0.1–1.0)
Monocytes Relative: 11 %
Neutro Abs: 6 10*3/uL (ref 1.7–7.7)
Neutrophils Relative %: 76 %
Platelets: UNDETERMINED 10*3/uL (ref 150–400)
RBC: 4.95 MIL/uL (ref 4.22–5.81)
RDW: 14.7 % (ref 11.5–15.5)
Smear Review: UNDETERMINED
WBC: 7.9 10*3/uL (ref 4.0–10.5)
nRBC: 0 % (ref 0.0–0.2)

## 2020-12-11 LAB — PROTIME-INR
INR: 1.6 — ABNORMAL HIGH (ref 0.8–1.2)
Prothrombin Time: 18.8 seconds — ABNORMAL HIGH (ref 11.4–15.2)

## 2020-12-11 LAB — BASIC METABOLIC PANEL
Anion gap: 8 (ref 5–15)
BUN: 11 mg/dL (ref 8–23)
CO2: 34 mmol/L — ABNORMAL HIGH (ref 22–32)
Calcium: 8.7 mg/dL — ABNORMAL LOW (ref 8.9–10.3)
Chloride: 93 mmol/L — ABNORMAL LOW (ref 98–111)
Creatinine, Ser: 1.05 mg/dL (ref 0.61–1.24)
GFR, Estimated: >60 mL/min (ref >60)
Glucose, Bld: 115 mg/dL — ABNORMAL HIGH (ref 70–99)
Potassium: 3.1 mmol/L — ABNORMAL LOW (ref 3.5–5.1)
Sodium: 135 mmol/L (ref 135–145)

## 2020-12-11 LAB — MAGNESIUM: Magnesium: 1.9 mg/dL (ref 1.7–2.4)

## 2020-12-11 LAB — LACTIC ACID, PLASMA
Lactic Acid, Venous: 1.3 mmol/L (ref 0.5–1.9)
Lactic Acid, Venous: 1.4 mmol/L (ref 0.5–1.9)

## 2020-12-11 LAB — RESP PANEL BY RT-PCR (FLU A&B, COVID) ARPGX2
Influenza A by PCR: NEGATIVE
Influenza B by PCR: NEGATIVE
SARS Coronavirus 2 by RT PCR: NEGATIVE

## 2020-12-11 LAB — COMPREHENSIVE METABOLIC PANEL
ALT: 16 U/L (ref 0–44)
AST: 23 U/L (ref 15–41)
Albumin: 4 g/dL (ref 3.5–5.0)
Alkaline Phosphatase: 55 U/L (ref 38–126)
Anion gap: 8 (ref 5–15)
BUN: 14 mg/dL (ref 8–23)
CO2: 34 mmol/L — ABNORMAL HIGH (ref 22–32)
Calcium: 9.9 mg/dL (ref 8.9–10.3)
Chloride: 93 mmol/L — ABNORMAL LOW (ref 98–111)
Creatinine, Ser: 0.96 mg/dL (ref 0.61–1.24)
GFR, Estimated: >60 mL/min (ref >60)
Glucose, Bld: 129 mg/dL — ABNORMAL HIGH (ref 70–99)
Potassium: 3.2 mmol/L — ABNORMAL LOW (ref 3.5–5.1)
Sodium: 135 mmol/L (ref 135–145)
Total Bilirubin: 0.8 mg/dL (ref 0.3–1.2)
Total Protein: 8.6 g/dL — ABNORMAL HIGH (ref 6.5–8.1)

## 2020-12-11 LAB — BRAIN NATRIURETIC PEPTIDE: B Natriuretic Peptide: 231.3 pg/mL — ABNORMAL HIGH (ref 0.0–100.0)

## 2020-12-11 LAB — TROPONIN I (HIGH SENSITIVITY)
Troponin I (High Sensitivity): 41 ng/L — ABNORMAL HIGH (ref <18)
Troponin I (High Sensitivity): 38 ng/L — ABNORMAL HIGH (ref <18)

## 2020-12-11 MED ORDER — ALBUTEROL SULFATE HFA 108 (90 BASE) MCG/ACT IN AERS
2.0000 | INHALATION_SPRAY | RESPIRATORY_TRACT | Status: DC | PRN
  Administered 2020-12-11: 2 via RESPIRATORY_TRACT
  Filled 2020-12-11: qty 6.7

## 2020-12-11 MED ORDER — SODIUM CHLORIDE 0.9% FLUSH
3.0000 mL | Freq: Two times a day (BID) | INTRAVENOUS | Status: DC
  Administered 2020-12-11 – 2020-12-18 (×15): 3 mL via INTRAVENOUS

## 2020-12-11 MED ORDER — METOPROLOL SUCCINATE ER 100 MG PO TB24
100.0000 mg | ORAL_TABLET | Freq: Every day | ORAL | Status: DC
  Filled 2020-12-11: qty 4

## 2020-12-11 MED ORDER — WARFARIN - PHARMACIST DOSING INPATIENT
Freq: Every day | Status: DC
  Filled 2020-12-11: qty 1

## 2020-12-11 MED ORDER — ACETAMINOPHEN 325 MG PO TABS
650.0000 mg | ORAL_TABLET | Freq: Four times a day (QID) | ORAL | Status: DC | PRN
  Administered 2020-12-17: 650 mg via ORAL
  Filled 2020-12-11: qty 2

## 2020-12-11 MED ORDER — POLYETHYLENE GLYCOL 3350 17 G PO PACK: 17.0000 g | PACK | Freq: Every day | ORAL | Status: DC | PRN

## 2020-12-11 MED ORDER — WARFARIN SODIUM 2.5 MG PO TABS
2.5000 mg | ORAL_TABLET | Freq: Once | ORAL | Status: DC
  Filled 2020-12-11: qty 1

## 2020-12-11 MED ORDER — FUROSEMIDE 10 MG/ML IJ SOLN
80.0000 mg | Freq: Two times a day (BID) | INTRAMUSCULAR | Status: DC
  Administered 2020-12-12 – 2020-12-13 (×3): 80 mg via INTRAVENOUS
  Filled 2020-12-11 (×3): qty 8

## 2020-12-11 MED ORDER — GLUCOSAMINE 500 MG PO CAPS: 1500.0000 mg | ORAL_CAPSULE | Freq: Every day | ORAL | Status: DC

## 2020-12-11 MED ORDER — POTASSIUM CHLORIDE CRYS ER 20 MEQ PO TBCR
20.0000 meq | EXTENDED_RELEASE_TABLET | Freq: Once | ORAL | Status: DC
  Filled 2020-12-11: qty 1

## 2020-12-11 MED ORDER — POTASSIUM CHLORIDE 10 MEQ/100ML IV SOLN
10.0000 meq | INTRAVENOUS | Status: AC
  Administered 2020-12-11 – 2020-12-12 (×5): 10 meq via INTRAVENOUS
  Filled 2020-12-11 (×5): qty 100

## 2020-12-11 MED ORDER — WARFARIN SODIUM 5 MG PO TABS: 5.0000 mg | ORAL_TABLET | ORAL | Status: DC

## 2020-12-11 MED ORDER — LABETALOL HCL 5 MG/ML IV SOLN: 5.0000 mg | INTRAVENOUS | Status: DC | PRN

## 2020-12-11 MED ORDER — ACETAMINOPHEN 650 MG RE SUPP: 650.0000 mg | Freq: Four times a day (QID) | RECTAL | Status: DC | PRN

## 2020-12-11 MED ORDER — ALBUTEROL SULFATE HFA 108 (90 BASE) MCG/ACT IN AERS: 1.0000 | INHALATION_SPRAY | RESPIRATORY_TRACT | Status: DC | PRN

## 2020-12-11 MED ORDER — FUROSEMIDE 10 MG/ML IJ SOLN
80.0000 mg | Freq: Once | INTRAMUSCULAR | Status: AC
  Administered 2020-12-11: 80 mg via INTRAVENOUS
  Filled 2020-12-11: qty 8

## 2020-12-11 MED ORDER — ASPIRIN EC 81 MG PO TBEC: 81.0000 mg | DELAYED_RELEASE_TABLET | Freq: Every day | ORAL | Status: DC

## 2020-12-11 NOTE — ED Provider Notes (Signed)
[facility] EMERGENCY DEPT Provider Note   CSN: [id number] Arrival date & time: 12/18/2020  1305     History Chief Complaint  Patient presents with   hypoxic    [name] is a 87 y.o. male.  HPI  87 year old male with a history of atrial fibrillation on Coumadin, prior CVA, HTN, CHF with preserved ejection fraction presenting to the emergency department with hypoxia and shortness of breath.  The patient states that he is on Lasix 40 mg at home and has been taking his medication.  Over the last week he has had a progressive weight gain with worsening lower extremity edema bilaterally.  He also endorses worsening dyspnea on exertion and orthopnea.  Family members noted that his O2 saturations were in the 70s at home with progressively worsening shortness of breath.  He endorses a mild cough, no fevers or chills.  He was last admitted to the [facility] in October 2022 for hypoxic respiratory failure secondary to acute diastolic heart failure exacerbation.  He is unsure of his dry weight but notes an appreciable weight gain over the past week.  Past Medical History:  Diagnosis Date   Atrial fibrillation ([place]) 10/15/2012   Cancer ([place])    Skin (R hand)   Embolic stroke ([place]) 70/96/4383   Essential hypertension 01/21/2014   Long term current use of anticoagulant therapy 12/25/2012    Patient Active Problem List   Diagnosis Date Noted   Hyponatremia 10/14/2020   Hyperbilirubinemia 10/14/2020   Acute respiratory failure with hypoxia ([place]) 10/14/2020   (HFpEF) heart failure with preserved ejection fraction ([place]) 10/14/2020   Subdural hematoma 10/22/2015   Thrombocytopenia ([place]) 10/22/2015   Chronic venous stasis dermatitis 10/22/2015   Fall at home, initial encounter    Head injury    Essential hypertension 81/84/0375   Hx of Embolic stroke ([place]) 43/60/6770   Long term current use of anticoagulant therapy 12/25/2012   Atrial fibrillation ([place]) 10/15/2012     Past Surgical History:  Procedure Laterality Date   APPENDECTOMY     CARDIAC CATHETERIZATION     CYSTOSCOPY WITH INSERTION OF UROLIFT N/A 10/10/2017   Procedure: CYSTOSCOPY WITH INSERTION OF UROLIFT;  Surgeon: [name], MD;  Location: [facility] ORS;  Service: Urology;  Laterality: N/A;  NEEDS 60 MIN   EYE SURGERY Bilateral 2013   Cataracts       Family History  Problem Relation Age of Onset   Other Mother        Brain hemmorrhage   Other Father        unknown    Social History   Tobacco Use   Smoking status: Former    Packs/day: 0.25    Years: 10.00    Pack years: 2.50    Types: Cigarettes   Smokeless tobacco: Never  Vaping Use   Vaping Use: Never used  Substance Use Topics   Alcohol use: No    Alcohol/week: 0.0 standard drinks   Drug use: No    Home Medications Prior to Admission medications   Medication Sig Start Date End Date Taking? Authorizing Provider  acetaminophen (TYLENOL) 500 MG tablet Take 500-1,000 mg by mouth every 6 (six) hours as needed (FOR PAIN.).    [provider]  albuterol (VENTOLIN HFA) 108 (90 Base) MCG/ACT inhaler Inhale 1 puff into the lungs every 4 (four) hours as needed for wheezing or shortness of breath. 07/02/20   [provider]  aspirin EC 81 MG tablet Take 81 mg by  mouth at bedtime.    [provider]  calcium carbonate (OS-CAL) 600 MG TABS tablet Take 600 mg by mouth daily.     [provider]  furosemide (LASIX) 40 MG tablet Take 1 tablet (40 mg total) by mouth daily. 10/23/20   Little Ishikawa, MD  Glucosamine HCl (GLUCOSAMINE PO) Take 1,500 mg by mouth daily.     [provider]  metoprolol succinate (TOPROL-XL) 100 MG 24 hr tablet Take 1 tablet (100 mg total) by mouth daily. Take with or immediately following a meal. 10/23/20   Little Ishikawa, MD  Multiple Vitamin (MULTIVITAMIN) tablet Take 1 tablet by mouth daily.    [provider]  Multiple Vitamins-Minerals  (PRESERVISION AREDS 2) CAPS Take 1 capsule by mouth 2 (two) times daily.    [provider]  traMADol (ULTRAM) 50 MG tablet Take 1 tablet (50 mg total) by mouth every 6 (six) hours as needed. 10/22/20   Little Ishikawa, MD  warfarin (COUMADIN) 1 MG tablet Take as directed by the coumadin dept 11/05/20   [provider]  warfarin (COUMADIN) 3 MG tablet Take as directed by coumadin dept 11/04/20   [provider]  warfarin (COUMADIN) 5 MG tablet TAKE AS DIRECTED PER ANTICOAGULATION CLINIC Patient taking differently: Take 5-7.5 mg by mouth See admin instructions. 7.5 mg on Sunday 5 mg Monday,Tuesday,Wednesday,Thursday,Friday,saturday 09/15/20   Leanor Kail, PA    Allergies    Patient has no known allergies.  Review of Systems   Review of Systems  Constitutional:  Negative for chills and fever.  Respiratory:  Positive for cough and shortness of breath.   Cardiovascular:  Positive for leg swelling. Negative for chest pain and palpitations.  Gastrointestinal:  Negative for abdominal pain and vomiting.  Musculoskeletal:  Negative for arthralgias and back pain.  Skin:  Negative for color change and rash.  All other systems reviewed and are negative.  Physical Exam Updated Vital Signs BP (!) 164/93   Pulse 78   Temp 97.9 F (36.6 C)   Resp 15   Ht 5\' 10"  (1.778 m)   SpO2 100%   BMI 28.53 kg/m   Physical Exam Vitals and nursing note reviewed.  Constitutional:      General: He is not in acute distress. HENT:     Head: Normocephalic and atraumatic.  Eyes:     Conjunctiva/sclera: Conjunctivae normal.     Pupils: Pupils are equal, round, and reactive to light.  Cardiovascular:     Rate and Rhythm: Normal rate. Rhythm irregular.  Pulmonary:     Effort: Tachypnea, accessory muscle usage and respiratory distress present.     Breath sounds: Examination of the right-upper field reveals rales. Examination of the left-upper field reveals rales.  Examination of the right-middle field reveals rhonchi and rales. Examination of the left-middle field reveals rales. Examination of the right-lower field reveals rales. Examination of the left-lower field reveals rales. Rhonchi and rales present.  Abdominal:     General: There is no distension.     Tenderness: There is no guarding.  Musculoskeletal:        General: No deformity or signs of injury.     Cervical back: Neck supple.     Right lower leg: Edema present.     Left lower leg: Edema present.     Comments: 3+ pitting edema bilaterally in the lower extremities  Skin:    Findings: No lesion or rash.  Neurological:     General: No  focal deficit present.     Mental Status: He is alert. Mental status is at baseline.    ED Results / Procedures / Treatments   Labs (all labs ordered are listed, but only abnormal results are displayed) Labs Reviewed  BRAIN NATRIURETIC PEPTIDE - Abnormal; Notable for the following components:      Result Value   B Natriuretic Peptide 231.3 (*)    All other components within normal limits  COMPREHENSIVE METABOLIC PANEL - Abnormal; Notable for the following components:   Potassium 3.2 (*)    Chloride 93 (*)    CO2 34 (*)    Glucose, Bld 129 (*)    Total Protein 8.6 (*)    All other components within normal limits  PROTIME-INR - Abnormal; Notable for the following components:   Prothrombin Time 18.8 (*)    INR 1.6 (*)    All other components within normal limits  TROPONIN I (HIGH SENSITIVITY) - Abnormal; Notable for the following components:   Troponin I (High Sensitivity) 41 (*)    All other components within normal limits  RESP PANEL BY RT-PCR (FLU A&B, COVID) ARPGX2  CBC WITH DIFFERENTIAL/PLATELET  LACTIC ACID, PLASMA  LACTIC ACID, PLASMA  MAGNESIUM  TROPONIN I (HIGH SENSITIVITY)    EKG EKG Interpretation  Date/Time:  Friday December 11 2020 13:30:28 EST Ventricular Rate:  91 PR Interval:    QRS Duration: 84 QT Interval:  364 QTC  Calculation: 447 R Axis:   65 Text Interpretation: Atrial fibrillation Septal infarct , age undetermined Abnormal ECG Confirmed by Regan Lemming ([id number]) on 01/03/2021 3:43:52 PM  Radiology DG Chest Portable 1 View  Result Date: 12/22/2020 CLINICAL DATA:  Shortness of breath. EXAM: PORTABLE CHEST 1 VIEW COMPARISON:  10/18/2020 FINDINGS: Low volume, AP portable radiograph. Patient rotated left. Cardiomegaly accentuated by AP portable technique. Atherosclerosis in the transverse aorta. Moderate right hemidiaphragm elevation. No large pleural effusion. No pneumothorax. Pulmonary interstitial prominence, accentuated by AP portable technique and low lung volumes. Possible left greater than right base airspace disease. IMPRESSION: Multifocal degradation, including patient positioning and AP portable technique. Cardiomegaly with suspicion of mild interstitial edema. Cannot exclude bibasilar airspace disease. Consider PA and lateral radiographs if feasible. Aortic Atherosclerosis (ICD10-I70.0). Electronically Signed   By: Abigail Miyamoto M.D.   On: 12/16/2020 14:08    Procedures .Critical Care Performed by: Regan Lemming, MD Authorized by: Regan Lemming, MD   Critical care provider statement:    Critical care time (minutes):  35   Critical care was necessary to treat or prevent imminent or life-threatening deterioration of the following conditions:  Respiratory failure   Critical care was time spent personally by me on the following activities:  Development of treatment plan with patient or surrogate, examination of patient, obtaining history from patient or surrogate, ordering and performing treatments and interventions, ordering and review of laboratory studies, ordering and review of radiographic studies, pulse oximetry, re-evaluation of patient's condition and review of old charts   Care discussed with: admitting provider     Medications Ordered in ED Medications  albuterol (VENTOLIN HFA) 108 (90 Base)  MCG/ACT inhaler 2 puff (2 puffs Inhalation Given 12/12/2020 1409)  potassium chloride SA (KLOR-CON M) CR tablet 20 mEq (has no administration in time range)  furosemide (LASIX) injection 80 mg (80 mg Intravenous Given 12/18/2020 1614)    ED Course  I have reviewed the triage vital signs and the nursing notes.  Pertinent labs & imaging results that were available during my care of  the patient were reviewed by me and considered in my medical decision making (see chart for details).    MDM Rules/Calculators/A&P                            87 year old male with a history of atrial fibrillation on Coumadin, prior CVA, HTN, CHF with preserved ejection fraction presenting to the emergency department with hypoxia and shortness of breath.  The patient states that he is on Lasix 40 mg at home and has been taking his medication.  Over the last week he has had a progressive weight gain with worsening lower extremity edema bilaterally.  He also endorses worsening dyspnea on exertion and orthopnea.  Family members noted that his O2 saturations were in the 70s at home with progressively worsening shortness of breath.  He endorses a mild cough, no fevers or chills.  He was last admitted to the [facility] in October 2022 for hypoxic respiratory failure secondary to acute diastolic heart failure exacerbation.  He is unsure of his dry weight but notes an appreciable weight gain over the past week.  On arrival, the patient was afebrile, tachycardic P101, hypertensive BP 155/87, hypoxic to 88% on room air, subsequently escalated to 4 L O2 via nasal cannula.  He was also notably hypertensive BP 179/106.  Physical exam concerning for rales in multiple lung fields, 3+ pitting edema bilaterally in the lower extremities which in combination with the patient's worsening dyspnea on exertion, orthopnea, tachypnea in the setting of known diastolic heart failure, concern for CHF exacerbation.  The patient did not appear to be  in severe extremis to the point where flash pulmonary edema was immediate concern however, he had increased work of breathing and was progressively tiring and was subsequently placed on BiPAP with improvement to O2 saturations 100% on room air, significant reduction in the patient's work of breathing.  Patient denies any chest pain at this time.  An EKG was performed which revealed atrial fibrillation, no significant ST segment changes.  A laboratory work-up was significant for a nonspecifically intermediately elevated BNP to 231, nonspecifically mildly elevated cardiac troponin to 41.  A COVID-19 and influenza PCR was collected and pending.  A chest x-ray revealed multifocal degradation with cardiomegaly and suspicion of mild interstitial edema.  An INR was mildly subtherapeutic at 1.6, potassium was mildly hypokalemic to 3.2.   The patient is on 40 mg of oral Lasix at home but presents with worsening lower extremity edema.  80 mg of IV Lasix was administered and a condom catheter was placed.  Given the patient's acute hypoxic respiratory failure requiring BiPAP, hypervolemia in the setting of CHF exacerbation, hospitalist medicine was consulted for admission.  The patient was hemodynamically stable at time of admission. Spoke with Dr. Roosevelt Locks who accepted the patient in admission.   Final Clinical Impression(s) / ED Diagnoses Final diagnoses:  Acute on chronic congestive heart failure, unspecified heart failure type ([place])  Acute respiratory failure with hypoxia [facility])    Rx / DC Orders ED Discharge Orders     None        Regan Lemming, MD 01/07/2021 1648

## 2020-12-11 NOTE — H&P (Signed)
History and Physical   [name]:[id number] DOB: 05-04-1930 DOA: 12/27/2020  PCP: Danny Arabian, MD   Patient coming from: Home  Chief Complaint: Shortness of breath  HPI: [name] is a 87 y.o. male with medical history significant of CHF, atrial fibrillation, venous stasis, hypertension, history of CVA, thrombocytopenia presenting with ongoing shortness of breath.  Patient has had 1 week of symptoms.  He has had shortness of breath and has noticed increase weight gain as well as increased lower extremity edema.  His shortness of breath has been progressive and has noticed some dyspnea especially on exertion.  He also reports orthopnea.  His family members noted that his home sats had dropped into the 95s when he was very short of breath.  He also states he had a mild cough, Nonproductive.  He denies fevers, chills, chest pain, abdominal pain, constipation, diarrhea, nausea, vomiting.  ED Course: Vital signs in the ED significant for blood pressure in the 017P 102 systolic.  Was initially doing okay on 4 L however he was switched to BiPAP as he was tiring out.  Lab work-up showed CMP with potassium 3.2, chloride 93, bicarb 34, glucose 129.  CBC within normal limits.  PT and INR showed PT of 18.8 and INR subtherapeutic at 1.6.  BNP elevated to 231.  Troponin elevated at 41 but down trended to 38 on repeat.  Lactic acid normal x2.  Respiratory panel flu and COVID-negative.  Chest x-ray which showed a degraded exam but did show evidence of cardiomegaly and changes suspicious for interstitial edema.  Based on this exam unable to rule out airspace disease per radiology report.  Magnesium level also checked and was normal.  Patient received 80 mg of IV Lasix, labetalol, metoprolol, warfarin, albuterol, p.o. potassium in the ED.  And as above was placed on BiPAP due to the patient tiring out on 4 L.  Review of Systems: As per HPI otherwise all other systems reviewed and are negative.  Past  Medical History:  Diagnosis Date   Atrial fibrillation ([place]) 10/15/2012   Cancer ([place])    Skin (R hand)   Embolic stroke ([place]) 58/52/7782   Essential hypertension 01/21/2014   Long term current use of anticoagulant therapy 12/25/2012    Past Surgical History:  Procedure Laterality Date   APPENDECTOMY     CARDIAC CATHETERIZATION     CYSTOSCOPY WITH INSERTION OF UROLIFT N/A 10/10/2017   Procedure: CYSTOSCOPY WITH INSERTION OF UROLIFT;  Surgeon: [name], MD;  Location: [facility];  Service: Urology;  Laterality: N/A;  NEEDS 60 MIN   EYE SURGERY Bilateral 2013   Cataracts    Social History  reports that he has quit smoking. His smoking use included cigarettes. He has a 2.50 pack-year smoking history. He has never used smokeless tobacco. He reports that he does not drink alcohol and does not use drugs.  No Known Allergies  Family History  Problem Relation Age of Onset   Other Mother        Brain hemmorrhage   Other Father        unknown  Reviewed on admission  Prior to Admission medications   Medication Sig Start Date End Date Taking? Authorizing Provider  acetaminophen (TYLENOL) 500 MG tablet Take 500-1,000 mg by mouth every 6 (six) hours as needed (FOR PAIN.).    [provider]  albuterol (VENTOLIN HFA) 108 (90 Base) MCG/ACT inhaler Inhale 1 puff into the lungs every 4 (four) hours as needed for  wheezing or shortness of breath. 07/02/20   [provider]  aspirin EC 81 MG tablet Take 81 mg by mouth at bedtime.    [provider]  calcium carbonate (OS-CAL) 600 MG TABS tablet Take 600 mg by mouth daily.     [provider]  furosemide (LASIX) 40 MG tablet Take 1 tablet (40 mg total) by mouth daily. 10/23/20   Little Ishikawa, MD  Glucosamine HCl (GLUCOSAMINE PO) Take 1,500 mg by mouth daily.     [provider]  metoprolol succinate (TOPROL-XL) 100 MG 24 hr tablet Take 1 tablet (100 mg total) by mouth daily. Take with or  immediately following a meal. 10/23/20   Little Ishikawa, MD  Multiple Vitamin (MULTIVITAMIN) tablet Take 1 tablet by mouth daily.    [provider]  Multiple Vitamins-Minerals (PRESERVISION AREDS 2) CAPS Take 1 capsule by mouth 2 (two) times daily.    [provider]  traMADol (ULTRAM) 50 MG tablet Take 1 tablet (50 mg total) by mouth every 6 (six) hours as needed. 10/22/20   Little Ishikawa, MD  warfarin (COUMADIN) 1 MG tablet Take as directed by the coumadin dept 11/05/20   [provider]  warfarin (COUMADIN) 3 MG tablet Take as directed by coumadin dept 11/04/20   [provider]  warfarin (COUMADIN) 5 MG tablet TAKE AS DIRECTED PER ANTICOAGULATION CLINIC Patient taking differently: Take 5-7.5 mg by mouth See admin instructions. 7.5 mg on Sunday 5 mg Monday,Tuesday,Wednesday,Thursday,Friday,saturday 09/15/20   [name], [place]    Physical Exam: Vitals:   12/28/2020 1915 12/13/2020 1930 12/10/2020 1945 12/27/2020 2111  BP: (!) 150/93 (!) 160/91 (!) 142/102 (!) 148/94  Pulse: 81 67 83 82  Resp: 15 14 14 14   Temp:    97.6 F (36.4 C)  TempSrc:    Axillary  SpO2: 100% 99% 97% 97%  Weight:    91.5 kg  Height:    6\' 1"  (1.854 m)   Physical Exam Constitutional:      General: He is not in acute distress.    Appearance: Normal appearance.  HENT:     Head: Normocephalic and atraumatic.     Mouth/Throat:     Mouth: Mucous membranes are moist.     Pharynx: Oropharynx is clear.  Eyes:     Extraocular Movements: Extraocular movements intact.     Pupils: Pupils are equal, round, and reactive to light.  Cardiovascular:     Rate and Rhythm: Normal rate and regular rhythm.     Pulses: Normal pulses.     Heart sounds: Normal heart sounds.  Pulmonary:     Effort: Pulmonary effort is normal. No respiratory distress.     Breath sounds: Rales present.  Abdominal:     General: Bowel sounds are normal. There is no distension.     Palpations:  Abdomen is soft.     Tenderness: There is no abdominal tenderness.  Musculoskeletal:        General: No swelling or deformity.     Right lower leg: Edema present.     Left lower leg: Edema present.  Skin:    General: Skin is warm and dry.     Comments: Bilateral venous stasis changes  Neurological:     General: No focal deficit present.     Mental Status: Mental status is at baseline.   Labs on Admission: I have personally reviewed following labs and imaging studies  CBC: Recent Labs  Lab 01/06/2021 1400  WBC 7.9  NEUTROABS 6.0  HGB 13.9  HCT 43.4  MCV 87.7  PLT PLATELET CLUMPS NOTED ON SMEAR, UNABLE TO ESTIMATE    Basic Metabolic Panel: Recent Labs  Lab 12/18/2020 1400 12/30/2020 1550  NA 135  --   K 3.2*  --   CL 93*  --   CO2 34*  --   GLUCOSE 129*  --   BUN 14  --   CREATININE 0.96  --   CALCIUM 9.9  --   MG  --  1.9    GFR: Estimated Creatinine Clearance: 60.1 mL/min (by C-G formula based on SCr of 0.96 mg/dL).  Liver Function Tests: Recent Labs  Lab 12/26/2020 1400  AST 23  ALT 16  ALKPHOS 55  BILITOT 0.8  PROT 8.6*  ALBUMIN 4.0    Urine analysis:    Component Value Date/Time   COLORURINE YELLOW 10/13/2020 1950   APPEARANCEUR CLEAR 10/13/2020 1950   LABSPEC 1.016 10/13/2020 1950   PHURINE 6.0 10/13/2020 1950   GLUCOSEU NEGATIVE 10/13/2020 1950   HGBUR SMALL (A) 10/13/2020 1950   BILIRUBINUR NEGATIVE 10/13/2020 1950   KETONESUR NEGATIVE 10/13/2020 1950   PROTEINUR 100 (A) 10/13/2020 1950   NITRITE NEGATIVE 10/13/2020 1950   LEUKOCYTESUR NEGATIVE 10/13/2020 1950    Radiological Exams on Admission: DG Chest Portable 1 View  Result Date: 12/10/2020 CLINICAL DATA:  Shortness of breath. EXAM: PORTABLE CHEST 1 VIEW COMPARISON:  10/18/2020 FINDINGS: Low volume, AP portable radiograph. Patient rotated left. Cardiomegaly accentuated by AP portable technique. Atherosclerosis in the transverse aorta. Moderate right hemidiaphragm elevation. No large  pleural effusion. No pneumothorax. Pulmonary interstitial prominence, accentuated by AP portable technique and low lung volumes. Possible left greater than right base airspace disease. IMPRESSION: Multifocal degradation, including patient positioning and AP portable technique. Cardiomegaly with suspicion of mild interstitial edema. Cannot exclude bibasilar airspace disease. Consider PA and lateral radiographs if feasible. Aortic Atherosclerosis (ICD10-I70.0). Electronically Signed   By: Abigail Miyamoto M.D.   On: 12/27/2020 14:08    EKG: Independently reviewed.  Atrial fibrillation at 91 bpm.  Assessment/Plan Principal Problem:   CHF exacerbation [facility]) Active Problems:   Atrial fibrillation (HCC)   Hx of Embolic stroke (HCC)   Essential hypertension   Chronic venous stasis dermatitis  CHF exacerbation > Last echo and October of this year and stated EF 60-65% with inability to evaluate diastolic function.  Mildly elevated pulmonary artery pressure as well as severely dilated left atrium also noted. > Presenting with a week of worsening shortness of breath, weight gain, edema.  Also reporting orthopnea. > BNP elevated to 231.  Troponin flat at 41 and then 38 on repeat.  Chest x-ray with poor quality but did show findings suspicious for interstitial edema. > Improved initially on 4 L but then tired out and was placed on BiPAP in the ED. - Monitor in progressive with as needed BiPAP - Continue 80 mg IV Lasix twice daily - As needed BiPAP wean as tolerated - Strict I/O, Daily Weights - Hold off on repeat echo as last was 2 months ago - Replete potassium IV as now on BiPAP and did not get p.o. in the ED.  - Trend magnesium level.  Atrial fibrillation - Continue home metoprolol - Continue warfarin per pharmacy (was subtherapeutic at 1.6 in the ED)  Hypertension - Continue home metoprolol - On Lasix as above  History of stroke secondary to embolus - Continue home warfarin - Continue  aspirin  DVT prophylaxis: Warfarin  Code Status:   Full  Family Communication:  Family updated at bedside  Disposition Plan:   Patient is from:  Home  Anticipated DC to:  Home  Anticipated DC date:  1 to 3 days  Anticipated DC barriers: None  Consults called:  None  Admission status:  Inpatient, progressive  Severity of Illness: The appropriate patient status for this patient is INPATIENT. Inpatient status is judged to be reasonable and necessary in order to provide the required intensity of service to ensure the patient's safety. The patient's presenting symptoms, physical exam findings, and initial radiographic and laboratory data in the context of their chronic comorbidities is felt to place them at high risk for further clinical deterioration. Furthermore, it is not anticipated that the patient will be medically stable for discharge from the hospital within 2 midnights of admission.   * I certify that at the point of admission it is my clinical judgment that the patient will require inpatient hospital care spanning beyond 2 midnights from the point of admission due to high intensity of service, high risk for further deterioration and high frequency of surveillance required.Marcelyn Bruins MD [facility]  How to contact the [facility] Attending or Consulting provider [place] or covering provider during after hours [place], for this patient?   Check the care team in [facility] and look for a) attending/consulting [facility] provider listed and b) the [facility] team listed Log into [email or web address] and use [place]'s universal password to access. If you do not have the password, please contact the hospital operator. Locate the [facility] provider you are looking for under [facility] and page to a number that you can be directly reached. If you still have difficulty reaching the provider, please page the [facility] (Director on Call) for the Hospitalists listed on amion for assistance.  01/03/2021, 10:43 PM

## 2020-12-11 NOTE — ED Notes (Signed)
BMP to be collected upon arrival to [facility] cone per MD.

## 2020-12-11 NOTE — Progress Notes (Signed)
ANTICOAGULATION CONSULT NOTE - Follow Up Consult  Pharmacy Consult for Warfarin Indication: atrial fibrillation  No Known Allergies  Patient Measurements: Height: 5\' 10"  (177.8 cm) IBW/kg (Calculated) : 73  Vital Signs: Temp: 97.9 F (36.6 C) (12/02 1325) BP: 131/82 (12/02 1700) Pulse Rate: 87 (12/02 1700)  Labs: Recent Labs    12/10/2020 1400 12/12/2020 1550  HGB 13.9  --   HCT 43.4  --   PLT PLATELET CLUMPS NOTED ON SMEAR, UNABLE TO ESTIMATE  --   LABPROT 18.8*  --   INR 1.6*  --   CREATININE 0.96  --   TROPONINIHS 41* 38*    CrCl cannot be calculated (Unknown ideal weight.).   Medications:  (Not in a hospital admission)  Scheduled:   metoprolol succinate  100 mg Oral Daily   potassium chloride  20 mEq Oral Once   Infusions:  PRN: albuterol, labetalol Anti-infectives (From admission, onward)    None      Assessment: 70 yom with a history of atrial fibrillation on Coumadin, prior CVA, HTN, CHF with preserved ejection fraction. Patient presenting with hypoxia and SOB. Warfarin consult for AF placed.  Patient taking warfarin PTA for AF. Last anticoagulation note states taking 7.5 mg (5 mg x 1.5) every Mon; 5 mg (5 mg x 1) all other days. Patient is unable to confirm dosing at this time. Patient's son in room is unsure of dose but states that patient took his warfarin in the morning (normally takes in the morning).  PT / INR 18.8 / 1.6 Hgb 13.9; plt clumped  Goal of Therapy:  INR 2-3 Monitor platelets by anticoagulation protocol: Yes   Plan:  Will give additional 2.5 mg dose this evening - subsequent dosing per INR Monitor for s/s of bleeding and DDIs  Lorelei Pont, PharmD, BCPS 12/17/2020 5:53 PM ED Clinical Pharmacist -  [phone]

## 2020-12-11 NOTE — ED Notes (Signed)
Pt alert, NAD, calm, interactive, resps e/u, and shallow speaking in clear phrases, no obvious dyspnea. RT at [facility]. Inhaler given. Pt denies sob, pain, nausea, HA or other sx. Family at [facility].

## 2020-12-11 NOTE — ED Notes (Signed)
RT at [facility] placing pt on bipap non-emergently

## 2020-12-11 NOTE — ED Notes (Signed)
Patient report given to [facility] RT, [place]. Patient remains at [place] at this time awaiting transport.

## 2020-12-11 NOTE — ED Triage Notes (Addendum)
Pt presents to ED POV. Pt c/o hypoxia at home. Per son pt's O2 sat 70% at home. Per son pt's breathing sounded different this morning so he gave him a few puffs of his inhaler. Son reports that pt's ankles are slightly more swollen. Takes home meds compliantly

## 2020-12-12 ENCOUNTER — Inpatient Hospital Stay (HOSPITAL_COMMUNITY): Payer: Medicare Other

## 2020-12-12 DIAGNOSIS — J9601 Acute respiratory failure with hypoxia: Secondary | ICD-10-CM | POA: Diagnosis not present

## 2020-12-12 DIAGNOSIS — I5030 Unspecified diastolic (congestive) heart failure: Secondary | ICD-10-CM | POA: Diagnosis not present

## 2020-12-12 DIAGNOSIS — I639 Cerebral infarction, unspecified: Secondary | ICD-10-CM

## 2020-12-12 LAB — BASIC METABOLIC PANEL
Anion gap: 9 (ref 5–15)
BUN: 12 mg/dL (ref 8–23)
CO2: 33 mmol/L — ABNORMAL HIGH (ref 22–32)
Calcium: 9 mg/dL (ref 8.9–10.3)
Chloride: 95 mmol/L — ABNORMAL LOW (ref 98–111)
Creatinine, Ser: 1.17 mg/dL (ref 0.61–1.24)
GFR, Estimated: 60 mL/min — ABNORMAL LOW (ref >60)
Glucose, Bld: 87 mg/dL (ref 70–99)
Potassium: 3.8 mmol/L (ref 3.5–5.1)
Sodium: 137 mmol/L (ref 135–145)

## 2020-12-12 LAB — BLOOD GAS, ARTERIAL
Acid-Base Excess: 9.3 mmol/L — ABNORMAL HIGH (ref 0.0–2.0)
Acid-Base Excess: 9.5 mmol/L — ABNORMAL HIGH (ref 0.0–2.0)
Bicarbonate: 35.5 mmol/L — ABNORMAL HIGH (ref 20.0–28.0)
Bicarbonate: 34.3 mmol/L — ABNORMAL HIGH (ref 20.0–28.0)
Drawn by: 31394
FIO2: 60
FIO2: 40
O2 Saturation: 99.2 %
O2 Saturation: 97.9 %
Patient temperature: 36.8
Patient temperature: 37
pCO2 arterial: 70.7 mmHg (ref 32.0–48.0)
pCO2 arterial: 54.8 mmHg — ABNORMAL HIGH (ref 32.0–48.0)
pH, Arterial: 7.32 — ABNORMAL LOW (ref 7.350–7.450)
pH, Arterial: 7.413 (ref 7.350–7.450)
pO2, Arterial: 203 mmHg — ABNORMAL HIGH (ref 83.0–108.0)
pO2, Arterial: 97.5 mmHg (ref 83.0–108.0)

## 2020-12-12 LAB — COMPREHENSIVE METABOLIC PANEL
ALT: 19 U/L (ref 0–44)
AST: 27 U/L (ref 15–41)
Albumin: 3.2 g/dL — ABNORMAL LOW (ref 3.5–5.0)
Alkaline Phosphatase: 58 U/L (ref 38–126)
Anion gap: 7 (ref 5–15)
BUN: 12 mg/dL (ref 8–23)
CO2: 36 mmol/L — ABNORMAL HIGH (ref 22–32)
Calcium: 8.9 mg/dL (ref 8.9–10.3)
Chloride: 93 mmol/L — ABNORMAL LOW (ref 98–111)
Creatinine, Ser: 1.03 mg/dL (ref 0.61–1.24)
GFR, Estimated: >60 mL/min (ref >60)
Glucose, Bld: 113 mg/dL — ABNORMAL HIGH (ref 70–99)
Potassium: 3.5 mmol/L (ref 3.5–5.1)
Sodium: 136 mmol/L (ref 135–145)
Total Bilirubin: 0.9 mg/dL (ref 0.3–1.2)
Total Protein: 7.9 g/dL (ref 6.5–8.1)

## 2020-12-12 LAB — PROTIME-INR
INR: 1.6 — ABNORMAL HIGH (ref 0.8–1.2)
Prothrombin Time: 19.1 seconds — ABNORMAL HIGH (ref 11.4–15.2)

## 2020-12-12 LAB — CBC
HCT: 42.3 % (ref 39.0–52.0)
Hemoglobin: 13.6 g/dL (ref 13.0–17.0)
MCH: 28.5 pg (ref 26.0–34.0)
MCHC: 32.2 g/dL (ref 30.0–36.0)
MCV: 88.7 fL (ref 80.0–100.0)
Platelets: UNDETERMINED 10*3/uL (ref 150–400)
RBC: 4.77 MIL/uL (ref 4.22–5.81)
RDW: 14.4 % (ref 11.5–15.5)
WBC: 6.9 10*3/uL (ref 4.0–10.5)
nRBC: 0 % (ref 0.0–0.2)

## 2020-12-12 LAB — MAGNESIUM: Magnesium: 2 mg/dL (ref 1.7–2.4)

## 2020-12-12 MED ORDER — ATORVASTATIN CALCIUM 10 MG PO TABS
20.0000 mg | ORAL_TABLET | Freq: Every day | ORAL | Status: DC
  Administered 2020-12-12 – 2020-12-18 (×7): 20 mg via ORAL
  Filled 2020-12-12 (×7): qty 2

## 2020-12-12 MED ORDER — METOPROLOL TARTRATE 5 MG/5ML IV SOLN
2.5000 mg | Freq: Four times a day (QID) | INTRAVENOUS | Status: DC | PRN
  Administered 2020-12-12 – 2020-12-15 (×5): 2.5 mg via INTRAVENOUS
  Filled 2020-12-12 (×6): qty 5

## 2020-12-12 MED ORDER — LORAZEPAM 2 MG/ML IJ SOLN
0.5000 mg | Freq: Once | INTRAMUSCULAR | Status: AC
  Administered 2020-12-12: 0.5 mg via INTRAVENOUS
  Filled 2020-12-12: qty 1

## 2020-12-12 MED ORDER — WARFARIN SODIUM 7.5 MG PO TABS
7.5000 mg | ORAL_TABLET | Freq: Once | ORAL | Status: AC
  Administered 2020-12-12: 7.5 mg via ORAL
  Filled 2020-12-12: qty 1

## 2020-12-12 NOTE — Plan of Care (Signed)
  Problem: Clinical Measurements: Goal: Will remain free from infection Outcome: Progressing Goal: Diagnostic test results will improve Outcome: Progressing   Problem: Education: Goal: Knowledge of General Education information will improve Description: Including pain rating scale, medication(s)/side effects and non-pharmacologic comfort measures Outcome: Not Progressing

## 2020-12-12 NOTE — Progress Notes (Signed)
PT Cancellation Note  Patient Details Name: [name] MRN: [id number] DOB: 1930/01/23   Cancelled Treatment:    Reason Eval/Treat Not Completed: Medical issues which prohibited therapy as pt remains on Bipap today. Will continue to follow and evaluate as appropriate.   West Carbo, PT, DPT   Acute Rehabilitation Department Pager #: [phone]   Sandra Cockayne 12/12/2020, 2:05 PM

## 2020-12-12 NOTE — Progress Notes (Signed)
Pt is off BIPAP and on Oxygen via [place] this time @6L  and tolerating well. Pt is alert to self and can answer the questions being asked. Denies pain. Metoprolol IV given once for HR more than 110. Son is in bed side and updated. Will continue to monitor the patient  Palma Holter, RN

## 2020-12-12 NOTE — Progress Notes (Signed)
OT Cancellation Note  Patient Details Name: [name] MRN: [id number] DOB: 1930/07/23   Cancelled Treatment:    Reason Eval/Treat Not Completed: Patient not medically ready (Pt remains on bipap. Will follow.)  Malka So 12/12/2020, 1:46 PM Nestor Lewandowsky, OTR/L Acute Rehabilitation Services Pager: [phone] Office: [phone]

## 2020-12-12 NOTE — Progress Notes (Signed)
VASCULAR LAB    Carotid duplex has been performed.  See CV proc for preliminary results.   Gianpaolo Mindel, RVT 12/12/2020, 4:35 PM

## 2020-12-12 NOTE — Progress Notes (Signed)
Took patient off the BiPAP at this time and placed on a 6 Lpm [place] per MD order. Patient tolerating well at this time.

## 2020-12-12 NOTE — Progress Notes (Signed)
PROGRESS NOTE    TEE RICHESON  OIN:[id number] DOB: 08-04-1930 DOA: 12/27/2020 PCP: Gaynelle Arabian, MD    Brief Narrative:  87 years old male with past medical history of CHF, persistent atrial fibrillation, hypertension, history of CVA, thrombocytopenia, came with a chief complaint of shortness of breath.  The symptoms started 1 week ago. His saturation dropped at home in the 70s. In the emergency room he was found to be in congestive heart failure, with a BNP elevated to 31 troponin elevated at 41 trended down to 38 on repeat Respiratory panel negative chest x-ray showed edema Patient received Lasix IV 80 mg, DuoNebs, metoprolol, INR subtherapeutic 1.6. Patient used to take Lasix 40 mg daily BUN and creatinine were normal    Assessment & Plan:  CHF exacerbation with preserved ejection fraction Last echocardiogram in October of this year ejection fraction 60 to 65% with inability to evaluate diastolic function Mild elevated pulmonary artery pressure as well as severely dilated left atrium Continue with BiPAP Continue with Lasix 80 mg twice daily will be changed tomorrow when he is out of BiPAP Will try nasal cannula during the day and BiPAP during the night Check potassium level was low in the emergency room Daily weights and strict intake output BUN and creatinine normal Urinary output 2700 Cardiology consult Speech to evaluate at bedside  Persistent atrial fibrillation Rate controlled Metoprolol IV 2.5 mg every 6 hours as needed for heart rate more than 110 Because the patient is n.p.o. Resume p.o. 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   Consultants:  Cardiology Dr. Johney Frame  Procedures:    Antimicrobials:    Subjective: Patient with shortness of breath on BiPAP Follow minimal commands open his eyes   Objective: Vitals:   12/12/20 1000 12/12/20 1113 12/12/20 1119 12/12/20 1145  BP: 110/74 118/85  118/85  Pulse: 94 91 99 (!) 102  Resp: 18 18 16 17   Temp:  98.2 F (36.8 C)    TempSrc:  Axillary    SpO2: 93% 96% 96% 95%  Weight:      Height:        Intake/Output Summary (Last 24 hours) at 12/12/2020 1329 Last data filed at 12/12/2020 1100 Gross per 24 hour  Intake 316.46 ml  Output 3100 ml  Net -2783.54 ml   Filed Weights   12/25/2020 2111 12/12/20 0545  Weight: 91.5 kg 92.5 kg    Examination:  General exam: Appears calm and comfortable  Respiratory system: Clear to auscultation. Respiratory effort normal. Cardiovascular system: S1 & S2 heard, irregular no JVD, murmurs, rubs, gallops or clicks. +++ pedal edema. Gastrointestinal system: Abdomen is nondistended, soft and nontender. No organomegaly or masses felt. Normal bowel sounds heard. Central nervous system: Open his eyes to verbal commands not able to do follow other commands Extremities: Symmetric 5 x 5 power. Skin: No rashes, lesions or ulcers Psychiatry: Unable to evaluate t  Data Reviewed: I have personally reviewed following labs and imaging studies  CBC: Recent Labs  Lab 01/05/2021 1400 12/12/20 0053  WBC 7.9 6.9  NEUTROABS 6.0  --   HGB 13.9 13.6  HCT 43.4 42.3  MCV 87.7 88.7  PLT PLATELET CLUMPS NOTED ON SMEAR, UNABLE TO ESTIMATE PLATELET CLUMPS NOTED ON SMEAR, UNABLE TO ESTIMATE   Basic Metabolic Panel: Recent Labs  Lab 12/30/2020 1400 12/20/2020 1550  12/18/2020 2123 12/12/20 0053  NA 135  --  135 136  K 3.2*  --  3.1* 3.5  CL 93*  --  93* 93*  CO2 34*  --  34* 36*  GLUCOSE 129*  --  115* 113*  BUN 14  --  11 12  CREATININE 0.96  --  1.05 1.03  CALCIUM 9.9  --  8.7* 8.9  MG  --  1.9  --   --    GFR: Estimated Creatinine Clearance: 56 mL/min (by C-G formula based on SCr of 1.03 mg/dL). Liver Function Tests: Recent Labs  Lab 12/21/2020 1400 12/12/20 0053  AST 23 27  ALT 16 19  ALKPHOS 55 58  BILITOT 0.8 0.9  PROT 8.6* 7.9  ALBUMIN 4.0 3.2*   No results for input(s): LIPASE, AMYLASE in the last 168 hours. No results for input(s): AMMONIA in the last 168 hours. Coagulation Profile: Recent Labs  Lab 12/21/2020 1400 12/12/20 0053  INR 1.6* 1.6*   Cardiac Enzymes: No results for input(s): CKTOTAL, CKMB, CKMBINDEX, TROPONINI in the last 168 hours. BNP (last 3 results) No results for input(s): PROBNP in the last 8760 hours. HbA1C: No results for input(s): HGBA1C in the last 72 hours. CBG: No results for input(s): GLUCAP in the last 168 hours. Lipid Profile: No results for input(s): CHOL, HDL, LDLCALC, TRIG, CHOLHDL, LDLDIRECT in the last 72 hours. Thyroid Function Tests: No results for input(s): TSH, T4TOTAL, FREET4, T3FREE, THYROIDAB in the last 72 hours. Anemia Panel: No results for input(s): VITAMINB12, FOLATE, FERRITIN, TIBC, IRON, RETICCTPCT in the last 72 hours. Sepsis Labs: Recent Labs  Lab 12/12/2020 1400 12/22/2020 1550  LATICACIDVEN 1.3 1.4    Recent Results (from the past 240 hour(s))  Resp Panel by RT-PCR (Flu A&B, Covid) Nasopharyngeal Swab     Status: None   Collection Time: 12/30/2020  2:00 PM   Specimen: Nasopharyngeal Swab; Nasopharyngeal(NP) swabs in vial transport medium  Result Value Ref Range Status   SARS Coronavirus 2 by RT PCR NEGATIVE NEGATIVE Final    Comment: (NOTE) SARS-CoV-2 target nucleic acids are NOT DETECTED.  The SARS-CoV-2 RNA is generally detectable in upper respiratory specimens  during the acute phase of infection. The lowest concentration of SARS-CoV-2 viral copies this assay can detect is 138 copies/mL. A negative result does not preclude SARS-Cov-2 infection and should not be used as the sole basis for treatment or other patient management decisions. A negative result may occur with  improper specimen collection/handling, submission of specimen other than nasopharyngeal swab, presence of viral mutation(s) within the areas targeted by this assay, and inadequate number of viral copies(<138 copies/mL). A negative result must be combined with clinical observations, patient history, and epidemiological information. The expected result is Negative.  Fact Sheet for Patients:  EntrepreneurPulse.com.au  Fact Sheet for Healthcare Providers:  IncredibleEmployment.be  This test is no t yet approved or cleared by the [place] FDA and  has been authorized for detection and/or diagnosis of SARS-CoV-2 by FDA under an Emergency Use Authorization (EUA). This EUA will remain  in effect (meaning this test can be used) for the duration of the COVID-19 declaration under Section 564(b)(1) of the Act, 21 U.S.C.section 360bbb-3(b)(1), unless the authorization is terminated  or revoked sooner.       Influenza A by PCR NEGATIVE NEGATIVE Final   Influenza B by PCR NEGATIVE NEGATIVE Final    Comment: (NOTE) The Xpert Xpress SARS-CoV-2/FLU/RSV plus assay is intended as an aid in the diagnosis of influenza from Nasopharyngeal swab specimens and should not be used as a sole basis for treatment. Nasal washings and aspirates are unacceptable for Xpert Xpress SARS-CoV-2/FLU/RSV testing.  Fact Sheet for Patients: EntrepreneurPulse.com.au  Fact Sheet for Healthcare Providers: IncredibleEmployment.be  This test is not yet approved or cleared by the [place] FDA and has been authorized for detection  and/or diagnosis of SARS-CoV-2 by FDA under an Emergency Use Authorization (EUA). This EUA will remain in effect (meaning this test can be used) for the duration of the COVID-19 declaration under Section 564(b)(1) of the Act, 21 U.S.C. section 360bbb-3(b)(1), unless the authorization is terminated or revoked.  Performed at [facility], [address], [place], [place] [address]          Radiology Studies: [facility] Chest [facility] 1 View  Result Date: 12/12/2020 CLINICAL DATA:  Hypoxia/CHF. EXAM: PORTABLE CHEST 1 VIEW COMPARISON:  Chest x-rays dated 01/08/2021, 10/18/2020 and 10/12/2009. FINDINGS: Heart size and mediastinal contours are stable. Stable bilateral interstitial prominence. Increased opacity within the RIGHT upper lung. Probable mild bibasilar atelectasis. Chronic elevation of the RIGHT hemidiaphragm. Osseous structures about the chest are unremarkable. IMPRESSION: 1. Increased opacity within the RIGHT upper lung, suspicious for pneumonia, alternatively asymmetric edema or atelectasis. 2. Bilateral interstitial prominence, suggesting mild edema versus chronic interstitial lung disease. 3. Probable mild bibasilar atelectasis. Electronically Signed   By: Franki Cabot M.D.   On: 12/12/2020 08:39   DG Chest Portable 1 View  Result Date: 12/25/2020 CLINICAL DATA:  Shortness of breath. EXAM: PORTABLE CHEST 1 VIEW COMPARISON:  10/18/2020 FINDINGS: Low volume, AP portable radiograph. Patient rotated left. Cardiomegaly accentuated by AP portable technique. Atherosclerosis in the transverse aorta. Moderate right hemidiaphragm elevation. No large pleural effusion. No pneumothorax. Pulmonary interstitial prominence, accentuated by AP portable technique and low lung volumes. Possible left greater than right base airspace disease. IMPRESSION: Multifocal degradation, including patient positioning and AP portable technique. Cardiomegaly with suspicion of mild interstitial edema. Cannot  exclude bibasilar airspace disease. Consider PA and lateral radiographs if feasible. Aortic Atherosclerosis (ICD10-I70.0). Electronically Signed   By: Abigail Miyamoto M.D.   On: 01/05/2021 14:08        Scheduled Meds:  aspirin EC  81 mg Oral QHS   furosemide  80 mg Intravenous BID   sodium chloride flush  3 mL Intravenous Q12H   warfarin  7.5 mg Oral ONCE-1600   Warfarin - Pharmacist Dosing Inpatient   Does not apply q1600   Continuous Infusions:   LOS: 1 day    Time spent: More than 35 minutes    Assunta Found, MD [facility]    12/12/2020, 1:29 PM

## 2020-12-12 NOTE — Progress Notes (Signed)
[place] for Warfarin Indication: atrial fibrillation  No Known Allergies  Patient Measurements: Height: 6\' 1"  (185.4 cm) Weight: 92.5 kg (203 lb 14.8 oz) IBW/kg (Calculated) : 79.9  Vital Signs: Temp: 98.2 F (36.8 C) (12/03 0323) Temp Source: Axillary (12/03 0323) BP: 129/84 (12/03 0323) Pulse Rate: 92 (12/03 0345)  Labs: Recent Labs    12/20/2020 1400 12/10/2020 1550 01/01/2021 2123 12/12/20 0053  HGB 13.9  --   --  13.6  HCT 43.4  --   --  42.3  PLT PLATELET CLUMPS NOTED ON SMEAR, UNABLE TO ESTIMATE  --   --  PLATELET CLUMPS NOTED ON SMEAR, UNABLE TO ESTIMATE  LABPROT 18.8*  --   --  19.1*  INR 1.6*  --   --  1.6*  CREATININE 0.96  --  1.05 1.03  TROPONINIHS 41* 38*  --   --      Estimated Creatinine Clearance: 56 mL/min (by C-G formula based on SCr of 1.03 mg/dL).   Medications:  Medications Prior to Admission  Medication Sig Dispense Refill Last Dose   acetaminophen (TYLENOL) 500 MG tablet Take 500-1,000 mg by mouth every 6 (six) hours as needed (FOR PAIN.).      albuterol (VENTOLIN HFA) 108 (90 Base) MCG/ACT inhaler Inhale 1 puff into the lungs every 4 (four) hours as needed for wheezing or shortness of breath.      aspirin EC 81 MG tablet Take 81 mg by mouth at bedtime.      calcium carbonate (OS-CAL) 600 MG TABS tablet Take 600 mg by mouth daily.       furosemide (LASIX) 40 MG tablet Take 1 tablet (40 mg total) by mouth daily. 30 tablet 0    Glucosamine HCl (GLUCOSAMINE PO) Take 1,500 mg by mouth daily.       metoprolol succinate (TOPROL-XL) 100 MG 24 hr tablet Take 1 tablet (100 mg total) by mouth daily. Take with or immediately following a meal. 30 tablet 0    Multiple Vitamin (MULTIVITAMIN) tablet Take 1 tablet by mouth daily.      Multiple Vitamins-Minerals (PRESERVISION AREDS 2) CAPS Take 1 capsule by mouth 2 (two) times daily.      traMADol (ULTRAM) 50 MG tablet Take 1 tablet (50 mg total) by mouth every 6 (six) hours  as needed. 10 tablet 0    warfarin (COUMADIN) 1 MG tablet Take as directed by the coumadin dept      warfarin (COUMADIN) 3 MG tablet Take as directed by coumadin dept      warfarin (COUMADIN) 5 MG tablet TAKE AS DIRECTED PER ANTICOAGULATION CLINIC (Patient taking differently: Take 5-7.5 mg by mouth See admin instructions. 7.5 mg on Sunday 5 mg Monday,Tuesday,Wednesday,Thursday,Friday,saturday) 100 tablet 0    Scheduled:   aspirin EC  81 mg Oral QHS   furosemide  80 mg Intravenous BID   metoprolol succinate  100 mg Oral Daily   sodium chloride flush  3 mL Intravenous Q12H   warfarin  2.5 mg Oral Once   Warfarin - Pharmacist Dosing Inpatient   Does not apply q1600   Infusions:  PRN: acetaminophen **OR** acetaminophen, albuterol, polyethylene glycol Anti-infectives (From admission, onward)    None      Assessment: 85 yoM with hx AFib on warfarin at home admitted with acute HFpEF. INR subtherapeutic on admit. Home warfarin dose is 5mg  daily except 7.5mg  Mondays. Pltc unable to be analyzed due to clumping (pt has this historically).  Warfarin not given  overnight, pt reported taking dose in the am before admission. INR this morning remains subtherapeutic at 1.6.   Goal of Therapy:  INR 2-3 Monitor platelets by anticoagulation protocol: Yes   Plan:  Warfarin 7.5mg  PO x1 tonight Daily protime  Arrie Senate, PharmD, BCPS, [facility] Clinical Pharmacist [phone] Please check AMION for all [facility] numbers 12/12/2020

## 2020-12-12 NOTE — Progress Notes (Signed)
RT made me aware that Pt pCO2 came back with a critical value of 70.7. pH was 7.32 and po2 was 203. Made MD aware. RT increased bipap to 16 an decreased FiO2 to 40. Will continue to monitor.

## 2020-12-12 NOTE — Progress Notes (Signed)
ABG collected. Post results RT made changes to bipap settings and notified RN. RN was notifying MD about ABG and changes.

## 2020-12-12 NOTE — Consult Note (Signed)
Cardiology Consultation:   Patient ID: [name] MRN: [id number]; DOB: 02-07-1930  Admit date: 12/25/2020 Date of Consult: 12/12/2020  PCP:  Danny Arabian, MD   [facility] Providers Cardiologist:  Danny Grooms, MD     Patient Profile:   [name] is a 87 y.o. male with a hx of permanent atrial fibrillation, CVA, hypertension, hyperlipidemia, BPH, memory loss who is being seen 12/12/2020 for the evaluation of CHF at the request of Dr. Criss Alvine.  History of Present Illness:   [name] 87 year old male with past medical history noted above.  Has been followed by Dr. Tamala Julian as an outpatient.  Has longstanding history of permanent atrial fibrillation initially diagnosed 04/2006 when he suffered a left posterior cerebral artery embolic infarct.  He was managed with metoprolol and Coumadin.  Underwent cardiac catheterization in 2008 which showed no significant obstructive CAD, minimal plaque, normal LVEF.  Echocardiogram with EF of 65% and mild aortic regurgitation.  Was seen 10/2020 for an unwitnessed fall, hematochezia and altered mental status.  Cardiology was consulted in regards to elevated BNP with concern for heart failure.  Echocardiogram showed LVEF of 60 to 65%, no wall motion abnormality, mild LVH, normal DD, severely dilated left atrium, mild MR. It was felt there was not clear evidence of heart failure and recommended stopping diuretics.  He was seen back in the office on 11/10/2020 with [name] for follow-up.  Heart rate was noted to be elevated at this visit and he was instructed to increase his metoprolol to 150 mg daily.  On furosemide 40 mg daily.   Resented to the ED on 12/2 with complaints of worsening shortness of breath.  Son lives nearby and alternates with family member staying overnight with the patient.  Notes that the patient had become progressively short of breath with orthopnea and PND.  Attempted to check his O2 saturations and noted they were in  the 70s.  EMS was called. Son reports compliance with lasix at home.   Labs on admission showed sodium 135, potassium 3.2, CO2 34, creatinine 0.96, BNP 231, high-sensitivity troponin 41>> 38, lactic acid 1.3, WBC 7.9, hemoglobin 13.9.  EKG atrial fibrillation, 91 bpm.  Chest x-ray showed multifocal degradation, questionable bibasilar airspace disease.  Repeat chest x-ray increased opacity in the right upper lung suspicious for pneumonia, mild edema versus chronic interstitial lung disease.  He was admitted to internal medicine weaned from BiPAP to nasal cannula.  Started on 80 of Lasix twice daily, continued on Coumadin per pharmacy.   Past Medical History:  Diagnosis Date   Atrial fibrillation ([place]) 10/15/2012   Cancer ([place])    Skin (R hand)   Embolic stroke ([place]) 29/56/2130   Essential hypertension 01/21/2014   Long term current use of anticoagulant therapy 12/25/2012    Past Surgical History:  Procedure Laterality Date   APPENDECTOMY     CARDIAC CATHETERIZATION     CYSTOSCOPY WITH INSERTION OF UROLIFT N/A 10/10/2017   Procedure: CYSTOSCOPY WITH INSERTION OF UROLIFT;  Surgeon: [name], MD;  Location: [facility] ORS;  Service: Urology;  Laterality: N/A;  NEEDS 60 MIN   EYE SURGERY Bilateral 2013   Cataracts     Home Medications:  Prior to Admission medications   Medication Sig Start Date End Date Taking? Authorizing Provider  acetaminophen (TYLENOL) 500 MG tablet Take 500-1,000 mg by mouth every 6 (six) hours as needed (FOR PAIN.).    [provider]  albuterol (VENTOLIN HFA) 108 (90 Base) MCG/ACT  inhaler Inhale 1 puff into the lungs every 4 (four) hours as needed for wheezing or shortness of breath. 07/02/20   [provider]  aspirin EC 81 MG tablet Take 81 mg by mouth at bedtime.    [provider]  calcium carbonate (OS-CAL) 600 MG TABS tablet Take 600 mg by mouth daily.     [provider]  furosemide (LASIX) 40 MG tablet Take 1 tablet (40 mg  total) by mouth daily. 10/23/20   Little Ishikawa, MD  Glucosamine HCl (GLUCOSAMINE PO) Take 1,500 mg by mouth daily.     [provider]  metoprolol succinate (TOPROL-XL) 100 MG 24 hr tablet Take 1 tablet (100 mg total) by mouth daily. Take with or immediately following a meal. 10/23/20   Little Ishikawa, MD  Multiple Vitamin (MULTIVITAMIN) tablet Take 1 tablet by mouth daily.    [provider]  Multiple Vitamins-Minerals (PRESERVISION AREDS 2) CAPS Take 1 capsule by mouth 2 (two) times daily.    [provider]  traMADol (ULTRAM) 50 MG tablet Take 1 tablet (50 mg total) by mouth every 6 (six) hours as needed. 10/22/20   Little Ishikawa, MD  warfarin (COUMADIN) 1 MG tablet Take as directed by the coumadin dept 11/05/20   [provider]  warfarin (COUMADIN) 3 MG tablet Take as directed by coumadin dept 11/04/20   [provider]  warfarin (COUMADIN) 5 MG tablet TAKE AS DIRECTED PER ANTICOAGULATION CLINIC Patient taking differently: Take 5-7.5 mg by mouth See admin instructions. 7.5 mg on Sunday 5 mg Monday,Tuesday,Wednesday,Thursday,Friday,saturday 09/15/20   Leanor Kail, PA    Inpatient Medications: Scheduled Meds:  aspirin EC  81 mg Oral QHS   furosemide  80 mg Intravenous BID   sodium chloride flush  3 mL Intravenous Q12H   warfarin  7.5 mg Oral ONCE-1600   Warfarin - Pharmacist Dosing Inpatient   Does not apply q1600   Continuous Infusions:  PRN Meds: acetaminophen **OR** acetaminophen, albuterol, metoprolol tartrate, polyethylene glycol  Allergies:   No Known Allergies  Social History:   Social History   Socioeconomic History   Marital status: Married    Spouse name: Not on file   Number of children: Not on file   Years of education: Not on file   Highest education level: Not on file  Occupational History   Not on file  Tobacco Use   Smoking status: Former    Packs/day: 0.25    Years: 10.00    Pack  years: 2.50    Types: Cigarettes   Smokeless tobacco: Never  Vaping Use   Vaping Use: Never used  Substance and Sexual Activity   Alcohol use: No    Alcohol/week: 0.0 standard drinks   Drug use: No   Sexual activity: Not on file  Other Topics Concern   Not on file  Social History Narrative   Not on file   Social Determinants of Health   Financial Resource Strain: Not on file  Food Insecurity: Not on file  Transportation Needs: Not on file  Physical Activity: Not on file  Stress: Not on file  Social Connections: Not on file  Intimate Partner Violence: Not on file    Family History:    Family History  Problem Relation Age of Onset   Other Mother        Brain hemmorrhage   Other Father        unknown     ROS:  Please see the  history of present illness.   All other ROS reviewed and negative.     Physical Exam/Data:   Vitals:   12/12/20 1000 12/12/20 1113 12/12/20 1119 12/12/20 1145  BP: 110/74 118/85  118/85  Pulse: 94 91 99 (!) 102  Resp: 18 18 16 17   Temp:  98.2 F (36.8 C)    TempSrc:  Axillary    SpO2: 93% 96% 96% 95%  Weight:      Height:        Intake/Output Summary (Last 24 hours) at 12/12/2020 1408 Last data filed at 12/12/2020 1100 Gross per 24 hour  Intake 316.46 ml  Output 3100 ml  Net -2783.54 ml   Last 3 Weights 12/12/2020 12/15/2020 11/10/2020  Weight (lbs) 203 lb 14.8 oz 201 lb 11.5 oz (No Data)  Weight (kg) 92.5 kg 91.5 kg (No Data)     Body mass index is 26.9 kg/m.  General: Frail older male, nasal cannula HEENT: normal Neck: no JVD Vascular: No carotid bruits; Distal pulses 2+ bilaterally Cardiac:  normal S1, S2; irregularly irregular; no murmur  Lungs: Rales, wheezing bilaterally Abd: soft, nontender, no hepatomegaly  Ext: 1+ bilateral lower extremity edema Musculoskeletal:  No deformities, BUE and BLE strength normal and equal Skin: warm and dry  Neuro:  CNs 2-12 intact, no focal abnormalities noted Psych:  Normal affect    EKG:  The EKG was personally reviewed and demonstrates: Atrial fibrillation, 91 bpm Telemetry:  Telemetry was personally reviewed and demonstrates: Atrial fibrillation rates 80-120 bpm  Relevant CV Studies:  Echo: 10/15/2020  IMPRESSIONS     1. Left ventricular ejection fraction, by estimation, is 60 to 65%. The  left ventricle has normal function. The left ventricle has no regional  wall motion abnormalities. There is mild left ventricular hypertrophy.  Left ventricular diastolic function  could not be evaluated.   2. Right ventricular systolic function is normal. The right ventricular  size is normal. There is mildly elevated pulmonary artery systolic  pressure.   3. Left atrial size was severely dilated.   4. The mitral valve is grossly normal. Mild mitral valve regurgitation.   5. The aortic valve is tricuspid. There is mild calcification of the  aortic valve. There is mild thickening of the aortic valve. Aortic valve  regurgitation is not visualized.   FINDINGS   Left Ventricle: Left ventricular ejection fraction, by estimation, is 60  to 65%. The left ventricle has normal function. The left ventricle has no  regional wall motion abnormalities. The left ventricular internal cavity  size was normal in size. There is   mild left ventricular hypertrophy. Left ventricular diastolic function  could not be evaluated due to atrial fibrillation. Left ventricular  diastolic function could not be evaluated.   Right Ventricle: The right ventricular size is normal. Right vetricular  wall thickness was not well visualized. Right ventricular systolic  function is normal. There is mildly elevated pulmonary artery systolic  pressure. The tricuspid regurgitant velocity   is 2.93 m/s, and with an assumed right atrial pressure of 3 mmHg, the  estimated right ventricular systolic pressure is 12.7 mmHg.   Left Atrium: Left atrial size was severely dilated.   Right Atrium: Right atrial  size was normal in size.   Pericardium: There is no evidence of pericardial effusion.   Mitral Valve: The mitral valve is grossly normal. There is mild thickening  of the mitral valve leaflet(s). There is mild calcification of the mitral  valve leaflet(s). Mild to moderate  mitral annular calcification. Mild  mitral valve regurgitation.   Tricuspid Valve: The tricuspid valve is grossly normal. Tricuspid valve  regurgitation is mild.   Aortic Valve: The aortic valve is tricuspid. There is mild calcification  of the aortic valve. There is mild thickening of the aortic valve. Aortic  valve regurgitation is not visualized. Aortic valve mean gradient measures  4.0 mmHg. Aortic valve peak  gradient measures 7.4 mmHg. Aortic valve area, by VTI measures 2.96 cm.   Pulmonic Valve: The pulmonic valve was grossly normal. Pulmonic valve  regurgitation is not visualized.   Aorta: The aortic root and ascending aorta are structurally normal, with  no evidence of dilitation.   IAS/Shunts: The atrial septum is grossly normal.   Laboratory Data:  High Sensitivity Troponin:   Recent Labs  Lab 12/24/2020 1400 12/24/2020 1550  TROPONINIHS 41* 38*     Chemistry Recent Labs  Lab 12/23/2020 1400 12/12/2020 1550 01/07/2021 2123 12/12/20 0053  NA 135  --  135 136  K 3.2*  --  3.1* 3.5  CL 93*  --  93* 93*  CO2 34*  --  34* 36*  GLUCOSE 129*  --  115* 113*  BUN 14  --  11 12  CREATININE 0.96  --  1.05 1.03  CALCIUM 9.9  --  8.7* 8.9  MG  --  1.9  --   --   GFRNONAA >60  --  >60 >60  ANIONGAP 8  --  8 7    Recent Labs  Lab 12/17/2020 1400 12/12/20 0053  PROT 8.6* 7.9  ALBUMIN 4.0 3.2*  AST 23 27  ALT 16 19  ALKPHOS 55 58  BILITOT 0.8 0.9   Lipids No results for input(s): CHOL, TRIG, HDL, LABVLDL, LDLCALC, CHOLHDL in the last 168 hours.  Hematology Recent Labs  Lab 12/21/2020 1400 12/12/20 0053  WBC 7.9 6.9  RBC 4.95 4.77  HGB 13.9 13.6  HCT 43.4 42.3  MCV 87.7 88.7  MCH 28.1 28.5   MCHC 32.0 32.2  RDW 14.7 14.4  PLT PLATELET CLUMPS NOTED ON SMEAR, UNABLE TO ESTIMATE PLATELET CLUMPS NOTED ON SMEAR, UNABLE TO ESTIMATE   Thyroid No results for input(s): TSH, FREET4 in the last 168 hours.  BNP Recent Labs  Lab 12/27/2020 1400  BNP 231.3*    DDimer No results for input(s): DDIMER in the last 168 hours.   Radiology/Studies:  DG Chest Port 1 View  Result Date: 12/12/2020 CLINICAL DATA:  Hypoxia/CHF. EXAM: PORTABLE CHEST 1 VIEW COMPARISON:  Chest x-rays dated 12/10/2020, 10/18/2020 and 10/12/2009. FINDINGS: Heart size and mediastinal contours are stable. Stable bilateral interstitial prominence. Increased opacity within the RIGHT upper lung. Probable mild bibasilar atelectasis. Chronic elevation of the RIGHT hemidiaphragm. Osseous structures about the chest are unremarkable. IMPRESSION: 1. Increased opacity within the RIGHT upper lung, suspicious for pneumonia, alternatively asymmetric edema or atelectasis. 2. Bilateral interstitial prominence, suggesting mild edema versus chronic interstitial lung disease. 3. Probable mild bibasilar atelectasis. Electronically Signed   By: Franki Cabot M.D.   On: 12/12/2020 08:39   DG Chest Portable 1 View  Result Date: 12/25/2020 CLINICAL DATA:  Shortness of breath. EXAM: PORTABLE CHEST 1 VIEW COMPARISON:  10/18/2020 FINDINGS: Low volume, AP portable radiograph. Patient rotated left. Cardiomegaly accentuated by AP portable technique. Atherosclerosis in the transverse aorta. Moderate right hemidiaphragm elevation. No large pleural effusion. No pneumothorax. Pulmonary interstitial prominence, accentuated by AP portable technique and low lung volumes. Possible left greater than right base airspace disease. IMPRESSION:  Multifocal degradation, including patient positioning and AP portable technique. Cardiomegaly with suspicion of mild interstitial edema. Cannot exclude bibasilar airspace disease. Consider PA and lateral radiographs if feasible.  Aortic Atherosclerosis (ICD10-I70.0). Electronically Signed   By: Abigail Miyamoto M.D.   On: 01/09/2021 14:08     Assessment and Plan:   SAYVON ARTERBERRY is a 87 y.o. male with a hx of permanent atrial fibrillation, CVA, hypertension, hyperlipidemia, BPH, memory loss who is being seen 12/12/2020 for the evaluation of CHF at the request of Dr. Criss Alvine.  Acute hypoxic respiratory failure: Multifactorial in the setting of HFpEF/possible PNA? Initial sats noted in the 70s per family.  He was placed on BiPAP while in the ED now weaned to nasal cannula at 6L.  Started on IV Lasix, net negative 2.7 L.  -- Continue IV Lasix, follow I's and O's/daily weights  Possible PNA?:  Chest x-ray today with increased opacity in the right upper lung suspicious for pneumonia.   -- Will defer management to primary  HFpEF: Echo 10/2020 with LVEF of 60 to 65%, mild LVH, severely dilated left atrium.  Has been on Lasix 40 mg daily PTA.  -- Continue IV Lasix  Persistent Afib: History of the same, on Coumadin.  Some intermittent elevated rates though in the setting of acute illness. --INR subtherapeutic at 1.6 on admission --Coumadin per pharmD  Hx of CVA secondary to embolic infarct: On Coumadin PTA   Risk Assessment/Risk Scores:     New York Heart Association (NYHA) Functional Class NYHA Class II  CHA2DS2-VASc Score = 6  This indicates a 9.7% annual risk of stroke. The patient's score is based upon: CHF History: 1 HTN History: 1 Diabetes History: 0 Stroke History: 2 Vascular Disease History: 0 Age Score: 2 Gender Score: 0   For questions or updates, please contact [place] Please consult [email or web address] for contact info under    Signed, Reino Bellis, NP  12/12/2020 2:08 PM

## 2020-12-13 ENCOUNTER — Inpatient Hospital Stay (HOSPITAL_COMMUNITY): Payer: Medicare Other

## 2020-12-13 DIAGNOSIS — J9601 Acute respiratory failure with hypoxia: Secondary | ICD-10-CM | POA: Diagnosis not present

## 2020-12-13 DIAGNOSIS — I639 Cerebral infarction, unspecified: Secondary | ICD-10-CM

## 2020-12-13 DIAGNOSIS — I5033 Acute on chronic diastolic (congestive) heart failure: Secondary | ICD-10-CM | POA: Diagnosis not present

## 2020-12-13 DIAGNOSIS — I5030 Unspecified diastolic (congestive) heart failure: Secondary | ICD-10-CM | POA: Diagnosis not present

## 2020-12-13 LAB — BLOOD GAS, ARTERIAL
Acid-Base Excess: 9.5 mmol/L — ABNORMAL HIGH (ref 0.0–2.0)
Bicarbonate: 33.6 mmol/L — ABNORMAL HIGH (ref 20.0–28.0)
Drawn by: 336832
FIO2: 40
O2 Saturation: 96.4 %
Patient temperature: 37
pCO2 arterial: 47.1 mmHg (ref 32.0–48.0)
pH, Arterial: 7.468 — ABNORMAL HIGH (ref 7.350–7.450)
pO2, Arterial: 80.7 mmHg — ABNORMAL LOW (ref 83.0–108.0)

## 2020-12-13 LAB — ECHOCARDIOGRAM COMPLETE
AR max vel: 2.44 cm2
AV Area VTI: 2.02 cm2
AV Area mean vel: 2.5 cm2
AV Mean grad: 3 mmHg
AV Peak grad: 5.8 mmHg
Ao pk vel: 1.2 m/s
Height: 73 in
S' Lateral: 2.8 cm
Weight: 3181.68 oz

## 2020-12-13 LAB — CBC
HCT: 43.5 % (ref 39.0–52.0)
HCT: 42.9 % (ref 39.0–52.0)
Hemoglobin: 13.7 g/dL (ref 13.0–17.0)
Hemoglobin: 13.8 g/dL (ref 13.0–17.0)
MCH: 28.2 pg (ref 26.0–34.0)
MCH: 28.5 pg (ref 26.0–34.0)
MCHC: 31.5 g/dL (ref 30.0–36.0)
MCHC: 32.2 g/dL (ref 30.0–36.0)
MCV: 89.5 fL (ref 80.0–100.0)
MCV: 88.5 fL (ref 80.0–100.0)
Platelets: UNDETERMINED 10*3/uL (ref 150–400)
Platelets: UNDETERMINED 10*3/uL (ref 150–400)
RBC: 4.86 MIL/uL (ref 4.22–5.81)
RBC: 4.85 MIL/uL (ref 4.22–5.81)
RDW: 14.5 % (ref 11.5–15.5)
RDW: 14.6 % (ref 11.5–15.5)
WBC: 11.1 10*3/uL — ABNORMAL HIGH (ref 4.0–10.5)
WBC: 12.3 10*3/uL — ABNORMAL HIGH (ref 4.0–10.5)
nRBC: 0 % (ref 0.0–0.2)
nRBC: 0 % (ref 0.0–0.2)

## 2020-12-13 LAB — COMPREHENSIVE METABOLIC PANEL
ALT: 17 U/L (ref 0–44)
AST: 31 U/L (ref 15–41)
Albumin: 3.1 g/dL — ABNORMAL LOW (ref 3.5–5.0)
Alkaline Phosphatase: 58 U/L (ref 38–126)
Anion gap: 9 (ref 5–15)
BUN: 19 mg/dL (ref 8–23)
CO2: 32 mmol/L (ref 22–32)
Calcium: 9 mg/dL (ref 8.9–10.3)
Chloride: 91 mmol/L — ABNORMAL LOW (ref 98–111)
Creatinine, Ser: 1.19 mg/dL (ref 0.61–1.24)
GFR, Estimated: 59 mL/min — ABNORMAL LOW (ref >60)
Glucose, Bld: 138 mg/dL — ABNORMAL HIGH (ref 70–99)
Potassium: 3.5 mmol/L (ref 3.5–5.1)
Sodium: 132 mmol/L — ABNORMAL LOW (ref 135–145)
Total Bilirubin: 1.8 mg/dL — ABNORMAL HIGH (ref 0.3–1.2)
Total Protein: 7.8 g/dL (ref 6.5–8.1)

## 2020-12-13 LAB — PLATELET COUNT: Platelets: UNDETERMINED 10*3/uL (ref 150–400)

## 2020-12-13 LAB — LACTIC ACID, PLASMA
Lactic Acid, Venous: 1.6 mmol/L (ref 0.5–1.9)
Lactic Acid, Venous: 1.3 mmol/L (ref 0.5–1.9)

## 2020-12-13 LAB — PROCALCITONIN: Procalcitonin: 0.11 ng/mL

## 2020-12-13 LAB — MRSA NEXT GEN BY PCR, NASAL: MRSA by PCR Next Gen: NOT DETECTED

## 2020-12-13 LAB — PROTIME-INR
INR: 1.6 — ABNORMAL HIGH (ref 0.8–1.2)
Prothrombin Time: 19 seconds — ABNORMAL HIGH (ref 11.4–15.2)

## 2020-12-13 LAB — MAGNESIUM: Magnesium: 2 mg/dL (ref 1.7–2.4)

## 2020-12-13 MED ORDER — ASPIRIN 300 MG RE SUPP: 300.0000 mg | Freq: Once | RECTAL | Status: DC

## 2020-12-13 MED ORDER — FUROSEMIDE 10 MG/ML IJ SOLN
40.0000 mg | Freq: Two times a day (BID) | INTRAMUSCULAR | Status: DC
  Administered 2020-12-13 – 2020-12-14 (×3): 40 mg via INTRAVENOUS
  Filled 2020-12-13 (×5): qty 4

## 2020-12-13 MED ORDER — WARFARIN SODIUM 7.5 MG PO TABS: 7.5000 mg | ORAL_TABLET | Freq: Once | ORAL | Status: DC

## 2020-12-13 MED ORDER — METOPROLOL TARTRATE 5 MG/5ML IV SOLN
2.5000 mg | Freq: Four times a day (QID) | INTRAVENOUS | Status: DC | PRN
  Administered 2020-12-15: 2.5 mg via INTRAVENOUS
  Filled 2020-12-13: qty 5

## 2020-12-13 MED ORDER — IOHEXOL 350 MG/ML SOLN
100.0000 mL | Freq: Once | INTRAVENOUS | Status: AC | PRN
  Administered 2020-12-13: 11:00:00 100 mL via INTRAVENOUS

## 2020-12-13 MED ORDER — SODIUM CHLORIDE 0.9 % IV SOLN: INTRAVENOUS | Status: DC | PRN

## 2020-12-13 MED ORDER — METOPROLOL SUCCINATE ER 100 MG PO TB24
100.0000 mg | ORAL_TABLET | Freq: Every day | ORAL | Status: DC
  Administered 2020-12-13 – 2020-12-18 (×6): 100 mg via ORAL
  Filled 2020-12-13 (×5): qty 1
  Filled 2020-12-13: qty 2

## 2020-12-13 MED ORDER — SODIUM CHLORIDE 0.9 % IV SOLN
1.0000 g | Freq: Two times a day (BID) | INTRAVENOUS | Status: DC
  Administered 2020-12-13: 12:00:00 1 g via INTRAVENOUS
  Filled 2020-12-13 (×2): qty 1

## 2020-12-13 MED ORDER — TENECTEPLASE FOR STROKE
0.2500 mg/kg | PACK | Freq: Once | INTRAVENOUS | Status: AC
  Administered 2020-12-13: 12:00:00 23 mg via INTRAVENOUS
  Filled 2020-12-13: qty 10

## 2020-12-13 MED ORDER — ACETAMINOPHEN 10 MG/ML IV SOLN
1000.0000 mg | Freq: Four times a day (QID) | INTRAVENOUS | Status: AC
  Administered 2020-12-13 – 2020-12-14 (×4): 1000 mg via INTRAVENOUS
  Filled 2020-12-13 (×5): qty 100

## 2020-12-13 MED ORDER — SODIUM CHLORIDE 0.9 % IV SOLN
100.0000 mg | Freq: Two times a day (BID) | INTRAVENOUS | Status: DC
  Administered 2020-12-13 – 2020-12-16 (×6): 100 mg via INTRAVENOUS
  Filled 2020-12-13 (×9): qty 100

## 2020-12-13 MED ORDER — SODIUM CHLORIDE 0.9 % IV SOLN
2.0000 g | Freq: Two times a day (BID) | INTRAVENOUS | Status: DC
  Administered 2020-12-13 – 2020-12-14 (×2): 2 g via INTRAVENOUS
  Filled 2020-12-13 (×2): qty 2

## 2020-12-13 MED ORDER — CHLORHEXIDINE GLUCONATE CLOTH 2 % EX PADS
6.0000 | MEDICATED_PAD | Freq: Every day | CUTANEOUS | Status: DC
  Administered 2020-12-13 – 2020-12-16 (×4): 6 via TOPICAL

## 2020-12-13 NOTE — Progress Notes (Signed)
PT Cancellation Note  Patient Details Name: [name] MRN: [id number] DOB: [birth year]   Cancelled Treatment:    Reason Eval/Treat Not Completed: Medical issues which prohibited therapy Per OT, new code stroke has been called on patient as he cannot move LUE and balance is far from baseline. Holding PT for now to allow for rapid medical workup.   Windell Norfolk, DPT, PN2   Supplemental Physical Therapist [place]    Pager [phone] Acute Rehab Office [phone]

## 2020-12-13 NOTE — Progress Notes (Addendum)
PROGRESS NOTE    [name]  LAG:[id number] DOB: [birth year] DOA: 12/26/2020 PCP: Gaynelle Arabian, MD     Brief Narrative:  87 years old male with past medical history of CHF, persistent atrial fibrillation, hypertension, history of CVA, thrombocytopenia, came with a chief complaint of shortness of breath.  The symptoms started 1 week ago. His saturation dropped at home in the 70s. In the emergency room he was found to be in congestive heart failure, with a BNP elevated to 31 troponin elevated at 41 trended down to 38 on repeat Respiratory panel negative chest x-ray showed edema Patient received Lasix IV 80 mg, DuoNebs, metoprolol, INR subtherapeutic 1.6. Patient used to take Lasix 40 mg daily BUN and creatinine were normal  Assessment & Plan:  CHF exacerbation with preserved ejection fraction Last echocardiogram in October of this year ejection fraction 60 to 65% with inability to evaluate diastolic function Mild elevated pulmonary artery pressure as well as severely dilated left atrium Continue with BiPAP Continue with Lasix 80 mg twice daily will be changed tomorrow when he is out of BiPAP Will try nasal cannula during the day and BiPAP during the night Check potassium level was low in the emergency room Daily weights and strict intake output BUN and creatinine normal Urinary output 2700 Cardiology consult Speech to evaluate at bedside  12/04   Patient patient very somnolent Okay on nasal cannula Order ABG to be sure there is no related to CO2 Speech and physical therapy to evaluate 1023 stroke alert was called Left side weakness CT of the head CT angio no big vessel occlusion/old stroke Await for MRI We will keep the patient n.p.o. Patient received 7.5 mg of Coumadin last night INR 1.6 Patient received thrombolytics moved to ICU Suspicion of bilateral pneumonia seen during the CT angio Order empirically with cefepime, lactic acid, procalcitonin Given aspirin 300  mg rectally  Persistent atrial fibrillation Rate controlled Metoprolol IV 2.5 mg every 6 hours as needed for heart rate more than 110 Because the patient is n.p.o. Resume p.o. medication when patient is told by staff Coumadin per pharmacy 7.5 mg last night INR subtherapeutic 1.6 Patient received thrombolytics no anticoagulants no anticoagulant in the next 24 hours Moved to ICU   Stroke alert rule out CVA Neurology consult See CT angio results and recommendation Patient received thrombolytics Await for MRI Left-sided weakness Very sleepy History of CVA secondary to embolus Was on aspirin and Coumadin   Acute encephalopathy 12/03 Questionable change in mental status per family On BiPAP now on nasal cannula Follow only commands open his eyes Will follow if no improvement neurology consult CT head  12/04 Was better yesterday And earlier this morning When I saw him was very sleepy Order an ABG CO2 and pH normal range  Bilateral apical infiltrates seen on the CT angio and x ray Suspicion of bilateral  pneumonia Patient is treated for CHF Will be empirically lactic acid, procalcitonin, Maxipime and Doxy Covid and Flu and RSV neg     Principal Problem:   CHF exacerbation ([place]) Active Problems:   Atrial fibrillation ([place])   Hx of Embolic stroke ([place])   Essential hypertension   Chronic venous stasis dermatitis      DVT prophylaxis: Coumadin Code Status: (Full code Family Communication: Family at the bedside Disposition Plan: Based on evolution next week    Consultants:  Cardiology  neurology  Procedures: (   Antimicrobials: (Maxipime  Subjective: Sleepy, lethargic   Objective: Vitals:   12/13/20  1696 12/13/20 0643 12/13/20 0745 12/13/20 1019  BP: 125/67 140/82 131/83   Pulse: 74  (!) 109 (!) 139  Resp: 19  (!) 25   Temp: 98.4 F (36.9 C)  98.4 F (36.9 C)   TempSrc: Oral  Oral   SpO2: 93%  95%   Weight: 90.2 kg     Height:         Intake/Output Summary (Last 24 hours) at 12/13/2020 1121 Last data filed at 12/13/2020 0415 Gross per 24 hour  Intake 510 ml  Output 2400 ml  Net -1890 ml   Filed Weights   12/10/2020 2111 12/12/20 0545 12/13/20 0344  Weight: 91.5 kg 92.5 kg 90.2 kg    Examination:  General exam: Appears calm and comfortable  Respiratory system: Clear to auscultation. Respiratory effort normal. Cardiovascular system: S1 & S2 heard, RRR. No JVD, murmurs, rubs, gallops or clicks. No pedal edema. Gastrointestinal system: Abdomen is nondistended, soft and nontender. No organomegaly or masses felt. Normal bowel sounds heard. Central nervous system: Alert to sleepy to evaluate . Extremities: Unable to evaluate. Skin: No rashes, lesions or ulcers Psychiatry: Unable to evaluate patient was sleepy.   10:23 AM left-sided weakness Stroke alert called   Data Reviewed: I have personally reviewed following labs and imaging studies  CBC: Recent Labs  Lab 12/27/2020 1400 12/12/20 0053 12/13/20 0200  WBC 7.9 6.9 11.1*  NEUTROABS 6.0  --   --   HGB 13.9 13.6 13.7  HCT 43.4 42.3 43.5  MCV 87.7 88.7 89.5  PLT PLATELET CLUMPS NOTED ON SMEAR, UNABLE TO ESTIMATE PLATELET CLUMPS NOTED ON SMEAR, UNABLE TO ESTIMATE PLATELET CLUMPS NOTED ON SMEAR, UNABLE TO ESTIMATE   Basic Metabolic Panel: Recent Labs  Lab 12/22/2020 1400 12/25/2020 1550 01/04/2021 2123 12/12/20 0053 12/12/20 1423  NA 135  --  135 136 137  K 3.2*  --  3.1* 3.5 3.8  CL 93*  --  93* 93* 95*  CO2 34*  --  34* 36* 33*  GLUCOSE 129*  --  115* 113* 87  BUN 14  --  11 12 12   CREATININE 0.96  --  1.05 1.03 1.17  CALCIUM 9.9  --  8.7* 8.9 9.0  MG  --  1.9  --   --  2.0   GFR: Estimated Creatinine Clearance: 49.3 mL/min (by C-G formula based on SCr of 1.17 mg/dL). Liver Function Tests: Recent Labs  Lab 12/16/2020 1400 12/12/20 0053  AST 23 27  ALT 16 19  ALKPHOS 55 58  BILITOT 0.8 0.9  PROT 8.6* 7.9  ALBUMIN 4.0 3.2*   No results for  input(s): LIPASE, AMYLASE in the last 168 hours. No results for input(s): AMMONIA in the last 168 hours. Coagulation Profile: Recent Labs  Lab 12/12/2020 1400 12/12/20 0053 12/13/20 0200  INR 1.6* 1.6* 1.6*   Cardiac Enzymes: No results for input(s): CKTOTAL, CKMB, CKMBINDEX, TROPONINI in the last 168 hours. BNP (last 3 results) No results for input(s): PROBNP in the last 8760 hours. HbA1C: No results for input(s): HGBA1C in the last 72 hours. CBG: No results for input(s): GLUCAP in the last 168 hours. Lipid Profile: No results for input(s): CHOL, HDL, LDLCALC, TRIG, CHOLHDL, LDLDIRECT in the last 72 hours. Thyroid Function Tests: No results for input(s): TSH, T4TOTAL, FREET4, T3FREE, THYROIDAB in the last 72 hours. Anemia Panel: No results for input(s): VITAMINB12, FOLATE, FERRITIN, TIBC, IRON, RETICCTPCT in the last 72 hours. Sepsis Labs: Recent Labs  Lab 12/28/2020 1400 12/14/2020 1550  LATICACIDVEN 1.3 1.4    Recent Results (from the past 240 hour(s))  Resp Panel by RT-PCR (Flu A&B, Covid) Nasopharyngeal Swab     Status: None   Collection Time: 12/25/2020  2:00 PM   Specimen: Nasopharyngeal Swab; Nasopharyngeal(NP) swabs in vial transport medium  Result Value Ref Range Status   SARS Coronavirus 2 by RT PCR NEGATIVE NEGATIVE Final    Comment: (NOTE) SARS-CoV-2 target nucleic acids are NOT DETECTED.  The SARS-CoV-2 RNA is generally detectable in upper respiratory specimens during the acute phase of infection. The lowest concentration of SARS-CoV-2 viral copies this assay can detect is 138 copies/mL. A negative result does not preclude SARS-Cov-2 infection and should not be used as the sole basis for treatment or other patient management decisions. A negative result may occur with  improper specimen collection/handling, submission of specimen other than nasopharyngeal swab, presence of viral mutation(s) within the areas targeted by this assay, and inadequate number of  viral copies(<138 copies/mL). A negative result must be combined with clinical observations, patient history, and epidemiological information. The expected result is Negative.  Fact Sheet for Patients:  EntrepreneurPulse.com.au  Fact Sheet for Healthcare Providers:  IncredibleEmployment.be  This test is no t yet approved or cleared by the [place] FDA and  has been authorized for detection and/or diagnosis of SARS-CoV-2 by FDA under an Emergency Use Authorization (EUA). This EUA will remain  in effect (meaning this test can be used) for the duration of the COVID-19 declaration under Section 564(b)(1) of the Act, 21 U.S.C.section 360bbb-3(b)(1), unless the authorization is terminated  or revoked sooner.       Influenza A by PCR NEGATIVE NEGATIVE Final   Influenza B by PCR NEGATIVE NEGATIVE Final    Comment: (NOTE) The Xpert Xpress SARS-CoV-2/FLU/RSV plus assay is intended as an aid in the diagnosis of influenza from Nasopharyngeal swab specimens and should not be used as a sole basis for treatment. Nasal washings and aspirates are unacceptable for Xpert Xpress SARS-CoV-2/FLU/RSV testing.  Fact Sheet for Patients: EntrepreneurPulse.com.au  Fact Sheet for Healthcare Providers: IncredibleEmployment.be  This test is not yet approved or cleared by the [place] FDA and has been authorized for detection and/or diagnosis of SARS-CoV-2 by FDA under an Emergency Use Authorization (EUA). This EUA will remain in effect (meaning this test can be used) for the duration of the COVID-19 declaration under Section 564(b)(1) of the Act, 21 U.S.C. section 360bbb-3(b)(1), unless the authorization is terminated or revoked.  Performed at [facility], [address], [place], [place] [address]          Radiology Studies: [facility] Chest [facility] 1 View  Result Date: 12/12/2020 CLINICAL DATA:   Hypoxia/CHF. EXAM: PORTABLE CHEST 1 VIEW COMPARISON:  Chest x-rays dated 12/13/2020, 10/18/2020 and 10/12/2009. FINDINGS: Heart size and mediastinal contours are stable. Stable bilateral interstitial prominence. Increased opacity within the RIGHT upper lung. Probable mild bibasilar atelectasis. Chronic elevation of the RIGHT hemidiaphragm. Osseous structures about the chest are unremarkable. IMPRESSION: 1. Increased opacity within the RIGHT upper lung, suspicious for pneumonia, alternatively asymmetric edema or atelectasis. 2. Bilateral interstitial prominence, suggesting mild edema versus chronic interstitial lung disease. 3. Probable mild bibasilar atelectasis. Electronically Signed   By: Franki Cabot M.D.   On: 12/12/2020 08:39   DG Chest Portable 1 View  Result Date: 12/17/2020 CLINICAL DATA:  Shortness of breath. EXAM: PORTABLE CHEST 1 VIEW COMPARISON:  10/18/2020 FINDINGS: Low volume, AP portable radiograph. Patient rotated left. Cardiomegaly accentuated by AP portable technique.  Atherosclerosis in the transverse aorta. Moderate right hemidiaphragm elevation. No large pleural effusion. No pneumothorax. Pulmonary interstitial prominence, accentuated by AP portable technique and low lung volumes. Possible left greater than right base airspace disease. IMPRESSION: Multifocal degradation, including patient positioning and AP portable technique. Cardiomegaly with suspicion of mild interstitial edema. Cannot exclude bibasilar airspace disease. Consider PA and lateral radiographs if feasible. Aortic Atherosclerosis (ICD10-I70.0). Electronically Signed   By: Abigail Miyamoto M.D.   On: 12/18/2020 14:08   CT HEAD CODE STROKE WO CONTRAST  Result Date: 12/13/2020 CLINICAL DATA:  Code stroke.  87 year old male. EXAM: CT HEAD WITHOUT CONTRAST TECHNIQUE: Contiguous axial images were obtained from the base of the skull through the vertex without intravenous contrast. COMPARISON:  Head CT 10/14/2020.  Brain MRI  05/05/2006. FINDINGS: Brain: Stable cerebral volume. Nonspecific ventriculomegaly including cavum septum pellucidum. No midline shift, mass effect, or evidence of intracranial mass lesion. No acute intracranial hemorrhage identified. 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 Chronic right caudate lacunar infarct. Patchy additional bilateral white matter hypodensity. No cortically based acute infarct identified. Vascular: Intracranial artery ectasia. Calcified atherosclerosis at the skull base. No suspicious intracranial vascular hyperdensity. Skull: Motion artifact at the skull base. No acute osseous abnormality identified. Sinuses/Orbits: Visualized paranasal sinuses and mastoids are clear. Other: Slight rightward gaze. No other acute orbit or scalp soft tissue finding. ASPECTS [facility] Stroke Program Early CT Score) Total score (0-10 with 10 being normal): 10 IMPRESSION: 1. No acute cortically based infarct or acute intracranial hemorrhage identified. ASPECTS 10. 2. Chronic Left PCA territory infarct and right caudate lacune. 3. These results were communicated to Dr. Cheral Marker at 10:50 am on 12/13/2020 by text page via the [facility] messaging system. Electronically Signed   By: Genevie Ann M.D.   On: 12/13/2020 10:51   VAS US CAROTID  Result Date: 12/12/2020 Carotid Arterial Duplex Study Patient Name:  JARY LOUVIER  Date of Exam:   12/12/2020 Medical Rec #: [id number]      Accession #:    [id number] Date of Birth: [birth year]      Patient Gender: M Patient Age:   62 years Exam Location:  [facility] Procedure:      VAS US CAROTID Referring Phys: Riki Rusk Jovaun Levene --------------------------------------------------------------------------------  Indications:       TIA and Altered mental status. Risk Factors:      Hypertension, past history of smoking, prior CVA. Other Factors:     Atrial fibrillation. Limitations        Today's exam was limited due to Altered mental status and                     positioning. Comparison Study:  No prior study on file Performing Technologist: Sharion Dove RVS  Examination Guidelines: A complete evaluation includes B-mode imaging, spectral Doppler, color Doppler, and power Doppler as needed of all accessible portions of each vessel. Bilateral testing is considered an integral part of a complete examination. Limited examinations for reoccurring indications may be performed as noted.  Right Carotid Findings: +----------+--------+--------+--------+------------------+---------+           PSV cm/sEDV cm/sStenosisPlaque DescriptionComments  +----------+--------+--------+--------+------------------+---------+ CCA Prox  42      14              heterogenous                +----------+--------+--------+--------+------------------+---------+ CCA Distal42      15  heterogenous                +----------+--------+--------+--------+------------------+---------+ ICA Prox  52      18              calcific          Shadowing +----------+--------+--------+--------+------------------+---------+ ICA Distal45      17                                tortuous  +----------+--------+--------+--------+------------------+---------+ ECA       67      2                                           +----------+--------+--------+--------+------------------+---------+ +----------+--------+-------+--------+-------------------+           PSV cm/sEDV cmsDescribeArm Pressure (mmHG) +----------+--------+-------+--------+-------------------+ Subclavian80                                         +----------+--------+-------+--------+-------------------+ +---------+--------+--+--------+-+ VertebralPSV cm/s81EDV cm/s0 +---------+--------+--+--------+-+  Left Carotid Findings: +----------+--------+--------+--------+------------------+--------+           PSV cm/sEDV cm/sStenosisPlaque DescriptionComments  +----------+--------+--------+--------+------------------+--------+ CCA Prox  56      12              heterogenous               +----------+--------+--------+--------+------------------+--------+ CCA Distal53      11              heterogenous               +----------+--------+--------+--------+------------------+--------+ ICA Prox  38      10              heterogenous               +----------+--------+--------+--------+------------------+--------+ ICA Distal48      9                                 tortuous +----------+--------+--------+--------+------------------+--------+ ECA       42      10                                         +----------+--------+--------+--------+------------------+--------+ +----------+--------+--------+--------+-------------------+           PSV cm/sEDV cm/sDescribeArm Pressure (mmHG) +----------+--------+--------+--------+-------------------+ [id number]                                         +----------+--------+--------+--------+-------------------+   Summary: Right Carotid: Velocities in the right ICA are consistent with a 1-39% stenosis. Left Carotid: Velocities in the left ICA are consistent with a 1-39% stenosis. Vertebrals:  Bilateral vertebral arteries demonstrate antegrade flow. Subclavians: Normal flow hemodynamics were seen in bilateral subclavian              arteries. *See table(s) above for measurements and observations.     Preliminary    CT ANGIO HEAD NECK W WO CM W PERF (CODE STROKE)  Result Date: 12/13/2020 CLINICAL DATA:  87 year old male code stroke presentation. EXAM: CT ANGIOGRAPHY HEAD AND NECK CT PERFUSION BRAIN TECHNIQUE: Multidetector CT imaging of the head and neck was performed using the standard protocol during bolus administration of intravenous contrast. Multiplanar CT image reconstructions and MIPs were obtained to evaluate the vascular anatomy. Carotid stenosis measurements (when applicable) are  obtained utilizing NASCET criteria, using the distal internal carotid diameter as the denominator. Multiphase CT imaging of the brain was performed following IV bolus contrast injection. Subsequent parametric perfusion maps were calculated using RAPID software. CONTRAST:  133mL OMNIPAQUE IOHEXOL 350 MG/ML SOLN COMPARISON:  Plain head CT 1034 hours today. FINDINGS: CT Brain Perfusion Findings: ASPECTS: 10 CBF (<30%) Volume: None Perfusion (Tmax>6.0s) volume: None Mismatch Volume: Not applicable Infarction Location:Not applicable CTA NECK Skeleton: Absent maxillary dentition. Widespread advanced cervical spine degeneration. Mild T3 compression fracture, favor chronic. Upper chest: Patchy and confluent peribronchial and peripheral upper lung opacity bilaterally. No superior mediastinal lymphadenopathy. There are small postinflammatory calcified superior mediastinal nodes. Other neck: Negative. Aortic arch: Calcified aortic atherosclerosis. Three vessel arch configuration. Right carotid system: Mild brachiocephalic and right CCA plaque and tortuosity without stenosis. Calcified plaque at the right ICA origin and bulb with less than 50 % stenosis with respect to the distal vessel. Mildly tortuous right ICA distal to the bulb. Left carotid system: Tortuous left CCA with mild calcified plaque and no stenosis proximal to the bifurcation. Mild to moderate left ICA origin and bulb calcified plaque but no significant stenosis. Mild tortuosity distal to the bulb. Vertebral arteries: Tortuous proximal subclavian artery with calcified plaque but no significant stenosis. Calcified plaque at the right vertebral artery origin results in mild to moderate stenosis. Patent right vertebral artery to the skull base without additional plaque or stenosis. Tortuous proximal left subclavian artery with calcified plaque and a kinked appearance at the thoracic inlet but no significant stenosis. Normal left vertebral artery origin. Mild left V1  segment tortuosity and plaque without stenosis. Mildly non dominant left vertebral artery is patent to the skull base. CTA HEAD Posterior circulation: Mildly dominant right V4 segment. Mild V4 calcified plaque without stenosis. Normal PICA origins. Patent vertebrobasilar junction and basilar artery without stenosis. SCA and PCA origins are normal. Right posterior communicating artery is present, the left is diminutive or absent. Right PCA branches are patent with moderate irregularity and stenosis in the P2 segment on series 13, image 24. Left MCA branches are irregular and attenuated distally, in the setting of chronic left PCA infarct. Anterior circulation: Both ICA siphons are patent with tortuosity. Mild bilateral siphon calcified plaque without stenosis. Normal right posterior communicating artery origin. Patent carotid termini. Normal MCA and ACA origins. Mildly dominant right ACA A1. Normal anterior communicating artery. Bilateral ACA branches are within normal limits. Left MCA M1 segment, left MCA bifurcation and branches are within normal limits. Right MCA M1 segment and trifurcation are patent. Right MCA branches are within normal limits. Venous sinuses: Early contrast timing, not evaluated. Anatomic variants: Mildly dominant right vertebral artery. Review of the MIP images confirms the above findings IMPRESSION: 1. Negative for large vessel occlusion, and no core infarct or ischemic penumbra detected by CT Perfusion. 2. Atherosclerosis in the head and neck. Up to moderate associated stenosis at the Right Vertebral Artery origin. And moderate irregularity and stenosis Left > Right PCA P2 and distal branches, in the setting of chronic Left PCA infarct. 3. Bilateral upper lung pulmonary opacity suspicious for Bilateral Pneumonia. Consider viral/atypical etiology. 4. Aortic Atherosclerosis (ICD10-I70.0). Salient findings discussed  by telephone with Dr. Cheral Marker on 12/13/2020 at 11:15. Electronically Signed    By: Genevie Ann M.D.   On: 12/13/2020 11:18        Scheduled Meds:  atorvastatin  20 mg Oral Daily   furosemide  40 mg Intravenous BID   metoprolol succinate  100 mg Oral Daily   sodium chloride flush  3 mL Intravenous Q12H   warfarin  7.5 mg Oral ONCE-1600   Warfarin - Pharmacist Dosing Inpatient   Does not apply q1600   Continuous Infusions:   LOS: 2 days    Time spent: More than 35 minutes  DVT prophylaxis No anticoagulation next 24 hours SCD  Doniel Maiello G Arlando Leisinger, MD [facility]   If 7PM-7AM, please contact night-coverage [email or web address] Password [email or web address] 12/13/2020, 11:21 AM

## 2020-12-13 NOTE — Progress Notes (Signed)
RN notified RT that pt keeps pulling off Bipap. RN placed pt back on [place].

## 2020-12-13 NOTE — Evaluation (Signed)
Clinical/Bedside Swallow Evaluation Patient Details  Name: [name] MRN: [id number] Date of Birth: 1930-04-21  Today's Date: 12/13/2020 Time: SLP Start Time (ACUTE ONLY): 7425 SLP Stop Time (ACUTE ONLY): 0930 SLP Time Calculation (min) (ACUTE ONLY): 17 min  Past Medical History:  Past Medical History:  Diagnosis Date   Atrial fibrillation ([place]) 10/15/2012   Cancer ([place])    Skin (R hand)   Embolic stroke ([place]) 95/63/8756   Essential hypertension 01/21/2014   Long term current use of anticoagulant therapy 12/25/2012   Past Surgical History:  Past Surgical History:  Procedure Laterality Date   APPENDECTOMY     CARDIAC CATHETERIZATION     CYSTOSCOPY WITH INSERTION OF UROLIFT N/A 10/10/2017   Procedure: CYSTOSCOPY WITH INSERTION OF UROLIFT;  Surgeon: [name], MD;  Location: [facility];  Service: Urology;  Laterality: N/A;  NEEDS 60 MIN   EYE SURGERY Bilateral 2013   Cataracts   HPI:  Pt is an 87 y.o. male who presented with ongoing shortness of breath. Dx acute enceophalopathy. CXR 12/3: Increased opacity within the right upper lung, suspicious for pneumonia, alternatively asymmetric edema or atelectasis. PMH: CHF, atrial fibrillation, venous stasis, hypertension, history of CVA, thrombocytopenia.    Assessment / Plan / Recommendation  Clinical Impression  Pt was seen for bedside swallow evaluation with his son present. Pt's son reported that the pt demonstrates occasional (described as ~2x/wk) coughing with dry foods and that he exhibited coughing with cereal the day prior to admission. Oral mechanism exam was [facility] and he presented with full dentures. Pt exhibited delayed coughing with five consecutive swallows of thin liquids via straw, but he tolerated all other solids and up to three consecutive swallows of thin liquids without overt s/sx of aspiration. Oral phase was [facility] and no other symptoms of pharyngeal dysphagia were noted. Pt's current diet of regular texture solids and  thin liquids will be continued. SLP will follow to ensure diet tolerance and for further intervention if clinically indicated. SLP Visit Diagnosis: Dysphagia, unspecified (R13.10)    Aspiration Risk  Mild aspiration risk    Diet Recommendation Regular;Thin liquid   Liquid Administration via: Cup;Straw Medication Administration: Whole meds with puree Supervision: Staff to assist with self feeding Compensations: Slow rate;Small sips/bites Postural Changes: Seated upright at 90 degrees    Other  Recommendations Oral Care Recommendations: Oral care BID    Recommendations for follow up therapy are one component of a multi-disciplinary discharge planning process, led by the attending physician.  Recommendations may be updated based on patient status, additional functional criteria and insurance authorization.  Follow up Recommendations  (TBD)      Assistance Recommended at Discharge None  Functional Status Assessment Patient has had a recent decline in their functional status and demonstrates the ability to make significant improvements in function in a reasonable and predictable amount of time.  Frequency and Duration min 2x/week  1 week       Prognosis Prognosis for Safe Diet Advancement: Good      Swallow Study   General Date of Onset: 12/12/20 HPI: Pt is an 87 y.o. male who presented with ongoing shortness of breath. Dx acute enceophalopathy. CXR 12/3: Increased opacity within the right upper lung, suspicious for pneumonia, alternatively asymmetric edema or atelectasis. PMH: CHF, atrial fibrillation, venous stasis, hypertension, history of CVA, thrombocytopenia. Type of Study: Bedside Swallow Evaluation Previous Swallow Assessment: none Diet Prior to this Study: Regular;Thin liquids Temperature Spikes Noted: No Respiratory Status: Nasal cannula History of Recent Intubation:  No Behavior/Cognition: Alert;Cooperative;Confused;Pleasant mood;Requires cueing Oral Cavity Assessment:  Within Functional Limits Oral Care Completed by SLP: No Oral Cavity - Dentition: Dentures, top;Dentures, bottom Vision: Functional for self-feeding Self-Feeding Abilities: Needs assist Patient Positioning: Upright in bed;Postural control adequate for testing Baseline Vocal Quality: Low vocal intensity Volitional Cough: Cognitively unable to elicit Volitional Swallow: Able to elicit    Oral/Motor/Sensory Function Overall Oral Motor/Sensory Function: Within functional limits   Ice Chips Ice chips: Within functional limits Presentation: Spoon   Thin Liquid Thin Liquid: Impaired Presentation: Straw Pharyngeal  Phase Impairments: Cough - Delayed    Nectar Thick Nectar Thick Liquid: Not tested   Honey Thick Honey Thick Liquid: Not tested   Puree Puree: Within functional limits Presentation: Spoon   Solid     Solid: Within functional limits      Horton Marshall 12/13/2020,9:40 AM

## 2020-12-13 NOTE — Progress Notes (Incomplete)
Pt brought down to MRI for exam via pt transport. Pt cleared for exam from prior imaging per Dr. Tery Sanfilippo (Radiologist) on 1.5T scanner. Prepared pt for exam and began scanning. Pt struggled to remain still, moving head, reaching for head imaging coil and moving legs off of imaging table. Pt confused. Called nurse for possible meds to relax pt. RN unable to provide meds at this time. Able to obtain diagnostic AX DWI, and AX T2 FLAIR. Completed exam as limited study, best possible images obtained. Pt removed from scanner, transferred back to bed and sent back to room via pt transport. RN made aware.

## 2020-12-13 NOTE — Progress Notes (Signed)
  Echocardiogram 2D Echocardiogram has been performed.  [name] 12/13/2020, 12:53 PM

## 2020-12-13 NOTE — Progress Notes (Signed)
RT placed patient on BIPAP HS. 6L O2 bleed In needed. Patient tolerating well at this time.

## 2020-12-13 NOTE — Progress Notes (Signed)
PHARMACY NOTE:  ANTIMICROBIAL RENAL DOSAGE ADJUSTMENT  Current antimicrobial regimen includes a mismatch between antimicrobial dosage and estimated renal function.  As per policy approved by the Pharmacy & Therapeutics and Medical Executive Committees, the antimicrobial dosage will be adjusted accordingly.  Current antimicrobial dosage:  Cefepime 1g q 12 hrs  Indication: possible PNA  Renal Function:  Estimated Creatinine Clearance: 48.5 mL/min (by C-G formula based on SCr of 1.19 mg/dL).  Antimicrobial dosage has been changed to:  cefepime 2g q 12 hrs  Additional comments:   Thank you for allowing pharmacy to be a part of this patient's care. [place] Crane, Roylene Reason, BCCP Clinical Pharmacist  12/13/2020 9:24 PM   [facility] pharmacy phone numbers are listed on [email or web address]

## 2020-12-13 NOTE — Progress Notes (Signed)
Rapid response nurse called to bedside to reassess the pt for possible code stroke. Occupational therapist was working with a patient and she called RN to room to inform pt's status. Last time pt was okay around 9.30 am during round. Code stroke team activated by Rapid response Nurse. MD came to the room to assess the patient, family members in room and updated, will continue to monitor the patient   Palma Holter, RN

## 2020-12-13 NOTE — Code Documentation (Signed)
Stroke Response Nurse Documentation Code Documentation  [name] is a 87 y.o. male who was on [place] and arrived to [facility] on 12/12/20 with past medical hx of HF, HLD, Afib, HTN. On warfarin daily. Code stroke was activated by rapid response RN.  (EMS, ED).   Patient from [facility] where he was LKW at 0930 and now complaining of left sided weakness .   Stroke team at the bedside on patient arrival.  Patient to CT with team. NIHSS 9, see documentation for details and code stroke times. Patient with left arm weakness, left leg weakness, and left decreased sensation on exam. The following imaging was completed:  CT and CTA . Patient is a candidate for IV Thrombolytic.  Care/Plan: q 15 vs and q30 neuro checks x 12 hours.   Bedside handoff with RN Palma Holter.    [place]  Stroke Response RN

## 2020-12-13 NOTE — Progress Notes (Signed)
Pt transferred to 4N23 for higher level of care after pt received from MRI, pt left unit accompanied with RN with all of his belongings, Family members aware, report provided to the receiving RN Sarah.  Palma Holter, RN

## 2020-12-13 NOTE — Progress Notes (Signed)
PHARMACIST CODE STROKE RESPONSE  Notified to mix TNK at 11:38 by Dr. Cheral Marker Delivered TNK to RN at 11:41  TNK dose = 23 mg IV over 5 seconds  Issues/delays encountered (if applicable): pltc historically clumped and remained clumped on stat recheck - MD aware, discussed with entire stroke team and family, decision made to proceed  Einar Grad 12/13/20 11:46 AM

## 2020-12-13 NOTE — Progress Notes (Signed)
Progress Note  Patient Name: [name] Date of Encounter: 12/13/2020  [facility] Cardiologist: Sinclair Grooms, MD   Subjective   MR [name] feels well this AM. He is awake and alert. He does not have any issues.   Afib 100s-120s Bps 970Y to 637C systolic On 6L [place] off BIPAP PCO2- 47 improved. pO2 80  I/O-  net negative 3L on lasix 80 IV BID  Crt is normal K 3.8 Troponin 41->38 BNP 231 Lactic acid 1.4 Hgb normal No leukocytosis No influenza or COVID INR 1.6 subtherapeutic  Cxray 12/3 1. Increased opacity within the RIGHT upper lung, suspicious for pneumonia, alternatively asymmetric edema or atelectasis. 2. Bilateral interstitial prominence, suggesting mild edema versus chronic interstitial lung disease. 3. Probable mild bibasilar atelectasis.  Inpatient Medications    Scheduled Meds:  atorvastatin  20 mg Oral Daily   furosemide  80 mg Intravenous BID   metoprolol succinate  100 mg Oral Daily   sodium chloride flush  3 mL Intravenous Q12H   warfarin  7.5 mg Oral ONCE-1600   Warfarin - Pharmacist Dosing Inpatient   Does not apply q1600   Continuous Infusions:  PRN Meds: acetaminophen **OR** acetaminophen, albuterol, metoprolol tartrate, polyethylene glycol   Vital Signs    Vitals:   12/13/20 0009 12/13/20 0344 12/13/20 0643 12/13/20 0745  BP:  125/67 140/82 131/83  Pulse: (!) 104 74  (!) 109  Resp: (!) 22 19  (!) 25  Temp:  98.4 F (36.9 C)  98.4 F (36.9 C)  TempSrc:  Oral  Oral  SpO2: 94% 93%  95%  Weight:  90.2 kg    Height:        Intake/Output Summary (Last 24 hours) at 12/13/2020 0833 Last data filed at 12/13/2020 0415 Gross per 24 hour  Intake 510 ml  Output 3400 ml  Net -2890 ml   Last 3 Weights 12/13/2020 12/12/2020 01/01/2021  Weight (lbs) 198 lb 13.7 oz 203 lb 14.8 oz 201 lb 11.5 oz  Weight (kg) 90.2 kg 92.5 kg 91.5 kg      Telemetry    Atrial fibrillations 100s-120s - Personally Reviewed  ECG    NA - Personally  Reviewed  Physical Exam   Vitals:   12/13/20 0643 12/13/20 0745  BP: 140/82 131/83  Pulse:  (!) 109  Resp:  (!) 25  Temp:  98.4 F (36.9 C)  SpO2:  95%      GEN: No acute distress.  Sitting up. Conversive Neck: No JVD Cardiac: RRR, no murmurs, rubs, or gallops.  Respiratory: nl wob, decreased BS in the bases GI: Soft, nontender, non-distended  MS: No edema; No deformity. Neuro:  Nonfocal  Skin: warm and well perfused Psych: Normal affect   Labs    High Sensitivity Troponin:   Recent Labs  Lab 12/25/2020 1400 12/25/2020 1550  TROPONINIHS 41* 38*     Chemistry Recent Labs  Lab 12/22/2020 1400 01/04/2021 1550 12/17/2020 2123 12/12/20 0053 12/12/20 1423  NA 135  --  135 136 137  K 3.2*  --  3.1* 3.5 3.8  CL 93*  --  93* 93* 95*  CO2 34*  --  34* 36* 33*  GLUCOSE 129*  --  115* 113* 87  BUN 14  --  11 12 12   CREATININE 0.96  --  1.05 1.03 1.17  CALCIUM 9.9  --  8.7* 8.9 9.0  MG  --  1.9  --   --  2.0  PROT 8.6*  --   --  7.9  --   ALBUMIN 4.0  --   --  3.2*  --   AST 23  --   --  27  --   ALT 16  --   --  19  --   ALKPHOS 55  --   --  58  --   BILITOT 0.8  --   --  0.9  --   GFRNONAA >60  --  >60 >60 60*  ANIONGAP 8  --  8 7 9     Lipids No results for input(s): CHOL, TRIG, HDL, LABVLDL, LDLCALC, CHOLHDL in the last 168 hours.  Hematology Recent Labs  Lab 01/07/2021 1400 12/12/20 0053 12/13/20 0200  WBC 7.9 6.9 11.1*  RBC 4.95 4.77 4.86  HGB 13.9 13.6 13.7  HCT 43.4 42.3 43.5  MCV 87.7 88.7 89.5  MCH 28.1 28.5 28.2  MCHC 32.0 32.2 31.5  RDW 14.7 14.4 14.5  PLT PLATELET CLUMPS NOTED ON SMEAR, UNABLE TO ESTIMATE PLATELET CLUMPS NOTED ON SMEAR, UNABLE TO ESTIMATE PLATELET CLUMPS NOTED ON SMEAR, UNABLE TO ESTIMATE   Thyroid No results for input(s): TSH, FREET4 in the last 168 hours.  BNP Recent Labs  Lab 12/12/2020 1400  BNP 231.3*    DDimer No results for input(s): DDIMER in the last 168 hours.   Radiology    DG Chest Port 1 View  Result Date:  12/12/2020 CLINICAL DATA:  Hypoxia/CHF. EXAM: PORTABLE CHEST 1 VIEW COMPARISON:  Chest x-rays dated 12/18/2020, 10/18/2020 and 10/12/2009. FINDINGS: Heart size and mediastinal contours are stable. Stable bilateral interstitial prominence. Increased opacity within the RIGHT upper lung. Probable mild bibasilar atelectasis. Chronic elevation of the RIGHT hemidiaphragm. Osseous structures about the chest are unremarkable. IMPRESSION: 1. Increased opacity within the RIGHT upper lung, suspicious for pneumonia, alternatively asymmetric edema or atelectasis. 2. Bilateral interstitial prominence, suggesting mild edema versus chronic interstitial lung disease. 3. Probable mild bibasilar atelectasis. Electronically Signed   By: Franki Cabot M.D.   On: 12/12/2020 08:39   DG Chest Portable 1 View  Result Date: 01/08/2021 CLINICAL DATA:  Shortness of breath. EXAM: PORTABLE CHEST 1 VIEW COMPARISON:  10/18/2020 FINDINGS: Low volume, AP portable radiograph. Patient rotated left. Cardiomegaly accentuated by AP portable technique. Atherosclerosis in the transverse aorta. Moderate right hemidiaphragm elevation. No large pleural effusion. No pneumothorax. Pulmonary interstitial prominence, accentuated by AP portable technique and low lung volumes. Possible left greater than right base airspace disease. IMPRESSION: Multifocal degradation, including patient positioning and AP portable technique. Cardiomegaly with suspicion of mild interstitial edema. Cannot exclude bibasilar airspace disease. Consider PA and lateral radiographs if feasible. Aortic Atherosclerosis (ICD10-I70.0). Electronically Signed   By: Abigail Miyamoto M.D.   On: 12/29/2020 14:08   VAS US CAROTID  Result Date: 12/12/2020 Carotid Arterial Duplex Study Patient Name:  [name]  Date of Exam:   12/12/2020 Medical Rec #: [id number]      Accession #:    [id number] Date of Birth: 12/24/1930      Patient Gender: M Patient Age:   87 years Exam Location:  [facility] Procedure:      VAS US CAROTID Referring Phys: Riki Rusk CRISTESCU --------------------------------------------------------------------------------  Indications:       TIA and Altered mental status. Risk Factors:      Hypertension, past history of smoking, prior CVA. Other Factors:     Atrial fibrillation. Limitations        Today's exam was limited due to Altered mental status and  positioning. Comparison Study:  No prior study on file Performing Technologist: Sharion Dove RVS  Examination Guidelines: A complete evaluation includes B-mode imaging, spectral Doppler, color Doppler, and power Doppler as needed of all accessible portions of each vessel. Bilateral testing is considered an integral part of a complete examination. Limited examinations for reoccurring indications may be performed as noted.  Right Carotid Findings: +----------+--------+--------+--------+------------------+---------+           PSV cm/sEDV cm/sStenosisPlaque DescriptionComments  +----------+--------+--------+--------+------------------+---------+ CCA Prox  42      14              heterogenous                +----------+--------+--------+--------+------------------+---------+ CCA Distal42      15              heterogenous                +----------+--------+--------+--------+------------------+---------+ ICA Prox  52      18              calcific          Shadowing +----------+--------+--------+--------+------------------+---------+ ICA Distal45      17                                tortuous  +----------+--------+--------+--------+------------------+---------+ ECA       67      2                                           +----------+--------+--------+--------+------------------+---------+ +----------+--------+-------+--------+-------------------+           PSV cm/sEDV cmsDescribeArm Pressure (mmHG) +----------+--------+-------+--------+-------------------+  Subclavian80                                         +----------+--------+-------+--------+-------------------+ +---------+--------+--+--------+-+ VertebralPSV cm/s81EDV cm/s0 +---------+--------+--+--------+-+  Left Carotid Findings: +----------+--------+--------+--------+------------------+--------+           PSV cm/sEDV cm/sStenosisPlaque DescriptionComments +----------+--------+--------+--------+------------------+--------+ CCA Prox  56      12              heterogenous               +----------+--------+--------+--------+------------------+--------+ CCA Distal53      11              heterogenous               +----------+--------+--------+--------+------------------+--------+ ICA Prox  38      10              heterogenous               +----------+--------+--------+--------+------------------+--------+ ICA Distal48      9                                 tortuous +----------+--------+--------+--------+------------------+--------+ ECA       42      10                                         +----------+--------+--------+--------+------------------+--------+ +----------+--------+--------+--------+-------------------+  PSV cm/sEDV cm/sDescribeArm Pressure (mmHG) +----------+--------+--------+--------+-------------------+ Subclavian109                                         +----------+--------+--------+--------+-------------------+   Summary: Right Carotid: Velocities in the right ICA are consistent with a 1-39% stenosis. Left Carotid: Velocities in the left ICA are consistent with a 1-39% stenosis. Vertebrals:  Bilateral vertebral arteries demonstrate antegrade flow. Subclavians: Normal flow hemodynamics were seen in bilateral subclavian              arteries. *See table(s) above for measurements and observations.     Preliminary     Cardiac Studies    TTE 10/15/2020 1. Left ventricular ejection fraction, by estimation, is 60 to 65%.  The  left ventricle has normal function. The left ventricle has no regional  wall motion abnormalities. There is mild left ventricular hypertrophy.  Left ventricular diastolic function  could not be evaluated.   2. Right ventricular systolic function is normal. The right ventricular  size is normal. There is mildly elevated pulmonary artery systolic  pressure.   3. Left atrial size was severely dilated.   4. The mitral valve is grossly normal. Mild mitral valve regurgitation.   5. The aortic valve is tricuspid. There is mild calcification of the  aortic valve. There is mild thickening of the aortic valve. Aortic valve  regurgitation is not visualized.   Patient Profile     [name] is a 87 y.o. male with a hx of permanent atrial fibrillation, CVA, hypertension, hyperlipidemia, BPH, memory loss who is being seen 12/12/2020 for the evaluation of CHF at the request of Dr. Criss Alvine. Patient presents after recent hospitalization in October. Patient was evaluated by cardiology with lower suspicion of decompensated HF. His LV function is normal. He has venous stasis and lymphadema. He returns acute hypoxic and hypercapneic respiratory failure. PCO2 70.  Cxray shows RUL opacity c/f PNA , also findings c/w ILD. His clinical picture is more consistent with acute on chronic lung disease c/f PNA. He is euvolemic on exam but can benefit from diuresis to reduce pulmonary edema.   Assessment & Plan     #Acute hypoxic/hypercapneic respiratory failure: Now off BIPAP. pCO2 much improved. He is diuresing quite a bit, he is euvolemic on exam, will scale back on lasix to 40 mg IV BID. If crt bumps, would transition to oral dose.   #Atrial Fibrillation: He has chronic atrial fibrillation. His anti-arrhythmic choices are limited with ILD (cannot do amiodarone). Can continue BB prn especially if Bps are soft. He is rate controlled mostly, can continue IV PRN BB as needed. Can continue coumadin with INR goal  2-3.   #CVA: Stopped asa, had hx of CVA but considering age he is a high bleeding risk. Continue statin.  If doing well tomorrow, will consider signing off at that time. Thank you for the consult and don't hesitate to reach out for further questions.  For questions or updates, please contact [place] Please consult [email or web address] for contact info under        Signed, Janina Mayo, MD  12/13/2020, 8:33 AM

## 2020-12-13 NOTE — Progress Notes (Signed)
Pt left for MRI at 1300, neuro check every 15 min is in progress after IV tenecteplase, waiting for bed approval to transfer, family members are in bed side and updated about transfer  Will continue to monitor the patient  Palma Holter, Therapist, sports

## 2020-12-13 NOTE — Progress Notes (Signed)
ANTICOAGULATION CONSULT NOTE  Pharmacy Consult for Warfarin Indication: atrial fibrillation  No Known Allergies  Patient Measurements: Height: 6\' 1"  (185.4 cm) Weight: 90.2 kg (198 lb 13.7 oz) IBW/kg (Calculated) : 79.9  Vital Signs: Temp: 98.4 F (36.9 C) (12/04 0344) Temp Source: Oral (12/04 0344) BP: 140/82 (12/04 0643) Pulse Rate: 74 (12/04 0344)  Labs: Recent Labs    12/26/2020 1400 12/31/2020 1550 12/29/2020 2123 12/12/20 0053 12/12/20 1423 12/13/20 0200  HGB 13.9  --   --  13.6  --  13.7  HCT 43.4  --   --  42.3  --  43.5  PLT PLATELET CLUMPS NOTED ON SMEAR, UNABLE TO ESTIMATE  --   --  PLATELET CLUMPS NOTED ON SMEAR, UNABLE TO ESTIMATE  --  PLATELET CLUMPS NOTED ON SMEAR, UNABLE TO ESTIMATE  LABPROT 18.8*  --   --  19.1*  --  19.0*  INR 1.6*  --   --  1.6*  --  1.6*  CREATININE 0.96  --  1.05 1.03 1.17  --   TROPONINIHS 41* 38*  --   --   --   --      Estimated Creatinine Clearance: 49.3 mL/min (by C-G formula based on SCr of 1.17 mg/dL).   Medications:  Medications Prior to Admission  Medication Sig Dispense Refill Last Dose   acetaminophen (TYLENOL) 500 MG tablet Take 500-1,000 mg by mouth every 6 (six) hours as needed (FOR PAIN.).      albuterol (VENTOLIN HFA) 108 (90 Base) MCG/ACT inhaler Inhale 1 puff into the lungs every 4 (four) hours as needed for wheezing or shortness of breath.      aspirin EC 81 MG tablet Take 81 mg by mouth at bedtime.      calcium carbonate (OS-CAL) 600 MG TABS tablet Take 600 mg by mouth daily.       furosemide (LASIX) 40 MG tablet Take 1 tablet (40 mg total) by mouth daily. 30 tablet 0    Glucosamine HCl (GLUCOSAMINE PO) Take 1,500 mg by mouth daily.       metoprolol succinate (TOPROL-XL) 100 MG 24 hr tablet Take 1 tablet (100 mg total) by mouth daily. Take with or immediately following a meal. 30 tablet 0    Multiple Vitamin (MULTIVITAMIN) tablet Take 1 tablet by mouth daily.      Multiple Vitamins-Minerals (PRESERVISION AREDS 2)  CAPS Take 1 capsule by mouth 2 (two) times daily.      traMADol (ULTRAM) 50 MG tablet Take 1 tablet (50 mg total) by mouth every 6 (six) hours as needed. 10 tablet 0    warfarin (COUMADIN) 1 MG tablet Take as directed by the coumadin dept      warfarin (COUMADIN) 3 MG tablet Take as directed by coumadin dept      warfarin (COUMADIN) 5 MG tablet TAKE AS DIRECTED PER ANTICOAGULATION CLINIC (Patient taking differently: Take 5-7.5 mg by mouth See admin instructions. 7.5 mg on Sunday 5 mg Monday,Tuesday,Wednesday,Thursday,Friday,saturday) 100 tablet 0    Scheduled:   atorvastatin  20 mg Oral Daily   furosemide  80 mg Intravenous BID   sodium chloride flush  3 mL Intravenous Q12H   Warfarin - Pharmacist Dosing Inpatient   Does not apply q1600   Infusions:  PRN: acetaminophen **OR** acetaminophen, albuterol, metoprolol tartrate, polyethylene glycol Anti-infectives (From admission, onward)    None      Assessment: 31 yoM with hx AFib on warfarin at home admitted with acute HFpEF. INR subtherapeutic on admit.  Home warfarin dose is 5mg  daily except 7.5mg  Mondays.   INR this morning remains subtherapeutic at 1.6. Pltc unable to be analyzed due to clumping (pt has this historically).   Goal of Therapy:  INR 2-3 Monitor platelets by anticoagulation protocol: Yes   Plan:  Warfarin 7.5mg  PO x1 again tonight Daily protime  Arrie Senate, PharmD, BCPS, [facility] Clinical Pharmacist [phone] Please check AMION for all [place] numbers 12/13/2020

## 2020-12-13 NOTE — Evaluation (Signed)
Occupational Therapy Evaluation Patient Details Name: [name] MRN: [id number] DOB: [birth year] Today's Date: 12/13/2020   History of Present Illness [name] is a 87 y.o. male presenting with ongoing shortness of breath.Found to have CHF. PHMx:CHF, atrial fibrillation, venous stasis, hypertension, history of CVA (no residual), thrombocytopenia   Clinical Impression   This 87 yo male admitted with above presents to acute OT with PLOF of being able to get around with RW on his own in his house and toilet himself (gets A from son for bathing/dressing). Currently he is Mod-total A for basic ADLs and Max-total A +2 for bed mobility with Max-total A for sitting balance at EOB. He will continue to benefit from acute OT with follow up on CIR recommended.       Recommendations for follow up therapy are one component of a multi-disciplinary discharge planning process, led by the attending physician.  Recommendations may be updated based on patient status, additional functional criteria and insurance authorization.   Follow Up Recommendations  Acute inpatient rehab (3hours/day)    Assistance Recommended at Discharge Frequent or constant Supervision/Assistance  Functional Status Assessment  Patient has had a recent decline in their functional status and demonstrates the ability to make significant improvements in function in a reasonable and predictable amount of time.  Equipment Recommendations  None recommended by OT    Recommendations for Other Services Rehab consult     Precautions / Restrictions Precautions Precautions: Fall Precaution Comments: monitor HR Restrictions Weight Bearing Restrictions: No      Mobility Bed Mobility Overal bed mobility: Needs Assistance Bed Mobility: Supine to Sit;Sit to Supine     Supine to sit: Max assist Sit to supine: Total assist;+2 for physical assistance            Balance Overall balance assessment: Needs  assistance Sitting-balance support: Single extremity supported;Feet supported Sitting balance-Leahy Scale: Poor Sitting balance - Comments: posterior lean and pushing at times       Standing balance comment: unable to safely attempt standing due to decreased sitting balance                           ADL either performed or assessed with clinical judgement   ADL Overall ADL's : Needs assistance/impaired Eating/Feeding: Moderate assistance;Bed level   Grooming: Moderate assistance;Bed level   Upper Body Bathing: Maximal assistance;Bed level   Lower Body Bathing: Total assistance;Bed level   Upper Body Dressing : Total assistance;Bed level   Lower Body Dressing: Total assistance;Bed level                       Vision Baseline Vision/History: 1 Wears glasses Ability to See in Adequate Light: 0 Adequate Patient Visual Report: No change from baseline Additional Comments: continue to assess            Pertinent Vitals/Pain Pain Assessment: 0-10 Faces Pain Scale: Hurts little more Pain Location: Bil hips with knee flexion and rolling in bed Pain Descriptors / Indicators: Aching;Sore Pain Intervention(s): Limited activity within patient's tolerance;Monitored during session;Repositioned     Hand Dominance Right   Extremity/Trunk Assessment Upper Extremity Assessment Upper Extremity Assessment: LUE deficits/detail LUE Deficits / Details: trace AROM noted for shoulder flexion (pt does normally have issues of shoulder due to surgery on it--but can move from his elbow distally well) LUE Sensation: decreased light touch (could tell I was touching him but not consistent on exactly where) LUE  Coordination: decreased fine motor;decreased gross motor           Communication Communication Communication: No difficulties   Cognition Arousal/Alertness: Awake/alert Behavior During Therapy: Flat affect Overall Cognitive Status: Within Functional Limits for tasks  assessed                                                  Home Living Family/patient expects to be discharged to:: [place]: Spouse/significant other;Other relatives Available Help at Discharge: Family;Available 24 hours/day Type of Home: House Home Access: Ramped entrance     Home Layout: Multi-level Alternate Level Stairs-Number of Steps: 1 Alternate Level Stairs-Rails: None Bathroom Shower/Tub: Walk-in shower (small)   Bathroom Toilet: Handicapped height     Home Equipment: Conservation officer, nature (2 wheels);Rollator (4 wheels);Grab bars - tub/shower;BSC/3in1   Additional Comments: doesnt use BSC/3n1 due to opening too small      Prior Functioning/Environment Prior Level of Function : Needs assist       Physical Assist : ADLs (physical)   ADLs (physical): Bathing;Dressing   ADLs Comments: Reports he could bath and dress himself but his son does it for him (it's faster)        OT Problem List: Decreased strength;Decreased range of motion;Impaired balance (sitting and/or standing);Impaired sensation;Decreased cognition;Decreased safety awareness;Impaired UE functional use      OT Treatment/Interventions: Self-care/ADL training;DME and/or AE instruction;Patient/family education;Balance training;Neuromuscular education;Therapeutic activities;Therapeutic exercise;Visual/perceptual remediation/compensation    OT Goals(Current goals can be found in the care plan section) Acute Rehab OT Goals Patient Stated Goal: did not state OT Goal Formulation: With patient/family Time For Goal Achievement: 12/27/20 Potential to Achieve Goals: Good  OT Frequency: Min 2X/week              AM-PAC OT "6 Clicks" Daily Activity     Outcome Measure Help from another person eating meals?: A Lot Help from another person taking care of personal grooming?: A Lot Help from another person toileting, which includes using toliet, bedpan, or  urinal?: Total Help from another person bathing (including washing, rinsing, drying)?: A Lot Help from another person to put on and taking off regular upper body clothing?: Total Help from another person to put on and taking off regular lower body clothing?: Total 6 Click Score: 9   End of Session Nurse Communication:  (called RN to room due to new onset of inabiltiy to move LUE)  Activity Tolerance: Patient tolerated treatment well Patient left: in bed;with call bell/phone within reach;with nursing/sitter in room;with family/visitor present  OT Visit Diagnosis: Unsteadiness on feet (R26.81);Other abnormalities of gait and mobility (R26.89);Muscle weakness (generalized) (M62.81);Hemiplegia and hemiparesis Hemiplegia - Right/Left: Left Hemiplegia - dominant/non-dominant: Non-Dominant Hemiplegia - caused by: Unspecified                Time: 0930-1007 OT Time Calculation (min): 37 min Charges:  OT General Charges $OT Visit: 1 Visit OT Evaluation $OT Eval Moderate Complexity: 1 Mod OT Treatments $Self Care/Home Management : 8-22 mins  Golden Circle, OTR/L Acute [employer] Pager [phone] Office [phone]    Almon Register 12/13/2020, 10:29 AM

## 2020-12-13 NOTE — Consult Note (Signed)
Neurology Consultation  Reason for Consult: Inpatient Code Stroke  Referring Physician: Dr. Criss Alvine  CC: Left sided arm and leg weakness   History is obtained primarily via chart review and discussion with family. Patient is a poor historian.   HPI: [name] is a 87 y.o. 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 LKN at approximately 0930 while RN administering medications. She noted he was moving his left side without difficulty during that time. PT reported new weakness to bedside RN, code stroke was called. Mild left facial droop, mild dysarthria LUE plegia and LLE weakness noted. Patient was taken emergently to CT. No acute finding on HCT and No LVO found on CTA. INR was noted to be 1.6 this morning. Platelet count clumped on last check yesterday with no recent values showing in chart review. Stat Platelet count obtained and discussed urgency for result with lab by phone. Stat platelet count continued to show clumping. Further discussion with family at bedside regarding risk and benefits of TNK in setting of unknown platelet count. They deny being aware of any history of thrombocytopenia. They consented to administer TNK which was administered immediately.   LKW: 0930 TNK given?: Yes  IR Thrombectomy? Not a candidate, No LVO MRS: 3  ROS: Unable to obtain full ROS due to altered mental status. Does endorse back and shoulder pain which he describes as chronic and unchanged.   Past Medical History:  Diagnosis Date   Atrial fibrillation ([place]) 10/15/2012   Cancer ([place])    Skin (R hand)   Embolic stroke ([place]) 62/13/0865   Essential hypertension 01/21/2014   Long term current use of anticoagulant therapy 12/25/2012     Family History  Problem Relation Age of Onset   Other Mother        Brain hemmorrhage   Other Father        unknown   Social History:   reports that he has quit smoking. His smoking use included cigarettes. He has a 2.50 pack-year smoking history. He has never used smokeless tobacco. He reports that he does not drink alcohol and does not use drugs.  Medications  Current Facility-Administered Medications:    acetaminophen (TYLENOL) tablet 650 mg, 650 mg, Oral, Q6H PRN **OR** acetaminophen (TYLENOL) suppository 650 mg, 650 mg, Rectal, Q6H PRN, Marcelyn Bruins, MD   albuterol (VENTOLIN HFA) 108 (90 Base) MCG/ACT inhaler 2 puff, 2 puff, Inhalation, Q4H PRN, Marcelyn Bruins, MD, 2 puff at 12/22/2020 1409   atorvastatin (LIPITOR) tablet 20 mg, 20 mg, Oral, Daily, Branch, Royetta Crochet, MD, 20 mg at 12/13/20 0810   furosemide (LASIX) injection 40 mg, 40 mg, Intravenous, BID, Branch, Royetta Crochet, MD   metoprolol succinate (TOPROL-XL) 24 hr tablet 100 mg, 100 mg, Oral, Daily, Cristescu, Mircea G, MD, 100 mg at 12/13/20 0810   metoprolol tartrate (LOPRESSOR) injection 2.5 mg, 2.5 mg, Intravenous, Q6H PRN, Cristescu, Mircea G, MD, 2.5 mg at 12/13/20 0640   polyethylene glycol (MIRALAX / GLYCOLAX) packet 17 g, 17 g, Oral, Daily PRN, Marcelyn Bruins, MD   sodium chloride flush (NS) 0.9 % injection 3 mL, 3 mL, Intravenous, Q12H, Marcelyn Bruins, MD, 3 mL at 12/13/20 7846   warfarin (COUMADIN) tablet 7.5 mg, 7.5 mg, Oral, ONCE-1600, Einar Grad, [facility]  Warfarin - Pharmacist Dosing Inpatient, , Does not apply, q1600, Marcelyn Bruins, MD   Exam: Current vital signs: BP 131/83 (BP Location: Right Arm)   Pulse (!) 139 Comment: with up to EOB  Temp 98.4 F (36.9 C) (Oral)   Resp (!) 25   Ht 6\' 1"  (1.854 m)   Wt 90.2 kg   SpO2 95%   BMI 26.24 kg/m  Vital signs in last 24 hours: Temp:  [98.2 F (36.8 C)-99 F (37.2 C)] 98.4 F (36.9 C) (12/04 0745) Pulse Rate:  [66-139] 139  (12/04 1019) Resp:  [16-26] 25 (12/04 0745) BP: (108-148)/(61-85) 131/83 (12/04 0745) SpO2:  [91 %-96 %] 95 % (12/04 0745) FiO2 (%):  [40 %] 40 % (12/03 2230) Weight:  [90.2 kg] 90.2 kg (12/04 0344)  GENERAL: well developed elderly male resting quietly in bed with eyes closed in NAD Psych: Patient is calm Head: Normocephalic and atraumatic, without obvious abnormality EENT: Normal conjunctivae, dry mucous membranes, no OP obstruction LUNGS: Normal respiratory effort. Non-labored breathing on oxygen by nasal cannula  CV: Regular rate and rhythm on telemetry ABDOMEN: Soft, non-tender, non-distended Extremities: warm, well perfused, without obvious deformity  NEURO:  Mental Status: Mildly somnolent, hard of hearing, arouses to verbal stimuli. Does not keep eyes open. Follows some commands but responds "I can't" to most.  His mental status is not adequate to provide a clear and coherent history of present illness. Speech/Language: speech is mildly dysarthric.    Naming, repetition are intact. Paucity of speech.  No neglect is noted Cranial Nerves:  II: PERRL 3 mm/brisk. VFF, Tracks examiner across the midline briefly.  III, IV, VI: EOMI. Lid elevation symmetric and full.  V: Sensation is intact to light touch and symmetrical to face. Blinks to threat.  VII: Mild left facial droop VIII: HOH, hearing intact to loud voice IX, X: Palate elevation is symmetric. Phonation normal.  XI: Shoulder pain on left limits exam, intact on right  XII: Tongue protrudes midline without fasciculations.   Motor: LUE plegic, RUE 4+/5, LLE withdraws to pain. RLE at least antigravity.  Tone is normal. Bulk is normal.  Sensation: Intact to light touch bilaterally in face. Impaired in left upper extremity and bilat lower extremities although attention was not adequate for reliable exam.  Coordination: Does not adequately follow commands for assessment.  Gait: Deferred  NIHSS: 1a Level of Conscious. 1 1b LOC  Questions: 1 1c LOC Commands: 0 2 Best Gaze: 0 3 Visual: 0 4 Facial Palsy: 2 5a Motor Arm - left: 4 5b Motor Arm - Right: 0 6a Motor Leg - Left: 4 6b Motor Leg - Right: 3 7 Limb Ataxia: 1 8 Sensory: 2 9 Best Language: 0 10 Dysarthria: 1 11 Extinct. and Inatten.: 0 TOTAL: 19   Labs I have reviewed labs in epic and the results pertinent to this consultation are: Lab Results  Component Value Date   PLT PLATELET CLUMPS NOTED ON SMEAR, UNABLE TO ESTIMATE 12/13/2020    Lab Results  Component Value Date   INR 1.6 (H) 12/13/2020   INR 1.6 (H) 12/12/2020   INR 1.6 (H) 01/07/2021    CBC    Component Value Date/Time   WBC 11.1 (H) 12/13/2020 0200   RBC 4.86 12/13/2020 0200   HGB 13.7 12/13/2020 0200   HCT 43.5 12/13/2020 0200   HCT 37.1 (L) 10/22/2015 0635   PLT PLATELET CLUMPS NOTED ON SMEAR, UNABLE TO ESTIMATE 12/13/2020 0200   MCV 89.5 12/13/2020 0200  MCH 28.2 12/13/2020 0200   MCHC 31.5 12/13/2020 0200   RDW 14.5 12/13/2020 0200   LYMPHSABS 0.9 01/09/2021 1400   MONOABS 0.8 01/01/2021 1400   EOSABS 0.1 01/05/2021 1400   BASOSABS 0.0 12/31/2020 1400    CMP     Component Value Date/Time   NA 137 12/12/2020 1423   K 3.8 12/12/2020 1423   CL 95 (L) 12/12/2020 1423   CO2 33 (H) 12/12/2020 1423   GLUCOSE 87 12/12/2020 1423   BUN 12 12/12/2020 1423   CREATININE 1.17 12/12/2020 1423   CALCIUM 9.0 12/12/2020 1423   PROT 7.9 12/12/2020 0053   ALBUMIN 3.2 (L) 12/12/2020 0053   AST 27 12/12/2020 0053   ALT 19 12/12/2020 0053   ALKPHOS 58 12/12/2020 0053   BILITOT 0.9 12/12/2020 0053   GFRNONAA 60 (L) 12/12/2020 1423   GFRAA 60 (L) 10/03/2017 1022    Lipid Panel     Component Value Date/Time   CHOL 137 10/15/2020 1302   TRIG 87 10/15/2020 1302   HDL 45 10/15/2020 1302   CHOLHDL 3.0 10/15/2020 1302   VLDL 17 10/15/2020 1302   LDLCALC 75 10/15/2020 1302    Imaging I have reviewed the images obtained:  CT-scan of the brain 1. No acute cortically based  infarct or acute intracranial hemorrhage identified. ASPECTS 10. 2. Chronic Left PCA territory infarct and right caudate lacune.  CTA head and neck 1. Negative for large vessel occlusion, and no core infarct or ischemic penumbra detected by CT Perfusion. 2. Atherosclerosis in the head and neck. Up to moderate associated stenosis at the Right Vertebral Artery origin. And moderate irregularity and stenosis Left > Right PCA P2 and distal branches, in the setting of chronic Left PCA infarct. 3. Bilateral upper lung pulmonary opacity suspicious for Bilateral Pneumonia. Consider viral/atypical etiology  Assessment: GIULIANO PREECE is an 87 y.o. male with PMH significant for atrial fibrillation on warfarin therapy, HTN, thrombocytopenia, hyponatremia, HFpEF (EF 60% Oct 2022) currently admitted with CHF exacerbation and pneumonia who developed Left hemiplegia noted while working with PT this morning. PT reported new weakness to bedside RN, code stroke was called. LKN at approximately 0930 while RN administering medications. She noted he was moving his left side without difficulty during that time. Patient was taken emergently to CT. INR was noted to be 1.6 this morning. Platelet count clumped on last check yesterday, stat Platelet count obtained and discussed urgency for result with lab. New platelet count continued to show clumping.  - CT head negative for hemorrhage. Old left PCA stroke is noted.  - NIHSS 19. Examination findings are most consistent with a right MCA territory stroke. Given no perfusion deficit on CTP, the stroke is most likely lacunar.  - CTA of head and neck with no LVO.  - The patient has no absolute contraindications to IV thrombolysis.  - Risks and benefits of TNK administration versus no administration were discussed with family by phone and in person. Unclear additional hemorrhage risk given unknown platelet count due to clumping was thoroughly reviewed with son and family at  bedside in a second discussion. All questions answered. Consent was obtained. TNK was then immediately administered.  Recommendations: - HOLD all anticoagulant and antiplatelet medications until further notice - Post-TNK BP management as per protocol - DVT prophylaxis with SCDs - Obtain stat HCT for any decline in neurologic status.  - Repeat head CT in 24 hours to assess for possible hemorrhagic conversion. If no hemorrhage, can restart anticoagulation.  -  HgbA1c, fasting lipid panel - MRI pending - Repeat HCT in 12 hours, at 12 midnight  - Frequent neuro checks - Echocardiogram already ordered on admission - Risk factor modification - Telemetry monitoring - PT consult, OT consult, Speech consult - Consider Hematology consult for thrombocytopenia, consistent platelet clumping with indeterminate values  - Stroke team to follow  Pt seen and evaluated with Dr. Cheral Marker and Anibal Henderson, NP.    Delila Bailey-Modzik, NP-C   I have seen and examined the patient. I have formulated the assessment and recommendations. 87 year old male admitted for PNA and CHF exacerbation who experienced sudden onset of symptoms this morning referable to the right MCA territory. NIHSS 19. No hemorrhage on CT. Administered TNK after informed consent. No LVO on CTA. Recommendations as above.  Electronically signed: Dr. Kerney Elbe

## 2020-12-14 ENCOUNTER — Inpatient Hospital Stay (HOSPITAL_COMMUNITY): Payer: Medicare Other

## 2020-12-14 DIAGNOSIS — E876 Hypokalemia: Secondary | ICD-10-CM

## 2020-12-14 DIAGNOSIS — I5033 Acute on chronic diastolic (congestive) heart failure: Secondary | ICD-10-CM | POA: Diagnosis present

## 2020-12-14 LAB — BASIC METABOLIC PANEL
Anion gap: 10 (ref 5–15)
BUN: 22 mg/dL (ref 8–23)
CO2: 32 mmol/L (ref 22–32)
Calcium: 8.9 mg/dL (ref 8.9–10.3)
Chloride: 92 mmol/L — ABNORMAL LOW (ref 98–111)
Creatinine, Ser: 1.25 mg/dL — ABNORMAL HIGH (ref 0.61–1.24)
GFR, Estimated: 55 mL/min — ABNORMAL LOW (ref >60)
Glucose, Bld: 93 mg/dL (ref 70–99)
Potassium: 3.4 mmol/L — ABNORMAL LOW (ref 3.5–5.1)
Sodium: 134 mmol/L — ABNORMAL LOW (ref 135–145)

## 2020-12-14 LAB — CBC WITH DIFFERENTIAL/PLATELET
Abs Immature Granulocytes: 0.04 10*3/uL (ref 0.00–0.07)
Basophils Absolute: 0 10*3/uL (ref 0.0–0.1)
Basophils Relative: 0 %
Eosinophils Absolute: 0.1 10*3/uL (ref 0.0–0.5)
Eosinophils Relative: 1 %
HCT: 42.5 % (ref 39.0–52.0)
Hemoglobin: 13.4 g/dL (ref 13.0–17.0)
Immature Granulocytes: 0 %
Lymphocytes Relative: 7 %
Lymphs Abs: 0.7 10*3/uL (ref 0.7–4.0)
MCH: 28.2 pg (ref 26.0–34.0)
MCHC: 31.5 g/dL (ref 30.0–36.0)
MCV: 89.5 fL (ref 80.0–100.0)
Monocytes Absolute: 1 10*3/uL (ref 0.1–1.0)
Monocytes Relative: 10 %
Neutro Abs: 8 10*3/uL — ABNORMAL HIGH (ref 1.7–7.7)
Neutrophils Relative %: 82 %
Platelets: UNDETERMINED 10*3/uL (ref 150–400)
RBC: 4.75 MIL/uL (ref 4.22–5.81)
RDW: 14.6 % (ref 11.5–15.5)
WBC: 9.8 10*3/uL (ref 4.0–10.5)
nRBC: 0 % (ref 0.0–0.2)

## 2020-12-14 LAB — BRAIN NATRIURETIC PEPTIDE: B Natriuretic Peptide: 93.9 pg/mL (ref 0.0–100.0)

## 2020-12-14 LAB — GLUCOSE, CAPILLARY
Glucose-Capillary: 166 mg/dL — ABNORMAL HIGH (ref 70–99)
Glucose-Capillary: 80 mg/dL (ref 70–99)

## 2020-12-14 LAB — PROTIME-INR
INR: 1.9 — ABNORMAL HIGH (ref 0.8–1.2)
Prothrombin Time: 21.9 seconds — ABNORMAL HIGH (ref 11.4–15.2)

## 2020-12-14 MED ORDER — APIXABAN 5 MG PO TABS
5.0000 mg | ORAL_TABLET | Freq: Two times a day (BID) | ORAL | Status: DC
  Administered 2020-12-14 – 2020-12-16 (×5): 5 mg via ORAL
  Filled 2020-12-14 (×5): qty 1

## 2020-12-14 MED ORDER — ONDANSETRON HCL 4 MG/2ML IJ SOLN
4.0000 mg | Freq: Four times a day (QID) | INTRAMUSCULAR | Status: DC | PRN
  Administered 2020-12-14: 4 mg via INTRAVENOUS
  Filled 2020-12-14: qty 2

## 2020-12-14 MED ORDER — POTASSIUM CHLORIDE 10 MEQ/100ML IV SOLN
10.0000 meq | INTRAVENOUS | Status: DC
  Administered 2020-12-14: 10 meq via INTRAVENOUS
  Filled 2020-12-14: qty 100

## 2020-12-14 MED ORDER — POTASSIUM CHLORIDE 20 MEQ PO PACK
40.0000 meq | PACK | Freq: Once | ORAL | Status: AC
  Administered 2020-12-14: 40 meq via ORAL
  Filled 2020-12-14: qty 2

## 2020-12-14 NOTE — Progress Notes (Signed)
PT Cancellation Note  Patient Details Name: [name] MRN: [id number] DOB: [birth year]   Cancelled Treatment:    Reason Eval/Treat Not Completed: Medical issues which prohibited therapy.  Pt actively vomiting.  PT to check back tomorrow.     Barbarann Ehlers [name] 12/14/2020, 7:59 PM

## 2020-12-14 NOTE — Progress Notes (Signed)
Speech Language Pathology Treatment: Dysphagia  Patient Details Name: [name] MRN: [id number] DOB: Sep 24, 1930 Today's Date: 12/14/2020 Time: 2446-2863 SLP Time Calculation (min) (ACUTE ONLY): 18 min  Assessment / Plan / Recommendation Clinical Impression  Pt seen for f/u after swallow eval yesterday. Since evaluation pt has been diagnosed with new right hemisphere CVA. No significant change in oral CN assessment. However, pt did have significant coughing with breakfast meal today with son feeding him. He had late coughing in the meal, particularly noted with the sausage. Pt made NPO. SLP today first administered 3 oz water swallow; pt successful. Placed dentures (which were in place this am) and offered a snack of grahams, applesauce and water. Pt again successful until the end of session where late coughing observed with son feeding him another graham. SLP asked for a rest break, then pt able to resume snack without further coughing. His mastication with large denture in place is a little disorganized. But problem does no appear acute. Pt does confirm a remote history of GER, but nothing of note in pts chart. Recommend pt resume a diet with softer, finely chopped solids and thin liquids. Reviewed precautions with son, particularly avoiding dry meats and taking rest breaks as needed. If pt significantly struggles again with a meal, no not make NPO, but change to a pureed (dys 1) diet. SLP will f/u tomorrow for need for MBS if problems persist.    HPI HPI: Pt is an 87 y.o. male who presented initially  with ongoing shortness of breath. Dx acute enceophalopathy. CXR 12/3: Increased opacity within the right upper lung, suspicious for pneumonia, alternatively asymmetric edema or atelectasis. Pt was evaluated by SLP on 9/4 at 9 am, foudn to be toelrating PO with a possible mild baseline dysphagia. Later that morning a code stroke was called; patient was noted to have left-sided weakness.  He was last  known normal at around 9:30 AM the same day.   Initial CT head was negative however due to impressive stroke findings on clinical exam, patient received TNK, subsequently MRI showed small to moderate size right frontotemporal infarct.  PMH: CHF, atrial fibrillation, venous stasis, hypertension, history of CVA, thrombocytopenia.      SLP Plan  Continue with current plan of care      Recommendations for follow up therapy are one component of a multi-disciplinary discharge planning process, led by the attending physician.  Recommendations may be updated based on patient status, additional functional criteria and insurance authorization.    Recommendations  Diet recommendations: Dysphagia 2 (fine chop);Thin liquid Medication Administration: Whole meds with puree Supervision: Full supervision/cueing for compensatory strategies;Trained caregiver to feed patient Compensations: Slow rate;Small sips/bites Postural Changes and/or Swallow Maneuvers: Seated upright 90 degrees;Upright 30-60 min after meal                General recommendations: Rehab consult Oral Care Recommendations: Oral care BID Follow Up Recommendations: Acute inpatient rehab (3hours/day) Assistance recommended at discharge: Intermittent Supervision/Assistance SLP Visit Diagnosis: Dysphagia, unspecified (R13.10) Plan: Continue with current plan of care       GO               Herbie [place], [place] Office [phone]  Lynann Beaver  12/14/2020, 12:46 PM

## 2020-12-14 NOTE — Progress Notes (Signed)
Pt experiencing difficulties with pocketing more solid food on dys 2 diet. Did well with soft foods like mashed potatoes. Changed diet to dys 1.

## 2020-12-14 NOTE — Progress Notes (Addendum)
PROGRESS NOTE    [name]  IRC:[id number] DOB: 1930-09-04 DOA: 01/04/2021 PCP: Gaynelle Arabian, MD   Brief Narrative:  87 years old male with past medical history of CHF, persistent atrial fibrillation, hypertension, history of CVA, thrombocytopenia, came with a chief complaint of shortness of breath.  The symptoms started 1 week ago. His saturation dropped at home in the 70s. In the emergency room he was found to be in congestive heart failure, with a BNP elevated 231 troponin elevated at 41 trended down to 38 on repeat Respiratory panel negative chest x-ray showed edema Patient received Lasix IV 80 mg, DuoNebs, metoprolol, INR subtherapeutic 1.6. Patient used to take Lasix 40 mg daily BUN and creatinine were normal  Assessment & Plan:   Principal Problem:   CHF exacerbation (HCC) Active Problems:   Atrial fibrillation (HCC)   Hx of Embolic stroke ([place])   Essential hypertension   Chronic venous stasis dermatitis  Acute hypoxic respiratory failure secondary to acute on chronic congestive heart failure with preserved ejection fraction: Still requiring as needed BiPAP and currently 6 L of oxygen.  Feels better.  He still has crackles at the bases bilaterally.  Has reasonable urine output in last 24 hours.  He is net -6 L since admission.  Repeat chest x-ray, if increased vascular congestion, will increase Lasix however in the meantime continue 40 mg IV twice daily with strict I's and O's, low-sodium diet and daily weight.  Bilateral upper lungs obesity: COVID, influenza negative.  Procalcitonin unremarkable.  Lactic acid normal.  No leukocytosis.  He is afebrile.  I personally doubt bacterial pneumonia and thus I will discontinue antibiotics.  Hypokalemia: We will replace.  Permanent/persistent atrial fibrillation: Rate controlled.  Currently on IV metoprolol as needed since he is n.p.o.  We will resume his Toprol-XL once he is able to take p.o.  Coumadin on hold due to receiving  thrombolytics.  Acute encephalopathy: Likely secondary to stroke, he is alert and oriented now.  Acute ischemic stroke: On 12/13/2020, patient was noted to have left-sided weakness.  He was last known normal at around 9:30 AM the same day.  Code stroke was called, neurology was consulted, patient underwent stroke work-up.  Initial CT head was negative however due to impressive stroke findings on clinical exam, patient received TNK, subsequently MRI showed small to moderate size right frontotemporal infarct.  Repeat head CT today does not show any difference.  Patient is still with significant left upper extremity weakness and some weakness in the left lower extremity.  Speech seems to be better per son who is at the bedside.  Per RN, patient did have some problem swallowing this morning, SLP consult has been placed.  PT OT consult has been placed as well.  Will defer to neurology for further management of stroke.  CBC has been showing platelet clumps however this is a chronic issue for this patient as far as October this year and 1 lab in September 2019 as well.  Has chronic thrombocytopenia.  Hyperlipidemia: Continue atorvastatin.  DVT prophylaxis: Place and maintain sequential compression device Start: 12/13/20 1923   Code Status: Full Code  Family Communication: Discussed with son at the bedside  Status is: Inpatient  Remains inpatient appropriate because: Needs further inpatient management of a stroke and CHF.  Estimated body mass index is 25.33 kg/m as calculated from the following:   Height as of this encounter: 6\' 1"  (1.854 m).   Weight as of this encounter: 87.1 kg.  Nutritional  Assessment: Body mass index is 25.33 kg/m.Marland Kitchen Seen by dietician.  I agree with the assessment and plan as outlined below: Nutrition Status:   Skin Assessment: I have examined the patient's skin and I agree with the wound assessment as performed by the wound care RN as outlined below:    Consultants:   Neurology  Procedures:  None  Antimicrobials:  Anti-infectives (From admission, onward)    Start     Dose/Rate Route Frequency Ordered Stop   12/13/20 2215  ceFEPIme (MAXIPIME) 2 g in sodium chloride 0.9 % 100 mL IVPB        2 g 200 mL/hr over 30 Minutes Intravenous Every 12 hours 12/13/20 2121     12/13/20 1930  doxycycline (VIBRAMYCIN) 100 mg in sodium chloride 0.9 % 250 mL IVPB        100 mg 125 mL/hr over 120 Minutes Intravenous Every 12 hours 12/13/20 1835     12/13/20 1230  ceFEPIme (MAXIPIME) 1 g in sodium chloride 0.9 % 100 mL IVPB  Status:  Discontinued        1 g 200 mL/hr over 30 Minutes Intravenous Every 12 hours 12/13/20 1131 12/13/20 2121          Subjective: Seen and examined.  Patient alert and oriented.  Speech comprehensible.  Almost back to baseline according to the son however he still has significant weakness in the left upper extremity and some weakness in the left lower extremity.  Objective: Vitals:   12/14/20 0700 12/14/20 0800 12/14/20 0900 12/14/20 1000  BP: 135/74 122/81 120/80 128/70  Pulse: 99 97 (!) 110 97  Resp: 19 18 (!) 22 20  Temp:  98.3 F (36.8 C)    TempSrc:  Oral    SpO2: 95% 94% 98% 95%  Weight:   87.1 kg   Height:        Intake/Output Summary (Last 24 hours) at 12/14/2020 1151 Last data filed at 12/14/2020 1000 Gross per 24 hour  Intake 1074.33 ml  Output 1955 ml  Net -880.67 ml   Filed Weights   12/12/20 0545 12/13/20 0344 12/14/20 0900  Weight: 92.5 kg 90.2 kg 87.1 kg    Examination:  General exam: Appears calm and comfortable  Respiratory system: Bibasilar crackles. Respiratory effort normal. Cardiovascular system: S1 & S2 heard, RRR. No JVD, murmurs, rubs, gallops or clicks. No pedal edema. Gastrointestinal system: Abdomen is nondistended, soft and nontender. No organomegaly or masses felt. Normal bowel sounds heard. Central nervous system: Alert and oriented.  1/5 power in left upper extremity and 4/5 power in  left lower extremity.  Normal power in right upper and lower extremity. Skin: No rashes, lesions or ulcers   Data Reviewed: I have personally reviewed following labs and imaging studies  CBC: Recent Labs  Lab 12/13/2020 1400 12/12/20 0053 12/13/20 0200 12/13/20 1049 12/13/20 1250 12/14/20 0340  WBC 7.9 6.9 11.1*  --  12.3* 9.8  NEUTROABS 6.0  --   --   --   --  8.0*  HGB 13.9 13.6 13.7  --  13.8 13.4  HCT 43.4 42.3 43.5  --  42.9 42.5  MCV 87.7 88.7 89.5  --  88.5 89.5  PLT PLATELET CLUMPS NOTED ON SMEAR, UNABLE TO ESTIMATE PLATELET CLUMPS NOTED ON SMEAR, UNABLE TO ESTIMATE PLATELET CLUMPS NOTED ON SMEAR, UNABLE TO ESTIMATE PLATELET CLUMPS NOTED ON SMEAR, UNABLE TO ESTIMATE PLATELET CLUMPS NOTED ON SMEAR, UNABLE TO ESTIMATE PLATELET CLUMPS NOTED ON SMEAR, UNABLE TO ESTIMATE   Basic  Metabolic Panel: Recent Labs  Lab 12/23/2020 1550 01/07/2021 2123 12/12/20 0053 12/12/20 1423 12/13/20 1250 12/14/20 0340  NA  --  135 136 137 132* 134*  K  --  3.1* 3.5 3.8 3.5 3.4*  CL  --  93* 93* 95* 91* 92*  CO2  --  34* 36* 33* 32 32  GLUCOSE  --  115* 113* 87 138* 93  BUN  --  11 12 12 19 22   CREATININE  --  1.05 1.03 1.17 1.19 1.25*  CALCIUM  --  8.7* 8.9 9.0 9.0 8.9  MG 1.9  --   --  2.0 2.0  --    GFR: Estimated Creatinine Clearance: 46.2 mL/min (A) (by C-G formula based on SCr of 1.25 mg/dL (H)). Liver Function Tests: Recent Labs  Lab 12/10/2020 1400 12/12/20 0053 12/13/20 1250  AST 23 27 31   ALT 16 19 17   ALKPHOS 55 58 58  BILITOT 0.8 0.9 1.8*  PROT 8.6* 7.9 7.8  ALBUMIN 4.0 3.2* 3.1*   No results for input(s): LIPASE, AMYLASE in the last 168 hours. No results for input(s): AMMONIA in the last 168 hours. Coagulation Profile: Recent Labs  Lab 01/04/2021 1400 12/12/20 0053 12/13/20 0200 12/14/20 0340  INR 1.6* 1.6* 1.6* 1.9*   Cardiac Enzymes: No results for input(s): CKTOTAL, CKMB, CKMBINDEX, TROPONINI in the last 168 hours. BNP (last 3 results) No results for  input(s): PROBNP in the last 8760 hours. HbA1C: No results for input(s): HGBA1C in the last 72 hours. CBG: Recent Labs  Lab 12/13/20 1044 12/14/20 0728  GLUCAP 166* 80   Lipid Profile: No results for input(s): CHOL, HDL, LDLCALC, TRIG, CHOLHDL, LDLDIRECT in the last 72 hours. Thyroid Function Tests: No results for input(s): TSH, T4TOTAL, FREET4, T3FREE, THYROIDAB in the last 72 hours. Anemia Panel: No results for input(s): VITAMINB12, FOLATE, FERRITIN, TIBC, IRON, RETICCTPCT in the last 72 hours. Sepsis Labs: Recent Labs  Lab 12/15/2020 1400 01/08/2021 1550 12/13/20 1250 12/13/20 1506  PROCALCITON  --   --  0.11  --   LATICACIDVEN 1.3 1.4 1.6 1.3    Recent Results (from the past 240 hour(s))  Resp Panel by RT-PCR (Flu A&B, Covid) Nasopharyngeal Swab     Status: None   Collection Time: 01/06/2021  2:00 PM   Specimen: Nasopharyngeal Swab; Nasopharyngeal(NP) swabs in vial transport medium  Result Value Ref Range Status   SARS Coronavirus 2 by RT PCR NEGATIVE NEGATIVE Final    Comment: (NOTE) SARS-CoV-2 target nucleic acids are NOT DETECTED.  The SARS-CoV-2 RNA is generally detectable in upper respiratory specimens during the acute phase of infection. The lowest concentration of SARS-CoV-2 viral copies this assay can detect is 138 copies/mL. A negative result does not preclude SARS-Cov-2 infection and should not be used as the sole basis for treatment or other patient management decisions. A negative result may occur with  improper specimen collection/handling, submission of specimen other than nasopharyngeal swab, presence of viral mutation(s) within the areas targeted by this assay, and inadequate number of viral copies(<138 copies/mL). A negative result must be combined with clinical observations, patient history, and epidemiological information. The expected result is Negative.  Fact Sheet for Patients:  EntrepreneurPulse.com.au  Fact Sheet for  Healthcare Providers:  IncredibleEmployment.be  This test is no t yet approved or cleared by the [place] FDA and  has been authorized for detection and/or diagnosis of SARS-CoV-2 by FDA under an Emergency Use Authorization (EUA). This EUA will remain  in effect (meaning this  test can be used) for the duration of the COVID-19 declaration under Section 564(b)(1) of the Act, 21 U.S.C.section 360bbb-3(b)(1), unless the authorization is terminated  or revoked sooner.       Influenza A by PCR NEGATIVE NEGATIVE Final   Influenza B by PCR NEGATIVE NEGATIVE Final    Comment: (NOTE) The Xpert Xpress SARS-CoV-2/FLU/RSV plus assay is intended as an aid in the diagnosis of influenza from Nasopharyngeal swab specimens and should not be used as a sole basis for treatment. Nasal washings and aspirates are unacceptable for Xpert Xpress SARS-CoV-2/FLU/RSV testing.  Fact Sheet for Patients: EntrepreneurPulse.com.au  Fact Sheet for Healthcare Providers: IncredibleEmployment.be  This test is not yet approved or cleared by the [place] FDA and has been authorized for detection and/or diagnosis of SARS-CoV-2 by FDA under an Emergency Use Authorization (EUA). This EUA will remain in effect (meaning this test can be used) for the duration of the COVID-19 declaration under Section 564(b)(1) of the Act, 21 U.S.C. section 360bbb-3(b)(1), unless the authorization is terminated or revoked.  Performed at [facility], [address] [place], [place] [address]   MRSA Next Gen by PCR, Nasal     Status: None   Collection Time: 12/13/20  3:01 PM   Specimen: Nasal Mucosa; Nasal Swab  Result Value Ref Range Status   MRSA by PCR Next Gen NOT DETECTED NOT DETECTED Final    Comment: (NOTE) The GeneXpert MRSA Assay (FDA approved for NASAL specimens only), is one component of a comprehensive MRSA colonization  surveillance program. It is not intended to diagnose MRSA infection nor to guide or monitor treatment for MRSA infections. Test performance is not FDA approved in patients less than 91 years old. Performed at [facility], [place] [address], [place], [place] [address]       Radiology Studies: DG Chest 1 View  Result Date: 12/13/2020 CLINICAL DATA:  Shortness of breath EXAM: CHEST  1 VIEW COMPARISON:  12/12/2020 FINDINGS: Cardiac shadow is stable. Aortic calcifications are again seen. Elevation of the right hemidiaphragm is noted. Vascular congestion is noted with interstitial edema increased when compared with the prior study. No acute bony abnormality is seen. IMPRESSION: Increased vascular congestion and interstitial edema consistent with CHF. Electronically Signed   By: Inez Catalina M.D.   On: 12/13/2020 20:29   CT HEAD WO CONTRAST (5MM)  Result Date: 12/14/2020 CLINICAL DATA:  Stroke follow-up EXAM: CT HEAD WITHOUT CONTRAST TECHNIQUE: Contiguous axial images were obtained from the base of the skull through the vertex without intravenous contrast. COMPARISON:  Head CT 12/13/2020 Brain MRI 12/13/2020 FINDINGS: Brain: There is no mass, hemorrhage or extra-axial collection. There is generalized atrophy without lobar predilection. Hypodensity of the white matter is most commonly associated with chronic microvascular disease. Old left PCA territory infarct. Vascular: No abnormal hyperdensity of the major intracranial arteries or dural venous sinuses. No intracranial atherosclerosis. Skull: The visualized skull base, calvarium and extracranial soft tissues are normal. Sinuses/Orbits: No fluid levels or advanced mucosal thickening of the visualized paranasal sinuses. No mastoid or middle ear effusion. The orbits are normal. IMPRESSION: 1. No acute intracranial abnormality. 2. Old left PCA territory infarct and findings of chronic microvascular ischemia. Electronically Signed   By: Ulyses Jarred M.D.    On: 12/14/2020 01:33   MR BRAIN WO CONTRAST  Result Date: 12/13/2020 CLINICAL DATA:  Transient ischemic attack (TIA) EXAM: MRI HEAD WITHOUT CONTRAST TECHNIQUE: Multiplanar, multiecho pulse sequences of the brain and surrounding structures were obtained  without intravenous contrast. COMPARISON:  Prior CT imaging FINDINGS: Motion degraded DWI and axial T2 FLAIR sequences were obtained. Patient could not tolerate remainder of the study. Area of reduced diffusion in the right frontoparietal lobes. Prominent ventricles and sulci reflecting parenchymal volume loss. Relative prominence of the ventricles as before. Chronic left occipital lobe infarct. Chronic right caudate infarct. IMPRESSION: Patient could not tolerate study beyond acquired sequences and motion artifact is present. Acute small to moderate size right frontoparietal infarct. Electronically Signed   By: Macy Mis M.D.   On: 12/13/2020 14:54   ECHOCARDIOGRAM COMPLETE  Result Date: 12/13/2020    ECHOCARDIOGRAM REPORT   Patient Name:   CASEN PRYOR Date of Exam: 12/13/2020 Medical Rec #:  [id number]     Height:       73.0 in Accession #:    [id number]    Weight:       198.9 lb Date of Birth:  [birth year]     BSA:          2.146 m Patient Age:    17 years      BP:           132/75 mmHg Patient Gender: M             HR:           101 bpm. Exam Location:  Inpatient Procedure: 2D Echo, Cardiac Doppler and Color Doppler Indications:    Stroke  History:        Patient has prior history of Echocardiogram examinations, most                 recent 10/15/2020. CHF; Arrythmias:Tachycardia and Atrial                 Fibrillation.  Sonographer:    Merrie Roof RDCS Referring Phys: [id number] [place]  1. Left ventricular ejection fraction, by estimation, is 60 to 65%. The left ventricle has normal function. The left ventricle has no regional wall motion abnormalities. There is mild left ventricular hypertrophy of the basal segment. Left  ventricular diastolic parameters are indeterminate.  2. Unable to assess RVSP, IVC not well visualized. Right ventricular systolic function is normal. The right ventricular size is normal.  3. Left atrial size was severely dilated.  4. Right atrial size was moderately dilated.  5. The mitral valve is grossly normal. Mild to moderate mitral valve regurgitation.  6. The aortic valve is calcified. Aortic valve regurgitation is trivial. Aortic valve sclerosis/calcification is present, without any evidence of aortic stenosis.  7. The inferior vena cava not well visualized. Comparison(s): No significant change from prior study. FINDINGS  Left Ventricle: Left ventricular ejection fraction, by estimation, is 60 to 65%. The left ventricle has normal function. The left ventricle has no regional wall motion abnormalities. The left ventricular internal cavity size was normal in size. There is  mild left ventricular hypertrophy of the basal segment. Left ventricular diastolic parameters are indeterminate. Right Ventricle: Unable to assess RVSP, IVC not well visualized. The right ventricular size is normal. No increase in right ventricular wall thickness. Right ventricular systolic function is normal. Left Atrium: Left atrial size was severely dilated. Right Atrium: Right atrial size was moderately dilated. Pericardium: There is no evidence of pericardial effusion. Mitral Valve: The mitral valve is grossly normal. There is moderate calcification of the mitral valve leaflet(s). Mild to moderate mitral valve regurgitation. Tricuspid Valve: The tricuspid valve is normal in structure. Tricuspid valve regurgitation is  mild. Aortic Valve: The aortic valve is calcified. Aortic valve regurgitation is trivial. Aortic valve sclerosis/calcification is present, without any evidence of aortic stenosis. Aortic valve mean gradient measures 3.0 mmHg. Aortic valve peak gradient measures 5.8 mmHg. Aortic valve area, by VTI measures 2.02 cm.  Pulmonic Valve: The pulmonic valve was not well visualized. Pulmonic valve regurgitation is not visualized. Aorta: The aortic root is normal in size and structure. Venous: The inferior vena cava not well visualized. IAS/Shunts: No atrial level shunt detected by color flow Doppler.  LEFT VENTRICLE PLAX 2D LVIDd:         4.40 cm LVIDs:         2.80 cm LV PW:         1.00 cm LV IVS:        1.40 cm LVOT diam:     2.10 cm LV SV:         39 LV SV Index:   18 LVOT Area:     3.46 cm  RIGHT VENTRICLE RV Basal diam:  3.50 cm RV S prime:     11.50 cm/s TAPSE (M-mode): 1.5 cm LEFT ATRIUM           Index        RIGHT ATRIUM           Index LA diam:      5.10 cm 2.38 cm/m   RA Area:     14.10 cm LA Vol (A2C): 53.2 ml 24.79 ml/m  RA Volume:   29.00 ml  13.51 ml/m LA Vol (A4C): 88.9 ml 41.42 ml/m  AORTIC VALVE AV Area (Vmax):    2.44 cm AV Area (Vmean):   2.50 cm AV Area (VTI):     2.02 cm AV Vmax:           120.00 cm/s AV Vmean:          82.700 cm/s AV VTI:            0.194 m AV Peak Grad:      5.8 mmHg AV Mean Grad:      3.0 mmHg LVOT Vmax:         84.40 cm/s LVOT Vmean:        59.700 cm/s LVOT VTI:          0.113 m LVOT/AV VTI ratio: 0.58  AORTA Ao Root diam: 3.60 cm TRICUSPID VALVE TR Peak grad:   24.8 mmHg TR Vmax:        249.00 cm/s  SHUNTS Systemic VTI:  0.11 m Systemic Diam: 2.10 cm Phineas Inches Electronically signed by Phineas Inches Signature Date/Time: 12/13/2020/1:34:27 PM    Final    CT HEAD CODE STROKE WO CONTRAST  Result Date: 12/13/2020 CLINICAL DATA:  Code stroke.  87 year old male. EXAM: CT HEAD WITHOUT CONTRAST TECHNIQUE: Contiguous axial images were obtained from the base of the skull through the vertex without intravenous contrast. COMPARISON:  Head CT 10/14/2020.  Brain MRI 05/05/2006. FINDINGS: Brain: Stable cerebral volume. Nonspecific ventriculomegaly including cavum septum pellucidum. No midline shift, mass effect, or evidence of intracranial mass lesion. No acute intracranial hemorrhage  identified. 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 Chronic right caudate lacunar infarct. Patchy additional bilateral white matter hypodensity. No cortically based acute infarct identified. Vascular: Intracranial artery ectasia. Calcified atherosclerosis at the skull base. No suspicious intracranial vascular hyperdensity. Skull: Motion artifact at the skull base. No acute osseous abnormality identified. Sinuses/Orbits: Visualized paranasal sinuses and mastoids are clear. Other: Slight rightward gaze. No  other acute orbit or scalp soft tissue finding. ASPECTS [facility] Stroke Program Early CT Score) Total score (0-10 with 10 being normal): 10 IMPRESSION: 1. No acute cortically based infarct or acute intracranial hemorrhage identified. ASPECTS 10. 2. Chronic Left PCA territory infarct and right caudate lacune. 3. These results were communicated to Dr. Cheral Marker at 10:50 am on 12/13/2020 by text page via the [facility] messaging system. Electronically Signed   By: Genevie Ann M.D.   On: 12/13/2020 10:51   VAS US CAROTID  Result Date: 12/13/2020 Carotid Arterial Duplex Study Patient Name:  DESMIN DALEO  Date of Exam:   12/12/2020 Medical Rec #: [id number]      Accession #:    [id number] Date of Birth: 09-Feb-1930      Patient Gender: M Patient Age:   45 years Exam Location:  [facility] Procedure:      VAS US CAROTID Referring Phys: Riki Rusk CRISTESCU --------------------------------------------------------------------------------  Indications:       TIA and Altered mental status. Risk Factors:      Hypertension, past history of smoking, prior CVA. Other Factors:     Atrial fibrillation. Limitations        Today's exam was limited due to Altered mental status and                    positioning. Comparison Study:  No prior study on file Performing Technologist: Sharion Dove RVS  Examination Guidelines: A complete evaluation includes B-mode imaging, spectral Doppler, color Doppler, and power Doppler as  needed of all accessible portions of each vessel. Bilateral testing is considered an integral part of a complete examination. Limited examinations for reoccurring indications may be performed as noted.  Right Carotid Findings: +----------+--------+--------+--------+------------------+---------+           PSV cm/sEDV cm/sStenosisPlaque DescriptionComments  +----------+--------+--------+--------+------------------+---------+ CCA Prox  42      14              heterogenous                +----------+--------+--------+--------+------------------+---------+ CCA Distal42      15              heterogenous                +----------+--------+--------+--------+------------------+---------+ ICA Prox  52      18              calcific          Shadowing +----------+--------+--------+--------+------------------+---------+ ICA Distal45      17                                tortuous  +----------+--------+--------+--------+------------------+---------+ ECA       67      2                                           +----------+--------+--------+--------+------------------+---------+ +----------+--------+-------+--------+-------------------+           PSV cm/sEDV cmsDescribeArm Pressure (mmHG) +----------+--------+-------+--------+-------------------+ Subclavian80                                         +----------+--------+-------+--------+-------------------+ +---------+--------+--+--------+-+ VertebralPSV cm/s81EDV cm/s0 +---------+--------+--+--------+-+  Left Carotid Findings: +----------+--------+--------+--------+------------------+--------+  PSV cm/sEDV cm/sStenosisPlaque DescriptionComments +----------+--------+--------+--------+------------------+--------+ CCA Prox  56      12              heterogenous               +----------+--------+--------+--------+------------------+--------+ CCA Distal53      11              heterogenous                +----------+--------+--------+--------+------------------+--------+ ICA Prox  38      10              heterogenous               +----------+--------+--------+--------+------------------+--------+ ICA Distal48      9                                 tortuous +----------+--------+--------+--------+------------------+--------+ ECA       42      10                                         +----------+--------+--------+--------+------------------+--------+ +----------+--------+--------+--------+-------------------+           PSV cm/sEDV cm/sDescribeArm Pressure (mmHG) +----------+--------+--------+--------+-------------------+ Subclavian109                                         +----------+--------+--------+--------+-------------------+ +---------+--------+--------+--------------+ VertebralPSV cm/sEDV cm/sNot identified +---------+--------+--------+--------------+   Summary: Right Carotid: Velocities in the right ICA are consistent with a 1-39% stenosis. Left Carotid: Velocities in the left ICA are consistent with a 1-39% stenosis. Vertebrals:  Right vertebral artery demonstrates antegrade flow. Left vertebral              artery was not visualized. Subclavians: Normal flow hemodynamics were seen in bilateral subclavian              arteries. *See table(s) above for measurements and observations.     Preliminary    CT ANGIO HEAD NECK W WO CM W PERF (CODE STROKE)  Result Date: 12/13/2020 CLINICAL DATA:  87 year old male code stroke presentation. EXAM: CT ANGIOGRAPHY HEAD AND NECK CT PERFUSION BRAIN TECHNIQUE: Multidetector CT imaging of the head and neck was performed using the standard protocol during bolus administration of intravenous contrast. Multiplanar CT image reconstructions and MIPs were obtained to evaluate the vascular anatomy. Carotid stenosis measurements (when applicable) are obtained utilizing NASCET criteria, using the distal internal carotid diameter as  the denominator. Multiphase CT imaging of the brain was performed following IV bolus contrast injection. Subsequent parametric perfusion maps were calculated using RAPID software. CONTRAST:  131mL OMNIPAQUE IOHEXOL 350 MG/ML SOLN COMPARISON:  Plain head CT 1034 hours today. FINDINGS: CT Brain Perfusion Findings: ASPECTS: 10 CBF (<30%) Volume: None Perfusion (Tmax>6.0s) volume: None Mismatch Volume: Not applicable Infarction Location:Not applicable CTA NECK Skeleton: Absent maxillary dentition. Widespread advanced cervical spine degeneration. Mild T3 compression fracture, favor chronic. Upper chest: Patchy and confluent peribronchial and peripheral upper lung opacity bilaterally. No superior mediastinal lymphadenopathy. There are small postinflammatory calcified superior mediastinal nodes. Other neck: Negative. Aortic arch: Calcified aortic atherosclerosis. Three vessel arch configuration. Right carotid system: Mild brachiocephalic and right CCA plaque and tortuosity without stenosis. Calcified plaque at the right ICA origin and bulb with less  than 50 % stenosis with respect to the distal vessel. Mildly tortuous right ICA distal to the bulb. Left carotid system: Tortuous left CCA with mild calcified plaque and no stenosis proximal to the bifurcation. Mild to moderate left ICA origin and bulb calcified plaque but no significant stenosis. Mild tortuosity distal to the bulb. Vertebral arteries: Tortuous proximal subclavian artery with calcified plaque but no significant stenosis. Calcified plaque at the right vertebral artery origin results in mild to moderate stenosis. Patent right vertebral artery to the skull base without additional plaque or stenosis. Tortuous proximal left subclavian artery with calcified plaque and a kinked appearance at the thoracic inlet but no significant stenosis. Normal left vertebral artery origin. Mild left V1 segment tortuosity and plaque without stenosis. Mildly non dominant left  vertebral artery is patent to the skull base. CTA HEAD Posterior circulation: Mildly dominant right V4 segment. Mild V4 calcified plaque without stenosis. Normal PICA origins. Patent vertebrobasilar junction and basilar artery without stenosis. SCA and PCA origins are normal. Right posterior communicating artery is present, the left is diminutive or absent. Right PCA branches are patent with moderate irregularity and stenosis in the P2 segment on series 13, image 24. Left MCA branches are irregular and attenuated distally, in the setting of chronic left PCA infarct. Anterior circulation: Both ICA siphons are patent with tortuosity. Mild bilateral siphon calcified plaque without stenosis. Normal right posterior communicating artery origin. Patent carotid termini. Normal MCA and ACA origins. Mildly dominant right ACA A1. Normal anterior communicating artery. Bilateral ACA branches are within normal limits. Left MCA M1 segment, left MCA bifurcation and branches are within normal limits. Right MCA M1 segment and trifurcation are patent. Right MCA branches are within normal limits. Venous sinuses: Early contrast timing, not evaluated. Anatomic variants: Mildly dominant right vertebral artery. Review of the MIP images confirms the above findings IMPRESSION: 1. Negative for large vessel occlusion, and no core infarct or ischemic penumbra detected by CT Perfusion. 2. Atherosclerosis in the head and neck. Up to moderate associated stenosis at the Right Vertebral Artery origin. And moderate irregularity and stenosis Left > Right PCA P2 and distal branches, in the setting of chronic Left PCA infarct. 3. Bilateral upper lung pulmonary opacity suspicious for Bilateral Pneumonia. Consider viral/atypical etiology. 4. Aortic Atherosclerosis (ICD10-I70.0). Salient findings discussed by telephone with Dr. Cheral Marker on 12/13/2020 at 11:15. Electronically Signed   By: Genevie Ann M.D.   On: 12/13/2020 11:18    Scheduled Meds:  atorvastatin   20 mg Oral Daily   Chlorhexidine Gluconate Cloth  6 each Topical Daily   furosemide  40 mg Intravenous BID   metoprolol succinate  100 mg Oral Daily   sodium chloride flush  3 mL Intravenous Q12H   Continuous Infusions:  sodium chloride Stopped (12/14/20 0957)   ceFEPime (MAXIPIME) IV 2 g (12/14/20 1047)   doxycycline (VIBRAMYCIN) IV Stopped (12/14/20 0849)   potassium chloride       LOS: 3 days   Time spent: 38 minutes   Darliss Cheney, MD [facility]  12/14/2020, 11:51 AM  Please page via [place] and do not message via secure chat for anything urgent. Secure chat can be used for anything non urgent.  How to contact the [facility] Attending or Consulting provider [place] or covering provider during after hours [place], for this patient?  Check the care team in [facility] and look for a) attending/consulting TR[facility] provider listed and b) the [facility] team listed. Page or secure chat [phone]. Log  into [email or web address] and use [place]'s universal password to access. If you do not have the password, please contact the hospital operator. Locate the [facility] provider you are looking for under [facility] and page to a number that you can be directly reached. If you still have difficulty reaching the provider, please page the [facility] (Director on Call) for the Hospitalists listed on amion for assistance.

## 2020-12-14 NOTE — Discharge Instructions (Addendum)

## 2020-12-14 NOTE — Progress Notes (Signed)
[place] for apixaban Indication: atrial fibrillation  Labs: Recent Labs    12/12/20 0053 12/12/20 1423 12/13/20 0200 12/13/20 1049 12/13/20 1250 12/14/20 0340  HGB 13.6  --  13.7  --  13.8 13.4  HCT 42.3  --  43.5  --  42.9 42.5  PLT PLATELET CLUMPS NOTED ON SMEAR, UNABLE TO ESTIMATE  --  PLATELET CLUMPS NOTED ON SMEAR, UNABLE TO ESTIMATE PLATELET CLUMPS NOTED ON SMEAR, UNABLE TO ESTIMATE PLATELET CLUMPS NOTED ON SMEAR, UNABLE TO ESTIMATE PLATELET CLUMPS NOTED ON SMEAR, UNABLE TO ESTIMATE  LABPROT 19.1*  --  19.0*  --   --  21.9*  INR 1.6*  --  1.6*  --   --  1.9*  CREATININE 1.03 1.17  --   --  1.19 1.25*    Assessment: 50 yom with hx of afib on warfarin PTA (INR 1.6 on admit) initially presenting with acute hypoxic respiratory failure due to CHF exacerbation. Patient noted to have acute ischemic stroke on 12/4, s/p TNK on 12/4 at 1144. Pharmacy consulted by Neurology to transition to apixaban now that 24hrs out from receiving TNK. Repeat CT head stable. INR up to 1.9 today (received 1 dose of warfarin inpatient 12/3). SCr up slightly to 1.25, Hg wnl. Hx of chronic TCP noted - plt historically clumped and remain clumped on rechecks, MD aware.  Goal of Therapy:  Stroke prevention Monitor platelets by anticoagulation protocol: Yes   Plan:  Start apixaban 5mg  PO BID today per Neurology Monitor CBC, SCr, closely for s/sx bleeding  Arturo Morton, PharmD, BCPS Clinical Pharmacist 12/14/2020 12:25 PM

## 2020-12-14 NOTE — Progress Notes (Signed)
Pt and pt family member refused BiPAP for the evening. Pt currently sat 98 on 4L [place]. RT will cont to monitor.

## 2020-12-14 NOTE — Progress Notes (Addendum)
STROKE TEAM PROGRESS NOTE   INTERVAL HISTORY Patient is seen in his room with his sons at the bedside.  Yesterday morning, he developed left hemiplegia, left sided facial droop and dysarthria while working with PT.  His last known well time was 0930.  Code stroke was called.  Although patient was taking warfarin, his INR was only 1.6.  His platelet count was unable to be obtained due to clumping.  Risks and benefits were discussed with patient's family and TNK was administered.  Patient seems to be doing better this morning.  Still has significant left upper extremity weakness but is able to lift left leg off the bed.  Dysarthria is improved.  Blood pressure adequately controlled.  Follow-up CT scan from this morning shows no hemorrhage and in fact right frontal stroke is difficult to see.  MRI scan shows small right frontal MCA branch infarct but is limited and complete study was not obtained.  Echocardiogram shows ejection fraction 60 to 65%.  Carotid ultrasound showed no significant extracranial stenosis.  Patient remains in A. fib.  Vitals:   12/14/20 0800 12/14/20 0900 12/14/20 1000 12/14/20 1100  BP: 122/81 120/80 128/70 124/72  Pulse: 97 (!) 110 97 95  Resp: 18 (!) 22 20 (!) 21  Temp: 98.3 F (36.8 C)     TempSrc: Oral     SpO2: 94% 98% 95% 97%  Weight:  87.1 kg    Height:       CBC:  Recent Labs  Lab 12/18/2020 1400 12/12/20 0053 12/13/20 1250 12/14/20 0340  WBC 7.9   < > 12.3* 9.8  NEUTROABS 6.0  --   --  8.0*  HGB 13.9   < > 13.8 13.4  HCT 43.4   < > 42.9 42.5  MCV 87.7   < > 88.5 89.5  PLT PLATELET CLUMPS NOTED ON SMEAR, UNABLE TO ESTIMATE   < > PLATELET CLUMPS NOTED ON SMEAR, UNABLE TO ESTIMATE PLATELET CLUMPS NOTED ON SMEAR, UNABLE TO ESTIMATE   < > = values in this interval not displayed.   Basic Metabolic Panel:  Recent Labs  Lab 12/12/20 1423 12/13/20 1250 12/14/20 0340  NA 137 132* 134*  K 3.8 3.5 3.4*  CL 95* 91* 92*  CO2 33* 32 32  GLUCOSE 87 138* 93  BUN  12 19 22   CREATININE 1.17 1.19 1.25*  CALCIUM 9.0 9.0 8.9  MG 2.0 2.0  --    Lipid Panel: No results for input(s): CHOL, TRIG, HDL, CHOLHDL, VLDL, LDLCALC in the last 168 hours. HgbA1c: No results for input(s): HGBA1C in the last 168 hours. Urine Drug Screen: No results for input(s): LABOPIA, COCAINSCRNUR, LABBENZ, AMPHETMU, THCU, LABBARB in the last 168 hours.  Alcohol Level No results for input(s): ETH in the last 168 hours.  IMAGING   CT HEAD WO CONTRAST (5MM)  Result Date: 12/14/2020 CLINICAL DATA:  Stroke follow-up EXAM: CT HEAD WITHOUT CONTRAST TECHNIQUE: Contiguous axial images were obtained from the base of the skull through the vertex without intravenous contrast. COMPARISON:  Head CT 12/13/2020 Brain MRI 12/13/2020 FINDINGS: Brain: There is no mass, hemorrhage or extra-axial collection. There is generalized atrophy without lobar predilection. Hypodensity of the white matter is most commonly associated with chronic microvascular disease. Old left PCA territory infarct. Vascular: No abnormal hyperdensity of the major intracranial arteries or dural venous sinuses. No intracranial atherosclerosis. Skull: The visualized skull base, calvarium and extracranial soft tissues are normal. Sinuses/Orbits: No fluid levels or advanced mucosal thickening of the  visualized paranasal sinuses. No mastoid or middle ear effusion. The orbits are normal. IMPRESSION: 1. No acute intracranial abnormality. 2. Old left PCA territory infarct and findings of chronic microvascular ischemia. Electronically Signed   By: Ulyses Jarred M.D.   On: 12/14/2020 01:33   MR BRAIN WO CONTRAST  Result Date: 12/13/2020 CLINICAL DATA:  Transient ischemic attack (TIA) EXAM: MRI HEAD WITHOUT CONTRAST TECHNIQUE: Multiplanar, multiecho pulse sequences of the brain and surrounding structures were obtained without intravenous contrast. COMPARISON:  Prior CT imaging FINDINGS: Motion degraded DWI and axial T2 FLAIR sequences were obtained.  Patient could not tolerate remainder of the study. Area of reduced diffusion in the right frontoparietal lobes. Prominent ventricles and sulci reflecting parenchymal volume loss. Relative prominence of the ventricles as before. Chronic left occipital lobe infarct. Chronic right caudate infarct. IMPRESSION: Patient could not tolerate study beyond acquired sequences and motion artifact is present. Acute small to moderate size right frontoparietal infarct. Electronically Signed   By: Macy Mis M.D.   On: 12/13/2020 14:54   ECHOCARDIOGRAM COMPLETE  Result Date: 12/13/2020    ECHOCARDIOGRAM REPORT   Patient Name:   [name] Date of Exam: 12/13/2020 Medical Rec #:  [id number]     Height:       73.0 in Accession #:    [id number]    Weight:       198.9 lb Date of Birth:  1930/11/11     BSA:          2.146 m Patient Age:    87 years      BP:           132/75 mmHg Patient Gender: M             HR:           101 bpm. Exam Location:  Inpatient Procedure: 2D Echo, Cardiac Doppler and Color Doppler Indications:    Stroke  History:        Patient has prior history of Echocardiogram examinations, most                 recent 10/15/2020. CHF; Arrythmias:Tachycardia and Atrial                 Fibrillation.  Sonographer:    Merrie Roof RDCS Referring Phys: [id number] [place]  1. Left ventricular ejection fraction, by estimation, is 60 to 65%. The left ventricle has normal function. The left ventricle has no regional wall motion abnormalities. There is mild left ventricular hypertrophy of the basal segment. Left ventricular diastolic parameters are indeterminate.  2. Unable to assess RVSP, IVC not well visualized. Right ventricular systolic function is normal. The right ventricular size is normal.  3. Left atrial size was severely dilated.  4. Right atrial size was moderately dilated.  5. The mitral valve is grossly normal. Mild to moderate mitral valve regurgitation.  6. The aortic valve is calcified.  Aortic valve regurgitation is trivial. Aortic valve sclerosis/calcification is present, without any evidence of aortic stenosis.  7. The inferior vena cava not well visualized. Comparison(s): No significant change from prior study. FINDINGS  Left Ventricle: Left ventricular ejection fraction, by estimation, is 60 to 65%. The left ventricle has normal function. The left ventricle has no regional wall motion abnormalities. The left ventricular internal cavity size was normal in size. There is  mild left ventricular hypertrophy of the basal segment. Left ventricular diastolic parameters are indeterminate. Right Ventricle: Unable to assess RVSP, IVC not  well visualized. The right ventricular size is normal. No increase in right ventricular wall thickness. Right ventricular systolic function is normal. Left Atrium: Left atrial size was severely dilated. Right Atrium: Right atrial size was moderately dilated. Pericardium: There is no evidence of pericardial effusion. Mitral Valve: The mitral valve is grossly normal. There is moderate calcification of the mitral valve leaflet(s). Mild to moderate mitral valve regurgitation. Tricuspid Valve: The tricuspid valve is normal in structure. Tricuspid valve regurgitation is mild. Aortic Valve: The aortic valve is calcified. Aortic valve regurgitation is trivial. Aortic valve sclerosis/calcification is present, without any evidence of aortic stenosis. Aortic valve mean gradient measures 3.0 mmHg. Aortic valve peak gradient measures 5.8 mmHg. Aortic valve area, by VTI measures 2.02 cm. Pulmonic Valve: The pulmonic valve was not well visualized. Pulmonic valve regurgitation is not visualized. Aorta: The aortic root is normal in size and structure. Venous: The inferior vena cava not well visualized. IAS/Shunts: No atrial level shunt detected by color flow Doppler.  LEFT VENTRICLE PLAX 2D LVIDd:         4.40 cm LVIDs:         2.80 cm LV PW:         1.00 cm LV IVS:        1.40 cm LVOT  diam:     2.10 cm LV SV:         39 LV SV Index:   18 LVOT Area:     3.46 cm  RIGHT VENTRICLE RV Basal diam:  3.50 cm RV S prime:     11.50 cm/s TAPSE (M-mode): 1.5 cm LEFT ATRIUM           Index        RIGHT ATRIUM           Index LA diam:      5.10 cm 2.38 cm/m   RA Area:     14.10 cm LA Vol (A2C): 53.2 ml 24.79 ml/m  RA Volume:   29.00 ml  13.51 ml/m LA Vol (A4C): 88.9 ml 41.42 ml/m  AORTIC VALVE AV Area (Vmax):    2.44 cm AV Area (Vmean):   2.50 cm AV Area (VTI):     2.02 cm AV Vmax:           120.00 cm/s AV Vmean:          82.700 cm/s AV VTI:            0.194 m AV Peak Grad:      5.8 mmHg AV Mean Grad:      3.0 mmHg LVOT Vmax:         84.40 cm/s LVOT Vmean:        59.700 cm/s LVOT VTI:          0.113 m LVOT/AV VTI ratio: 0.58  AORTA Ao Root diam: 3.60 cm TRICUSPID VALVE TR Peak grad:   24.8 mmHg TR Vmax:        249.00 cm/s  SHUNTS Systemic VTI:  0.11 m Systemic Diam: 2.10 cm Phineas Inches Electronically signed by Phineas Inches Signature Date/Time: 12/13/2020/1:34:27 PM    Final     PHYSICAL EXAM General: Well-developed, well-nourished elderly Caucasian male patient in no acute distress   NEURO:  Mental Status: AA&Ox2 Speech/Language: Difficult to assess due to patient being hard of hearing.  Some dysarthria present and repetition is impaired  Cranial Nerves:  II: PERRL. Visual fields full.  III, IV, VI: EOMI. Eyelids elevate symmetrically.  V: Sensation is  intact to light touch and symmetrical to face.  VII: Left facial droop present   VIII: hearing intact to voice. IX, X: Phonation is normal.  XII: tongue is midline without fasciculations. Motor: 5/5 strength to RUE, 3/5 to RLE but effort is poor., 2/5 to LUE and 4/5 to LLE.  Significant weakness of left grip. Sensation- Intact to light touch bilaterally. Extinction absent to light touch to DSS.   Coordination: FTN unable to perform on left  Gait- deferred    ASSESSMENT/PLAN [name] is a 87 y.o. male with history of  atrial fibrillation on warfarin, HTN, thrombocytopenia and HFpEF who was currently admitted for CHF exacerbation presenting as a code stroke with left hemiplegia, left sided facial droop and dysarthria.  His last known well time was 0930 on 12/4.  Code stroke was called.  Although patient was taking warfarin, his INR was only 1.6.  His platelet count was unable to be obtained due to clumping.  Risks and benefits were discussed with patient's family and TNK was administered.  Patient was admitted to the ICU post TNK.  Will start Eliquis now that patient is 24 hours out from TNK.  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 ASPECTS 10.    CTA head & neck no LVO, atherosclerosis in head and neck with moderate right vertebral artery origin stenosis CT perfusion no core infarct or penumbra seen MRI  right frontoparietal infarct.  Study limited due to patient tolerance 2D Echo EF 60-65%, no atrial level shunt LDL 75 HgbA1c pending VTE prophylaxis - SCDs    Diet   Diet NPO time specified   warfarin daily prior to admission, now on Eliquis (apixaban) daily.  Therapy recommendations:  pending Disposition:  pending  Hypertension Home meds:  none Stable Keep SBP <160 Long-term BP goal normotensive  Hyperlipidemia Home meds:  none LDL pending, goal < 70 Add statin if needed  Continue statin at discharge if indicated  Risk for Diabetes type II  Home meds:  none HgbA1c No results found for requested labs within last 26280 hours., goal < 7.0 CBGs Recent Labs    12/13/20 1044 12/14/20 0728  GLUCAP 166* 80    SSI  Atrial fibrillation Patient had been taking warfarin long term, but INR was subtherapeutic Will change anticoagulation to Eliquis  CHF Patient is currently admitted for CHF exacerbation Management per  primary team  Other Stroke Risk Factors Advanced Age >/= 66  Former cigarette smoker Hx stroke Congestive heart failure  Other Active Problems Thrombocytopenia- platelet count unable to be measured due to clumping.  Will check daily CBC  Hospital day # [place] , MSN, AGACNP-BC [facility] See Amion for schedule and pager information 12/14/2020 12:29 PM   STROKE MD NOTE : I have personally obtained history,examined this patient, reviewed notes, independently viewed imaging studies, participated in medical decision making and plan of care.ROS completed by me personally and pertinent positives fully documented  I have made any additions or clarifications directly to the above note. Agree with note above.  Patient presented with sudden onset of left hemiplegia and was treated with IV TN K with some improvement.  MRI scan shows small right MCA branch infarct likely embolic from atrial fibrillation despite being on anticoagulation with warfarin with INR being suboptimal at  1.6.  Recommend switch warfarin to Eliquis for anticoagulation for stroke prevention.  Discussed risk benefit with patient and his 2 sons at the bedside and answered questions.  They are in agreement with plan.  Mobilize out of bed.  Therapy consults.  Transfer to neurology floor bed.  He would likely need transfer to rehab next few days after insurance approval and bed availability.  Maintain strict control of blood pressure with as per post thrombolytic protocol and close neurological monitoring. This patient is critically ill and at significant risk of neurological worsening, death and care requires constant monitoring of vital signs, hemodynamics,respiratory and cardiac monitoring, extensive review of multiple databases, frequent neurological assessment, discussion with family, other specialists and medical decision making of high complexity.I have made any additions or clarifications directly to the  above note.This critical care time does not reflect procedure time, or teaching time or supervisory time of PA/NP/Med Resident etc but could involve care discussion time.  I spent 30 minutes of neurocritical care time  in the care of  this patient.     Antony Contras, MD Medical Director [place] Pager: [phone] 12/14/2020 2:18 PM    To contact Stroke Continuity provider, please refer to [email or web address] After hours, contact General Neurology

## 2020-12-15 ENCOUNTER — Inpatient Hospital Stay (HOSPITAL_COMMUNITY): Payer: Medicare Other

## 2020-12-15 DIAGNOSIS — R531 Weakness: Secondary | ICD-10-CM

## 2020-12-15 DIAGNOSIS — Z66 Do not resuscitate: Secondary | ICD-10-CM

## 2020-12-15 DIAGNOSIS — E876 Hypokalemia: Secondary | ICD-10-CM

## 2020-12-15 DIAGNOSIS — Z789 Other specified health status: Secondary | ICD-10-CM

## 2020-12-15 DIAGNOSIS — I509 Heart failure, unspecified: Secondary | ICD-10-CM

## 2020-12-15 DIAGNOSIS — Z7189 Other specified counseling: Secondary | ICD-10-CM

## 2020-12-15 DIAGNOSIS — R0602 Shortness of breath: Secondary | ICD-10-CM

## 2020-12-15 DIAGNOSIS — Z515 Encounter for palliative care: Secondary | ICD-10-CM

## 2020-12-15 LAB — HEMOGLOBIN A1C
Hgb A1c MFr Bld: 6.4 % — ABNORMAL HIGH (ref 4.8–5.6)
Mean Plasma Glucose: 136.98 mg/dL

## 2020-12-15 LAB — BASIC METABOLIC PANEL
Anion gap: 13 (ref 5–15)
BUN: 24 mg/dL — ABNORMAL HIGH (ref 8–23)
CO2: 27 mmol/L (ref 22–32)
Calcium: 8.9 mg/dL (ref 8.9–10.3)
Chloride: 89 mmol/L — ABNORMAL LOW (ref 98–111)
Creatinine, Ser: 1.22 mg/dL (ref 0.61–1.24)
GFR, Estimated: 57 mL/min — ABNORMAL LOW (ref >60)
Glucose, Bld: 188 mg/dL — ABNORMAL HIGH (ref 70–99)
Potassium: 4.1 mmol/L (ref 3.5–5.1)
Sodium: 129 mmol/L — ABNORMAL LOW (ref 135–145)

## 2020-12-15 LAB — LIPID PANEL
Cholesterol: 114 mg/dL (ref 0–200)
HDL: 46 mg/dL (ref >40)
LDL Cholesterol: 55 mg/dL (ref 0–99)
Total CHOL/HDL Ratio: 2.5 RATIO
Triglycerides: 65 mg/dL (ref <150)
VLDL: 13 mg/dL (ref 0–40)

## 2020-12-15 LAB — PROTIME-INR
INR: 2 — ABNORMAL HIGH (ref 0.8–1.2)
Prothrombin Time: 22.8 seconds — ABNORMAL HIGH (ref 11.4–15.2)

## 2020-12-15 MED ORDER — METOPROLOL TARTRATE 5 MG/5ML IV SOLN
2.5000 mg | Freq: Once | INTRAVENOUS | Status: AC
  Administered 2020-12-15: 2.5 mg via INTRAVENOUS

## 2020-12-15 MED ORDER — FUROSEMIDE 10 MG/ML IJ SOLN
60.0000 mg | Freq: Two times a day (BID) | INTRAMUSCULAR | Status: DC
  Administered 2020-12-15 – 2020-12-16 (×3): 60 mg via INTRAVENOUS
  Filled 2020-12-15 (×3): qty 8

## 2020-12-15 MED ORDER — DILTIAZEM HCL 25 MG/5ML IV SOLN
10.0000 mg | Freq: Once | INTRAVENOUS | Status: AC
  Administered 2020-12-15: 10 mg via INTRAVENOUS
  Filled 2020-12-15: qty 5

## 2020-12-15 MED ORDER — DILTIAZEM HCL 30 MG PO TABS
30.0000 mg | ORAL_TABLET | Freq: Three times a day (TID) | ORAL | Status: DC
  Administered 2020-12-15 – 2020-12-16 (×3): 30 mg via ORAL
  Filled 2020-12-15 (×3): qty 1

## 2020-12-15 NOTE — Consult Note (Signed)
Consultation Note Date: 12/15/2020   Patient Name: [name]  DOB: Dec 08, 1930  MRN: [id number]  Age / Sex: 87 y.o., male  PCP: Gaynelle Arabian, MD Referring Physician: Darliss Cheney, MD  Reason for Consultation: Establishing goals of care  HPI/Patient Profile: 87 y.o. male  with past medical history of CHF, atrial fibrillation, venous stasis, hypertension, history of CVA, thrombocytopenia presented to [place] ED on 12/14/2020 from home with complaints of hypoxia and shortness of breath. Patient was admitted to [facility] on 01/02/2021 with acute on chronic CHF exacerbation, acute hypoxic respiratory failure, and possible pneumonia. Cardiology was consulted - recommended diuresing as kidneys tolerated. During admission on 12/13/20 while working with PT, code stroke was initiated due to noted mild left facial droop, mild dysarthria, LUE plegia, and LLE weakness which was far from baseline. Patient was evaluated by Neurology - stat head CT was unremarkable; however, examination findings were most consistent with a right MCA territory stroke - TNK was administered.   Clinical Assessment and Goals of Care: I have reviewed medical records including EPIC notes, labs, and imaging. Received report from primary RN - no acute concerns. RN reports patient is lethargic, eating/drinking about 50% of meals.  Went to visit patient at bedside - son/[name] was present. Patient was lying in bed awake, alert, and able to participate in conversation; however, he quickly fell asleep and was not awake for majority of visit.. No signs or non-verbal gestures of pain or discomfort noted. No respiratory distress, increased work of breathing, or secretions noted. He is on 5L O2 [place]. He denies pain or shortness of breath.  Other son/Danny called and I was able to speak with him briefly via speakerphone and with [name] in person.  I introduced  Palliative Medicine as specialized medical care for people living with serious illness. It focuses on providing relief from the symptoms and stress of a serious illness. The goal is to improve quality of life for both the patient and the family.  We discussed a brief life review of the patient as well as functional and nutritional status. Patient is married with 3 sons. Prior to hospitalization, patient was living in a private residence with his wife. At home he was able to ambulate independently and bathe himself; he did need assistance dressing. Family started to notice a decline last Friday - he "didn't feel good and was weak," which prompted them to take patient to the ED.   We discussed patient's current illness and what it means in the larger context of patient's on-going co-morbidities. Family have a clear understanding of patient's current medical situation.   Advance directives were considered and discussed. Patient does not have a Living Will or HCPOA. [name] states that patient's wife is primary Media planner with assistance mostly from other son/Danny (but all sons are updated/involved with decisions).   [name] tells me patient's wife will be visit with patient tomorrow - scheduled in person meeting with wife, [name], and [name] for tomorrow at [employer].  Discussed with patient/family the importance  of continued conversation with each other and the medical providers regarding overall plan of care and treatment options, ensuring decisions are within the context of the patient's values and GOCs.    Questions and concerns were addressed. The patient/family was encouraged to call with questions and/or concerns. PMT card was provided.  Primary Decision Maker: NEXT OF KIN - wife/[name] with assistance from son/[name] current full scope treatment Continue DNR/DNI as previously documented Scheduled in-person [place] meeting with patient's wife and sons  for tomorrow 12/7 at 4:00p PMT will continue to follow and support holistically  Code Status/Advance Care Planning: DNR   Palliative Prophylaxis:  Aspiration, Bowel Regimen, Delirium Protocol, Frequent Pain Assessment, Oral Care, and Turn Reposition  Additional Recommendations (Limitations, Scope, Preferences): Full Scope Treatment and No Tracheostomy  Psycho-social/Spiritual:  Created space and opportunity for patient and family to express thoughts and feelings regarding patient's current medical situation.  Emotional support and therapeutic listening provided.  Prognosis:  Unable to determine  Discharge Planning: To Be Determined      Primary Diagnoses: Present on Admission:  Atrial fibrillation ([place])  Essential hypertension  Hx of Embolic stroke ([place])  Chronic venous stasis dermatitis  Acute on chronic diastolic CHF (congestive heart failure) ([place])   I have reviewed the medical record, interviewed the patient and family, and examined the patient. The following aspects are pertinent.  Past Medical History:  Diagnosis Date   Atrial fibrillation ([place]) 10/15/2012   Cancer ([place])    Skin (R hand)   Embolic stroke ([place]) 13/08/6576   Essential hypertension 01/21/2014   Long term current use of anticoagulant therapy 12/25/2012   Social History   Socioeconomic History   Marital status: Married    Spouse name: Not on file   Number of children: Not on file   Years of education: Not on file   Highest education level: Not on file  Occupational History   Not on file  Tobacco Use   Smoking status: Former    Packs/day: 0.25    Years: 10.00    Pack years: 2.50    Types: Cigarettes   Smokeless tobacco: Never  Vaping Use   Vaping Use: Never used  Substance and Sexual Activity   Alcohol use: No    Alcohol/week: 0.0 standard drinks   Drug use: No   Sexual activity: Not on file  Other Topics Concern   Not on file  Social History Narrative   Not on file   Social  Determinants of Health   Financial Resource Strain: Not on file  Food Insecurity: Not on file  Transportation Needs: Not on file  Physical Activity: Not on file  Stress: Not on file  Social Connections: Not on file   Family History  Problem Relation Age of Onset   Other Mother        Brain hemmorrhage   Other Father        unknown   Scheduled Meds:  apixaban  5 mg Oral BID   atorvastatin  20 mg Oral Daily   Chlorhexidine Gluconate Cloth  6 each Topical Daily   diltiazem  30 mg Oral Q8H   furosemide  60 mg Intravenous BID   metoprolol succinate  100 mg Oral Daily   sodium chloride flush  3 mL Intravenous Q12H   Continuous Infusions:  sodium chloride Stopped (12/14/20 1151)   doxycycline (VIBRAMYCIN) IV 100 mg (12/15/20 1016)   PRN Meds:.sodium chloride, acetaminophen **OR**  acetaminophen, albuterol, metoprolol tartrate, metoprolol tartrate, ondansetron (ZOFRAN) IV, polyethylene glycol Medications Prior to Admission:  Prior to Admission medications   Medication Sig Start Date End Date Taking? Authorizing Provider  acetaminophen (TYLENOL) 500 MG tablet Take 500-1,000 mg by mouth every 6 (six) hours as needed (FOR PAIN.).   Yes [provider]  albuterol (VENTOLIN HFA) 108 (90 Base) MCG/ACT inhaler Inhale 1 puff into the lungs every 4 (four) hours as needed for wheezing or shortness of breath. 07/02/20  Yes [provider]  aspirin EC 81 MG tablet Take 81 mg by mouth at bedtime.   Yes [provider]  calcium carbonate (OS-CAL) 600 MG TABS tablet Take 600 mg by mouth daily.    Yes [provider]  furosemide (LASIX) 40 MG tablet Take 1 tablet (40 mg total) by mouth daily. 10/23/20  Yes Little Ishikawa, MD  Glucosamine HCl (GLUCOSAMINE PO) Take 1,500 mg by mouth daily.    Yes [provider]  Glucosamine Sulfate 500 MG TABS Take 500 mg by mouth daily.   Yes [provider]  metoprolol succinate (TOPROL-XL) 100 MG 24 hr  tablet Take 1 tablet (100 mg total) by mouth daily. Take with or immediately following a meal. 10/23/20  Yes Little Ishikawa, MD  Misc Natural Products (GLUCOSAMINE CHOND COMPLEX/MSM) TABS Take 1 tablet by mouth daily. Glucosamine HCl Chondroitin MSM Complex 1500-1103 mg   Yes [provider]  Multiple Vitamin (MULTIVITAMIN) tablet Take 1 tablet by mouth daily.   Yes [provider]  Multiple Vitamins-Minerals (PRESERVISION AREDS 2) CAPS Take 1 capsule by mouth 2 (two) times daily.   Yes [provider]  warfarin (COUMADIN) 3 MG tablet Take as directed by coumadin dept 11/04/20  Yes [provider]  traMADol (ULTRAM) 50 MG tablet Take 1 tablet (50 mg total) by mouth every 6 (six) hours as needed. Patient not taking: Reported on 12/13/2020 10/22/20   Little Ishikawa, MD  warfarin (COUMADIN) 1 MG tablet Take as directed by the coumadin dept Patient not taking: Reported on 12/13/2020 11/05/20   [provider]  warfarin (COUMADIN) 5 MG tablet TAKE AS DIRECTED PER ANTICOAGULATION CLINIC Patient not taking: Reported on 12/13/2020 09/15/20   Leanor Kail, PA   No Known Allergies Review of Systems  Unable to perform ROS: Acuity of condition   Physical Exam Vitals and nursing note reviewed.  Constitutional:      General: He is not in acute distress.    Appearance: He is ill-appearing.  Pulmonary:     Effort: No respiratory distress.  Skin:    General: Skin is warm and dry.  Neurological:     Mental Status: He is lethargic.     Motor: Weakness present.  Psychiatric:        Attention and Perception: Attention normal.        Behavior: Behavior is cooperative.    Vital Signs: BP 131/88   Pulse (!) 110   Temp 99.1 F (37.3 C) (Oral)   Resp (!) 25   Ht 6\' 1"  (1.854 m)   Wt 87.1 kg   SpO2 93%   BMI 25.33 kg/m  Pain Scale: Faces   Pain Score: 0-No pain   SpO2: SpO2: 93 % O2 Device:SpO2: 93 % O2 Flow Rate: .O2 Flow Rate  (L/min): 6 L/min  IO: Intake/output summary:  Intake/Output Summary (Last 24 hours) at 12/15/2020 1550 Last data filed at 12/15/2020 1300 Gross per 24 hour  Intake 240 ml  Output 2350 ml  Net -2110 ml    LBM: Last BM Date:  (prior ro arrival) Baseline Weight: Weight: 91.5 kg Most recent weight: Weight: 87.1 kg     Palliative Assessment/Data: PPS 30%     Time In: 1400 Time Out: 1455 Time Total: 55 minutes  Greater than 50%  of this time was spent counseling and coordinating care related to the above assessment and plan.  Signed by: Lin Landsman, NP   Please contact Palliative Medicine Team phone at [phone] for questions and concerns.  For individual provider: See Shea Evans

## 2020-12-15 NOTE — Progress Notes (Signed)
Occupational Therapy Treatment Patient Details Name: [name] MRN: [id number] DOB: 03-28-1930 Today's Date: 12/15/2020   History of present illness 87 yo male presenting after a fall at home resulting in pain in tailbone and hematuria. Upon work up pt found to have acute resp failure with hypoxia due to CHF exacerbation and AMS. MRI 12/4 showed small to moderate size right frontotemporal infarct. PMH includes: afib on anticoagulation, embolic CVA, HTN, and venous stasis ulcers.   OT comments  [name] is incrementally progressing towards his acute goals. Session spent co-treating with PT to progress pt's sitting tolerance. Overall he required total A +2 for bed mobility but tolerated the change of position well, no adverse signs/symptoms. He sat ~15 minutes with fluctuating assistance levels as well as laterally weight shifting to each UE. He required frequent verbal cues throughout to maintain alertness and to attend to task. Although he was drowsy, pt cracking jokes and carrying conversation. He will continue to benefit from OT acutely. D/c recommendation updated to SNF due to pt's required level assist and rehab potential.    Recommendations for follow up therapy are one component of a multi-disciplinary discharge planning process, led by the attending physician.  Recommendations may be updated based on patient status, additional functional criteria and insurance authorization.    Follow Up Recommendations  Skilled nursing-short term rehab (<3 hours/day)    Assistance Recommended at Discharge    Equipment Recommendations  None recommended by OT       Precautions / Restrictions Precautions Precautions: Fall Precaution Comments: L hemiparesis UE>LE Restrictions Weight Bearing Restrictions: No       Mobility Bed Mobility Overal bed mobility: Needs Assistance Bed Mobility: Supine to Sit;Sit to Supine     Supine to sit: Total assist;+2 for physical assistance Sit to supine: Total  assist;+2 for physical assistance   General bed mobility comments: total +2 for all aspects, pt initiating LEs translation to EOB but able to do very little    Transfers                   General transfer comment: unable given fatigue with EOB activity     Balance Overall balance assessment: Needs assistance Sitting-balance support: Single extremity supported;Feet supported Sitting balance-Leahy Scale: Poor Sitting balance - Comments: posterior leaning, max truncal assist to maintain upright. Propping bilat elbows with max truncal and LE assist to maintain Postural control: Posterior lean;Left lateral lean     Standing balance comment: unable to safely attempt                           ADL either performed or assessed with clinical judgement   ADL Overall ADL's : Needs assistance/impaired     Grooming: Maximal assistance;Sitting Grooming Details (indicate cue type and reason): washing head with wet cloth, max A for sitting balance and RUE support                             Functional mobility during ADLs: Maximal assistance;+2 for physical assistance;+2 for safety/equipment;Total assistance (at bed level) General ADL Comments: Session focused on sitting balance and weight bearing, shifting and ocrrecting midline posture. max/total A +2 for all tasks. brief moments of close min guard for sitting balacne    Extremity/Trunk Assessment Upper Extremity Assessment Upper Extremity Assessment: RUE deficits/detail;LUE deficits/detail RUE Deficits / Details: globally weak, unable to reach over head. Difficult to fully assess  RUE Coordination: decreased fine motor;decreased gross motor LUE Deficits / Details: trace AROM noted in hand, otherwise flaccid. Pt able to weightbear through arm in sitting lateral lean LUE Sensation: decreased light touch;decreased proprioception LUE Coordination: decreased fine motor;decreased gross motor   Lower Extremity  Assessment Lower Extremity Assessment: Defer to PT evaluation RLE Deficits / Details: contraction only knee flexion, knee extension, hip flexion RLE Coordination: decreased fine motor;decreased gross motor   Cervical / Trunk Assessment Cervical / Trunk Assessment: Kyphotic    Vision   Vision Assessment?: Vision impaired- to be further tested in functional context Additional Comments: pt attending equally in all fields however difficult to fully assess due to lethargy and pt not sustaining attention to task   Perception Perception Perception: Not tested   Praxis Praxis Praxis: Not tested    Cognition Arousal/Alertness: Lethargic Behavior During Therapy: Flat affect Overall Cognitive Status: Impaired/Different from baseline Area of Impairment: Orientation                 Orientation Level: Disoriented to;Place;Person;Situation             General Comments: Pt states name, incorrectly states year he was born in, states "I am here" when asked where he is, and PT explains to pt he had a CVA. saying yes/shaking head appropriately throughout session. Stated his son's full name upon arrival          Exercises Other Exercises Other Exercises: EOB x15 minutes, lateral elbow propping bilat, AP leaning, using RUE to push/pull to upright sitting, correcting L lateral leaning with truncal and pelvic inputs   Shoulder Instructions       General Comments in afib rhythm    Pertinent Vitals/ Pain       Pain Assessment: Faces Faces Pain Scale: Hurts a little bit Pain Location: generalized with movement Pain Descriptors / Indicators: Discomfort;Grimacing Pain Intervention(s): Limited activity within patient's tolerance;Monitored during session  Home Living Family/patient expects to be discharged to:: Skilled nursing facility Living Arrangements: Spouse/significant other;Other relatives Available Help at Discharge: Family;Available 24 hours/day Type of Home: House Home  Access: Ramped entrance     Home Layout: Multi-level Alternate Level Stairs-Number of Steps: 1 Alternate Level Stairs-Rails: None Bathroom Shower/Tub: Walk-in shower (small)   Bathroom Toilet: Handicapped height     Home Equipment: Conservation officer, nature (2 wheels);Rollator (4 wheels);Grab bars - tub/shower;BSC/3in1   Additional Comments: doesnt use BSC/3n1 due to opening too small      Prior Functioning/Environment              Frequency  Min 2X/week        Progress Toward Goals  OT Goals(current goals can now be found in the care plan section)  Progress towards OT goals: Progressing toward goals  Acute Rehab OT Goals Patient Stated Goal: did not state OT Goal Formulation: With patient/family Time For Goal Achievement: 12/27/20 Potential to Achieve Goals: Good ADL Goals Pt Will Perform Grooming: with min assist Pt Will Transfer to Toilet: with min assist;ambulating;bedside commode Additional ADL Goal #1: Pt will be min A to roll left and right to A with basic ADLs and in prep for OOB Additional ADL Goal #2: Pt will be able to come up to sit from right side with min A Additional ADL Goal #3: Pt able to maintain EOB sitting balance with min guard A  Plan Discharge plan needs to be updated;Frequency remains appropriate    Co-evaluation    PT/OT/SLP Co-Evaluation/Treatment: Yes Reason for Co-Treatment: Complexity of the  patient's impairments (multi-system involvement);Necessary to address cognition/behavior during functional activity;To address functional/ADL transfers;For patient/therapist safety PT goals addressed during session: Mobility/safety with mobility;Balance OT goals addressed during session: ADL's and self-care;Proper use of Adaptive equipment and DME;Strengthening/ROM      AM-PAC OT "6 Clicks" Daily Activity     Outcome Measure   Help from another person eating meals?: A Lot Help from another person taking care of personal grooming?: A Lot Help from  another person toileting, which includes using toliet, bedpan, or urinal?: Total Help from another person bathing (including washing, rinsing, drying)?: A Lot Help from another person to put on and taking off regular upper body clothing?: Total Help from another person to put on and taking off regular lower body clothing?: Total 6 Click Score: 9    End of Session Equipment Utilized During Treatment: Oxygen  OT Visit Diagnosis: Unsteadiness on feet (R26.81);Other abnormalities of gait and mobility (R26.89);Muscle weakness (generalized) (M62.81);Hemiplegia and hemiparesis Hemiplegia - Right/Left: Left Hemiplegia - dominant/non-dominant: Non-Dominant Hemiplegia - caused by: Other cerebrovascular disease   Activity Tolerance Patient tolerated treatment well   Patient Left in bed;with call bell/phone within reach;with nursing/sitter in room;with family/visitor present   Nurse Communication Mobility status        Time: 1779-3903 OT Time Calculation (min): 23 min  Charges: OT General Charges $OT Visit: 1 Visit OT Treatments $Therapeutic Activity: 8-22 mins    Ardis Lawley A Mckynlee Luse 12/15/2020, 4:42 PM

## 2020-12-15 NOTE — Progress Notes (Signed)
SLP Cancellation Note  Patient Details Name: [name] MRN: [id number] DOB: 1930-11-10   Cancelled treatment:       Reason Eval/Treat Not Completed: Other (comment). Given ongoing coughing and struggle with PO intake will proceed with MBS today for instrumental assessment of swallowing   Tannis Burstein, Katherene Ponto 12/15/2020, 8:01 AM

## 2020-12-15 NOTE — Evaluation (Signed)
Physical Therapy Evaluation Patient Details Name: [name] MRN: [id number] DOB: 03/25/1930 Today's Date: 12/15/2020  History of Present Illness  87 yo male presenting after a fall at home resulting in pain in tailbone and hematuria. Upon work up pt found to have acute resp failure with hypoxia due to CHF exacerbation and AMS. MRI 12/4 showed small to moderate size right frontotemporal infarct. PMH includes: afib on anticoagulation, embolic CVA, HTN, and venous stasis ulcers.  Clinical Impression   Pt presents with L hemiparesis UE>LE, poor to zero sitting balance, total difficulty performing bed-level tasks, and decreased activity tolerance. Pt to benefit from acute PT to address deficits. Pt requiring total +2 for moving to/from EOB, tolerating EOB sitting x15 minutes with dynamic tasks but requires mod-max posterior truncal support to maintain sitting. PT to progress mobility as tolerated, and will continue to follow acutely.       Recommendations for follow up therapy are one component of a multi-disciplinary discharge planning process, led by the attending physician.  Recommendations may be updated based on patient status, additional functional criteria and insurance authorization.  Follow Up Recommendations Skilled nursing-short term rehab (<3 hours/day)    Assistance Recommended at Discharge Frequent or constant Supervision/Assistance  Functional Status Assessment Patient has had a recent decline in their functional status and/or demonstrates limited ability to make significant improvements in function in a reasonable and predictable amount of time  Equipment Recommendations  Other (comment) (defer)    Recommendations for Other Services       Precautions / Restrictions Precautions Precautions: Fall Precaution Comments: L hemiparesis UE>LE Restrictions Weight Bearing Restrictions: No      Mobility  Bed Mobility Overal bed mobility: Needs Assistance Bed Mobility: Supine  to Sit;Sit to Supine     Supine to sit: Total assist;+2 for physical assistance Sit to supine: Total assist;+2 for physical assistance   General bed mobility comments: total +2 for all aspects, pt initiating LEs translation to EOB but able to do very little    Transfers                   General transfer comment: unable given fatigue with EOB activity    Ambulation/Gait                  Stairs            Wheelchair Mobility    Modified Rankin (Stroke Patients Only)       Balance Overall balance assessment: Needs assistance Sitting-balance support: Single extremity supported;Feet supported Sitting balance-Leahy Scale: Poor Sitting balance - Comments: posterior leaning, max truncal assist to maintain upright. Propping bilat elbows with max truncal and LE assist to maintain Postural control: Posterior lean;Left lateral lean     Standing balance comment: unable to safely attempt                             Pertinent Vitals/Pain Pain Assessment: No/denies pain Pain Intervention(s): Monitored during session    Home Living Family/patient expects to be discharged to:: Skilled nursing facility Living Arrangements: Spouse/significant other;Other relatives Available Help at Discharge: Family;Available 24 hours/day Type of Home: House Home Access: Ramped entrance     Alternate Level Stairs-Number of Steps: 1 Home Layout: Multi-level Home Equipment: [place] (2 wheels);Rollator (4 wheels);Grab bars - tub/shower;BSC/3in1 Additional Comments: doesnt use BSC/3n1 due to opening too small    Prior Function Prior Level of Function : Needs assist  Physical Assist : ADLs (physical)   ADLs (physical): Bathing;Dressing   ADLs Comments: Reports he could bath and dress himself but his son does it for him (it's faster)     Hand Dominance   Dominant Hand: Right    Extremity/Trunk Assessment   Upper Extremity Assessment Upper  Extremity Assessment: Defer to OT evaluation    Lower Extremity Assessment Lower Extremity Assessment: Generalized weakness;RLE deficits/detail RLE Deficits / Details: contraction only knee flexion, knee extension, hip flexion RLE Coordination: decreased fine motor;decreased gross motor    Cervical / Trunk Assessment Cervical / Trunk Assessment: Kyphotic  Communication   Communication: No difficulties  Cognition Arousal/Alertness: Lethargic (moreso drowsy than lethargic) Behavior During Therapy: Flat affect Overall Cognitive Status: Impaired/Different from baseline Area of Impairment: Orientation                 Orientation Level: Disoriented to;Place;Person;Situation             General Comments: Pt states name, incorrectly states year he was born in, states "I am here" when asked where he is, and PT explains to pt he had a CVA.        General Comments General comments (skin integrity, edema, etc.): in afib rhythm    Exercises Other Exercises Other Exercises: EOB x15 minutes, lateral elbow propping bilat, AP leaning, using RUE to push/pull to upright sitting, correcting L lateral leaning with truncal and pelvic inputs   Assessment/Plan    PT Assessment Patient needs continued PT services  PT Problem List Decreased strength;Decreased mobility;Decreased safety awareness;Impaired tone;Decreased knowledge of precautions;Decreased activity tolerance;Decreased balance;Decreased knowledge of use of DME;Decreased cognition;Decreased coordination;Cardiopulmonary status limiting activity       PT Treatment Interventions DME instruction;Therapeutic activities;Gait training;Therapeutic exercise;Patient/family education;Balance training;Functional mobility training;Neuromuscular re-education    PT Goals (Current goals can be found in the Care Plan section)  Acute Rehab PT Goals PT Goal Formulation: With patient/family Time For Goal Achievement: 12/29/20 Potential to  Achieve Goals: Fair    Frequency Min 3X/week   Barriers to discharge        Co-evaluation PT/OT/SLP Co-Evaluation/Treatment: Yes Reason for Co-Treatment: For patient/therapist safety;To address functional/ADL transfers;Complexity of the patient's impairments (multi-system involvement) PT goals addressed during session: Mobility/safety with mobility;Balance         AM-PAC PT "6 Clicks" Mobility  Outcome Measure Help needed turning from your back to your side while in a flat bed without using bedrails?: Total Help needed moving from lying on your back to sitting on the side of a flat bed without using bedrails?: Total Help needed moving to and from a bed to a chair (including a wheelchair)?: Total Help needed standing up from a chair using your arms (e.g., wheelchair or bedside chair)?: Total Help needed to walk in hospital room?: Total Help needed climbing 3-5 steps with a railing? : Total 6 Click Score: 6    End of Session   Activity Tolerance: Patient limited by fatigue Patient left: in bed;with call bell/phone within reach;with bed alarm set;with family/visitor present Nurse Communication: Mobility status PT Visit Diagnosis: Other abnormalities of gait and mobility (R26.89);Hemiplegia and hemiparesis Hemiplegia - Right/Left: Left Hemiplegia - dominant/non-dominant: Non-dominant Hemiplegia - caused by: Cerebral infarction    Time: 1451-1514 PT Time Calculation (min) (ACUTE ONLY): 23 min   Charges:   PT Evaluation $PT Eval Low Complexity: 1 Low        Gurjit Loconte S, PT DPT Acute Rehabilitation Services Pager [phone]  Office [phone]   Roxine Caddy E Ruffin Pyo 12/15/2020,  3:42 PM

## 2020-12-15 NOTE — Progress Notes (Signed)
Was coughing profusely and started to vomit.  Heart rate did go up to the 170s briefly during that time.  This nurse was already in the room and was attempting to get an EKG because they had been calling to report HR was going into the 130s and I did notify Dr Tonie Griffith, who said to obtain an EKG.  He was suctioned after he vomited and is resting comfortably at this time.  He was given Metoprolol 2.5 mg IV because of his heart rate.  Whenever tele has called and I go recheck his heart rate, it is often in the 70s, 80s, and 90s when I take it apically for a full minute.  Lungs sound congested and he is encouraged to cough to help clear his lungs.  Denies pain.

## 2020-12-15 NOTE — Progress Notes (Addendum)
STROKE TEAM PROGRESS NOTE   INTERVAL HISTORY Patient is seen in his room with his son at the bedside.   Patient had a rough night last night and required Lasix and is quite sleepy and tired this morning but can be aroused and follows simple commands.  Continues to have rapid heart rate.  Therapy evaluation yet pending.  Speech therapy has cleared him for dysphagia 1 pure solids and nectar thick liquid diet.  Continues to have significant left hemiplegia.  Vital signs stable except for rapid heart rate  Vitals:   12/15/20 1040 12/15/20 1110 12/15/20 1210 12/15/20 1310  BP: 118/75 122/65 116/67 117/76  Pulse:    (!) 110  Resp: (!) 28 (!) 27 (!) 25 (!) 22  Temp:    99.1 F (37.3 C)  TempSrc:    Oral  SpO2: 94% 94% 94% 93%  Weight:      Height:       CBC:  Recent Labs  Lab 12/24/2020 1400 12/12/20 0053 12/13/20 1250 12/14/20 0340  WBC 7.9   < > 12.3* 9.8  NEUTROABS 6.0  --   --  8.0*  HGB 13.9   < > 13.8 13.4  HCT 43.4   < > 42.9 42.5  MCV 87.7   < > 88.5 89.5  PLT PLATELET CLUMPS NOTED ON SMEAR, UNABLE TO ESTIMATE   < > PLATELET CLUMPS NOTED ON SMEAR, UNABLE TO ESTIMATE PLATELET CLUMPS NOTED ON SMEAR, UNABLE TO ESTIMATE   < > = values in this interval not displayed.   Basic Metabolic Panel:  Recent Labs  Lab 12/12/20 1423 12/13/20 1250 12/14/20 0340 12/15/20 0207  NA 137 132* 134* 129*  K 3.8 3.5 3.4* 4.1  CL 95* 91* 92* 89*  CO2 33* 32 32 27  GLUCOSE 87 138* 93 188*  BUN 12 19 22  24*  CREATININE 1.17 1.19 1.25* 1.22  CALCIUM 9.0 9.0 8.9 8.9  MG 2.0 2.0  --   --    Lipid Panel:  Recent Labs  Lab 12/15/20 0207  CHOL 114  TRIG 65  HDL 46  CHOLHDL 2.5  VLDL 13  LDLCALC 55   HgbA1c:  Recent Labs  Lab 12/15/20 0207  HGBA1C 6.4*   Urine Drug Screen: No results for input(s): LABOPIA, COCAINSCRNUR, LABBENZ, AMPHETMU, THCU, LABBARB in the last 168 hours.  Alcohol Level No results for input(s): ETH in the last 168 hours.  IMAGING   DG Swallowing Func-Speech  Pathology  Result Date: 12/15/2020 Table formatting from the original result was not included. Objective Swallowing Evaluation: Type of Study: MBS-Modified Barium Swallow Study  Patient Details Name: KAELOB PERSKY MRN: [id number] Date of Birth: [birth year] Today's Date: 12/15/2020 Time: SLP Start Time (ACUTE ONLY): 0910 -SLP Stop Time (ACUTE ONLY): 7829 SLP Time Calculation (min) (ACUTE ONLY): 15 min Past Medical History: Past Medical History: Diagnosis Date  Atrial fibrillation ([place]) 10/15/2012  Cancer ([place])   Skin (R hand)  Embolic stroke ([place]) 56/21/3086  Essential hypertension 01/21/2014  Long term current use of anticoagulant therapy 12/25/2012 Past Surgical History: Past Surgical History: Procedure Laterality Date  APPENDECTOMY    CARDIAC CATHETERIZATION    CYSTOSCOPY WITH INSERTION OF UROLIFT N/A 10/10/2017  Procedure: CYSTOSCOPY WITH INSERTION OF UROLIFT;  Surgeon: [name], MD;  Location: [facility];  Service: Urology;  Laterality: N/A;  NEEDS 60 MIN  EYE SURGERY Bilateral 2013  Cataracts HPI: Pt is an 87 y.o. male who presented initially  with ongoing shortness of breath. Dx acute enceophalopathy.  CXR 12/3: Increased opacity within the right upper lung, suspicious for pneumonia, alternatively asymmetric edema or atelectasis. Pt was evaluated by SLP on 9/4 at 9 am, foudn to be toelrating PO with a possible mild baseline dysphagia. Later that morning a code stroke was called; patient was noted to have left-sided weakness.  He was last known normal at around 9:30 AM the same day.   Initial CT head was negative however due to impressive stroke findings on clinical exam, patient received TNK, subsequently MRI showed small to moderate size right frontotemporal infarct.  PMH: CHF, atrial fibrillation, venous stasis, hypertension, history of CVA, thrombocytopenia.  No data recorded  Recommendations for follow up therapy are one component of a multi-disciplinary discharge planning process, led by the attending  physician.  Recommendations may be updated based on patient status, additional functional criteria and insurance authorization. Assessment / Plan / Recommendation Clinical Impressions 12/15/2020 Clinical Impression Pt demonstrates a moderate oropharyngeal dysphagia with slightly delayed/incomplete laryngeal closure prior to swallow with large boluses of nectar and thin liquids. This results in instances of trace silent aspiration of thin and nectar. Pt did have a cough with slightly larger quantity of aspiration. Using a cup to restrict and control bolus size prevented aspiration with thin and nectar. A chin tuck was not beneficial. Pt additionally had prolonged mastication of solids and required cues to complete oral transit of solids in some cases. Esophageal sweep unremarkable. Suspect pt is having trace aspiration events with liquids, eventually accumulating to finally trigger coughing by the end of the meal. Pt is recommneded to consume a highly conservative diet to prevent coughing and vomiting (seen yesterday) until strength and mentation improves as he is slightly sluggish which may be acutely contributing at this time. Recommend dys 1 (puree) and nectar thick liquids NO STRAWS. Will f/u, recommend AIR. SLP Visit Diagnosis Dysphagia, unspecified (R13.10) Attention and concentration deficit following -- Frontal lobe and executive function deficit following -- Impact on safety and function Moderate aspiration risk   Treatment Recommendations 12/15/2020 Treatment Recommendations Therapy as outlined in treatment plan below   Prognosis 12/15/2020 Prognosis for Safe Diet Advancement Good Barriers to Reach Goals -- Barriers/Prognosis Comment -- Diet Recommendations 12/15/2020 SLP Diet Recommendations Dysphagia 1 (Puree) solids;Nectar thick liquid Liquid Administration via Cup;No straw Medication Administration Whole meds with puree Compensations Slow rate;Small sips/bites;Clear throat intermittently Postural Changes --    Other Recommendations 12/15/2020 Recommended Consults -- Oral Care Recommendations -- Other Recommendations -- Follow Up Recommendations Acute inpatient rehab (3hours/day) Assistance recommended at discharge Frequent or constant Supervision/Assistance Functional Status Assessment Patient has had a recent decline in their functional status and demonstrates the ability to make significant improvements in function in a reasonable and predictable amount of time. Frequency and Duration  12/15/2020 Speech Therapy Frequency (ACUTE ONLY) min 2x/week Treatment Duration 2 weeks   Oral Phase 12/15/2020 Oral Phase Impaired Oral - Pudding Teaspoon -- Oral - Pudding Cup -- Oral - Honey Teaspoon -- Oral - Honey Cup -- Oral - Nectar Teaspoon -- Oral - Nectar Cup WFL Oral - Nectar Straw WFL Oral - Thin Teaspoon -- Oral - Thin Cup WFL Oral - Thin Straw WFL Oral - Puree WFL Oral - Mech Soft -- Oral - Regular Decreased bolus cohesion;Delayed oral transit;Piecemeal swallowing;Lingual/palatal residue;Left pocketing in lateral sulci;Right pocketing in lateral sulci;Reduced posterior propulsion Oral - Multi-Consistency Decreased bolus cohesion;Delayed oral transit;Piecemeal swallowing;Lingual/palatal residue;Left pocketing in lateral sulci;Right pocketing in lateral sulci;Reduced posterior propulsion Oral - Pill Decreased bolus cohesion;Delayed oral transit;Piecemeal swallowing;Lingual/palatal  residue;Left pocketing in lateral sulci;Right pocketing in lateral sulci;Reduced posterior propulsion Oral Phase - Comment --  Pharyngeal Phase 12/15/2020 Pharyngeal Phase Impaired Pharyngeal- Pudding Teaspoon -- Pharyngeal -- Pharyngeal- Pudding Cup -- Pharyngeal -- Pharyngeal- Honey Teaspoon -- Pharyngeal -- Pharyngeal- Honey Cup -- Pharyngeal -- Pharyngeal- Nectar Teaspoon -- Pharyngeal -- Pharyngeal- Nectar Cup WFL Pharyngeal -- Pharyngeal- Nectar Straw Penetration/Aspiration before swallow Pharyngeal Material enters airway, passes BELOW cords  without attempt by patient to eject out (silent aspiration) Pharyngeal- Thin Teaspoon -- Pharyngeal -- Pharyngeal- Thin Cup Penetration/Aspiration before swallow Pharyngeal Material enters airway, remains ABOVE vocal cords then ejected out Pharyngeal- Thin Straw Penetration/Aspiration before swallow Pharyngeal Material enters airway, passes BELOW cords without attempt by patient to eject out (silent aspiration) Pharyngeal- Puree WFL Pharyngeal -- Pharyngeal- Mechanical Soft -- Pharyngeal -- Pharyngeal- Regular WFL Pharyngeal -- Pharyngeal- Multi-consistency WFL Pharyngeal -- Pharyngeal- Pill WFL Pharyngeal -- Pharyngeal Comment --  No flowsheet data found. DeBlois, Katherene Ponto 12/15/2020, 11:13 AM                      PHYSICAL EXAM General: Well-developed, well-nourished elderly Caucasian male patient in no acute distress   NEURO:  Mental Status: AA&Ox2 Speech/Language: Difficult to assess due to patient being hard of hearing.  Some dysarthria present and repetition is impaired  Cranial Nerves:  II: PERRL. Visual fields full.  III, IV, VI: EOMI. Eyelids elevate symmetrically.  V: Sensation is intact to light touch and symmetrical to face.  VII: Left facial droop present   VIII: hearing intact to voice. IX, X: Phonation is normal.  XII: tongue is midline without fasciculations. Motor: 5/5 strength to RUE, 3/5 to RLE but effort is poor., 2/5 to LUE and 4/5 to LLE.  Significant weakness of left grip. Sensation- Intact to light touch bilaterally. Extinction absent to light touch to DSS.   Coordination: FTN unable to perform on left  Gait- deferred    ASSESSMENT/PLAN Mr. LATHEN SEAL is a 87 y.o. male with history of atrial fibrillation on warfarin, HTN, thrombocytopenia and HFpEF who was currently admitted for CHF exacerbation presenting as a code stroke with left hemiplegia, left sided facial droop and dysarthria.  His last known well time was 0930 on 12/4.  Code stroke was called.   Although patient was taking warfarin, his INR was only 1.6.  His platelet count was unable to be obtained due to clumping.  Risks and benefits were discussed with patient's family and TNK was administered.  Patient was admitted to the ICU post TNK.  Will start Eliquis now that patient is 24 hours out from TNK.  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 ASPECTS 10.    CTA head & neck no LVO, atherosclerosis in head and neck with moderate right vertebral artery origin stenosis CT perfusion no core infarct or penumbra seen MRI  right frontoparietal infarct.  Study limited due to patient tolerance 2D Echo EF 60-65%, no atrial level shunt LDL 75 HgbA1c pending VTE prophylaxis - SCDs    Diet   DIET - DYS 1 Room service appropriate? Yes with Assist; Fluid consistency: Nectar Thick   warfarin daily prior to admission, now on Eliquis (apixaban) daily.  Therapy recommendations:  pending Disposition:  pending  Hypertension Home meds:  none Stable Keep SBP <160 Long-term BP goal normotensive  Hyperlipidemia Home meds:  none LDL pending, goal < 70 Add statin if needed  Continue statin at discharge if indicated  Risk for Diabetes type II  Home meds:  none HgbA1c No results found for requested labs within last 26280 hours., goal < 7.0 CBGs Recent Labs    12/13/20 1044 12/14/20 0728  GLUCAP 166* 80    SSI  Atrial fibrillation Patient had been taking warfarin long term, but INR was subtherapeutic Will change anticoagulation to Eliquis  CHF Patient is currently admitted for CHF exacerbation Management per primary team  Other Stroke Risk Factors Advanced Age >/= 57  Former cigarette smoker Hx stroke Congestive heart failure  Other Active Problems Thrombocytopenia- platelet count unable to be measured  due to clumping.  Will check daily CBC  Hospital day # 4  Continue management for rapid A. fib heart rate as per primary team.  Mobilize out of bed.  Ongoing therapies.  Transfer to rehab when bed available.  Continue Eliquis for stroke stroke prevention for his A. fib as he failed warfarin.  Stroke team will sign off.  Kindly call for questions.  Discussed with patient and son at bedside with Dr. Einar Grad.  Greater than 50% time during this 25-minute visit was spent in counseling and coordination of care acute stroke and atrial fibrillation need for long-term anticoagulation some questions.  Follow-up as an outpatient stroke clinic in 2 months        Antony Contras, [place] Pager: [phone] 12/15/2020 1:29 PM    To contact Stroke Continuity provider, please refer to [email or web address] After hours, contact General Neurology

## 2020-12-15 NOTE — Care Management Important Message (Signed)
Important Message  Patient Details  Name: [name] MRN: [id number] Date of Birth: 11/28/1930   Medicare Important Message Given:  Yes     Orbie Pyo 12/15/2020, 2:52 PM

## 2020-12-15 NOTE — Progress Notes (Signed)
Modified Barium Swallow Progress Note  Patient Details  Name: ONIEL MELESKI MRN: [id number] Date of Birth: 02-14-1930  Today's Date: 12/15/2020  Modified Barium Swallow completed.  Full report located under Chart Review in the Imaging Section.  Brief recommendations include the following:  Clinical Impression  Pt demonstrates a moderate oropharyngeal dysphagia with slightly delayed/incomplete laryngeal closure prior to swallow with large boluses of nectar and thin liquids. This results in instances of trace silent aspiration of thin and nectar. Pt did have a cough with slightly larger quantity of aspiration. Using a cup to restrict and control bolus size prevented aspiration with thin and nectar. A chin tuck was not beneficial. Pt additionally had prolonged mastication of solids and required cues to complete oral transit of solids in some cases. Esophageal sweep unremarkable. Suspect pt is having trace aspiration events with liquids, eventually accumulating to finally trigger coughing by the end of the meal. Pt is recommneded to consume a highly conservative diet to prevent coughing and vomiting (seen yesterday) until strength and mentation improves as he is slightly sluggish which may be acutely contributing at this time. Recommend dys 1 (puree) and nectar thick liquids NO STRAWS. Will f/u, recommend AIR.   Swallow Evaluation Recommendations       SLP Diet Recommendations: Dysphagia 1 (Puree) solids;Nectar thick liquid   Liquid Administration via: Cup;No straw   Medication Administration: Whole meds with puree   Supervision: Full assist for feeding   Compensations: Slow rate;Small sips/bites;Clear throat intermittently         Herbie [place], MA [place] Office [phone]        Lynann Beaver 12/15/2020,9:54 AM

## 2020-12-15 NOTE — Progress Notes (Signed)
Have had to call MD several times throughout the night because HR is bouncing up and down and ranging from 60s-150s.  Is on tele and EKG confirmed afib with RVR.  Was given an extra dose of Metoprolol per order then was given a dose of Cardizem 10 mg 1 time.  HR now 111 at this time and BP is 90/55 with a MAP of 68.  His MEWS is now red and Dr Tonie Griffith was notified by [facility] message  and son Louie Casa was notified as well.  Pt is resting at this time and said occasionally will feel like his heart is racing but is not at this time.  No further nausea or vomiting.  Denies chest pain or pressure.

## 2020-12-15 NOTE — Progress Notes (Signed)
Inpatient Rehabilitation Admissions Coordinator   PT recommending SNF at this time. I will not pursue rehab consult and CIR admit .  Danne Baxter, RN, MSN Rehab Admissions Coordinator [phone] 12/15/2020 3:58 PM

## 2020-12-15 NOTE — Progress Notes (Signed)
PROGRESS NOTE    [name]  OFB:[id number] DOB: [birth year] DOA: 12/24/2020 PCP: Danny Arabian, MD   Brief Narrative:  87 years old male with past medical history of CHF, persistent atrial fibrillation, hypertension, history of CVA, thrombocytopenia, came with a chief complaint of shortness of breath.  The symptoms started 1 week ago. His saturation dropped at home in the 70s. In the emergency room he was found to be in congestive heart failure, with a BNP elevated 231 troponin elevated at 41 trended down to 38 on repeat Respiratory panel negative chest x-ray showed edema Patient received Lasix IV 80 mg, DuoNebs, metoprolol, INR subtherapeutic 1.6. Patient used to take Lasix 40 mg daily BUN and creatinine were normal  Assessment & Plan:   Active Problems:   Atrial fibrillation (HCC)   Hx of Embolic stroke (HCC)   Essential hypertension   Chronic venous stasis dermatitis   Hypokalemia   Acute on chronic diastolic CHF (congestive heart failure) (HCC)  Acute hypoxic respiratory failure secondary to acute on chronic congestive heart failure with preserved ejection fraction: Still requiring as needed BiPAP and currently 6 L of oxygen.  Feels better.  He still has crackles at the bases bilaterally.  Has reasonable urine output in last 24 hours.  He is net 8.4 L since admission.  Repeat chest x-ray 12/14/2020 does not show much improvement, will continue IV Lasix 40 mg twice daily with strict I's and O's, low-sodium diet and daily weight.  Bilateral upper lungs obesity: COVID, influenza negative.  Procalcitonin unremarkable.  Lactic acid normal.  No leukocytosis.  He is afebrile.  I personally doubt bacterial pneumonia and thus I will discontinue antibiotics.  Hypokalemia: Resolved.  Permanent/persistent atrial fibrillation: Had issues with rate control overnight requiring multiple rate control IV medications.  During my evaluation, his rates were mostly around 110 but with intermittent  jump up to 130.  Patient lethargic and asymptomatic.  Continue p.o. Toprol-XL as well as as needed IV Lopressor.  He has been resumed back on Eliquis afternoon of 12/14/2020.  Monitor closely.  Blood pressure was also on the lower side earlier, however improving now.  If needed and if blood pressure allows, will add more rate control medications.  Will consider cardiology consult if no improvement.  Acute encephalopathy: Patient is lethargic, unable to assess orientation.  His lethargy is multifactorial but mainly secondary to stroke.  Acute ischemic stroke: On 12/13/2020, patient was noted to have left-sided weakness.  He was last known normal at around 9:30 AM the same day.  Code stroke was called, neurology was consulted, patient underwent stroke work-up.  Initial CT head was negative however due to impressive stroke findings on clinical exam, patient received TNK, subsequently MRI showed small to moderate size right frontotemporal infarct.  Repeat head CT today does not show any difference.  Patient is still with significant left upper extremity weakness and some weakness in the left lower extremity.  On Eliquis now.  On statin.  Management deferred to neurology.  Moderate oropharyngeal dysphagia: Had issues with swallowing at multiple times yesterday.  Now s/p MBS 12/15/2020, diet downgraded to dysphagia 1 diet.  Hyperlipidemia: Continue atorvastatin.  GOC: Long discussion with patient's son [name], he agrees to change to DNR.  He also sounded open to comfort care down the road after watching for few days.  We will consult palliative care.  DVT prophylaxis: Eliquis   Code Status: DNR  Family Communication: Discussed with son at the bedside  Status is:  Inpatient  Remains inpatient appropriate because: Needs further inpatient management of a stroke and CHF.  Estimated body mass index is 25.33 kg/m as calculated from the following:   Height as of this encounter: 6\' 1"  (1.854 m).    Weight as of this encounter: 87.1 kg.  Nutritional Assessment: Body mass index is 25.33 kg/m.Marland Kitchen Seen by dietician.  I agree with the assessment and plan as outlined below: Nutrition Status:   Skin Assessment: I have examined the patient's skin and I agree with the wound assessment as performed by the wound care RN as outlined below:    Consultants:  Neurology  Procedures:  None  Antimicrobials:  Anti-infectives (From admission, onward)    Start     Dose/Rate Route Frequency Ordered Stop   12/13/20 2215  ceFEPIme (MAXIPIME) 2 g in sodium chloride 0.9 % 100 mL IVPB  Status:  Discontinued        2 g 200 mL/hr over 30 Minutes Intravenous Every 12 hours 12/13/20 2121 12/14/20 1155   12/13/20 1930  doxycycline (VIBRAMYCIN) 100 mg in sodium chloride 0.9 % 250 mL IVPB        100 mg 125 mL/hr over 120 Minutes Intravenous Every 12 hours 12/13/20 1835     12/13/20 1230  ceFEPIme (MAXIPIME) 1 g in sodium chloride 0.9 % 100 mL IVPB  Status:  Discontinued        1 g 200 mL/hr over 30 Minutes Intravenous Every 12 hours 12/13/20 1131 12/13/20 2121          Subjective: Patient seen and examined, much more lethargic compared to yesterday but easily arousable.  When awake, he seemed oriented.  No problem with his speech.  Still with left upper arm weakness.  Objective: Vitals:   12/15/20 0940 12/15/20 1010 12/15/20 1040 12/15/20 1110  BP: 108/64 115/73 118/75 122/65  Pulse:      Resp: (!) 25 (!) 23 (!) 28 (!) 27  Temp:      TempSrc:      SpO2: 98% 92% 94% 94%  Weight:      Height:        Intake/Output Summary (Last 24 hours) at 12/15/2020 1202 Last data filed at 12/15/2020 0700 Gross per 24 hour  Intake 256.66 ml  Output 2350 ml  Net -2093.34 ml    Filed Weights   12/12/20 0545 12/13/20 0344 12/14/20 0900  Weight: 92.5 kg 90.2 kg 87.1 kg    Examination:  General exam: Appears lethargic. Respiratory system: Diminished breath sounds with poor inspiratory effort, no  crackles or wheezes. Cardiovascular system: S1 & S2 heard, irregularly irregular rate and rhythm. No JVD, murmurs, rubs, gallops or clicks. No pedal edema. Gastrointestinal system: Abdomen is nondistended, soft and nontender. No organomegaly or masses felt. Normal bowel sounds heard. Central nervous system: Lethargic, partially oriented.  1/5 power in left upper extremity and 4/5 power in left lower extremity.  Normal power in right side of the body. Skin: No rashes, lesions or ulcers.     Data Reviewed: I have personally reviewed following labs and imaging studies  CBC: Recent Labs  Lab 12/27/2020 1400 12/12/20 0053 12/13/20 0200 12/13/20 1049 12/13/20 1250 12/14/20 0340  WBC 7.9 6.9 11.1*  --  12.3* 9.8  NEUTROABS 6.0  --   --   --   --  8.0*  HGB 13.9 13.6 13.7  --  13.8 13.4  HCT 43.4 42.3 43.5  --  42.9 42.5  MCV 87.7 88.7 89.5  --  88.5 89.5  PLT PLATELET CLUMPS NOTED ON SMEAR, UNABLE TO ESTIMATE PLATELET CLUMPS NOTED ON SMEAR, UNABLE TO ESTIMATE PLATELET CLUMPS NOTED ON SMEAR, UNABLE TO ESTIMATE PLATELET CLUMPS NOTED ON SMEAR, UNABLE TO ESTIMATE PLATELET CLUMPS NOTED ON SMEAR, UNABLE TO ESTIMATE PLATELET CLUMPS NOTED ON SMEAR, UNABLE TO ESTIMATE    Basic Metabolic Panel: Recent Labs  Lab 01/02/2021 1550 12/26/2020 2123 12/12/20 0053 12/12/20 1423 12/13/20 1250 12/14/20 0340 12/15/20 0207  NA  --    < > 136 137 132* 134* 129*  K  --    < > 3.5 3.8 3.5 3.4* 4.1  CL  --    < > 93* 95* 91* 92* 89*  CO2  --    < > 36* 33* 32 32 27  GLUCOSE  --    < > 113* 87 138* 93 188*  BUN  --    < > 12 12 19 22  24*  CREATININE  --    < > 1.03 1.17 1.19 1.25* 1.22  CALCIUM  --    < > 8.9 9.0 9.0 8.9 8.9  MG 1.9  --   --  2.0 2.0  --   --    < > = values in this interval not displayed.    GFR: Estimated Creatinine Clearance: 47.3 mL/min (by C-G formula based on SCr of 1.22 mg/dL). Liver Function Tests: Recent Labs  Lab 01/02/2021 1400 12/12/20 0053 12/13/20 1250  AST 23 27 31    ALT 16 19 17   ALKPHOS 55 58 58  BILITOT 0.8 0.9 1.8*  PROT 8.6* 7.9 7.8  ALBUMIN 4.0 3.2* 3.1*    No results for input(s): LIPASE, AMYLASE in the last 168 hours. No results for input(s): AMMONIA in the last 168 hours. Coagulation Profile: Recent Labs  Lab 01/01/2021 1400 12/12/20 0053 12/13/20 0200 12/14/20 0340 12/15/20 0207  INR 1.6* 1.6* 1.6* 1.9* 2.0*    Cardiac Enzymes: No results for input(s): CKTOTAL, CKMB, CKMBINDEX, TROPONINI in the last 168 hours. BNP (last 3 results) No results for input(s): PROBNP in the last 8760 hours. HbA1C: Recent Labs    12/15/20 0207  HGBA1C 6.4*   CBG: Recent Labs  Lab 12/13/20 1044 12/14/20 0728  GLUCAP 166* 80    Lipid Profile: Recent Labs    12/15/20 0207  CHOL 114  HDL 46  LDLCALC 55  TRIG 65  CHOLHDL 2.5   Thyroid Function Tests: No results for input(s): TSH, T4TOTAL, FREET4, T3FREE, THYROIDAB in the last 72 hours. Anemia Panel: No results for input(s): VITAMINB12, FOLATE, FERRITIN, TIBC, IRON, RETICCTPCT in the last 72 hours. Sepsis Labs: Recent Labs  Lab 12/20/2020 1400 12/28/2020 1550 12/13/20 1250 12/13/20 1506  PROCALCITON  --   --  0.11  --   LATICACIDVEN 1.3 1.4 1.6 1.3     Recent Results (from the past 240 hour(s))  Resp Panel by RT-PCR (Flu A&B, Covid) Nasopharyngeal Swab     Status: None   Collection Time: 12/23/2020  2:00 PM   Specimen: Nasopharyngeal Swab; Nasopharyngeal(NP) swabs in vial transport medium  Result Value Ref Range Status   SARS Coronavirus 2 by RT PCR NEGATIVE NEGATIVE Final    Comment: (NOTE) SARS-CoV-2 target nucleic acids are NOT DETECTED.  The SARS-CoV-2 RNA is generally detectable in upper respiratory specimens during the acute phase of infection. The lowest concentration of SARS-CoV-2 viral copies this assay can detect is 138 copies/mL. A negative result does not preclude SARS-Cov-2 infection and should not be used as the sole  basis for treatment or other patient  management decisions. A negative result may occur with  improper specimen collection/handling, submission of specimen other than nasopharyngeal swab, presence of viral mutation(s) within the areas targeted by this assay, and inadequate number of viral copies(<138 copies/mL). A negative result must be combined with clinical observations, patient history, and epidemiological information. The expected result is Negative.  Fact Sheet for Patients:  EntrepreneurPulse.com.au  Fact Sheet for Healthcare Providers:  IncredibleEmployment.be  This test is no t yet approved or cleared by the [place] FDA and  has been authorized for detection and/or diagnosis of SARS-CoV-2 by FDA under an Emergency Use Authorization (EUA). This EUA will remain  in effect (meaning this test can be used) for the duration of the COVID-19 declaration under Section 564(b)(1) of the Act, 21 U.S.C.section 360bbb-3(b)(1), unless the authorization is terminated  or revoked sooner.       Influenza A by PCR NEGATIVE NEGATIVE Final   Influenza B by PCR NEGATIVE NEGATIVE Final    Comment: (NOTE) The Xpert Xpress SARS-CoV-2/FLU/RSV plus assay is intended as an aid in the diagnosis of influenza from Nasopharyngeal swab specimens and should not be used as a sole basis for treatment. Nasal washings and aspirates are unacceptable for Xpert Xpress SARS-CoV-2/FLU/RSV testing.  Fact Sheet for Patients: EntrepreneurPulse.com.au  Fact Sheet for Healthcare Providers: IncredibleEmployment.be  This test is not yet approved or cleared by the [place] FDA and has been authorized for detection and/or diagnosis of SARS-CoV-2 by FDA under an Emergency Use Authorization (EUA). This EUA will remain in effect (meaning this test can be used) for the duration of the COVID-19 declaration under Section 564(b)(1) of the Act, 21 U.S.C. section 360bbb-3(b)(1),  unless the authorization is terminated or revoked.  Performed at [facility], [address], [place], [place] [address]   MRSA Next Gen by PCR, Nasal     Status: None   Collection Time: 12/13/20  3:01 PM   Specimen: Nasal Mucosa; Nasal Swab  Result Value Ref Range Status   MRSA by PCR Next Gen NOT DETECTED NOT DETECTED Final    Comment: (NOTE) The GeneXpert MRSA Assay (FDA approved for NASAL specimens only), is one component of a comprehensive MRSA colonization surveillance program. It is not intended to diagnose MRSA infection nor to guide or monitor treatment for MRSA infections. Test performance is not FDA approved in patients less than 44 years old. Performed at [facility], [place] [address] [place], [place] [address]        Radiology Studies: CT HEAD WO CONTRAST (5MM)  Result Date: 12/14/2020 CLINICAL DATA:  Stroke follow-up EXAM: CT HEAD WITHOUT CONTRAST TECHNIQUE: Contiguous axial images were obtained from the base of the skull through the vertex without intravenous contrast. COMPARISON:  Head CT 12/13/2020 Brain MRI 12/13/2020 FINDINGS: Brain: There is no mass, hemorrhage or extra-axial collection. There is generalized atrophy without lobar predilection. Hypodensity of the white matter is most commonly associated with chronic microvascular disease. Old left PCA territory infarct. Vascular: No abnormal hyperdensity of the major intracranial arteries or dural venous sinuses. No intracranial atherosclerosis. Skull: The visualized skull base, calvarium and extracranial soft tissues are normal. Sinuses/Orbits: No fluid levels or advanced mucosal thickening of the visualized paranasal sinuses. No mastoid or middle ear effusion. The orbits are normal. IMPRESSION: 1. No acute intracranial abnormality. 2. Old left PCA territory infarct and findings of chronic microvascular ischemia. Electronically Signed   By: Ulyses Jarred M.D.   On: 12/14/2020  01:33   MR  BRAIN WO CONTRAST  Result Date: 12/13/2020 CLINICAL DATA:  Transient ischemic attack (TIA) EXAM: MRI HEAD WITHOUT CONTRAST TECHNIQUE: Multiplanar, multiecho pulse sequences of the brain and surrounding structures were obtained without intravenous contrast. COMPARISON:  Prior CT imaging FINDINGS: Motion degraded DWI and axial T2 FLAIR sequences were obtained. Patient could not tolerate remainder of the study. Area of reduced diffusion in the right frontoparietal lobes. Prominent ventricles and sulci reflecting parenchymal volume loss. Relative prominence of the ventricles as before. Chronic left occipital lobe infarct. Chronic right caudate infarct. IMPRESSION: Patient could not tolerate study beyond acquired sequences and motion artifact is present. Acute small to moderate size right frontoparietal infarct. Electronically Signed   By: Macy Mis M.D.   On: 12/13/2020 14:54   DG CHEST PORT 1 VIEW  Result Date: 12/14/2020 CLINICAL DATA:  Heart failure. EXAM: PORTABLE CHEST 1 VIEW COMPARISON:  December 13, 2020. FINDINGS: The heart size and mediastinal contours are within normal limits. Mild bibasilar opacities are noted concerning for edema or possibly atelectasis. The visualized skeletal structures are unremarkable. IMPRESSION: Mild bibasilar edema or atelectasis. Electronically Signed   By: Marijo Conception M.D.   On: 12/14/2020 14:46   DG Swallowing Func-Speech Pathology  Result Date: 12/15/2020 Table formatting from the original result was not included. Objective Swallowing Evaluation: Type of Study: MBS-Modified Barium Swallow Study  Patient Details Name: DIONDRE PULIS MRN: [id number] Date of Birth: May 02, 1930 Today's Date: 12/15/2020 Time: SLP Start Time (ACUTE ONLY): 0910 -SLP Stop Time (ACUTE ONLY): 4196 SLP Time Calculation (min) (ACUTE ONLY): 15 min Past Medical History: Past Medical History: Diagnosis Date  Atrial fibrillation ([place]) 10/15/2012  Cancer ([place])   Skin (R hand)  Embolic stroke ([place])  22/29/7989  Essential hypertension 01/21/2014  Long term current use of anticoagulant therapy 12/25/2012 Past Surgical History: Past Surgical History: Procedure Laterality Date  APPENDECTOMY    CARDIAC CATHETERIZATION    CYSTOSCOPY WITH INSERTION OF UROLIFT N/A 10/10/2017  Procedure: CYSTOSCOPY WITH INSERTION OF UROLIFT;  Surgeon: [name], MD;  Location: [facility];  Service: Urology;  Laterality: N/A;  NEEDS 60 MIN  EYE SURGERY Bilateral 2013  Cataracts HPI: Pt is an 87 y.o. male who presented initially  with ongoing shortness of breath. Dx acute enceophalopathy. CXR 12/3: Increased opacity within the right upper lung, suspicious for pneumonia, alternatively asymmetric edema or atelectasis. Pt was evaluated by SLP on 9/4 at 9 am, foudn to be toelrating PO with a possible mild baseline dysphagia. Later that morning a code stroke was called; patient was noted to have left-sided weakness.  He was last known normal at around 9:30 AM the same day.   Initial CT head was negative however due to impressive stroke findings on clinical exam, patient received TNK, subsequently MRI showed small to moderate size right frontotemporal infarct.  PMH: CHF, atrial fibrillation, venous stasis, hypertension, history of CVA, thrombocytopenia.  No data recorded  Recommendations for follow up therapy are one component of a multi-disciplinary discharge planning process, led by the attending physician.  Recommendations may be updated based on patient status, additional functional criteria and insurance authorization. Assessment / Plan / Recommendation Clinical Impressions 12/15/2020 Clinical Impression Pt demonstrates a moderate oropharyngeal dysphagia with slightly delayed/incomplete laryngeal closure prior to swallow with large boluses of nectar and thin liquids. This results in instances of trace silent aspiration of thin and nectar. Pt did have a cough with slightly larger quantity of aspiration. Using a cup to restrict and control  bolus size prevented aspiration with thin and nectar. A chin tuck was not beneficial. Pt additionally had prolonged mastication of solids and required cues to complete oral transit of solids in some cases. Esophageal sweep unremarkable. Suspect pt is having trace aspiration events with liquids, eventually accumulating to finally trigger coughing by the end of the meal. Pt is recommneded to consume a highly conservative diet to prevent coughing and vomiting (seen yesterday) until strength and mentation improves as he is slightly sluggish which may be acutely contributing at this time. Recommend dys 1 (puree) and nectar thick liquids NO STRAWS. Will f/u, recommend AIR. SLP Visit Diagnosis Dysphagia, unspecified (R13.10) Attention and concentration deficit following -- Frontal lobe and executive function deficit following -- Impact on safety and function Moderate aspiration risk   Treatment Recommendations 12/15/2020 Treatment Recommendations Therapy as outlined in treatment plan below   Prognosis 12/15/2020 Prognosis for Safe Diet Advancement Good Barriers to Reach Goals -- Barriers/Prognosis Comment -- Diet Recommendations 12/15/2020 SLP Diet Recommendations Dysphagia 1 (Puree) solids;Nectar thick liquid Liquid Administration via Cup;No straw Medication Administration Whole meds with puree Compensations Slow rate;Small sips/bites;Clear throat intermittently Postural Changes --   Other Recommendations 12/15/2020 Recommended Consults -- Oral Care Recommendations -- Other Recommendations -- Follow Up Recommendations Acute inpatient rehab (3hours/day) Assistance recommended at discharge Frequent or constant Supervision/Assistance Functional Status Assessment Patient has had a recent decline in their functional status and demonstrates the ability to make significant improvements in function in a reasonable and predictable amount of time. Frequency and Duration  12/15/2020 Speech Therapy Frequency (ACUTE ONLY) min 2x/week  Treatment Duration 2 weeks   Oral Phase 12/15/2020 Oral Phase Impaired Oral - Pudding Teaspoon -- Oral - Pudding Cup -- Oral - Honey Teaspoon -- Oral - Honey Cup -- Oral - Nectar Teaspoon -- Oral - Nectar Cup WFL Oral - Nectar Straw WFL Oral - Thin Teaspoon -- Oral - Thin Cup WFL Oral - Thin Straw WFL Oral - Puree WFL Oral - Mech Soft -- Oral - Regular Decreased bolus cohesion;Delayed oral transit;Piecemeal swallowing;Lingual/palatal residue;Left pocketing in lateral sulci;Right pocketing in lateral sulci;Reduced posterior propulsion Oral - Multi-Consistency Decreased bolus cohesion;Delayed oral transit;Piecemeal swallowing;Lingual/palatal residue;Left pocketing in lateral sulci;Right pocketing in lateral sulci;Reduced posterior propulsion Oral - Pill Decreased bolus cohesion;Delayed oral transit;Piecemeal swallowing;Lingual/palatal residue;Left pocketing in lateral sulci;Right pocketing in lateral sulci;Reduced posterior propulsion Oral Phase - Comment --  Pharyngeal Phase 12/15/2020 Pharyngeal Phase Impaired Pharyngeal- Pudding Teaspoon -- Pharyngeal -- Pharyngeal- Pudding Cup -- Pharyngeal -- Pharyngeal- Honey Teaspoon -- Pharyngeal -- Pharyngeal- Honey Cup -- Pharyngeal -- Pharyngeal- Nectar Teaspoon -- Pharyngeal -- Pharyngeal- Nectar Cup WFL Pharyngeal -- Pharyngeal- Nectar Straw Penetration/Aspiration before swallow Pharyngeal Material enters airway, passes BELOW cords without attempt by patient to eject out (silent aspiration) Pharyngeal- Thin Teaspoon -- Pharyngeal -- Pharyngeal- Thin Cup Penetration/Aspiration before swallow Pharyngeal Material enters airway, remains ABOVE vocal cords then ejected out Pharyngeal- Thin Straw Penetration/Aspiration before swallow Pharyngeal Material enters airway, passes BELOW cords without attempt by patient to eject out (silent aspiration) Pharyngeal- Puree WFL Pharyngeal -- Pharyngeal- Mechanical Soft -- Pharyngeal -- Pharyngeal- Regular WFL Pharyngeal -- Pharyngeal-  Multi-consistency WFL Pharyngeal -- Pharyngeal- Pill WFL Pharyngeal -- Pharyngeal Comment --  No flowsheet data found. [place], Katherene Ponto 12/15/2020, 11:13 AM                     ECHOCARDIOGRAM COMPLETE  Result Date: 12/13/2020    ECHOCARDIOGRAM REPORT   Patient Name:   CLEVESTER HELZER Date of  Exam: 12/13/2020 Medical Rec #:  [id number]     Height:       73.0 in Accession #:    [id number]    Weight:       198.9 lb Date of Birth:  1930-03-01     BSA:          2.146 m Patient Age:    87 years      BP:           132/75 mmHg Patient Gender: M             HR:           101 bpm. Exam Location:  Inpatient Procedure: 2D Echo, Cardiac Doppler and Color Doppler Indications:    Stroke  History:        Patient has prior history of Echocardiogram examinations, most                 recent 10/15/2020. CHF; Arrythmias:Tachycardia and Atrial                 Fibrillation.  Sonographer:    Merrie Roof RDCS Referring Phys: [id number] [place]  1. Left ventricular ejection fraction, by estimation, is 60 to 65%. The left ventricle has normal function. The left ventricle has no regional wall motion abnormalities. There is mild left ventricular hypertrophy of the basal segment. Left ventricular diastolic parameters are indeterminate.  2. Unable to assess RVSP, IVC not well visualized. Right ventricular systolic function is normal. The right ventricular size is normal.  3. Left atrial size was severely dilated.  4. Right atrial size was moderately dilated.  5. The mitral valve is grossly normal. Mild to moderate mitral valve regurgitation.  6. The aortic valve is calcified. Aortic valve regurgitation is trivial. Aortic valve sclerosis/calcification is present, without any evidence of aortic stenosis.  7. The inferior vena cava not well visualized. Comparison(s): No significant change from prior study. FINDINGS  Left Ventricle: Left ventricular ejection fraction, by estimation, is 60 to 65%. The left ventricle has  normal function. The left ventricle has no regional wall motion abnormalities. The left ventricular internal cavity size was normal in size. There is  mild left ventricular hypertrophy of the basal segment. Left ventricular diastolic parameters are indeterminate. Right Ventricle: Unable to assess RVSP, IVC not well visualized. The right ventricular size is normal. No increase in right ventricular wall thickness. Right ventricular systolic function is normal. Left Atrium: Left atrial size was severely dilated. Right Atrium: Right atrial size was moderately dilated. Pericardium: There is no evidence of pericardial effusion. Mitral Valve: The mitral valve is grossly normal. There is moderate calcification of the mitral valve leaflet(s). Mild to moderate mitral valve regurgitation. Tricuspid Valve: The tricuspid valve is normal in structure. Tricuspid valve regurgitation is mild. Aortic Valve: The aortic valve is calcified. Aortic valve regurgitation is trivial. Aortic valve sclerosis/calcification is present, without any evidence of aortic stenosis. Aortic valve mean gradient measures 3.0 mmHg. Aortic valve peak gradient measures 5.8 mmHg. Aortic valve area, by VTI measures 2.02 cm. Pulmonic Valve: The pulmonic valve was not well visualized. Pulmonic valve regurgitation is not visualized. Aorta: The aortic root is normal in size and structure. Venous: The inferior vena cava not well visualized. IAS/Shunts: No atrial level shunt detected by color flow Doppler.  LEFT VENTRICLE PLAX 2D LVIDd:         4.40 cm LVIDs:         2.80 cm LV PW:  1.00 cm LV IVS:        1.40 cm LVOT diam:     2.10 cm LV SV:         39 LV SV Index:   18 LVOT Area:     3.46 cm  RIGHT VENTRICLE RV Basal diam:  3.50 cm RV S prime:     11.50 cm/s TAPSE (M-mode): 1.5 cm LEFT ATRIUM           Index        RIGHT ATRIUM           Index LA diam:      5.10 cm 2.38 cm/m   RA Area:     14.10 cm LA Vol (A2C): 53.2 ml 24.79 ml/m  RA Volume:   29.00  ml  13.51 ml/m LA Vol (A4C): 88.9 ml 41.42 ml/m  AORTIC VALVE AV Area (Vmax):    2.44 cm AV Area (Vmean):   2.50 cm AV Area (VTI):     2.02 cm AV Vmax:           120.00 cm/s AV Vmean:          82.700 cm/s AV VTI:            0.194 m AV Peak Grad:      5.8 mmHg AV Mean Grad:      3.0 mmHg LVOT Vmax:         84.40 cm/s LVOT Vmean:        59.700 cm/s LVOT VTI:          0.113 m LVOT/AV VTI ratio: 0.58  AORTA Ao Root diam: 3.60 cm TRICUSPID VALVE TR Peak grad:   24.8 mmHg TR Vmax:        249.00 cm/s  SHUNTS Systemic VTI:  0.11 m Systemic Diam: 2.10 cm Phineas Inches Electronically signed by Phineas Inches Signature Date/Time: 12/13/2020/1:34:27 PM    Final     Scheduled Meds:  apixaban  5 mg Oral BID   atorvastatin  20 mg Oral Daily   Chlorhexidine Gluconate Cloth  6 each Topical Daily   furosemide  60 mg Intravenous BID   metoprolol succinate  100 mg Oral Daily   sodium chloride flush  3 mL Intravenous Q12H   Continuous Infusions:  sodium chloride Stopped (12/14/20 1151)   doxycycline (VIBRAMYCIN) IV 100 mg (12/15/20 1016)     LOS: 4 days   Time spent: 30 minutes   Darliss Cheney, MD [facility]  12/15/2020, 12:02 PM  Please page via [place] [place] and do not message via secure chat for anything urgent. Secure chat can be used for anything non urgent.  How to contact the [facility] Attending or Consulting provider [place] or covering provider during after hours [place], for this patient?  Check the care team in [facility] and look for a) attending/consulting TRH provider listed and b) the [facility] team listed. Page or secure chat [phone]. Log into [email or web address] and use [place]'s universal password to access. If you do not have the password, please contact the hospital operator. Locate the [facility] provider you are looking for under [facility] and page to a number that you can be directly reached. If you still have difficulty reaching the provider, please page the [facility] (Director on Call) for the Hospitalists listed  on amion for assistance.

## 2020-12-16 ENCOUNTER — Other Ambulatory Visit (HOSPITAL_COMMUNITY): Payer: Self-pay

## 2020-12-16 DIAGNOSIS — R638 Other symptoms and signs concerning food and fluid intake: Secondary | ICD-10-CM

## 2020-12-16 DIAGNOSIS — Z711 Person with feared health complaint in whom no diagnosis is made: Secondary | ICD-10-CM

## 2020-12-16 LAB — CBC WITH DIFFERENTIAL/PLATELET
Abs Immature Granulocytes: 0.64 10*3/uL — ABNORMAL HIGH (ref 0.00–0.07)
Basophils Absolute: 0 10*3/uL (ref 0.0–0.1)
Basophils Relative: 0 %
Eosinophils Absolute: 0.1 10*3/uL (ref 0.0–0.5)
Eosinophils Relative: 0 %
HCT: 43.8 % (ref 39.0–52.0)
Hemoglobin: 13.9 g/dL (ref 13.0–17.0)
Immature Granulocytes: 2 %
Lymphocytes Relative: 2 %
Lymphs Abs: 0.5 10*3/uL — ABNORMAL LOW (ref 0.7–4.0)
MCH: 28.5 pg (ref 26.0–34.0)
MCHC: 31.7 g/dL (ref 30.0–36.0)
MCV: 89.8 fL (ref 80.0–100.0)
Monocytes Absolute: 1.7 10*3/uL — ABNORMAL HIGH (ref 0.1–1.0)
Monocytes Relative: 6 %
Neutro Abs: 23.3 10*3/uL — ABNORMAL HIGH (ref 1.7–7.7)
Neutrophils Relative %: 90 %
Platelets: 215 10*3/uL (ref 150–400)
RBC: 4.88 MIL/uL (ref 4.22–5.81)
RDW: 15 % (ref 11.5–15.5)
WBC: 26.3 10*3/uL — ABNORMAL HIGH (ref 4.0–10.5)
nRBC: 0 % (ref 0.0–0.2)

## 2020-12-16 LAB — COMPREHENSIVE METABOLIC PANEL
ALT: 15 U/L (ref 0–44)
AST: 22 U/L (ref 15–41)
Albumin: 2.7 g/dL — ABNORMAL LOW (ref 3.5–5.0)
Alkaline Phosphatase: 60 U/L (ref 38–126)
Anion gap: 13 (ref 5–15)
BUN: 55 mg/dL — ABNORMAL HIGH (ref 8–23)
CO2: 28 mmol/L (ref 22–32)
Calcium: 9 mg/dL (ref 8.9–10.3)
Chloride: 94 mmol/L — ABNORMAL LOW (ref 98–111)
Creatinine, Ser: 2.35 mg/dL — ABNORMAL HIGH (ref 0.61–1.24)
GFR, Estimated: 26 mL/min — ABNORMAL LOW (ref >60)
Glucose, Bld: 157 mg/dL — ABNORMAL HIGH (ref 70–99)
Potassium: 4.1 mmol/L (ref 3.5–5.1)
Sodium: 135 mmol/L (ref 135–145)
Total Bilirubin: 0.8 mg/dL (ref 0.3–1.2)
Total Protein: 8.1 g/dL (ref 6.5–8.1)

## 2020-12-16 LAB — MAGNESIUM: Magnesium: 2.3 mg/dL (ref 1.7–2.4)

## 2020-12-16 LAB — PROCALCITONIN: Procalcitonin: 2.06 ng/mL

## 2020-12-16 MED ORDER — AMIODARONE LOAD VIA INFUSION
150.0000 mg | Freq: Once | INTRAVENOUS | Status: AC
  Administered 2020-12-16: 150 mg via INTRAVENOUS
  Filled 2020-12-16: qty 83.34

## 2020-12-16 MED ORDER — AMIODARONE HCL IN DEXTROSE 360-4.14 MG/200ML-% IV SOLN
30.0000 mg/h | INTRAVENOUS | Status: DC
  Administered 2020-12-17 (×2): 30 mg/h via INTRAVENOUS
  Filled 2020-12-16 (×5): qty 200

## 2020-12-16 MED ORDER — AMIODARONE HCL 200 MG PO TABS: 200.0000 mg | ORAL_TABLET | Freq: Every day | ORAL | Status: DC

## 2020-12-16 MED ORDER — AMIODARONE HCL 200 MG PO TABS
200.0000 mg | ORAL_TABLET | Freq: Two times a day (BID) | ORAL | Status: DC
  Administered 2020-12-16: 200 mg via ORAL
  Filled 2020-12-16 (×2): qty 1

## 2020-12-16 MED ORDER — AMIODARONE HCL IN DEXTROSE 360-4.14 MG/200ML-% IV SOLN
60.0000 mg/h | INTRAVENOUS | Status: AC
  Administered 2020-12-16 (×2): 60 mg/h via INTRAVENOUS
  Filled 2020-12-16: qty 200

## 2020-12-16 MED ORDER — APIXABAN 2.5 MG PO TABS
2.5000 mg | ORAL_TABLET | Freq: Two times a day (BID) | ORAL | Status: DC
  Administered 2020-12-16 – 2020-12-18 (×4): 2.5 mg via ORAL
  Filled 2020-12-16 (×4): qty 1

## 2020-12-16 MED ORDER — PIPERACILLIN-TAZOBACTAM IN DEX 2-0.25 GM/50ML IV SOLN
2.2500 g | Freq: Three times a day (TID) | INTRAVENOUS | Status: DC
  Administered 2020-12-16 – 2020-12-19 (×9): 2.25 g via INTRAVENOUS
  Filled 2020-12-16 (×12): qty 50

## 2020-12-16 NOTE — Progress Notes (Signed)
Daily Progress Note   Patient Name: [name]       Date: 12/16/2020 DOB: 12-03-1930  Age: 87 y.o. MRN#: [id number] Attending Physician: Darliss Cheney, MD Primary Care Physician: Gaynelle Arabian, MD Admit Date: 12/23/2020  Reason for Consultation/Follow-up: Establishing GOC  Subjective: 10:30 AM Notified by Dr. Doristine Bosworth that patient has declined - now with uncontrolled afib with HR around 140. He spoke with son around 2 hours ago and encouraged family to discuss goals as decision for aggressive vs comfort may need to be made prior to 4:30p scheduled meeting.   Went to see if family were at bedside - son/Randy was present. Reviewed information discussed with Dr. Doristine Bosworth. Louie Casa spoke with family and their goal is to keep patient stable until 4:30p meeting with PMT - they understand he is at high risk for decline and they are ok with escalation of care if needed during this time. Offered to call family for conference call. Louie Casa tells me there are many family members that are coming today and they prefer to meet in person. He also states patient's wife is about to start her dialysis treatment and would not be available to talk until after. Offered to meet family before 4:30p if they are available - he will call PMT if able to meet before 4:30p.  4:30 PM Chart review performed. Receive report from primary RN - no acute concerns.  Met family at patient's bedside - 3 sons, DIL, grandson, and patient's wife present.   Patient was lying in bed - he wakes to voice/gentle touch but promptly falls back asleep and is not able to participate in [place] conversation today. No signs or non-verbal gestures of pain or discomfort noted. No respiratory distress, increased work of breathing, or secretions noted.    Met with family  to discuss diagnosis, prognosis, GOC, EOL wishes, disposition, and options.  I introduced Palliative Medicine as specialized medical care for people living with serious illness. It focuses on providing relief from the symptoms and stress of a serious illness. The goal is to improve quality of life for both the patient and the family.  Patient's interval history since admission was reviewed. Reviewed MBS results showing silent trace aspiration/swallowing dysfunction. Reviewed patient is still with significant left upper extremity weakness and some weakness in left lower extremity. Reviewed cardiology  recommendations earlier this admission to diurese as kidneys tolerate; however, reviewed that kidney function is now decreasing.  We discussed patient's current illness and what it means in the larger context of patient's on-going co-morbidities. Family have a clear understanding of patient's acute medical situation including kidney decline and limited additional interventions for HR control. Natural disease trajectory and expectations at EOL were discussed. I attempted to elicit values and goals of care important to the patient. The difference between aggressive medical intervention and comfort care was considered in light of the patient's goals of care.  Hospice was explained and offered. They understand that discharging without hospice puts patient at high risk for rehospitalization.   Allowed family to express thoughts and feelings related to patient's medical situation. Wife and all family agree that they would prefer the patient return home with hospice. However, son/Charles would like Cardiology to see again - he would like to know if there are any other options. Patient's wife and family are agreeable for cardiology to offer input before making decision for comfort care/hospice.  Family wished to re-discuss code status - after discussions, confirmed DNR/DNI.  Discussed with  patient/family the importance of continued conversation with each other and the medical providers regarding overall plan of care and treatment options, ensuring decisions are within the context of the patient's values and GOCs.    Questions and concerns were addressed. The patient/family was encouraged to call with questions and/or concerns. PMT card was provided.   Length of Stay: 5  Current Medications: Scheduled Meds:  . amiodarone  150 mg Intravenous Once  . amiodarone  200 mg Oral Q12H   Followed by  . [START ON 12/24/2020] amiodarone  200 mg Oral Daily  . apixaban  5 mg Oral BID  . atorvastatin  20 mg Oral Daily  . Chlorhexidine Gluconate Cloth  6 each Topical Daily  . diltiazem  30 mg Oral Q8H  . furosemide  60 mg Intravenous BID  . metoprolol succinate  100 mg Oral Daily  . sodium chloride flush  3 mL Intravenous Q12H    Continuous Infusions: . sodium chloride Stopped (12/14/20 1151)  . amiodarone     Followed by  . amiodarone    . doxycycline (VIBRAMYCIN) IV 100 mg (12/16/20 0916)    PRN Meds: sodium chloride, acetaminophen **OR** acetaminophen, albuterol, metoprolol tartrate, metoprolol tartrate, ondansetron (ZOFRAN) IV, polyethylene glycol  Physical Exam Vitals and nursing note reviewed.  Constitutional:      General: He is not in acute distress.    Appearance: He is ill-appearing.  Pulmonary:     Effort: No respiratory distress.  Skin:    General: Skin is warm and dry.  Neurological:     Mental Status: He is lethargic.     Motor: Weakness present.  Psychiatric:        Speech: He is noncommunicative.        Behavior: Behavior is cooperative.            Vital Signs: BP 116/61   Pulse (!) 132   Temp 98.3 F (36.8 C) (Axillary)   Resp (!) 23   Ht 6' 1"  (1.854 m)   Wt 87.1 kg   SpO2 95%   BMI 25.33 kg/m  SpO2: SpO2: 95 % O2 Device: O2 Device: Nasal Cannula O2 Flow Rate: O2 Flow Rate (L/min): 6 L/min  Intake/output summary:  Intake/Output Summary  (Last 24 hours) at 12/16/2020 1114 Last data filed at 12/16/2020 0841 Gross per 24 hour  Intake  270 ml  Output 300 ml  Net -30 ml   LBM: Last BM Date: 12/14/20 Baseline Weight: Weight: 91.5 kg Most recent weight: Weight: 87.1 kg       Palliative Assessment/Data: PPS 10-20%    Flowsheet Rows    Flowsheet Row Most Recent Value  Intake Tab   Referral Department Hospitalist  Unit at Time of Referral Med/Surg Unit  Palliative Care Primary Diagnosis Cardiac  Date Notified 12/15/20  Palliative Care Type New Palliative care  Reason for referral Clarify Goals of Care  Date of Admission 01/02/2021  Date first seen by Palliative Care 12/15/20  # of days Palliative referral response time 0 Day(s)  # of days IP prior to Palliative referral 4  Clinical Assessment   Psychosocial & Spiritual Assessment   Palliative Care Outcomes   Patient/Family meeting held? No  [scheduled family meeting for tomorrow 12/6]       Patient Active Problem List   Diagnosis Date Noted  . Hypokalemia 12/14/2020  . Acute on chronic diastolic CHF (congestive heart failure) ([place]) 12/14/2020  . CHF exacerbation ([place]) 01/08/2021  . Hyponatremia 10/14/2020  . Hyperbilirubinemia 10/14/2020  . Acute respiratory failure with hypoxia ([place]) 10/14/2020  . (HFpEF) heart failure with preserved ejection fraction ([place]) 10/14/2020  . Subdural hematoma 10/22/2015  . Thrombocytopenia ([place]) 10/22/2015  . Chronic venous stasis dermatitis 10/22/2015  . Fall at home, initial encounter   . Head injury   . Essential hypertension 01/21/2014  . Hx of Embolic stroke ([place]) 16/01/930  . Long term current use of anticoagulant therapy 12/25/2012  . Atrial fibrillation ([place]) 10/15/2012    Palliative Care Assessment & Plan   Patient Profile: 87 y.o. male  with past medical history of CHF, atrial fibrillation, venous stasis, hypertension, history of CVA, thrombocytopenia presented to [place] ED on 01/04/2021 from home  with complaints of hypoxia and shortness of breath. Patient was admitted to [facility] on 01/05/2021 with acute on chronic CHF exacerbation, acute hypoxic respiratory failure, and possible pneumonia. Cardiology was consulted - recommended diuresing as kidneys tolerated. During admission on 12/13/20 while working with PT, code stroke was initiated due to noted mild left facial droop, mild dysarthria, LUE plegia, and LLE weakness which was far from baseline. Patient was evaluated by Neurology - stat head CT was unremarkable; however, examination findings were most consistent with a right MCA territory stroke - TNK was administered.   Assessment: Acute hypoxic respiratory failure secondary to acute on chronic CHF Aspiration pneumonia Acute renal failure Permanent/persistent atrial fibrillation with RVR Acute encephalopathy Acute ischemic stroke Moderate oropharyngeal dysphasia Concern about end of life  Recommendations/Plan Continue full scope medical treatment with watchful waiting Continue DNR/DNI  Family's goal is to have patient re-assessed by Cardiology - they will make stepwise decisions based on recommendations Family are open to comfort/hospice care if there is not much else medically that can be offered from Cardiology and/or if patient continues to decline Family understand patient is at high risk for decline due to multiple acute medical conditions Ongoing GOC pending clinical course  PMT will follow up with family after Cardiology provides updated recommendations    Goals of Care and Additional Recommendations: Limitations on Scope of Treatment: Full Scope Treatment  Code Status:    Code Status Orders  (From admission, onward)           Start     Ordered   12/15/20 1049  Do not attempt resuscitation (DNR)  Continuous  Question Answer Comment  In the event of cardiac or respiratory ARREST Do not call a "code blue"   In the event of cardiac or respiratory ARREST Do not  perform Intubation, CPR, defibrillation or ACLS   In the event of cardiac or respiratory ARREST Use medication by any route, position, wound care, and other measures to relive pain and suffering. May use oxygen, suction and manual treatment of airway obstruction as needed for comfort.      12/15/20 1048           Code Status History     Date Active Date Inactive Code Status Order ID Comments User Context   12/28/2020 2211 12/15/2020 1048 Full Code [id number]  Marcelyn Bruins, MD Inpatient   10/14/2020 1217 10/22/2020 2033 Full Code [id number]  Norval Morton, MD ED   10/22/2015 0034 10/22/2015 1831 Full Code [id number]  Vianne Bulls, MD ED       Prognosis:  < 4 weeks  Discharge Planning: To Be Determined  Care plan was discussed with primary RN, Dr. Doristine Bosworth, patient's family  Thank you for allowing the Palliative Medicine Team to assist in the care of this patient.   Total Time 90 minutes Prolonged Time Billed  yes       Greater than 50%  of this time was spent counseling and coordinating care related to the above assessment and plan.  Lin Landsman, NP  Please contact Palliative Medicine Team phone at [phone] for questions and concerns.

## 2020-12-16 NOTE — Progress Notes (Signed)
Age>87yo, scr now increased to >1.5. Ok to decrease apixaban to 2.5mg  PO BID. Also can add 5 days stop date to doxycycline per Dr. Doristine Bosworth.  Onnie Boer, PharmD, BCIDP, AAHIVP, CPP Infectious Disease Pharmacist 12/16/2020 12:46 PM

## 2020-12-16 NOTE — Progress Notes (Signed)
Pharmacy Antibiotic Note  [name] is a 87 y.o. male admitted on 12/17/2020 with pneumonia.  Pharmacy has been consulted for Zosyn dosing.  Pt is decompensating. Doxy has been dced and broadening out to zosyn to cover for PNA. His scr increased from 1.22 to 2.35 today.   Plan: Zosyn 2.25g IV q8  Height: 6\' 1"  (185.4 cm) Weight: 87.1 kg (192 lb 0.3 oz) IBW/kg (Calculated) : 79.9  Temp (24hrs), Avg:99.2 F (37.3 C), Min:98.3 F (36.8 C), Max:100.6 F (38.1 C)  Recent Labs  Lab 12/10/2020 1400 12/22/2020 1550 12/12/2020 2123 12/12/20 0053 12/12/20 1423 12/13/20 0200 12/13/20 1250 12/13/20 1506 12/14/20 0340 12/15/20 0207 12/16/20 0919  WBC 7.9  --   --  6.9  --  11.1* 12.3*  --  9.8  --  26.3*  CREATININE 0.96  --    < > 1.03 1.17  --  1.19  --  1.25* 1.22 2.35*  LATICACIDVEN 1.3 1.4  --   --   --   --  1.6 1.3  --   --   --    < > = values in this interval not displayed.    Estimated Creatinine Clearance: 24.6 mL/min (A) (by C-G formula based on SCr of 2.35 mg/dL (H)).    No Known Allergies  Antimicrobials this admission: Doxy IV 12/4 >>12/7  Cefepime 12/4>>12/5 Zosyn 12/7>>  Dose adjustments this admission:   Microbiology results: 12/4 MRSA PCR: neg  12/4 COVID and flu: neg  Onnie Boer, PharmD, BCIDP, AAHIVP, CPP Infectious Disease Pharmacist 12/16/2020 3:17 PM

## 2020-12-16 NOTE — TOC Benefit Eligibility Note (Signed)
Patient Advocate Encounter ° °Insurance verification completed.   ° °The patient is currently admitted and upon discharge could be taking Eliquis 5 mg. ° °The current 30 day co-pay is, $47.00.  ° °The patient is insured through [employer] Medicare Part D  ° ° ° °Kameelah Minish, CPhT °Pharmacy Patient Advocate Specialist °[facility] Patient Advocate Team °Direct Number: [phone]  Fax: [phone] ° ° ° ° ° °  °

## 2020-12-16 NOTE — Progress Notes (Signed)
  Amiodarone Drug - Drug Interaction Consult Note  Recommendations: Monitor for muscle pain on atorvastatin Monitor for bradycardia while on Toprol and diltiazem  Amiodarone is metabolized by the cytochrome P450 system and therefore has the potential to cause many drug interactions. Amiodarone has an average plasma half-life of 50 days (range 20 to 100 days).   There is potential for drug interactions to occur several weeks or months after stopping treatment and the onset of drug interactions may be slow after initiating amiodarone.   [x]  Statins: Increased risk of myopathy. Simvastatin- restrict dose to 20mg  daily. Other statins: counsel patients to report any muscle pain or weakness immediately.  [x]  Anticoagulants: Amiodarone can increase anticoagulant effect. Consider warfarin dose reduction. Patients should be monitored closely and the dose of anticoagulant altered accordingly, remembering that amiodarone levels take several weeks to stabilize.  []  Antiepileptics: Amiodarone can increase plasma concentration of phenytoin, the dose should be reduced. Note that small changes in phenytoin dose can result in large changes in levels. Monitor patient and counsel on signs of toxicity.  [x]  Beta blockers: increased risk of bradycardia, AV block and myocardial depression. Sotalol - avoid concomitant use.  [x]   Calcium channel blockers (diltiazem and verapamil): increased risk of bradycardia, AV block and myocardial depression.  []   Cyclosporine: Amiodarone increases levels of cyclosporine. Reduced dose of cyclosporine is recommended.  []  Digoxin dose should be halved when amiodarone is started.  []  Diuretics: increased risk of cardiotoxicity if hypokalemia occurs.  []  Oral hypoglycemic agents (glyburide, glipizide, glimepiride): increased risk of hypoglycemia. Patient's glucose levels should be monitored closely when initiating amiodarone therapy.   []  Drugs that prolong the QT interval:   Torsades de pointes risk may be increased with concurrent use - avoid if possible.  Monitor QTc, also keep magnesium/potassium WNL if concurrent therapy can't be avoided.  Antibiotics: e.g. fluoroquinolones, erythromycin.  Antiarrhythmics: e.g. quinidine, procainamide, disopyramide, sotalol.  Antipsychotics: e.g. phenothiazines, haloperidol.   Lithium, tricyclic antidepressants, and methadone.

## 2020-12-16 NOTE — Progress Notes (Signed)
Brief Palliative Medicine Progress Note:  Family meeting held and [place] completed. Full note to follow.  Recommendations/Plan Continue full scope medical treatment with watchful waiting Continue DNR/DNI  Family's goal is to have patient re-assessed by Cardiology - they will make stepwise decisions based on recommendations Family are open to comfort/hospice care if there is not much else medically that can be offered from Cardiology and/or if patient continues to decline Family understand patient is at high risk for decline due to multiple acute medical conditions Ongoing GOC pending clinical course  PMT will follow up with family after Cardiology provides updated recommendations  Thank you for allowing PMT to assist in the care of this patient.  Falisha Osment M. Tamala Julian [facility] Palliative Medicine Team Team Phone: [phone] NO CHARGE

## 2020-12-16 NOTE — Progress Notes (Addendum)
PROGRESS NOTE    [name]  BWG:[id number] DOB: [birth year] DOA: 12/28/2020 PCP: Gaynelle Arabian, MD   Brief Narrative:  87 years old male with past medical history of CHF, persistent atrial fibrillation, hypertension, history of CVA, thrombocytopenia, came with a chief complaint of shortness of breath.  The symptoms started 1 week ago. His saturation dropped at home in the 70s. In the emergency room he was found to be in congestive heart failure, with a BNP elevated 231 troponin elevated at 41 trended down to 38 on repeat Respiratory panel negative chest x-ray showed edema Patient received Lasix IV 80 mg, DuoNebs, metoprolol, INR subtherapeutic 1.6. Patient used to take Lasix 40 mg daily BUN and creatinine were normal  Assessment & Plan:   Active Problems:   Atrial fibrillation (HCC)   Hx of Embolic stroke (HCC)   Essential hypertension   Chronic venous stasis dermatitis   Hypokalemia   Acute on chronic diastolic CHF (congestive heart failure) (HCC)  Acute hypoxic respiratory failure secondary to acute on chronic congestive heart failure with preserved ejection fraction: Still requiring as needed BiPAP and currently 6 L of oxygen.  Not much of urine output, only 300 cc in last 24 hours charted if that is accurate.  He is net 8.4 L since admission.  Repeat chest x-ray 12/14/2020 does not show much improvement, he also developed fever of 100.6 last night.  Repeat procalcitonin significantly elevated over 2.  I suspect aspiration pneumonia.  His creatinine also significantly elevated.  Overall, patient is declining very fast.  I will start him on Zosyn for possible aspiration pneumonia.  I will discontinue his Lasix for now.  Bilateral upper lungs obesity: COVID, influenza negative.  At that time, procalcitonin unremarkable.  Lactic acid normal.  No leukocytosis.  He was afebrile.  I had doubts that he had any pneumonia.  Patient has worsened in last 24 hours with elevated procalcitonin  and low-grade fever of 100.6, raising suspicion for possible aspiration pneumonia.  Now I am starting him on Zosyn.  Hypokalemia: Resolved.  Acute renal failure: Sudden and significant rise in in his creatinine to 2.35 secondary to his acute illness.  We will discontinue Lasix and avoid any nephrotoxic agents.  Permanent/persistent atrial fibrillation: Patient's heart rate still elevated at around 130s this morning when I saw him.  Patient was lethargic, unable to provide any history.  Son at the bedside.  Patient did receive his oral short acting Cardizem as well as Toprol-XL and heart rate remained elevated.  I had a lengthy discussion with the son about how aggressive they are wanting for the medical team to be for this patient.  The son at that time did not have any answer and he wanted to discuss with the family.  2 hours later, I was informed that at this point in time they want to be aggressive until they make a final decision when they meet with the palliative care later today.  I have started this patient on amiodarone for now.  If after the meeting, they decide to continue to be aggressive, we will consult cardiology.  Acute encephalopathy: Patient is too lethargic to have any conversation or allow any assessment.  Acute ischemic stroke: On 12/13/2020, patient was noted to have left-sided weakness.  He was last known normal at around 9:30 AM the same day.  Code stroke was called, neurology was consulted, patient underwent stroke work-up.  Initial CT head was negative however due to impressive stroke findings on clinical  exam, patient received TNK, subsequently MRI showed small to moderate size right frontotemporal infarct.  Repeat head CT today does not show any difference.  Patient is still with significant left upper extremity weakness and some weakness in the left lower extremity.  On Eliquis now.  On statin.  Management deferred to neurology.  Moderate oropharyngeal dysphagia: Had issues  with swallowing at multiple times.  Now s/p MBS 12/15/2020, diet downgraded to dysphagia 1 diet.  Hyperlipidemia: Continue atorvastatin.  GOC: Palliative care meeting with the family later today.  Patient is very sick and continues to get worse with multiorgan failure now.  I discussed with the son this morning as well.  DVT prophylaxis: Eliquis   Code Status: DNR  Family Communication: Discussed with son at the bedside  Status is: Inpatient  Remains inpatient appropriate because: Needs further inpatient management of a stroke and CHF.  Estimated body mass index is 25.33 kg/m as calculated from the following:   Height as of this encounter: 6\' 1"  (1.854 m).   Weight as of this encounter: 87.1 kg.  Nutritional Assessment: Body mass index is 25.33 kg/m.Marland Kitchen Seen by dietician.  I agree with the assessment and plan as outlined below: Nutrition Status:   Skin Assessment: I have examined the patient's skin and I agree with the wound assessment as performed by the wound care RN as outlined below:    Consultants:  Neurology  Procedures:  None  Antimicrobials:  Anti-infectives (From admission, onward)    Start     Dose/Rate Route Frequency Ordered Stop   12/13/20 2215  ceFEPIme (MAXIPIME) 2 g in sodium chloride 0.9 % 100 mL IVPB  Status:  Discontinued        2 g 200 mL/hr over 30 Minutes Intravenous Every 12 hours 12/13/20 2121 12/14/20 1155   12/13/20 1930  doxycycline (VIBRAMYCIN) 100 mg in sodium chloride 0.9 % 250 mL IVPB        100 mg 125 mL/hr over 120 Minutes Intravenous Every 12 hours 12/13/20 1835 12/18/20 2159   12/13/20 1230  ceFEPIme (MAXIPIME) 1 g in sodium chloride 0.9 % 100 mL IVPB  Status:  Discontinued        1 g 200 mL/hr over 30 Minutes Intravenous Every 12 hours 12/13/20 1131 12/13/20 2121          Subjective:  's seen and examined.  Patient too lethargic to have conversation, on oxygen mask.  Objective: Vitals:   12/16/20 0300 12/16/20 0334 12/16/20  0734 12/16/20 0906  BP:  104/66  116/61  Pulse:  (!) 109 (!) 111 (!) 132  Resp: (!) 24 (!) 23    Temp:  98.7 F (37.1 C) 98.3 F (36.8 C)   TempSrc:  Axillary Axillary   SpO2: 96% 95%    Weight:      Height:        Intake/Output Summary (Last 24 hours) at 12/16/2020 1449 Last data filed at 12/16/2020 0841 Gross per 24 hour  Intake 150 ml  Output 300 ml  Net -150 ml    Filed Weights   12/12/20 0545 12/13/20 0344 12/14/20 0900  Weight: 92.5 kg 90.2 kg 87.1 kg    Examination:  General exam: Appears lethargic but comfortable. Respiratory system: Diminished breath sounds with poor inspiratory effort. Cardiovascular system: S1 & S2 heard, irregularly irregular rate and rhythm.. No JVD, murmurs, rubs, gallops or clicks. No pedal edema. Gastrointestinal system: Abdomen is nondistended, soft and nontender. No organomegaly or masses felt. Normal bowel sounds  heard.  Data Reviewed: I have personally reviewed following labs and imaging studies  CBC: Recent Labs  Lab 12/10/2020 1400 12/12/20 0053 12/13/20 0200 12/13/20 1049 12/13/20 1250 12/14/20 0340 12/16/20 0919  WBC 7.9 6.9 11.1*  --  12.3* 9.8 26.3*  NEUTROABS 6.0  --   --   --   --  8.0* 23.3*  HGB 13.9 13.6 13.7  --  13.8 13.4 13.9  HCT 43.4 42.3 43.5  --  42.9 42.5 43.8  MCV 87.7 88.7 89.5  --  88.5 89.5 89.8  PLT PLATELET CLUMPS NOTED ON SMEAR, UNABLE TO ESTIMATE PLATELET CLUMPS NOTED ON SMEAR, UNABLE TO ESTIMATE PLATELET CLUMPS NOTED ON SMEAR, UNABLE TO ESTIMATE PLATELET CLUMPS NOTED ON SMEAR, UNABLE TO ESTIMATE PLATELET CLUMPS NOTED ON SMEAR, UNABLE TO ESTIMATE PLATELET CLUMPS NOTED ON SMEAR, UNABLE TO ESTIMATE 001    Basic Metabolic Panel: Recent Labs  Lab 12/14/2020 1550 12/10/2020 2123 12/12/20 1423 12/13/20 1250 12/14/20 0340 12/15/20 0207 12/16/20 0919  NA  --    < > 137 132* 134* 129* 135  K  --    < > 3.8 3.5 3.4* 4.1 4.1  CL  --    < > 95* 91* 92* 89* 94*  CO2  --    < > 33* 32 32 27 28  GLUCOSE  --     < > 87 138* 93 188* 157*  BUN  --    < > 12 19 22  24* 55*  CREATININE  --    < > 1.17 1.19 1.25* 1.22 2.35*  CALCIUM  --    < > 9.0 9.0 8.9 8.9 9.0  MG 1.9  --  2.0 2.0  --   --  2.3   < > = values in this interval not displayed.    GFR: Estimated Creatinine Clearance: 24.6 mL/min (A) (by C-G formula based on SCr of 2.35 mg/dL (H)). Liver Function Tests: Recent Labs  Lab 12/26/2020 1400 12/12/20 0053 12/13/20 1250 12/16/20 0919  AST 23 27 31 22   ALT 16 19 17 15   ALKPHOS 55 58 58 60  BILITOT 0.8 0.9 1.8* 0.8  PROT 8.6* 7.9 7.8 8.1  ALBUMIN 4.0 3.2* 3.1* 2.7*    No results for input(s): LIPASE, AMYLASE in the last 168 hours. No results for input(s): AMMONIA in the last 168 hours. Coagulation Profile: Recent Labs  Lab 12/17/2020 1400 12/12/20 0053 12/13/20 0200 12/14/20 0340 12/15/20 0207  INR 1.6* 1.6* 1.6* 1.9* 2.0*    Cardiac Enzymes: No results for input(s): CKTOTAL, CKMB, CKMBINDEX, TROPONINI in the last 168 hours. BNP (last 3 results) No results for input(s): PROBNP in the last 8760 hours. HbA1C: Recent Labs    12/15/20 0207  HGBA1C 6.4*    CBG: Recent Labs  Lab 12/13/20 1044 12/14/20 0728  GLUCAP 166* 80    Lipid Profile: Recent Labs    12/15/20 0207  CHOL 114  HDL 46  LDLCALC 55  TRIG 65  CHOLHDL 2.5    Thyroid Function Tests: No results for input(s): TSH, T4TOTAL, FREET4, T3FREE, THYROIDAB in the last 72 hours. Anemia Panel: No results for input(s): VITAMINB12, FOLATE, FERRITIN, TIBC, IRON, RETICCTPCT in the last 72 hours. Sepsis Labs: Recent Labs  Lab 01/07/2021 1400 12/22/2020 1550 12/13/20 1250 12/13/20 1506 12/16/20 0919  PROCALCITON  --   --  0.11  --  2.06  LATICACIDVEN 1.3 1.4 1.6 1.3  --      Recent Results (from the past 240 hour(s))  Resp  Panel by RT-PCR (Flu A&B, Covid) Nasopharyngeal Swab     Status: None   Collection Time: 12/18/2020  2:00 PM   Specimen: Nasopharyngeal Swab; Nasopharyngeal(NP) swabs in vial transport  medium  Result Value Ref Range Status   SARS Coronavirus 2 by RT PCR NEGATIVE NEGATIVE Final    Comment: (NOTE) SARS-CoV-2 target nucleic acids are NOT DETECTED.  The SARS-CoV-2 RNA is generally detectable in upper respiratory specimens during the acute phase of infection. The lowest concentration of SARS-CoV-2 viral copies this assay can detect is 138 copies/mL. A negative result does not preclude SARS-Cov-2 infection and should not be used as the sole basis for treatment or other patient management decisions. A negative result may occur with  improper specimen collection/handling, submission of specimen other than nasopharyngeal swab, presence of viral mutation(s) within the areas targeted by this assay, and inadequate number of viral copies(<138 copies/mL). A negative result must be combined with clinical observations, patient history, and epidemiological information. The expected result is Negative.  Fact Sheet for Patients:  EntrepreneurPulse.com.au  Fact Sheet for Healthcare Providers:  IncredibleEmployment.be  This test is no t yet approved or cleared by the [place] FDA and  has been authorized for detection and/or diagnosis of SARS-CoV-2 by FDA under an Emergency Use Authorization (EUA). This EUA will remain  in effect (meaning this test can be used) for the duration of the COVID-19 declaration under Section 564(b)(1) of the Act, 21 U.S.C.section 360bbb-3(b)(1), unless the authorization is terminated  or revoked sooner.       Influenza A by PCR NEGATIVE NEGATIVE Final   Influenza B by PCR NEGATIVE NEGATIVE Final    Comment: (NOTE) The Xpert Xpress SARS-CoV-2/FLU/RSV plus assay is intended as an aid in the diagnosis of influenza from Nasopharyngeal swab specimens and should not be used as a sole basis for treatment. Nasal washings and aspirates are unacceptable for Xpert Xpress SARS-CoV-2/FLU/RSV testing.  Fact Sheet for  Patients: EntrepreneurPulse.com.au  Fact Sheet for Healthcare Providers: IncredibleEmployment.be  This test is not yet approved or cleared by the [place] FDA and has been authorized for detection and/or diagnosis of SARS-CoV-2 by FDA under an Emergency Use Authorization (EUA). This EUA will remain in effect (meaning this test can be used) for the duration of the COVID-19 declaration under Section 564(b)(1) of the Act, 21 U.S.C. section 360bbb-3(b)(1), unless the authorization is terminated or revoked.  Performed at [facility], [address], [place], [place] [address]   MRSA Next Gen by PCR, Nasal     Status: None   Collection Time: 12/13/20  3:01 PM   Specimen: Nasal Mucosa; Nasal Swab  Result Value Ref Range Status   MRSA by PCR Next Gen NOT DETECTED NOT DETECTED Final    Comment: (NOTE) The GeneXpert MRSA Assay (FDA approved for NASAL specimens only), is one component of a comprehensive MRSA colonization surveillance program. It is not intended to diagnose MRSA infection nor to guide or monitor treatment for MRSA infections. Test performance is not FDA approved in patients less than 64 years old. Performed at [facility], [place] [address], [place], [place] [address]        Radiology Studies: DG Swallowing Func-Speech Pathology  Result Date: 12/15/2020 Table formatting from the original result was not included. Objective Swallowing Evaluation: Type of Study: MBS-Modified Barium Swallow Study  Patient Details Name: GERRITT GALENTINE MRN: [id number] Date of Birth: 12/18/1930 Today's Date: 12/15/2020 Time: SLP Start Time (ACUTE ONLY): 9379 -SLP Stop Time (  ACUTE ONLY): 2094 SLP Time Calculation (min) (ACUTE ONLY): 15 min Past Medical History: Past Medical History: Diagnosis Date  Atrial fibrillation ([place]) 10/15/2012  Cancer ([place])   Skin (R hand)  Embolic stroke ([place]) 70/96/2836  Essential hypertension 01/21/2014  Long  term current use of anticoagulant therapy 12/25/2012 Past Surgical History: Past Surgical History: Procedure Laterality Date  APPENDECTOMY    CARDIAC CATHETERIZATION    CYSTOSCOPY WITH INSERTION OF UROLIFT N/A 10/10/2017  Procedure: CYSTOSCOPY WITH INSERTION OF UROLIFT;  Surgeon: [name], MD;  Location: [facility];  Service: Urology;  Laterality: N/A;  NEEDS 60 MIN  EYE SURGERY Bilateral 2013  Cataracts HPI: Pt is an 86 y.o. male who presented initially  with ongoing shortness of breath. Dx acute enceophalopathy. CXR 12/3: Increased opacity within the right upper lung, suspicious for pneumonia, alternatively asymmetric edema or atelectasis. Pt was evaluated by SLP on 9/4 at 9 am, foudn to be toelrating PO with a possible mild baseline dysphagia. Later that morning a code stroke was called; patient was noted to have left-sided weakness.  He was last known normal at around 9:30 AM the same day.   Initial CT head was negative however due to impressive stroke findings on clinical exam, patient received TNK, subsequently MRI showed small to moderate size right frontotemporal infarct.  PMH: CHF, atrial fibrillation, venous stasis, hypertension, history of CVA, thrombocytopenia.  No data recorded  Recommendations for follow up therapy are one component of a multi-disciplinary discharge planning process, led by the attending physician.  Recommendations may be updated based on patient status, additional functional criteria and insurance authorization. Assessment / Plan / Recommendation Clinical Impressions 12/15/2020 Clinical Impression Pt demonstrates a moderate oropharyngeal dysphagia with slightly delayed/incomplete laryngeal closure prior to swallow with large boluses of nectar and thin liquids. This results in instances of trace silent aspiration of thin and nectar. Pt did have a cough with slightly larger quantity of aspiration. Using a cup to restrict and control bolus size prevented aspiration with thin and  nectar. A chin tuck was not beneficial. Pt additionally had prolonged mastication of solids and required cues to complete oral transit of solids in some cases. Esophageal sweep unremarkable. Suspect pt is having trace aspiration events with liquids, eventually accumulating to finally trigger coughing by the end of the meal. Pt is recommneded to consume a highly conservative diet to prevent coughing and vomiting (seen yesterday) until strength and mentation improves as he is slightly sluggish which may be acutely contributing at this time. Recommend dys 1 (puree) and nectar thick liquids NO STRAWS. Will f/u, recommend AIR. SLP Visit Diagnosis Dysphagia, unspecified (R13.10) Attention and concentration deficit following -- Frontal lobe and executive function deficit following -- Impact on safety and function Moderate aspiration risk   Treatment Recommendations 12/15/2020 Treatment Recommendations Therapy as outlined in treatment plan below   Prognosis 12/15/2020 Prognosis for Safe Diet Advancement Good Barriers to Reach Goals -- Barriers/Prognosis Comment -- Diet Recommendations 12/15/2020 SLP Diet Recommendations Dysphagia 1 (Puree) solids;Nectar thick liquid Liquid Administration via Cup;No straw Medication Administration Whole meds with puree Compensations Slow rate;Small sips/bites;Clear throat intermittently Postural Changes --   Other Recommendations 12/15/2020 Recommended Consults -- Oral Care Recommendations -- Other Recommendations -- Follow Up Recommendations Acute inpatient rehab (3hours/day) Assistance recommended at discharge Frequent or constant Supervision/Assistance Functional Status Assessment Patient has had a recent decline in their functional status and demonstrates the ability to make significant improvements in function in a reasonable and predictable amount of time. Frequency and Duration  12/15/2020 Speech Therapy Frequency (ACUTE ONLY) min 2x/week Treatment Duration 2 weeks   Oral Phase 12/15/2020  Oral Phase Impaired Oral - Pudding Teaspoon -- Oral - Pudding Cup -- Oral - Honey Teaspoon -- Oral - Honey Cup -- Oral - Nectar Teaspoon -- Oral - Nectar Cup WFL Oral - Nectar Straw WFL Oral - Thin Teaspoon -- Oral - Thin Cup WFL Oral - Thin Straw WFL Oral - Puree WFL Oral - Mech Soft -- Oral - Regular Decreased bolus cohesion;Delayed oral transit;Piecemeal swallowing;Lingual/palatal residue;Left pocketing in lateral sulci;Right pocketing in lateral sulci;Reduced posterior propulsion Oral - Multi-Consistency Decreased bolus cohesion;Delayed oral transit;Piecemeal swallowing;Lingual/palatal residue;Left pocketing in lateral sulci;Right pocketing in lateral sulci;Reduced posterior propulsion Oral - Pill Decreased bolus cohesion;Delayed oral transit;Piecemeal swallowing;Lingual/palatal residue;Left pocketing in lateral sulci;Right pocketing in lateral sulci;Reduced posterior propulsion Oral Phase - Comment --  Pharyngeal Phase 12/15/2020 Pharyngeal Phase Impaired Pharyngeal- Pudding Teaspoon -- Pharyngeal -- Pharyngeal- Pudding Cup -- Pharyngeal -- Pharyngeal- Honey Teaspoon -- Pharyngeal -- Pharyngeal- Honey Cup -- Pharyngeal -- Pharyngeal- Nectar Teaspoon -- Pharyngeal -- Pharyngeal- Nectar Cup WFL Pharyngeal -- Pharyngeal- Nectar Straw Penetration/Aspiration before swallow Pharyngeal Material enters airway, passes BELOW cords without attempt by patient to eject out (silent aspiration) Pharyngeal- Thin Teaspoon -- Pharyngeal -- Pharyngeal- Thin Cup Penetration/Aspiration before swallow Pharyngeal Material enters airway, remains ABOVE vocal cords then ejected out Pharyngeal- Thin Straw Penetration/Aspiration before swallow Pharyngeal Material enters airway, passes BELOW cords without attempt by patient to eject out (silent aspiration) Pharyngeal- Puree WFL Pharyngeal -- Pharyngeal- Mechanical Soft -- Pharyngeal -- Pharyngeal- Regular WFL Pharyngeal -- Pharyngeal- Multi-consistency WFL Pharyngeal -- Pharyngeal- Pill WFL  Pharyngeal -- Pharyngeal Comment --  No flowsheet data found. DeBlois, Katherene Ponto 12/15/2020, 11:13 AM                      Scheduled Meds:  amiodarone  200 mg Oral Q12H   Followed by   Derrill Memo ON 12/24/2020] amiodarone  200 mg Oral Daily   apixaban  2.5 mg Oral BID   atorvastatin  20 mg Oral Daily   Chlorhexidine Gluconate Cloth  6 each Topical Daily   diltiazem  30 mg Oral Q8H   furosemide  60 mg Intravenous BID   metoprolol succinate  100 mg Oral Daily   sodium chloride flush  3 mL Intravenous Q12H   Continuous Infusions:  sodium chloride Stopped (12/14/20 1151)   amiodarone 60 mg/hr (12/16/20 1217)   Followed by   amiodarone     doxycycline (VIBRAMYCIN) IV 100 mg (12/16/20 0916)     LOS: 5 days   Time spent: 35 minutes   Darliss Cheney, MD [facility]  12/16/2020, 2:49 PM  Please page via [place] and do not message via secure chat for anything urgent. Secure chat can be used for anything non urgent.  How to contact the [facility] Attending or Consulting provider [place] or covering provider during after hours [place], for this patient?  Check the care team in [facility] and look for a) attending/consulting [facility] provider listed and b) the [facility] team listed. Page or secure chat [phone]. Log into [email or web address] and use [place]'s universal password to access. If you do not have the password, please contact the hospital operator. Locate the [facility] provider you are looking for under [facility] and page to a number that you can be directly reached. If you still have difficulty reaching the provider, please page the [facility] (Director on Call) for the Hospitalists listed on  amion for assistance.

## 2020-12-17 DIAGNOSIS — I10 Essential (primary) hypertension: Secondary | ICD-10-CM

## 2020-12-17 DIAGNOSIS — I4891 Unspecified atrial fibrillation: Secondary | ICD-10-CM

## 2020-12-17 DIAGNOSIS — N179 Acute kidney failure, unspecified: Secondary | ICD-10-CM

## 2020-12-17 LAB — CBC WITH DIFFERENTIAL/PLATELET
Abs Immature Granulocytes: 0.17 10*3/uL — ABNORMAL HIGH (ref 0.00–0.07)
Basophils Absolute: 0 10*3/uL (ref 0.0–0.1)
Basophils Relative: 0 %
Eosinophils Absolute: 0 10*3/uL (ref 0.0–0.5)
Eosinophils Relative: 0 %
HCT: 41.7 % (ref 39.0–52.0)
Hemoglobin: 13.1 g/dL (ref 13.0–17.0)
Immature Granulocytes: 1 %
Lymphocytes Relative: 2 %
Lymphs Abs: 0.4 10*3/uL — ABNORMAL LOW (ref 0.7–4.0)
MCH: 28.3 pg (ref 26.0–34.0)
MCHC: 31.4 g/dL (ref 30.0–36.0)
MCV: 90.1 fL (ref 80.0–100.0)
Monocytes Absolute: 1.4 10*3/uL — ABNORMAL HIGH (ref 0.1–1.0)
Monocytes Relative: 7 %
Neutro Abs: 18.4 10*3/uL — ABNORMAL HIGH (ref 1.7–7.7)
Neutrophils Relative %: 90 %
Platelets: UNDETERMINED 10*3/uL (ref 150–400)
RBC: 4.63 MIL/uL (ref 4.22–5.81)
RDW: 15.1 % (ref 11.5–15.5)
WBC: 20.6 10*3/uL — ABNORMAL HIGH (ref 4.0–10.5)
nRBC: 0 % (ref 0.0–0.2)

## 2020-12-17 LAB — BASIC METABOLIC PANEL
Anion gap: 13 (ref 5–15)
BUN: 79 mg/dL — ABNORMAL HIGH (ref 8–23)
CO2: 28 mmol/L (ref 22–32)
Calcium: 9 mg/dL (ref 8.9–10.3)
Chloride: 95 mmol/L — ABNORMAL LOW (ref 98–111)
Creatinine, Ser: 2.33 mg/dL — ABNORMAL HIGH (ref 0.61–1.24)
GFR, Estimated: 26 mL/min — ABNORMAL LOW (ref >60)
Glucose, Bld: 134 mg/dL — ABNORMAL HIGH (ref 70–99)
Potassium: 3.9 mmol/L (ref 3.5–5.1)
Sodium: 136 mmol/L (ref 135–145)

## 2020-12-17 LAB — MAGNESIUM: Magnesium: 2.5 mg/dL — ABNORMAL HIGH (ref 1.7–2.4)

## 2020-12-17 MED ORDER — DIGOXIN 0.25 MG/ML IJ SOLN
0.1250 mg | INTRAMUSCULAR | Status: DC
  Administered 2020-12-17: 0.125 mg via INTRAVENOUS
  Filled 2020-12-17: qty 0.5

## 2020-12-17 NOTE — Progress Notes (Addendum)
Daily Progress Note   Patient Name: [name]       Date: 12/17/2020 DOB: 07-24-1930  Age: 87 y.o. MRN#: [id number] Attending Physician: Darliss Cheney, MD Primary Care Physician: Gaynelle Arabian, MD Admit Date: 12/12/2020 Length of Stay: 6 days  Reason for Consultation/Follow-up: Establishing goals of care  HPI/Patient Profile:  87 y.o. male  with past medical history of CHF, atrial fibrillation, venous stasis, hypertension, history of CVA, thrombocytopenia presented to [place] ED on 12/10/2020 from home with complaints of hypoxia and shortness of breath. Patient was admitted to [facility] on 12/18/2020 with acute on chronic CHF exacerbation, acute hypoxic respiratory failure, and possible pneumonia. Cardiology was consulted - recommended diuresing as kidneys tolerated. During admission on 12/13/20 while working with PT, code stroke was initiated due to noted mild left facial droop, mild dysarthria, LUE plegia, and LLE weakness which was far from baseline. Patient was evaluated by Neurology - stat head CT was unremarkable; however, examination findings were most consistent with a right MCA territory stroke - TNK was administered.   Has since had significant decline, requiring intermittent BiPap and AFib with uncontrolled rate. Family meeting yesterday most in agreement for comfort/hospice, but son wanted cardiology to weigh in. Cardiaology saw 12/8 and feels palliative not unreasonable given multiple comorbidities, permenant AFib, unlikely to recover baseline physical function, high risk recurrent CVA with diminished QoL.  Family has asked for PMT to revisit.  Subjective:   Subjective: Chart Reviewed. Updates received. Patient Assessed. Created space and opportunity for patient  and family to explore thoughts and feelings regarding current medical situation.  Today's Discussion: I met with the patient's bedside.  The patient is not able to speak much.  His son Louie Casa was also at the bedside.   He reviewed the results of the family meeting yesterday where they understood that there is a lot going on but his brother Juanda Crumble wanted more information from cardiology.  I reviewed the cardiologist finding and that the patient is in permanent A. fib and they will not likely be able to do get him out of A. fib.  His rate has been increased and they are trying to use medications to get it down.  However, amiodarone is a poor choice with concurrent lung disease and digoxin would need to be renally dosed.  We discussed the concept of multiple comorbidities and multiple issues kind of painting people into a corner where it makes decisions and treatment options difficult and limited.  He verbalized understanding of this.  I also shared that his kidney function has taken a hit, likely multifactorial.  His chest x-ray does not show any improvement related to his pneumonia.  We also reviewed the risk of recurrent pneumonia, recurrent strokes.  If aggressive care is desired there is a decent chance that he will be in and out of the hospital ongoing.  He shares that his mom wants to bring him home.  They are considering hospice.  We have made a plan for me to try to contact his brother Juanda Crumble and reviewed what was noted today.  We will try to meet again tomorrow with multiple family members in order to nail down a decision on aggressive care versus comfort care.  I attempted to call son Juanda Crumble to explain cardiology opinion about limited to no additional treatment options.  I left a voicemail for call back.  We will try to touch base again tomorrow.  ADDENDUM: The patient's son Juanda Crumble did call me back.  We had  a frank discussion wherein I reviewed cardiology's opinion, and their assertion that there is really not much left to do other than a couple medications for attempted rate control although these have side effects (especially in light of his lung disease and kidney disease/AKI).  He feels more at peace now.  He  knows that his father will not ever go back to his "normal".  He is going to call his family and discussed with them but he feels that everyone will be on board now regarding moving toward comfort and hospice.  I provided emotional general support through therapeutic listening, empathy, and other techniques.  I answered all questions and addressed all concerns to the best of my ability.  ROS Limited due to CVA/Mental Status Review of Systems  Constitutional:        Denies pain in general  Cardiovascular:  Negative for chest pain.   Objective:   Vital Signs:  BP 109/64 (BP Location: Left Arm)   Pulse (!) 102   Temp 99.4 F (37.4 C) (Oral)   Resp (!) 26   Ht $R'6\' 1"'PX$  (1.854 m)   Wt 87.1 kg   SpO2 92%   BMI 25.33 kg/m   Physical Exam: Physical Exam Vitals and nursing note reviewed.  Constitutional:      General: He is not in acute distress.    Appearance: He is ill-appearing.     Comments: Intermittently falling asleep  HENT:     Head: Normocephalic and atraumatic.  Cardiovascular:     Rate and Rhythm: Tachycardia present. Rhythm irregular.  Pulmonary:     Effort: Pulmonary effort is normal. No respiratory distress.  Abdominal:     General: Abdomen is flat.     Palpations: Abdomen is soft.  Skin:    General: Skin is warm and dry.  Neurological:     Mental Status: He is alert.     Comments: Able to shake head yes/no, occasionally tries to mouth/speak yes/no. No other meaningful conversation    Palliative Assessment/Data: 20-30%   Assessment & Plan:   Impression: Present on Admission:  Atrial fibrillation ([place])  Essential hypertension  Hx of Embolic stroke (HCC)  Chronic venous stasis dermatitis  Acute on chronic diastolic CHF (congestive heart failure) ([place])  87 year old male with multiple ongoing acute and chronic issues.  Admitted with hypoxic respiratory failure and shortness of breath likely multifactorial including acute on chronic CHF and aspiration  pneumonia.  Speech therapy has found him to have trace aspiration.  He has been requiring intermittent BiPAP.  He also had a CVA on 12 4 with residual left-sided weakness.  Permanent A. fib with cardiology reevaluation today unlikely to convert rhythm, can make attempts at rate control though limited options due to pulmonary and kidney diseases.  Significant decline overall.  60 of family seems to be on board with comfort care and hospice but son Juanda Crumble wanted further information from cardiology.  I have attempted to call him to relay this information and left a voicemail.  We will try to all meet for further discussions/goals of care tomorrow  SUMMARY OF RECOMMENDATIONS   Continue current care for now Remain DNR Will meet with family again tomorrow for another [place] discussion now that cardiology has weighed in PMT will continue to support  Code Status: DNR  Prognosis: Unable to determine  Discharge Planning: To Be Determined  Discussed with: Medical team, nursing team, patient, patient's family  Thank you for allowing [place] to participate in the care of Jerred  Erasmo Leventhal PMT will continue to support holistically.  Time Total: 65 min (45 min initial + 20 min addendum)  Visit consisted of counseling and education dealing with the complex and emotionally intense issues of symptom management and palliative care in the setting of serious and potentially life-threatening illness. Greater than 50%  of this time was spent counseling and coordinating care related to the above assessment and plan.  Walden Field, NP Palliative Medicine Team  Team Phone # [phone] (Nights/Weekends)  09/08/2020, 8:17 AM

## 2020-12-17 NOTE — Progress Notes (Signed)
PROGRESS NOTE    [name]  ZTI:[id number] DOB: 02/11/1930 DOA: 12/13/2020 PCP: Gaynelle Arabian, MD   Brief Narrative:  87 years old male with past medical history of CHF, persistent atrial fibrillation, hypertension, history of CVA, thrombocytopenia, came with a chief complaint of shortness of breath.  The symptoms started 1 week ago. His saturation dropped at home in the 70s. In the emergency room he was found to be in congestive heart failure, with a BNP elevated 231 troponin elevated at 41 trended down to 38 on repeat Respiratory panel negative chest x-ray showed edema Patient received Lasix IV 80 mg, DuoNebs, metoprolol, INR subtherapeutic 1.6. Patient used to take Lasix 40 mg daily BUN and creatinine were normal  Assessment & Plan:   Active Problems:   Atrial fibrillation (HCC)   Hx of Embolic stroke (HCC)   Essential hypertension   Chronic venous stasis dermatitis   Hypokalemia   Acute on chronic diastolic CHF (congestive heart failure) (HCC)  Acute hypoxic respiratory failure secondary to acute on chronic congestive heart failure with preserved ejection fraction: Still requiring as needed BiPAP and currently 5 L of oxygen.  Repeat chest x-ray 12/14/2020 does not show much improvement, he also developed fever of 100.6 last night.  Repeat procalcitonin significantly elevated over 2, raising suspicion for aspiration pneumonia, for which Zosyn was a started on 12/16/2020.  Patient has improved somewhat compared to yesterday.  Bilateral upper lungs obesity: COVID, influenza negative.  At that time, procalcitonin unremarkable.  Lactic acid normal.  No leukocytosis.  He was afebrile.  I had doubts that he had any pneumonia.  Patient has worsened in last 24 hours with elevated procalcitonin and low-grade fever of 100.6, raising suspicion for possible aspiration pneumonia, Zosyn started on 12/16/2020.  Hypokalemia: Resolved.  Acute renal failure: Sudden and significant rise in in his  creatinine to 2.35 secondary to his acute illness as well as A. fib with RVR, creatinine stable at that level again today.  Continue to hold Lasix and avoid nephrotoxic agents.  Permanent/persistent atrial fibrillation: Started on amiodarone drip yesterday, heart rate now around 110-120 on amnio drip.  Per family's request, cardiology consulted this morning as family wants to know cardiology's opinion before they can consider for full comfort care versus aggressive care.  Acute encephalopathy: Much better today, awake and fully oriented although very slow in response.  Son at the bedside.  Acute ischemic stroke: On 12/13/2020, patient was noted to have left-sided weakness.  He was last known normal at around 9:30 AM the same day.  Code stroke was called, neurology was consulted, patient underwent stroke work-up.  Initial CT head was negative however due to impressive stroke findings on clinical exam, patient received TNK, subsequently MRI showed small to moderate size right frontotemporal infarct.  Repeat head CT today does not show any difference.  Patient is still with significant left upper extremity weakness and some weakness in the left lower extremity.  On Eliquis now.  On statin.  Neurology signed off.  PT OT and SLP on board.  Moderate oropharyngeal dysphagia: Had issues with swallowing at multiple times.  Now s/p MBS 12/15/2020, diet downgraded to dysphagia 1 diet.  Hyperlipidemia: Continue atorvastatin.  GOC: Family meeting with palliative care on the evening of 12/16/2020.  Per reports, most family members were agreeable for comfort care and hospice but 1 son who wanted cardiology consultation.  Appreciate palliative care following this patient closely.  DVT prophylaxis: Eliquis   Code Status: DNR  Family  Communication: Discussed with son at the bedside  Status is: Inpatient  Remains inpatient appropriate because: Needs further inpatient management of a stroke and CHF.  Estimated body  mass index is 25.33 kg/m as calculated from the following:   Height as of this encounter: 6\' 1"  (1.854 m).   Weight as of this encounter: 87.1 kg.  Nutritional Assessment: Body mass index is 25.33 kg/m.Marland Kitchen Seen by dietician.  I agree with the assessment and plan as outlined below: Nutrition Status:   Skin Assessment: I have examined the patient's skin and I agree with the wound assessment as performed by the wound care RN as outlined below:    Consultants:  Neurology ] Palliative care Cardiology  Procedures:  None  Antimicrobials:  Anti-infectives (From admission, onward)    Start     Dose/Rate Route Frequency Ordered Stop   12/16/20 1600  piperacillin-tazobactam (ZOSYN) IVPB 2.25 g        2.25 g 100 mL/hr over 30 Minutes Intravenous Every 8 hours 12/16/20 1512     12/13/20 2215  ceFEPIme (MAXIPIME) 2 g in sodium chloride 0.9 % 100 mL IVPB  Status:  Discontinued        2 g 200 mL/hr over 30 Minutes Intravenous Every 12 hours 12/13/20 2121 12/14/20 1155   12/13/20 1930  doxycycline (VIBRAMYCIN) 100 mg in sodium chloride 0.9 % 250 mL IVPB  Status:  Discontinued        100 mg 125 mL/hr over 120 Minutes Intravenous Every 12 hours 12/13/20 1835 12/16/20 1457   12/13/20 1230  ceFEPIme (MAXIPIME) 1 g in sodium chloride 0.9 % 100 mL IVPB  Status:  Discontinued        1 g 200 mL/hr over 30 Minutes Intravenous Every 12 hours 12/13/20 1131 12/13/20 2121          Subjective:  Seen and examined.  On nasal cannula.  Awake and oriented today.  Denies any complaint of shortness of breath.  Objective: Vitals:   12/17/20 0305 12/17/20 0835 12/17/20 0840 12/17/20 0841  BP: 109/73 117/65    Pulse: (!) 102     Resp: 20 (!) 29    Temp: 97.7 F (36.5 C) 99.4 F (37.4 C)    TempSrc: Oral Oral    SpO2: 95% 94% 95% 95%  Weight:      Height:        Intake/Output Summary (Last 24 hours) at 12/17/2020 1048 Last data filed at 12/17/2020 0305 Gross per 24 hour  Intake 1133.31 ml   Output 1125 ml  Net 8.31 ml    Filed Weights   12/12/20 0545 12/13/20 0344 12/14/20 0900  Weight: 92.5 kg 90.2 kg 87.1 kg    Examination:  General exam: Appears calm and comfortable  Respiratory system: Diminished breath sounds with poor inspiratory effort Cardiovascular system: S1 & S2 heard, irregularly irregular rate and rhythm. No JVD, murmurs, rubs, gallops or clicks. No pedal edema. Gastrointestinal system: Abdomen is slightly distended, soft and nontender. No organomegaly or masses felt. Normal bowel sounds heard. Central nervous system: Alert and oriented.  1/5 power in left upper extremity and 4/5 power in left lower extremity. Extremities: Symmetric 5 x 5 power.   Data Reviewed: I have personally reviewed following labs and imaging studies  CBC: Recent Labs  Lab 01/07/2021 1400 12/12/20 0053 12/13/20 0200 12/13/20 1049 12/13/20 1250 12/14/20 0340 12/16/20 0919 12/17/20 0831  WBC 7.9   < > 11.1*  --  12.3* 9.8 26.3* 20.6*  NEUTROABS 6.0  --   --   --   --  8.0* 23.3* 18.4*  HGB 13.9   < > 13.7  --  13.8 13.4 13.9 13.1  HCT 43.4   < > 43.5  --  42.9 42.5 43.8 41.7  MCV 87.7   < > 89.5  --  88.5 89.5 89.8 90.1  PLT PLATELET CLUMPS NOTED ON SMEAR, UNABLE TO ESTIMATE   < > PLATELET CLUMPS NOTED ON SMEAR, UNABLE TO ESTIMATE PLATELET CLUMPS NOTED ON SMEAR, UNABLE TO ESTIMATE PLATELET CLUMPS NOTED ON SMEAR, UNABLE TO ESTIMATE PLATELET CLUMPS NOTED ON SMEAR, UNABLE TO ESTIMATE 215 PLATELET CLUMPS NOTED ON SMEAR, UNABLE TO ESTIMATE   < > = values in this interval not displayed.    Basic Metabolic Panel: Recent Labs  Lab 12/30/2020 1550 12/15/2020 2123 12/12/20 1423 12/13/20 1250 12/14/20 0340 12/15/20 0207 12/16/20 0919 12/17/20 0831  NA  --    < > 137 132* 134* 129* 135 136  K  --    < > 3.8 3.5 3.4* 4.1 4.1 3.9  CL  --    < > 95* 91* 92* 89* 94* 95*  CO2  --    < > 33* 32 32 27 28 28   GLUCOSE  --    < > 87 138* 93 188* 157* 134*  BUN  --    < > 12 19 22  24* 55*  79*  CREATININE  --    < > 1.17 1.19 1.25* 1.22 2.35* 2.33*  CALCIUM  --    < > 9.0 9.0 8.9 8.9 9.0 9.0  MG 1.9  --  2.0 2.0  --   --  2.3 2.5*   < > = values in this interval not displayed.    GFR: Estimated Creatinine Clearance: 24.8 mL/min (A) (by C-G formula based on SCr of 2.33 mg/dL (H)). Liver Function Tests: Recent Labs  Lab 12/14/2020 1400 12/12/20 0053 12/13/20 1250 12/16/20 0919  AST 23 27 31 22   ALT 16 19 17 15   ALKPHOS 55 58 58 60  BILITOT 0.8 0.9 1.8* 0.8  PROT 8.6* 7.9 7.8 8.1  ALBUMIN 4.0 3.2* 3.1* 2.7*    No results for input(s): LIPASE, AMYLASE in the last 168 hours. No results for input(s): AMMONIA in the last 168 hours. Coagulation Profile: Recent Labs  Lab 12/14/2020 1400 12/12/20 0053 12/13/20 0200 12/14/20 0340 12/15/20 0207  INR 1.6* 1.6* 1.6* 1.9* 2.0*    Cardiac Enzymes: No results for input(s): CKTOTAL, CKMB, CKMBINDEX, TROPONINI in the last 168 hours. BNP (last 3 results) No results for input(s): PROBNP in the last 8760 hours. HbA1C: Recent Labs    12/15/20 0207  HGBA1C 6.4*    CBG: Recent Labs  Lab 12/13/20 1044 12/14/20 0728  GLUCAP 166* 80    Lipid Profile: Recent Labs    12/15/20 0207  CHOL 114  HDL 46  LDLCALC 55  TRIG 65  CHOLHDL 2.5    Thyroid Function Tests: No results for input(s): TSH, T4TOTAL, FREET4, T3FREE, THYROIDAB in the last 72 hours. Anemia Panel: No results for input(s): VITAMINB12, FOLATE, FERRITIN, TIBC, IRON, RETICCTPCT in the last 72 hours. Sepsis Labs: Recent Labs  Lab 12/28/2020 1400 12/15/2020 1550 12/13/20 1250 12/13/20 1506 12/16/20 0919  PROCALCITON  --   --  0.11  --  2.06  LATICACIDVEN 1.3 1.4 1.6 1.3  --      Recent Results (from the past 240 hour(s))  Resp Panel by RT-PCR (Flu A&B, Covid) Nasopharyngeal Swab     Status: None   Collection Time: 12/17/2020  2:00 PM   Specimen: Nasopharyngeal Swab; Nasopharyngeal(NP) swabs in vial transport medium  Result Value Ref Range Status    SARS Coronavirus 2 by RT PCR NEGATIVE NEGATIVE Final    Comment: (NOTE) SARS-CoV-2 target nucleic acids are NOT DETECTED.  The SARS-CoV-2 RNA is generally detectable in upper respiratory specimens during the acute phase of infection. The lowest concentration of SARS-CoV-2 viral copies this assay can detect is 138 copies/mL. A negative result does not preclude SARS-Cov-2 infection and should not be used as the sole basis for treatment or other patient management decisions. A negative result may occur with  improper specimen collection/handling, submission of specimen other than nasopharyngeal swab, presence of viral mutation(s) within the areas targeted by this assay, and inadequate number of viral copies(<138 copies/mL). A negative result must be combined with clinical observations, patient history, and epidemiological information. The expected result is Negative.  Fact Sheet for Patients:  EntrepreneurPulse.com.au  Fact Sheet for Healthcare Providers:  IncredibleEmployment.be  This test is no t yet approved or cleared by the [place] FDA and  has been authorized for detection and/or diagnosis of SARS-CoV-2 by FDA under an Emergency Use Authorization (EUA). This EUA will remain  in effect (meaning this test can be used) for the duration of the COVID-19 declaration under Section 564(b)(1) of the Act, 21 U.S.C.section 360bbb-3(b)(1), unless the authorization is terminated  or revoked sooner.       Influenza A by PCR NEGATIVE NEGATIVE Final   Influenza B by PCR NEGATIVE NEGATIVE Final    Comment: (NOTE) The Xpert Xpress SARS-CoV-2/FLU/RSV plus assay is intended as an aid in the diagnosis of influenza from Nasopharyngeal swab specimens and should not be used as a sole basis for treatment. Nasal washings and aspirates are unacceptable for Xpert Xpress SARS-CoV-2/FLU/RSV testing.  Fact Sheet for  Patients: EntrepreneurPulse.com.au  Fact Sheet for Healthcare Providers: IncredibleEmployment.be  This test is not yet approved or cleared by the [place] FDA and has been authorized for detection and/or diagnosis of SARS-CoV-2 by FDA under an Emergency Use Authorization (EUA). This EUA will remain in effect (meaning this test can be used) for the duration of the COVID-19 declaration under Section 564(b)(1) of the Act, 21 U.S.C. section 360bbb-3(b)(1), unless the authorization is terminated or revoked.  Performed at [facility], [address], [place], [place] [address]   MRSA Next Gen by PCR, Nasal     Status: None   Collection Time: 12/13/20  3:01 PM   Specimen: Nasal Mucosa; Nasal Swab  Result Value Ref Range Status   MRSA by PCR Next Gen NOT DETECTED NOT DETECTED Final    Comment: (NOTE) The GeneXpert MRSA Assay (FDA approved for NASAL specimens only), is one component of a comprehensive MRSA colonization surveillance program. It is not intended to diagnose MRSA infection nor to guide or monitor treatment for MRSA infections. Test performance is not FDA approved in patients less than 40 years old. Performed at [facility], [place] [address], [place], [place] [address]        Radiology Studies: No results found.  Scheduled Meds:  amiodarone  200 mg Oral Q12H   Followed by   Derrill Memo ON 12/24/2020] amiodarone  200 mg Oral Daily   apixaban  2.5 mg Oral BID   atorvastatin  20 mg Oral Daily   metoprolol succinate  100 mg Oral Daily   sodium chloride flush  3 mL Intravenous Q12H   Continuous Infusions:  sodium chloride Stopped (12/14/20 1151)  amiodarone 30 mg/hr (12/17/20 0025)   piperacillin-tazobactam (ZOSYN)  IV 2.25 g (12/17/20 0525)     LOS: 6 days   Time spent: 30 minutes   Darliss Cheney, MD [facility]  12/17/2020, 10:48 AM  Please page via [place] [place] and do not message via secure chat  for anything urgent. Secure chat can be used for anything non urgent.  How to contact the [facility] Attending or Consulting provider [place] or covering provider during after hours [place], for this patient?  Check the care team in [facility] and look for a) attending/consulting [facility] provider listed and b) the [facility] team listed. Page or secure chat [phone]. Log into [email or web address] and use [place]'s universal password to access. If you do not have the password, please contact the hospital operator. Locate the [facility] provider you are looking for under [facility] and page to a number that you can be directly reached. If you still have difficulty reaching the provider, please page the [facility] (Director on Call) for the Hospitalists listed on amion for assistance.

## 2020-12-17 NOTE — Plan of Care (Signed)
  Problem: Elimination: Goal: Will not experience complications related to bowel motility Outcome: Progressing Goal: Will not experience complications related to urinary retention Outcome: Progressing   Problem: Pain Managment: Goal: General experience of comfort will improve Outcome: Progressing   Problem: Safety: Goal: Ability to remain free from injury will improve Outcome: Progressing   Problem: Cardiac: Goal: Ability to achieve and maintain adequate cardiopulmonary perfusion will improve Outcome: Progressing

## 2020-12-17 NOTE — Progress Notes (Signed)
Progress Note  Patient Name: [name] Date of Encounter: 12/17/2020  Primary Cardiologist: Sinclair Grooms, MD   Subjective   Patient was seen and examined at his bedside. He awake in bed when I arrived.  His son and nurse were at the bedside.  His son had many questions about the atrial fibrillation as well as his recent stroke.  Which I was able to answer the questions during the encounter.  Most of his questions were geared towards recovery from his current state as well as wanting to have information to help him make decisions about goals of care.  Inpatient Medications    Scheduled Meds:  amiodarone  200 mg Oral Q12H   Followed by   Derrill Memo ON 12/24/2020] amiodarone  200 mg Oral Daily   apixaban  2.5 mg Oral BID   atorvastatin  20 mg Oral Daily   metoprolol succinate  100 mg Oral Daily   sodium chloride flush  3 mL Intravenous Q12H   Continuous Infusions:  sodium chloride Stopped (12/14/20 1151)   amiodarone 30 mg/hr (12/17/20 1124)   piperacillin-tazobactam (ZOSYN)  IV 2.25 g (12/17/20 0525)   PRN Meds: sodium chloride, acetaminophen **OR** acetaminophen, albuterol, ondansetron (ZOFRAN) IV, polyethylene glycol   Vital Signs    Vitals:   12/17/20 0305 12/17/20 0835 12/17/20 0840 12/17/20 0841  BP: 109/73 117/65    Pulse: (!) 102     Resp: 20 (!) 29    Temp: 97.7 F (36.5 C) 99.4 F (37.4 C)    TempSrc: Oral Oral    SpO2: 95% 94% 95% 95%  Weight:      Height:        Intake/Output Summary (Last 24 hours) at 12/17/2020 1224 Last data filed at 12/17/2020 0305 Gross per 24 hour  Intake 1133.31 ml  Output 1125 ml  Net 8.31 ml   Filed Weights   12/12/20 0545 12/13/20 0344 12/14/20 0900  Weight: 92.5 kg 90.2 kg 87.1 kg    Telemetry    Atrial fibrillation- Personally Reviewed  ECG    None today- Personally Reviewed  Physical Exam   GEN: No acute distress.   Neck: No JVD Cardiac: RRR, no murmurs, rubs, or gallops.  Respiratory: Clear to  auscultation bilaterally. GI: Soft, nontender, non-distended  MS: No edema; No deformity. Neuro:  Nonfocal  Psych: Normal affect   Labs    Chemistry Recent Labs  Lab 12/12/20 0053 12/12/20 1423 12/13/20 1250 12/14/20 0340 12/15/20 0207 12/16/20 0919 12/17/20 0831  NA 136   < > 132*   < > 129* 135 136  K 3.5   < > 3.5   < > 4.1 4.1 3.9  CL 93*   < > 91*   < > 89* 94* 95*  CO2 36*   < > 32   < > 27 28 28   GLUCOSE 113*   < > 138*   < > 188* 157* 134*  BUN 12   < > 19   < > 24* 55* 79*  CREATININE 1.03   < > 1.19   < > 1.22 2.35* 2.33*  CALCIUM 8.9   < > 9.0   < > 8.9 9.0 9.0  PROT 7.9  --  7.8  --   --  8.1  --   ALBUMIN 3.2*  --  3.1*  --   --  2.7*  --   AST 27  --  31  --   --  22  --  ALT 19  --  17  --   --  15  --   ALKPHOS 58  --  58  --   --  60  --   BILITOT 0.9  --  1.8*  --   --  0.8  --   GFRNONAA >60   < > 59*   < > 57* 26* 26*  ANIONGAP 7   < > 9   < > 13 13 13    < > = values in this interval not displayed.     Hematology Recent Labs  Lab 12/14/20 0340 12/16/20 0919 12/17/20 0831  WBC 9.8 26.3* 20.6*  RBC 4.75 4.88 4.63  HGB 13.4 13.9 13.1  HCT 42.5 43.8 41.7  MCV 89.5 89.8 90.1  MCH 28.2 28.5 28.3  MCHC 31.5 31.7 31.4  RDW 14.6 15.0 15.1  PLT PLATELET CLUMPS NOTED ON SMEAR, UNABLE TO ESTIMATE 215 PLATELET CLUMPS NOTED ON SMEAR, UNABLE TO ESTIMATE    Cardiac EnzymesNo results for input(s): TROPONINI in the last 168 hours. No results for input(s): TROPIPOC in the last 168 hours.   BNP Recent Labs  Lab 12/14/2020 1400 12/14/20 0348  BNP 231.3* 93.9     DDimer No results for input(s): DDIMER in the last 168 hours.   Radiology    No results found.  Cardiac Studies   TTE 10/15/2020 1. Left ventricular ejection fraction, by estimation, is 60 to 65%. The  left ventricle has normal function. The left ventricle has no regional  wall motion abnormalities. There is mild left ventricular hypertrophy.  Left ventricular diastolic function  could  not be evaluated.   2. Right ventricular systolic function is normal. The right ventricular  size is normal. There is mildly elevated pulmonary artery systolic  pressure.   3. Left atrial size was severely dilated.   4. The mitral valve is grossly normal. Mild mitral valve regurgitation.   5. The aortic valve is tricuspid. There is mild calcification of the  aortic valve. There is mild thickening of the aortic valve. Aortic valve  regurgitation is not visualized.    Patient Profile     87 y.o. male presents with atrial fibrillation on anticoagulation with warfarin, recent CVA, hypertension, hyperlipidemia, BPH, memory loss.  Assessment & Plan    Atrial fibrillation History of embolic stroke Essential hypertension Chronic venous stasis with dermatitis Acute on chronic diastolic heart failure  He has permanent atrial fibrillation and is anticoagulation with apixaban which was started yesterday at 2.5 mg daily.  He had previously been on warfarin.  There has been some difficulty controlling the patient's heart rate he has been on beta-blocker.  And has recently been started on amiodarone drip.  I do not think that  amiodarone is the right choice of medication in this patient since we are not looking for rhythm control due to his permanent atrial fibrillation and giving the fact that he does have interstitial lung disease which can be worsened by the amiodarone.  I would opt for another rate control agent.  In the setting that his blood pressure is marginal we can keep him on his metoprolol in consider using renal dosing of digoxin (digoxin 125 mg every 72 hours).  In terms of his recent stroke and residual weaknesses in the upper left and lower extremities and significant co-mobilities they have been discussion on palliative care.  The patient's son did have multiple questions about recovery.  I did explain to the patient and his son that giving  the fact that he has significant core mobilities  and has not had full recovery of his physical function palliative care is not unreasonable here.  Part of the decision-making is understanding the patient wishes as well as the burden of recurrent faction as well as probably or possibly recurrent stroke in the setting of his atrial fibrillation could really diminish his quality of life.  He thanked me for the discussion and my opinion on the current clinical situation.  I also expressed to the patient that in terms of his atrial fibrillation rate control is the best we can do as he is permanent A. fib and has will not benefit from rhythm control strategy.  It appears that his respiratory failure in terms of the respiratory failure he still is requiring the BiPAP with nasal cannula at 5 L.  This is being managed by the primary team.  Clinically euvolemic.  Aspiration pneumonia-Manish with antibiotics per primary team.     For questions or updates, please contact [place] Please consult [email or web address] for contact info under Cardiology/STEMI.      Rolly Pancake, DO  12/17/2020, 12:24 PM

## 2020-12-17 NOTE — Progress Notes (Signed)
Speech Language Pathology Treatment:    Patient Details Name: [name] MRN: [id number] DOB: 05-Jan-1931 Today's Date: 12/17/2020 Time: 6962-9528 SLP Time Calculation (min) (ACUTE ONLY): 15 min  Assessment / Plan / Recommendation Clinical Impression  Pt seen for assessment of diet tolerance and education at bedside today. Pt's son was present during this session. Pt appeared drowsy today, but was receptive to a few small bites/sips of D1/NTL lunch items. Pt required ongoing encouragement to accept bites/sips, and almost consistently met the spoon with his tongue, preventing adequate bolus presentation. Pt was observed to hold puree and nectar thick liquids orally prior to the swallow, and exhibited frequent loud belching. No overt s/s aspiration observed following either texture.  Used oral swab was noted at bedside, soaking in water. Son was encouraged to use a swab once and throw it away, as allowing used swabs to soak in room temperature water provides the perfect environment for bacteria growth. If pt then aspirates any of the water on the swab, he is also aspirating all the bacteria, increasing risk of infection. Additionally, pt's son indicated he has been providing nectar thick liquid via syringe. Son was encouraged to provide small CUP sips (which was demonstrated for him by SLP), allowing pt the tactile input from the cup/liquid. Use of a syringe bypasses this input, and increases risk for mismanagement of oral contents, increasing aspiration risk.  Pt reported chest pain. RN was informed. RN was also notified of SLP's recommendations to son regarding oral swabs and syringes. Safe swallow precautions posted at [facility]. SLP will continue to follow to assess tolerance, continue education, and determine appropriate plan of care.   HPI HPI: Pt is an 87 y.o. male who presented initially  with ongoing shortness of breath. Dx acute enceophalopathy. CXR 12/3: Increased opacity within the right upper  lung, suspicious for pneumonia, alternatively asymmetric edema or atelectasis. Pt was evaluated by SLP on 9/4 at 9 am, foudn to be toelrating PO with a possible mild baseline dysphagia. Later that morning a code stroke was called; patient was noted to have left-sided weakness.  He was last known normal at around 9:30 AM the same day.   Initial CT head was negative however due to impressive stroke findings on clinical exam, patient received TNK, subsequently MRI showed small to moderate size right frontotemporal infarct.  PMH: CHF, atrial fibrillation, venous stasis, hypertension, history of CVA, thrombocytopenia.      SLP Plan  Continue with current plan of care      Recommendations for follow up therapy are one component of a multi-disciplinary discharge planning process, led by the attending physician.  Recommendations may be updated based on patient status, additional functional criteria and insurance authorization.    Recommendations  Diet recommendations: Dysphagia 1 (puree);Nectar-thick liquid Liquids provided via: Cup;No straw Medication Administration: Crushed with puree Supervision: Full supervision/cueing for compensatory strategies;Trained caregiver to feed patient Compensations: Slow rate;Small sips/bites;Clear throat intermittently Postural Changes and/or Swallow Maneuvers: Seated upright 90 degrees;Upright 30-60 min after meal                Oral Care Recommendations: Oral care BID Follow Up Recommendations: Skilled nursing-short term rehab (<3 hours/day) Assistance recommended at discharge: Frequent or constant Supervision/Assistance SLP Visit Diagnosis: Dysphagia, unspecified (R13.10) Plan: Continue with current plan of care       Bowie B. Quentin Ore, [place], [place] Speech Language Pathologist Office: [phone]  Colon Flattery  Brown  12/17/2020, 12:48 PM

## 2020-12-17 NOTE — Progress Notes (Signed)
Physical Therapy Treatment Patient Details Name: [name] MRN: [id number] DOB: 03/05/1930 Today's Date: 12/17/2020   History of Present Illness 87 yo male presenting after a fall at home resulting in pain in tailbone and hematuria. Upon work up pt found to have acute resp failure with hypoxia due to CHF exacerbation and AMS. MRI 12/4 showed small to moderate size right frontotemporal infarct. PMH includes: afib on anticoagulation, embolic CVA, HTN, and venous stasis ulcers.    PT Comments    Pt received in supine, agreeable to therapy session and son Louie Casa pleasant and receptive to instruction on repositioning for prevention of skin breakdown and pressure relief, safe body mechanics when assisting pt to reposition, benefits of ROM as tolerated and benefits of mobility as tolerated. Pt continues to require +2 totalA for rolling and long sit transfer in bed and needs totalA to maintain upright posture while seated, he was unable to assist using UE/LE for balance today. Pt continues to benefit from PT services to progress toward functional mobility goals. Will continue to follow acutely, per chart review pt family is deciding between comfort care or post-acute rehab.    Recommendations for follow up therapy are one component of a multi-disciplinary discharge planning process, led by the attending physician.  Recommendations may be updated based on patient status, additional functional criteria and insurance authorization.  Follow Up Recommendations  Skilled nursing-short term rehab (<3 hours/day)     Assistance Recommended at Discharge Frequent or constant Supervision/Assistance  Equipment Recommendations  Other (comment) (If home instead, may consider wheelchair that reclines with gel cushion, hospital bed, mechanical lift with pads if not already owned)    Recommendations for Other Services       Precautions / Restrictions Precautions Precautions: Fall Precaution Comments: L  hemiparesis UE>LE Restrictions Weight Bearing Restrictions: No     Mobility  Bed Mobility Overal bed mobility: Needs Assistance Bed Mobility: Rolling Rolling: Total assist;+2 for physical assistance         General bed mobility comments: from elevated bed height to long sit with +2 totalA, pt tolerated long sit for ~30 seconds but increased WOB and notable fatigue so returned to supine. Pt totalA +2 for posterior supine scooting, he was able to bed knees but unable to assist with BLE to scoot. multimodal cues for rolling to L/R sides for hygiene assist but pt unable to assist with rolling, total A+2    Transfers                   General transfer comment: unable given fatigue with long sit and rolling    Ambulation/Gait                   Stairs             Wheelchair Mobility    Modified Rankin (Stroke Patients Only) Modified Rankin (Stroke Patients Only) Pre-Morbid Rankin Score: Moderately severe disability Modified Rankin: Severe disability     Balance Overall balance assessment: Needs assistance Sitting-balance support: Single extremity supported;Feet supported Sitting balance-Leahy Scale: Zero Sitting balance - Comments: totalA +2 for long sit in bed, pt unable to utilize either UE for support and would fall whichever way he leaned Postural control: Posterior lean;Left lateral lean     Standing balance comment: unable to safely attempt                            Cognition Arousal/Alertness:  (eyes  open throughout but appears fatigued/drowsy) Behavior During Therapy: Flat affect Overall Cognitive Status: Impaired/Different from baseline Area of Impairment: Orientation       General Comments: Pt minimally verbal and when he does form words, breathless/soft voice and difficult to understand other than yes/no, he is able to appropriately state yes/no for comfortable positioning in bed but poor initiation and very limited  participation/slow processing although eyes open throughout        Exercises Other Exercises Other Exercises: supine RLE A/AAROM/LLE AAROM: ankle pumps, heel slides, hip abduction, SAQ x10 reps Other Exercises: supine LUE PROM: wrist flex/ext, elbow flexion/ext, shoulder flexion in pain-free range x5-10 reps ea    General Comments General comments (skin integrity, edema, etc.): VSS on O2 [place] ~93-95% with repositioning; HR variable 100-120's bpm due to afib      Pertinent Vitals/Pain Pain Assessment: No/denies pain Pain Location: no grimacing with positional change or hygiene assist Pain Intervention(s): Monitored during session;Repositioned;Limited activity within patient's tolerance    Home Living  PLOF : able to get around with RW on his own in his house and toilet himself (gets A from son for bathing/dressing).     Per chart review he owns RW and 3in1.       PT Goals (current goals can now be found in the care plan section) Acute Rehab PT Goals Patient Stated Goal: per family deciding on comfort care vs rehab for post-acute PT Goal Formulation: With patient Time For Goal Achievement: 12/29/20 Progress towards PT goals: Not progressing toward goals - comment (limited participation)    Frequency    Min 3X/week      PT Plan Current plan remains appropriate       AM-PAC PT "6 Clicks" Mobility   Outcome Measure  Help needed turning from your back to your side while in a flat bed without using bedrails?: Total Help needed moving from lying on your back to sitting on the side of a flat bed without using bedrails?: Total Help needed moving to and from a bed to a chair (including a wheelchair)?: Total Help needed standing up from a chair using your arms (e.g., wheelchair or bedside chair)?: Total Help needed to walk in hospital room?: Total Help needed climbing 3-5 steps with a railing? : Total 6 Click Score: 6    End of Session Equipment Utilized During Treatment:  Oxygen Activity Tolerance: Patient limited by fatigue Patient left: in bed;with call bell/phone within reach;with bed alarm set;with SCD's reapplied;with family/visitor present;Other (comment) (semi-sidelying toward R, LUE elevated on pillow, heels floated) Nurse Communication: Mobility status;Other (comment) (pt needs a bath, did limited hygiene assist during PT session but likely needs gown/sheets changed) PT Visit Diagnosis: Other abnormalities of gait and mobility (R26.89);Hemiplegia and hemiparesis Hemiplegia - Right/Left: Left Hemiplegia - dominant/non-dominant: Non-dominant Hemiplegia - caused by: Cerebral infarction     Time: 0762-2633 PT Time Calculation (min) (ACUTE ONLY): 26 min  Charges:  $Therapeutic Exercise: 8-22 mins $Therapeutic Activity: 8-22 mins                     Ryah Cribb P., PTA Acute Rehabilitation Services Pager: [phone] Office: [place] 12/17/2020, 5:06 PM

## 2020-12-18 ENCOUNTER — Inpatient Hospital Stay (HOSPITAL_COMMUNITY): Payer: Medicare Other

## 2020-12-18 LAB — CBC WITH DIFFERENTIAL/PLATELET
Abs Immature Granulocytes: 0.12 10*3/uL — ABNORMAL HIGH (ref 0.00–0.07)
Basophils Absolute: 0 10*3/uL (ref 0.0–0.1)
Basophils Relative: 0 %
Eosinophils Absolute: 0 10*3/uL (ref 0.0–0.5)
Eosinophils Relative: 0 %
HCT: 42.7 % (ref 39.0–52.0)
Hemoglobin: 13.5 g/dL (ref 13.0–17.0)
Immature Granulocytes: 1 %
Lymphocytes Relative: 3 %
Lymphs Abs: 0.5 10*3/uL — ABNORMAL LOW (ref 0.7–4.0)
MCH: 28.6 pg (ref 26.0–34.0)
MCHC: 31.6 g/dL (ref 30.0–36.0)
MCV: 90.5 fL (ref 80.0–100.0)
Monocytes Absolute: 1.6 10*3/uL — ABNORMAL HIGH (ref 0.1–1.0)
Monocytes Relative: 9 %
Neutro Abs: 14.9 10*3/uL — ABNORMAL HIGH (ref 1.7–7.7)
Neutrophils Relative %: 87 %
Platelets: UNDETERMINED 10*3/uL (ref 150–400)
RBC: 4.72 MIL/uL (ref 4.22–5.81)
RDW: 15.1 % (ref 11.5–15.5)
WBC: 17 10*3/uL — ABNORMAL HIGH (ref 4.0–10.5)
nRBC: 0 % (ref 0.0–0.2)

## 2020-12-18 LAB — BASIC METABOLIC PANEL
Anion gap: 13 (ref 5–15)
BUN: 93 mg/dL — ABNORMAL HIGH (ref 8–23)
CO2: 31 mmol/L (ref 22–32)
Calcium: 8.6 mg/dL — ABNORMAL LOW (ref 8.9–10.3)
Chloride: 94 mmol/L — ABNORMAL LOW (ref 98–111)
Creatinine, Ser: 2.4 mg/dL — ABNORMAL HIGH (ref 0.61–1.24)
GFR, Estimated: 25 mL/min — ABNORMAL LOW (ref >60)
Glucose, Bld: 130 mg/dL — ABNORMAL HIGH (ref 70–99)
Potassium: 3.7 mmol/L (ref 3.5–5.1)
Sodium: 138 mmol/L (ref 135–145)

## 2020-12-18 LAB — PROCALCITONIN: Procalcitonin: 1.43 ng/mL

## 2020-12-18 MED ORDER — HALOPERIDOL 0.5 MG PO TABS: 0.5000 mg | ORAL_TABLET | ORAL | Status: DC | PRN

## 2020-12-18 MED ORDER — GLYCOPYRROLATE 1 MG PO TABS
1.0000 mg | ORAL_TABLET | ORAL | Status: DC | PRN
  Filled 2020-12-18: qty 1

## 2020-12-18 MED ORDER — HALOPERIDOL LACTATE 5 MG/ML IJ SOLN: 0.5000 mg | INTRAMUSCULAR | Status: DC | PRN

## 2020-12-18 MED ORDER — LORAZEPAM 2 MG/ML PO CONC: 1.0000 mg | ORAL | Status: DC | PRN

## 2020-12-18 MED ORDER — GLYCOPYRROLATE 0.2 MG/ML IJ SOLN: 0.2000 mg | INTRAMUSCULAR | Status: DC | PRN

## 2020-12-18 MED ORDER — MORPHINE SULFATE (PF) 2 MG/ML IV SOLN
1.0000 mg | INTRAVENOUS | Status: DC | PRN
  Administered 2020-12-19 (×2): 1 mg via INTRAVENOUS
  Filled 2020-12-18 (×3): qty 1

## 2020-12-18 MED ORDER — POLYVINYL ALCOHOL 1.4 % OP SOLN
1.0000 [drp] | Freq: Four times a day (QID) | OPHTHALMIC | Status: DC | PRN
  Filled 2020-12-18: qty 15

## 2020-12-18 MED ORDER — LORAZEPAM 2 MG/ML IJ SOLN
1.0000 mg | INTRAMUSCULAR | Status: DC | PRN
  Administered 2020-12-19: 1 mg via INTRAVENOUS
  Filled 2020-12-18: qty 1

## 2020-12-18 MED ORDER — BIOTENE DRY MOUTH MT LIQD: 15.0000 mL | OROMUCOSAL | Status: DC | PRN

## 2020-12-18 MED ORDER — LORAZEPAM 1 MG PO TABS: 1.0000 mg | ORAL_TABLET | ORAL | Status: DC | PRN

## 2020-12-18 MED ORDER — HALOPERIDOL LACTATE 2 MG/ML PO CONC
0.5000 mg | ORAL | Status: DC | PRN
  Filled 2020-12-18: qty 0.3

## 2020-12-18 MED ORDER — GLYCOPYRROLATE 0.2 MG/ML IJ SOLN
0.2000 mg | INTRAMUSCULAR | Status: DC | PRN
  Administered 2020-12-19 (×2): 0.2 mg via INTRAVENOUS
  Filled 2020-12-18 (×2): qty 1

## 2020-12-18 NOTE — Progress Notes (Signed)
[facility]) Hospital Liaison Note   Received request from PMT, for hospice services at home after discharge. Chart and patient information under review by [facility] physician. Hospice eligibility has been approved.   Spoke with Daphene Jaeger, [phone], via TC to initiate education related to hospice philosophy, services, and team approach to care. Patient/family verbalized understanding of information given. Per discussion, the plan is for patient to discharge home via PTAR once cleared to DC (12-10).    DME needs discussed. Patient has the following equipment in the home (Purchased privately): Bedside Commode Patient requests the following equipment for delivery: Oxygen-6L hospital bed chux Address verified and is correct in the chart. Javad Salva, [phone] is the family member to contact to arrange time of equipment delivery.    Please send signed and completed DNR home with patient/family. Please provide prescriptions at discharge as needed to ensure ongoing symptom management.    [facility] information and contact numbers given to Above information shared with TOC.   Please call with any questions/concerns.    Thank you for the opportunity to participate in this patient's care.   Daphene Calamity, MSW [facility] Liaison  [phone]

## 2020-12-18 NOTE — Progress Notes (Signed)
Daily Progress Note   Patient Name: [name]       Date: 12/18/2020 DOB: [birth year]  Age: 87 y.o. MRN#: [id number] Attending Physician: Darliss Cheney, MD Primary Care Physician: Gaynelle Arabian, MD Admit Date: 12/10/2020 Length of Stay: 7 days  Reason for Consultation/Follow-up: Establishing goals of care  HPI/Patient Profile:  87 y.o. male  with past medical history of CHF, atrial fibrillation, venous stasis, hypertension, history of CVA, thrombocytopenia presented to [facility] ED on 01/07/2021 from home with complaints of hypoxia and shortness of breath. Patient was admitted to [facility] on 12/15/2020 with acute on chronic CHF exacerbation, acute hypoxic respiratory failure, and possible pneumonia. Cardiology was consulted - recommended diuresing as kidneys tolerated. During admission on 12/13/20 while working with PT, code stroke was initiated due to noted mild left facial droop, mild dysarthria, LUE plegia, and LLE weakness which was far from baseline. Patient was evaluated by Neurology - stat head CT was unremarkable; however, examination findings were most consistent with a right MCA territory stroke - TNK was administered.    Has since had significant decline, requiring intermittent BiPap and AFib with uncontrolled rate. Family meeting yesterday most in agreement for comfort/hospice, but son wanted cardiology to weigh in. Cardiaology saw 12/8 and feels palliative not unreasonable given multiple comorbidities, permenant AFib, unlikely to recover baseline physical function, high risk recurrent CVA with diminished QoL.   Family has asked for PMT to revisit.  Subjective:   Subjective: Chart Reviewed. Updates received. Patient Assessed. Created space and opportunity for patient  and family to explore thoughts and feelings regarding current medical situation.  Today's Discussion: I met with the patient at the bedside and his nephew [name] was there.  I brought him up to speed on everything  going on with the patient.  He states that he has been "like a father to me".  He notes he did have a little bit of breakfast and some water and milk this morning.  However, he was clear when he was done eating.  The patient does nod his head and attempt to answer basic questions.  He indicates no pain.    There was previously some discussion with the hospitalist about risk for transfer.  When seen the patient his heart rate was elevated but stable between 125 and 130, satting 98 to 99% on 5 L oxygen, respiratory rate 25-28 and stable blood pressure 117/74.  He does not appear to be actively dying/expected death in the next hours.  I feel that he would likely be okay to transfer home per patient's family wishes.  While in the patient's room, the hospice liaison entered and we called the patient's son [name].  He states he is at his mother's house.  His mother has been a bit ill lately with pneumonia and recently starting peritoneal dialysis.  He had a good long discussion with his whole family between last night and this morning and they have authorized him to make decisions for on behalf of them.  We reviewed the patient's current clinical status, unlikelihood of a good recovery/good long-term prognosis.  The hospice liaison reviewed services available.  They have elected to take the patient home with hospice.  We discussed making him comfort care while he remains in the hospital and awaiting DME delivery by hospice.  I made it clear that there is a chance that he could pass away while transferring home.  He expresses that the family is adamant about the patient coming home with hospice.  They are willing to accept this risk.  The primary driver to this is that his mom having been ill recently has not been able to see him in 3 days.  They really want to surround him at home and be with him in his final days.  I offered general and emotional support therapeutic listening, reminiscing, empathy, and other  techniques.  Answered all questions and addressed all concerns to the best my ability.    The hospice liaison explained that the plan is to try to get DME delivered today or tomorrow morning and for the patient to be discharged home on hospice tomorrow.  All present parties are in agreement to this plan.  ROS very limited due to mental status/CVA Review of Systems  Constitutional:        Denies pain in general   Objective:   Vital Signs:  BP 124/72 (BP Location: Left Arm)   Pulse (!) 115   Temp 98.4 F (36.9 C) (Oral)   Resp (!) 24   Ht _0  (1.854 m)   Wt 95.7 kg   SpO2 95%   BMI 27.84 kg/m   Physical Exam: Physical Exam  Palliative Assessment/Data: 20%   Assessment & Plan:   Impression: Present on Admission:  Atrial fibrillation ([place] [facility])  Essential hypertension  Hx of Embolic stroke (HCC)  Chronic venous stasis dermatitis  Acute on chronic diastolic CHF (congestive heart failure) ([place])  87 year old male with multiple ongoing acute and chronic issues.  Admitted with hypoxic respiratory failure and shortness of breath likely multifactorial including acute on chronic CHF and aspiration pneumonia.  Speech therapy has found him to have trace aspiration.  He has been requiring intermittent BiPAP.  He also had a CVA on 12/4 with residual left-sided weakness.  Permanent A. fib with cardiology reevaluation today unlikely to convert rhythm, can make attempts at rate control though limited options due to pulmonary and kidney diseases.  Significant decline overall.  After follow-up discussions with the patient's family, including his son [name] and cardiology's opinion, the family is opted to discharge home with home hospice.  I explained the risk of possible passing during transfer and they accept this risk and are adamant about him coming home.  Anticipate discharge home tomorrow.  SUMMARY OF RECOMMENDATIONS   Continue DNR Home with hospice at discharge Transition to comfort care  as inpatient in the interim PMT will symptom check in the morning  Code Status: DNR  Prognosis: Hours - Days  Discharge Planning: Home with Hospice  Discussed with: Medical team, nursing team, patient's family, [facility] colleague, hospice liaison  Thank you for allowing [place] to participate in the care of [name] PMT will continue to support holistically.  Time Total: 75 min  Visit consisted of counseling and education dealing with the complex and emotionally intense issues of symptom management and palliative care in the setting of serious and potentially life-threatening illness. Greater than 50%  of this time was spent counseling and coordinating care related to the above assessment and plan.  Walden Field, NP Palliative Medicine Team  Team Phone # [phone] (Nights/Weekends)  09/08/2020, 8:17 AM

## 2020-12-18 NOTE — Progress Notes (Signed)
PT Cancellation Note  Patient Details Name: TREYTEN MONESTIME MRN: [id number] DOB: July 12, 1930   Cancelled Treatment:    Reason Eval/Treat Not Completed: Other (comment); spoke with OT who reports RN states plans for comfort care.  PT will sign off.  Please order again if needs arise.    Reginia Naas 12/18/2020, 2:09 PM Magda Kiel, PT Acute Rehabilitation Services Pager:[phone] Office:[phone] 12/18/2020

## 2020-12-18 NOTE — Progress Notes (Signed)
Nutrition Brief Note  RD consulted to assess pt. Chart reviewed. Pt now transitioning to comfort care with plans to discharge home with hospice tomorrow. Discussed with MD and with PMT. No further nutrition interventions planned at this time.  Please re-consult as needed.    Theone Stanley., MS, RD, LDN (she/her/hers) RD pager number and weekend/on-call pager number located in [place].

## 2020-12-18 NOTE — TOC Progression Note (Signed)
Transition of Care [facility]) - Progression Note    Patient Details  Name: [name] MRN: [id number] Date of Birth: 03-08-1930  Transition of [facility]) CM/SW Contact  Pollie Friar, RN Phone Number: 12/18/2020, 4:24 PM  Clinical Narrative:    Patient is discharging home with hospice care through [facility] tomorrow. DME has been ordered and should be delivered to the home in the am.  Pt will need ambulance transport home.  TOC following.     Barriers to Discharge: Continued Medical Work up  Expected Discharge Plan and [place]     Representative spoke with at [place]: [place] (SDOH) Interventions    Readmission Risk Interventions No flowsheet data found.

## 2020-12-18 NOTE — Plan of Care (Signed)
  Problem: Acute Rehab PT Goals(only PT should resolve) Goal: Pt will Roll Supine to Side Outcome: Not Met (add Reason) Goal: Pt Will Go Supine/Side To Sit Outcome: Not Met (add Reason) Goal: Patient Will Transfer Sit To/From Stand Outcome: Not Met (add Reason) Goal: Pt Will Transfer Bed To Chair/Chair To Bed Outcome: Not Met (add Reason) Goal: Pt Will Ambulate Outcome: Not Met (add Reason) Goal: Pt/caregiver will Perform Home Exercise Program Outcome: Not Met (add Reason)  Pt now for comfort care.  PT will sign off. Magda Kiel, PT Acute Rehabilitation Services [phone] Office:[phone] 12/18/2020

## 2020-12-18 NOTE — Progress Notes (Signed)
PROGRESS NOTE    [name]  GGY:[id number] DOB: 1930-09-24 DOA: 12/28/2020 PCP: Gaynelle Arabian, MD   Brief Narrative:  87 years old male with past medical history of CHF, persistent atrial fibrillation, hypertension, history of CVA, thrombocytopenia, came with a chief complaint of shortness of breath.  The symptoms started 1 week ago. His saturation dropped at home in the 70s. In the emergency room he was found to be in congestive heart failure, with a BNP elevated 231 troponin elevated at 41 trended down to 38 on repeat Respiratory panel negative chest x-ray showed edema Patient received Lasix IV 80 mg, DuoNebs, metoprolol, INR subtherapeutic 1.6. Patient used to take Lasix 40 mg daily BUN and creatinine were normal  Assessment & Plan:   Active Problems:   Atrial fibrillation (HCC)   Hx of Embolic stroke (HCC)   Essential hypertension   Chronic venous stasis dermatitis   Hypokalemia   Acute on chronic diastolic CHF (congestive heart failure) (HCC)  Acute hypoxic respiratory failure secondary to acute on chronic congestive heart failure with preserved ejection fraction: Still requiring as needed BiPAP and currently 5 L of oxygen.  Repeat chest x-ray 12/14/2020 does not show much improvement, he also developed fever of 100.6 the night of 12/16/2020.  Repeat procalcitonin significantly elevated over 2 which is improving on retesting today.  We will continue Zosyn.  We will obtain another chest x-ray today.  Bilateral upper lungs obesity: COVID, influenza negative.  At that time, procalcitonin unremarkable.  Lactic acid normal.  No leukocytosis.  He was afebrile.  I had doubts that he had any pneumonia.  Patient has worsened in last 24 hours with elevated procalcitonin and low-grade fever of 100.6, raising suspicion for possible aspiration pneumonia, Zosyn started on 12/16/2020.  Hypokalemia: Resolved.  Acute renal failure: Sudden and significant rise in in his creatinine to 2.35  secondary to his acute illness as well as A. fib with RVR, creatinine stable at that level again today.  Continue to hold Lasix (unless he has pulmonary edema on chest x-ray today ) and avoid nephrotoxic agents.  Permanent/persistent atrial fibrillation: Started on amiodarone drip but that was stopped by cardiology.  They started him on digoxin but his rates are still around 1 20-130.  Will defer to cardiology for management of this.  Acute encephalopathy: Once again he is lethargic today, too lethargic to hold any conversation.  Acute ischemic stroke: On 12/13/2020, patient was noted to have left-sided weakness.  He was last known normal at around 9:30 AM the same day.  Code stroke was called, neurology was consulted, patient underwent stroke work-up.  Initial CT head was negative however due to impressive stroke findings on clinical exam, patient received TNK, subsequently MRI showed small to moderate size right frontotemporal infarct.  Repeat head CT today does not show any difference.  Patient is still with significant left upper extremity weakness and some weakness in the left lower extremity.  On Eliquis now.  On statin.  Neurology signed off.  PT OT and SLP on board.  Moderate oropharyngeal dysphagia: Had issues with swallowing at multiple times.  Now s/p MBS 12/15/2020, diet downgraded to dysphagia 1 diet but I doubt that he is taking enough p.o. since he is so lethargic.  I have consulted nutrition today.  Hyperlipidemia: Continue atorvastatin.  GOC: Multiple family meetings with palliative care.  Per information, all the family members are now on same board and want the patient to be discharged home with hospice.  Hospice  liaison has been notified but I am not sure if he would be stable enough for transfer.  Waiting for palliative care to further clarify goals with the family.  I have reached out to them.  DVT prophylaxis: Eliquis   Code Status: DNR  Family Communication: Discussed with  nephew at the bedside today.  Status is: Inpatient  Remains inpatient appropriate because: Patient very sick.  Estimated body mass index is 27.84 kg/m as calculated from the following:   Height as of this encounter: 6\' 1"  (1.854 m).   Weight as of this encounter: 95.7 kg.  Nutritional Assessment: Body mass index is 27.84 kg/m.Marland Kitchen Seen by dietician.  I agree with the assessment and plan as outlined below: Nutrition Status:   Skin Assessment: I have examined the patient's skin and I agree with the wound assessment as performed by the wound care RN as outlined below:    Consultants:  Neurology ] Palliative care Cardiology  Procedures:  None  Antimicrobials:  Anti-infectives (From admission, onward)    Start     Dose/Rate Route Frequency Ordered Stop   12/16/20 1600  piperacillin-tazobactam (ZOSYN) IVPB 2.25 g        2.25 g 100 mL/hr over 30 Minutes Intravenous Every 8 hours 12/16/20 1512     12/13/20 2215  ceFEPIme (MAXIPIME) 2 g in sodium chloride 0.9 % 100 mL IVPB  Status:  Discontinued        2 g 200 mL/hr over 30 Minutes Intravenous Every 12 hours 12/13/20 2121 12/14/20 1155   12/13/20 1930  doxycycline (VIBRAMYCIN) 100 mg in sodium chloride 0.9 % 250 mL IVPB  Status:  Discontinued        100 mg 125 mL/hr over 120 Minutes Intravenous Every 12 hours 12/13/20 1835 12/16/20 1457   12/13/20 1230  ceFEPIme (MAXIPIME) 1 g in sodium chloride 0.9 % 100 mL IVPB  Status:  Discontinued        1 g 200 mL/hr over 30 Minutes Intravenous Every 12 hours 12/13/20 1131 12/13/20 2121          Subjective:  Seen and examined.  Patient once again too lethargic to hold any conversation today.  Nephew at the bedside.  Objective: Vitals:   12/18/20 0500 12/18/20 0755 12/18/20 1000 12/18/20 1137  BP:  124/72  118/78  Pulse:  (!) 115  (!) 125  Resp:  (!) 28 (!) 24 (!) 31  Temp:  98.4 F (36.9 C)  98.8 F (37.1 C)  TempSrc:  Oral  Oral  SpO2:  98% 95% 98%  Weight: 95.7 kg      Height:        Intake/Output Summary (Last 24 hours) at 12/18/2020 1333 Last data filed at 12/18/2020 1136 Gross per 24 hour  Intake 150 ml  Output 1950 ml  Net -1800 ml    Filed Weights   12/13/20 0344 12/14/20 0900 12/18/20 0500  Weight: 90.2 kg 87.1 kg 95.7 kg    Examination:  General exam: Appears very lethargic. Respiratory system: Diminished breath sounds with rhonchi at the bases bilaterally, poor inspiratory effort. Cardiovascular system: S1 & S2 heard, irregularly irregular rate and rhythm. No JVD, murmurs, rubs, gallops or clicks. No pedal edema. Gastrointestinal system: Abdomen is nondistended, soft and nontender. No organomegaly or masses felt. Normal bowel sounds heard. Central nervous system: Too lethargic to follow commands.  Data Reviewed: I have personally reviewed following labs and imaging studies  CBC: Recent Labs  Lab 01/09/2021 1400 12/12/20 0053 12/13/20 1250  12/14/20 0340 12/16/20 0919 12/17/20 0831 12/18/20 0245  WBC 7.9   < > 12.3* 9.8 26.3* 20.6* 17.0*  NEUTROABS 6.0  --   --  8.0* 23.3* 18.4* 14.9*  HGB 13.9   < > 13.8 13.4 13.9 13.1 13.5  HCT 43.4   < > 42.9 42.5 43.8 41.7 42.7  MCV 87.7   < > 88.5 89.5 89.8 90.1 90.5  PLT PLATELET CLUMPS NOTED ON SMEAR, UNABLE TO ESTIMATE   < > PLATELET CLUMPS NOTED ON SMEAR, UNABLE TO ESTIMATE PLATELET CLUMPS NOTED ON SMEAR, UNABLE TO ESTIMATE 215 PLATELET CLUMPS NOTED ON SMEAR, UNABLE TO ESTIMATE PLATELET CLUMPS NOTED ON SMEAR, UNABLE TO ESTIMATE   < > = values in this interval not displayed.    Basic Metabolic Panel: Recent Labs  Lab 01/05/2021 1550 12/14/2020 2123 12/12/20 1423 12/13/20 1250 12/14/20 0340 12/15/20 0207 12/16/20 0919 12/17/20 0831 12/18/20 0245  NA  --    < > 137 132* 134* 129* 135 136 138  K  --    < > 3.8 3.5 3.4* 4.1 4.1 3.9 3.7  CL  --    < > 95* 91* 92* 89* 94* 95* 94*  CO2  --    < > 33* 32 32 27 28 28 31   GLUCOSE  --    < > 87 138* 93 188* 157* 134* 130*  BUN  --    < >  12 19 22  24* 55* 79* 93*  CREATININE  --    < > 1.17 1.19 1.25* 1.22 2.35* 2.33* 2.40*  CALCIUM  --    < > 9.0 9.0 8.9 8.9 9.0 9.0 8.6*  MG 1.9  --  2.0 2.0  --   --  2.3 2.5*  --    < > = values in this interval not displayed.    GFR: Estimated Creatinine Clearance: 24 mL/min (A) (by C-G formula based on SCr of 2.4 mg/dL (H)). Liver Function Tests: Recent Labs  Lab 12/13/2020 1400 12/12/20 0053 12/13/20 1250 12/16/20 0919  AST 23 27 31 22   ALT 16 19 17 15   ALKPHOS 55 58 58 60  BILITOT 0.8 0.9 1.8* 0.8  PROT 8.6* 7.9 7.8 8.1  ALBUMIN 4.0 3.2* 3.1* 2.7*    No results for input(s): LIPASE, AMYLASE in the last 168 hours. No results for input(s): AMMONIA in the last 168 hours. Coagulation Profile: Recent Labs  Lab 12/26/2020 1400 12/12/20 0053 12/13/20 0200 12/14/20 0340 12/15/20 0207  INR 1.6* 1.6* 1.6* 1.9* 2.0*    Cardiac Enzymes: No results for input(s): CKTOTAL, CKMB, CKMBINDEX, TROPONINI in the last 168 hours. BNP (last 3 results) No results for input(s): PROBNP in the last 8760 hours. HbA1C: No results for input(s): HGBA1C in the last 72 hours.  CBG: Recent Labs  Lab 12/13/20 1044 12/14/20 0728  GLUCAP 166* 80    Lipid Profile: No results for input(s): CHOL, HDL, LDLCALC, TRIG, CHOLHDL, LDLDIRECT in the last 72 hours.  Thyroid Function Tests: No results for input(s): TSH, T4TOTAL, FREET4, T3FREE, THYROIDAB in the last 72 hours. Anemia Panel: No results for input(s): VITAMINB12, FOLATE, FERRITIN, TIBC, IRON, RETICCTPCT in the last 72 hours. Sepsis Labs: Recent Labs  Lab 01/05/2021 1400 01/04/2021 1550 12/13/20 1250 12/13/20 1506 12/16/20 0919 12/18/20 0245  PROCALCITON  --   --  0.11  --  2.06 1.43  LATICACIDVEN 1.3 1.4 1.6 1.3  --   --      Recent Results (from the past 240 hour(s))  Resp Panel by RT-PCR (Flu A&B, Covid) Nasopharyngeal Swab     Status: None   Collection Time: 01/03/2021  2:00 PM   Specimen: Nasopharyngeal Swab; Nasopharyngeal(NP)  swabs in vial transport medium  Result Value Ref Range Status   SARS Coronavirus 2 by RT PCR NEGATIVE NEGATIVE Final    Comment: (NOTE) SARS-CoV-2 target nucleic acids are NOT DETECTED.  The SARS-CoV-2 RNA is generally detectable in upper respiratory specimens during the acute phase of infection. The lowest concentration of SARS-CoV-2 viral copies this assay can detect is 138 copies/mL. A negative result does not preclude SARS-Cov-2 infection and should not be used as the sole basis for treatment or other patient management decisions. A negative result may occur with  improper specimen collection/handling, submission of specimen other than nasopharyngeal swab, presence of viral mutation(s) within the areas targeted by this assay, and inadequate number of viral copies(<138 copies/mL). A negative result must be combined with clinical observations, patient history, and epidemiological information. The expected result is Negative.  Fact Sheet for Patients:  EntrepreneurPulse.com.au  Fact Sheet for Healthcare Providers:  IncredibleEmployment.be  This test is no t yet approved or cleared by the [place] FDA and  has been authorized for detection and/or diagnosis of SARS-CoV-2 by FDA under an Emergency Use Authorization (EUA). This EUA will remain  in effect (meaning this test can be used) for the duration of the COVID-19 declaration under Section 564(b)(1) of the Act, 21 U.S.C.section 360bbb-3(b)(1), unless the authorization is terminated  or revoked sooner.       Influenza A by PCR NEGATIVE NEGATIVE Final   Influenza B by PCR NEGATIVE NEGATIVE Final    Comment: (NOTE) The Xpert Xpress SARS-CoV-2/FLU/RSV plus assay is intended as an aid in the diagnosis of influenza from Nasopharyngeal swab specimens and should not be used as a sole basis for treatment. Nasal washings and aspirates are unacceptable for Xpert Xpress  SARS-CoV-2/FLU/RSV testing.  Fact Sheet for Patients: EntrepreneurPulse.com.au  Fact Sheet for Healthcare Providers: IncredibleEmployment.be  This test is not yet approved or cleared by the [place] FDA and has been authorized for detection and/or diagnosis of SARS-CoV-2 by FDA under an Emergency Use Authorization (EUA). This EUA will remain in effect (meaning this test can be used) for the duration of the COVID-19 declaration under Section 564(b)(1) of the Act, 21 U.S.C. section 360bbb-3(b)(1), unless the authorization is terminated or revoked.  Performed at [facility], [address], [place], [place] [address]   MRSA Next Gen by PCR, Nasal     Status: None   Collection Time: 12/13/20  3:01 PM   Specimen: Nasal Mucosa; Nasal Swab  Result Value Ref Range Status   MRSA by PCR Next Gen NOT DETECTED NOT DETECTED Final    Comment: (NOTE) The GeneXpert MRSA Assay (FDA approved for NASAL specimens only), is one component of a comprehensive MRSA colonization surveillance program. It is not intended to diagnose MRSA infection nor to guide or monitor treatment for MRSA infections. Test performance is not FDA approved in patients less than 44 years old. Performed at [facility], [place] [address], [place], [place] [address]        Radiology Studies: No results found.  Scheduled Meds:  apixaban  2.5 mg Oral BID   atorvastatin  20 mg Oral Daily   digoxin  0.125 mg Intravenous Q72H   metoprolol succinate  100 mg Oral Daily   sodium chloride flush  3 mL Intravenous Q12H   Continuous Infusions:  sodium chloride Stopped (12/14/20 1151)   piperacillin-tazobactam (ZOSYN)  IV 2.25 g (12/18/20 0650)     LOS: 7 days   Time spent: 31 minutes   Darliss Cheney, MD [facility]  12/18/2020, 1:33 PM  Please page via [place] and do not message via secure chat for anything urgent. Secure chat can be used for  anything non urgent.  How to contact the [facility] Attending or Consulting provider [place] or covering provider during after hours [place], for this patient?  Check the care team in [facility] and look for a) attending/consulting TR[facility] provider listed and b) the [facility] team listed. Page or secure chat [phone]. Log into [email or web address] and use [place]'s universal password to access. If you do not have the password, please contact the hospital operator. Locate the [facility] provider you are looking for under [facility] and page to a number that you can be directly reached. If you still have difficulty reaching the provider, please page the [facility] (Director on Call) for the Hospitalists listed on amion for assistance.

## 2020-12-18 NOTE — Progress Notes (Signed)
OT Cancellation Note  Patient Details Name: [name] MRN: [id number] DOB: 1930/09/05   Cancelled Treatment:    Reason Eval/Treat Not Completed: Patient not medically ready (nursing states patient is expected to go to comfort measures) Lodema Hong, [place]  Pager [phone] Office [place] 12/18/2020, 1:11 PM

## 2020-12-19 MED ORDER — METOPROLOL SUCCINATE ER 200 MG PO TB24
200.0000 mg | ORAL_TABLET | Freq: Every day | ORAL | 0 refills | Status: AC
Start: 2020-12-19 — End: 2021-01-18

## 2020-12-19 MED ORDER — GLYCOPYRROLATE 1 MG PO TABS: 1.0000 mg | ORAL_TABLET | ORAL | 0 refills | Status: AC | PRN

## 2020-12-19 MED ORDER — DIGOXIN 0.25 MG/ML IJ SOLN
0.1250 mg | Freq: Once | INTRAMUSCULAR | Status: AC
Start: 2020-12-19 — End: 2020-12-19
  Administered 2020-12-19: 0.125 mg via INTRAVENOUS
  Filled 2020-12-19: qty 0.5

## 2020-12-19 MED ORDER — METOPROLOL TARTRATE 5 MG/5ML IV SOLN
5.0000 mg | Freq: Once | INTRAVENOUS | Status: AC
  Administered 2020-12-19: 5 mg via INTRAVENOUS
  Filled 2020-12-19: qty 5

## 2020-12-19 MED ORDER — HALOPERIDOL LACTATE 2 MG/ML PO CONC: 0.5000 mg | ORAL | 0 refills | Status: AC | PRN

## 2020-12-19 MED ORDER — LORAZEPAM 2 MG/ML PO CONC
1.0000 mg | ORAL | 0 refills | Status: AC | PRN
Start: 2020-12-19 — End: ?

## 2021-01-10 DIAGNOSIS — 419620001 Death: Secondary | SNOMED CT | POA: Diagnosis not present

## 2021-01-10 NOTE — Discharge Summary (Signed)
Physician Discharge Summary  RUSTON FEDORA FXT:[id number] DOB: 1930-09-21 DOA: 12/27/2020  PCP: Gaynelle Arabian, MD  Admit date: 01/01/2021 Discharge date: 12-26-20 30 Day Unplanned Readmission Risk Score    Flowsheet Row ED to Hosp-Admission (Current) from 01/04/2021 in [facility]  30 Day Unplanned Readmission Risk Score (%) 27.09 Filed at 12-26-2020 0800       This score is the patient's risk of an unplanned readmission within 30 days of being discharged (0 -100%). The score is based on dignosis, age, lab data, medications, orders, and past utilization.   Low:  0-14.9   Medium: 15-21.9   High: 22-29.9   Extreme: 30 and above          Admitted From: Home Disposition: Home with hospice  Recommendations for Outpatient Follow-up:  Follow up with PCP in 1-2 weeks Please obtain BMP/CBC in one week Please follow up with your PCP on the following pending results: Unresulted Labs (From admission, onward)    None         Home Health: None Equipment/Devices: Multiple DME  Discharge Condition: Critical CODE STATUS: DNR Diet recommendation: Comfort diet  Subjective: Seen and examined.  Multiple family members present at bedside.  Patient in critical condition with heart rate around 150s in atrial fibrillation.  Patient looks comfortable though.  On 6 L of oxygen.  Brief/Interim Summary: 88 years old male with past medical history of CHF, persistent atrial fibrillation, hypertension, history of CVA, thrombocytopenia, came with a chief complaint of shortness of breath.  The symptoms started 1 week ago. His saturation dropped at home in the 70s. In the emergency room he was found to be in congestive heart failure, with a BNP elevated 231 troponin elevated at 41 trended down to 38 on repeat. Respiratory panel negative chest x-ray showed edema Patient received Lasix IV 80 mg, DuoNebs, metoprolol, INR subtherapeutic 1.6. Patient used to take Lasix 40 mg daily BUN  and creatinine were normal.  He was admitted with following medical problems and management as mentioned in detail   Acute hypoxic respiratory failure secondary to acute on chronic congestive heart failure with preserved ejection fraction: Patient received IV diuresis.  He was constantly on 6 L oxygen with intermittent BiPAP requirement.  Due to rising creatinine 2 days ago, Lasix was stopped.   Bilateral upper lungs obesity: COVID, influenza negative.  At that time, procalcitonin unremarkable.  Lactic acid normal.  No leukocytosis.  He was afebrile.  I had doubts that he had any pneumonia and thus antibiotics were stopped.  In the meantime, he had a stroke, problem with swallowing and he became high risk for aspiration pneumonia, almost 48 hours after stopping antibiotics he developed low-grade fever of 100.6, raising suspicion for possible aspiration pneumonia, Zosyn started on 12/16/2020.   Hypokalemia: Resolved.   Acute renal failure: Sudden and significant rise in in his creatinine to 2.35 secondary to his acute illness as well as A. fib with RVR,    Permanent/persistent atrial fibrillation with RVR: He became sicker, rates became uncontrolled, we started on amiodarone drip but that was stopped by cardiology.  They started him on digoxin but his rates are still around 1 20-130.  In the meantime, patient was transitioned to full comfort care yesterday.  Currently his heart rate is around 154.  Patient comfortable and has no symptoms, likely secondary to lethargy.  Grandson who is an EMT is requesting some sort of medications to control his heart rate so he can survive  getting to home and his wife can meet him.  Patient not in a condition to take p.o. medications so Toprol-XL was not given.  We will give him 5 mg of IV Lopressor as well as 0.125 mg of IV digoxin before discharge.   Acute encephalopathy: Encephalopathic, actively dying.   Acute ischemic stroke: On 12/13/2020, patient was noted to have  left-sided weakness.  He was last known normal at around 9:30 AM the same day.  Code stroke was called, neurology was consulted, patient underwent stroke work-up.  Initial CT head was negative however due to impressive stroke findings on clinical exam, patient received TNK, subsequently MRI showed small to moderate size right frontotemporal infarct.  Repeat head CT today does not show any difference.     Moderate oropharyngeal dysphagia: Had issues with swallowing at multiple times.  Now s/p MBS 12/15/2020, diet downgraded to dysphagia 1 diet but patient has remained too lethargic to eat anything since last 2 days.   Hyperlipidemia: Continue atorvastatin.   GOC: Multiple family meetings with palliative care.  Per family's request, cardiology was also consulted.  Family was informed about grave prognosis and critical situation of the patient.  After all of this and multiple meetings, family decided to pursue home with hospice and patient was transitioned to comfort care on the afternoon of 12/18/2020.  Family wished to take him home so his wife can meet him as she is unable to come to the hospital.  Family was made aware that patient is in critical condition and can die in route in this situation and family agreed to take that risk.  He is being discharged today.  All DME's have been delivered to his home.   Discharge Diagnoses:  Active Problems:   Atrial fibrillation (HCC)   Hx of Embolic stroke [facility])   Essential hypertension   Chronic venous stasis dermatitis   Hypokalemia   Acute on chronic diastolic CHF (congestive heart failure) ([place])    Discharge Instructions   Allergies as of 12/21/20   No Known Allergies      Medication List     STOP taking these medications    traMADol 50 MG tablet Commonly known as: ULTRAM   warfarin 1 MG tablet Commonly known as: COUMADIN   warfarin 3 MG tablet Commonly known as: COUMADIN   warfarin 5 MG tablet Commonly known as: COUMADIN        TAKE these medications    acetaminophen 500 MG tablet Commonly known as: TYLENOL Take 500-1,000 mg by mouth every 6 (six) hours as needed (FOR PAIN.).   albuterol 108 (90 Base) MCG/ACT inhaler Commonly known as: VENTOLIN HFA Inhale 1 puff into the lungs every 4 (four) hours as needed for wheezing or shortness of breath.   aspirin EC 81 MG tablet Take 81 mg by mouth at bedtime.   calcium carbonate 600 MG Tabs tablet Commonly known as: OS-CAL Take 600 mg by mouth daily.   furosemide 40 MG tablet Commonly known as: LASIX Take 1 tablet (40 mg total) by mouth daily.   Glucosamine Chond Complex/MSM Tabs Take 1 tablet by mouth daily. Glucosamine HCl Chondroitin MSM Complex 1500-1103 mg   GLUCOSAMINE PO Take 1,500 mg by mouth daily.   Glucosamine Sulfate 500 MG Tabs Take 500 mg by mouth daily.   glycopyrrolate 1 MG tablet Commonly known as: ROBINUL Take 1 tablet (1 mg total) by mouth every 4 (four) hours as needed for up to 10 days (excessive secretions).   haloperidol 2  MG/ML solution Commonly known as: HALDOL Place 0.3 mLs (0.6 mg total) under the tongue every 4 (four) hours as needed for up to 10 days for agitation (or delirium).   LORazepam 2 MG/ML concentrated solution Commonly known as: ATIVAN Place 0.5 mLs (1 mg total) under the tongue every 4 (four) hours as needed for anxiety.   metoprolol 200 MG 24 hr tablet Commonly known as: TOPROL-XL Take 1 tablet (200 mg total) by mouth daily. Take with or immediately following a meal. What changed:  medication strength how much to take   multivitamin tablet Take 1 tablet by mouth daily.   PreserVision AREDS 2 Caps Take 1 capsule by mouth 2 (two) times daily.        Follow-up Information     Gaynelle Arabian, MD Follow up in 1 week(s).   Specialty: Family Medicine Contact information: [address], [place] [address] [phone]         Belva Crome, MD .   Specialty:  Cardiology Contact information: [phone] [place] [address] [phone]                No Known Allergies  Consultations: Cardiology and neurology   Procedures/Studies: DG Chest 1 View  Result Date: 12/13/2020 CLINICAL DATA:  Shortness of breath EXAM: CHEST  1 VIEW COMPARISON:  12/12/2020 FINDINGS: Cardiac shadow is stable. Aortic calcifications are again seen. Elevation of the right hemidiaphragm is noted. Vascular congestion is noted with interstitial edema increased when compared with the prior study. No acute bony abnormality is seen. IMPRESSION: Increased vascular congestion and interstitial edema consistent with CHF. Electronically Signed   By: Inez Catalina M.D.   On: 12/13/2020 20:29   CT HEAD WO CONTRAST (5MM)  Result Date: 12/14/2020 CLINICAL DATA:  Stroke follow-up EXAM: CT HEAD WITHOUT CONTRAST TECHNIQUE: Contiguous axial images were obtained from the base of the skull through the vertex without intravenous contrast. COMPARISON:  Head CT 12/13/2020 Brain MRI 12/13/2020 FINDINGS: Brain: There is no mass, hemorrhage or extra-axial collection. There is generalized atrophy without lobar predilection. Hypodensity of the white matter is most commonly associated with chronic microvascular disease. Old left PCA territory infarct. Vascular: No abnormal hyperdensity of the major intracranial arteries or dural venous sinuses. No intracranial atherosclerosis. Skull: The visualized skull base, calvarium and extracranial soft tissues are normal. Sinuses/Orbits: No fluid levels or advanced mucosal thickening of the visualized paranasal sinuses. No mastoid or middle ear effusion. The orbits are normal. IMPRESSION: 1. No acute intracranial abnormality. 2. Old left PCA territory infarct and findings of chronic microvascular ischemia. Electronically Signed   By: Ulyses Jarred M.D.   On: 12/14/2020 01:33   MR BRAIN WO CONTRAST  Result Date: 12/13/2020 CLINICAL DATA:   Transient ischemic attack (TIA) EXAM: MRI HEAD WITHOUT CONTRAST TECHNIQUE: Multiplanar, multiecho pulse sequences of the brain and surrounding structures were obtained without intravenous contrast. COMPARISON:  Prior CT imaging FINDINGS: Motion degraded DWI and axial T2 FLAIR sequences were obtained. Patient could not tolerate remainder of the study. Area of reduced diffusion in the right frontoparietal lobes. Prominent ventricles and sulci reflecting parenchymal volume loss. Relative prominence of the ventricles as before. Chronic left occipital lobe infarct. Chronic right caudate infarct. IMPRESSION: Patient could not tolerate study beyond acquired sequences and motion artifact is present. Acute small to moderate size right frontoparietal infarct. Electronically Signed   By: Macy Mis M.D.   On: 12/13/2020 14:54   DG CHEST PORT 1  VIEW  Result Date: 12/18/2020 CLINICAL DATA:  Tachycardia EXAM: PORTABLE CHEST 1 VIEW COMPARISON:  12/14/2020, 10/18/2020 FINDINGS: Elevation of the right diaphragm. Enteral contrast in the colon and stomach. Atelectasis at the bases. Cardiomegaly with vascular congestion. No pneumothorax. IMPRESSION: Cardiomegaly with mild atelectasis at the bases. Electronically Signed   By: Donavan Foil M.D.   On: 12/18/2020 16:43   DG CHEST PORT 1 VIEW  Result Date: 12/14/2020 CLINICAL DATA:  Heart failure. EXAM: PORTABLE CHEST 1 VIEW COMPARISON:  December 13, 2020. FINDINGS: The heart size and mediastinal contours are within normal limits. Mild bibasilar opacities are noted concerning for edema or possibly atelectasis. The visualized skeletal structures are unremarkable. IMPRESSION: Mild bibasilar edema or atelectasis. Electronically Signed   By: Marijo Conception M.D.   On: 12/14/2020 14:46   DG Chest Port 1 View  Result Date: 12/12/2020 CLINICAL DATA:  Hypoxia/CHF. EXAM: PORTABLE CHEST 1 VIEW COMPARISON:  Chest x-rays dated 12/10/2020, 10/18/2020 and 10/12/2009. FINDINGS: Heart size  and mediastinal contours are stable. Stable bilateral interstitial prominence. Increased opacity within the RIGHT upper lung. Probable mild bibasilar atelectasis. Chronic elevation of the RIGHT hemidiaphragm. Osseous structures about the chest are unremarkable. IMPRESSION: 1. Increased opacity within the RIGHT upper lung, suspicious for pneumonia, alternatively asymmetric edema or atelectasis. 2. Bilateral interstitial prominence, suggesting mild edema versus chronic interstitial lung disease. 3. Probable mild bibasilar atelectasis. Electronically Signed   By: Franki Cabot M.D.   On: 12/12/2020 08:39   DG Chest Portable 1 View  Result Date: 12/30/2020 CLINICAL DATA:  Shortness of breath. EXAM: PORTABLE CHEST 1 VIEW COMPARISON:  10/18/2020 FINDINGS: Low volume, AP portable radiograph. Patient rotated left. Cardiomegaly accentuated by AP portable technique. Atherosclerosis in the transverse aorta. Moderate right hemidiaphragm elevation. No large pleural effusion. No pneumothorax. Pulmonary interstitial prominence, accentuated by AP portable technique and low lung volumes. Possible left greater than right base airspace disease. IMPRESSION: Multifocal degradation, including patient positioning and AP portable technique. Cardiomegaly with suspicion of mild interstitial edema. Cannot exclude bibasilar airspace disease. Consider PA and lateral radiographs if feasible. Aortic Atherosclerosis (ICD10-I70.0). Electronically Signed   By: Abigail Miyamoto M.D.   On: 12/22/2020 14:08   DG Swallowing Func-Speech Pathology  Result Date: 12/15/2020 Table formatting from the original result was not included. Objective Swallowing Evaluation: Type of Study: MBS-Modified Barium Swallow Study  Patient Details Name: DARYUS SOWASH MRN: [id number] Date of Birth: 30-Jun-1930 Today's Date: 12/15/2020 Time: SLP Start Time (ACUTE ONLY): 0910 -SLP Stop Time (ACUTE ONLY): 7564 SLP Time Calculation (min) (ACUTE ONLY): 15 min Past Medical History:  Past Medical History: Diagnosis Date  Atrial fibrillation ([place]) 10/15/2012  Cancer ([place])   Skin (R hand)  Embolic stroke ([place]) 33/29/5188  Essential hypertension 01/21/2014  Long term current use of anticoagulant therapy 12/25/2012 Past Surgical History: Past Surgical History: Procedure Laterality Date  APPENDECTOMY    CARDIAC CATHETERIZATION    CYSTOSCOPY WITH INSERTION OF UROLIFT N/A 10/10/2017  Procedure: CYSTOSCOPY WITH INSERTION OF UROLIFT;  Surgeon: [name], MD;  Location: [facility]S;  Service: Urology;  Laterality: N/A;  NEEDS 60 MIN  EYE SURGERY Bilateral 2013  Cataracts HPI: Pt is an 88 y.o. male who presented initially  with ongoing shortness of breath. Dx acute enceophalopathy. CXR 12/3: Increased opacity within the right upper lung, suspicious for pneumonia, alternatively asymmetric edema or atelectasis. Pt was evaluated by SLP on 9/4 at 9 am, foudn to be toelrating PO with a possible mild baseline dysphagia. Later that morning a code  stroke was called; patient was noted to have left-sided weakness.  He was last known normal at around 9:30 AM the same day.   Initial CT head was negative however due to impressive stroke findings on clinical exam, patient received TNK, subsequently MRI showed small to moderate size right frontotemporal infarct.  PMH: CHF, atrial fibrillation, venous stasis, hypertension, history of CVA, thrombocytopenia.  No data recorded  Recommendations for follow up therapy are one component of a multi-disciplinary discharge planning process, led by the attending physician.  Recommendations may be updated based on patient status, additional functional criteria and insurance authorization. Assessment / Plan / Recommendation Clinical Impressions 12/15/2020 Clinical Impression Pt demonstrates a moderate oropharyngeal dysphagia with slightly delayed/incomplete laryngeal closure prior to swallow with large boluses of nectar and thin liquids. This results in instances of trace silent  aspiration of thin and nectar. Pt did have a cough with slightly larger quantity of aspiration. Using a cup to restrict and control bolus size prevented aspiration with thin and nectar. A chin tuck was not beneficial. Pt additionally had prolonged mastication of solids and required cues to complete oral transit of solids in some cases. Esophageal sweep unremarkable. Suspect pt is having trace aspiration events with liquids, eventually accumulating to finally trigger coughing by the end of the meal. Pt is recommneded to consume a highly conservative diet to prevent coughing and vomiting (seen yesterday) until strength and mentation improves as he is slightly sluggish which may be acutely contributing at this time. Recommend dys 1 (puree) and nectar thick liquids NO STRAWS. Will f/u, recommend AIR. SLP Visit Diagnosis Dysphagia, unspecified (R13.10) Attention and concentration deficit following -- Frontal lobe and executive function deficit following -- Impact on safety and function Moderate aspiration risk   Treatment Recommendations 12/15/2020 Treatment Recommendations Therapy as outlined in treatment plan below   Prognosis 12/15/2020 Prognosis for Safe Diet Advancement Good Barriers to Reach Goals -- Barriers/Prognosis Comment -- Diet Recommendations 12/15/2020 SLP Diet Recommendations Dysphagia 1 (Puree) solids;Nectar thick liquid Liquid Administration via Cup;No straw Medication Administration Whole meds with puree Compensations Slow rate;Small sips/bites;Clear throat intermittently Postural Changes --   Other Recommendations 12/15/2020 Recommended Consults -- Oral Care Recommendations -- Other Recommendations -- Follow Up Recommendations Acute inpatient rehab (3hours/day) Assistance recommended at discharge Frequent or constant Supervision/Assistance Functional Status Assessment Patient has had a recent decline in their functional status and demonstrates the ability to make significant improvements in function in a  reasonable and predictable amount of time. Frequency and Duration  12/15/2020 Speech Therapy Frequency (ACUTE ONLY) min 2x/week Treatment Duration 2 weeks   Oral Phase 12/15/2020 Oral Phase Impaired Oral - Pudding Teaspoon -- Oral - Pudding Cup -- Oral - Honey Teaspoon -- Oral - Honey Cup -- Oral - Nectar Teaspoon -- Oral - Nectar Cup WFL Oral - Nectar Straw WFL Oral - Thin Teaspoon -- Oral - Thin Cup WFL Oral - Thin Straw WFL Oral - Puree WFL Oral - Mech Soft -- Oral - Regular Decreased bolus cohesion;Delayed oral transit;Piecemeal swallowing;Lingual/palatal residue;Left pocketing in lateral sulci;Right pocketing in lateral sulci;Reduced posterior propulsion Oral - Multi-Consistency Decreased bolus cohesion;Delayed oral transit;Piecemeal swallowing;Lingual/palatal residue;Left pocketing in lateral sulci;Right pocketing in lateral sulci;Reduced posterior propulsion Oral - Pill Decreased bolus cohesion;Delayed oral transit;Piecemeal swallowing;Lingual/palatal residue;Left pocketing in lateral sulci;Right pocketing in lateral sulci;Reduced posterior propulsion Oral Phase - Comment --  Pharyngeal Phase 12/15/2020 Pharyngeal Phase Impaired Pharyngeal- Pudding Teaspoon -- Pharyngeal -- Pharyngeal- Pudding Cup -- Pharyngeal -- Pharyngeal- Honey Teaspoon -- Pharyngeal -- Pharyngeal- Honey  Cup -- Pharyngeal -- Pharyngeal- Nectar Teaspoon -- Pharyngeal -- Pharyngeal- Nectar Cup WFL Pharyngeal -- Pharyngeal- Nectar Straw Penetration/Aspiration before swallow Pharyngeal Material enters airway, passes BELOW cords without attempt by patient to eject out (silent aspiration) Pharyngeal- Thin Teaspoon -- Pharyngeal -- Pharyngeal- Thin Cup Penetration/Aspiration before swallow Pharyngeal Material enters airway, remains ABOVE vocal cords then ejected out Pharyngeal- Thin Straw Penetration/Aspiration before swallow Pharyngeal Material enters airway, passes BELOW cords without attempt by patient to eject out (silent aspiration)  Pharyngeal- Puree WFL Pharyngeal -- Pharyngeal- Mechanical Soft -- Pharyngeal -- Pharyngeal- Regular WFL Pharyngeal -- Pharyngeal- Multi-consistency WFL Pharyngeal -- Pharyngeal- Pill WFL Pharyngeal -- Pharyngeal Comment --  No flowsheet data found. De[place], Katherene Ponto 12/15/2020, 11:13 AM                     ECHOCARDIOGRAM COMPLETE  Result Date: 12/13/2020    ECHOCARDIOGRAM REPORT   Patient Name:   KLAUS CASTENEDA Date of Exam: 12/13/2020 Medical Rec #:  [id number]     Height:       73.0 in Accession #:    [id number]    Weight:       198.9 lb Date of Birth:  Sep 17, 1930     BSA:          2.146 m Patient Age:    74 years      BP:           132/75 mmHg Patient Gender: M             HR:           101 bpm. Exam Location:  Inpatient Procedure: 2D Echo, Cardiac Doppler and Color Doppler Indications:    Stroke  History:        Patient has prior history of Echocardiogram examinations, most                 recent 10/15/2020. CHF; Arrythmias:Tachycardia and Atrial                 Fibrillation.  Sonographer:    Merrie Roof RDCS Referring Phys: [id number] [place]  1. Left ventricular ejection fraction, by estimation, is 60 to 65%. The left ventricle has normal function. The left ventricle has no regional wall motion abnormalities. There is mild left ventricular hypertrophy of the basal segment. Left ventricular diastolic parameters are indeterminate.  2. Unable to assess RVSP, IVC not well visualized. Right ventricular systolic function is normal. The right ventricular size is normal.  3. Left atrial size was severely dilated.  4. Right atrial size was moderately dilated.  5. The mitral valve is grossly normal. Mild to moderate mitral valve regurgitation.  6. The aortic valve is calcified. Aortic valve regurgitation is trivial. Aortic valve sclerosis/calcification is present, without any evidence of aortic stenosis.  7. The inferior vena cava not well visualized. Comparison(s): No significant change from  prior study. FINDINGS  Left Ventricle: Left ventricular ejection fraction, by estimation, is 60 to 65%. The left ventricle has normal function. The left ventricle has no regional wall motion abnormalities. The left ventricular internal cavity size was normal in size. There is  mild left ventricular hypertrophy of the basal segment. Left ventricular diastolic parameters are indeterminate. Right Ventricle: Unable to assess RVSP, IVC not well visualized. The right ventricular size is normal. No increase in right ventricular wall thickness. Right ventricular systolic function is normal. Left Atrium: Left atrial size was severely dilated. Right Atrium: Right atrial size was  moderately dilated. Pericardium: There is no evidence of pericardial effusion. Mitral Valve: The mitral valve is grossly normal. There is moderate calcification of the mitral valve leaflet(s). Mild to moderate mitral valve regurgitation. Tricuspid Valve: The tricuspid valve is normal in structure. Tricuspid valve regurgitation is mild. Aortic Valve: The aortic valve is calcified. Aortic valve regurgitation is trivial. Aortic valve sclerosis/calcification is present, without any evidence of aortic stenosis. Aortic valve mean gradient measures 3.0 mmHg. Aortic valve peak gradient measures 5.8 mmHg. Aortic valve area, by VTI measures 2.02 cm. Pulmonic Valve: The pulmonic valve was not well visualized. Pulmonic valve regurgitation is not visualized. Aorta: The aortic root is normal in size and structure. Venous: The inferior vena cava not well visualized. IAS/Shunts: No atrial level shunt detected by color flow Doppler.  LEFT VENTRICLE PLAX 2D LVIDd:         4.40 cm LVIDs:         2.80 cm LV PW:         1.00 cm LV IVS:        1.40 cm LVOT diam:     2.10 cm LV SV:         39 LV SV Index:   18 LVOT Area:     3.46 cm  RIGHT VENTRICLE RV Basal diam:  3.50 cm RV S prime:     11.50 cm/s TAPSE (M-mode): 1.5 cm LEFT ATRIUM           Index        RIGHT ATRIUM            Index LA diam:      5.10 cm 2.38 cm/m   RA Area:     14.10 cm LA Vol (A2C): 53.2 ml 24.79 ml/m  RA Volume:   29.00 ml  13.51 ml/m LA Vol (A4C): 88.9 ml 41.42 ml/m  AORTIC VALVE AV Area (Vmax):    2.44 cm AV Area (Vmean):   2.50 cm AV Area (VTI):     2.02 cm AV Vmax:           120.00 cm/s AV Vmean:          82.700 cm/s AV VTI:            0.194 m AV Peak Grad:      5.8 mmHg AV Mean Grad:      3.0 mmHg LVOT Vmax:         84.40 cm/s LVOT Vmean:        59.700 cm/s LVOT VTI:          0.113 m LVOT/AV VTI ratio: 0.58  AORTA Ao Root diam: 3.60 cm TRICUSPID VALVE TR Peak grad:   24.8 mmHg TR Vmax:        249.00 cm/s  SHUNTS Systemic VTI:  0.11 m Systemic Diam: 2.10 cm Phineas Inches Electronically signed by Phineas Inches Signature Date/Time: 12/13/2020/1:34:27 PM    Final    CT HEAD CODE STROKE WO CONTRAST  Result Date: 12/13/2020 CLINICAL DATA:  Code stroke.  88 year old male. EXAM: CT HEAD WITHOUT CONTRAST TECHNIQUE: Contiguous axial images were obtained from the base of the skull through the vertex without intravenous contrast. COMPARISON:  Head CT 10/14/2020.  Brain MRI 05/05/2006. FINDINGS: Brain: Stable cerebral volume. Nonspecific ventriculomegaly including cavum septum pellucidum. No midline shift, mass effect, or evidence of intracranial mass lesion. No acute intracranial hemorrhage identified. 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 Chronic right caudate lacunar infarct. Patchy additional bilateral white matter hypodensity. No  cortically based acute infarct identified. Vascular: Intracranial artery ectasia. Calcified atherosclerosis at the skull base. No suspicious intracranial vascular hyperdensity. Skull: Motion artifact at the skull base. No acute osseous abnormality identified. Sinuses/Orbits: Visualized paranasal sinuses and mastoids are clear. Other: Slight rightward gaze. No other acute orbit or scalp soft tissue finding. ASPECTS [facility] Stroke Program Early CT Score) Total  score (0-10 with 10 being normal): 10 IMPRESSION: 1. No acute cortically based infarct or acute intracranial hemorrhage identified. ASPECTS 10. 2. Chronic Left PCA territory infarct and right caudate lacune. 3. These results were communicated to Dr. Cheral Marker at 10:50 am on 12/13/2020 by text page via the [facility] messaging system. Electronically Signed   By: Genevie Ann M.D.   On: 12/13/2020 10:51   VAS US CAROTID  Result Date: 12/14/2020 Carotid Arterial Duplex Study Patient Name:  TALON REGALA  Date of Exam:   12/12/2020 Medical Rec #: [id number]      Accession #:    [id number] Date of Birth: February 20, 1930      Patient Gender: M Patient Age:   66 years Exam Location:  [facility] Procedure:      VAS US CAROTID Referring Phys: Riki Rusk CRISTESCU --------------------------------------------------------------------------------  Indications:       TIA and Altered mental status. Risk Factors:      Hypertension, past history of smoking, prior CVA. Other Factors:     Atrial fibrillation. Limitations        Today's exam was limited due to Altered mental status and                    positioning. Comparison Study:  No prior study on file Performing Technologist: Sharion Dove RVS  Examination Guidelines: A complete evaluation includes B-mode imaging, spectral Doppler, color Doppler, and power Doppler as needed of all accessible portions of each vessel. Bilateral testing is considered an integral part of a complete examination. Limited examinations for reoccurring indications may be performed as noted.  Right Carotid Findings: +----------+--------+--------+--------+------------------+---------+           PSV cm/sEDV cm/sStenosisPlaque DescriptionComments  +----------+--------+--------+--------+------------------+---------+ CCA Prox  42      14              heterogenous                +----------+--------+--------+--------+------------------+---------+ CCA Distal42      15              heterogenous                 +----------+--------+--------+--------+------------------+---------+ ICA Prox  52      18              calcific          Shadowing +----------+--------+--------+--------+------------------+---------+ ICA Distal45      17                                tortuous  +----------+--------+--------+--------+------------------+---------+ ECA       67      2                                           +----------+--------+--------+--------+------------------+---------+ +----------+--------+-------+--------+-------------------+           PSV cm/sEDV cmsDescribeArm Pressure (mmHG) +----------+--------+-------+--------+-------------------+ Subclavian80                                         +----------+--------+-------+--------+-------------------+ +---------+--------+--+--------+-+  VertebralPSV cm/s81EDV cm/s0 +---------+--------+--+--------+-+  Left Carotid Findings: +----------+--------+--------+--------+------------------+--------+           PSV cm/sEDV cm/sStenosisPlaque DescriptionComments +----------+--------+--------+--------+------------------+--------+ CCA Prox  56      12              heterogenous               +----------+--------+--------+--------+------------------+--------+ CCA Distal53      11              heterogenous               +----------+--------+--------+--------+------------------+--------+ ICA Prox  38      10              heterogenous               +----------+--------+--------+--------+------------------+--------+ ICA Distal48      9                                 tortuous +----------+--------+--------+--------+------------------+--------+ ECA       42      10                                         +----------+--------+--------+--------+------------------+--------+ +----------+--------+--------+--------+-------------------+           PSV cm/sEDV cm/sDescribeArm Pressure (mmHG)  +----------+--------+--------+--------+-------------------+ Subclavian109                                         +----------+--------+--------+--------+-------------------+ +---------+--------+--------+--------------+ VertebralPSV cm/sEDV cm/sNot identified +---------+--------+--------+--------------+   Summary: Right Carotid: Velocities in the right ICA are consistent with a 1-39% stenosis. Left Carotid: Velocities in the left ICA are consistent with a 1-39% stenosis. Vertebrals:  Right vertebral artery demonstrates antegrade flow. Left vertebral              artery was not visualized. Subclavians: Normal flow hemodynamics were seen in bilateral subclavian              arteries. *See table(s) above for measurements and observations.  Electronically signed by Antony Contras MD on 12/14/2020 at 1:11:49 PM.    Final    CT ANGIO HEAD NECK W WO CM W PERF (CODE STROKE)  Result Date: 12/13/2020 CLINICAL DATA:  88 year old male code stroke presentation. EXAM: CT ANGIOGRAPHY HEAD AND NECK CT PERFUSION BRAIN TECHNIQUE: Multidetector CT imaging of the head and neck was performed using the standard protocol during bolus administration of intravenous contrast. Multiplanar CT image reconstructions and MIPs were obtained to evaluate the vascular anatomy. Carotid stenosis measurements (when applicable) are obtained utilizing NASCET criteria, using the distal internal carotid diameter as the denominator. Multiphase CT imaging of the brain was performed following IV bolus contrast injection. Subsequent parametric perfusion maps were calculated using RAPID software. CONTRAST:  128mL OMNIPAQUE IOHEXOL 350 MG/ML SOLN COMPARISON:  Plain head CT 1034 hours today. FINDINGS: CT Brain Perfusion Findings: ASPECTS: 10 CBF (<30%) Volume: None Perfusion (Tmax>6.0s) volume: None Mismatch Volume: Not applicable Infarction Location:Not applicable CTA NECK Skeleton: Absent maxillary dentition. Widespread advanced cervical spine  degeneration. Mild T3 compression fracture, favor chronic. Upper chest: Patchy and confluent peribronchial and peripheral upper lung opacity bilaterally. No superior mediastinal lymphadenopathy. There are small postinflammatory calcified superior mediastinal nodes. Other neck: Negative. Aortic arch: Calcified  aortic atherosclerosis. Three vessel arch configuration. Right carotid system: Mild brachiocephalic and right CCA plaque and tortuosity without stenosis. Calcified plaque at the right ICA origin and bulb with less than 50 % stenosis with respect to the distal vessel. Mildly tortuous right ICA distal to the bulb. Left carotid system: Tortuous left CCA with mild calcified plaque and no stenosis proximal to the bifurcation. Mild to moderate left ICA origin and bulb calcified plaque but no significant stenosis. Mild tortuosity distal to the bulb. Vertebral arteries: Tortuous proximal subclavian artery with calcified plaque but no significant stenosis. Calcified plaque at the right vertebral artery origin results in mild to moderate stenosis. Patent right vertebral artery to the skull base without additional plaque or stenosis. Tortuous proximal left subclavian artery with calcified plaque and a kinked appearance at the thoracic inlet but no significant stenosis. Normal left vertebral artery origin. Mild left V1 segment tortuosity and plaque without stenosis. Mildly non dominant left vertebral artery is patent to the skull base. CTA HEAD Posterior circulation: Mildly dominant right V4 segment. Mild V4 calcified plaque without stenosis. Normal PICA origins. Patent vertebrobasilar junction and basilar artery without stenosis. SCA and PCA origins are normal. Right posterior communicating artery is present, the left is diminutive or absent. Right PCA branches are patent with moderate irregularity and stenosis in the P2 segment on series 13, image 24. Left MCA branches are irregular and attenuated distally, in the  setting of chronic left PCA infarct. Anterior circulation: Both ICA siphons are patent with tortuosity. Mild bilateral siphon calcified plaque without stenosis. Normal right posterior communicating artery origin. Patent carotid termini. Normal MCA and ACA origins. Mildly dominant right ACA A1. Normal anterior communicating artery. Bilateral ACA branches are within normal limits. Left MCA M1 segment, left MCA bifurcation and branches are within normal limits. Right MCA M1 segment and trifurcation are patent. Right MCA branches are within normal limits. Venous sinuses: Early contrast timing, not evaluated. Anatomic variants: Mildly dominant right vertebral artery. Review of the MIP images confirms the above findings IMPRESSION: 1. Negative for large vessel occlusion, and no core infarct or ischemic penumbra detected by CT Perfusion. 2. Atherosclerosis in the head and neck. Up to moderate associated stenosis at the Right Vertebral Artery origin. And moderate irregularity and stenosis Left > Right PCA P2 and distal branches, in the setting of chronic Left PCA infarct. 3. Bilateral upper lung pulmonary opacity suspicious for Bilateral Pneumonia. Consider viral/atypical etiology. 4. Aortic Atherosclerosis (ICD10-I70.0). Salient findings discussed by telephone with Dr. Cheral Marker on 12/13/2020 at 11:15. Electronically Signed   By: Genevie Ann M.D.   On: 12/13/2020 11:18     Discharge Exam: Vitals:   Jan 13, 2021 0407 2021/01/13 0837  BP: (!) 143/80 123/62  Pulse: (!) 138 (!) 155  Resp: (!) 29 (!) 26  Temp: (!) 103 F (39.4 C)   SpO2: 97% 95%   Vitals:   12/18/20 2028 01/13/2021 0009 13-Jan-2021 0407 January 13, 2021 0837  BP: (!) 141/88 (!) 146/68 (!) 143/80 123/62  Pulse: (!) 136 (!) 134 (!) 138 (!) 155  Resp: (!) 35 (!) 32 (!) 29 (!) 26  Temp: (!) 100.4 F (38 C) (!) 101.6 F (38.7 C) (!) 103 F (39.4 C)   TempSrc: Oral Oral Oral   SpO2: 94% 97% 97% 95%  Weight:      Height:        General: Pt is lethargic but  comfortable. Cardiovascular: Irregularly irregular rate and rhythm, S1/S2 +, no rubs, no gallops Respiratory: Diminished breath sounds  with some rhonchi. Abdominal: Soft, NT, ND, bowel sounds + Extremities: no edema,     The results of significant diagnostics from this hospitalization (including imaging, microbiology, ancillary and laboratory) are listed below for reference.     Microbiology: Recent Results (from the past 240 hour(s))  Resp Panel by RT-PCR (Flu A&B, Covid) Nasopharyngeal Swab     Status: None   Collection Time: 12/10/2020  2:00 PM   Specimen: Nasopharyngeal Swab; Nasopharyngeal(NP) swabs in vial transport medium  Result Value Ref Range Status   SARS Coronavirus 2 by RT PCR NEGATIVE NEGATIVE Final    Comment: (NOTE) SARS-CoV-2 target nucleic acids are NOT DETECTED.  The SARS-CoV-2 RNA is generally detectable in upper respiratory specimens during the acute phase of infection. The lowest concentration of SARS-CoV-2 viral copies this assay can detect is 138 copies/mL. A negative result does not preclude SARS-Cov-2 infection and should not be used as the sole basis for treatment or other patient management decisions. A negative result may occur with  improper specimen collection/handling, submission of specimen other than nasopharyngeal swab, presence of viral mutation(s) within the areas targeted by this assay, and inadequate number of viral copies(<138 copies/mL). A negative result must be combined with clinical observations, patient history, and epidemiological information. The expected result is Negative.  Fact Sheet for Patients:  EntrepreneurPulse.com.au  Fact Sheet for Healthcare Providers:  IncredibleEmployment.be  This test is no t yet approved or cleared by the [place] FDA and  has been authorized for detection and/or diagnosis of SARS-CoV-2 by FDA under an Emergency Use Authorization (EUA). This EUA will remain   in effect (meaning this test can be used) for the duration of the COVID-19 declaration under Section 564(b)(1) of the Act, 21 U.S.C.section 360bbb-3(b)(1), unless the authorization is terminated  or revoked sooner.       Influenza A by PCR NEGATIVE NEGATIVE Final   Influenza B by PCR NEGATIVE NEGATIVE Final    Comment: (NOTE) The Xpert Xpress SARS-CoV-2/FLU/RSV plus assay is intended as an aid in the diagnosis of influenza from Nasopharyngeal swab specimens and should not be used as a sole basis for treatment. Nasal washings and aspirates are unacceptable for Xpert Xpress SARS-CoV-2/FLU/RSV testing.  Fact Sheet for Patients: EntrepreneurPulse.com.au  Fact Sheet for Healthcare Providers: IncredibleEmployment.be  This test is not yet approved or cleared by the [place] FDA and has been authorized for detection and/or diagnosis of SARS-CoV-2 by FDA under an Emergency Use Authorization (EUA). This EUA will remain in effect (meaning this test can be used) for the duration of the COVID-19 declaration under Section 564(b)(1) of the Act, 21 U.S.C. section 360bbb-3(b)(1), unless the authorization is terminated or revoked.  Performed at [facility], [address], [place], [place] [address]   MRSA Next Gen by PCR, Nasal     Status: None   Collection Time: 12/13/20  3:01 PM   Specimen: Nasal Mucosa; Nasal Swab  Result Value Ref Range Status   MRSA by PCR Next Gen NOT DETECTED NOT DETECTED Final    Comment: (NOTE) The GeneXpert MRSA Assay (FDA approved for NASAL specimens only), is one component of a comprehensive MRSA colonization surveillance program. It is not intended to diagnose MRSA infection nor to guide or monitor treatment for MRSA infections. Test performance is not FDA approved in patients less than 50 years old. Performed at [facility], [place] [address], [place], [place] [address]      Labs: BNP  (last 3 results) Recent Labs  10/20/20 0728 12/14/2020 1400 12/14/20 0348  BNP 103.7* 231.3* 06.2   Basic Metabolic Panel: Recent Labs  Lab 12/12/20 1423 12/13/20 1250 12/14/20 0340 12/15/20 0207 12/16/20 0919 12/17/20 0831 12/18/20 0245  NA 137 132* 134* 129* 135 136 138  K 3.8 3.5 3.4* 4.1 4.1 3.9 3.7  CL 95* 91* 92* 89* 94* 95* 94*  CO2 33* 32 32 27 28 28 31   GLUCOSE 87 138* 93 188* 157* 134* 130*  BUN 12 19 22  24* 55* 79* 93*  CREATININE 1.17 1.19 1.25* 1.22 2.35* 2.33* 2.40*  CALCIUM 9.0 9.0 8.9 8.9 9.0 9.0 8.6*  MG 2.0 2.0  --   --  2.3 2.5*  --    Liver Function Tests: Recent Labs  Lab 12/13/20 1250 12/16/20 0919  AST 31 22  ALT 17 15  ALKPHOS 58 60  BILITOT 1.8* 0.8  PROT 7.8 8.1  ALBUMIN 3.1* 2.7*   No results for input(s): LIPASE, AMYLASE in the last 168 hours. No results for input(s): AMMONIA in the last 168 hours. CBC: Recent Labs  Lab 12/13/20 1250 12/14/20 0340 12/16/20 0919 12/17/20 0831 12/18/20 0245  WBC 12.3* 9.8 26.3* 20.6* 17.0*  NEUTROABS  --  8.0* 23.3* 18.4* 14.9*  HGB 13.8 13.4 13.9 13.1 13.5  HCT 42.9 42.5 43.8 41.7 42.7  MCV 88.5 89.5 89.8 90.1 90.5  PLT PLATELET CLUMPS NOTED ON SMEAR, UNABLE TO ESTIMATE PLATELET CLUMPS NOTED ON SMEAR, UNABLE TO ESTIMATE 215 PLATELET CLUMPS NOTED ON SMEAR, UNABLE TO ESTIMATE PLATELET CLUMPS NOTED ON SMEAR, UNABLE TO ESTIMATE   Cardiac Enzymes: No results for input(s): CKTOTAL, CKMB, CKMBINDEX, TROPONINI in the last 168 hours. BNP: Invalid input(s): POCBNP CBG: Recent Labs  Lab 12/13/20 1044 12/14/20 0728  GLUCAP 166* 80   D-Dimer No results for input(s): DDIMER in the last 72 hours. Hgb A1c No results for input(s): HGBA1C in the last 72 hours. Lipid Profile No results for input(s): CHOL, HDL, LDLCALC, TRIG, CHOLHDL, LDLDIRECT in the last 72 hours. Thyroid function studies No results for input(s): TSH, T4TOTAL, T3FREE, THYROIDAB in the last 72 hours.  Invalid input(s):  FREET3 Anemia work up No results for input(s): VITAMINB12, FOLATE, FERRITIN, TIBC, IRON, RETICCTPCT in the last 72 hours. Urinalysis    Component Value Date/Time   COLORURINE YELLOW 10/13/2020 1950   APPEARANCEUR CLEAR 10/13/2020 1950   LABSPEC 1.016 10/13/2020 1950   PHURINE 6.0 10/13/2020 1950   GLUCOSEU NEGATIVE 10/13/2020 1950   HGBUR SMALL (A) 10/13/2020 1950   BILIRUBINUR NEGATIVE 10/13/2020 [place] NEGATIVE 10/13/2020 1950   PROTEINUR 100 (A) 10/13/2020 1950   NITRITE NEGATIVE 10/13/2020 1950   LEUKOCYTESUR NEGATIVE 10/13/2020 1950   Sepsis Labs Invalid input(s): PROCALCITONIN,  WBC,  LACTICIDVEN Microbiology Recent Results (from the past 240 hour(s))  Resp Panel by RT-PCR (Flu A&B, Covid) Nasopharyngeal Swab     Status: None   Collection Time: 12/16/2020  2:00 PM   Specimen: Nasopharyngeal Swab; Nasopharyngeal(NP) swabs in vial transport medium  Result Value Ref Range Status   SARS Coronavirus 2 by RT PCR NEGATIVE NEGATIVE Final    Comment: (NOTE) SARS-CoV-2 target nucleic acids are NOT DETECTED.  The SARS-CoV-2 RNA is generally detectable in upper respiratory specimens during the acute phase of infection. The lowest concentration of SARS-CoV-2 viral copies this assay can detect is 138 copies/mL. A negative result does not preclude SARS-Cov-2 infection and should not be used as the sole basis for treatment or other patient management decisions. A negative result may  occur with  improper specimen collection/handling, submission of specimen other than nasopharyngeal swab, presence of viral mutation(s) within the areas targeted by this assay, and inadequate number of viral copies(<138 copies/mL). A negative result must be combined with clinical observations, patient history, and epidemiological information. The expected result is Negative.  Fact Sheet for Patients:  EntrepreneurPulse.com.au  Fact Sheet for Healthcare Providers:   IncredibleEmployment.be  This test is no t yet approved or cleared by the [place] FDA and  has been authorized for detection and/or diagnosis of SARS-CoV-2 by FDA under an Emergency Use Authorization (EUA). This EUA will remain  in effect (meaning this test can be used) for the duration of the COVID-19 declaration under Section 564(b)(1) of the Act, 21 U.S.C.section 360bbb-3(b)(1), unless the authorization is terminated  or revoked sooner.       Influenza A by PCR NEGATIVE NEGATIVE Final   Influenza B by PCR NEGATIVE NEGATIVE Final    Comment: (NOTE) The Xpert Xpress SARS-CoV-2/FLU/RSV plus assay is intended as an aid in the diagnosis of influenza from Nasopharyngeal swab specimens and should not be used as a sole basis for treatment. Nasal washings and aspirates are unacceptable for Xpert Xpress SARS-CoV-2/FLU/RSV testing.  Fact Sheet for Patients: EntrepreneurPulse.com.au  Fact Sheet for Healthcare Providers: IncredibleEmployment.be  This test is not yet approved or cleared by the [place] FDA and has been authorized for detection and/or diagnosis of SARS-CoV-2 by FDA under an Emergency Use Authorization (EUA). This EUA will remain in effect (meaning this test can be used) for the duration of the COVID-19 declaration under Section 564(b)(1) of the Act, 21 U.S.C. section 360bbb-3(b)(1), unless the authorization is terminated or revoked.  Performed at [facility], [address] [place], [place] [address]   MRSA Next Gen by PCR, Nasal     Status: None   Collection Time: 12/13/20  3:01 PM   Specimen: Nasal Mucosa; Nasal Swab  Result Value Ref Range Status   MRSA by PCR Next Gen NOT DETECTED NOT DETECTED Final    Comment: (NOTE) The GeneXpert MRSA Assay (FDA approved for NASAL specimens only), is one component of a comprehensive MRSA colonization surveillance program. It is not intended  to diagnose MRSA infection nor to guide or monitor treatment for MRSA infections. Test performance is not FDA approved in patients less than 4 years old. Performed at [facility], [place] [address], [place], [place] [address]      Time coordinating discharge: Over 30 minutes  SIGNED:   Darliss Cheney, MD  [facility] 12/25/20, 11:09 AM  If 7PM-7AM, please contact night-coverage [email or web address]

## 2021-01-10 NOTE — TOC Transition Note (Addendum)
Transition of Care [facility]) - CM/SW Discharge Note   Patient Details  Name: [name] MRN: [id number] Date of Birth: 1930-02-02  [facility] CM/SW Contact:  Carles Collet, RN Phone Number: 01/11/21, 10:01 AM   Clinical Narrative:    CAlled home number to speak with spouse or one of the sons listed below. Deferred to call hospital room. Spoke w grandson Merrilee Seashore who confirmed that equipment including home oxygen had been delivered to the home. Also confirmed with family at bedside that all DME is in the home. Transportation set up with [facility]. DNR and [facility] forms on chart.  Messaged nurse that [facility] has been called Notified [facility] [place] that patient will DC this AM.    BOSS, DANIELSEN [phone]    Al[place], Botts [phone]    Kenneth,Charles Son [place] Spouse   [phone]      Final next level of care: Home w Hospice Care Barriers to Discharge: No Barriers Identified   Patient Goals and CMS Choice   CMS Medicare.gov Compare Post Acute Care list provided to:: Patient Represenative (must comment)    Discharge Placement                       Discharge Plan and Services                            [place] Date [place]: 01-11-21 Time [place]: [phone] Representative spoke with at [place]: [place] Determinants of Health ([place]) Interventions     Readmission Risk Interventions No flowsheet data found.

## 2021-01-10 NOTE — Progress Notes (Signed)
Pt wheeled off unit by [facility]. Both Iv's removed. Medication instructions given to the family.

## 2021-01-10 DEATH — deceased

## 2021-03-01 ENCOUNTER — Ambulatory Visit: Payer: Medicare Other | Admitting: Interventional Cardiology

## 2022-11-19 IMAGING — CR DG SHOULDER 2+V*L*
3 series · 3 of 3 positions shown · non-contrast
Comparison: 03/13/2018

CLINICAL DATA: Left shoulder pain, chronic, worsening over the last
2 weeks, no known injury

EXAM:
LEFT SHOULDER - 2+ VIEW

[shoulder grashey]
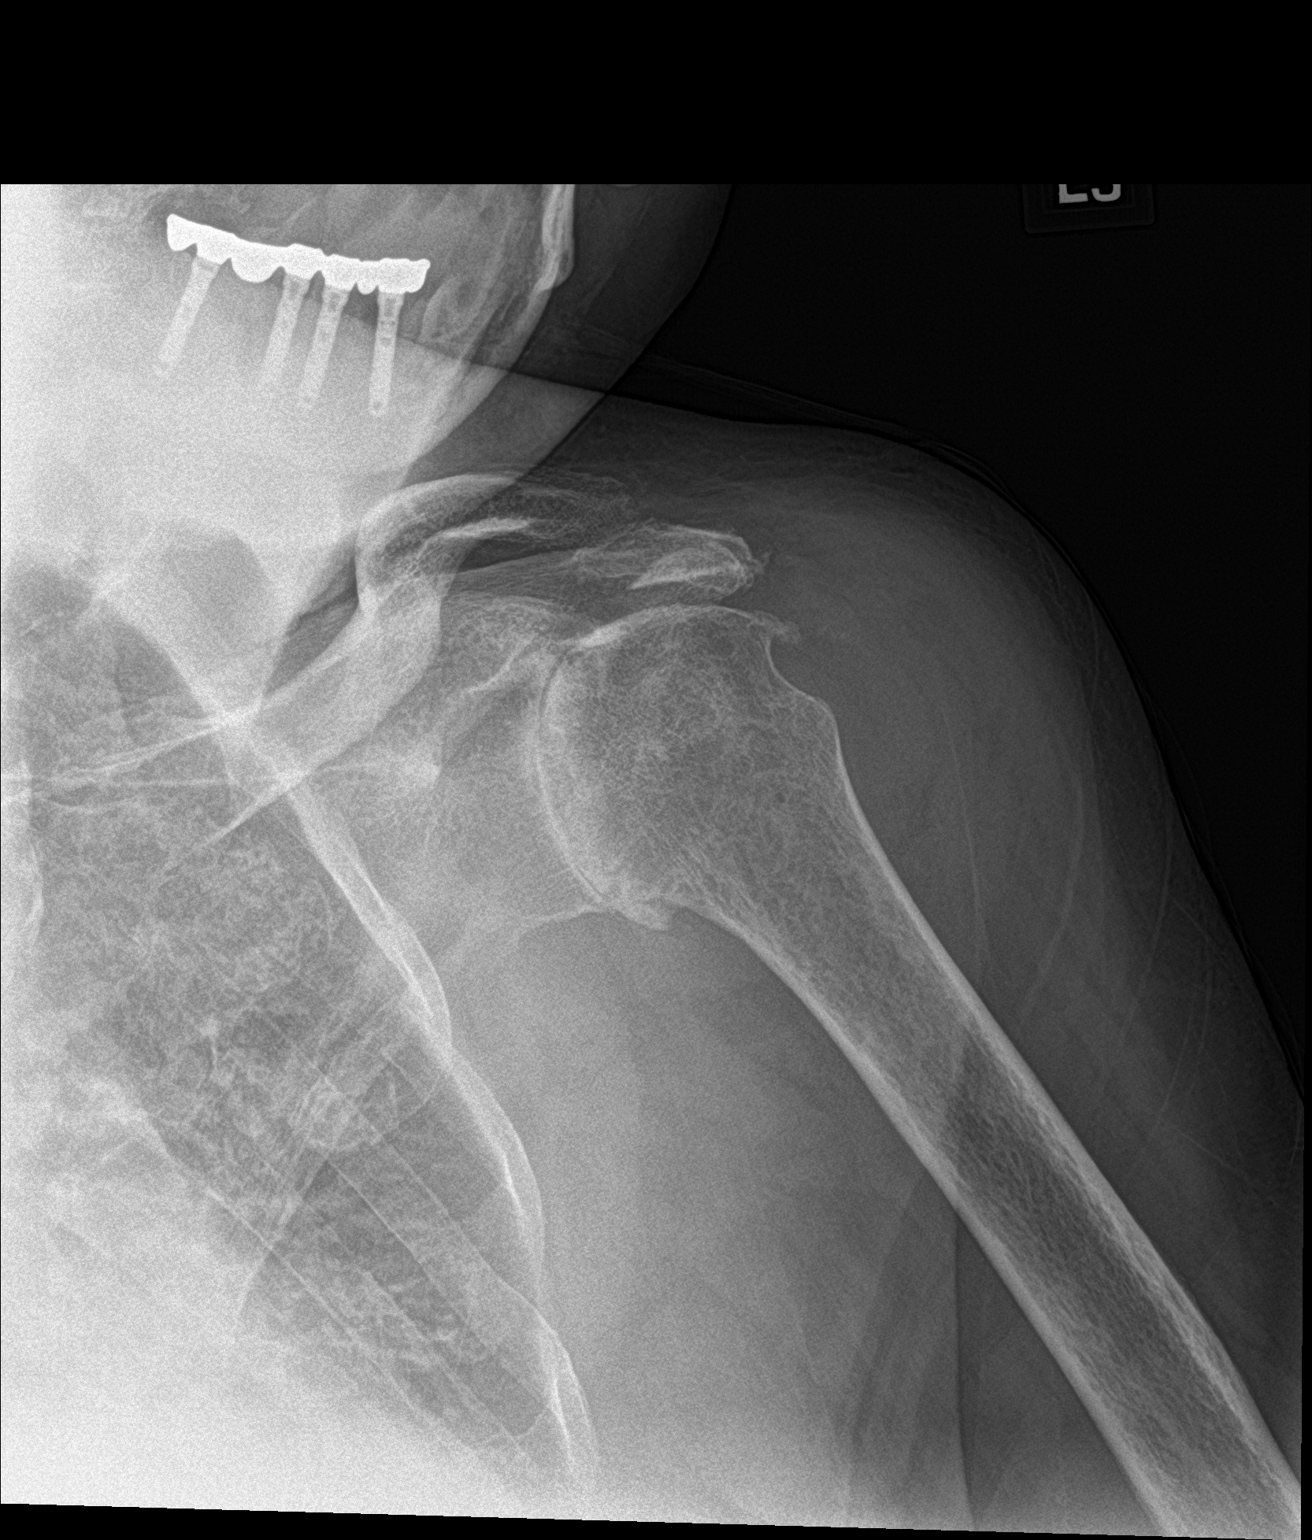

[shoulder y view]
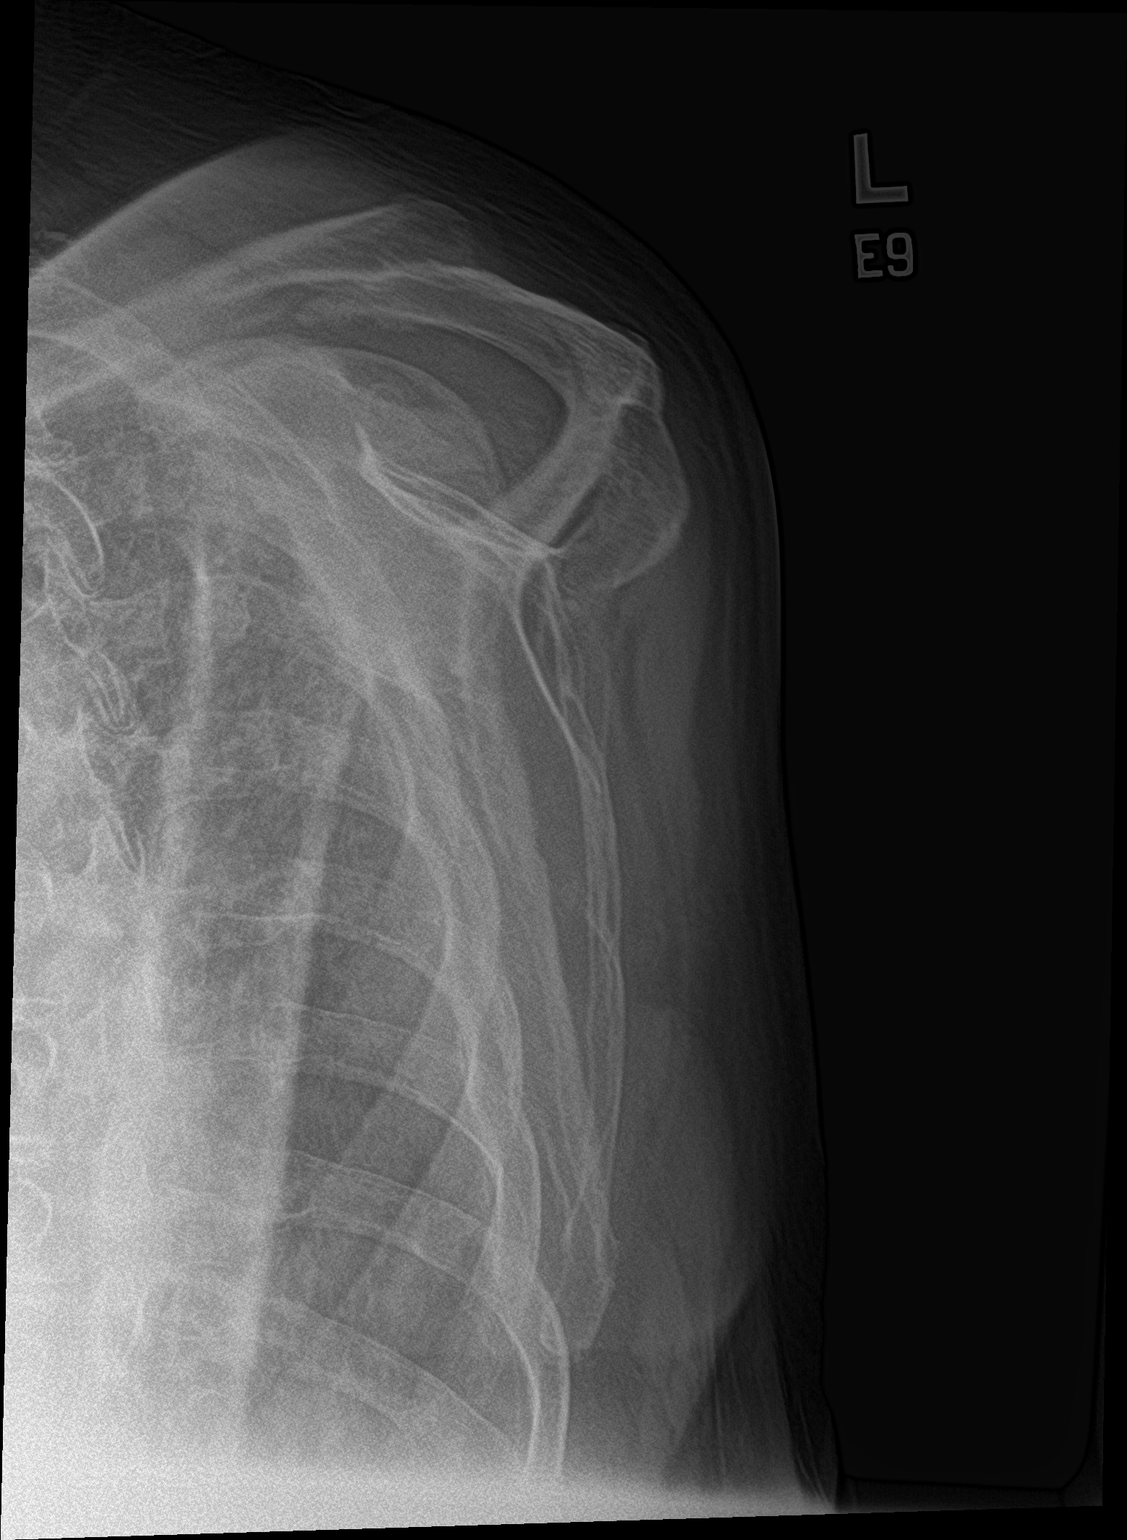

[shoulder ap neutral]
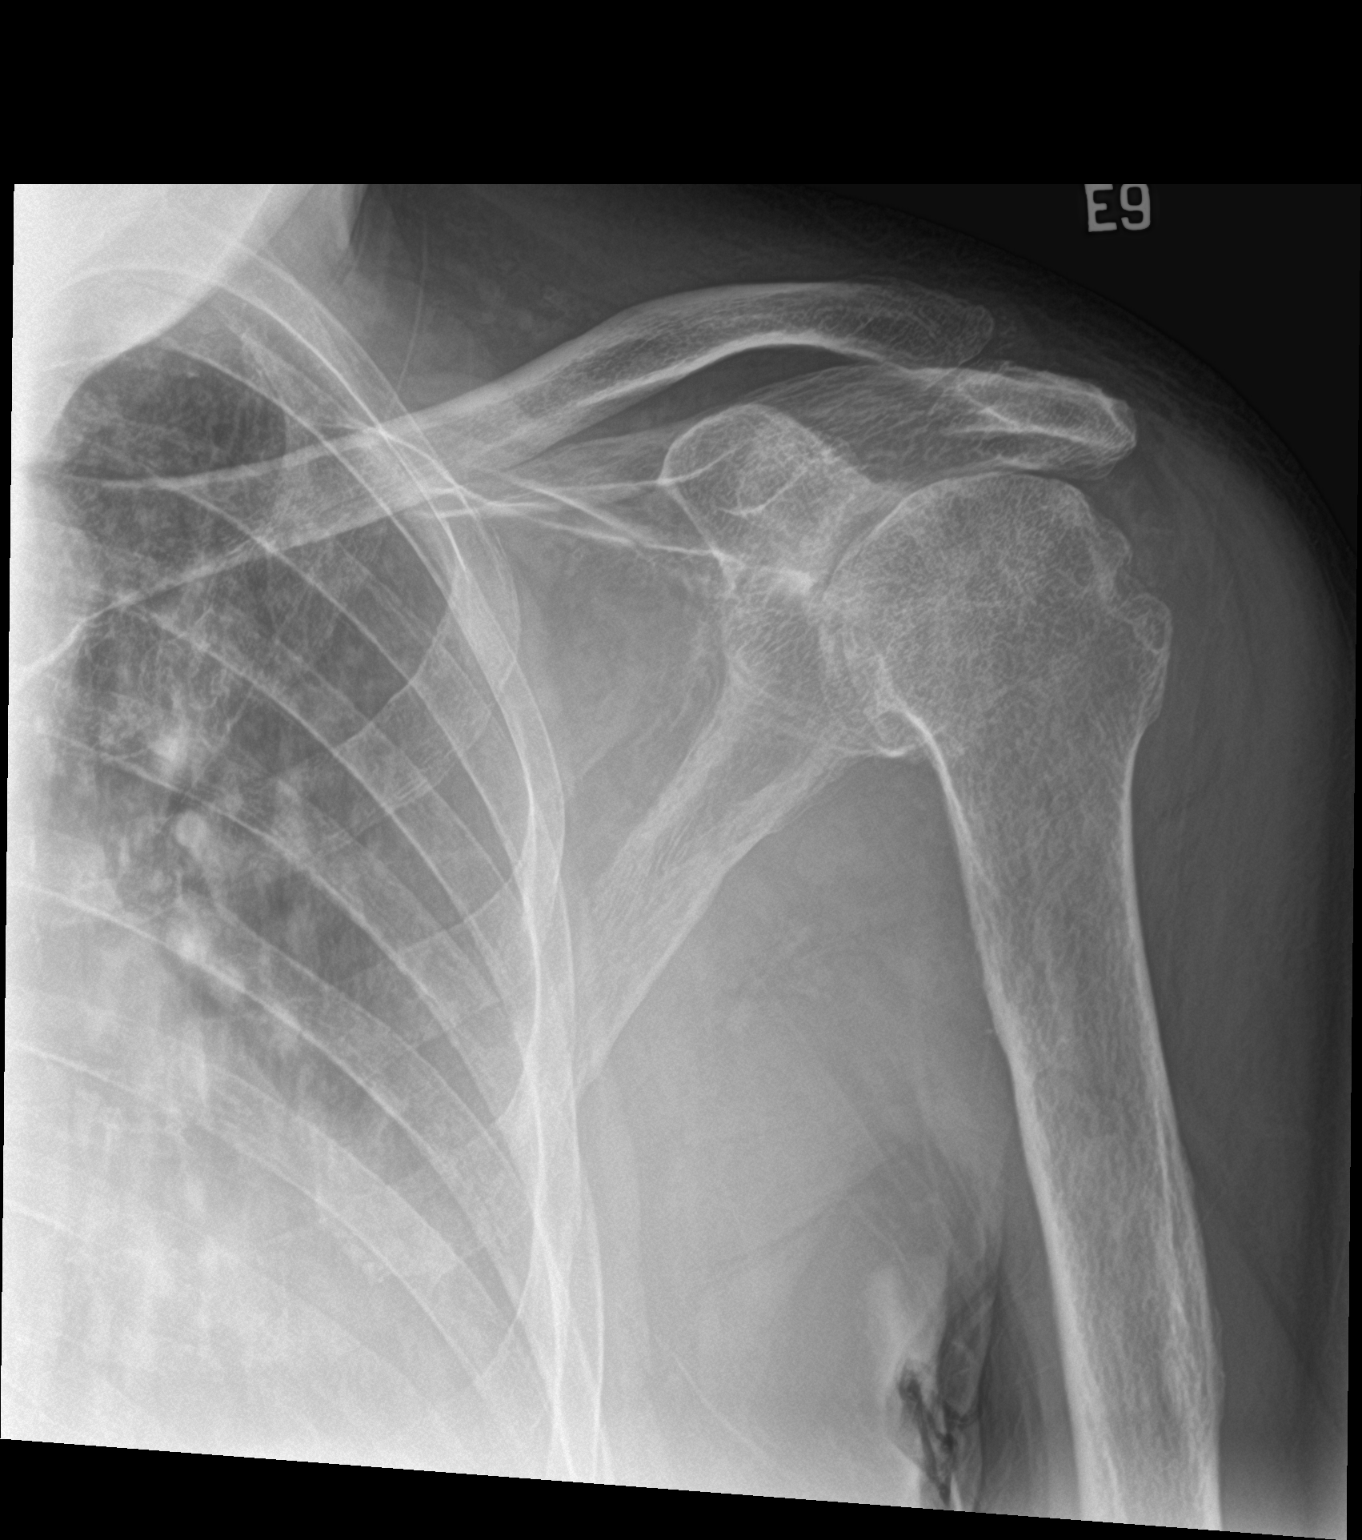

[3 of 3 positions shown; findings below may reference images not displayed]

FINDINGS: No fracture or dislocation of the left shoulder. There is severe,
bone-on-bone glenohumeral arthrosis. Mild acromioclavicular
arthrosis. The partially imaged left chest is unremarkable.
IMPRESSION: 1. No fracture or dislocation of the left shoulder.
2. There is severe, bone-on-bone glenohumeral arthrosis. Mild
acromioclavicular arthrosis.
